# Patient Record
Sex: Male | Born: 1965 | Race: White | Hispanic: No | Smoking: Current every day smoker
Health system: Southern US, Community
[De-identification: ages and names within clinical notes are randomized; demographics above are authoritative.]

## PROBLEM LIST (undated history)

## (undated) DIAGNOSIS — I469 Cardiac arrest, cause unspecified: Secondary | ICD-10-CM

## (undated) DIAGNOSIS — I739 Peripheral vascular disease, unspecified: Secondary | ICD-10-CM

## (undated) DIAGNOSIS — F1010 Alcohol abuse, uncomplicated: Secondary | ICD-10-CM

## (undated) DIAGNOSIS — G7000 Myasthenia gravis without (acute) exacerbation: Secondary | ICD-10-CM

## (undated) DIAGNOSIS — K922 Gastrointestinal hemorrhage, unspecified: Secondary | ICD-10-CM

## (undated) DIAGNOSIS — D696 Thrombocytopenia, unspecified: Secondary | ICD-10-CM

## (undated) DIAGNOSIS — F1410 Cocaine abuse, uncomplicated: Secondary | ICD-10-CM

## (undated) DIAGNOSIS — K7200 Acute and subacute hepatic failure without coma: Secondary | ICD-10-CM

## (undated) DIAGNOSIS — K219 Gastro-esophageal reflux disease without esophagitis: Secondary | ICD-10-CM

## (undated) DIAGNOSIS — D649 Anemia, unspecified: Secondary | ICD-10-CM

## (undated) DIAGNOSIS — K2980 Duodenitis without bleeding: Secondary | ICD-10-CM

## (undated) DIAGNOSIS — E876 Hypokalemia: Secondary | ICD-10-CM

## (undated) DIAGNOSIS — I2510 Atherosclerotic heart disease of native coronary artery without angina pectoris: Secondary | ICD-10-CM

## (undated) DIAGNOSIS — I4901 Ventricular fibrillation: Secondary | ICD-10-CM

## (undated) DIAGNOSIS — I509 Heart failure, unspecified: Secondary | ICD-10-CM

## (undated) DIAGNOSIS — F1910 Other psychoactive substance abuse, uncomplicated: Secondary | ICD-10-CM

## (undated) DIAGNOSIS — G4730 Sleep apnea, unspecified: Secondary | ICD-10-CM

## (undated) DIAGNOSIS — D509 Iron deficiency anemia, unspecified: Secondary | ICD-10-CM

## (undated) DIAGNOSIS — I219 Acute myocardial infarction, unspecified: Secondary | ICD-10-CM

## (undated) DIAGNOSIS — E785 Hyperlipidemia, unspecified: Secondary | ICD-10-CM

## (undated) DIAGNOSIS — Z9289 Personal history of other medical treatment: Secondary | ICD-10-CM

## (undated) DIAGNOSIS — I255 Ischemic cardiomyopathy: Secondary | ICD-10-CM

## (undated) DIAGNOSIS — N179 Acute kidney failure, unspecified: Secondary | ICD-10-CM

## (undated) DIAGNOSIS — Z720 Tobacco use: Secondary | ICD-10-CM

## (undated) DIAGNOSIS — J449 Chronic obstructive pulmonary disease, unspecified: Secondary | ICD-10-CM

## (undated) DIAGNOSIS — K5520 Angiodysplasia of colon without hemorrhage: Secondary | ICD-10-CM

## (undated) HISTORY — PX: CORONARY ANGIOPLASTY: SHX604

## (undated) HISTORY — PX: ATHERECTOMY: SHX47

## (undated) HISTORY — PX: FEMORAL ARTERY - POPLITEAL ARTERY BYPASS GRAFT: SUR180

---

## 2018-04-26 DIAGNOSIS — D696 Thrombocytopenia, unspecified: Secondary | ICD-10-CM

## 2018-04-26 HISTORY — DX: Thrombocytopenia, unspecified: D69.6

## 2018-05-22 ENCOUNTER — Encounter (HOSPITAL_COMMUNITY)
Admission: EM | Disposition: A | Discharge status: Home Health | Payer: Self-pay | Source: Home / Self Care | Attending: Cardiovascular Disease

## 2018-05-22 ENCOUNTER — Inpatient Hospital Stay (HOSPITAL_COMMUNITY): Payer: No Typology Code available for payment source

## 2018-05-22 ENCOUNTER — Inpatient Hospital Stay (HOSPITAL_COMMUNITY)
Admission: EM | Admit: 2018-05-22 | Discharge: 2018-06-21 | DRG: 246 | Disposition: A | Discharge status: Home Health | Payer: No Typology Code available for payment source | Attending: Internal Medicine | Admitting: Internal Medicine

## 2018-05-22 ENCOUNTER — Emergency Department (HOSPITAL_COMMUNITY): Payer: No Typology Code available for payment source

## 2018-05-22 DIAGNOSIS — R34 Anuria and oliguria: Secondary | ICD-10-CM | POA: Diagnosis not present

## 2018-05-22 DIAGNOSIS — E875 Hyperkalemia: Secondary | ICD-10-CM | POA: Diagnosis not present

## 2018-05-22 DIAGNOSIS — I5041 Acute combined systolic (congestive) and diastolic (congestive) heart failure: Secondary | ICD-10-CM | POA: Diagnosis not present

## 2018-05-22 DIAGNOSIS — I2109 ST elevation (STEMI) myocardial infarction involving other coronary artery of anterior wall: Secondary | ICD-10-CM | POA: Diagnosis present

## 2018-05-22 DIAGNOSIS — F17210 Nicotine dependence, cigarettes, uncomplicated: Secondary | ICD-10-CM | POA: Diagnosis present

## 2018-05-22 DIAGNOSIS — G9340 Encephalopathy, unspecified: Secondary | ICD-10-CM

## 2018-05-22 DIAGNOSIS — Z681 Body mass index (BMI) 19 or less, adult: Secondary | ICD-10-CM | POA: Diagnosis not present

## 2018-05-22 DIAGNOSIS — N2581 Secondary hyperparathyroidism of renal origin: Secondary | ICD-10-CM | POA: Diagnosis not present

## 2018-05-22 DIAGNOSIS — I729 Aneurysm of unspecified site: Secondary | ICD-10-CM | POA: Diagnosis not present

## 2018-05-22 DIAGNOSIS — E785 Hyperlipidemia, unspecified: Secondary | ICD-10-CM

## 2018-05-22 DIAGNOSIS — E43 Unspecified severe protein-calorie malnutrition: Secondary | ICD-10-CM | POA: Diagnosis not present

## 2018-05-22 DIAGNOSIS — J9601 Acute respiratory failure with hypoxia: Secondary | ICD-10-CM | POA: Diagnosis present

## 2018-05-22 DIAGNOSIS — E874 Mixed disorder of acid-base balance: Secondary | ICD-10-CM | POA: Diagnosis present

## 2018-05-22 DIAGNOSIS — I132 Hypertensive heart and chronic kidney disease with heart failure and with stage 5 chronic kidney disease, or end stage renal disease: Secondary | ICD-10-CM | POA: Diagnosis present

## 2018-05-22 DIAGNOSIS — D6489 Other specified anemias: Secondary | ICD-10-CM | POA: Diagnosis not present

## 2018-05-22 DIAGNOSIS — R059 Cough, unspecified: Secondary | ICD-10-CM

## 2018-05-22 DIAGNOSIS — I2510 Atherosclerotic heart disease of native coronary artery without angina pectoris: Secondary | ICD-10-CM | POA: Diagnosis present

## 2018-05-22 DIAGNOSIS — G7000 Myasthenia gravis without (acute) exacerbation: Secondary | ICD-10-CM | POA: Diagnosis present

## 2018-05-22 DIAGNOSIS — N170 Acute kidney failure with tubular necrosis: Secondary | ICD-10-CM | POA: Diagnosis present

## 2018-05-22 DIAGNOSIS — R64 Cachexia: Secondary | ICD-10-CM | POA: Diagnosis not present

## 2018-05-22 DIAGNOSIS — R570 Cardiogenic shock: Secondary | ICD-10-CM | POA: Diagnosis present

## 2018-05-22 DIAGNOSIS — Z781 Physical restraint status: Secondary | ICD-10-CM

## 2018-05-22 DIAGNOSIS — E162 Hypoglycemia, unspecified: Secondary | ICD-10-CM | POA: Diagnosis not present

## 2018-05-22 DIAGNOSIS — J1520 Pneumonia due to staphylococcus, unspecified: Secondary | ICD-10-CM | POA: Diagnosis present

## 2018-05-22 DIAGNOSIS — F10239 Alcohol dependence with withdrawal, unspecified: Secondary | ICD-10-CM | POA: Diagnosis not present

## 2018-05-22 DIAGNOSIS — R05 Cough: Secondary | ICD-10-CM

## 2018-05-22 DIAGNOSIS — I2102 ST elevation (STEMI) myocardial infarction involving left anterior descending coronary artery: Principal | ICD-10-CM | POA: Diagnosis present

## 2018-05-22 DIAGNOSIS — I213 ST elevation (STEMI) myocardial infarction of unspecified site: Secondary | ICD-10-CM

## 2018-05-22 DIAGNOSIS — I249 Acute ischemic heart disease, unspecified: Secondary | ICD-10-CM | POA: Diagnosis present

## 2018-05-22 DIAGNOSIS — F1423 Cocaine dependence with withdrawal: Secondary | ICD-10-CM | POA: Diagnosis not present

## 2018-05-22 DIAGNOSIS — D62 Acute posthemorrhagic anemia: Secondary | ICD-10-CM | POA: Diagnosis not present

## 2018-05-22 DIAGNOSIS — Z452 Encounter for adjustment and management of vascular access device: Secondary | ICD-10-CM

## 2018-05-22 DIAGNOSIS — Z992 Dependence on renal dialysis: Secondary | ICD-10-CM

## 2018-05-22 DIAGNOSIS — R7989 Other specified abnormal findings of blood chemistry: Secondary | ICD-10-CM

## 2018-05-22 DIAGNOSIS — I255 Ischemic cardiomyopathy: Secondary | ICD-10-CM

## 2018-05-22 DIAGNOSIS — N179 Acute kidney failure, unspecified: Secondary | ICD-10-CM

## 2018-05-22 DIAGNOSIS — E861 Hypovolemia: Secondary | ICD-10-CM | POA: Diagnosis present

## 2018-05-22 DIAGNOSIS — R52 Pain, unspecified: Secondary | ICD-10-CM

## 2018-05-22 DIAGNOSIS — G9341 Metabolic encephalopathy: Secondary | ICD-10-CM | POA: Diagnosis not present

## 2018-05-22 DIAGNOSIS — G931 Anoxic brain damage, not elsewhere classified: Secondary | ICD-10-CM | POA: Diagnosis present

## 2018-05-22 DIAGNOSIS — A419 Sepsis, unspecified organism: Secondary | ICD-10-CM | POA: Diagnosis not present

## 2018-05-22 DIAGNOSIS — I4901 Ventricular fibrillation: Secondary | ICD-10-CM | POA: Diagnosis not present

## 2018-05-22 DIAGNOSIS — R739 Hyperglycemia, unspecified: Secondary | ICD-10-CM | POA: Diagnosis present

## 2018-05-22 DIAGNOSIS — N19 Unspecified kidney failure: Secondary | ICD-10-CM

## 2018-05-22 DIAGNOSIS — D684 Acquired coagulation factor deficiency: Secondary | ICD-10-CM | POA: Diagnosis not present

## 2018-05-22 DIAGNOSIS — J440 Chronic obstructive pulmonary disease with acute lower respiratory infection: Secondary | ICD-10-CM | POA: Diagnosis present

## 2018-05-22 DIAGNOSIS — R195 Other fecal abnormalities: Secondary | ICD-10-CM

## 2018-05-22 DIAGNOSIS — J9600 Acute respiratory failure, unspecified whether with hypoxia or hypercapnia: Secondary | ICD-10-CM

## 2018-05-22 DIAGNOSIS — I469 Cardiac arrest, cause unspecified: Secondary | ICD-10-CM

## 2018-05-22 DIAGNOSIS — R6521 Severe sepsis with septic shock: Secondary | ICD-10-CM | POA: Diagnosis not present

## 2018-05-22 DIAGNOSIS — T508X5A Adverse effect of diagnostic agents, initial encounter: Secondary | ICD-10-CM | POA: Diagnosis not present

## 2018-05-22 DIAGNOSIS — D6959 Other secondary thrombocytopenia: Secondary | ICD-10-CM | POA: Diagnosis not present

## 2018-05-22 DIAGNOSIS — I340 Nonrheumatic mitral (valve) insufficiency: Secondary | ICD-10-CM | POA: Diagnosis not present

## 2018-05-22 DIAGNOSIS — Z01810 Encounter for preprocedural cardiovascular examination: Secondary | ICD-10-CM | POA: Diagnosis not present

## 2018-05-22 DIAGNOSIS — Z01818 Encounter for other preprocedural examination: Secondary | ICD-10-CM

## 2018-05-22 DIAGNOSIS — N185 Chronic kidney disease, stage 5: Secondary | ICD-10-CM | POA: Diagnosis not present

## 2018-05-22 DIAGNOSIS — D696 Thrombocytopenia, unspecified: Secondary | ICD-10-CM

## 2018-05-22 DIAGNOSIS — K449 Diaphragmatic hernia without obstruction or gangrene: Secondary | ICD-10-CM | POA: Diagnosis present

## 2018-05-22 DIAGNOSIS — R471 Dysarthria and anarthria: Secondary | ICD-10-CM | POA: Diagnosis present

## 2018-05-22 DIAGNOSIS — R9401 Abnormal electroencephalogram [EEG]: Secondary | ICD-10-CM | POA: Diagnosis present

## 2018-05-22 DIAGNOSIS — T380X5A Adverse effect of glucocorticoids and synthetic analogues, initial encounter: Secondary | ICD-10-CM | POA: Diagnosis not present

## 2018-05-22 DIAGNOSIS — Z9289 Personal history of other medical treatment: Secondary | ICD-10-CM

## 2018-05-22 DIAGNOSIS — J189 Pneumonia, unspecified organism: Secondary | ICD-10-CM

## 2018-05-22 DIAGNOSIS — K7200 Acute and subacute hepatic failure without coma: Secondary | ICD-10-CM | POA: Diagnosis not present

## 2018-05-22 DIAGNOSIS — R579 Shock, unspecified: Secondary | ICD-10-CM

## 2018-05-22 DIAGNOSIS — I252 Old myocardial infarction: Secondary | ICD-10-CM

## 2018-05-22 DIAGNOSIS — F1410 Cocaine abuse, uncomplicated: Secondary | ICD-10-CM

## 2018-05-22 DIAGNOSIS — R945 Abnormal results of liver function studies: Secondary | ICD-10-CM

## 2018-05-22 DIAGNOSIS — K2980 Duodenitis without bleeding: Secondary | ICD-10-CM | POA: Diagnosis not present

## 2018-05-22 DIAGNOSIS — K921 Melena: Secondary | ICD-10-CM

## 2018-05-22 DIAGNOSIS — D500 Iron deficiency anemia secondary to blood loss (chronic): Secondary | ICD-10-CM

## 2018-05-22 DIAGNOSIS — E876 Hypokalemia: Secondary | ICD-10-CM | POA: Diagnosis not present

## 2018-05-22 DIAGNOSIS — I219 Acute myocardial infarction, unspecified: Secondary | ICD-10-CM

## 2018-05-22 DIAGNOSIS — T82528A Displacement of other cardiac and vascular devices and implants, initial encounter: Secondary | ICD-10-CM

## 2018-05-22 DIAGNOSIS — J9690 Respiratory failure, unspecified, unspecified whether with hypoxia or hypercapnia: Secondary | ICD-10-CM

## 2018-05-22 DIAGNOSIS — F1910 Other psychoactive substance abuse, uncomplicated: Secondary | ICD-10-CM

## 2018-05-22 DIAGNOSIS — I080 Rheumatic disorders of both mitral and aortic valves: Secondary | ICD-10-CM | POA: Diagnosis present

## 2018-05-22 HISTORY — DX: Cardiac arrest, cause unspecified: I46.9

## 2018-05-22 HISTORY — PX: CARDIAC CATHETERIZATION: SHX172

## 2018-05-22 HISTORY — PX: LEFT HEART CATH AND CORONARY ANGIOGRAPHY: CATH118249

## 2018-05-22 HISTORY — DX: Acute myocardial infarction, unspecified: I21.9

## 2018-05-22 HISTORY — PX: CORONARY STENT INTERVENTION: CATH118234

## 2018-05-22 LAB — POCT I-STAT 3, ART BLOOD GAS (G3+)
ACID-BASE DEFICIT: 8 mmol/L — AB (ref 0.0–2.0)
BICARBONATE: 17.7 mmol/L — AB (ref 20.0–28.0)
O2 Saturation: 86 %
PH ART: 7.292 — AB (ref 7.350–7.450)
TCO2: 19 mmol/L — AB (ref 22–32)
pCO2 arterial: 36.3 mmHg (ref 32.0–48.0)
pO2, Arterial: 54 mmHg — ABNORMAL LOW (ref 83.0–108.0)

## 2018-05-22 LAB — CBC
HEMATOCRIT: 42.1 % (ref 39.0–52.0)
Hemoglobin: 13 g/dL (ref 13.0–17.0)
MCH: 27 pg (ref 26.0–34.0)
MCHC: 30.9 g/dL (ref 30.0–36.0)
MCV: 87.5 fL (ref 78.0–100.0)
PLATELETS: 341 10*3/uL (ref 150–400)
RBC: 4.81 MIL/uL (ref 4.22–5.81)
RDW: 18.8 % — AB (ref 11.5–15.5)
WBC: 14.7 10*3/uL — AB (ref 4.0–10.5)

## 2018-05-22 LAB — URINALYSIS, ROUTINE W REFLEX MICROSCOPIC
BILIRUBIN URINE: NEGATIVE
Bacteria, UA: NONE SEEN
GLUCOSE, UA: NEGATIVE mg/dL
Ketones, ur: NEGATIVE mg/dL
Leukocytes, UA: NEGATIVE
Nitrite: NEGATIVE
PROTEIN: 30 mg/dL — AB
Specific Gravity, Urine: >1.046 — ABNORMAL HIGH (ref 1.005–1.030)
pH: 5 (ref 5.0–8.0)

## 2018-05-22 LAB — LIPID PANEL
Cholesterol: 125 mg/dL (ref 0–200)
HDL: 40 mg/dL — ABNORMAL LOW
LDL Cholesterol: 74 mg/dL (ref 0–99)
Total CHOL/HDL Ratio: 3.1 ratio
Triglycerides: 57 mg/dL
VLDL: 11 mg/dL (ref 0–40)

## 2018-05-22 LAB — BASIC METABOLIC PANEL WITH GFR
Anion gap: 16 — ABNORMAL HIGH (ref 5–15)
BUN: 18 mg/dL (ref 6–20)
CO2: 14 mmol/L — ABNORMAL LOW (ref 22–32)
Calcium: 8.2 mg/dL — ABNORMAL LOW (ref 8.9–10.3)
Chloride: 109 mmol/L (ref 98–111)
Creatinine, Ser: 1.23 mg/dL (ref 0.61–1.24)
GFR calc Af Amer: 30 mL/min — ABNORMAL LOW
GFR calc non Af Amer: 26 mL/min — ABNORMAL LOW
Glucose, Bld: 186 mg/dL — ABNORMAL HIGH (ref 70–99)
Potassium: 4.5 mmol/L (ref 3.5–5.1)
Sodium: 139 mmol/L (ref 135–145)

## 2018-05-22 LAB — MRSA PCR SCREENING: MRSA by PCR: NEGATIVE

## 2018-05-22 LAB — BASIC METABOLIC PANEL
Anion gap: 11 (ref 5–15)
BUN: 20 mg/dL (ref 6–20)
CALCIUM: 8.3 mg/dL — AB (ref 8.9–10.3)
CO2: 19 mmol/L — ABNORMAL LOW (ref 22–32)
Chloride: 108 mmol/L (ref 98–111)
Creatinine, Ser: 0.99 mg/dL (ref 0.61–1.24)
GFR calc Af Amer: 38 mL/min — ABNORMAL LOW (ref >60)
GFR, EST NON AFRICAN AMERICAN: 33 mL/min — AB (ref >60)
GLUCOSE: 188 mg/dL — AB (ref 70–99)
POTASSIUM: 3.9 mmol/L (ref 3.5–5.1)
SODIUM: 138 mmol/L (ref 135–145)

## 2018-05-22 LAB — POCT I-STAT, CHEM 8
BUN: 21 mg/dL — ABNORMAL HIGH (ref 6–20)
CALCIUM ION: 1.19 mmol/L (ref 1.15–1.40)
CHLORIDE: 107 mmol/L (ref 98–111)
Creatinine, Ser: 0.9 mg/dL (ref 0.61–1.24)
GLUCOSE: 187 mg/dL — AB (ref 70–99)
HCT: 46 % (ref 39.0–52.0)
Hemoglobin: 15.6 g/dL (ref 13.0–17.0)
Potassium: 4.3 mmol/L (ref 3.5–5.1)
SODIUM: 140 mmol/L (ref 135–145)
TCO2: 19 mmol/L — ABNORMAL LOW (ref 22–32)

## 2018-05-22 LAB — LACTIC ACID, PLASMA: Lactic Acid, Venous: 2.8 mmol/L (ref 0.5–1.9)

## 2018-05-22 LAB — APTT
aPTT: 30 s (ref 24–36)
aPTT: 122 seconds — ABNORMAL HIGH (ref 24–36)

## 2018-05-22 LAB — PROTIME-INR
INR: 1.07
INR: 5.53
PROTHROMBIN TIME: 49.9 s — AB (ref 11.4–15.2)
Prothrombin Time: 13.8 s (ref 11.4–15.2)

## 2018-05-22 LAB — GLUCOSE, CAPILLARY: GLUCOSE-CAPILLARY: 124 mg/dL — AB (ref 70–99)

## 2018-05-22 LAB — TROPONIN I
TROPONIN I: 1.19 ng/mL — AB (ref <0.03)
Troponin I: 0.11 ng/mL (ref <0.03)

## 2018-05-22 LAB — TRIGLYCERIDES: Triglycerides: 59 mg/dL (ref <150)

## 2018-05-22 LAB — HEMOGLOBIN A1C
HEMOGLOBIN A1C: 5.7 % — AB (ref 4.8–5.6)
Mean Plasma Glucose: 116.89 mg/dL

## 2018-05-22 LAB — TSH: TSH: 1.044 u[IU]/mL (ref 0.350–4.500)

## 2018-05-22 LAB — PROCALCITONIN: Procalcitonin: 0.14 ng/mL

## 2018-05-22 LAB — T4, FREE: Free T4: 1.01 ng/dL (ref 0.82–1.77)

## 2018-05-22 LAB — I-STAT TROPONIN, ED: Troponin i, poc: 0.11 ng/mL (ref 0.00–0.08)

## 2018-05-22 LAB — PHOSPHORUS: PHOSPHORUS: 4.1 mg/dL (ref 2.5–4.6)

## 2018-05-22 LAB — MAGNESIUM: Magnesium: 2 mg/dL (ref 1.7–2.4)

## 2018-05-22 LAB — BRAIN NATRIURETIC PEPTIDE: B Natriuretic Peptide: 276.5 pg/mL — ABNORMAL HIGH (ref 0.0–100.0)

## 2018-05-22 SURGERY — LEFT HEART CATH AND CORONARY ANGIOGRAPHY
Anesthesia: LOCAL

## 2018-05-22 MED ORDER — FENTANYL CITRATE (PF) 100 MCG/2ML IJ SOLN
INTRAMUSCULAR | Status: DC | PRN
  Administered 2018-05-22: 100 ug via INTRAVENOUS
  Administered 2018-05-22: 50 ug via INTRAVENOUS

## 2018-05-22 MED ORDER — SODIUM CHLORIDE 0.9% FLUSH: 3.0000 mL | INTRAVENOUS | Status: DC | PRN

## 2018-05-22 MED ORDER — INSULIN ASPART 100 UNIT/ML ~~LOC~~ SOLN: 1.0000 [IU] | SUBCUTANEOUS | Status: DC

## 2018-05-22 MED ORDER — HEPARIN SODIUM (PORCINE) 5000 UNIT/ML IJ SOLN
INTRAMUSCULAR | Status: AC
Start: 2018-05-22 — End: 2018-05-23
  Filled 2018-05-22: qty 1

## 2018-05-22 MED ORDER — BIVALIRUDIN TRIFLUOROACETATE 250 MG IV SOLR
INTRAVENOUS | Status: AC
  Filled 2018-05-22: qty 250

## 2018-05-22 MED ORDER — LIDOCAINE HCL (PF) 1 % IJ SOLN
INTRAMUSCULAR | Status: AC
  Filled 2018-05-22: qty 30

## 2018-05-22 MED ORDER — ASPIRIN 300 MG RE SUPP: 300.0000 mg | RECTAL | Status: DC

## 2018-05-22 MED ORDER — SODIUM CHLORIDE 0.9 % IV SOLN: 1.7500 mg/kg/h | INTRAVENOUS | Status: DC

## 2018-05-22 MED ORDER — CISATRACURIUM BOLUS VIA INFUSION
0.0500 mg/kg | INTRAVENOUS | Status: DC | PRN
  Filled 2018-05-22: qty 4

## 2018-05-22 MED ORDER — ASPIRIN 81 MG PO CHEW: 324.0000 mg | CHEWABLE_TABLET | Freq: Once | ORAL | Status: DC

## 2018-05-22 MED ORDER — ACETAMINOPHEN 325 MG PO TABS: 650.0000 mg | ORAL_TABLET | ORAL | Status: DC | PRN

## 2018-05-22 MED ORDER — MIDAZOLAM HCL 2 MG/2ML IJ SOLN: 2.0000 mg | INTRAMUSCULAR | Status: DC | PRN

## 2018-05-22 MED ORDER — HYDRALAZINE HCL 20 MG/ML IJ SOLN: 5.0000 mg | INTRAMUSCULAR | Status: DC | PRN

## 2018-05-22 MED ORDER — SODIUM CHLORIDE 0.9 % IV SOLN
1.0000 mg/h | INTRAVENOUS | Status: DC
  Filled 2018-05-22: qty 10

## 2018-05-22 MED ORDER — SODIUM CHLORIDE 0.9 % IV SOLN
4.0000 ug/kg/min | INTRAVENOUS | Status: DC
  Filled 2018-05-22: qty 50

## 2018-05-22 MED ORDER — CISATRACURIUM BOLUS VIA INFUSION
0.0500 mg/kg | INTRAVENOUS | Status: DC | PRN
  Filled 2018-05-22: qty 5

## 2018-05-22 MED ORDER — MIDAZOLAM BOLUS VIA INFUSION
1.0000 mg | INTRAVENOUS | Status: DC | PRN
  Administered 2018-05-22: 1 mg via INTRAVENOUS
  Filled 2018-05-22: qty 1

## 2018-05-22 MED ORDER — FENTANYL 2500MCG IN NS 250ML (10MCG/ML) PREMIX INFUSION
100.0000 ug/h | INTRAVENOUS | Status: DC
  Administered 2018-05-23 (×2): 300 ug/h via INTRAVENOUS
  Filled 2018-05-22 (×3): qty 250

## 2018-05-22 MED ORDER — SODIUM CHLORIDE 0.9 % IV SOLN
250.0000 mL | INTRAVENOUS | Status: DC | PRN
  Administered 2018-05-24: 250 mL via INTRAVENOUS

## 2018-05-22 MED ORDER — ONDANSETRON HCL 4 MG/2ML IJ SOLN: 4.0000 mg | Freq: Four times a day (QID) | INTRAMUSCULAR | Status: DC | PRN

## 2018-05-22 MED ORDER — ASPIRIN EC 81 MG PO TBEC
81.0000 mg | DELAYED_RELEASE_TABLET | Freq: Every day | ORAL | Status: DC
  Administered 2018-05-23: 81 mg via ORAL
  Filled 2018-05-22: qty 1

## 2018-05-22 MED ORDER — TICAGRELOR 90 MG PO TABS: 90.0000 mg | ORAL_TABLET | Freq: Two times a day (BID) | ORAL | Status: DC

## 2018-05-22 MED ORDER — SODIUM CHLORIDE 0.9 % IV SOLN
0.5000 mg/h | INTRAVENOUS | Status: DC
  Administered 2018-05-22: 1 mg/h via INTRAVENOUS
  Filled 2018-05-22 (×2): qty 10

## 2018-05-22 MED ORDER — SODIUM CHLORIDE 0.9 % IV SOLN
4.0000 ug/kg/min | INTRAVENOUS | Status: AC
  Administered 2018-05-22: 4 ug/kg/min via INTRAVENOUS
  Filled 2018-05-22: qty 50

## 2018-05-22 MED ORDER — AMIODARONE HCL IN DEXTROSE 360-4.14 MG/200ML-% IV SOLN
30.0000 mg/h | INTRAVENOUS | Status: DC
  Administered 2018-05-23 (×3): 30 mg/h via INTRAVENOUS
  Filled 2018-05-22: qty 200

## 2018-05-22 MED ORDER — LABETALOL HCL 5 MG/ML IV SOLN: 10.0000 mg | INTRAVENOUS | Status: DC | PRN

## 2018-05-22 MED ORDER — MIDAZOLAM HCL 2 MG/2ML IJ SOLN
INTRAMUSCULAR | Status: AC
  Administered 2018-05-22: 2 mg
  Filled 2018-05-22: qty 2

## 2018-05-22 MED ORDER — ASPIRIN 300 MG RE SUPP
300.0000 mg | Freq: Once | RECTAL | Status: AC
  Administered 2018-05-22: 300 mg via RECTAL
  Filled 2018-05-22: qty 1

## 2018-05-22 MED ORDER — SODIUM CHLORIDE 0.9 % IV SOLN
INTRAVENOUS | Status: DC
  Administered 2018-05-23 – 2018-05-27 (×4): via INTRAVENOUS

## 2018-05-22 MED ORDER — NITROGLYCERIN 0.4 MG SL SUBL: 0.4000 mg | SUBLINGUAL_TABLET | SUBLINGUAL | Status: DC | PRN

## 2018-05-22 MED ORDER — SUCCINYLCHOLINE CHLORIDE 20 MG/ML IJ SOLN
INTRAMUSCULAR | Status: AC | PRN
  Administered 2018-05-22: 120 mg via INTRAVENOUS

## 2018-05-22 MED ORDER — PIPERACILLIN-TAZOBACTAM 3.375 G IVPB
3.3750 g | Freq: Three times a day (TID) | INTRAVENOUS | Status: DC
  Administered 2018-05-22 – 2018-05-25 (×8): 3.375 g via INTRAVENOUS
  Filled 2018-05-22 (×9): qty 50

## 2018-05-22 MED ORDER — SODIUM CHLORIDE 0.9 % IV SOLN: INTRAVENOUS | Status: DC

## 2018-05-22 MED ORDER — IOPAMIDOL (ISOVUE-370) INJECTION 76%
INTRAVENOUS | Status: AC
  Filled 2018-05-22: qty 125

## 2018-05-22 MED ORDER — FENTANYL 2500MCG IN NS 250ML (10MCG/ML) PREMIX INFUSION: 100.0000 ug/h | INTRAVENOUS | Status: DC

## 2018-05-22 MED ORDER — FENTANYL CITRATE (PF) 100 MCG/2ML IJ SOLN
50.0000 ug | Freq: Once | INTRAMUSCULAR | Status: DC
Start: 2018-05-22 — End: 2018-05-22

## 2018-05-22 MED ORDER — "THROMBI-PAD 3""X3"" EX PADS"
1.0000 | MEDICATED_PAD | Freq: Once | CUTANEOUS | Status: AC
  Administered 2018-05-23: 1 via TOPICAL
  Filled 2018-05-22: qty 1

## 2018-05-22 MED ORDER — PROPOFOL 1000 MG/100ML IV EMUL
0.0000 ug/kg/min | INTRAVENOUS | Status: DC
  Administered 2018-05-22: 30 ug/kg/min via INTRAVENOUS
  Filled 2018-05-22: qty 100

## 2018-05-22 MED ORDER — CANGRELOR TETRASODIUM 50 MG IV SOLR
INTRAVENOUS | Status: AC
  Filled 2018-05-22: qty 50

## 2018-05-22 MED ORDER — SODIUM CHLORIDE 0.9 % IV SOLN
INTRAVENOUS | Status: AC | PRN
  Administered 2018-05-22: 10 mL/h via INTRAVENOUS

## 2018-05-22 MED ORDER — LIDOCAINE HCL (PF) 1 % IJ SOLN
INTRAMUSCULAR | Status: DC | PRN
  Administered 2018-05-22: 30 mL

## 2018-05-22 MED ORDER — HEPARIN (PORCINE) IN NACL 1000-0.9 UT/500ML-% IV SOLN
INTRAVENOUS | Status: DC | PRN
  Administered 2018-05-22 (×2): 500 mL

## 2018-05-22 MED ORDER — ASPIRIN 81 MG PO CHEW: 324.0000 mg | CHEWABLE_TABLET | ORAL | Status: DC

## 2018-05-22 MED ORDER — AMIODARONE HCL IN DEXTROSE 360-4.14 MG/200ML-% IV SOLN
60.0000 mg/h | INTRAVENOUS | Status: DC
  Administered 2018-05-22: 60 mg/h via INTRAVENOUS
  Filled 2018-05-22 (×2): qty 200

## 2018-05-22 MED ORDER — FENTANYL CITRATE (PF) 100 MCG/2ML IJ SOLN: 100.0000 ug | INTRAMUSCULAR | Status: DC | PRN

## 2018-05-22 MED ORDER — FAMOTIDINE IN NACL 20-0.9 MG/50ML-% IV SOLN: 20.0000 mg | Freq: Two times a day (BID) | INTRAVENOUS | Status: DC

## 2018-05-22 MED ORDER — CHLORHEXIDINE GLUCONATE 0.12% ORAL RINSE (MEDLINE KIT)
15.0000 mL | Freq: Two times a day (BID) | OROMUCOSAL | Status: DC
  Administered 2018-05-22 – 2018-05-28 (×12): 15 mL via OROMUCOSAL

## 2018-05-22 MED ORDER — SODIUM CHLORIDE 0.9 % IV SOLN
INTRAVENOUS | Status: AC | PRN
  Administered 2018-05-22: 1.75 mg/kg/h via INTRAVENOUS

## 2018-05-22 MED ORDER — FENTANYL 2500MCG IN NS 250ML (10MCG/ML) PREMIX INFUSION
25.0000 ug/h | INTRAVENOUS | Status: DC
  Administered 2018-05-22: 100 ug/h via INTRAVENOUS
  Filled 2018-05-22: qty 250

## 2018-05-22 MED ORDER — PANTOPRAZOLE SODIUM 40 MG PO PACK
40.0000 mg | PACK | Freq: Every day | ORAL | Status: DC
  Administered 2018-05-23 – 2018-05-24 (×3): 40 mg
  Filled 2018-05-22 (×3): qty 20

## 2018-05-22 MED ORDER — SODIUM CHLORIDE 0.9 % IV SOLN
1.7500 mg/kg/h | INTRAVENOUS | Status: DC
  Filled 2018-05-22: qty 250

## 2018-05-22 MED ORDER — SODIUM CHLORIDE 0.9 % IV SOLN
1.0000 ug/kg/min | INTRAVENOUS | Status: DC
  Filled 2018-05-22: qty 20

## 2018-05-22 MED ORDER — NOREPINEPHRINE 4 MG/250ML-% IV SOLN
0.0000 ug/min | INTRAVENOUS | Status: DC
  Administered 2018-05-23: 2 ug/min via INTRAVENOUS
  Filled 2018-05-22 (×2): qty 250

## 2018-05-22 MED ORDER — CISATRACURIUM BOLUS VIA INFUSION
0.1000 mg/kg | Freq: Once | INTRAVENOUS | Status: AC
  Administered 2018-05-22: 9 mg via INTRAVENOUS
  Filled 2018-05-22: qty 9

## 2018-05-22 MED ORDER — TICAGRELOR 90 MG PO TABS
180.0000 mg | ORAL_TABLET | ORAL | Status: AC
  Administered 2018-05-22: 180 mg
  Filled 2018-05-22: qty 2

## 2018-05-22 MED ORDER — ASPIRIN 81 MG PO CHEW: 81.0000 mg | CHEWABLE_TABLET | Freq: Every day | ORAL | Status: DC

## 2018-05-22 MED ORDER — SODIUM CHLORIDE 0.9% FLUSH
3.0000 mL | Freq: Two times a day (BID) | INTRAVENOUS | Status: DC
  Administered 2018-05-23 – 2018-06-20 (×50): 3 mL via INTRAVENOUS

## 2018-05-22 MED ORDER — MIDAZOLAM HCL 2 MG/2ML IJ SOLN
INTRAMUSCULAR | Status: DC | PRN
  Administered 2018-05-22 (×3): 2 mg via INTRAVENOUS

## 2018-05-22 MED ORDER — ORAL CARE MOUTH RINSE
15.0000 mL | OROMUCOSAL | Status: DC
  Administered 2018-05-22 – 2018-05-28 (×56): 15 mL via OROMUCOSAL

## 2018-05-22 MED ORDER — ACETAMINOPHEN 650 MG RE SUPP: 650.0000 mg | Freq: Four times a day (QID) | RECTAL | Status: DC | PRN

## 2018-05-22 MED ORDER — HEPARIN (PORCINE) IN NACL 1000-0.9 UT/500ML-% IV SOLN
INTRAVENOUS | Status: AC
  Filled 2018-05-22: qty 1000

## 2018-05-22 MED ORDER — ACETAMINOPHEN 160 MG/5ML PO SOLN
650.0000 mg | Freq: Four times a day (QID) | ORAL | Status: DC | PRN
  Administered 2018-05-24: 650 mg
  Filled 2018-05-22: qty 20.3

## 2018-05-22 MED ORDER — FENTANYL CITRATE (PF) 100 MCG/2ML IJ SOLN
INTRAMUSCULAR | Status: AC
  Filled 2018-05-22: qty 2

## 2018-05-22 MED ORDER — SODIUM CHLORIDE 0.9 % IV SOLN
1.7500 mg/kg/h | INTRAVENOUS | Status: AC
  Administered 2018-05-22: 1.75 mg/kg/h via INTRAVENOUS
  Filled 2018-05-22 (×2): qty 250

## 2018-05-22 MED ORDER — FENTANYL CITRATE (PF) 100 MCG/2ML IJ SOLN
50.0000 ug | Freq: Once | INTRAMUSCULAR | Status: AC
  Administered 2018-05-22: 50 ug via INTRAVENOUS

## 2018-05-22 MED ORDER — MIDAZOLAM HCL 2 MG/2ML IJ SOLN: 2.0000 mg | Freq: Once | INTRAMUSCULAR | Status: DC

## 2018-05-22 MED ORDER — FENTANYL BOLUS VIA INFUSION
25.0000 ug | INTRAVENOUS | Status: DC | PRN
  Filled 2018-05-22: qty 25

## 2018-05-22 MED ORDER — VANCOMYCIN HCL IN DEXTROSE 750-5 MG/150ML-% IV SOLN
750.0000 mg | Freq: Two times a day (BID) | INTRAVENOUS | Status: DC
  Administered 2018-05-23 (×3): 750 mg via INTRAVENOUS
  Filled 2018-05-22 (×4): qty 150

## 2018-05-22 MED ORDER — ATORVASTATIN CALCIUM 80 MG PO TABS
80.0000 mg | ORAL_TABLET | Freq: Every day | ORAL | Status: DC
  Administered 2018-05-23 – 2018-05-24 (×2): 80 mg via ORAL
  Filled 2018-05-22 (×2): qty 1

## 2018-05-22 MED ORDER — BIVALIRUDIN BOLUS VIA INFUSION - CUPID
INTRAVENOUS | Status: DC | PRN
  Administered 2018-05-22 (×2): 68.03925 mg via INTRAVENOUS

## 2018-05-22 MED ORDER — NITROGLYCERIN 1 MG/10 ML FOR IR/CATH LAB
INTRA_ARTERIAL | Status: AC
  Filled 2018-05-22: qty 10

## 2018-05-22 MED ORDER — CANGRELOR BOLUS VIA INFUSION
INTRAVENOUS | Status: DC | PRN
  Administered 2018-05-22: 2721.57 ug via INTRAVENOUS

## 2018-05-22 MED ORDER — SODIUM BICARBONATE 8.4 % IV SOLN
100.0000 meq | Freq: Once | INTRAVENOUS | Status: AC
  Administered 2018-05-22: 100 meq via INTRAVENOUS
  Filled 2018-05-22: qty 50

## 2018-05-22 MED ORDER — PROPOFOL 1000 MG/100ML IV EMUL: 0.0000 ug/kg/min | INTRAVENOUS | Status: DC

## 2018-05-22 MED ORDER — SODIUM CHLORIDE 0.9 % IV SOLN
250.0000 mL | INTRAVENOUS | Status: DC | PRN
  Administered 2018-05-22 – 2018-05-24 (×2): 250 mL via INTRAVENOUS
  Administered 2018-05-27: 500 mL via INTRAVENOUS
  Administered 2018-06-05: 250 mL via INTRAVENOUS
  Administered 2018-06-10: 09:00:00 via INTRAVENOUS

## 2018-05-22 MED ORDER — HEPARIN SODIUM (PORCINE) 1000 UNIT/ML IJ SOLN
2000.0000 [IU] | Freq: Once | INTRAMUSCULAR | Status: DC
  Filled 2018-05-22: qty 2

## 2018-05-22 MED ORDER — TICAGRELOR 90 MG PO TABS
90.0000 mg | ORAL_TABLET | Freq: Two times a day (BID) | ORAL | Status: DC
  Administered 2018-05-23 – 2018-06-11 (×37): 90 mg
  Filled 2018-05-22 (×40): qty 1

## 2018-05-22 MED ORDER — MIDAZOLAM HCL 2 MG/2ML IJ SOLN: 1.0000 mg | Freq: Once | INTRAMUSCULAR | Status: DC

## 2018-05-22 MED ORDER — ETOMIDATE 2 MG/ML IV SOLN
INTRAVENOUS | Status: AC | PRN
  Administered 2018-05-22: 30 mg via INTRAVENOUS

## 2018-05-22 MED ORDER — TICAGRELOR 90 MG PO TABS: 180.0000 mg | ORAL_TABLET | Freq: Once | ORAL | Status: DC

## 2018-05-22 MED ORDER — SODIUM CHLORIDE 0.9 % IV SOLN
INTRAVENOUS | Status: AC | PRN
  Administered 2018-05-22: 4 ug/kg/min via INTRAVENOUS

## 2018-05-22 MED ORDER — SODIUM CHLORIDE 0.9 % IV SOLN
1.0000 ug/kg/min | INTRAVENOUS | Status: DC
  Administered 2018-05-22: 1 ug/kg/min via INTRAVENOUS
  Filled 2018-05-22: qty 20

## 2018-05-22 MED ORDER — DOCUSATE SODIUM 50 MG/5ML PO LIQD: 100.0000 mg | Freq: Two times a day (BID) | ORAL | Status: DC | PRN

## 2018-05-22 MED ORDER — ARTIFICIAL TEARS OPHTHALMIC OINT
1.0000 "application " | TOPICAL_OINTMENT | Freq: Three times a day (TID) | OPHTHALMIC | Status: DC
  Administered 2018-05-22 – 2018-05-24 (×6): 1 via OPHTHALMIC
  Filled 2018-05-22: qty 3.5

## 2018-05-22 MED ORDER — MIDAZOLAM HCL 50 MG/10ML IJ SOLN
2.0000 mg/h | INTRAMUSCULAR | Status: DC
  Administered 2018-05-23: 3 mg/h via INTRAVENOUS
  Filled 2018-05-22 (×2): qty 10

## 2018-05-22 SURGICAL SUPPLY — 16 items
BALLN SAPPHIRE 2.0X12 (BALLOONS) ×2
BALLN SAPPHIRE ~~LOC~~ 3.25X15 (BALLOONS) ×2 IMPLANT
BALLN SAPPHIRE ~~LOC~~ 3.5X15 (BALLOONS) ×2 IMPLANT
BALLOON SAPPHIRE 2.0X12 (BALLOONS) ×1 IMPLANT
CATH INFINITI 5FR MULTPACK ANG (CATHETERS) ×2 IMPLANT
CATH VISTA GUIDE 6FR XBLAD3.5 (CATHETERS) ×2 IMPLANT
KIT ENCORE 26 ADVANTAGE (KITS) ×2 IMPLANT
KIT HEART LEFT (KITS) ×2 IMPLANT
PACK CARDIAC CATHETERIZATION (CUSTOM PROCEDURE TRAY) ×2 IMPLANT
SHEATH PINNACLE 6F 10CM (SHEATH) ×2 IMPLANT
STENT SYNERGY DES 3X20 (Permanent Stent) ×2 IMPLANT
SYR MEDRAD MARK V 150ML (SYRINGE) ×2 IMPLANT
TRANSDUCER W/STOPCOCK (MISCELLANEOUS) ×2 IMPLANT
TUBING CIL FLEX 10 FLL-RA (TUBING) ×2 IMPLANT
WIRE ASAHI PROWATER 180CM (WIRE) ×2 IMPLANT
WIRE EMERALD 3MM-J .035X150CM (WIRE) ×2 IMPLANT

## 2018-05-22 NOTE — Sedation Documentation (Signed)
Pt being bagged now

## 2018-05-22 NOTE — Procedures (Signed)
Central Venous Catheter Insertion Procedure Note  Procedure: Insertion of Central Venous Catheter  Indications:  vascular access  Procedure Details  Informed consent was obtained for the procedure, including sedation.  Risks of lung perforation, hemorrhage, arrhythmia, and adverse drug reaction were discussed.   Maximum sterile technique was used including antiseptics, cap, gloves, gown, hand hygiene, mask and sheet.  Under sterile conditions the skin above the on the left internal jugular vein was prepped with betadine and covered with a sterile drape. Local anesthesia was applied to the skin and subcutaneous tissues. A 22-gauge needle was used to identify the vein. An 18-gauge needle was then inserted into the vein. A guide wire was then passed easily through the catheter. There were no arrhythmias. The catheter was then withdrawn. A triple-lumen was then inserted into the vessel over the guide wire. The catheter was sutured into place.  Findings: There were no changes to vital signs. Catheter was flushed with 20 cc NS. Patient did tolerate procedure well.  Recommendations: CXR ordered to verify placement.

## 2018-05-22 NOTE — Progress Notes (Signed)
  Amiodarone Drug - Drug Interaction Consult Note  Recommendations: -No DDIs noted  Amiodarone is metabolized by the cytochrome P450 system and therefore has the potential to cause many drug interactions. Amiodarone has an average plasma half-life of 50 days (range 20 to 100 days).   There is potential for drug interactions to occur several weeks or months after stopping treatment and the onset of drug interactions may be slow after initiating amiodarone.   []  Statins: Increased risk of myopathy. Simvastatin- restrict dose to 20mg  daily. Other statins: counsel patients to report any muscle pain or weakness immediately.  []  Anticoagulants: Amiodarone can increase anticoagulant effect. Consider warfarin dose reduction. Patients should be monitored closely and the dose of anticoagulant altered accordingly, remembering that amiodarone levels take several weeks to stabilize.  []  Antiepileptics: Amiodarone can increase plasma concentration of phenytoin, the dose should be reduced. Note that small changes in phenytoin dose can result in large changes in levels. Monitor patient and counsel on signs of toxicity.  []  Beta blockers: increased risk of bradycardia, AV block and myocardial depression. Sotalol - avoid concomitant use.  []   Calcium channel blockers (diltiazem and verapamil): increased risk of bradycardia, AV block and myocardial depression.  []   Cyclosporine: Amiodarone increases levels of cyclosporine. Reduced dose of cyclosporine is recommended.  []  Digoxin dose should be halved when amiodarone is started.  []  Diuretics: increased risk of cardiotoxicity if hypokalemia occurs.  []  Oral hypoglycemic agents (glyburide, glipizide, glimepiride): increased risk of hypoglycemia. Patient's glucose levels should be monitored closely when initiating amiodarone therapy.   []  Drugs that prolong the QT interval:  Torsades de pointes risk may be increased with concurrent use - avoid if possible.   Monitor QTc, also keep magnesium/potassium WNL if concurrent therapy can't be avoided. Marland Kitchen Antibiotics: e.g. fluoroquinolones, erythromycin. . Antiarrhythmics: e.g. quinidine, procainamide, disopyramide, sotalol. . Antipsychotics: e.g. phenothiazines, haloperidol.  . Lithium, tricyclic antidepressants, and methadone. Thank Concha Pyo  05/22/2018 8:50 PM

## 2018-05-22 NOTE — Progress Notes (Signed)
ABG with improved oxygenation but worsened hypercapnea, corresponding to patient now being paralyzed. Will increase RR from 20 to 26. Asked RN to pass this request on to RT. Repeat ABG at 1am. Still has a metabolic acidosis; hasn't received the 1 amp Bicarb ordered earlier. RN says she will give it now.

## 2018-05-22 NOTE — Progress Notes (Addendum)
Pharmacy Antibiotic Note  Howard Thompson is a 52 y.o. male admitted on 05/22/2018 with STEMI s/p cardiopulmonary arrest.  Pharmacy has been consulted for Zosyn and vancomycin dosing with concern for aspiration.  Plan: Zosyn 3.375g IV q8h (4 hour infusion).  Vancomycin 750mg  IV q12h Monitor CrCl, LOT, cultures  Weight: 198 lb 6.6 oz (90 kg)  Temp (24hrs), Avg:98.6 F (37 C), Min:98.6 F (37 C), Max:98.6 F (37 C)  Recent Labs  Lab 05/22/18 1744 05/22/18 2003  WBC 14.7*  --   CREATININE 1.23* 0.90    CrCl cannot be calculated (Unknown ideal weight.).    Allergies not on file  Antimicrobials this admission: 7/27 Zosyn >> 7/27 Vancomycin >>  Dose adjustments this admission: none  Microbiology results: ordered  Thank you for allowing pharmacy to be a part of this patient's care.  Arrie Senate, PharmD, BCPS Clinical Pharmacist [phone] Please check AMION for all [facility] numbers 05/22/2018

## 2018-05-22 NOTE — ED Notes (Signed)
Per ems pt was found on ground and in v-fib, 2 rounds of epi given, 300 mg amiodarone, 4 mg narcan given. Pt was shocked twice and now sinus tach. Pt unresponsive to any external stimuli

## 2018-05-22 NOTE — Progress Notes (Signed)
Received patient post CPR in field. Intubated in ED and transported to CT then CATH lab.

## 2018-05-22 NOTE — ED Notes (Signed)
Critical lab reported to charge RN Santiago Glad)

## 2018-05-22 NOTE — H&P (Addendum)
History & Physical    Patient ID: Howard Thompson MRN: [id number], DOB/AGE: 51/31/1875   Admit date: 05/22/2018   Primary Physician: No primary care provider on file. Primary Cardiologist: No primary care provider on file.  Patient Profile    Status post cardiac arrest with SPECT at STEMI  Past Medical History   The patient's real name is Howard Thompson, birthdate April 1867. he has a past medical history of myasthenia gravis status post thymus resection, and MI that he had several years ago that was not treated with PCI, hypertension on unknown medical therapy.  His history is obtained from family that arrived at the hospital.  Per report the patient was found down next to his mailbox this afternoon, suspected downtime to initiation of CPR by driver who came by the house was about 10 minutes.  The initial presenting rhythm was V. fib.  Patient was shocked 4 Times.  Amio 300 mg was given.  Per reports from his neighbors patient has complained about chest pain today and the last few days and has brought her nitroglycerin from his neighbor.  Patient currently in a stressful environment, is undergoing divorce from his wife and had family arrived from [place] today.  Thus he has been working very hard to last week trying to get the house ready for their arrival.  Per family patient has been using cocaine.  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  On arrival to the ED patient has been in sinus rhythm for 1 hour.  There was no recurrence of mental status.  Patient is moaning.  Vitals have been stable throughout transport with a tachycardia and rates of 100s and blood pressure 110/50.  Presenting ECG with ST elevation in lateral leads. . Allergies None known   History of Present Illness    Myasthenia gravis Myocardial infarction Hypertension  Home Medications    Prior to Admission medications   Not on File    Family History    Unknown  Social History  Cocaine  use  Review of Systems    All other systems reviewed and are otherwise negative except as noted above.  Physical Exam    Blood pressure (!) 141/81, pulse (!) 101, resp. rate 14, SpO2 97 %.  General: Intubated and sedated Psych: Cannot be assessed Neuro: Intubated and sedated HEENT: Normal  Neck: Supple without bruits or JVD. Lungs: Flail chest with atypical breathing Heart: Tachycardic no murmurs Abdomen: Soft, non-tender, non-distended, BS + x 4.  Extremities: No clubbing, cyanosis or edema. DP/PT/Radials 2+ and equal bilaterally.  Labs    Troponin (Point of Care Test) No results for input(s): TROPIPOC in the last 72 hours. No results for input(s): CKTOTAL, CKMB, TROPONINI in the last 72 hours. No results found for: WBC, HGB, HCT, MCV, PLT No results for input(s): NA, K, CL, CO2, BUN, CREATININE, CALCIUM, PROT, BILITOT, ALKPHOS, ALT, AST, GLUCOSE in the last 168 hours.  Invalid input(s): LABALBU No results found for: CHOL, HDL, LDLCALC, TRIG No results found for: [facility]   Radiology Studies    No results found.  ECG & Cardiac Imaging    ST elevation lateral leads, sinus tach  Assessment & Plan    Cardiac arrest: Status post cardiac arrest presumed in the setting of ST elevation MI.  Past medical history significant hypertension, cocaine use, and MI.  Plan: -Sedation was cooling - CT head then heparin and ASA -Cath Lab to evaluate for coronary lesion -Admit to ICU from there  and appreciate intensivist support with the patient's future care  Signed, Cristina Gong, MD 05/22/2018, 5:54 PM

## 2018-05-22 NOTE — Consult Note (Signed)
PULMONARY / CRITICAL CARE MEDICINE   Name: Howard Thompson MRN: [id number] DOB: 10/27/1874    ADMISSION DATE:  05/22/2018 CONSULTATION DATE:  05/22/2018  REFERRING MD:  Dr. Gwenlyn Found   CHIEF COMPLAINT:  STEMI   HISTORY OF PRESENT ILLNESS:   Male with known cocaine use, h/o of MI, HTN, and MG.  Presents to ED after being found down at mailbox. Bystander reportedly performed 10 minutes CPR before EMS arrived. When EMS arrived patient was in V.Fib. Shocked x 4. Given Amiodarone. EKG with ST elevation. Taken to Cath Lab found to have 100% Stenosis to Ost LAD to Prox LAD, Drug-eluting stent placement. Post-Cath patient undergoing hypothermia protocol. PCCM asked to consult.   PAST MEDICAL HISTORY :  She  has no past medical history on file.  PAST SURGICAL HISTORY: She  has no past surgical history on file.  Allergies not on file  No current facility-administered medications on file prior to encounter.    No current outpatient medications on file prior to encounter.    FAMILY HISTORY:  Her family history is not on file.  SOCIAL HISTORY: She    REVIEW OF SYSTEMS:   Unable to review as patient is intubated and sedated.   SUBJECTIVE:   VITAL SIGNS: BP (!) 141/81   Pulse 96   Temp 98.6 F (37 C) (Tympanic)   Resp (!) 27   Wt 90 kg (198 lb 6.6 oz)   SpO2 100%   HEMODYNAMICS:    VENTILATOR SETTINGS: Vent Mode: PCV FiO2 (%):  [60 %-100 %] 100 % Set Rate:  [18 bmp-20 bmp] 20 bmp Vt Set:  [560 mL] 560 mL PEEP:  [5 cmH20] 5 cmH20 Plateau Pressure:  [17 cmH20-26 cmH20] 17 cmH20  INTAKE / OUTPUT: No intake/output data recorded.  PHYSICAL EXAMINATION: General:  Older Adult male, on vent  Neuro:  Flexion of upper extremities with stimulation, Pupils intact, does not follow commands/does not open eyes  HEENT:  ETT in place > with dark brown sputum  Cardiovascular:  RRR, no MRG  Lungs:  Diminished, Rhonchi, no wheeze/crackles Abdomen:  Non-distended, active bowel sounds   Musculoskeletal:  -edema  Skin:  Warm, dry   LABS:  BMET Recent Labs  Lab 05/22/18 1744 05/22/18 2003  NA 139 140  K 4.5 4.3  CL 109 107  CO2 14*  --   BUN 18 21  CREATININE 1.23* 0.90  GLUCOSE 186* 187*    Electrolytes Recent Labs  Lab 05/22/18 1744  CALCIUM 8.2*    CBC Recent Labs  Lab 05/22/18 1744 05/22/18 2003  WBC 14.7*  --   HGB 13.0 15.6*  HCT 42.1 46.0  PLT 341  --     Coag's Recent Labs  Lab 05/22/18 1744  APTT 30  INR 1.07    Sepsis Markers No results for input(s): LATICACIDVEN, PROCALCITON, O2SATVEN in the last 168 hours.  ABG Recent Labs  Lab 05/22/18 2010  PHART 7.292*  PCO2ART 36.3  PO2ART 54.0*    Liver Enzymes No results for input(s): AST, ALT, ALKPHOS, BILITOT, ALBUMIN in the last 168 hours.  Cardiac Enzymes Recent Labs  Lab 05/22/18 1744  TROPONINI 0.11*    Glucose No results for input(s): GLUCAP in the last 168 hours.  Imaging Ct Head Wo Contrast  Result Date: 05/22/2018 CLINICAL DATA:  Status post CPR EXAM: CT HEAD WITHOUT CONTRAST CT CERVICAL SPINE WITHOUT CONTRAST TECHNIQUE: Multidetector CT imaging of the head and cervical spine was performed following the standard protocol without intravenous  contrast. Multiplanar CT image reconstructions of the cervical spine were also generated. COMPARISON:  None. FINDINGS: CT HEAD FINDINGS Brain: No acute intracranial abnormality. Specifically, no hemorrhage, hydrocephalus, mass lesion, acute infarction, or significant intracranial injury. Vascular: No hyperdense vessel or unexpected calcification. Skull: No acute calvarial abnormality. Sinuses/Orbits: Mucosal thickening throughout the paranasal sinuses. Opacified scattered ethmoid air cells. Other: None CT CERVICAL SPINE FINDINGS Alignment: Normal Skull base and vertebrae: No fracture Soft tissues and spinal canal: Prevertebral soft tissues are normal. No epidural or paraspinal hematoma. Disc levels: Slight disc space narrowing at  C5-6 and C6-7. Mild degenerative facet disease bilaterally. Upper chest: Mild emphysema in the apices. Other: No acute findings IMPRESSION: No acute intracranial abnormality. No acute bony abnormality in the cervical spine. Electronically Signed   By: Rolm Baptise M.D.   On: 05/22/2018 18:22   Ct Cervical Spine Wo Contrast  Result Date: 05/22/2018 CLINICAL DATA:  Status post CPR EXAM: CT HEAD WITHOUT CONTRAST CT CERVICAL SPINE WITHOUT CONTRAST TECHNIQUE: Multidetector CT imaging of the head and cervical spine was performed following the standard protocol without intravenous contrast. Multiplanar CT image reconstructions of the cervical spine were also generated. COMPARISON:  None. FINDINGS: CT HEAD FINDINGS Brain: No acute intracranial abnormality. Specifically, no hemorrhage, hydrocephalus, mass lesion, acute infarction, or significant intracranial injury. Vascular: No hyperdense vessel or unexpected calcification. Skull: No acute calvarial abnormality. Sinuses/Orbits: Mucosal thickening throughout the paranasal sinuses. Opacified scattered ethmoid air cells. Other: None CT CERVICAL SPINE FINDINGS Alignment: Normal Skull base and vertebrae: No fracture Soft tissues and spinal canal: Prevertebral soft tissues are normal. No epidural or paraspinal hematoma. Disc levels: Slight disc space narrowing at C5-6 and C6-7. Mild degenerative facet disease bilaterally. Upper chest: Mild emphysema in the apices. Other: No acute findings IMPRESSION: No acute intracranial abnormality. No acute bony abnormality in the cervical spine. Electronically Signed   By: Rolm Baptise M.D.   On: 05/22/2018 18:22   STUDIES:  CT Head 7/27 > Negative  CT C Spine 7/27 > Negative  CXR 7/27 > Endotracheal tube tip is at the level of the clavicular heads. The orogastric tube tip and side port project over the stomach. There are moderate bilateral parahilar opacities. Lungs are otherwise clear.  CULTURES: Blood 7/27 >> Sputum 7/27  >> U/A 7/27 >>  ANTIBIOTICS: Zosyn 7/27 >>  SIGNIFICANT EVENTS: 7/27 > Presents to ED   LINES/TUBES: ETT 7/27 >> Right Femoral Arterial Sheath 7/27 >>  DISCUSSION: Male with known cocaine use and h/o of MI presents to ED after being found down at [place]. Bystander reportedly performed 10 minutes CPR before EMS arrived. When EMS arrived patient was in V.Fib. Shocked x 4. Given Amiodarone. EKG with ST elevation. Taken to Cath Lab found to have 100% Stenosis to Ost LAD to Prox LAD, Drug-eluting stent placement. Post-Cath patient undergoing hypothermia protocol.   ASSESSMENT / PLAN:  Acute Hypoxic Respiratory Failure in setting of suspected Aspiration PNA ?, bilateral parahilar opacities concerning for pulmonary edema   Plan  -Vent Support -Trend ABG/CXR -Pulmonary Hygiene  -VAP Bundle   STEMI, VFib Cardiac Arrest s/p Cath -100% Stenosis to Ost LAD to Prox LAD s/p Drug-eluting stent placement  -Estimated EF <25% with severe Left Ventricular Systolic Dysfunction  H/O MI (Not treated with PCI, HTN Plan  -Per Cardiology  -Cardiac Monitoring  -Maintain Systolic <301 -Hypothermia Protocol (33)  -Brilinta/ASA  -PRN Labetalol, Hydralazine -ECHO pending   -Continue Amiodarone gtt   Leukocytosis in setting of aspiration event vs reactive  Plan  -Trend WBC and Fever Curve -Trend PCT -PAN Culture  -Zosyn to cover for suspected aspiration   Acute Encephalopathy in setting of Anoxic Injury vs Hypoperfusion/Hypoxia  -Head CT Negative 7/27 > Negative  H/O Cocaine Use, Myasthenia Gravis  Plan  -RASS goal -5 -Propofol/Fentanyl/Versed gtt  -EEG pending  -UDS pending   FAMILY  - Updates: Family updated on plan of care.   - Inter-disciplinary family meet or Palliative Care meeting due by: 05/29/2018   Hayden Pedro, AGACNP-BC [facility]  Pgr: [phone]  PCCM Pgr: [phone]

## 2018-05-22 NOTE — ED Provider Notes (Signed)
[facility] EMERGENCY DEPARTMENT Provider Note   CSN: [id number] Arrival date & time: 05/22/18  1718     History   Chief Complaint Chief Complaint  Patient presents with  . Fall    HPI Howard Thompson is a 52 y.o. male.  Patient is a white male coming in today with paramedics after being found down at his mailbox from an unknown amount of time.  When fire arrived and pads were placed patient was found to be in V. fib.  He received 3 shocks with and approximately 10 to 15 minutes of CPR prior to paramedics arriving.  When paramedics arrived patient continued to be in V. fib and was given 2 rounds of epinephrine and was shocked a fourth time with return of spontaneous circulation and normal sinus rhythm.  Patient was also given 300 mg of amiodarone in route.  Blood sugars were within normal limits.  Patient had been breathing spontaneously but did not regain consciousness.  He does slightly localize to pain but again has not spoken.  Patient was given Narcan without significant improvement in his symptoms.  It was reported that he has had a prior CABG and had been having some chest pain for an unknown amount of time and had borrowed a friend's nitroglycerin.  EKG in route was concerning and a code STEMI was called prior to the patient's arrival.  The history is provided by the EMS personnel. The history is limited by the condition of the patient.    No past medical history on file.  There are no active problems to display for this patient.      OB History   None      Home Medications    Prior to Admission medications   Not on File    Family History No family history on file.  Social History Social History   Tobacco Use  . Smoking status: Not on file  Substance Use Topics  . Alcohol use: Not on file  . Drug use: Not on file     Allergies   Patient has no allergy information on record.   Review of Systems Review of Systems  Unable to  perform ROS: Acuity of condition     Physical Exam Updated Vital Signs BP (!) 141/81   Pulse (!) 101   Resp 14   SpO2 99%   Physical Exam  Constitutional: She appears well-developed and well-nourished.  HENT:  Head: Normocephalic and atraumatic.  Mouth/Throat: Oropharynx is clear and moist.  Bite block was in with small laceration to the tongue and minimal emesis in the mouth  Eyes: Pupils are equal, round, and reactive to light. Conjunctivae and EOM are normal.  90mm and reactive bilaterally  Neck: Normal range of motion. Neck supple.  Cardiovascular: Regular rhythm and intact distal pulses. Tachycardia present.  No murmur heard. Pulmonary/Chest: Effort normal. No respiratory distress. She has no wheezes. She has rhonchi. She has no rales.  Midline sternotomy scar with mild bruising from CPR  Abdominal: Soft. She exhibits no distension. There is no tenderness. There is no rebound and no guarding.  Musculoskeletal: Normal range of motion. She exhibits no edema or tenderness.  Neurological: She is unresponsive.  Pt will localized to pain with the right arm.  Will not follow commands or open his eyes.  Does moan  Skin: Skin is warm and dry. No rash noted. No erythema.  Psychiatric:  Unresponsive with occassional moaning.  Nursing note and vitals reviewed.  ED Treatments / Results  Labs (all labs ordered are listed, but only abnormal results are displayed) Labs Reviewed  CBC - Abnormal; Notable for the following components:      Result Value   WBC 14.7 (*)    RDW 18.8 (*)    All other components within normal limits  I-STAT TROPONIN, ED - Abnormal; Notable for the following components:   Troponin i, poc 0.11 (*)    All other components within normal limits  PROTIME-INR  APTT  BASIC METABOLIC PANEL  TROPONIN I  LIPID PANEL    EKG EKG Interpretation  Date/Time:  Saturday May 22 2018 17:27:02 EDT Ventricular Rate:  108 PR Interval:    QRS Duration: 88 QT  Interval:  312 QTC Calculation: 419 R Axis:   66 Text Interpretation:  Sinus tachycardia Biatrial enlargement Anteroseptal infarct, old Borderline repolarization abnormality Baseline wander in lead(s) V5 V6 No previous tracing Confirmed by Blanchie Dessert [phone]) on 05/22/2018 6:02:48 PM   Radiology Ct Head Wo Contrast  Result Date: 05/22/2018 CLINICAL DATA:  Status post CPR EXAM: CT HEAD WITHOUT CONTRAST CT CERVICAL SPINE WITHOUT CONTRAST TECHNIQUE: Multidetector CT imaging of the head and cervical spine was performed following the standard protocol without intravenous contrast. Multiplanar CT image reconstructions of the cervical spine were also generated. COMPARISON:  None. FINDINGS: CT HEAD FINDINGS Brain: No acute intracranial abnormality. Specifically, no hemorrhage, hydrocephalus, mass lesion, acute infarction, or significant intracranial injury. Vascular: No hyperdense vessel or unexpected calcification. Skull: No acute calvarial abnormality. Sinuses/Orbits: Mucosal thickening throughout the paranasal sinuses. Opacified scattered ethmoid air cells. Other: None CT CERVICAL SPINE FINDINGS Alignment: Normal Skull base and vertebrae: No fracture Soft tissues and spinal canal: Prevertebral soft tissues are normal. No epidural or paraspinal hematoma. Disc levels: Slight disc space narrowing at C5-6 and C6-7. Mild degenerative facet disease bilaterally. Upper chest: Mild emphysema in the apices. Other: No acute findings IMPRESSION: No acute intracranial abnormality. No acute bony abnormality in the cervical spine. Electronically Signed   By: Rolm Baptise M.D.   On: 05/22/2018 18:22   Ct Cervical Spine Wo Contrast  Result Date: 05/22/2018 CLINICAL DATA:  Status post CPR EXAM: CT HEAD WITHOUT CONTRAST CT CERVICAL SPINE WITHOUT CONTRAST TECHNIQUE: Multidetector CT imaging of the head and cervical spine was performed following the standard protocol without intravenous contrast. Multiplanar CT image  reconstructions of the cervical spine were also generated. COMPARISON:  None. FINDINGS: CT HEAD FINDINGS Brain: No acute intracranial abnormality. Specifically, no hemorrhage, hydrocephalus, mass lesion, acute infarction, or significant intracranial injury. Vascular: No hyperdense vessel or unexpected calcification. Skull: No acute calvarial abnormality. Sinuses/Orbits: Mucosal thickening throughout the paranasal sinuses. Opacified scattered ethmoid air cells. Other: None CT CERVICAL SPINE FINDINGS Alignment: Normal Skull base and vertebrae: No fracture Soft tissues and spinal canal: Prevertebral soft tissues are normal. No epidural or paraspinal hematoma. Disc levels: Slight disc space narrowing at C5-6 and C6-7. Mild degenerative facet disease bilaterally. Upper chest: Mild emphysema in the apices. Other: No acute findings IMPRESSION: No acute intracranial abnormality. No acute bony abnormality in the cervical spine. Electronically Signed   By: Rolm Baptise M.D.   On: 05/22/2018 18:22    Procedures Procedure Name: Intubation Date/Time: 05/22/2018 6:52 PM Performed by: Blanchie Dessert, MD Pre-anesthesia Checklist: Patient identified Oxygen Delivery Method: Ambu bag Preoxygenation: Pre-oxygenation with 100% oxygen Ventilation: Mask ventilation without difficulty Laryngoscope Size: Glidescope and 4 Grade View: Grade III Tube size: 7.5 mm Number of attempts: 1 Airway Equipment and Method: Video-laryngoscopy Placement  Confirmation: ETT inserted through vocal cords under direct vision,  Positive ETCO2,  CO2 detector and Breath sounds checked- equal and bilateral Secured at: 23 cm Tube secured with: ETT holder Dental Injury: Teeth and Oropharynx as per pre-operative assessment  Difficulty Due To: Difficulty was unanticipated      (including critical care time)  Medications Ordered in ED Medications  aspirin suppository 300 mg (has no administration in time range)  heparin injection 2,000  Units (has no administration in time range)  heparin 5000 UNIT/ML injection (has no administration in time range)  midazolam (VERSED) 50 mg in sodium chloride 0.9 % 50 mL (1 mg/mL) infusion (has no administration in time range)  0.9 %  sodium chloride infusion (has no administration in time range)  aspirin chewable tablet 324 mg (has no administration in time range)  etomidate (AMIDATE) injection (30 mg Intravenous Given 05/22/18 1735)  succinylcholine (ANECTINE) injection (120 mg Intravenous Given 05/22/18 1736)     Initial Impression / Assessment and Plan / ED Course  I have reviewed the triage vital signs and the nursing notes.  Pertinent labs & imaging results that were available during my care of the patient were reviewed by me and considered in my medical decision making (see chart for details).     Elderly male with supposed heart history due to sternotomy scar presenting today as a postarrest code STEMI.  Patient was found down by his mailbox in V. fib arrest.  After 4 shocks, epinephrine return of spontaneous circulation was achieved.  Patient was given amiodarone.  Upon arrival here a definitive airway was placed without difficulty.  Oxygen saturation, blood pressure and heart rate remained stable.  Based on out of hospital EKG, patient's story of recently having chest pain and burning nitroglycerin from a friend and his prior history Dr. Alvester Chou felt that patient was a candidate for the Cath Lab.  Also critical care was consulted and it was felt that patient would also be a candidate for cooling.  Cooling pads were placed, heparin was held until it was instructed that patient did not have any injury to his head with a negative head CT and neck CT.  Also rectal aspirin was given.  CRITICAL CARE Performed by: Donshay Lupinski Total critical care time: 30 minutes Critical care time was exclusive of separately billable procedures and treating other patients. Critical care was necessary to  treat or prevent imminent or life-threatening deterioration. Critical care was time spent personally by me on the following activities: development of treatment plan with patient and/or surrogate as well as nursing, discussions with consultants, evaluation of patient's response to treatment, examination of patient, obtaining history from patient or surrogate, ordering and performing treatments and interventions, ordering and review of laboratory studies, ordering and review of radiographic studies, pulse oximetry and re-evaluation of patient's condition.  Final Clinical Impressions(s) / ED Diagnoses   Final diagnoses:  Ventricular fibrillation [facility])  Cardiac arrest ([place])  ST elevation myocardial infarction (STEMI), unspecified artery [facility])    ED Discharge Orders    None       Blanchie Dessert, MD 05/22/18 1857

## 2018-05-22 NOTE — Progress Notes (Signed)
Pt transported from Cath lab to [place] 14 without event.  RT will monitor.

## 2018-05-22 NOTE — Progress Notes (Signed)
eLink Physician-Brief Progress Note Patient Name: Howard Thompson DOB: 15-Dec-1965 MRN: [id number]   Date of Service  05/22/2018  HPI/Events of Note  Watery diarrhea X 3. Request for Flexiseal.   eICU Interventions  Will order Flexiseal placed.      Intervention Category Major Interventions: Other:  Lysle Dingwall 05/22/2018, 11:49 PM

## 2018-05-23 ENCOUNTER — Inpatient Hospital Stay (HOSPITAL_COMMUNITY): Payer: No Typology Code available for payment source

## 2018-05-23 ENCOUNTER — Encounter (HOSPITAL_COMMUNITY): Payer: Self-pay | Admitting: Cardiovascular Disease

## 2018-05-23 DIAGNOSIS — I361 Nonrheumatic tricuspid (valve) insufficiency: Secondary | ICD-10-CM

## 2018-05-23 DIAGNOSIS — G931 Anoxic brain damage, not elsewhere classified: Secondary | ICD-10-CM

## 2018-05-23 DIAGNOSIS — R9431 Abnormal electrocardiogram [ECG] [EKG]: Secondary | ICD-10-CM

## 2018-05-23 DIAGNOSIS — R4020 Unspecified coma: Secondary | ICD-10-CM

## 2018-05-23 DIAGNOSIS — I213 ST elevation (STEMI) myocardial infarction of unspecified site: Secondary | ICD-10-CM

## 2018-05-23 DIAGNOSIS — R6521 Severe sepsis with septic shock: Secondary | ICD-10-CM

## 2018-05-23 DIAGNOSIS — I351 Nonrheumatic aortic (valve) insufficiency: Secondary | ICD-10-CM

## 2018-05-23 DIAGNOSIS — A419 Sepsis, unspecified organism: Secondary | ICD-10-CM

## 2018-05-23 DIAGNOSIS — R570 Cardiogenic shock: Secondary | ICD-10-CM

## 2018-05-23 LAB — BLOOD GAS, ARTERIAL
Acid-base deficit: 8.3 mmol/L — ABNORMAL HIGH (ref 0.0–2.0)
Acid-base deficit: 4.7 mmol/L — ABNORMAL HIGH (ref 0.0–2.0)
BICARBONATE: 17.3 mmol/L — AB (ref 20.0–28.0)
Bicarbonate: 20.1 mmol/L (ref 20.0–28.0)
Drawn by: 414221
FIO2: 100
FIO2: 70
MECHVT: 580 mL
O2 Saturation: 98.7 %
O2 Saturation: 97 %
PATIENT TEMPERATURE: 98.6
PCO2 ART: 37.9 mmHg (ref 32.0–48.0)
PEEP/CPAP: 14 cmH2O
PEEP: 12 cmH2O
Patient temperature: 98.6
RATE: 26 resp/min
RATE: 26 resp/min
VT: 580 mL
pCO2 arterial: 39 mmHg (ref 32.0–48.0)
pH, Arterial: 7.269 — ABNORMAL LOW (ref 7.350–7.450)
pH, Arterial: 7.343 — ABNORMAL LOW (ref 7.350–7.450)
pO2, Arterial: 199 mmHg — ABNORMAL HIGH (ref 83.0–108.0)
pO2, Arterial: 104 mmHg (ref 83.0–108.0)

## 2018-05-23 LAB — POCT I-STAT 3, ART BLOOD GAS (G3+)
ACID-BASE DEFICIT: 10 mmol/L — AB (ref 0.0–2.0)
Acid-base deficit: 7 mmol/L — ABNORMAL HIGH (ref 0.0–2.0)
Acid-base deficit: 4 mmol/L — ABNORMAL HIGH (ref 0.0–2.0)
BICARBONATE: 23.1 mmol/L (ref 20.0–28.0)
Bicarbonate: 17.5 mmol/L — ABNORMAL LOW (ref 20.0–28.0)
Bicarbonate: 18.3 mmol/L — ABNORMAL LOW (ref 20.0–28.0)
O2 SAT: 98 %
O2 Saturation: 97 %
O2 Saturation: 59 %
PH ART: 7.232 — AB (ref 7.350–7.450)
PO2 ART: 73 mmHg — AB (ref 83.0–108.0)
PO2 ART: 108 mmHg (ref 83.0–108.0)
Patient temperature: 33.1
Patient temperature: 32.3
TCO2: 19 mmol/L — AB (ref 22–32)
TCO2: 20 mmol/L — ABNORMAL LOW (ref 22–32)
TCO2: 25 mmol/L (ref 22–32)
pCO2 arterial: 27.9 mmHg — ABNORMAL LOW (ref 32.0–48.0)
pCO2 arterial: 41.3 mmHg (ref 32.0–48.0)
pCO2 arterial: 42.9 mmHg (ref 32.0–48.0)
pH, Arterial: 7.388 (ref 7.350–7.450)
pH, Arterial: 7.315 — ABNORMAL LOW (ref 7.350–7.450)
pO2, Arterial: 29 mmHg — CL (ref 83.0–108.0)

## 2018-05-23 LAB — BASIC METABOLIC PANEL
Anion gap: 11 (ref 5–15)
Anion gap: 11 (ref 5–15)
Anion gap: 11 (ref 5–15)
Anion gap: 13 (ref 5–15)
Anion gap: 13 (ref 5–15)
Anion gap: 16 — ABNORMAL HIGH (ref 5–15)
BUN: 24 mg/dL — AB (ref 6–20)
BUN: 22 mg/dL — AB (ref 6–20)
BUN: 23 mg/dL — AB (ref 6–20)
BUN: 25 mg/dL — AB (ref 6–20)
BUN: 25 mg/dL — ABNORMAL HIGH (ref 6–20)
BUN: 27 mg/dL — AB (ref 6–20)
CALCIUM: 8 mg/dL — AB (ref 8.9–10.3)
CALCIUM: 7.3 mg/dL — AB (ref 8.9–10.3)
CALCIUM: 7.3 mg/dL — AB (ref 8.9–10.3)
CHLORIDE: 109 mmol/L (ref 98–111)
CHLORIDE: 111 mmol/L (ref 98–111)
CHLORIDE: 111 mmol/L (ref 98–111)
CHLORIDE: 112 mmol/L — AB (ref 98–111)
CHLORIDE: 111 mmol/L (ref 98–111)
CHLORIDE: 108 mmol/L (ref 98–111)
CO2: 22 mmol/L (ref 22–32)
CO2: 20 mmol/L — AB (ref 22–32)
CO2: 19 mmol/L — AB (ref 22–32)
CO2: 16 mmol/L — AB (ref 22–32)
CO2: 17 mmol/L — AB (ref 22–32)
CO2: 21 mmol/L — AB (ref 22–32)
CREATININE: 1.14 mg/dL (ref 0.61–1.24)
CREATININE: 1.1 mg/dL (ref 0.61–1.24)
CREATININE: 1.53 mg/dL — AB (ref 0.61–1.24)
CREATININE: 1.81 mg/dL — AB (ref 0.61–1.24)
Calcium: 7.3 mg/dL — ABNORMAL LOW (ref 8.9–10.3)
Calcium: 7.4 mg/dL — ABNORMAL LOW (ref 8.9–10.3)
Calcium: 6.5 mg/dL — ABNORMAL LOW (ref 8.9–10.3)
Creatinine, Ser: 1.01 mg/dL (ref 0.61–1.24)
Creatinine, Ser: 1.25 mg/dL — ABNORMAL HIGH (ref 0.61–1.24)
GFR calc Af Amer: >60 mL/min (ref >60)
GFR calc Af Amer: >60 mL/min (ref >60)
GFR calc Af Amer: 48 mL/min — ABNORMAL LOW (ref >60)
GFR calc non Af Amer: >60 mL/min (ref >60)
GFR calc non Af Amer: >60 mL/min (ref >60)
GFR calc non Af Amer: >60 mL/min (ref >60)
GFR calc non Af Amer: >60 mL/min (ref >60)
GFR calc non Af Amer: 51 mL/min — ABNORMAL LOW (ref >60)
GFR calc non Af Amer: 42 mL/min — ABNORMAL LOW (ref >60)
GFR, EST AFRICAN AMERICAN: 59 mL/min — AB (ref >60)
Glucose, Bld: 176 mg/dL — ABNORMAL HIGH (ref 70–99)
Glucose, Bld: 141 mg/dL — ABNORMAL HIGH (ref 70–99)
Glucose, Bld: 154 mg/dL — ABNORMAL HIGH (ref 70–99)
Glucose, Bld: 131 mg/dL — ABNORMAL HIGH (ref 70–99)
Glucose, Bld: 86 mg/dL (ref 70–99)
Glucose, Bld: 154 mg/dL — ABNORMAL HIGH (ref 70–99)
POTASSIUM: 3.7 mmol/L (ref 3.5–5.1)
POTASSIUM: 5.4 mmol/L — AB (ref 3.5–5.1)
POTASSIUM: 5.6 mmol/L — AB (ref 3.5–5.1)
POTASSIUM: 3.1 mmol/L — AB (ref 3.5–5.1)
Potassium: 2.8 mmol/L — ABNORMAL LOW (ref 3.5–5.1)
Potassium: 3.3 mmol/L — ABNORMAL LOW (ref 3.5–5.1)
SODIUM: 142 mmol/L (ref 135–145)
SODIUM: 142 mmol/L (ref 135–145)
SODIUM: 141 mmol/L (ref 135–145)
SODIUM: 141 mmol/L (ref 135–145)
SODIUM: 141 mmol/L (ref 135–145)
Sodium: 145 mmol/L (ref 135–145)

## 2018-05-23 LAB — POCT I-STAT, CHEM 8
BUN: 30 mg/dL — ABNORMAL HIGH (ref 6–20)
BUN: 26 mg/dL — ABNORMAL HIGH (ref 6–20)
Calcium, Ion: 0.5 mmol/L — CL (ref 1.15–1.40)
Calcium, Ion: 1.18 mmol/L (ref 1.15–1.40)
Chloride: 104 mmol/L (ref 98–111)
Chloride: 109 mmol/L (ref 98–111)
Creatinine, Ser: 0.6 mg/dL — ABNORMAL LOW (ref 0.61–1.24)
Creatinine, Ser: 1 mg/dL (ref 0.61–1.24)
Glucose, Bld: 164 mg/dL — ABNORMAL HIGH (ref 70–99)
Glucose, Bld: 264 mg/dL — ABNORMAL HIGH (ref 70–99)
HEMATOCRIT: 37 % — AB (ref 39.0–52.0)
HEMATOCRIT: 43 % (ref 39.0–52.0)
HEMOGLOBIN: 12.6 g/dL — AB (ref 13.0–17.0)
HEMOGLOBIN: 14.6 g/dL (ref 13.0–17.0)
Potassium: 7.7 mmol/L (ref 3.5–5.1)
Potassium: 4.2 mmol/L (ref 3.5–5.1)
SODIUM: 140 mmol/L (ref 135–145)
SODIUM: 140 mmol/L (ref 135–145)
TCO2: 18 mmol/L — AB (ref 22–32)
TCO2: 19 mmol/L — AB (ref 22–32)

## 2018-05-23 LAB — GLUCOSE, CAPILLARY
GLUCOSE-CAPILLARY: 140 mg/dL — AB (ref 70–99)
GLUCOSE-CAPILLARY: 161 mg/dL — AB (ref 70–99)
GLUCOSE-CAPILLARY: 163 mg/dL — AB (ref 70–99)
GLUCOSE-CAPILLARY: 74 mg/dL (ref 70–99)
GLUCOSE-CAPILLARY: 93 mg/dL (ref 70–99)
Glucose-Capillary: 253 mg/dL — ABNORMAL HIGH (ref 70–99)
Glucose-Capillary: 191 mg/dL — ABNORMAL HIGH (ref 70–99)
Glucose-Capillary: 90 mg/dL (ref 70–99)
Glucose-Capillary: 131 mg/dL — ABNORMAL HIGH (ref 70–99)
Glucose-Capillary: 122 mg/dL — ABNORMAL HIGH (ref 70–99)
Glucose-Capillary: 136 mg/dL — ABNORMAL HIGH (ref 70–99)

## 2018-05-23 LAB — POCT I-STAT 4, (NA,K, GLUC, HGB,HCT)
GLUCOSE: 108 mg/dL — AB (ref 70–99)
HCT: 34 % — ABNORMAL LOW (ref 39.0–52.0)
Hemoglobin: 11.6 g/dL — ABNORMAL LOW (ref 13.0–17.0)
POTASSIUM: 3 mmol/L — AB (ref 3.5–5.1)
SODIUM: 147 mmol/L — AB (ref 135–145)

## 2018-05-23 LAB — PROTIME-INR
INR: 1.55
PROTHROMBIN TIME: 18.4 s — AB (ref 11.4–15.2)

## 2018-05-23 LAB — MAGNESIUM
MAGNESIUM: 2 mg/dL (ref 1.7–2.4)
Magnesium: 1.6 mg/dL — ABNORMAL LOW (ref 1.7–2.4)

## 2018-05-23 LAB — CBC
HCT: 42.1 % (ref 39.0–52.0)
HEMOGLOBIN: 13.4 g/dL (ref 13.0–17.0)
MCH: 26.7 pg (ref 26.0–34.0)
MCHC: 31.8 g/dL (ref 30.0–36.0)
MCV: 84 fL (ref 78.0–100.0)
Platelets: 272 10*3/uL (ref 150–400)
RBC: 5.01 MIL/uL (ref 4.22–5.81)
RDW: 18.6 % — ABNORMAL HIGH (ref 11.5–15.5)
WBC: 5.7 10*3/uL (ref 4.0–10.5)

## 2018-05-23 LAB — ECHOCARDIOGRAM COMPLETE
Height: 70 in
Weight: 2585.55 oz

## 2018-05-23 LAB — TROPONIN I
Troponin I: 2.05 ng/mL (ref <0.03)
Troponin I: 1.69 ng/mL (ref <0.03)
Troponin I: 1.74 ng/mL (ref <0.03)
Troponin I: 1.99 ng/mL (ref <0.03)

## 2018-05-23 LAB — RAPID URINE DRUG SCREEN, HOSP PERFORMED
Amphetamines: NOT DETECTED
BARBITURATES: NOT DETECTED
BENZODIAZEPINES: POSITIVE — AB
Cocaine: POSITIVE — AB
Opiates: NOT DETECTED
Tetrahydrocannabinol: NOT DETECTED

## 2018-05-23 LAB — COOXEMETRY PANEL
CARBOXYHEMOGLOBIN: 0.6 % (ref 0.5–1.5)
Methemoglobin: 1 % (ref 0.0–1.5)
O2 SAT: 45.6 %
TOTAL HEMOGLOBIN: 12.3 g/dL (ref 12.0–16.0)

## 2018-05-23 LAB — LACTIC ACID, PLASMA
LACTIC ACID, VENOUS: 10.2 mmol/L — AB (ref 0.5–1.9)
Lactic Acid, Venous: 2.3 mmol/L (ref 0.5–1.9)

## 2018-05-23 LAB — POCT ACTIVATED CLOTTING TIME
ACTIVATED CLOTTING TIME: 175 s
Activated Clotting Time: 191 seconds
Activated Clotting Time: 164 seconds

## 2018-05-23 LAB — PHOSPHORUS: Phosphorus: 2.6 mg/dL (ref 2.5–4.6)

## 2018-05-23 LAB — APTT: aPTT: 59 seconds — ABNORMAL HIGH (ref 24–36)

## 2018-05-23 MED ORDER — VASOPRESSIN 20 UNIT/ML IV SOLN
0.0400 [IU]/min | INTRAVENOUS | Status: DC
  Administered 2018-05-23: 0.03 [IU]/min via INTRAVENOUS
  Administered 2018-05-24: 0.04 [IU]/min via INTRAVENOUS
  Filled 2018-05-23 (×2): qty 2

## 2018-05-23 MED ORDER — SODIUM CHLORIDE 0.9 % IV SOLN: INTRAVENOUS | Status: DC | PRN

## 2018-05-23 MED ORDER — SODIUM BICARBONATE 8.4 % IV SOLN
100.0000 meq | Freq: Once | INTRAVENOUS | Status: AC
Start: 2018-05-23 — End: 2018-05-23
  Administered 2018-05-23: 100 meq via INTRAVENOUS
  Filled 2018-05-23: qty 50

## 2018-05-23 MED ORDER — ATROPINE SULFATE 1 MG/10ML IJ SOSY
PREFILLED_SYRINGE | INTRAMUSCULAR | Status: AC
  Filled 2018-05-23: qty 10

## 2018-05-23 MED ORDER — ALBUMIN HUMAN 25 % IV SOLN
25.0000 g | Freq: Once | INTRAVENOUS | Status: AC
  Administered 2018-05-23: 25 g via INTRAVENOUS
  Filled 2018-05-23: qty 100

## 2018-05-23 MED ORDER — DOBUTAMINE IN D5W 4-5 MG/ML-% IV SOLN
2.5000 ug/kg/min | INTRAVENOUS | Status: DC
Start: 2018-05-23 — End: 2018-05-23
  Administered 2018-05-23: 2.5 ug/kg/min via INTRAVENOUS
  Filled 2018-05-23: qty 250

## 2018-05-23 MED ORDER — NOREPINEPHRINE 16 MG/250ML-% IV SOLN
0.0000 ug/min | INTRAVENOUS | Status: DC
  Administered 2018-05-23: 44 ug/min via INTRAVENOUS
  Administered 2018-05-23: 46 ug/min via INTRAVENOUS
  Administered 2018-05-23: 40 ug/min via INTRAVENOUS
  Administered 2018-05-24: 6 ug/min via INTRAVENOUS
  Administered 2018-05-25: 12 ug/min via INTRAVENOUS
  Filled 2018-05-23 (×8): qty 250

## 2018-05-23 MED ORDER — EPINEPHRINE PF 1 MG/ML IJ SOLN
0.5000 ug/min | INTRAVENOUS | Status: DC
  Administered 2018-05-23: 5 ug/min via INTRAVENOUS
  Administered 2018-05-24 (×3): 20 ug/min via INTRAVENOUS
  Administered 2018-05-24: 14 ug/min via INTRAVENOUS
  Administered 2018-05-24: 20 ug/min via INTRAVENOUS
  Administered 2018-05-25: 4 ug/min via INTRAVENOUS
  Filled 2018-05-23 (×7): qty 4

## 2018-05-23 MED ORDER — SODIUM BICARBONATE 8.4 % IV SOLN
100.0000 meq | Freq: Once | INTRAVENOUS | Status: AC
  Administered 2018-05-23: 100 meq via INTRAVENOUS
  Filled 2018-05-23: qty 100

## 2018-05-23 MED ORDER — SODIUM BICARBONATE 8.4 % IV SOLN
50.0000 meq | Freq: Once | INTRAVENOUS | Status: AC
  Administered 2018-05-23: 50 meq via INTRAVENOUS

## 2018-05-23 MED ORDER — SODIUM CHLORIDE 0.9 % IV SOLN
2.0000 g | Freq: Once | INTRAVENOUS | Status: AC
  Administered 2018-05-23: 2 g via INTRAVENOUS
  Filled 2018-05-23: qty 20

## 2018-05-23 MED ORDER — INSULIN ASPART 100 UNIT/ML ~~LOC~~ SOLN
0.0000 [IU] | SUBCUTANEOUS | Status: DC
  Administered 2018-05-23: 1 [IU] via SUBCUTANEOUS
  Administered 2018-05-23: 2 [IU] via SUBCUTANEOUS
  Administered 2018-05-23: 1 [IU] via SUBCUTANEOUS
  Administered 2018-05-23: 2 [IU] via SUBCUTANEOUS
  Administered 2018-05-23 – 2018-05-24 (×4): 1 [IU] via SUBCUTANEOUS
  Administered 2018-05-26: 2 [IU] via SUBCUTANEOUS
  Administered 2018-05-27 – 2018-06-12 (×9): 1 [IU] via SUBCUTANEOUS

## 2018-05-23 MED ORDER — POTASSIUM CHLORIDE 10 MEQ/50ML IV SOLN
10.0000 meq | INTRAVENOUS | Status: AC
  Administered 2018-05-24 (×4): 10 meq via INTRAVENOUS
  Filled 2018-05-23 (×4): qty 50

## 2018-05-23 MED ORDER — EPINEPHRINE PF 1 MG/10ML IJ SOSY
3.0000 | PREFILLED_SYRINGE | Freq: Once | INTRAMUSCULAR | Status: AC
  Administered 2018-05-23: 3 mg via INTRAVENOUS

## 2018-05-23 MED ORDER — ASPIRIN 81 MG PO CHEW
81.0000 mg | CHEWABLE_TABLET | Freq: Once | ORAL | Status: AC
  Administered 2018-05-24: 81 mg via ORAL
  Filled 2018-05-23: qty 1

## 2018-05-23 MED ORDER — POTASSIUM CHLORIDE 10 MEQ/50ML IV SOLN: 10.0000 meq | INTRAVENOUS | Status: DC

## 2018-05-23 MED ORDER — SODIUM BICARBONATE 8.4 % IV SOLN
50.0000 meq | Freq: Once | INTRAVENOUS | Status: AC
  Administered 2018-05-23: 50 meq via INTRAVENOUS
  Filled 2018-05-23: qty 50

## 2018-05-23 MED ORDER — SODIUM CHLORIDE 0.9 % IV SOLN
1.0000 g | Freq: Once | INTRAVENOUS | Status: AC
  Administered 2018-05-24: 1 g via INTRAVENOUS
  Filled 2018-05-23: qty 10

## 2018-05-23 MED ORDER — SODIUM CHLORIDE 0.9% FLUSH
10.0000 mL | INTRAVENOUS | Status: DC | PRN
  Administered 2018-05-29: 10 mL
  Filled 2018-05-23: qty 40

## 2018-05-23 MED ORDER — SODIUM BICARBONATE 8.4 % IV SOLN
INTRAVENOUS | Status: DC
Start: 2018-05-23 — End: 2018-05-23
  Administered 2018-05-23: 05:00:00 via INTRAVENOUS
  Filled 2018-05-23 (×2): qty 850

## 2018-05-23 MED ORDER — LACTATED RINGERS IV BOLUS
1000.0000 mL | Freq: Once | INTRAVENOUS | Status: AC
  Administered 2018-05-23: 1000 mL via INTRAVENOUS

## 2018-05-23 MED ORDER — HYDROCORTISONE NA SUCCINATE PF 100 MG IJ SOLR
50.0000 mg | Freq: Four times a day (QID) | INTRAMUSCULAR | Status: DC
  Administered 2018-05-23 – 2018-05-25 (×6): 50 mg via INTRAVENOUS
  Filled 2018-05-23 (×6): qty 2

## 2018-05-23 MED ORDER — LACTATED RINGERS IV SOLN
INTRAVENOUS | Status: DC
  Administered 2018-05-23: 14:00:00 via INTRAVENOUS

## 2018-05-23 MED ORDER — POTASSIUM CHLORIDE 10 MEQ/50ML IV SOLN
10.0000 meq | INTRAVENOUS | Status: AC
  Administered 2018-05-23 (×6): 10 meq via INTRAVENOUS
  Filled 2018-05-23 (×6): qty 50

## 2018-05-23 MED ORDER — SODIUM CHLORIDE 0.9% FLUSH
10.0000 mL | Freq: Two times a day (BID) | INTRAVENOUS | Status: DC
  Administered 2018-05-23 – 2018-05-24 (×2): 10 mL
  Administered 2018-05-24 – 2018-05-25 (×2): 40 mL
  Administered 2018-05-25 – 2018-05-29 (×7): 10 mL
  Administered 2018-05-30: 20 mL
  Administered 2018-05-30 – 2018-06-01 (×3): 10 mL

## 2018-05-23 MED ORDER — CHLORHEXIDINE GLUCONATE CLOTH 2 % EX PADS
6.0000 | MEDICATED_PAD | Freq: Every day | CUTANEOUS | Status: DC
  Administered 2018-05-24 – 2018-06-05 (×13): 6 via TOPICAL

## 2018-05-23 MED ORDER — STERILE WATER FOR INJECTION IV SOLN
INTRAVENOUS | Status: DC
  Administered 2018-05-23 – 2018-05-24 (×4): via INTRAVENOUS
  Filled 2018-05-23 (×5): qty 850

## 2018-05-23 MED ORDER — MAGNESIUM SULFATE 2 GM/50ML IV SOLN
2.0000 g | Freq: Once | INTRAVENOUS | Status: AC
  Administered 2018-05-24: 2 g via INTRAVENOUS
  Filled 2018-05-23: qty 50

## 2018-05-23 MED ORDER — SODIUM CHLORIDE 0.9 % IV BOLUS
2000.0000 mL | Freq: Once | INTRAVENOUS | Status: AC
  Administered 2018-05-23: 2000 mL via INTRAVENOUS

## 2018-05-23 NOTE — Progress Notes (Signed)
Repeat ABG with mildly improved pH (7.24 >>7.26) and Bicarb still low now 17.3. Patient received the 1 amp bicarb at 22:26. Will give another 2 amp bicarb now. ABG with much improved oxygenation (PaO2 95 to 199), so will wean PEEP from 14 to 12 as tolerated. Repeat ABG in the AM.

## 2018-05-23 NOTE — Progress Notes (Signed)
[facility] Progress Note Patient Name: Howard Thompson DOB: 1966/01/30 MRN: [id number]   Date of Service  05/23/2018  HPI/Events of Note  ABG on 50%/PRVC 26/TV 580/P 12 = 7.17/49.2/39/18.3  eICU Interventions  Will order: 1. NaHCO3 100 meq IV now.  2. NaHCO3 IV infusion to run IV at 75 mL/hour.  3. Decrease LR IV infusion to 75 mL/hour. 4. Repeat ABG at 9:45 PM.     Intervention Category Major Interventions: Acid-Base disturbance - evaluation and management;Respiratory failure - evaluation and management  Dov Dill Eugene 05/23/2018, 8:40 PM

## 2018-05-23 NOTE — Progress Notes (Addendum)
CRITICAL VALUE ALERT  Critical Value:  Lactic Acid 2.8  Date & Time Notied: 05/22/18   2200  Provider Notified:MD Hammonds  See new orders

## 2018-05-23 NOTE — Progress Notes (Signed)
Consulted with Debbora Dus MD ref: low urine output and abnormal labs. Received verbal Rx for Alb and increase LR.

## 2018-05-23 NOTE — Progress Notes (Signed)
Received verbal order to stop IV cagrelor 2 hrs post PO Brilinta

## 2018-05-23 NOTE — Progress Notes (Signed)
[facility] Progress Note Patient Name: Howard Thompson DOB: Apr 05, 1966 MRN: [id number]   Date of Service  05/23/2018  HPI/Events of Note  ABG on 100%/PRVC 26/TV 580/P 14 = 7.269/39.0/199.  eICU Interventions  Will order: 1. NaHCO3 50 meq IV now. 2. NaHCO3 IV infusion to run at 75 meq/hour.      Intervention Category Major Interventions: Acid-Base disturbance - evaluation and management;Respiratory failure - evaluation and management  Sommer,Steven Cornelia Copa 05/23/2018, 3:56 AM

## 2018-05-23 NOTE — Progress Notes (Addendum)
PCCM Interval Note  Patient with progressive hypotension now on 36mcg Levophed, Vasopressin 0.3.   Systolic Currently 50, CVP 11-12 Cooling stopped, Nimbex, Fentanyl, Versed, Amiodarone D/C   Hypoxia > Required bagging and increased PEEP  Received 3 Pushes of EPI Family at bedside > Agree to no CPR, however would like to continue aggressive medical management.  Cardiology Called for STAT bedside ECHO to evaluate for Pericardial Effusion  EPI gtt started.   Hayden Pedro, AGACNP-BC [facility]  Pgr: [phone]  PCCM Pgr: [phone]

## 2018-05-23 NOTE — Plan of Care (Signed)
  Problem: Clinical Measurements: Goal: Ability to maintain clinical measurements within normal limits will improve Outcome: Progressing Goal: Will remain free from infection Outcome: Progressing Goal: Diagnostic test results will improve Outcome: Progressing Goal: Respiratory complications will improve Outcome: Progressing Goal: Cardiovascular complication will be avoided Outcome: Progressing   Problem: Coping: Goal: Level of anxiety will decrease Outcome: Progressing   Problem: Elimination: Goal: Will not experience complications related to urinary retention Outcome: Progressing   Problem: Pain Managment: Goal: General experience of comfort will improve Outcome: Progressing   Problem: Safety: Goal: Ability to remain free from injury will improve Outcome: Progressing   Problem: Skin Integrity: Goal: Risk for impaired skin integrity will decrease Outcome: Progressing   Problem: Cardiac: Goal: Ability to achieve and maintain adequate cardiopulmonary perfusion will improve Outcome: Progressing   Problem: Neurologic: Goal: Promote progressive neurologic recovery Outcome: Progressing   Problem: Skin Integrity: Goal: Risk for impaired skin integrity will be minimized. Outcome: Progressing   Problem: Cardiac: Goal: Ability to achieve and maintain adequate cardiopulmonary perfusion will improve Outcome: Progressing   Problem: Nutrition: Goal: Adequate nutrition will be maintained Outcome: Not Progressing   Problem: Cardiac: Goal: Vascular access site(s) Level 0-1 will be maintained Outcome: Completed/Met   Problem: Education: Goal: Knowledge of General Education information will improve Description Including pain rating scale, medication(s)/side effects and non-pharmacologic comfort measures Outcome: Not Applicable   Problem: Health Behavior/Discharge Planning: Goal: Ability to manage health-related needs will improve Outcome: Not Applicable   Problem:  Activity: Goal: Risk for activity intolerance will decrease Outcome: Not Applicable   Problem: Elimination: Goal: Will not experience complications related to bowel motility Outcome: Not Applicable   Problem: Education: Goal: Ability to manage disease process will improve Outcome: Not Applicable   Problem: Education: Goal: Understanding of cardiac disease, CV risk reduction, and recovery process will improve Outcome: Not Applicable Goal: Understanding of medication regimen will improve Outcome: Not Applicable Goal: Individualized Educational Video(s) Outcome: Not Applicable   Problem: Activity: Goal: Ability to tolerate increased activity will improve Outcome: Not Applicable   Problem: Health Behavior/Discharge Planning: Goal: Ability to safely manage health-related needs after discharge will improve Outcome: Not Applicable

## 2018-05-23 NOTE — Progress Notes (Signed)
Right femoral sheathe removed... Applied pressure x20 min... Small amount of oozing noted... Held pressure for additional x10 min... Level Zero... No complications... Verified w/pt's RN, Clerance Lav

## 2018-05-23 NOTE — Progress Notes (Signed)
  Echocardiogram 2D Echocardiogram has been performed.  Sharrieff Spratlin G Jered Heiny 05/23/2018, 2:06 PM

## 2018-05-23 NOTE — Progress Notes (Signed)
PCCM Interval Note  Family at bedside (Wife and Family Friend). Discussed results of EEG > with marked suppression of background cortical activity, associated with poor neurological recovery. Wife is going to relay this information to remaining family members. Are awaiting additional 24-48 hours to make further decisions regarding goals of care and code status.   SVO2 45.6. With progressive hypotension. Will Start Dobutamine gtt. Currently on 60 mcg/Levophed and Vasopressin.   Hayden Pedro, AGACNP-BC [facility]  Pgr: [phone]  PCCM Pgr: [phone] e

## 2018-05-23 NOTE — Progress Notes (Signed)
MD Debbora Dus rounding at bedside, RN and MD discussed patient status, vital signs, medication drips, and urine output.  MD ordered lactated ringers drip and a follow up ABG to assess need for sodium bicarbonate drip.  ABG results called to MD Debbora Dus and orders to discontinue sodium bicarbonate drip received.

## 2018-05-23 NOTE — Progress Notes (Signed)
Evaluated patient at bedside, reportedly had hypoxia and hypotension earlier this evening. At time of my exam, BP 148/70 (MAP 96), HR 76, Pox 100% on Levophed @ 80mcg, Epi 20, Vaso 0.06, Bicarb gtt @ 200cc/hr.  Vent: PRVC 100% FIO2, 12 PEEP, R 30, Vt 530.  Lungs CTA b/l Abd soft NTND Extremities cold, trace edema Therapeutic hypothermia has been stopped due to hemodynamic instability.  Bedside TTE by Cards fellow shows no pericardial effusion.  CVP 12 (but is on PEEP 12)  A/P: 1. Shock: - unclear if septic vs cardiogenic vs hypovolemic or mix of all 3 - CVP 12 but on PEEP 12 so may actually be intravascularly dry; give 1L LR bolus and re-eval response - patient is aneuric so unable to eval UOP for assessment of intravascular volume - Hgb 11.6 on ISTAT; will send sample to lab to confirm - continue levophed, epi, vasopressin; obtain new Coox as may benefit from dobutamine - add stress dose steroids - continue vanc and zosyn - sputum gram stain shows abundant GPC in chains and pairs, and few GNR's; blood cultures pending. Urine culture pending - recheck new ABG, BMP, Mg, CBC, Lactate, Trop - EKG earlier this evening showed QTc prolongation; recheck EKG now; if still prolonged will need to give Magnesium  60 minutes nonprocedural critical care time  Vernie Murders, MD Pulmonary & Critical Care Medicine Pager: [phone]

## 2018-05-23 NOTE — Progress Notes (Signed)
MD Debbora Dus at bedside to discuss increasing need for pressors and continued lack of urine production. New lab orders given. Will continue to monitor.

## 2018-05-23 NOTE — Procedures (Signed)
Arterial Catheter Insertion Procedure Note Howard Thompson [id number] 12-12-1965  Procedure: Insertion of Arterial Catheter  Indications: Blood pressure monitoring and Frequent blood sampling  Procedure Details Consent: Unable to obtain consent because of altered level of consciousness. Time Out: Verified patient identification, verified procedure, site/side was marked, verified correct patient position, special equipment/implants available, medications/allergies/relevent history reviewed, required imaging and test results available.  Performed  Maximum sterile technique was used including antiseptics, cap, gloves, gown, hand hygiene, mask and sheet. Skin prep: Chlorhexidine; local anesthetic administered 20 gauge catheter was inserted into left radial artery using the Seldinger technique. ULTRASOUND GUIDANCE USED: NO Evaluation Blood flow good; BP tracing good. Complications: No apparent complications.   Myrtis Ser 05/23/2018

## 2018-05-23 NOTE — Progress Notes (Signed)
71ml propofol/ 44ml fentanyl/ 37ml versed wasted with RN Aquilla Solian in the sink.

## 2018-05-23 NOTE — Progress Notes (Addendum)
Reviewed labs: K 3.1, Ca 6.5, Mg 1.6 Will replete with 40MEQ KCL IV, 1g Calcium gluconate IV, and 2g Magnesium sulfate IV.   Trop 1.99 up from 1.74 previously. EKG nonischemic though; QTc much improved.   Lactate 10.2 up from 2.3; giving IVF's. Continue to trend.

## 2018-05-23 NOTE — Progress Notes (Signed)
[facility] Progress Note Patient Name: Howard Thompson DOB: 1966-02-06 MRN: [id number]   Date of Service  05/23/2018  HPI/Events of Note  Hypotension - BP = 100/66 on Norepinephrine IV infusion at 46 mcg/min.   eICU Interventions  Will order: 1. Vasopressin IV infusion at shock dose.  2. Increase ceiling on Norepinephrine IV infusion to 70 mcg/min.      Intervention Category Major Interventions: Hypotension - evaluation and management  Kaysea Raya Eugene 05/23/2018, 3:45 AM

## 2018-05-23 NOTE — Procedures (Signed)
ELECTROENCEPHALOGRAM REPORT  Date of Study: 05/23/18  Patient's Name: Howard Thompson MRN: [id number] Date of Birth: 1965-12-08  Referring Provider: Hayden Pedro, NP  Clinical History: Howard Thompson is a 52 y.o.  with h/o myasthenia, MI, HTN admitted 05/22/18 with cardiac arrest into hypothermia protocol.  He was found down (? Down time 10 minutes) with initial rhythm of VFib.  Noted use of cocaine.  Found to have a STEMI and 100% LAD lesion.  Noted to have ? Posturing (? Decorticate) on exam.  Head CT without acute changes.  Currently comatose, intubated in hypothermia protocol (33C) on Nimbex, Versed, and Fentanyl.   Medications: Scheduled Meds: . artificial tears  1 application Both Eyes E8B  . aspirin EC  81 mg Oral Daily  . atorvastatin  80 mg Oral q1800  . chlorhexidine gluconate (MEDLINE KIT)  15 mL Mouth Rinse BID  . insulin aspart  0-9 Units Subcutaneous Q4H  . mouth rinse  15 mL Mouth Rinse 10 times per day  . pantoprazole sodium  40 mg Per Tube Daily  . sodium chloride flush  3 mL Intravenous Q12H  . ticagrelor  90 mg Per Tube BID   Continuous Infusions: . sodium chloride 10 mL/hr at 05/23/18 0750  . sodium chloride    . sodium chloride Stopped (05/23/18 0502)  . sodium chloride    . amiodarone 30 mg/hr (05/23/18 0700)  . cisatracurium (NIMBEX) infusion 1 mcg/kg/min (05/23/18 0700)  . fentaNYL infusion INTRAVENOUS 300 mcg/hr (05/23/18 0700)  . midazolam (VERSED) infusion 3 mg/hr (05/23/18 0924)  . norepinephrine (LEVOPHED) Adult infusion 34 mcg/min (05/23/18 0700)  . piperacillin-tazobactam (ZOSYN)  IV 3.375 g (05/23/18 0502)  . potassium chloride 10 mEq (05/23/18 1025)  . propofol (DIPRIVAN) infusion Stopped (05/22/18 2230)  .  sodium bicarbonate (isotonic) infusion in sterile water 75 mL/hr at 05/23/18 0700  . vancomycin 750 mg (05/23/18 1038)  . vasopressin (PITRESSIN) infusion - *FOR SHOCK* 0.03 Units/min (05/23/18 0700)   PRN Meds:.sodium chloride, sodium  chloride, Place/Maintain arterial line **AND** sodium chloride, acetaminophen (TYLENOL) oral liquid 160 mg/5 mL **OR** acetaminophen, [COMPLETED] cisatracurium **AND** cisatracurium (NIMBEX) infusion **AND** cisatracurium, docusate, fentaNYL, midazolam, nitroGLYCERIN, ondansetron (ZOFRAN) IV, sodium chloride flush            Technical Summary: This is a standard 16 channel EEG recording performed according to the international 10-20 electrode system.  AP bipolar, transverse bipolar, and referential montages were obtained, and digitally reformatted as necessary.  Duration of tracing:  22:40  Description: Pt is currently comatose, intubated in hypothermia protocol (33C) on Nimbex, Versed, and Fentanyl.  Background is diffusely suppressed at 3uV sensitivity with a 3-4 Hz theta rhythm intermittently noted over the posterior head regions.  There is no well defined wake, drowsiness, or sleep.  No HV or photic stimulation performed.  No stimulation maneuvers noted.  EKG was SR at 90 bpm.  No epileptiform changes noted.  Impression: This is a very abnormal EEG with marked suppression of background cortical activity, however no epileptiform changes were noted.  In the setting of cardiac arrest, a low-voltage or isoelectric EEG can be associated with a poor neurological outcome, however hypothermia, use of sedative meds, and lack of any assessment of cortical reactivity limit the ability to prognosticate.  A repeat EEG study once the patient is normothermic and off sedative meds, along with stimulation to assess cortical reactivity, would be helpful for prognostication.  A continuous EEG might also be helpful to assess for any non-convulsive seizure activity.  Clinical  correlation advised.   Carvel Getting, M.D. Neurology Cell [phone]

## 2018-05-23 NOTE — Progress Notes (Signed)
CCM paged at 0150 Lactic Acid= 2.3 and CBG=253. See new orders.

## 2018-05-23 NOTE — Progress Notes (Signed)
EEG completed; results pending.    

## 2018-05-23 NOTE — Progress Notes (Signed)
Over the course of the night patient went from HYPERtensive to now HYPOtensive on max dose Levophed. CVP 1 and on recheck is only 3. RN says IVF boluses have been given yet. Will give 2L NS bolus. Recheck CVP q4hrs.   Hypokalemia on AM labs with K 2.8; replete with KCL 60MEQ IV.

## 2018-05-23 NOTE — Progress Notes (Signed)
[facility] Progress Note Patient Name: Uziel Covault DOB: 1966/10/10 MRN: [id number]   Date of Service  05/23/2018  HPI/Events of Note  K+ = 7.7, ionized Ca++ = 0.50 and Creatinine = 0.6. Question K+ result. K+ at 8:30 PM = 3.9 and QRS not widened on bedside monitor.   eICU Interventions  Will order: 1. Replace Ca++.  2. Repeat BMP STAT.      Intervention Category Major Interventions: Electrolyte abnormality - evaluation and management  Maira Christon Eugene 05/23/2018, 1:14 AM

## 2018-05-23 NOTE — Progress Notes (Signed)
Progress Note  Patient Name: Howard Thompson Date of Encounter: 05/23/2018  Primary Cardiologist: No primary care provider on file.   Subjective   Intubated, sedated, paralyzed, and cooled currently.  Inpatient Medications    Scheduled Meds: . artificial tears  1 application Both Eyes X7L  . aspirin EC  81 mg Oral Daily  . atorvastatin  80 mg Oral q1800  . chlorhexidine gluconate (MEDLINE KIT)  15 mL Mouth Rinse BID  . insulin aspart  0-9 Units Subcutaneous Q4H  . mouth rinse  15 mL Mouth Rinse 10 times per day  . pantoprazole sodium  40 mg Per Tube Daily  . sodium chloride flush  3 mL Intravenous Q12H  . ticagrelor  90 mg Per Tube BID   Continuous Infusions: . sodium chloride 10 mL/hr at 05/23/18 0900  . sodium chloride    . sodium chloride Stopped (05/23/18 0502)  . sodium chloride    . amiodarone 30 mg/hr (05/23/18 0900)  . cisatracurium (NIMBEX) infusion 1 mcg/kg/min (05/23/18 0900)  . fentaNYL infusion INTRAVENOUS 300 mcg/hr (05/23/18 0900)  . midazolam (VERSED) infusion 3 mg/hr (05/23/18 0924)  . norepinephrine (LEVOPHED) Adult infusion 40 mcg/min (05/23/18 1132)  . piperacillin-tazobactam (ZOSYN)  IV 3.375 g (05/23/18 0502)  . potassium chloride 10 mEq (05/23/18 1025)  . propofol (DIPRIVAN) infusion Stopped (05/22/18 2230)  .  sodium bicarbonate (isotonic) infusion in sterile water 75 mL/hr at 05/23/18 0900  . vancomycin 750 mg (05/23/18 1038)  . vasopressin (PITRESSIN) infusion - *FOR SHOCK* 0.03 Units/min (05/23/18 0900)   PRN Meds: sodium chloride, sodium chloride, Place/Maintain arterial line **AND** sodium chloride, acetaminophen (TYLENOL) oral liquid 160 mg/5 mL **OR** acetaminophen, [COMPLETED] cisatracurium **AND** cisatracurium (NIMBEX) infusion **AND** cisatracurium, docusate, fentaNYL, midazolam, nitroGLYCERIN, ondansetron (ZOFRAN) IV, sodium chloride flush   Vital Signs    Vitals:   05/23/18 0836 05/23/18 0900 05/23/18 1000 05/23/18 1100  BP: 121/75  105/85 100/83 (!) 115/96  Pulse: 87 86    Resp: (!) 26 (!) 25 13 (!) 26  Temp:  (!) 91.6 F (33.1 C) (!) 91.8 F (33.2 C) (!) 91.9 F (33.3 C)  TempSrc:  Bladder Bladder Bladder  SpO2: 100% 100% 100% 100%  Weight:      Height:        Intake/Output Summary (Last 24 hours) at 05/23/2018 1134 Last data filed at 05/23/2018 1100 Gross per 24 hour  Intake 3509.19 ml  Output 1333 ml  Net 2176.19 ml   Filed Weights   05/22/18 1920 05/22/18 2000 05/23/18 0500  Weight: 150 lb 2.1 oz (68.1 kg) 149 lb 14.6 oz (68 kg) 161 lb 9.6 oz (73.3 kg)    Telemetry    Sinus rhythm, occasional PVCs- Personally Reviewed  ECG    Sinus rhythm, PVCs- Personally Reviewed  Physical Exam   GEN: No acute distress.   Neck: No JVD Cardiac: RRR, no murmurs, rubs, or gallops.  Respiratory: Clear to auscultation bilaterally. GI: Soft, nontender, non-distended  MS: No edema; No deformity. Neuro:  Nonfocal  Psych: Normal affect   Labs    Chemistry Recent Labs  Lab 05/22/18 2027  05/23/18 0153 05/23/18 0439 05/23/18 0822  NA 138   < > 140 142 142  K 3.9   < > 4.2 2.8* 3.3*  CL 108   < > 109 109 111  CO2 19*  --   --  22 20*  GLUCOSE 188*   < > 264* 176* 141*  BUN 20   < > 26* 24*  22*  CREATININE 0.99   < > 1.00 1.14 1.10  CALCIUM 8.3*  --   --  8.0* 7.3*  GFRNONAA 33*  --   --  >60 >60  GFRAA 38*  --   --  >60 >60  ANIONGAP 11  --   --  11 11   < > = values in this interval not displayed.     Hematology Recent Labs  Lab 05/22/18 1744  05/23/18 0041 05/23/18 0153 05/23/18 0439  WBC 14.7*  --   --   --  5.7  RBC 4.81  --   --   --  5.01  HGB 13.0   < > 12.6* 14.6 13.4  HCT 42.1   < > 37.0* 43.0 42.1  MCV 87.5  --   --   --  84.0  MCH 27.0  --   --   --  26.7  MCHC 30.9  --   --   --  31.8  RDW 18.8*  --   --   --  18.6*  PLT 341  --   --   --  272   < > = values in this interval not displayed.    Cardiac Enzymes Recent Labs  Lab 05/22/18 1744 05/22/18 2027 05/23/18 0439  05/23/18 0822  TROPONINI 0.11* 1.19* 2.05* 1.69*    Recent Labs  Lab 05/22/18 1736  TROPIPOC 0.11*     BNP Recent Labs  Lab 05/22/18 2027  BNP 276.5*     DDimer No results for input(s): DDIMER in the last 168 hours.   Radiology    Ct Head Wo Contrast  Result Date: 05/22/2018 CLINICAL DATA:  Status post CPR EXAM: CT HEAD WITHOUT CONTRAST CT CERVICAL SPINE WITHOUT CONTRAST TECHNIQUE: Multidetector CT imaging of the head and cervical spine was performed following the standard protocol without intravenous contrast. Multiplanar CT image reconstructions of the cervical spine were also generated. COMPARISON:  None. FINDINGS: CT HEAD FINDINGS Brain: No acute intracranial abnormality. Specifically, no hemorrhage, hydrocephalus, mass lesion, acute infarction, or significant intracranial injury. Vascular: No hyperdense vessel or unexpected calcification. Skull: No acute calvarial abnormality. Sinuses/Orbits: Mucosal thickening throughout the paranasal sinuses. Opacified scattered ethmoid air cells. Other: None CT CERVICAL SPINE FINDINGS Alignment: Normal Skull base and vertebrae: No fracture Soft tissues and spinal canal: Prevertebral soft tissues are normal. No epidural or paraspinal hematoma. Disc levels: Slight disc space narrowing at C5-6 and C6-7. Mild degenerative facet disease bilaterally. Upper chest: Mild emphysema in the apices. Other: No acute findings IMPRESSION: No acute intracranial abnormality. No acute bony abnormality in the cervical spine. Electronically Signed   By: Rolm Baptise M.D.   On: 05/22/2018 18:22   Ct Cervical Spine Wo Contrast  Result Date: 05/22/2018 CLINICAL DATA:  Status post CPR EXAM: CT HEAD WITHOUT CONTRAST CT CERVICAL SPINE WITHOUT CONTRAST TECHNIQUE: Multidetector CT imaging of the head and cervical spine was performed following the standard protocol without intravenous contrast. Multiplanar CT image reconstructions of the cervical spine were also generated.  COMPARISON:  None. FINDINGS: CT HEAD FINDINGS Brain: No acute intracranial abnormality. Specifically, no hemorrhage, hydrocephalus, mass lesion, acute infarction, or significant intracranial injury. Vascular: No hyperdense vessel or unexpected calcification. Skull: No acute calvarial abnormality. Sinuses/Orbits: Mucosal thickening throughout the paranasal sinuses. Opacified scattered ethmoid air cells. Other: None CT CERVICAL SPINE FINDINGS Alignment: Normal Skull base and vertebrae: No fracture Soft tissues and spinal canal: Prevertebral soft tissues are normal. No epidural or paraspinal hematoma. Disc levels: Slight disc  space narrowing at C5-6 and C6-7. Mild degenerative facet disease bilaterally. Upper chest: Mild emphysema in the apices. Other: No acute findings IMPRESSION: No acute intracranial abnormality. No acute bony abnormality in the cervical spine. Electronically Signed   By: Rolm Baptise M.D.   On: 05/22/2018 18:22   Dg Chest Port 1 View  Result Date: 05/23/2018 CLINICAL DATA:  Respiratory failure EXAM: PORTABLE CHEST 1 VIEW COMPARISON:  Chest radiograph from one day prior. FINDINGS: Endotracheal tube tip is 5.1 cm above the carina. Left internal jugular central venous catheter loops in the upper mediastinum with the tip oriented superiorly in the high right mediastinum, unchanged. Intact sternotomy wires. Enteric tube enters lower thoracic esophagus with the tip not definitely seen on this image. Stable cardiomediastinal silhouette with normal heart size. No pneumothorax. No pleural effusion. Stable patchy right lung base and left lower parahilar consolidation. IMPRESSION: 1. Stable malpositioned left internal jugular central venous catheter with the tip oriented superiorly in the high right mediastinum. Otherwise well-positioned support structures. 2. Stable patchy right lung base and left lower parahilar consolidation. Electronically Signed   By: Ilona Sorrel M.D.   On: 05/23/2018 07:39   Dg  Chest Port 1 View  Result Date: 05/22/2018 CLINICAL DATA:  Central line placement. EXAM: PORTABLE CHEST 1 VIEW COMPARISON:  May 22, 2018 FINDINGS: An ETT terminates in good position in the mid trachea. A new left central line courses to the right of midline with the tip directed superiorly. Recommend repositioning. The NG tube terminates below today's film. No pneumothorax. Mild left perihilar opacity. More focal opacity in the right medial lung base most suggestive of infiltrate. The heart, hila, and mediastinum are unchanged. IMPRESSION: 1. The new left central line terminates to the right of midline with the tip directed superiorly, possibly in the right brachiocephalic vein. Recommend repositioning. 2. Other support apparatus as above. 3. Focal infiltrate in the medial right lung base favored to represent pneumonia or aspiration. Recommend follow-up to resolution. 4. Left perihilar opacity is nonspecific but may represent infiltrate as well. These results will be called to the ordering clinician or representative by the Radiologist Assistant, and communication documented in the PACS or zVision Dashboard. Electronically Signed   By: Dorise Bullion III M.D   On: 05/22/2018 21:55   Dg Chest Port 1 View  Result Date: 05/22/2018 CLINICAL DATA:  Endotracheal tube placement EXAM: PORTABLE CHEST 1 VIEW COMPARISON:  None FINDINGS: Endotracheal tube tip is at the level of the clavicular heads. The orogastric tube tip and side port project over the stomach. There are moderate bilateral parahilar opacities. Lungs are otherwise clear. IMPRESSION: 1. Radiographically appropriate positioning of endotracheal and orogastric tubes. 2. Bilateral parahilar opacities may indicate pulmonary edema. Electronically Signed   By: Ulyses Jarred M.D.   On: 05/22/2018 20:35    Cardiac Studies   [facility] 11/22/17  Ost LAD to Prox LAD lesion is 100% stenosed.  A drug-eluting stent was successfully placed using a STENT SYNERGY DES  3X20.  Post intervention, there is a 0% residual stenosis.  There is severe left ventricular systolic dysfunction.  LV end diastolic pressure is mildly elevated.  The left ventricular ejection fraction is less than 25% by visual estimate.  Patient Profile     52 y.o. male history of myasthenia gravis post thymus resection, MI several years ago, hypertension who presented to the hospital with cardiac arrest, found to have anterior ST elevation and is now status post LAD stent.  Assessment & Plan  1.  Anterior STEMI: Status post drug-eluting stent to the LAD.  Currently on aspirin, Brilinta.  Is also put on high-dose atorvastatin.  2.  Cardiac arrest: Likely caused by his acute anterior STEMI.  Continue aspirin and Brilinta.  Also on amiodarone IV.  Continue this medication as well.  Plan for cooling for 24 hours.  Will likely need neurologic evaluation in the future.  3.  Possible aspiration pneumonia: Currently on the ventilator.  Plan per primary critical care.  For questions or updates, please contact [place] Please consult [email or web address] for contact info under Cardiology/STEMI.      Signed, Will Meredith Leeds, MD  05/23/2018, 11:34 AM

## 2018-05-23 NOTE — Progress Notes (Signed)
CRITICAL VALUE ALERT  Critical Value:  Troponin=1.19    INR5.53  Date & Time Notied:  05/22/18  Provider Notified: MD Fudim  No new orders

## 2018-05-23 NOTE — Progress Notes (Signed)
   05/23/18 2133  Clinical Encounter Type  Visited With Health care provider  Visit Type Patient actively dying  Referral From Nurse  Consult/Referral To [place]  Idelle Crouch)   Responded to a call to support the family.  They had reached out to [place] and I met and escorted Father Marcachio to meet the family.  Family was very appreciative the Idelle Crouch was able to come.  Will follow and support as needed. Chaplain Katherene Ponto

## 2018-05-23 NOTE — Progress Notes (Signed)
[facility] Progress Note Patient Name: Howard Thompson DOB: May 17, 1966 MRN: [id number]   Date of Service  05/23/2018  HPI/Events of Note  Hypotension - Patient did not tolerated Dobutamine IV infusion at 2.5 mctg/kg/min started for COOX = 45.6% (Hgb = 13.4 and CVP = 8.   eICU Interventions  Will order: 1. CVP now.  2. ABG STAT. 3. Increase ceiling on Norepinephrine IV infusion to 80 mcg/min.     Intervention Category Major Interventions: Hypotension - evaluation and management  Sommer,Steven Cornelia Copa 05/23/2018, 8:31 PM

## 2018-05-24 ENCOUNTER — Inpatient Hospital Stay (HOSPITAL_COMMUNITY): Payer: No Typology Code available for payment source

## 2018-05-24 ENCOUNTER — Other Ambulatory Visit: Payer: Self-pay

## 2018-05-24 ENCOUNTER — Encounter (HOSPITAL_COMMUNITY): Payer: Self-pay

## 2018-05-24 DIAGNOSIS — I4901 Ventricular fibrillation: Secondary | ICD-10-CM

## 2018-05-24 DIAGNOSIS — I469 Cardiac arrest, cause unspecified: Secondary | ICD-10-CM

## 2018-05-24 LAB — BLOOD GAS, ARTERIAL
ACID-BASE EXCESS: 2.9 mmol/L — AB (ref 0.0–2.0)
ACID-BASE EXCESS: 7.5 mmol/L — AB (ref 0.0–2.0)
Acid-base deficit: 7.6 mmol/L — ABNORMAL HIGH (ref 0.0–2.0)
BICARBONATE: 18.4 mmol/L — AB (ref 20.0–28.0)
BICARBONATE: 27.7 mmol/L (ref 20.0–28.0)
BICARBONATE: 31.2 mmol/L — AB (ref 20.0–28.0)
Drawn by: 414221
FIO2: 100
FIO2: 100
FIO2: 70
Hi Frequency JET Vent Rate: 18
LHR: 20 {breaths}/min
LHR: 30 {breaths}/min
LHR: 30 {breaths}/min
O2 SAT: 99.5 %
O2 Saturation: 95 %
O2 Saturation: 99.4 %
PATIENT TEMPERATURE: 98.6
PCO2 ART: 49.2 mmHg — AB (ref 32.0–48.0)
PEEP/CPAP: 12 cmH2O
PEEP: 5 cmH2O
PEEP: 12 cmH2O
PH ART: 7.369 (ref 7.350–7.450)
PH ART: 7.482 — AB (ref 7.350–7.450)
Patient temperature: 98.6
Patient temperature: 98.6
VT: 580 mL
VT: 580 mL
pCO2 arterial: 44 mmHg (ref 32.0–48.0)
pCO2 arterial: 42.2 mmHg (ref 32.0–48.0)
pH, Arterial: 7.246 — ABNORMAL LOW (ref 7.350–7.450)
pO2, Arterial: 95.1 mmHg (ref 83.0–108.0)
pO2, Arterial: 257 mmHg — ABNORMAL HIGH (ref 83.0–108.0)
pO2, Arterial: 199 mmHg — ABNORMAL HIGH (ref 83.0–108.0)

## 2018-05-24 LAB — BASIC METABOLIC PANEL
ANION GAP: 17 — AB (ref 5–15)
ANION GAP: 16 — AB (ref 5–15)
ANION GAP: 17 — AB (ref 5–15)
Anion gap: 20 — ABNORMAL HIGH (ref 5–15)
Anion gap: 22 — ABNORMAL HIGH (ref 5–15)
BUN: 28 mg/dL — AB (ref 6–20)
BUN: 28 mg/dL — ABNORMAL HIGH (ref 6–20)
BUN: 29 mg/dL — ABNORMAL HIGH (ref 6–20)
BUN: 31 mg/dL — ABNORMAL HIGH (ref 6–20)
BUN: 34 mg/dL — ABNORMAL HIGH (ref 6–20)
CALCIUM: 6.7 mg/dL — AB (ref 8.9–10.3)
CALCIUM: 6.7 mg/dL — AB (ref 8.9–10.3)
CALCIUM: 6.4 mg/dL — AB (ref 8.9–10.3)
CALCIUM: 6.1 mg/dL — AB (ref 8.9–10.3)
CALCIUM: 6.1 mg/dL — AB (ref 8.9–10.3)
CO2: 20 mmol/L — ABNORMAL LOW (ref 22–32)
CO2: 20 mmol/L — AB (ref 22–32)
CO2: 28 mmol/L (ref 22–32)
CO2: 28 mmol/L (ref 22–32)
CO2: 29 mmol/L (ref 22–32)
CREATININE: 2.01 mg/dL — AB (ref 0.61–1.24)
CREATININE: 2.43 mg/dL — AB (ref 0.61–1.24)
CREATININE: 2.56 mg/dL — AB (ref 0.61–1.24)
CREATININE: 3.02 mg/dL — AB (ref 0.61–1.24)
Chloride: 103 mmol/L (ref 98–111)
Chloride: 102 mmol/L (ref 98–111)
Chloride: 97 mmol/L — ABNORMAL LOW (ref 98–111)
Chloride: 96 mmol/L — ABNORMAL LOW (ref 98–111)
Chloride: 93 mmol/L — ABNORMAL LOW (ref 98–111)
Creatinine, Ser: 2.16 mg/dL — ABNORMAL HIGH (ref 0.61–1.24)
GFR calc Af Amer: 32 mL/min — ABNORMAL LOW (ref >60)
GFR calc Af Amer: 26 mL/min — ABNORMAL LOW (ref >60)
GFR calc non Af Amer: 37 mL/min — ABNORMAL LOW (ref >60)
GFR, EST AFRICAN AMERICAN: 43 mL/min — AB (ref >60)
GFR, EST AFRICAN AMERICAN: 39 mL/min — AB (ref >60)
GFR, EST AFRICAN AMERICAN: 34 mL/min — AB (ref >60)
GFR, EST NON AFRICAN AMERICAN: 34 mL/min — AB (ref >60)
GFR, EST NON AFRICAN AMERICAN: 29 mL/min — AB (ref >60)
GFR, EST NON AFRICAN AMERICAN: 27 mL/min — AB (ref >60)
GFR, EST NON AFRICAN AMERICAN: 22 mL/min — AB (ref >60)
GLUCOSE: 95 mg/dL (ref 70–99)
Glucose, Bld: 182 mg/dL — ABNORMAL HIGH (ref 70–99)
Glucose, Bld: 152 mg/dL — ABNORMAL HIGH (ref 70–99)
Glucose, Bld: 120 mg/dL — ABNORMAL HIGH (ref 70–99)
Glucose, Bld: 112 mg/dL — ABNORMAL HIGH (ref 70–99)
Potassium: 4.5 mmol/L (ref 3.5–5.1)
Potassium: 4.1 mmol/L (ref 3.5–5.1)
Potassium: 4.1 mmol/L (ref 3.5–5.1)
Potassium: 3.6 mmol/L (ref 3.5–5.1)
Potassium: 3.7 mmol/L (ref 3.5–5.1)
SODIUM: 143 mmol/L (ref 135–145)
SODIUM: 144 mmol/L (ref 135–145)
SODIUM: 142 mmol/L (ref 135–145)
Sodium: 140 mmol/L (ref 135–145)
Sodium: 139 mmol/L (ref 135–145)

## 2018-05-24 LAB — POCT I-STAT 3, ART BLOOD GAS (G3+)
Acid-base deficit: 7 mmol/L — ABNORMAL HIGH (ref 0.0–2.0)
Acid-base deficit: 8 mmol/L — ABNORMAL HIGH (ref 0.0–2.0)
BICARBONATE: 21.4 mmol/L (ref 20.0–28.0)
Bicarbonate: 19.8 mmol/L — ABNORMAL LOW (ref 20.0–28.0)
O2 Saturation: 100 %
O2 Saturation: 100 %
PCO2 ART: 52.1 mmHg — AB (ref 32.0–48.0)
PO2 ART: 212 mmHg — AB (ref 83.0–108.0)
Patient temperature: 34
TCO2: 21 mmol/L — ABNORMAL LOW (ref 22–32)
TCO2: 23 mmol/L (ref 22–32)
pCO2 arterial: 45.9 mmHg (ref 32.0–48.0)
pH, Arterial: 7.244 — ABNORMAL LOW (ref 7.350–7.450)
pH, Arterial: 7.204 — ABNORMAL LOW (ref 7.350–7.450)
pO2, Arterial: 201 mmHg — ABNORMAL HIGH (ref 83.0–108.0)

## 2018-05-24 LAB — POCT ACTIVATED CLOTTING TIME
ACTIVATED CLOTTING TIME: 235 s
ACTIVATED CLOTTING TIME: 241 s
ACTIVATED CLOTTING TIME: 0 s
Activated Clotting Time: 356 seconds
Activated Clotting Time: >1000 seconds

## 2018-05-24 LAB — PHOSPHORUS: Phosphorus: 9.6 mg/dL — ABNORMAL HIGH (ref 2.5–4.6)

## 2018-05-24 LAB — CBC WITH DIFFERENTIAL/PLATELET
BASOS ABS: 0 10*3/uL (ref 0.0–0.1)
BASOS ABS: 0 10*3/uL (ref 0.0–0.1)
BASOS PCT: 0 %
BASOS PCT: 0 %
EOS ABS: 0 10*3/uL (ref 0.0–0.7)
EOS PCT: 4 %
Eosinophils Absolute: 0.3 10*3/uL (ref 0.0–0.7)
Eosinophils Relative: 0 %
HCT: 36 % — ABNORMAL LOW (ref 39.0–52.0)
HCT: 37.8 % — ABNORMAL LOW (ref 39.0–52.0)
HEMOGLOBIN: 11.2 g/dL — AB (ref 13.0–17.0)
Hemoglobin: 11.6 g/dL — ABNORMAL LOW (ref 13.0–17.0)
LYMPHS PCT: 4 %
LYMPHS PCT: 2 %
Lymphs Abs: 0.3 10*3/uL — ABNORMAL LOW (ref 0.7–4.0)
Lymphs Abs: 0.2 10*3/uL — ABNORMAL LOW (ref 0.7–4.0)
MCH: 27.3 pg (ref 26.0–34.0)
MCH: 27.2 pg (ref 26.0–34.0)
MCHC: 31.1 g/dL (ref 30.0–36.0)
MCHC: 30.7 g/dL (ref 30.0–36.0)
MCV: 87.8 fL (ref 78.0–100.0)
MCV: 88.5 fL (ref 78.0–100.0)
MONO ABS: 0.2 10*3/uL (ref 0.1–1.0)
MONOS PCT: 2 %
Monocytes Absolute: 0.1 10*3/uL (ref 0.1–1.0)
Monocytes Relative: 2 %
NEUTROS ABS: 5.7 10*3/uL (ref 1.7–7.7)
NEUTROS ABS: 8.9 10*3/uL — AB (ref 1.7–7.7)
Neutrophils Relative %: 90 %
Neutrophils Relative %: 96 %
PLATELETS: 148 10*3/uL — AB (ref 150–400)
Platelets: 142 10*3/uL — ABNORMAL LOW (ref 150–400)
RBC: 4.1 MIL/uL — ABNORMAL LOW (ref 4.22–5.81)
RBC: 4.27 MIL/uL (ref 4.22–5.81)
RDW: 18.9 % — AB (ref 11.5–15.5)
RDW: 19.1 % — AB (ref 11.5–15.5)
WBC MORPHOLOGY: INCREASED
WBC Morphology: INCREASED
WBC: 6.4 10*3/uL (ref 4.0–10.5)
WBC: 9.3 10*3/uL (ref 4.0–10.5)

## 2018-05-24 LAB — GLUCOSE, CAPILLARY
Glucose-Capillary: 177 mg/dL — ABNORMAL HIGH (ref 70–99)
Glucose-Capillary: 141 mg/dL — ABNORMAL HIGH (ref 70–99)
Glucose-Capillary: 60 mg/dL — ABNORMAL LOW (ref 70–99)
Glucose-Capillary: 130 mg/dL — ABNORMAL HIGH (ref 70–99)
Glucose-Capillary: 125 mg/dL — ABNORMAL HIGH (ref 70–99)
Glucose-Capillary: 95 mg/dL (ref 70–99)
Glucose-Capillary: 94 mg/dL (ref 70–99)
Glucose-Capillary: 74 mg/dL (ref 70–99)

## 2018-05-24 LAB — LACTIC ACID, PLASMA
Lactic Acid, Venous: 11.8 mmol/L (ref 0.5–1.9)
Lactic Acid, Venous: 5.2 mmol/L (ref 0.5–1.9)

## 2018-05-24 LAB — HEPATIC FUNCTION PANEL
ALK PHOS: 107 U/L (ref 38–126)
ALT: 8869 U/L — ABNORMAL HIGH (ref 0–44)
AST: >10000 U/L — ABNORMAL HIGH (ref 15–41)
Albumin: 2.4 g/dL — ABNORMAL LOW (ref 3.5–5.0)
BILIRUBIN TOTAL: 1.4 mg/dL — AB (ref 0.3–1.2)
Bilirubin, Direct: 0.5 mg/dL — ABNORMAL HIGH (ref 0.0–0.2)
Indirect Bilirubin: 0.9 mg/dL (ref 0.3–0.9)
TOTAL PROTEIN: 4.2 g/dL — AB (ref 6.5–8.1)

## 2018-05-24 LAB — POCT I-STAT, CHEM 8
BUN: 21 mg/dL — AB (ref 6–20)
Calcium, Ion: 1.16 mmol/L (ref 1.15–1.40)
Chloride: 106 mmol/L (ref 98–111)
Creatinine, Ser: 0.9 mg/dL (ref 0.61–1.24)
Glucose, Bld: 174 mg/dL — ABNORMAL HIGH (ref 70–99)
HEMATOCRIT: 42 % (ref 39.0–52.0)
HEMOGLOBIN: 14.3 g/dL (ref 13.0–17.0)
Potassium: 4.6 mmol/L (ref 3.5–5.1)
Sodium: 141 mmol/L (ref 135–145)
TCO2: 21 mmol/L — ABNORMAL LOW (ref 22–32)

## 2018-05-24 LAB — URINE CULTURE: Culture: NO GROWTH

## 2018-05-24 LAB — VANCOMYCIN, RANDOM: Vancomycin Rm: 19

## 2018-05-24 LAB — MAGNESIUM: Magnesium: 2.4 mg/dL (ref 1.7–2.4)

## 2018-05-24 LAB — PROTIME-INR
INR: 3.87
Prothrombin Time: 37.7 seconds — ABNORMAL HIGH (ref 11.4–15.2)

## 2018-05-24 LAB — ABO/RH: ABO/RH(D): O POS

## 2018-05-24 LAB — TYPE AND SCREEN
ABO/RH(D): O POS
Antibody Screen: NEGATIVE

## 2018-05-24 MED ORDER — FENTANYL CITRATE (PF) 100 MCG/2ML IJ SOLN
100.0000 ug | INTRAMUSCULAR | Status: DC | PRN
  Filled 2018-05-24: qty 2

## 2018-05-24 MED ORDER — PRISMASOL BGK 4/2.5 32-4-2.5 MEQ/L IV SOLN
INTRAVENOUS | Status: DC
  Administered 2018-05-25 – 2018-05-30 (×13): via INTRAVENOUS_CENTRAL
  Filled 2018-05-24 (×15): qty 5000

## 2018-05-24 MED ORDER — FAMOTIDINE 40 MG/5ML PO SUSR
20.0000 mg | Freq: Every day | ORAL | Status: DC
  Administered 2018-05-24 – 2018-06-02 (×8): 20 mg via ORAL
  Filled 2018-05-24 (×12): qty 2.5

## 2018-05-24 MED ORDER — PRISMASOL BGK 4/2.5 32-4-2.5 MEQ/L IV SOLN
INTRAVENOUS | Status: DC
  Administered 2018-05-25 – 2018-05-30 (×29): via INTRAVENOUS_CENTRAL
  Filled 2018-05-24 (×45): qty 5000

## 2018-05-24 MED ORDER — FENTANYL CITRATE (PF) 100 MCG/2ML IJ SOLN
100.0000 ug | INTRAMUSCULAR | Status: DC | PRN
  Administered 2018-05-24 – 2018-05-28 (×7): 100 ug via INTRAVENOUS
  Filled 2018-05-24 (×7): qty 2

## 2018-05-24 MED ORDER — VANCOMYCIN HCL IN DEXTROSE 750-5 MG/150ML-% IV SOLN
750.0000 mg | INTRAVENOUS | Status: DC
  Administered 2018-05-24: 750 mg via INTRAVENOUS
  Filled 2018-05-24: qty 150

## 2018-05-24 MED ORDER — HEPARIN SODIUM (PORCINE) 1000 UNIT/ML DIALYSIS
1000.0000 [IU] | INTRAMUSCULAR | Status: DC | PRN
  Filled 2018-05-24: qty 6

## 2018-05-24 MED ORDER — PRISMASOL BGK 4/2.5 32-4-2.5 MEQ/L IV SOLN
INTRAVENOUS | Status: DC
  Administered 2018-05-25 – 2018-05-30 (×11): via INTRAVENOUS_CENTRAL
  Filled 2018-05-24 (×15): qty 5000

## 2018-05-24 MED ORDER — SODIUM CHLORIDE 0.9 % IV SOLN
1.0000 g | Freq: Once | INTRAVENOUS | Status: AC
  Administered 2018-05-24: 1 g via INTRAVENOUS
  Filled 2018-05-24: qty 10

## 2018-05-24 MED ORDER — SODIUM CHLORIDE 0.9% IV SOLUTION
Freq: Once | INTRAVENOUS | Status: AC
  Administered 2018-05-24: 23:00:00 via INTRAVENOUS

## 2018-05-24 MED FILL — Nitroglycerin IV Soln 100 MCG/ML in D5W: INTRA_ARTERIAL | Qty: 10 | Status: AC

## 2018-05-24 MED FILL — Medication: Qty: 1 | Status: AC

## 2018-05-24 NOTE — Progress Notes (Signed)
[facility] Progress Note Patient Name: Howard Thompson DOB: 02/06/1966 MRN: [id number]   Date of Service  05/24/2018  HPI/Events of Note  Oliguria - Foley not draining. Residual > 400 mL.   eICU Interventions  Replace Foley catheter.      Intervention Category Intermediate Interventions: Oliguria - evaluation and management  [name] 05/24/2018, 3:30 AM

## 2018-05-24 NOTE — Progress Notes (Addendum)
[facility] Progress Note Patient Name: Hai Grabe DOB: 1966-05-13 MRN: [id number]   Date of Service  05/24/2018  HPI/Events of Note  Multiple issues: 1. Ca++ = 6.1 which corrects to 7.38 given albumin = 2.4 and 2. Patient needs HD access for CRRT. Last INR at 10 AM + 3.87.   eICU Interventions  Will order: 1. Replace Ca++. 2. Transfuse 3 units of FFP now.  3. Repeat PT/INR and PTT at 2 AM 05/25/2018.  After discussion with ground team, plan is to aggressively correct coagulopathy overnight. Day rounding team can then decide if CRRT still indicated after discussion with Nephrology in AM. If dialysis access is desired, hopefully coagulopathy will be corrected by AM.        Lysle Dingwall 05/24/2018, 8:47 PM

## 2018-05-24 NOTE — Progress Notes (Signed)
CRITICAL VALUE ALERT  Critical Value:  Calcium 6.1  Date & Time Notied:  2040  Provider Notified: S. Oletta Darter, MD  Orders Received/Actions taken: 1g calcium gluconate

## 2018-05-24 NOTE — Progress Notes (Signed)
Lab called regarding pending ionized calcium. Said turnover on results usually takes a day, as it is sent out.   Will see what evening BMet results are and discuss with MD if Ca still low.

## 2018-05-24 NOTE — Progress Notes (Signed)
Pharmacy Antibiotic Note  Howard Thompson is a 52 y.o. male admitted on 05/22/2018 with STEMI s/p cardiopulmonary arrest.  Pharmacy has been consulted for Zosyn and vancomycin dosing with concern for aspiration.  Code cool patient. Scr has increased to 2.1 this am. With acute renal failure checked vancomycin level which was 19. This is at goal but patient very high risk for accumulating, there is also plans to start CRRT later once line placed.   Will decrease vancomycin to 750mg  q24 hours for now. This would also be CRRT dose. Will adjust zosyn dose once CRRT plan is put into place.   Plan: Zosyn 3.375g IV q8h (4 hour infusion).  Vancomycin 750mg  IV q24 hours Monitor CrCl, LOT, cultures  Height: 5\' 10"  (177.8 cm) Weight: 177 lb 14.6 oz (80.7 kg) IBW/kg (Calculated) : 73  Temp (24hrs), Avg:92.6 F (33.7 C), Min:90 F (32.2 C), Max:97.2 F (36.2 C)  Recent Labs  Lab 05/22/18 1744  05/22/18 2027  05/23/18 0045  05/23/18 0439  05/23/18 1613 05/23/18 2015 05/23/18 2253 05/24/18 0412 05/24/18 0620  WBC 14.7*  --   --   --   --   --  5.7  --   --   --  6.4  --  9.3  CREATININE 1.23   < > 0.99   < >  --    < > 1.14   < > 1.25* 1.53* 1.81* 2.01* 2.16*  LATICACIDVEN  --   --  2.8*  --  2.3*  --   --   --   --   --  10.2* 11.8*  --    < > = values in this interval not displayed.    Estimated Creatinine Clearance: 41.8 mL/min (A) (by C-G formula based on SCr of 2.16 mg/dL (H)).    No Known Allergies  Antimicrobials this admission: 7/27 Zosyn >> 7/27 Vancomycin >>  Dose adjustments this admission: 7/29 Vancomycin trough 19 - >decrease vancomycin to 750q24h  Microbiology results: 7/27 urine - ng 7/27 bld x2 - ngtd 7/27 TA - reincubate   Thank you for allowing pharmacy to be a part of this patient's care. Erin Hearing PharmD., BCPS Clinical Pharmacist  Please check AMION for all [facility] Pharmacy numbers 05/24/2018

## 2018-05-24 NOTE — Progress Notes (Signed)
Patient has been able to maintain MAPs> 80 after starting Epi gtt and has been oxygenating appropriately for several hours. RN has been able to titrate down levophed to 12mcg.  RN will continue to monitor

## 2018-05-24 NOTE — Progress Notes (Signed)
Patient reported to be increasingly resistant to levophed and required increasing dosage to maintain MAP >80 throughout the day. On assessment pt was on 59mcg of levophed and MAPs we mid-70s. At this time 45.6 co-ox had resulted and NP Hayden Pedro was notified and put in to start patient on dobutamine. Dobutamine started at an pt began to desat and drop pressure around 2020. Dobutamine was stopped RT was called (O2 sats=80s...50s...56s) and MD Oletta Darter was notified of patient status and NP Louretta Parma was called to the bedside. 100% O2 breaths were given and levophed was titrated up. When NP arrived therapeutic hypothermia was stopped due to hemodynamic instability and all active drips were stopped except for levophed and Vasopressin. Levophed was titrated to the max without effect and 1mg  of epi was given. Pt's O2 and BP status improved briefly but pt began to decline again- see vitals for trends- Pt was disconnected from the ventilator and manually bagged by RT with improvement in respiratory status. A total of 3 pushes of epi were given and 150 of Sodium Bicarb. Pt was started on an epi drip and a Sodium Bicard drip at 2100. ABGs and other labs were drawn during this time- refer to lab results-primary ABG at 2035= pH7.23 CO2-41.3 Bicarb 18.3 pO2-29. Family was present at the bedside during this event and educated about patient's status and comforted. Chaplain called to the bedside to conntinue to comfort family and Father Tomma Lightning presented to the bedside to pray over patient with the family.

## 2018-05-24 NOTE — Plan of Care (Signed)
  Problem: Clinical Measurements: Goal: Ability to maintain clinical measurements within normal limits will improve Outcome: Progressing Goal: Will remain free from infection Outcome: Progressing Goal: Diagnostic test results will improve Outcome: Progressing Goal: Respiratory complications will improve Outcome: Progressing Goal: Cardiovascular complication will be avoided Outcome: Progressing   Problem: Coping: Goal: Level of anxiety will decrease Outcome: Progressing   Problem: Elimination: Goal: Will not experience complications related to urinary retention Outcome: Progressing   Problem: Pain Managment: Goal: General experience of comfort will improve Outcome: Progressing   Problem: Safety: Goal: Ability to remain free from injury will improve Outcome: Progressing   Problem: Skin Integrity: Goal: Risk for impaired skin integrity will decrease Outcome: Progressing   Problem: Cardiac: Goal: Ability to achieve and maintain adequate cardiopulmonary perfusion will improve Outcome: Progressing   Problem: Neurologic: Goal: Promote progressive neurologic recovery Outcome: Progressing   Problem: Skin Integrity: Goal: Risk for impaired skin integrity will be minimized. Outcome: Progressing   Problem: Cardiac: Goal: Ability to achieve and maintain adequate cardiopulmonary perfusion will improve Outcome: Progressing   Problem: Nutrition: Goal: Adequate nutrition will be maintained Outcome: Not Progressing

## 2018-05-24 NOTE — Progress Notes (Signed)
[facility] Progress Note Patient Name: Howard Thompson DOB: April 07, 1966 MRN: [id number]   Date of Service  05/24/2018  HPI/Events of Note  Lactic Acid = 11.8 --> 5.2.  eICU Interventions  Will order: 1. Repeat Lactic Acid at 12 midnight.      Intervention Category Major Interventions: Acid-Base disturbance - evaluation and management  Sommer,Steven Eugene 05/24/2018, 8:20 PM

## 2018-05-24 NOTE — Progress Notes (Signed)
Wasted 45 mls of fentanyl and 25 mls of versed with Izetta Dakin, RN.  Joellen Jersey, RN

## 2018-05-24 NOTE — Consult Note (Addendum)
[facility] Renal Consultation Note  Requesting MD: Dr. Leory Plowman Icard Indication for Consultation: Consideration for CRRT due to acidosis   HPI: Howard Thompson is a 52 y.o. male w/ a PMHx notable CAD prior MI, HTN, myasthenia gravis s/p thymectomy, who presented to the ED via EMS following sudden cardiac arrest and found to have anterior ST elevation on EKG prompting LHC which was subsequently stented with a DES for a 100% LAD occlusion with good post procedural flow. The patient was found after an uncertain period of time prior to initiation of bystander CPR. He was defibrillated and treated with epinephrine for cardiac arrest by EMS multiple times prior to [place] and transportation. Uncertain prognosis but with persistent acidosis 2/2 elevated lactate (11.8) and not responding to sodium bicarbonate infusion with decreasing renal output. Nephrology was consulted for consideration of CRRT while monitoring patient for recovery.   Creatinine, Ser  Date/Time Value Ref Range Status  05/24/2018 06:20 AM 2.16 (H) 0.61 - 1.24 mg/dL Final  05/24/2018 04:12 AM 2.01 (H) 0.61 - 1.24 mg/dL Final  05/23/2018 10:53 PM 1.81 (H) 0.61 - 1.24 mg/dL Final  05/23/2018 08:15 PM 1.53 (H) 0.61 - 1.24 mg/dL Final  05/23/2018 04:13 PM 1.25 (H) 0.61 - 1.24 mg/dL Final  05/23/2018 12:06 PM 1.01 0.61 - 1.24 mg/dL Final  05/23/2018 08:22 AM 1.10 0.61 - 1.24 mg/dL Final  05/23/2018 04:39 AM 1.14 0.61 - 1.24 mg/dL Final  05/23/2018 01:53 AM 1.00 0.61 - 1.24 mg/dL Final  05/23/2018 12:41 AM 0.60 (L) 0.61 - 1.24 mg/dL Final  05/22/2018 08:27 PM 0.99 0.61 - 1.24 mg/dL Final    Comment:    QA FLAGS AND/OR RANGES MODIFIED BY DEMOGRAPHIC UPDATE ON 07/27 AT 2143  05/22/2018 08:03 PM 0.90 0.61 - 1.24 mg/dL Final    Comment:    QA FLAGS AND/OR RANGES MODIFIED BY DEMOGRAPHIC UPDATE ON 07/27 AT 2143  05/22/2018 05:44 PM 1.23 0.61 - 1.24 mg/dL Final    Comment:    QA FLAGS AND/OR RANGES MODIFIED BY DEMOGRAPHIC UPDATE ON  07/27 AT 2143   PMHx:  History reviewed. No pertinent past medical history.  Past Surgical History:  Procedure Laterality Date  . CORONARY STENT INTERVENTION N/A 05/22/2018   Procedure: CORONARY STENT INTERVENTION;  Surgeon: Lorretta Harp, MD;  Location: [facility];  Service: Cardiovascular;  Laterality: N/A;  . LEFT HEART CATH AND CORONARY ANGIOGRAPHY N/A 05/22/2018   Procedure: LEFT HEART CATH AND CORONARY ANGIOGRAPHY;  Surgeon: Lorretta Harp, MD;  Location: [facility];  Service: Cardiovascular;  Laterality: N/A;    Family Hx: No family history on file.  Social History:  has no tobacco, alcohol, and drug history on file.  Allergies: No Known Allergies  Medications: Prior to Admission medications   Medication Sig Start Date End Date Taking? Authorizing Provider  nitroGLYCERIN (NITROSTAT) 0.4 MG SL tablet Place 0.4 mg under the tongue every 5 (five) minutes as needed for chest pain.   Yes [provider]    I have reviewed the patient's current medications.  Labs:  Results for orders placed or performed during the hospital encounter of 05/22/18 (from the past 48 hour(s))  I-stat troponin, ED     Status: Abnormal   Collection Time: 05/22/18  5:36 PM  Result Value Ref Range   Troponin i, poc 0.11 (HH) 0.00 - 0.08 ng/mL   Comment NOTIFIED PHYSICIAN    Comment 3            Comment:  Due to the release kinetics of cTnI, a negative result within the first hours of the onset of symptoms does not rule out myocardial infarction with certainty. If myocardial infarction is still suspected, repeat the test at appropriate intervals.   Basic metabolic panel     Status: Abnormal   Collection Time: 05/22/18  5:44 PM  Result Value Ref Range   Sodium 139 135 - 145 mmol/L   Potassium 4.5 3.5 - 5.1 mmol/L   Chloride 109 98 - 111 mmol/L   CO2 14 (L) 22 - 32 mmol/L   Glucose, Bld 186 (H) 70 - 99 mg/dL   BUN 18 6 - 20 mg/dL    Comment: QA FLAGS AND/OR RANGES  MODIFIED BY DEMOGRAPHIC UPDATE ON 07/27 AT 2143   Creatinine, Ser 1.23 0.61 - 1.24 mg/dL    Comment: QA FLAGS AND/OR RANGES MODIFIED BY DEMOGRAPHIC UPDATE ON 07/27 AT 2143   Calcium 8.2 (L) 8.9 - 10.3 mg/dL   GFR calc non Af Amer 26 (L) >60 mL/min   GFR calc Af Amer 30 (L) >60 mL/min    Comment: (NOTE) The eGFR has been calculated using the CKD EPI equation. This calculation has not been validated in all clinical situations. eGFR's persistently <60 mL/min signify possible Chronic Kidney Disease.    Anion gap 16 (H) 5 - 15    Comment: Performed at [facility], [place] [address], [place], [place] [address]  CBC     Status: Abnormal   Collection Time: 05/22/18  5:44 PM  Result Value Ref Range   WBC 14.7 (H) 4.0 - 10.5 K/uL   RBC 4.81 4.22 - 5.81 MIL/uL    Comment: QA FLAGS AND/OR RANGES MODIFIED BY DEMOGRAPHIC UPDATE ON 07/27 AT 2143   Hemoglobin 13.0 13.0 - 17.0 g/dL    Comment: QA FLAGS AND/OR RANGES MODIFIED BY DEMOGRAPHIC UPDATE ON 07/27 AT 2143   HCT 42.1 39.0 - 52.0 %    Comment: QA FLAGS AND/OR RANGES MODIFIED BY DEMOGRAPHIC UPDATE ON 07/27 AT 2143   MCV 87.5 78.0 - 100.0 fL   MCH 27.0 26.0 - 34.0 pg   MCHC 30.9 30.0 - 36.0 g/dL   RDW 18.8 (H) 11.5 - 15.5 %   Platelets 341 150 - 400 K/uL    Comment: Performed at [facility], [place] [address], [place], [place] [address]  Protime-INR     Status: None   Collection Time: 05/22/18  5:44 PM  Result Value Ref Range   Prothrombin Time 13.8 11.4 - 15.2 seconds   INR 1.07     Comment: Performed at [facility] [address] [place], [place] [address]  APTT     Status: None   Collection Time: 05/22/18  5:44 PM  Result Value Ref Range   aPTT 30 24 - 36 seconds    Comment: Performed at [facility] [address], [place], [place] [address]  Troponin I     Status: Abnormal   Collection Time: 05/22/18  5:44 PM  Result Value Ref Range   Troponin I 0.11 (HH) <0.03 ng/mL    Comment: CRITICAL RESULT CALLED TO,  READ BACK BY AND VERIFIED WITH: A PIFER RN @ [id number] ON 05/22/18 BY HTEMOCHE Performed at [facility], [place] [address], [place], [place] [address]   Lipid panel     Status: Abnormal   Collection Time: 05/22/18  5:45 PM  Result Value Ref Range   Cholesterol 125 0 -  200 mg/dL   Triglycerides 57 <150 mg/dL   HDL 40 (L) >40 mg/dL   Total CHOL/HDL Ratio 3.1 RATIO   VLDL 11 0 - 40 mg/dL   LDL Cholesterol 74 0 - 99 mg/dL    Comment:        Total Cholesterol/HDL:CHD Risk Coronary Heart Disease Risk Table                     Men   Women  1/2 Average Risk   3.4   3.3  Average Risk       5.0   4.4  2 X Average Risk   9.6   7.1  3 X Average Risk  23.4   11.0        Use the calculated Patient Ratio above and the CHD Risk Table to determine the patient's CHD Risk.        ATP III CLASSIFICATION (LDL):  <100     mg/dL   Optimal  100-129  mg/dL   Near or Above                    Optimal  130-159  mg/dL   Borderline  160-189  mg/dL   High  >190     mg/dL   Very High Performed at [facility] [address] [place], [place] [address]   I-STAT, [place] 8     Status: Abnormal   Collection Time: 05/22/18  8:03 PM  Result Value Ref Range   Sodium 140 135 - 145 mmol/L   Potassium 4.3 3.5 - 5.1 mmol/L   Chloride 107 98 - 111 mmol/L   BUN 21 (H) 6 - 20 mg/dL    Comment: QA FLAGS AND/OR RANGES MODIFIED BY DEMOGRAPHIC UPDATE ON 07/27 AT 2143   Creatinine, Ser 0.90 0.61 - 1.24 mg/dL    Comment: QA FLAGS AND/OR RANGES MODIFIED BY DEMOGRAPHIC UPDATE ON 07/27 AT 2143   Glucose, Bld 187 (H) 70 - 99 mg/dL   Calcium, Ion 1.19 1.15 - 1.40 mmol/L   TCO2 19 (L) 22 - 32 mmol/L   Hemoglobin 15.6 13.0 - 17.0 g/dL    Comment: QA FLAGS AND/OR RANGES MODIFIED BY DEMOGRAPHIC UPDATE ON 07/27 AT 2143   HCT 46.0 39.0 - 52.0 %    Comment: QA FLAGS AND/OR RANGES MODIFIED BY DEMOGRAPHIC UPDATE ON 07/27 AT 2143  I-STAT 3, arterial blood gas (G3+)     Status: Abnormal   Collection Time: 05/22/18  8:10 PM   Result Value Ref Range   pH, Arterial 7.292 (L) 7.350 - 7.450   pCO2 arterial 36.3 32.0 - 48.0 mmHg   pO2, Arterial 54.0 (L) 83.0 - 108.0 mmHg   Bicarbonate 17.7 (L) 20.0 - 28.0 mmol/L   TCO2 19 (L) 22 - 32 mmol/L   O2 Saturation 86.0 %   Acid-base deficit 8.0 (H) 0.0 - 2.0 mmol/L   Patient temperature 36.3 C    Collection site ARTERIAL LINE    Sample type ARTERIAL   Urinalysis, Routine w reflex microscopic     Status: Abnormal   Collection Time: 05/22/18  8:24 PM  Result Value Ref Range   Color, Urine YELLOW YELLOW   APPearance CLEAR CLEAR   Specific Gravity, Urine >1.046 (H) 1.005 - 1.030   pH 5.0 5.0 - 8.0   Glucose, UA NEGATIVE NEGATIVE mg/dL   Hgb urine dipstick LARGE (A) NEGATIVE   Bilirubin Urine NEGATIVE NEGATIVE   Ketones, ur NEGATIVE  NEGATIVE mg/dL   Protein, ur 30 (A) NEGATIVE mg/dL   Nitrite NEGATIVE NEGATIVE   Leukocytes, UA NEGATIVE NEGATIVE   RBC / HPF 11-20 0 - 5 RBC/hpf   WBC, UA 0-5 0 - 5 WBC/hpf   Bacteria, UA NONE SEEN NONE SEEN   Mucus PRESENT     Comment: Performed at [facility] [address], [place], [place] [address]  MRSA PCR Screening     Status: None   Collection Time: 05/22/18  8:24 PM  Result Value Ref Range   MRSA by PCR NEGATIVE NEGATIVE    Comment:        The GeneXpert MRSA Assay (FDA approved for NASAL specimens only), is one component of a comprehensive MRSA colonization surveillance program. It is not intended to diagnose MRSA infection nor to guide or monitor treatment for MRSA infections. Performed at [facility], [place] [address] [place], [place] [address]   Triglycerides     Status: None   Collection Time: 05/22/18  8:26 PM  Result Value Ref Range   Triglycerides 59 <150 mg/dL    Comment: Performed at [facility] [address], [place], [place] [address]  Troponin I (q 6hr x 3)     Status: Abnormal   Collection Time: 05/22/18  8:27 PM  Result Value Ref Range   Troponin I 1.19 (HH) <0.03 ng/mL     Comment: CRITICAL RESULT CALLED TO, READ BACK BY AND VERIFIED WITH: GASQUE,J RN 05/22/2018 2203 JORDANS DELTA CHECK NOTED Performed at [facility], [place] [address], [place], [place] [address]   T4, free     Status: None   Collection Time: 05/22/18  8:27 PM  Result Value Ref Range   Free T4 1.01 0.82 - 1.77 ng/dL    Comment: (NOTE) Biotin ingestion may interfere with free T4 tests. If the results are inconsistent with the TSH level, previous test results, or the clinical presentation, then consider biotin interference. If needed, order repeat testing after stopping biotin. Performed at [facility], [place] [address], [place], [place] [address]   TSH     Status: None   Collection Time: 05/22/18  8:27 PM  Result Value Ref Range   TSH 1.044 0.350 - 4.500 uIU/mL    Comment: Performed by a 3rd Generation assay with a functional sensitivity of <=0.01 uIU/mL. Performed at [facility], [place] [address], [place], [place] [address]   Hemoglobin A1c     Status: Abnormal   Collection Time: 05/22/18  8:27 PM  Result Value Ref Range   Hgb A1c MFr Bld 5.7 (H) 4.8 - 5.6 %    Comment: (NOTE) Pre diabetes:          5.7%-6.4% Diabetes:              >6.4% Glycemic control for   <7.0% adults with diabetes    Mean Plasma Glucose 116.89 mg/dL    Comment: Performed at [facility] [address], [place], [place] [address]  Brain natriuretic peptide     Status: Abnormal   Collection Time: 05/22/18  8:27 PM  Result Value Ref Range   B Natriuretic Peptide 276.5 (H) 0.0 - 100.0 pg/mL    Comment: Performed at [facility] [name] [place],  [address]  Basic metabolic panel     Status: Abnormal   Collection Time: 05/22/18  8:27 PM  Result Value Ref Range   Sodium  138 135 - 145 mmol/L   Potassium 3.9 3.5 - 5.1 mmol/L   Chloride 108 98 - 111 mmol/L   CO2 19 (L) 22 - 32 mmol/L   Glucose, Bld 188 (H) 70 - 99 mg/dL   BUN 20 6 - 20 mg/dL    Comment: QA FLAGS  AND/OR RANGES MODIFIED BY DEMOGRAPHIC UPDATE ON 07/27 AT 2143   Creatinine, Ser 0.99 0.61 - 1.24 mg/dL    Comment: QA FLAGS AND/OR RANGES MODIFIED BY DEMOGRAPHIC UPDATE ON 07/27 AT 2143   Calcium 8.3 (L) 8.9 - 10.3 mg/dL   GFR calc non Af Amer 33 (L) >60 mL/min   GFR calc Af Amer 38 (L) >60 mL/min    Comment: (NOTE) The eGFR has been calculated using the CKD EPI equation. This calculation has not been validated in all clinical situations. eGFR's persistently <60 mL/min signify possible Chronic Kidney Disease.    Anion gap 11 5 - 15    Comment: Performed at [facility] [address], [place], [place] [address]  Protime-INR now and repeat in 8 hours     Status: Abnormal   Collection Time: 05/22/18  8:27 PM  Result Value Ref Range   Prothrombin Time 49.9 (H) 11.4 - 15.2 seconds    Comment: REPEATED TO VERIFY   INR 5.53 (HH)     Comment: REPEATED TO VERIFY CRITICAL RESULT CALLED TO, READ BACK BY AND VERIFIED WITH: J.GASQUE,RN 2236 05/22/18 [email or web address] Performed at [facility], [place] [address] [place], [place] [address]   APTT now and repeat in 8 hours     Status: Abnormal   Collection Time: 05/22/18  8:27 PM  Result Value Ref Range   aPTT 122 (H) 24 - 36 seconds    Comment:        IF BASELINE aPTT IS ELEVATED, SUGGEST PATIENT RISK ASSESSMENT BE USED TO DETERMINE APPROPRIATE ANTICOAGULANT THERAPY. Performed at [facility], [place] [address], [place], [place] [address]   Lactic acid, plasma     Status: Abnormal   Collection Time: 05/22/18  8:27 PM  Result Value Ref Range   Lactic Acid, Venous 2.8 (HH) 0.5 - 1.9 mmol/L    Comment: CRITICAL RESULT CALLED TO, READ BACK BY AND VERIFIED WITH: GASQUE,J RN 05/22/2018 2156 JORDANS Performed at [facility], [place] [address], [place], [place] [address]   Procalcitonin - Baseline     Status: None   Collection Time: 05/22/18  8:27 PM  Result Value Ref Range   Procalcitonin 0.14 ng/mL    Comment:         Interpretation: PCT (Procalcitonin) <= 0.5 ng/mL: Systemic infection (sepsis) is not likely. Local bacterial infection is possible. (NOTE)       Sepsis PCT Algorithm           Lower Respiratory Tract                                      Infection PCT Algorithm    ----------------------------     ----------------------------         PCT < 0.25 ng/mL                PCT < 0.10 ng/mL         Strongly encourage             Strongly discourage   discontinuation of antibiotics  initiation of antibiotics    ----------------------------     -----------------------------       PCT 0.25 - 0.50 ng/mL            PCT 0.10 - 0.25 ng/mL               OR       >80% decrease in PCT            Discourage initiation of                                            antibiotics      Encourage discontinuation           of antibiotics    ----------------------------     -----------------------------         PCT >= 0.50 ng/mL              PCT 0.26 - 0.50 ng/mL               AND        <80% decrease in PCT             Encourage initiation of                                             antibiotics       Encourage continuation           of antibiotics    ----------------------------     -----------------------------        PCT >= 0.50 ng/mL                  PCT > 0.50 ng/mL               AND         increase in PCT                  Strongly encourage                                      initiation of antibiotics    Strongly encourage escalation           of antibiotics                                     -----------------------------                                           PCT <= 0.25 ng/mL                                                 OR                                        >  80% decrease in PCT                                     Discontinue / Do not initiate                                             antibiotics Performed at [facility], [place] [address], [place], [place] [address]    Magnesium     Status: None   Collection Time: 05/22/18  8:27 PM  Result Value Ref Range   Magnesium 2.0 1.7 - 2.4 mg/dL    Comment: Performed at [facility], [place] [address], [place], [place] [address]  Phosphorus     Status: None   Collection Time: 05/22/18  8:27 PM  Result Value Ref Range   Phosphorus 4.1 2.5 - 4.6 mg/dL    Comment: Performed at [facility], [place] [address] [place], [place] [address]  Urine culture     Status: None   Collection Time: 05/22/18  8:35 PM  Result Value Ref Range   Specimen Description URINE, RANDOM    Special Requests NONE    Culture      NO GROWTH Performed at [facility], [place] [address], [place], [place] [address]    Report Status 05/24/2018 FINAL   Culture, respiratory (tracheal aspirate)     Status: None (Preliminary result)   Collection Time: 05/22/18  8:35 PM  Result Value Ref Range   Specimen Description TRACHEAL ASPIRATE    Special Requests NONE    Gram Stain      FEW WBC PRESENT, PREDOMINANTLY PMN ABUNDANT GRAM POSITIVE COCCI IN CHAINS IN PAIRS FEW GRAM NEGATIVE RODS FEW YEAST    Culture      CULTURE REINCUBATED FOR BETTER GROWTH Performed at [facility], [place] [address] [place], [place] [address]    Report Status PENDING   Culture, blood (routine x 2)     Status: None (Preliminary result)   Collection Time: 05/22/18  9:57 PM  Result Value Ref Range   Specimen Description BLOOD    Special Requests      FROM SHEATH BOTTLES DRAWN AEROBIC AND ANAEROBIC Blood Culture adequate volume   Culture      NO GROWTH < 24 HOURS Performed at [facility], [place] [address], [place], [place] [address]    Report Status PENDING   Glucose, capillary     Status: Abnormal   Collection Time: 05/22/18  9:57 PM  Result Value Ref Range   Glucose-Capillary 124 (H) 70 - 99 mg/dL   Comment 1 Notify RN   Blood gas, arterial     Status: Abnormal (Preliminary result)   Collection Time: 05/22/18 10:00 PM  Result Value Ref  Range   FIO2 100.00    O2 Content PENDING L/min   Delivery systems VENTILATOR    Mode PRESSURE CONTROL    LHR 20.0 resp/min   Hi Frequency JET Vent Rate 18.0    Peep/cpap 5.0 cm H20   pH, Arterial 7.246 (L) 7.350 - 7.450   pCO2 arterial 44.0 32.0 - 48.0 mmHg   pO2, Arterial 95.1 83.0 - 108.0 mmHg   Bicarbonate 18.4 (L) 20.0 - 28.0 mmol/L   Acid-base deficit 7.6 (H) 0.0 -  2.0 mmol/L   O2 Saturation 95.0 %   Patient temperature 98.6    Collection site ARTERIAL DRAW    Drawn by [phone]    Sample type ARTERIAL DRAW    Allens test (pass/fail) PASS PASS  Culture, blood (routine x 2)     Status: None (Preliminary result)   Collection Time: 05/22/18 10:03 PM  Result Value Ref Range   Specimen Description BLOOD CENTRAL LINE    Special Requests      BOTTLES DRAWN AEROBIC AND ANAEROBIC Blood Culture adequate volume   Culture      NO GROWTH < 24 HOURS Performed at [facility], [address] [place], [place] [address]    Report Status PENDING   I-STAT, chem 8     Status: Abnormal   Collection Time: 05/23/18 12:41 AM  Result Value Ref Range   Sodium 140 135 - 145 mmol/L   Potassium 7.7 (HH) 3.5 - 5.1 mmol/L   Chloride 104 98 - 111 mmol/L   BUN 30 (H) 6 - 20 mg/dL   Creatinine, Ser 0.60 (L) 0.61 - 1.24 mg/dL   Glucose, Bld 164 (H) 70 - 99 mg/dL   Calcium, Ion 0.50 (LL) 1.15 - 1.40 mmol/L   TCO2 19 (L) 22 - 32 mmol/L   Hemoglobin 12.6 (L) 13.0 - 17.0 g/dL   HCT 37.0 (L) 39.0 - 52.0 %  Lactic acid, plasma     Status: Abnormal   Collection Time: 05/23/18 12:45 AM  Result Value Ref Range   Lactic Acid, Venous 2.3 (HH) 0.5 - 1.9 mmol/L    Comment: CRITICAL RESULT CALLED TO, READ BACK BY AND VERIFIED WITH: GASQUE,J RN 05/23/2018 0144 JORDANS Performed at [facility], [address], [place], [place] [address]   Urine rapid drug screen (hosp performed)     Status: Abnormal   Collection Time: 05/23/18 12:45 AM  Result Value Ref Range   Opiates NONE DETECTED NONE DETECTED    Cocaine POSITIVE (A) NONE DETECTED   Benzodiazepines POSITIVE (A) NONE DETECTED   Amphetamines NONE DETECTED NONE DETECTED   Tetrahydrocannabinol NONE DETECTED NONE DETECTED   Barbiturates NONE DETECTED NONE DETECTED    Comment: (NOTE) DRUG SCREEN FOR MEDICAL PURPOSES ONLY.  IF CONFIRMATION IS NEEDED FOR ANY PURPOSE, NOTIFY LAB WITHIN 5 DAYS. LOWEST DETECTABLE LIMITS FOR URINE DRUG SCREEN Drug Class                     Cutoff (ng/mL) Amphetamine and metabolites    1000 Barbiturate and metabolites    200 Benzodiazepine                 600 Tricyclics and metabolites     300 Opiates and metabolites        300 Cocaine and metabolites        300 THC                            50 Performed at [facility], [place] [address], [place], [place] [address]   Glucose, capillary     Status: Abnormal   Collection Time: 05/23/18 12:46 AM  Result Value Ref Range   Glucose-Capillary 163 (H) 70 - 99 mg/dL   Comment 1 Notify RN   Blood gas, arterial     Status: Abnormal   Collection Time: 05/23/18  1:15 AM  Result Value Ref Range   FIO2 100.00  Delivery systems VENTILATOR    Mode PRESSURE REGULATED VOLUME CONTROL    VT 580 mL   LHR 26 resp/min   Peep/cpap 14.0 cm H20   pH, Arterial 7.269 (L) 7.350 - 7.450   pCO2 arterial 39.0 32.0 - 48.0 mmHg   pO2, Arterial 199 (H) 83.0 - 108.0 mmHg   Bicarbonate 17.3 (L) 20.0 - 28.0 mmol/L   Acid-base deficit 8.3 (H) 0.0 - 2.0 mmol/L   O2 Saturation 98.7 %   Patient temperature 98.6    Collection site A-LINE    Drawn by DRAWN BY RN    Sample type ARTERIAL DRAW   POCT Activated clotting time     Status: None   Collection Time: 05/23/18  1:24 AM  Result Value Ref Range   Activated Clotting Time 0 seconds    Comment: QUESTIONABLE RESULTS - CHARGE CREDITED Performed at [facility], [place] [address] [place], [place] [address]   Glucose, capillary     Status: Abnormal   Collection Time: 05/23/18  1:47 AM  Result Value Ref Range    Glucose-Capillary 253 (H) 70 - 99 mg/dL   Comment 1 Notify RN   POCT Activated clotting time     Status: None   Collection Time: 05/23/18  1:48 AM  Result Value Ref Range   Activated Clotting Time >1,000 seconds    Comment: QUESTIONABLE RESULTS - CHARGE CREDITED Performed at [facility], [place] [address], [place], [place] [address]   I-STAT, [place] 8     Status: Abnormal   Collection Time: 05/23/18  1:53 AM  Result Value Ref Range   Sodium 140 135 - 145 mmol/L   Potassium 4.2 3.5 - 5.1 mmol/L   Chloride 109 98 - 111 mmol/L   BUN 26 (H) 6 - 20 mg/dL   Creatinine, Ser 1.00 0.61 - 1.24 mg/dL   Glucose, Bld 264 (H) 70 - 99 mg/dL   Calcium, Ion 1.18 1.15 - 1.40 mmol/L   TCO2 18 (L) 22 - 32 mmol/L   Hemoglobin 14.6 13.0 - 17.0 g/dL   HCT 43.0 39.0 - 52.0 %  POCT Activated clotting time     Status: None   Collection Time: 05/23/18  2:29 AM  Result Value Ref Range   Activated Clotting Time 191 seconds  POCT Activated clotting time     Status: None   Collection Time: 05/23/18  3:10 AM  Result Value Ref Range   Activated Clotting Time 175 seconds  POCT Activated clotting time     Status: None   Collection Time: 05/23/18  3:52 AM  Result Value Ref Range   Activated Clotting Time 164 seconds  Glucose, capillary     Status: Abnormal   Collection Time: 05/23/18  3:53 AM  Result Value Ref Range   Glucose-Capillary 191 (H) 70 - 99 mg/dL   Comment 1 Notify RN   Blood gas, arterial     Status: Abnormal   Collection Time: 05/23/18  4:38 AM  Result Value Ref Range   FIO2 70.00    Delivery systems VENTILATOR    Mode PRESSURE REGULATED VOLUME CONTROL    VT 580 mL   LHR 26 resp/min   Peep/cpap 12.0 cm H20   pH, Arterial 7.343 (L) 7.350 - 7.450   pCO2 arterial 37.9 32.0 - 48.0 mmHg   pO2, Arterial 104 83.0 - 108.0 mmHg   Bicarbonate 20.1 20.0 - 28.0 mmol/L   Acid-base deficit 4.7 (H) 0.0 - 2.0 mmol/L  O2 Saturation 97.0 %   Patient temperature 98.6    Collection site A-LINE     Drawn by [phone]    Sample type ARTERIAL DRAW    Allens test (pass/fail) PASS PASS  Troponin I (q 6hr x 3)     Status: Abnormal   Collection Time: 05/23/18  4:39 AM  Result Value Ref Range   Troponin I 2.05 (HH) <0.03 ng/mL    Comment: CRITICAL VALUE NOTED.  VALUE IS CONSISTENT WITH PREVIOUSLY REPORTED AND CALLED VALUE. Performed at [facility], [place] [address] [place], [place] [address]   CBC     Status: Abnormal   Collection Time: 05/23/18  4:39 AM  Result Value Ref Range   WBC 5.7 4.0 - 10.5 K/uL   RBC 5.01 4.22 - 5.81 MIL/uL   Hemoglobin 13.4 13.0 - 17.0 g/dL   HCT 42.1 39.0 - 52.0 %   MCV 84.0 78.0 - 100.0 fL   MCH 26.7 26.0 - 34.0 pg   MCHC 31.8 30.0 - 36.0 g/dL   RDW 18.6 (H) 11.5 - 15.5 %   Platelets 272 150 - 400 K/uL    Comment: Performed at [facility], [place] [address] [place], [place] [address]  Magnesium     Status: None   Collection Time: 05/23/18  4:39 AM  Result Value Ref Range   Magnesium 2.0 1.7 - 2.4 mg/dL    Comment: Performed at [facility] [address] [place], [place] [address]  Phosphorus     Status: None   Collection Time: 05/23/18  4:39 AM  Result Value Ref Range   Phosphorus 2.6 2.5 - 4.6 mg/dL    Comment: Performed at [facility] [address] [place], [place] [address]  Basic metabolic panel     Status: Abnormal   Collection Time: 05/23/18  4:39 AM  Result Value Ref Range   Sodium 142 135 - 145 mmol/L   Potassium 2.8 (L) 3.5 - 5.1 mmol/L   Chloride 109 98 - 111 mmol/L   CO2 22 22 - 32 mmol/L   Glucose, Bld 176 (H) 70 - 99 mg/dL   BUN 24 (H) 6 - 20 mg/dL   Creatinine, Ser 1.14 0.61 - 1.24 mg/dL   Calcium 8.0 (L) 8.9 - 10.3 mg/dL   GFR calc non Af Amer >60 >60 mL/min   GFR calc Af Amer >60 >60 mL/min    Comment: (NOTE) The eGFR has been calculated using the CKD EPI equation. This calculation has not been validated in all clinical situations. eGFR's persistently <60 mL/min signify possible Chronic  Kidney Disease.    Anion gap 11 5 - 15    Comment: Performed at [facility] [address] [place], [place] [address]  Protime-INR now and repeat in 8 hours     Status: Abnormal   Collection Time: 05/23/18  4:39 AM  Result Value Ref Range   Prothrombin Time 18.4 (H) 11.4 - 15.2 seconds   INR 1.55     Comment: Performed at [facility] [address], [place],  [address]  APTT now and repeat in 8 hours     Status: Abnormal   Collection Time: 05/23/18  4:39 AM  Result Value Ref Range   aPTT 59 (H) 24 - 36 seconds    Comment:        IF BASELINE aPTT IS ELEVATED, SUGGEST PATIENT RISK ASSESSMENT BE USED TO DETERMINE APPROPRIATE ANTICOAGULANT THERAPY.  Performed at [facility], [place] [address], [place], [place] [address]   Troponin I     Status: Abnormal   Collection Time: 05/23/18  8:22 AM  Result Value Ref Range   Troponin I 1.69 (HH) <0.03 ng/mL    Comment: CRITICAL VALUE NOTED.  VALUE IS CONSISTENT WITH PREVIOUSLY REPORTED AND CALLED VALUE. Performed at [facility], [place] [address], [place], [place] [address]   Basic metabolic panel     Status: Abnormal   Collection Time: 05/23/18  8:22 AM  Result Value Ref Range   Sodium 142 135 - 145 mmol/L   Potassium 3.3 (L) 3.5 - 5.1 mmol/L   Chloride 111 98 - 111 mmol/L   CO2 20 (L) 22 - 32 mmol/L   Glucose, Bld 141 (H) 70 - 99 mg/dL   BUN 22 (H) 6 - 20 mg/dL   Creatinine, Ser 1.10 0.61 - 1.24 mg/dL   Calcium 7.3 (L) 8.9 - 10.3 mg/dL   GFR calc non Af Amer >60 >60 mL/min   GFR calc Af Amer >60 >60 mL/min    Comment: (NOTE) The eGFR has been calculated using the CKD EPI equation. This calculation has not been validated in all clinical situations. eGFR's persistently <60 mL/min signify possible Chronic Kidney Disease.    Anion gap 11 5 - 15    Comment: Performed at [facility] [address], [place], [place] [address]  Glucose, capillary     Status: Abnormal   Collection Time: 05/23/18   8:29 AM  Result Value Ref Range   Glucose-Capillary 140 (H) 70 - 99 mg/dL   Comment 1 Arterial Specimen   Glucose, capillary     Status: None   Collection Time: 05/23/18 10:16 AM  Result Value Ref Range   Glucose-Capillary 90 70 - 99 mg/dL   Comment 1 Arterial Specimen   Glucose, capillary     Status: Abnormal   Collection Time: 05/23/18 12:04 PM  Result Value Ref Range   Glucose-Capillary 161 (H) 70 - 99 mg/dL  Troponin I     Status: Abnormal   Collection Time: 05/23/18 12:06 PM  Result Value Ref Range   Troponin I 1.74 (HH) <0.03 ng/mL    Comment: CRITICAL VALUE NOTED.  VALUE IS CONSISTENT WITH PREVIOUSLY REPORTED AND CALLED VALUE. Performed at [facility], [place] [address] [place], [place] [address]   Basic metabolic panel     Status: Abnormal   Collection Time: 05/23/18 12:06 PM  Result Value Ref Range   Sodium 141 135 - 145 mmol/L   Potassium 3.7 3.5 - 5.1 mmol/L   Chloride 111 98 - 111 mmol/L   CO2 19 (L) 22 - 32 mmol/L   Glucose, Bld 154 (H) 70 - 99 mg/dL   BUN 23 (H) 6 - 20 mg/dL   Creatinine, Ser 1.01 0.61 - 1.24 mg/dL   Calcium 7.3 (L) 8.9 - 10.3 mg/dL   GFR calc non Af Amer >60 >60 mL/min   GFR calc Af Amer >60 >60 mL/min    Comment: (NOTE) The eGFR has been calculated using the CKD EPI equation. This calculation has not been validated in all clinical situations. eGFR's persistently <60 mL/min signify possible Chronic Kidney Disease.    Anion gap 11 5 - 15    Comment: Performed at [facility] [address] [place], [place] [address]  I-STAT 3, arterial blood gas (G3+)     Status: Abnormal   Collection  Time: 05/23/18  1:04 PM  Result Value Ref Range   pH, Arterial 7.388 7.350 - 7.450   pCO2 arterial 27.9 (L) 32.0 - 48.0 mmHg   pO2, Arterial 73.0 (L) 83.0 - 108.0 mmHg   Bicarbonate 17.5 (L) 20.0 - 28.0 mmol/L   TCO2 19 (L) 22 - 32 mmol/L   O2 Saturation 97.0 %   Acid-base deficit 7.0 (H) 0.0 - 2.0 mmol/L   Patient temperature 33.1 C     Sample type ARTERIAL   Glucose, capillary     Status: Abnormal   Collection Time: 05/23/18  2:13 PM  Result Value Ref Range   Glucose-Capillary 131 (H) 70 - 99 mg/dL  Glucose, capillary     Status: Abnormal   Collection Time: 05/23/18  4:10 PM  Result Value Ref Range   Glucose-Capillary 122 (H) 70 - 99 mg/dL   Comment 1 Arterial Specimen   Basic metabolic panel     Status: Abnormal   Collection Time: 05/23/18  4:13 PM  Result Value Ref Range   Sodium 141 135 - 145 mmol/L   Potassium 5.4 (H) 3.5 - 5.1 mmol/L    Comment: DELTA CHECK NOTED   Chloride 112 (H) 98 - 111 mmol/L   CO2 16 (L) 22 - 32 mmol/L   Glucose, Bld 131 (H) 70 - 99 mg/dL   BUN 25 (H) 6 - 20 mg/dL   Creatinine, Ser 1.25 (H) 0.61 - 1.24 mg/dL   Calcium 7.4 (L) 8.9 - 10.3 mg/dL   GFR calc non Af Amer >60 >60 mL/min   GFR calc Af Amer >60 >60 mL/min    Comment: (NOTE) The eGFR has been calculated using the CKD EPI equation. This calculation has not been validated in all clinical situations. eGFR's persistently <60 mL/min signify possible Chronic Kidney Disease.    Anion gap 13 5 - 15    Comment: Performed at [facility] [address], [place], [place] [address]  Glucose, capillary     Status: None   Collection Time: 05/23/18  6:15 PM  Result Value Ref Range   Glucose-Capillary 93 70 - 99 mg/dL   Comment 1 Arterial Specimen   .Cooxemetry Panel (carboxy, met, total hgb, O2 sat)     Status: None   Collection Time: 05/23/18  7:14 PM  Result Value Ref Range   Total hemoglobin 12.3 12.0 - 16.0 g/dL   O2 Saturation 45.6 %   Carboxyhemoglobin 0.6 0.5 - 1.5 %   Methemoglobin 1.0 0.0 - 1.5 %  Basic metabolic panel     Status: Abnormal   Collection Time: 05/23/18  8:15 PM  Result Value Ref Range   Sodium 141 135 - 145 mmol/L   Potassium 5.6 (H) 3.5 - 5.1 mmol/L    Comment: HEMOLYSIS AT THIS LEVEL MAY AFFECT RESULT   Chloride 111 98 - 111 mmol/L   CO2 17 (L) 22 - 32 mmol/L   Glucose, Bld 86 70 - 99 mg/dL    BUN 25 (H) 6 - 20 mg/dL   Creatinine, Ser 1.53 (H) 0.61 - 1.24 mg/dL   Calcium 7.3 (L) 8.9 - 10.3 mg/dL   GFR calc non Af Amer 51 (L) >60 mL/min   GFR calc Af Amer 59 (L) >60 mL/min    Comment: (NOTE) The eGFR has been calculated using the CKD EPI equation. This calculation has not been validated in all clinical situations. eGFR's persistently <60 mL/min signify possible Chronic Kidney Disease.    Anion gap 13  5 - 15    Comment: Performed at [facility], [place] [address] [place], [place] [address]  Glucose, capillary     Status: None   Collection Time: 05/23/18  8:18 PM  Result Value Ref Range   Glucose-Capillary 74 70 - 99 mg/dL   Comment 1 Notify RN   I-STAT 3, arterial blood gas (G3+)     Status: Abnormal   Collection Time: 05/23/18  8:35 PM  Result Value Ref Range   pH, Arterial 7.232 (L) 7.350 - 7.450   pCO2 arterial 41.3 32.0 - 48.0 mmHg   pO2, Arterial 29.0 (LL) 83.0 - 108.0 mmHg   Bicarbonate 18.3 (L) 20.0 - 28.0 mmol/L   TCO2 20 (L) 22 - 32 mmol/L   O2 Saturation 59.0 %   Acid-base deficit 10.0 (H) 0.0 - 2.0 mmol/L   Patient temperature 33.0 C    Sample type ARTERIAL   I-STAT 4, (NA,K, GLUC, HGB,HCT)     Status: Abnormal   Collection Time: 05/23/18  9:21 PM  Result Value Ref Range   Sodium 147 (H) 135 - 145 mmol/L   Potassium 3.0 (L) 3.5 - 5.1 mmol/L   Glucose, Bld 108 (H) 70 - 99 mg/dL   HCT 34.0 (L) 39.0 - 52.0 %   Hemoglobin 11.6 (L) 13.0 - 17.0 g/dL  I-STAT 3, arterial blood gas (G3+)     Status: Abnormal   Collection Time: 05/23/18 10:06 PM  Result Value Ref Range   pH, Arterial 7.315 (L) 7.350 - 7.450   pCO2 arterial 42.9 32.0 - 48.0 mmHg   pO2, Arterial 108.0 83.0 - 108.0 mmHg   Bicarbonate 23.1 20.0 - 28.0 mmol/L   TCO2 25 22 - 32 mmol/L   O2 Saturation 98.0 %   Acid-base deficit 4.0 (H) 0.0 - 2.0 mmol/L   Patient temperature 32.3 C    Sample type ARTERIAL   Basic metabolic panel     Status: Abnormal   Collection Time: 05/23/18 10:53 PM   Result Value Ref Range   Sodium 145 135 - 145 mmol/L   Potassium 3.1 (L) 3.5 - 5.1 mmol/L   Chloride 108 98 - 111 mmol/L   CO2 21 (L) 22 - 32 mmol/L   Glucose, Bld 154 (H) 70 - 99 mg/dL   BUN 27 (H) 6 - 20 mg/dL   Creatinine, Ser 1.81 (H) 0.61 - 1.24 mg/dL   Calcium 6.5 (L) 8.9 - 10.3 mg/dL   GFR calc non Af Amer 42 (L) >60 mL/min   GFR calc Af Amer 48 (L) >60 mL/min    Comment: (NOTE) The eGFR has been calculated using the CKD EPI equation. This calculation has not been validated in all clinical situations. eGFR's persistently <60 mL/min signify possible Chronic Kidney Disease.    Anion gap 16 (H) 5 - 15    Comment: Performed at [facility], [place] [address] [place], [place] [address]  Magnesium     Status: Abnormal   Collection Time: 05/23/18 10:53 PM  Result Value Ref Range   Magnesium 1.6 (L) 1.7 - 2.4 mg/dL    Comment: Performed at [facility] [address], [place], [place] [address]  CBC with Differential/Platelet     Status: Abnormal   Collection Time: 05/23/18 10:53 PM  Result Value Ref Range   WBC 6.4 4.0 - 10.5 K/uL   RBC 4.10 (L) 4.22 - 5.81 MIL/uL   Hemoglobin 11.2 (L) 13.0 - 17.0 g/dL   HCT  36.0 (L) 39.0 - 52.0 %   MCV 87.8 78.0 - 100.0 fL   MCH 27.3 26.0 - 34.0 pg   MCHC 31.1 30.0 - 36.0 g/dL   RDW 18.9 (H) 11.5 - 15.5 %   Platelets 142 (L) 150 - 400 K/uL   Neutrophils Relative % 90 %   Lymphocytes Relative 4 %   Monocytes Relative 2 %   Eosinophils Relative 4 %   Basophils Relative 0 %   Neutro Abs 5.7 1.7 - 7.7 K/uL   Lymphs Abs 0.3 (L) 0.7 - 4.0 K/uL   Monocytes Absolute 0.1 0.1 - 1.0 K/uL   Eosinophils Absolute 0.3 0.0 - 0.7 K/uL   Basophils Absolute 0.0 0.0 - 0.1 K/uL   RBC Morphology POLYCHROMASIA PRESENT     Comment: MIXED RBC POPULATION RARE NRBCs    WBC Morphology INCREASED BANDS (>20% BANDS)     Comment: VACUOLATED NEUTROPHILS Performed at [facility] [address], [place], [place] [address]   Lactic acid,  plasma     Status: Abnormal   Collection Time: 05/23/18 10:53 PM  Result Value Ref Range   Lactic Acid, Venous 10.2 (HH) 0.5 - 1.9 mmol/L    Comment: CRITICAL RESULT CALLED TO, READ BACK BY AND VERIFIED WITH: GASQUE,J RN 05/23/2018 2340 JORDANS Performed at [facility], [place] [address], [place], [place] [address]   Troponin I     Status: Abnormal   Collection Time: 05/23/18 10:53 PM  Result Value Ref Range   Troponin I 1.99 (HH) <0.03 ng/mL    Comment: CRITICAL VALUE NOTED.  VALUE IS CONSISTENT WITH PREVIOUSLY REPORTED AND CALLED VALUE. Performed at [facility], [place] [address], [place], [place] [address]   Glucose, capillary     Status: Abnormal   Collection Time: 05/23/18 11:27 PM  Result Value Ref Range   Glucose-Capillary 136 (H) 70 - 99 mg/dL   Comment 1 Notify RN   Basic metabolic panel     Status: Abnormal   Collection Time: 05/24/18  4:12 AM  Result Value Ref Range   Sodium 143 135 - 145 mmol/L   Potassium 4.5 3.5 - 5.1 mmol/L    Comment: NO VISIBLE HEMOLYSIS   Chloride 103 98 - 111 mmol/L   CO2 20 (L) 22 - 32 mmol/L   Glucose, Bld 182 (H) 70 - 99 mg/dL   BUN 28 (H) 6 - 20 mg/dL   Creatinine, Ser 2.01 (H) 0.61 - 1.24 mg/dL   Calcium 6.7 (L) 8.9 - 10.3 mg/dL   GFR calc non Af Amer 37 (L) >60 mL/min   GFR calc Af Amer 43 (L) >60 mL/min    Comment: (NOTE) The eGFR has been calculated using the CKD EPI equation. This calculation has not been validated in all clinical situations. eGFR's persistently <60 mL/min signify possible Chronic Kidney Disease.    Anion gap 20 (H) 5 - 15    Comment: Performed at [facility], [place] [address], [place], [place] [address]  Lactic acid, plasma     Status: Abnormal   Collection Time: 05/24/18  4:12 AM  Result Value Ref Range   Lactic Acid, Venous 11.8 (HH) 0.5 - 1.9 mmol/L    Comment: RESULTS CONFIRMED BY MANUAL DILUTION CRITICAL RESULT CALLED TO, READ BACK BY AND VERIFIED WITH: GUIDRY,J RN 05/24/2018 0622  JORDANS CALLED CORRECTED RESULT TO GUIDRY,J RN 05/24/2018 0700 JORDANS Performed at [facility], [place] [address] [place], [place] [address] CORRECTED  ON 07/29 AT 0704: PREVIOUSLY REPORTED AS >30.0 RESULTS CONFIRMED BY MANUAL DILUTION CRITICAL RESULT CALLED TO, READ BACK BY AND VERIFIED WITH: GUIDRY,J RN 05/24/2018 0622 JORDANS   Glucose, capillary     Status: Abnormal   Collection Time: 05/24/18  4:16 AM  Result Value Ref Range   Glucose-Capillary 177 (H) 70 - 99 mg/dL   Comment 1 Notify RN   I-STAT 3, arterial blood gas (G3+)     Status: Abnormal   Collection Time: 05/24/18  5:03 AM  Result Value Ref Range   pH, Arterial 7.204 (L) 7.350 - 7.450   pCO2 arterial 52.1 (H) 32.0 - 48.0 mmHg   pO2, Arterial 212.0 (H) 83.0 - 108.0 mmHg   Bicarbonate 21.4 20.0 - 28.0 mmol/L   TCO2 23 22 - 32 mmol/L   O2 Saturation 100.0 %   Acid-base deficit 8.0 (H) 0.0 - 2.0 mmol/L   Patient temperature 34.0 C    Collection site ARTERIAL LINE    Sample type ARTERIAL    Comment NOTIFIED PHYSICIAN   Glucose, capillary     Status: Abnormal   Collection Time: 05/24/18  5:34 AM  Result Value Ref Range   Glucose-Capillary 141 (H) 70 - 99 mg/dL   Comment 1 Notify RN   Basic metabolic panel     Status: Abnormal   Collection Time: 05/24/18  6:20 AM  Result Value Ref Range   Sodium 144 135 - 145 mmol/L   Potassium 4.1 3.5 - 5.1 mmol/L   Chloride 102 98 - 111 mmol/L   CO2 20 (L) 22 - 32 mmol/L   Glucose, Bld 152 (H) 70 - 99 mg/dL   BUN 28 (H) 6 - 20 mg/dL   Creatinine, Ser 2.16 (H) 0.61 - 1.24 mg/dL   Calcium 6.7 (L) 8.9 - 10.3 mg/dL   GFR calc non Af Amer 34 (L) >60 mL/min   GFR calc Af Amer 39 (L) >60 mL/min    Comment: (NOTE) The eGFR has been calculated using the CKD EPI equation. This calculation has not been validated in all clinical situations. eGFR's persistently <60 mL/min signify possible Chronic Kidney Disease.    Anion gap 22 (H) 5 - 15    Comment: Performed at [facility], [place] [address] [place], [place] [address]  CBC with Differential/Platelet     Status: Abnormal   Collection Time: 05/24/18  6:20 AM  Result Value Ref Range   WBC 9.3 4.0 - 10.5 K/uL   RBC 4.27 4.22 - 5.81 MIL/uL   Hemoglobin 11.6 (L) 13.0 - 17.0 g/dL   HCT 37.8 (L) 39.0 - 52.0 %   MCV 88.5 78.0 - 100.0 fL   MCH 27.2 26.0 - 34.0 pg   MCHC 30.7 30.0 - 36.0 g/dL   RDW 19.1 (H) 11.5 - 15.5 %   Platelets 148 (L) 150 - 400 K/uL   Neutrophils Relative % 96 %   Lymphocytes Relative 2 %   Monocytes Relative 2 %   Eosinophils Relative 0 %   Basophils Relative 0 %   Neutro Abs 8.9 (H) 1.7 - 7.7 K/uL   Lymphs Abs 0.2 (L) 0.7 - 4.0 K/uL   Monocytes Absolute 0.2 0.1 - 1.0 K/uL   Eosinophils Absolute 0.0 0.0 - 0.7 K/uL   Basophils Absolute 0.0 0.0 - 0.1 K/uL   RBC Morphology BURR CELLS    WBC Morphology INCREASED BANDS (>20% BANDS)     Comment: Performed at [facility], [place]  [address] [place], [place] [address]  Magnesium     Status: None   Collection Time: 05/24/18  6:20 AM  Result Value Ref Range   Magnesium 2.4 1.7 - 2.4 mg/dL    Comment: Performed at [facility] [address], [place], [place] [address]  Phosphorus     Status: Abnormal   Collection Time: 05/24/18  6:20 AM  Result Value Ref Range   Phosphorus 9.6 (H) 2.5 - 4.6 mg/dL    Comment: Performed at [facility] [address] [place], [place] [address]  Glucose, capillary     Status: Abnormal   Collection Time: 05/24/18  7:28 AM  Result Value Ref Range   Glucose-Capillary 60 (L) 70 - 99 mg/dL   Comment 1 Notify RN   Glucose, capillary     Status: Abnormal   Collection Time: 05/24/18  7:54 AM  Result Value Ref Range   Glucose-Capillary 130 (H) 70 - 99 mg/dL   Comment 1 Arterial Specimen   Vancomycin, random     Status: None   Collection Time: 05/24/18 10:09 AM  Result Value Ref Range   Vancomycin Rm 19     Comment:        Random Vancomycin therapeutic range is dependent on dosage and time of  specimen collection. A peak range is 20.0-40.0 ug/mL A trough range is 5.0-15.0 ug/mL        Performed at [facility] [address] [place], [place] [address]   Protime-INR     Status: Abnormal   Collection Time: 05/24/18 10:10 AM  Result Value Ref Range   Prothrombin Time 37.7 (H) 11.4 - 15.2 seconds   INR 3.87     Comment: Performed at [facility] [address] [place], [place] [address]   ROS:  Review of systems not obtained due to patient factors.  Physical Exam: Vitals:   05/24/18 1103 05/24/18 1126  BP:  125/62  Pulse:  97  Resp:  (!) 30  Temp: (S) 98.6 F (37 C)   SpO2:  100%     Physical Exam  Constitutional: He is intubated.  Eyes: Right eye exhibits no discharge. Left eye exhibits no discharge. No scleral icterus.  Neck: No tracheal deviation present. No thyromegaly present.  Cardiovascular: Normal rate and regular rhythm.  No murmur heard. Pulmonary/Chest: He is intubated. He has rhonchi.  Abdominal: Soft. He exhibits no mass. No hernia.  Musculoskeletal: He exhibits no edema or deformity.  Neurological: He is unresponsive.  ROS -1  Skin: He is not diaphoretic.  Vitals reviewed.  Assessment/Plan: 1. Renal- CR slightly increased from what appears to be a normal or near normal baseline most likely 2/2 ATN from his ischemic event and possibly contrast. Output decreasing over the past several days with associated increasing Scr and persistent lactic acidosis not responding to sodium bicarbonate infusion with progressively worsening renal output. Agree that CRRT is of benefit and will potentially decrease the rate of toxic buildup and permit volume balancing while treating with bicarb for his acidosis but does not necessarily improve the chances of recovery. This was relayed to the family. The patients wife, sisters, and brother are in agreement with proceeding with CRRT despite the uncertainty of improvement in neurologic function or overall  status. Given the patients ABG with of 7.2/52/212/20 and BMP findings as well as his current overall status on the ventilator, I feel that this is not emergent but should be initiated today while  awaiting for hopeful neurological recovery.  --Recommend CRRT today --Decreased sodium bicarb to 187m/hr --ABG At 1300 hours.  --agree with vasopressors to maintain MAP >65 during dialysis  2. Hypertension/volume  - Vasopressors as per PCCM. Will need to closely monitor his BP during CRRT to prevent hypotension.  3. ACS - As per cardiology.  4. Anemia  - Hgb ~11.5. Stable. Continue to monitor. No acute signs of hemorrhage, hemolysis or history of chronic hematopoetic disease.  5. Anoxic encephalopathy - CT and EEG without notable abnormality. Uncertain if neurologic function will improve. Agree with allowing patient time to recover   [facility]/2019, 11:45 AM   I saw the patient and personally evaluated him.  Discussed the case with the intensivist and family at the bedside.  Concern for anoxic brain injury but they would like to observe for 48h to see if meaningful recovery is possible.  His acidosis is able to be managed with IV bicarbonate gtt for now, but in the setting of severe oliguric AKI presumably secondary to ATN I think it's very reasonable to initiate CRRT as a supportive measure to prevent further metabolic or volume abnormalities from developing given low reserve for addition derangements.  We will follow closely with you.  D/C bicarb gtt once CRRT initiated.  LJannifer HickMD.

## 2018-05-24 NOTE — Progress Notes (Signed)
Progress Note  Patient Name: Jacobus Colvin Date of Encounter: 05/24/2018  Primary Cardiologist: new; Dr. Gwenlyn Found  Subjective   Intubated on vent; Pressors increased last night with hypotension;  Currently being warmed  T 36.1 now  Inpatient Medications    Scheduled Meds: . artificial tears  1 application Both Eyes P8E  . aspirin  81 mg Oral Once  . atorvastatin  80 mg Oral q1800  . chlorhexidine gluconate (MEDLINE KIT)  15 mL Mouth Rinse BID  . Chlorhexidine Gluconate Cloth  6 each Topical Daily  . hydrocortisone sod succinate (SOLU-CORTEF) inj  50 mg Intravenous Q6H  . insulin aspart  0-9 Units Subcutaneous Q4H  . mouth rinse  15 mL Mouth Rinse 10 times per day  . pantoprazole sodium  40 mg Per Tube Daily  . sodium chloride flush  10-40 mL Intracatheter Q12H  . sodium chloride flush  3 mL Intravenous Q12H  . ticagrelor  90 mg Per Tube BID   Continuous Infusions: . sodium chloride Stopped (05/23/18 2154)  . sodium chloride 10 mL/hr at 05/24/18 0600  . sodium chloride Stopped (05/23/18 2049)  . sodium chloride    . epinephrine 20 mcg/min (05/24/18 0800)  . lactated ringers Stopped (05/23/18 2055)  . norepinephrine (LEVOPHED) Adult infusion 30 mcg/min (05/24/18 0800)  . piperacillin-tazobactam (ZOSYN)  IV 12.5 mL/hr at 05/24/18 0800  .  sodium bicarbonate (isotonic) infusion in sterile water 200 mL/hr at 05/24/18 0800  . vancomycin Stopped (05/24/18 0007)  . vasopressin (PITRESSIN) infusion - *FOR SHOCK* Stopped (05/24/18 0519)   PRN Meds: sodium chloride, sodium chloride, Place/Maintain arterial line **AND** sodium chloride, acetaminophen (TYLENOL) oral liquid 160 mg/5 mL **OR** acetaminophen, docusate, nitroGLYCERIN, ondansetron (ZOFRAN) IV, sodium chloride flush, sodium chloride flush   Vital Signs    Vitals:   05/24/18 0645 05/24/18 0700 05/24/18 0754 05/24/18 0800  BP:   118/63 (!) 116/44  Pulse: 87 87 88 88  Resp: (!) 30 (!) 28 (!) 30 (!) 27  Temp:  (!) 96.8 F  (36 C)  (!) 97.2 F (36.2 C)  TempSrc:  Core  Core  SpO2: 100% 100% 100% 100%  Weight:      Height:        Intake/Output Summary (Last 24 hours) at 05/24/2018 0839 Last data filed at 05/24/2018 0800 Gross per 24 hour  Intake 8371.1 ml  Output 768 ml  Net 7603.1 ml    I/O since admission: +8513  Filed Weights   05/23/18 0500 05/24/18 0043 05/24/18 0500  Weight: 161 lb 9.6 oz (73.3 kg) 174 lb 13.2 oz (79.3 kg) 177 lb 14.6 oz (80.7 kg)    Telemetry    Sinus at 90 - Personally Reviewed  ECG    ECG (independently read by me): NSR at 74; QS V1-2; QTc 441 msec  Physical Exam   BP (!) 116/44 (BP Location: Left Arm)   Pulse 88   Temp (!) 97.2 F (36.2 C) (Core)   Resp (!) 27   Ht 5' 10" (1.778 m)   Wt 177 lb 14.6 oz (80.7 kg)   SpO2 100%   BMI 25.53 kg/m  General: Intubated, on vent  Skin: normal turgor, no rashes, warm and dry HEENT: Normocephalic, atraumatic. Pupils equal round and non reactive to light; sclera anicteric; Nose without nasal septal hypertrophy Neck: No JVD, no carotid bruits; normal carotid upstroke Lungs: clear to ausculatation and percussion; no wheezing or rales Chest wall: without tenderness to palpitation Heart: PMI not displaced, RRR, s1  s2 normal, 1/6 systolic murmur, no diastolic murmur, no rubs, gallops, thrills, or heaves Abdomen: soft, nontender; no hepatosplenomehaly, BS+; abdominal aorta nontender and not dilated by palpation. Back: no CVA tenderness Pulses 2+ R groin site stable Musculoskeletal:  no joint deformities Extremities: no clubbing cyanosis or edema, Homan's sign negative  Neurologic: grossly nonfocal; Cranial nerves grossly wnl   Labs    Chemistry Recent Labs  Lab 05/23/18 2253 05/24/18 0412 05/24/18 0620  NA 145 143 144  K 3.1* 4.5 4.1  CL 108 103 102  CO2 21* 20* 20*  GLUCOSE 154* 182* 152*  BUN 27* 28* 28*  CREATININE 1.81* 2.01* 2.16*  CALCIUM 6.5* 6.7* 6.7*  GFRNONAA 42* 37* 34*  GFRAA 48* 43* 39*    ANIONGAP 16* 20* 22*     Hematology Recent Labs  Lab 05/23/18 0439 05/23/18 2121 05/23/18 2253 05/24/18 0620  WBC 5.7  --  6.4 9.3  RBC 5.01  --  4.10* 4.27  HGB 13.4 11.6* 11.2* 11.6*  HCT 42.1 34.0* 36.0* 37.8*  MCV 84.0  --  87.8 88.5  MCH 26.7  --  27.3 27.2  MCHC 31.8  --  31.1 30.7  RDW 18.6*  --  18.9* 19.1*  PLT 272  --  142* 148*    Cardiac Enzymes Recent Labs  Lab 05/23/18 0439 05/23/18 0822 05/23/18 1206 05/23/18 2253  TROPONINI 2.05* 1.69* 1.74* 1.99*    Recent Labs  Lab 05/22/18 1736  TROPIPOC 0.11*     BNP Recent Labs  Lab 05/22/18 2027  BNP 276.5*     DDimer No results for input(s): DDIMER in the last 168 hours.   Lipid Panel     Component Value Date/Time   CHOL 125 05/22/2018 1745   TRIG 59 05/22/2018 2026   HDL 40 (L) 05/22/2018 1745   CHOLHDL 3.1 05/22/2018 1745   VLDL 11 05/22/2018 1745   LDLCALC 74 05/22/2018 1745    Radiology    Ct Head Wo Contrast  Result Date: 05/22/2018 CLINICAL DATA:  Status post CPR EXAM: CT HEAD WITHOUT CONTRAST CT CERVICAL SPINE WITHOUT CONTRAST TECHNIQUE: Multidetector CT imaging of the head and cervical spine was performed following the standard protocol without intravenous contrast. Multiplanar CT image reconstructions of the cervical spine were also generated. COMPARISON:  None. FINDINGS: CT HEAD FINDINGS Brain: No acute intracranial abnormality. Specifically, no hemorrhage, hydrocephalus, mass lesion, acute infarction, or significant intracranial injury. Vascular: No hyperdense vessel or unexpected calcification. Skull: No acute calvarial abnormality. Sinuses/Orbits: Mucosal thickening throughout the paranasal sinuses. Opacified scattered ethmoid air cells. Other: None CT CERVICAL SPINE FINDINGS Alignment: Normal Skull base and vertebrae: No fracture Soft tissues and spinal canal: Prevertebral soft tissues are normal. No epidural or paraspinal hematoma. Disc levels: Slight disc space narrowing at C5-6 and  C6-7. Mild degenerative facet disease bilaterally. Upper chest: Mild emphysema in the apices. Other: No acute findings IMPRESSION: No acute intracranial abnormality. No acute bony abnormality in the cervical spine. Electronically Signed   By: Rolm Baptise M.D.   On: 05/22/2018 18:22   Ct Cervical Spine Wo Contrast  Result Date: 05/22/2018 CLINICAL DATA:  Status post CPR EXAM: CT HEAD WITHOUT CONTRAST CT CERVICAL SPINE WITHOUT CONTRAST TECHNIQUE: Multidetector CT imaging of the head and cervical spine was performed following the standard protocol without intravenous contrast. Multiplanar CT image reconstructions of the cervical spine were also generated. COMPARISON:  None. FINDINGS: CT HEAD FINDINGS Brain: No acute intracranial abnormality. Specifically, no hemorrhage, hydrocephalus, mass lesion, acute infarction,  or significant intracranial injury. Vascular: No hyperdense vessel or unexpected calcification. Skull: No acute calvarial abnormality. Sinuses/Orbits: Mucosal thickening throughout the paranasal sinuses. Opacified scattered ethmoid air cells. Other: None CT CERVICAL SPINE FINDINGS Alignment: Normal Skull base and vertebrae: No fracture Soft tissues and spinal canal: Prevertebral soft tissues are normal. No epidural or paraspinal hematoma. Disc levels: Slight disc space narrowing at C5-6 and C6-7. Mild degenerative facet disease bilaterally. Upper chest: Mild emphysema in the apices. Other: No acute findings IMPRESSION: No acute intracranial abnormality. No acute bony abnormality in the cervical spine. Electronically Signed   By: Rolm Baptise M.D.   On: 05/22/2018 18:22   Dg Chest Port 1 View  Result Date: 05/23/2018 CLINICAL DATA:  Respiratory failure EXAM: PORTABLE CHEST 1 VIEW COMPARISON:  Chest radiograph from one day prior. FINDINGS: Endotracheal tube tip is 5.1 cm above the carina. Left internal jugular central venous catheter loops in the upper mediastinum with the tip oriented superiorly in  the high right mediastinum, unchanged. Intact sternotomy wires. Enteric tube enters lower thoracic esophagus with the tip not definitely seen on this image. Stable cardiomediastinal silhouette with normal heart size. No pneumothorax. No pleural effusion. Stable patchy right lung base and left lower parahilar consolidation. IMPRESSION: 1. Stable malpositioned left internal jugular central venous catheter with the tip oriented superiorly in the high right mediastinum. Otherwise well-positioned support structures. 2. Stable patchy right lung base and left lower parahilar consolidation. Electronically Signed   By: Ilona Sorrel M.D.   On: 05/23/2018 07:39   Dg Chest Port 1 View  Result Date: 05/22/2018 CLINICAL DATA:  Central line placement. EXAM: PORTABLE CHEST 1 VIEW COMPARISON:  May 22, 2018 FINDINGS: An ETT terminates in good position in the mid trachea. A new left central line courses to the right of midline with the tip directed superiorly. Recommend repositioning. The NG tube terminates below today's film. No pneumothorax. Mild left perihilar opacity. More focal opacity in the right medial lung base most suggestive of infiltrate. The heart, hila, and mediastinum are unchanged. IMPRESSION: 1. The new left central line terminates to the right of midline with the tip directed superiorly, possibly in the right brachiocephalic vein. Recommend repositioning. 2. Other support apparatus as above. 3. Focal infiltrate in the medial right lung base favored to represent pneumonia or aspiration. Recommend follow-up to resolution. 4. Left perihilar opacity is nonspecific but may represent infiltrate as well. These results will be called to the ordering clinician or representative by the Radiologist Assistant, and communication documented in the PACS or zVision Dashboard. Electronically Signed   By: Dorise Bullion III M.D   On: 05/22/2018 21:55   Dg Chest Port 1 View  Result Date: 05/22/2018 CLINICAL DATA:   Endotracheal tube placement EXAM: PORTABLE CHEST 1 VIEW COMPARISON:  None FINDINGS: Endotracheal tube tip is at the level of the clavicular heads. The orogastric tube tip and side port project over the stomach. There are moderate bilateral parahilar opacities. Lungs are otherwise clear. IMPRESSION: 1. Radiographically appropriate positioning of endotracheal and orogastric tubes. 2. Bilateral parahilar opacities may indicate pulmonary edema. Electronically Signed   By: Ulyses Jarred M.D.   On: 05/22/2018 20:35    Cardiac Studies    History obtained from chart review.  52 year old married Caucasian male with a history of prior thymectomy for myasthenia gravis as well as cocaine abuse who apparently was found down this afternoon in front of his mailbox and had bystander CPR.  EMS was called.  He was shocked 4  times and received intravenous epinephrine.  He had return of spontaneous circulation in sinus rhythm and was brought emergently to [facility].  His initial EKG showed ST segment elevation in leads the 3 4 and 5 but ultimately this improved somewhat.  His blood pressure remained stable.  He was intubated and sedated in the ER.  Head CT showed no bleed.  He is brought urgently to the Cath Lab for angiography potential intervention.       Post-Intervention Diagram            Patient Profile     52 y.o. male with history of myasthenia gravis post thymus resection, MI several years ago, hypertension who presented to the hospital with cardiac arrest, found to have anterior ST elevation and is now status post LAD stent.   Assessment & Plan    1. Out of hospital cardiac arrest: Day 2 2/2 LAD occlusion with Anterior STEMI; s/p DES stent to LAD. According to separated wife, pt had been c/o chest pain recently. Trop 2.15.  Drug screen + for cocaine. S/p hypothermia protocol, currently being warmed. For echo today. On ASA/Brilinta.  No recurrent VT/VF, now off amiodarone  2.  Cardiogenic shock; now on   Epi and levophed; BP 136/74  3. Anoxic encepholopathy; Initial EEG abnormal; to repeat once normothermic.  4. Respiratory arrest; on vent; no longer with sedation, per CCM with possible aspiration pneumonia  5. Acute kidney injury: Cr increased to 2.16 contributed by shock, hypotension and contrast  6. Hypokalemia;  Improved today to 4.1  7. Substance abuse;  + cocaine  Time spent: 40 minutes  Signed, Troy Sine, MD, [facility] 05/24/2018, 8:39 AM

## 2018-05-24 NOTE — Progress Notes (Signed)
Patient bladder scanned at 0300. Scan showed 362 cc present in bladder. Bladder was irrigated with 10 cc and no flow returned. Elink called and placed order to remove current catheter and place new catheter. RN will place new indwelling catheter.

## 2018-05-24 NOTE — Progress Notes (Signed)
CRITICAL VALUE ALERT  Critical Value:  Calcium 6.7  Date & Time Notied: 05/24/2018 @ 13:37  Provider Notified: Dr. Valeta Harms  Orders Received/Actions taken: ordered ionized calcium

## 2018-05-24 NOTE — Progress Notes (Addendum)
PULMONARY / CRITICAL CARE MEDICINE  Name: Howard Thompson MRN: [id number] DOB: 08/25/1966    ADMISSION DATE:  05/22/2018 CONSULTATION DATE:  05/22/2018  REFERRING MD: Dr. Quay Burow, MD      CHIEF COMPLAINT:  STEMI, Shock   HISTORY OF PRESENT ILLNESS:   Howard Thompson, 52 y.o. male admitted on 05/22/2018 s/p unwitnessed out of hospital cardiac arrest, VF, unknown downtime, past medical history of coronary artery disease, myasthenia gravis, hypertension.  Per the electronic medical record the patient received 5 defibrillations and given 300 mg amiodarone.  Upon arrival the patient was found to have a STEMI and was taken to the Cath Lab where a DES was placed and a 100% LAD lesion.  Post catheter patient was taken for therapeutic hypothermia to the intensive care unit.  Per the initial critical care documentation there was concern for posturing.  EKGs initially with a prolonged QT at 560, now improved.  He was initially placed in a c-collar, presenting head CT and CT cervical spine negative for acute abnormalities.   SUBJECTIVE:  Yesterday evening there was attempt to continue with hypothermia as well as initiation of dobutamine.  Patient became progressively hypotensive requiring pushes of epinephrine.  Patient remains a no chest compression  CODE STATUS.  VITAL SIGNS: BP (!) 116/44 (BP Location: Left Arm)   Pulse 88   Temp (!) 97.2 F (36.2 C) (Core)   Resp (!) 27   Ht 5\' 10"  (1.778 m)   Wt 177 lb 14.6 oz (80.7 kg)   SpO2 100%   BMI 25.53 kg/m   HEMODYNAMICS: CVP:  [8 mmHg-20 mmHg] 16 mmHg  VENTILATOR SETTINGS: Vent Mode: PRVC FiO2 (%):  [50 %-100 %] 100 % Set Rate:  [26 bmp-30 bmp] 30 bmp Vt Set:  [580 mL] 580 mL PEEP:  [12 cmH20] 12 cmH20 Plateau Pressure:  [20 cmH20-29 cmH20] 20 cmH20  INTAKE / OUTPUT: I/O last 3 completed shifts: In: 9956.9 [I.V.:7020.4; NG/GT:200; IV Piggyback:2736.5] Out: 2013 [Urine:1163; Emesis/NG output:850]  PHYSICAL EXAMINATION: General: Elderly  male, intubated, unresponsive Neuro: Unresponsive, comatose, no response to painful stimuli HEENT: NCAT, c-collar in place, 1 mm bilateral pinpoint pupils, nonreactive to light, reddish-brown drainage from the right nare Cardiovascular: Regular rate and rhythm, S1-S2, no overt murmur heard over ventilated breath sounds Lungs: Bilateral ventilated breath sounds, obscured secondary to placement of Arctic sun cooling pads, diminished in the right base Abdomen: Slightly tense, nondistended, no grimace to palpation, quiet bowel sounds however present Musculoskeletal: Arctic sun cooling pads on lower extremities bilaterally, right groin bandaged status post LHC Skin: Cool, clammy to touch  LABS:  BMET Recent Labs  Lab 05/23/18 2253 05/24/18 0412 05/24/18 0620  NA 145 143 144  K 3.1* 4.5 4.1  CL 108 103 102  CO2 21* 20* 20*  BUN 27* 28* 28*  CREATININE 1.81* 2.01* 2.16*  GLUCOSE 154* 182* 152*    Electrolytes Recent Labs  Lab 05/22/18 2027 05/23/18 0439  05/23/18 2253 05/24/18 0412 05/24/18 0620  CALCIUM 8.3* 8.0*   < > 6.5* 6.7* 6.7*  MG 2.0 2.0  --  1.6*  --  2.4  PHOS 4.1 2.6  --   --   --  9.6*   < > = values in this interval not displayed.    CBC Recent Labs  Lab 05/23/18 0439 05/23/18 2121 05/23/18 2253 05/24/18 0620  WBC 5.7  --  6.4 9.3  HGB 13.4 11.6* 11.2* 11.6*  HCT 42.1 34.0* 36.0* 37.8*  PLT 272  --  142* 148*    Coag's Recent Labs  Lab 05/22/18 1744 05/22/18 2027 05/23/18 0439  APTT 30 122* 59*  INR 1.07 5.53* 1.55    Sepsis Markers Recent Labs  Lab 05/22/18 2027 05/23/18 0045 05/23/18 2253 05/24/18 0412  LATICACIDVEN 2.8* 2.3* 10.2* 11.8*  PROCALCITON 0.14  --   --   --     ABG Recent Labs  Lab 05/23/18 2035 05/23/18 2206 05/24/18 0503  PHART 7.232* 7.315* 7.204*  PCO2ART 41.3 42.9 52.1*  PO2ART 29.0* 108.0 212.0*    Liver Enzymes No results for input(s): AST, ALT, ALKPHOS, BILITOT, ALBUMIN in the last 168 hours.  Cardiac  Enzymes Recent Labs  Lab 05/23/18 0822 05/23/18 1206 05/23/18 2253  TROPONINI 1.69* 1.74* 1.99*    Glucose Recent Labs  Lab 05/23/18 1610 05/23/18 1815 05/23/18 2018 05/23/18 2327 05/24/18 0416 05/24/18 0534  GLUCAP 122* 93 74 136* 177* 141*    Imaging Chest xray imaging reviewed by me. Bilateral hilar opacities, displaced left IJ CVC  STUDIES:  LHC 05/22/2018 by Dr. Harriet Butte LAD to Prox LAD lesion is 100% stenosed.  A drug-eluting stent was successfully placed using a STENT SYNERGY DES 3X20.  Post intervention, there is a 0% residual stenosis.  There is severe left ventricular systolic dysfunction.  LV end diastolic pressure is mildly elevated.  The left ventricular ejection fraction is less than 25% by visual estimate.  ECHO 05/23/2018: EF 28-36%, Grade 2 Diastolic dysfunction  Regional wall motion abnormalities   EKG 05/23/2018: OTc 560 >> 441 >> 441  CULTURES: 05/22/2018 Tracheal Aspirate  -  Few GPCs and GNRs - PENDING   ANTIBIOTICS: Pip/tazo: Full Day 1 05/23/2018: Vancomycin: Full Day 1 05/23/2018  SIGNIFICANT EVENTS: 7/28 - Stopped external cooling  LINES/TUBES: Left IJ CVC   ASSESSMENT / PLAN:  1. Acute Hypoxemic Respiratory Failure s/p Cardiac Arrest, s/p 5 defibrillations, 300mg  Amiodarone with ROSC, CXR with bilateral hilar and basilar opacities, requiring mechanical ventilation, on 100% FiO2 and 12 of PEEP   - Probable aspiration pneumonitis, aspiration pneumonia, tracheal aspirate with gram positive cocci and gram negative rods   - would continue renally dosed vancomycin per pharmacy   - continue pip/tazo until cx finalizes   - Wean FiO2 to maintain normal PaO2   - Recommend leaving PEEP at 12 for the time being  2. Cardiogenic Shock, requiring high dose vasopressors, s/p STEMI with 100% LAD Occlusion with placement of DES, Acute Systolic and Diastolic Heart failure   - Continue ASA, 80mg  atorvastatin, antiplatelets per cardiology   -  Wean vasopressors to maintain MAP >68mmHg  - Would wean NEpi first, continue the vasopressin, EPI   - Patient was started on stress dose steroids can continue this for now   3. Refractory lactic acidosis, on bicarbonate infusion, oliguria, AKI, elevated Scr   - I have consulted nephrology for recommendations regarding candidacy for CRRT, I spoke with Dr. Johnney Ou  4. Likely ICU induced and pressor induced gastritis, blood OGT suctioning  - Change PPI to H2 blocker in the setting of oliguria and AKI  5. Prior elevated INR  - We will recheck his INR today  6. Prior prolonged QTc, now improved on most recent EKGs   - stopped prn zofran due to this history  7. Unknown Neurologic status at this time, currently being rewarmed from cooling protocol  - Maintain RASS 0 to -1  - added PRN Fentanyl  8. Drainage from the right nare, he was cocaine positive on  admission, unsure of his preferred method of use at this time, unsure if there was a prior NGT attempt.   - We will continue to observe  9. Stress induced hyperglycemia, steroid induced  - continue SSI  10. Displacement left IJ CVC, appearing to be turning upwards toward the right subclavian, this needs to be repositioned however currently does draw blood appropriately per nursing staff.   - hold off on repositioning at this time due to the need for multiple vasopressors and life saving drug administrations  - pending discussion with nephrology he will need a dialysis catheter   FAMILY  - Updates: I spoke with patients wife and sister-in-law  The patient is critically ill with multiple organ systems failure and requires high complexity decision making for assessment and support, frequent evaluation and titration of therapies, application of advanced monitoring technologies and extensive interpretation of multiple databases. Critical Care Time devoted to patient care services described in this note is 40 minutes.  Garner Nash, DO  Pulmonary and  [place] Pager: [phone]  05/24/2018, 8:40 AM

## 2018-05-24 NOTE — Progress Notes (Signed)
CRITICAL VALUE ALERT  Critical Value:  Lactate 10.2  Date & Time Notied: 05/24/18  2348  Provider Notified: *MD Vernie Murders  See new orders

## 2018-05-24 NOTE — Progress Notes (Signed)
PCCM INTERVAL PROGRESS NOTE   ABG reviewed  ABG    Component Value Date/Time   PHART 7.482 (H) 05/24/2018 1804   PCO2ART 42.2 05/24/2018 1804   PO2ART 199 (H) 05/24/2018 1804   HCO3 31.2 (H) 05/24/2018 1804   TCO2 23 05/24/2018 0503   ACIDBASEDEF 8.0 (H) 05/24/2018 0503   O2SAT 99.5 05/24/2018 1804    Plan: Maintain vent settings DC bicarb infusion CRRT initiation pending  Georgann Housekeeper, AGACNP-BC [facility] Pulmonology/Critical Care Pager [phone] or [phone]  05/24/2018 6:20 PM

## 2018-05-24 NOTE — Progress Notes (Signed)
New 16Fr temp foley placed with urine return. Temperature probe reconnected to [employer]. No complications. Proper sterile procedure followed with RN Dorene Grebe to assist.

## 2018-05-24 NOTE — Progress Notes (Signed)
CRITICAL VALUE ALERT  Critical Value: Lactate >30  Date & Time Notied: 7/29 3073  Provider Notified: MD Oletta Darter  See new orders

## 2018-05-24 NOTE — Progress Notes (Signed)
Initial Nutrition Assessment  DOCUMENTATION CODES:   Not applicable  INTERVENTION:   Vital AF 1.2 @ 50 ml/hr (1200 ml) via OGT  30 ml Prostat BID  Provides: 1640 kcals, 120 grams protein, 972 ml free water.  Provides 1113 mg of Phosphorus  NUTRITION DIAGNOSIS:   Increased nutrient needs related to acute illness(AKI requiring CRRT) as evidenced by estimated needs  GOAL:   Patient will meet greater than or equal to 90% of their needs  MONITOR:   Weight trends, Labs, Diet advancement, Vent status, TF tolerance, I & O's  REASON FOR ASSESSMENT:   Ventilator    ASSESSMENT:   Patient with PMH significant for CAD with prior MI, HTN, cocaine abuse, and myasthenia gravis s/p thymectomy. Presents this admission with after being found next to his mailbox unresponsive, by stander CPR was initiated. Admitted for cardiac arrest presumed in the setting of ST elevation MI and 100% LAD lesion.    7/27- DES placed   Pt requiring pressor support x3. External cooling d/c.  Nephrology recommending CRRT today.  Discussed with CCM. Will potentially begin feeding tomorrow.   Spoke with wife at bedside. They are currently going through a separation and she is not fully aware of his recent eating habits. She has noticed he has lost an excessive amount of weight.  Endorses a UBW of 195-200 lb which she estimates he was at about one year ago. Nutrition-Focused physical exam completed. Fluid accumulation may be masking losses. Suspect pt has some form of malnutrition but unable to diagnose at this time. Will attempt to re-do NFPE once pt begins diuresing.    Patient is currently intubated on ventilator support MV: 18.1 L/min Temp (24hrs), Avg:93.8 F (34.3 C), Min:90 F (32.2 C), Max:99.5 F (37.5 C)  I/O: + 9685 ml since admit UOP: 403 ml x 24 hrs OGT: 400 ml x 24 hrs (bloody)  Medications reviewed and include: solucortef, sodium bicarbonate 150 mEq Labs reviewed: Phosphorus 9.6 (H)    NUTRITION - FOCUSED PHYSICAL EXAM:    Most Recent Value  Orbital Region  No depletion  Upper Arm Region  No depletion  Thoracic and Lumbar Region  Unable to assess  Buccal Region  Unable to assess  Temple Region  Mild depletion  Clavicle Bone Region  No depletion  Clavicle and Acromion Bone Region  No depletion  Scapular Bone Region  Unable to assess  Dorsal Hand  No depletion  Patellar Region  Mild depletion  Anterior Thigh Region  Mild depletion  Posterior Calf Region  Mild depletion  Edema (RD Assessment)  Moderate  Hair  Reviewed  Eyes  Unable to assess  Mouth  Unable to assess  Skin  Reviewed  Nails  Reviewed     Diet Order:   Diet Order           Diet NPO time specified  Diet effective now          EDUCATION NEEDS:   Not appropriate for education at this time  Skin:  Skin Assessment: Reviewed RN Assessment  Last BM:  05/24/18  Height:   Ht Readings from Last 1 Encounters:  05/22/18 5\' 10"  (1.778 m)    Weight:   Wt Readings from Last 1 Encounters:  05/24/18 177 lb 14.6 oz (80.7 kg)    Ideal Body Weight:  75.5 kg  BMI:  Body mass index is 25.53 kg/m.  Estimated Nutritional Needs:   Kcal:  1679 kcal  Protein:  102-122 grams (1.5-1.8 g/kg using  dry wt)  Fluid:  per MD    Mariana Single RD, LDN Clinical Nutrition Pager # [phone]

## 2018-05-25 DIAGNOSIS — I249 Acute ischemic heart disease, unspecified: Secondary | ICD-10-CM

## 2018-05-25 DIAGNOSIS — R579 Shock, unspecified: Secondary | ICD-10-CM

## 2018-05-25 DIAGNOSIS — Z9911 Dependence on respirator [ventilator] status: Secondary | ICD-10-CM

## 2018-05-25 DIAGNOSIS — R7989 Other specified abnormal findings of blood chemistry: Secondary | ICD-10-CM

## 2018-05-25 DIAGNOSIS — N179 Acute kidney failure, unspecified: Secondary | ICD-10-CM

## 2018-05-25 DIAGNOSIS — D689 Coagulation defect, unspecified: Secondary | ICD-10-CM

## 2018-05-25 DIAGNOSIS — K7200 Acute and subacute hepatic failure without coma: Secondary | ICD-10-CM

## 2018-05-25 DIAGNOSIS — R945 Abnormal results of liver function studies: Secondary | ICD-10-CM

## 2018-05-25 LAB — GLUCOSE, CAPILLARY
GLUCOSE-CAPILLARY: 142 mg/dL — AB (ref 70–99)
GLUCOSE-CAPILLARY: 139 mg/dL — AB (ref 70–99)
GLUCOSE-CAPILLARY: 110 mg/dL — AB (ref 70–99)
Glucose-Capillary: 76 mg/dL (ref 70–99)
Glucose-Capillary: 70 mg/dL (ref 70–99)
Glucose-Capillary: 56 mg/dL — ABNORMAL LOW (ref 70–99)
Glucose-Capillary: 98 mg/dL (ref 70–99)
Glucose-Capillary: 97 mg/dL (ref 70–99)
Glucose-Capillary: 66 mg/dL — ABNORMAL LOW (ref 70–99)
Glucose-Capillary: 107 mg/dL — ABNORMAL HIGH (ref 70–99)

## 2018-05-25 LAB — BASIC METABOLIC PANEL
ANION GAP: 15 (ref 5–15)
BUN: 39 mg/dL — ABNORMAL HIGH (ref 6–20)
CALCIUM: 6.5 mg/dL — AB (ref 8.9–10.3)
CO2: 31 mmol/L (ref 22–32)
CREATININE: 3.74 mg/dL — AB (ref 0.61–1.24)
Chloride: 93 mmol/L — ABNORMAL LOW (ref 98–111)
GFR, EST AFRICAN AMERICAN: 20 mL/min — AB (ref >60)
GFR, EST NON AFRICAN AMERICAN: 17 mL/min — AB (ref >60)
GLUCOSE: 60 mg/dL — AB (ref 70–99)
Potassium: 4 mmol/L (ref 3.5–5.1)
Sodium: 139 mmol/L (ref 135–145)

## 2018-05-25 LAB — CBC WITH DIFFERENTIAL/PLATELET
BASOS PCT: 1 %
Basophils Absolute: 0.2 10*3/uL — ABNORMAL HIGH (ref 0.0–0.1)
EOS PCT: 0 %
Eosinophils Absolute: 0 10*3/uL (ref 0.0–0.7)
HEMATOCRIT: 31.1 % — AB (ref 39.0–52.0)
Hemoglobin: 10.3 g/dL — ABNORMAL LOW (ref 13.0–17.0)
LYMPHS PCT: 2 %
Lymphs Abs: 0.3 10*3/uL — ABNORMAL LOW (ref 0.7–4.0)
MCH: 27.2 pg (ref 26.0–34.0)
MCHC: 33.1 g/dL (ref 30.0–36.0)
MCV: 82.3 fL (ref 78.0–100.0)
MONO ABS: 0.3 10*3/uL (ref 0.1–1.0)
MONOS PCT: 2 %
NEUTROS PCT: 95 %
Neutro Abs: 15.1 10*3/uL — ABNORMAL HIGH (ref 1.7–7.7)
PLATELETS: 123 10*3/uL — AB (ref 150–400)
RBC: 3.78 MIL/uL — AB (ref 4.22–5.81)
RDW: 18.9 % — ABNORMAL HIGH (ref 11.5–15.5)
WBC MORPHOLOGY: INCREASED
WBC: 15.9 10*3/uL — AB (ref 4.0–10.5)

## 2018-05-25 LAB — BPAM FFP
BLOOD PRODUCT EXPIRATION DATE: 201908032359
Blood Product Expiration Date: 201908032359
Blood Product Expiration Date: 201908032359
ISSUE DATE / TIME: 201907292236
ISSUE DATE / TIME: 201907292236
ISSUE DATE / TIME: 201907292236
UNIT TYPE AND RH: 5100
UNIT TYPE AND RH: 5100
Unit Type and Rh: 5100

## 2018-05-25 LAB — HEPATIC FUNCTION PANEL
ALK PHOS: 105 U/L (ref 38–126)
ALT: 6238 U/L — AB (ref 0–44)
AST: 9330 U/L — AB (ref 15–41)
Albumin: 2.5 g/dL — ABNORMAL LOW (ref 3.5–5.0)
BILIRUBIN TOTAL: 2.5 mg/dL — AB (ref 0.3–1.2)
Bilirubin, Direct: 1.1 mg/dL — ABNORMAL HIGH (ref 0.0–0.2)
Indirect Bilirubin: 1.4 mg/dL — ABNORMAL HIGH (ref 0.3–0.9)
Total Protein: 4.7 g/dL — ABNORMAL LOW (ref 6.5–8.1)

## 2018-05-25 LAB — BLOOD GAS, ARTERIAL
Acid-Base Excess: 7.8 mmol/L — ABNORMAL HIGH (ref 0.0–2.0)
Bicarbonate: 31.4 mmol/L — ABNORMAL HIGH (ref 20.0–28.0)
FIO2: 50
O2 SAT: 98.5 %
PEEP: 12 cmH2O
PH ART: 7.5 — AB (ref 7.350–7.450)
Patient temperature: 98.6
RATE: 24 resp/min
VT: 580 mL
pCO2 arterial: 40.6 mmHg (ref 32.0–48.0)
pO2, Arterial: 129 mmHg — ABNORMAL HIGH (ref 83.0–108.0)

## 2018-05-25 LAB — PREPARE FRESH FROZEN PLASMA

## 2018-05-25 LAB — POCT I-STAT 3, ART BLOOD GAS (G3+)
Acid-Base Excess: 14 mmol/L — ABNORMAL HIGH (ref 0.0–2.0)
Bicarbonate: 37.5 mmol/L — ABNORMAL HIGH (ref 20.0–28.0)
O2 Saturation: 100 %
PCO2 ART: 41.8 mmHg (ref 32.0–48.0)
TCO2: 39 mmol/L — AB (ref 22–32)
pH, Arterial: 7.56 — ABNORMAL HIGH (ref 7.350–7.450)
pO2, Arterial: 178 mmHg — ABNORMAL HIGH (ref 83.0–108.0)

## 2018-05-25 LAB — MAGNESIUM: Magnesium: 1.7 mg/dL (ref 1.7–2.4)

## 2018-05-25 LAB — LACTIC ACID, PLASMA: Lactic Acid, Venous: 3.2 mmol/L (ref 0.5–1.9)

## 2018-05-25 LAB — PROTIME-INR
INR: 2.25
PROTHROMBIN TIME: 24.7 s — AB (ref 11.4–15.2)

## 2018-05-25 LAB — PHOSPHORUS: Phosphorus: 4.3 mg/dL (ref 2.5–4.6)

## 2018-05-25 LAB — APTT: aPTT: 43 seconds — ABNORMAL HIGH (ref 24–36)

## 2018-05-25 LAB — CALCIUM, IONIZED: Calcium, Ionized, Serum: 3.4 mg/dL — ABNORMAL LOW (ref 4.5–5.6)

## 2018-05-25 MED ORDER — ZINC SULFATE 220 (50 ZN) MG PO CAPS
220.0000 mg | ORAL_CAPSULE | Freq: Every day | ORAL | Status: DC
  Administered 2018-05-25 – 2018-06-07 (×9): 220 mg via ORAL
  Filled 2018-05-25 (×14): qty 1

## 2018-05-25 MED ORDER — VITAL AF 1.2 CAL PO LIQD
1500.0000 mL | ORAL | Status: DC
  Administered 2018-05-25 – 2018-05-27 (×3): 1500 mL
  Filled 2018-05-25 (×7): qty 1500

## 2018-05-25 MED ORDER — PIPERACILLIN-TAZOBACTAM IN DEX 2-0.25 GM/50ML IV SOLN
2.2500 g | Freq: Four times a day (QID) | INTRAVENOUS | Status: DC
  Administered 2018-05-25 – 2018-05-26 (×5): 2.25 g via INTRAVENOUS
  Filled 2018-05-25 (×7): qty 50

## 2018-05-25 MED ORDER — VITAL AF 1.2 CAL PO LIQD
1500.0000 mL | ORAL | Status: DC
  Administered 2018-05-25: 1500 mL
  Filled 2018-05-25: qty 1500

## 2018-05-25 MED ORDER — SODIUM CHLORIDE 0.9% IV SOLUTION: Freq: Once | INTRAVENOUS | Status: DC

## 2018-05-25 MED ORDER — HEPARIN SODIUM (PORCINE) 5000 UNIT/ML IJ SOLN
5000.0000 [IU] | Freq: Three times a day (TID) | INTRAMUSCULAR | Status: DC
  Administered 2018-05-25 – 2018-05-31 (×18): 5000 [IU] via SUBCUTANEOUS
  Filled 2018-05-25 (×18): qty 1

## 2018-05-25 MED ORDER — DEXTROSE 10 % IV SOLN
INTRAVENOUS | Status: DC
  Administered 2018-05-25: 05:00:00 via INTRAVENOUS

## 2018-05-25 MED ORDER — DEXTROSE 50 % IV SOLN
1.0000 | Freq: Once | INTRAVENOUS | Status: AC
  Administered 2018-05-25: 50 mL via INTRAVENOUS
  Filled 2018-05-25: qty 50

## 2018-05-25 MED ORDER — FUROSEMIDE 10 MG/ML IJ SOLN
200.0000 mg | Freq: Once | INTRAVENOUS | Status: AC
  Administered 2018-05-25: 200 mg via INTRAVENOUS
  Filled 2018-05-25: qty 20

## 2018-05-25 MED ORDER — DEXMEDETOMIDINE HCL IN NACL 400 MCG/100ML IV SOLN
0.4000 ug/kg/h | INTRAVENOUS | Status: DC
  Administered 2018-05-25: 0.6 ug/kg/h via INTRAVENOUS
  Administered 2018-05-25: 0.4 ug/kg/h via INTRAVENOUS
  Administered 2018-05-25: 0.7 ug/kg/h via INTRAVENOUS
  Administered 2018-05-26 (×2): 0.8 ug/kg/h via INTRAVENOUS
  Administered 2018-05-26: 0.6 ug/kg/h via INTRAVENOUS
  Administered 2018-05-27: 1.2 ug/kg/h via INTRAVENOUS
  Administered 2018-05-27: 0.3 ug/kg/h via INTRAVENOUS
  Administered 2018-05-28 (×2): 0.4 ug/kg/h via INTRAVENOUS
  Filled 2018-05-25 (×10): qty 100

## 2018-05-25 MED ORDER — DEXTROSE 5 % IV SOLN
10.0000 mg | Freq: Once | INTRAVENOUS | Status: AC
  Administered 2018-05-25: 10 mg via INTRAVENOUS
  Filled 2018-05-25: qty 1

## 2018-05-25 MED ORDER — PIPERACILLIN-TAZOBACTAM 3.375 G IVPB
3.3750 g | Freq: Four times a day (QID) | INTRAVENOUS | Status: DC
Start: 2018-05-25 — End: 2018-05-25
  Filled 2018-05-25: qty 50

## 2018-05-25 MED ORDER — VITAL AF 1.2 CAL PO LIQD: 1000.0000 mL | ORAL | Status: DC

## 2018-05-25 MED ORDER — DEXTROSE 50 % IV SOLN
1.0000 | Freq: Once | INTRAVENOUS | Status: AC
  Administered 2018-05-25: 50 mL via INTRAVENOUS

## 2018-05-25 MED ORDER — PRO-STAT SUGAR FREE PO LIQD
30.0000 mL | Freq: Two times a day (BID) | ORAL | Status: DC
  Administered 2018-05-25: 30 mL
  Filled 2018-05-25: qty 30

## 2018-05-25 MED ORDER — MIDAZOLAM HCL 2 MG/2ML IJ SOLN
2.0000 mg | INTRAMUSCULAR | Status: DC | PRN
  Filled 2018-05-25 (×2): qty 2

## 2018-05-25 MED ORDER — MIDAZOLAM HCL 2 MG/2ML IJ SOLN
2.0000 mg | INTRAMUSCULAR | Status: DC | PRN
  Administered 2018-05-25 (×3): 2 mg via INTRAVENOUS
  Administered 2018-05-25: 1 mg via INTRAVENOUS
  Administered 2018-05-26: 2 mg via INTRAVENOUS
  Filled 2018-05-25 (×4): qty 2

## 2018-05-25 MED ORDER — LIDOCAINE HCL (PF) 1 % IJ SOLN
INTRAMUSCULAR | Status: AC
Start: 2018-05-25 — End: 2018-05-25
  Administered 2018-05-25: 16:00:00
  Filled 2018-05-25: qty 5

## 2018-05-25 MED ORDER — HYDROCORTISONE NA SUCCINATE PF 100 MG IJ SOLR
50.0000 mg | Freq: Every day | INTRAMUSCULAR | Status: DC
  Administered 2018-05-26 – 2018-05-31 (×6): 50 mg via INTRAVENOUS
  Filled 2018-05-25 (×6): qty 2

## 2018-05-25 NOTE — Progress Notes (Signed)
[facility] Progress Note Patient Name: Howard Thompson DOB: 09-20-1966 MRN: [id number]   Date of Service  05/25/2018  HPI/Events of Note  INR = 2.25 s/p FFP.  eICU Interventions  Will order: 1. Transfuse 2 units FFP.      Intervention Category Intermediate Interventions: Coagulopathy - evaluation and management  Sommer,Steven Eugene 05/25/2018, 5:31 AM

## 2018-05-25 NOTE — Procedures (Signed)
Hemodialysis Insertion Procedure Note Howard Thompson [id number] Apr 14, 1966  Procedure: Insertion of Hemodialysis Catheter Type: 3 port  Indications: Hemodialysis   Procedure Details Consent: Risks of procedure as well as the alternatives and risks of each were explained to the (patient/caregiver).  Consent for procedure obtained. Time Out: Verified patient identification, verified procedure, site/side was marked, verified correct patient position, special equipment/implants available, medications/allergies/relevent history reviewed, required imaging and test results available.  Performed  Maximum sterile technique was used including antiseptics, cap, gloves, gown, hand hygiene, mask and sheet. Skin prep: Chlorhexidine; local anesthetic administered A antimicrobial bonded/coated triple lumen catheter was placed in the right femoral vein due to limited access, no other available access using the Seldinger technique. Left femoral was attempted first. Ultrasound demonstrated clearly visible artery and a second vascular appearing structure that was not pulsatile and fully collapsible, but on puncture, blood was bright red and pulsatile in this secondary vessel. Photo below. Left was aborted in favor of the right.  Ultrasound guidance used.Yes.   Catheter placed to 20 cm. Blood aspirated via all 3 ports and then flushed x 3. Line sutured x 2 and dressing applied.  Evaluation Blood flow good Complications: No apparent complications Patient did tolerate procedure well. Chest X-ray ordered to verify placement.  CXR: N/a  Georgann Housekeeper, AGACNP-BC [facility] Pulmonology/Critical Care Pager [phone] or [phone]  05/25/2018 3:56 PM

## 2018-05-25 NOTE — Progress Notes (Signed)
[facility] Progress Note Patient Name: Howard Thompson DOB: 02-17-1966 MRN: [id number]   Date of Service  05/25/2018  HPI/Events of Note  Multiple issues: 1. ABG on 50%/PRVC 30/TV 580/P 12 = 7.56/41.8/178.0. NaHCO3 IV infusion  D/Ced at 6:20 PM yesterday and 2. Blood glucose = 56.   eICU Interventions  Will order: 1. Decrease PRVC rate to 24. 2. Repeat ABG at 7:15 AM. 3. D10W IV infusion to run at 50 mL/hour.      Intervention Category Major Interventions: Other:;Acid-Base disturbance - evaluation and management;Respiratory failure - evaluation and management  Thomas Rhude Eugene 05/25/2018, 5:00 AM

## 2018-05-25 NOTE — Progress Notes (Signed)
Inpatient Diabetes Program Recommendations  AACE/ADA: New Consensus Statement on Inpatient Glycemic Control (2015)  Target Ranges:  Prepandial:   less than 140 mg/dL      Peak postprandial:   less than 180 mg/dL (1-2 hours)      Critically ill patients:  140 - 180 mg/dL   Lab Results  Component Value Date   GLUCAP 97 05/25/2018   HGBA1C 5.7 (H) 05/22/2018    Review of Glycemic ControlResults for KORE, MADLOCK (MRN [id number]) as of 05/25/2018 11:04  Ref. Range 05/25/2018 03:52 05/25/2018 04:21 05/25/2018 04:39 05/25/2018 05:29 05/25/2018 07:41  Glucose-Capillary Latest Ref Range: 70 - 99 mg/dL 70 56 (L) 139 (H) 98 97    Diabetes history: None Inpatient Diabetes Program Recommendations:   Please consider d/c of Novolog correction- CBG's less than 100 mg/dL.    Thanks,  Adah Perl, RN, BC-ADM Inpatient Diabetes Coordinator Pager [phone] (8a-5p)

## 2018-05-25 NOTE — Progress Notes (Addendum)
CRITICAL VALUE ALERT  Critical Value: lactic acid 3.2  Date & Time Noted:  0007  Provider Notified:   Orders Received/Actions taken: trending down

## 2018-05-25 NOTE — Progress Notes (Signed)
Progress Note  Patient Name: Howard Thompson Date of Encounter: 05/25/2018  Primary Cardiologist: new; Dr. Gwenlyn Found  Subjective   Intubated on vent;   Inpatient Medications    Scheduled Meds: . sodium chloride   Intravenous Once  . atorvastatin  80 mg Oral q1800  . chlorhexidine gluconate (MEDLINE KIT)  15 mL Mouth Rinse BID  . Chlorhexidine Gluconate Cloth  6 each Topical Daily  . famotidine  20 mg Oral Daily  . heparin  5,000 Units Subcutaneous Q8H  . [START ON 05/26/2018] hydrocortisone sod succinate (SOLU-CORTEF) inj  50 mg Intravenous Daily  . insulin aspart  0-9 Units Subcutaneous Q4H  . mouth rinse  15 mL Mouth Rinse 10 times per day  . sodium chloride flush  10-40 mL Intracatheter Q12H  . sodium chloride flush  3 mL Intravenous Q12H  . ticagrelor  90 mg Per Tube BID  . zinc sulfate  220 mg Oral Daily   Continuous Infusions: . sodium chloride Stopped (05/25/18 1135)  . sodium chloride 10 mL/hr at 05/25/18 1700  . sodium chloride Stopped (05/24/18 1724)  . sodium chloride    . dexmedetomidine (PRECEDEX) IV infusion 0.6 mcg/kg/hr (05/25/18 1700)  . dextrose Stopped (05/25/18 0931)  . feeding supplement (VITAL AF 1.2 CAL) 1,500 mL (05/25/18 1640)  . norepinephrine (LEVOPHED) Adult infusion 12 mcg/min (05/25/18 1700)  . piperacillin-tazobactam (ZOSYN)  IV Stopped (05/25/18 1443)  . dialysis replacement fluid (prismasate) 500 mL/hr at 05/25/18 1641  . dialysis replacement fluid (prismasate) 500 mL/hr at 05/25/18 1641  . dialysate (PRISMASATE) 1,500 mL/hr at 05/25/18 1640   PRN Meds: sodium chloride, sodium chloride, Place/Maintain arterial line **AND** sodium chloride, docusate, fentaNYL (SUBLIMAZE) injection, fentaNYL (SUBLIMAZE) injection, heparin, midazolam, midazolam, sodium chloride flush, sodium chloride flush   Vital Signs    Vitals:   05/25/18 1630 05/25/18 1645 05/25/18 1700 05/25/18 1715  BP:   112/69   Pulse: 86 86 86 83  Resp: '20 20 20 20  '$ Temp:        TempSrc:      SpO2: 100% 100% 99% 100%  Weight:      Height:        Intake/Output Summary (Last 24 hours) at 05/25/2018 1727 Last data filed at 05/25/2018 1700 Gross per 24 hour  Intake 3438.45 ml  Output 859 ml  Net 2579.45 ml    I/O since admission: +13118  Filed Weights   05/24/18 0043 05/24/18 0500 05/25/18 0600  Weight: 174 lb 13.2 oz (79.3 kg) 177 lb 14.6 oz (80.7 kg) 178 lb 9.2 oz (81 kg)    Telemetry    Sinus at 79 - Personally Reviewed  ECG    ECG (independently read by me): NSR at 74; QS V1-2; QTc 441 msec  Physical Exam   BP 112/69   Pulse 83   Temp 98.8 F (37.1 C) (Core)   Resp 20   Ht '5\' 10"'$  (1.778 m)   Wt 178 lb 9.2 oz (81 kg)   SpO2 100%   BMI 25.62 kg/m  General: sedated on vent Skin: normal turgor, no rashes, warm and dry HEENT: Normocephalic, atraumatic. Pupils equal round and reactive to light; sclera anicteric; extraocular muscles intact; Nose without nasal septal hypertrophy Mouth/Parynx benign; Mallinpatti scale Neck: No JVD, no carotid bruits; normal carotid upstroke Lungs: clear to ausculatation and percussion; no wheezing or rales Chest wall: without tenderness to palpitation Heart: PMI not displaced, RRR, s1 s2 normal, 1/6 systolic murmur, no diastolic murmur, no rubs, gallops, thrills,  or heaves Abdomen: soft, nontender; no hepatosplenomehaly, BS+; abdominal aorta nontender and not dilated by palpation. Back: no CVA tenderness Pulses 2+ R groin trialysis catheter for dialysis Musculoskeletal: full range of motion, normal strength, no joint deformities Extremities: no clubbing cyanosis or edema, Homan's sign negative  Neurologic: grossly nonfocal; Cranial nerves grossly wnl     Labs    Chemistry Recent Labs  Lab 05/24/18 1010 05/24/18 1359 05/24/18 1942 05/25/18 0409  NA 142 140 139 139  K 4.1 3.6 3.7 4.0  CL 97* 96* 93* 93*  CO2 '28 28 29 31  '$ GLUCOSE 120* 112* 95 60*  BUN 29* 31* 34* 39*  CREATININE 2.43* 2.56* 3.02*  3.74*  CALCIUM 6.4* 6.1* 6.1* 6.5*  PROT 4.2*  --   --  4.7*  ALBUMIN 2.4*  --   --  2.5*  AST >10,000*  --   --  9,330*  ALT 8,869*  --   --  6,238*  ALKPHOS 107  --   --  105  BILITOT 1.4*  --   --  2.5*  GFRNONAA 29* 27* 22* 17*  GFRAA 34* 32* 26* 20*  ANIONGAP 17* 16* 17* 15     Hematology Recent Labs  Lab 05/23/18 2253 05/24/18 0620 05/25/18 0523  WBC 6.4 9.3 15.9*  RBC 4.10* 4.27 3.78*  HGB 11.2* 11.6* 10.3*  HCT 36.0* 37.8* 31.1*  MCV 87.8 88.5 82.3  MCH 27.3 27.2 27.2  MCHC 31.1 30.7 33.1  RDW 18.9* 19.1* 18.9*  PLT 142* 148* 123*    Cardiac Enzymes Recent Labs  Lab 05/23/18 0439 05/23/18 0822 05/23/18 1206 05/23/18 2253  TROPONINI 2.05* 1.69* 1.74* 1.99*    Recent Labs  Lab 05/22/18 1736  TROPIPOC 0.11*     BNP Recent Labs  Lab 05/22/18 2027  BNP 276.5*     DDimer No results for input(s): DDIMER in the last 168 hours.   Lipid Panel     Component Value Date/Time   CHOL 125 05/22/2018 1745   TRIG 59 05/22/2018 2026   HDL 40 (L) 05/22/2018 1745   CHOLHDL 3.1 05/22/2018 1745   VLDL 11 05/22/2018 1745   LDLCALC 74 05/22/2018 1745    Radiology    Dg Chest Port 1 View  Result Date: 05/24/2018 CLINICAL DATA:  ETT present,Displacement of central venous catheter EXAM: PORTABLE CHEST 1 VIEW COMPARISON:  05/23/2018 FINDINGS: Endotracheal tube is in place with tip 2.2 centimeters above the carina. The nasogastric tube is in place, tip overlying the level of the stomach. LEFT IJ central line tip is directed superiorly possibly in the RIGHT brachiocephalic vein and is unchanged. There is a significant opacity in the MEDIAL lung bases bilaterally, RIGHT greater than LEFT and stable in appearance. IMPRESSION: Stable bilateral LOWER lobe infiltrates. LEFT IJ central line remains superiorly directed in the RIGHT superior mediastinum. Electronically Signed   By: Nolon Nations M.D.   On: 05/24/2018 09:07    Cardiac Studies    History obtained from  chart review.  52 year old married Caucasian male with a history of prior thymectomy for myasthenia gravis as well as cocaine abuse who apparently was found down this afternoon in front of his mailbox and had bystander CPR.  EMS was called.  He was shocked 4 times and received intravenous epinephrine.  He had return of spontaneous circulation in sinus rhythm and was brought emergently to [facility].  His initial EKG showed ST segment elevation in leads the 3 4 and 5 but ultimately  this improved somewhat.  His blood pressure remained stable.  He was intubated and sedated in the ER.  Head CT showed no bleed.  He is brought urgently to the Cath Lab for angiography potential intervention.       Post-Intervention Diagram          ------------------------------------------------------------------- ECHO Study Conclusions  - Left ventricle: The cavity size was normal. Systolic function was   severely reduced. The estimated ejection fraction was in the   range of 25% to 30%. Severe diffuse hypokinesis with regional   variations. There is akinesis of the apicalanterior and apical   myocardium. There is akinesis of the midanteroseptal myocardium.   There is severe hypokinesis of the inferoseptal myocardium.   Features are consistent with a pseudonormal left ventricular   filling pattern, with concomitant abnormal relaxation and   increased filling pressure (grade 2 diastolic dysfunction).   Doppler parameters are consistent with high ventricular filling   pressure. - Aortic valve: A bicuspid morphology cannot be excluded; mildly   thickened, mildly calcified leaflets. There was mild to moderate   regurgitation. Regurgitation pressure half-time: 718 ms. - Mitral valve: There was mild to moderate regurgitation. Valve   area by pressure half-time: 2.39 cm^2. - Right ventricle: Systolic function was moderately reduced. - Atrial septum: There was increased thickness of the septum,    consistent with lipomatous hypertrophy. - Tricuspid valve: There was mild regurgitation. - Pulmonary arteries: PA peak pressure: 33 mm Hg (S). - Inferior vena cava: The vessel was dilated. The respirophasic   diameter changes were blunted (< 50%), consistent with elevated   central venous pressure. - Pericardium, extracardiac: A small, free-flowing pericardial   effusion was identified circumferential to the heart. The fluid   had no internal echoes.There was no evidence of hemodynamic   compromise.  Impressions:  - severe LV dysfunction with diffuse HK but apica/anteroseptal and   apical anterior AK. Recommend definity contrast study to rule out   apical thrombus.   Patient Profile     52 y.o. male with history of myasthenia gravis post thymus resection, MI several years ago, hypertension who presented to the hospital with cardiac arrest, found to have anterior ST elevation and is now status post LAD stent.   Assessment & Plan    1. Out of hospital cardiac arrest: Day 3 2/2 LAD occlusion with Anterior STEMI; s/p DES stent to LAD. According to separated wife, pt had been c/o chest pain recently. Trop 2.15.  Drug screen + for cocaine. S/p hypothermia protocol, rewarmed yesterday, pads still in place .On ASA/Brilinta.  No recurrent VT/VF no longer on amiodarone.  2. Cardiogenic shock: improved; now off Epi, and levophed weaned from 30 to 10;  Current BP 112/69  3. Shock Liver:  AST > 10,000 yesterday, 9330 today; ALT 8369 -> 6238.  DC atorvastatin  4. Anoxic encepholopathy; Initial EEG abnormal; to repeat once normothermic.  5. Respiratory arrest; on vent; per CCM with possible aspiration pneumonia on Zosyn, Vanc dc'd  6. Acute kidney injury: Cr increased to 2.16 --> 3.74  contributed by shock, hypotension and contrast; now getting CRT.  7. Hypokalemia;  Improved today to 4.0  8. Substance abuse;  + cocaine;  ETOH withdrawal today   Discussed with family members Time spent:  40 minutes  Signed, Troy Sine, MD, [facility] 05/25/2018, 5:27 PM

## 2018-05-25 NOTE — Progress Notes (Signed)
PULMONARY / CRITICAL CARE MEDICINE  Name: Howard Thompson MRN: [id number] DOB: 02-Jun-1966    ADMISSION DATE:  05/22/2018 CONSULTATION DATE:  05/22/2018  REFERRING MD: Dr. Quay Burow, MD      CHIEF COMPLAINT:  STEMI, Shock   HISTORY OF PRESENT ILLNESS:   Howard Thompson, 52 y.o. male admitted on 05/22/2018 s/p unwitnessed out of hospital cardiac arrest, VF, unknown downtime, past medical history of coronary artery disease, myasthenia gravis, hypertension.  Per the electronic medical record the patient received 5 defibrillations and given 300 mg amiodarone.  Upon arrival the patient was found to have a STEMI and was taken to the Cath Lab where a DES was placed and a 100% LAD lesion.  Post catheter patient was taken for therapeutic hypothermia to the intensive care unit.  Per the initial critical care documentation there was concern for posturing.  EKGs initially with a prolonged QT at 560, now improved.  He was initially placed in a c-collar, presenting head CT and CT cervical spine negative for acute abnormalities.   SUBJECTIVE:  Remained coagulopathic. He was given FFP to improve INR overnight. Held off on dialysis catheter placement. We can re-attempt today. Neurologically improving per nursing attempted to follow commands yesterday evening.    VITAL SIGNS: BP (!) 126/57 (BP Location: Left Arm)   Pulse (!) 125   Temp 98.2 F (36.8 C) (Core)   Resp (!) 24   Ht 5\' 10"  (1.778 m)   Wt 178 lb 9.2 oz (81 kg)   SpO2 100%   BMI 25.62 kg/m   HEMODYNAMICS: CVP:  [10 mmHg-17 mmHg] 12 mmHg  VENTILATOR SETTINGS: Vent Mode: PRVC FiO2 (%):  [50 %-100 %] 50 % Set Rate:  [24 bmp-30 bmp] 24 bmp Vt Set:  [580 mL] 580 mL PEEP:  [12 cmH20] 12 cmH20 Plateau Pressure:  [21 cmH20-28 cmH20] 21 cmH20  INTAKE / OUTPUT: I/O last 3 completed shifts: In: 8224.9 [I.V.:6613; Blood:596; NG/GT:180; IV Piggyback:836] Out: 1435 [Urine:485; Emesis/NG output:850; Stool:100]  PHYSICAL EXAMINATION: General:  Elderly male, intubate resting in bed, on mechanical ventilation  Neuro: Following basic commands, able to close eyes on commands, seemingly purposeful movements of the BL UE and LE HEENT: NCAT, c-sollar in place, pupils reactive  Cardiovascular: RRR, s1 s2, no murmur Lungs: Bilateral ventilated breath sounds, no wheeze  Abdomen: soft, NT, mildly distended  Musculoskeletal: cooling pads in place, moving all four ext, radial and DP pulses present  Skin: warm, BL LE edema   LABS:  BMET Recent Labs  Lab 05/24/18 1359 05/24/18 1942 05/25/18 0409  NA 140 139 139  K 3.6 3.7 4.0  CL 96* 93* 93*  CO2 28 29 31   BUN 31* 34* 39*  CREATININE 2.56* 3.02* 3.74*  GLUCOSE 112* 95 60*    Electrolytes Recent Labs  Lab 05/23/18 0439  05/23/18 2253  05/24/18 0620  05/24/18 1359 05/24/18 1942 05/25/18 0409  CALCIUM 8.0*   < > 6.5*   < > 6.7*   < > 6.1* 6.1* 6.5*  MG 2.0  --  1.6*  --  2.4  --   --   --  1.7  PHOS 2.6  --   --   --  9.6*  --   --   --  4.3   < > = values in this interval not displayed.    CBC Recent Labs  Lab 05/23/18 2253 05/24/18 0620 05/25/18 0523  WBC 6.4 9.3 15.9*  HGB 11.2* 11.6* 10.3*  HCT 36.0* 37.8* 31.1*  PLT 142* 148* 123*    Coag's Recent Labs  Lab 05/22/18 2027 05/23/18 0439 05/24/18 1010 05/25/18 0409  APTT 122* 59*  --  43*  INR 5.53* 1.55 3.87 2.25    Sepsis Markers Recent Labs  Lab 05/22/18 2027  05/23/18 2253 05/24/18 0412 05/24/18 1756  LATICACIDVEN 2.8*   < > 10.2* 11.8* 5.2*  PROCALCITON 0.14  --   --   --   --    < > = values in this interval not displayed.    ABG Recent Labs  Lab 05/24/18 1804 05/25/18 0426 05/25/18 0745  PHART 7.482* 7.560* 7.500*  PCO2ART 42.2 41.8 40.6  PO2ART 199* 178.0* 129*    Liver Enzymes Recent Labs  Lab 05/24/18 1010  AST >10,000*  ALT 8,869*  ALKPHOS 107  BILITOT 1.4*  ALBUMIN 2.4*    Cardiac Enzymes Recent Labs  Lab 05/23/18 0822 05/23/18 1206 05/23/18 2253  TROPONINI  1.69* 1.74* 1.99*    Glucose Recent Labs  Lab 05/25/18 0316 05/25/18 0352 05/25/18 0421 05/25/18 0439 05/25/18 0529 05/25/18 0741  GLUCAP 76 70 56* 139* 98 97    Imaging Chest xray imaging reviewed by me. Bilateral hilar opacities, displaced left IJ CVC  STUDIES:  LHC 05/22/2018 by Dr. Harriet Butte LAD to Prox LAD lesion is 100% stenosed.  A drug-eluting stent was successfully placed using a STENT SYNERGY DES 3X20.  Post intervention, there is a 0% residual stenosis.  There is severe left ventricular systolic dysfunction.  LV end diastolic pressure is mildly elevated.  The left ventricular ejection fraction is less than 25% by visual estimate.  ECHO 05/23/2018: EF 32-35%, Grade 2 Diastolic dysfunction  Regional wall motion abnormalities   EKG 05/23/2018: OTc 560 >> 441 >> 441  CULTURES: 05/22/2018 Tracheal Aspirate  -  Few GPCs and GNRs - PENDING   ANTIBIOTICS: Pip/tazo: Full Day 1 05/23/2018: Vancomycin: Full Day 1 05/23/2018  SIGNIFICANT EVENTS: 7/28 - Stopped external cooling  LINES/TUBES: Left IJ CVC   ASSESSMENT / PLAN:  1. Acute Hypoxemic Respiratory Failure s/p Cardiac Arrest, s/p 5 defibrillations, 300mg  Amiodarone with ROSC, CXR with bilateral hilar and basilar opacities, requiring mechanical ventilation  - Probable aspiration pneumonitis, aspiration pneumonia, tracheal aspirate with gram positive cocci and gram negative rods, cultures are still pending   - continue abx coverage with vancomycin and piptazo   - Changed vent settings, RR to 20, TV 520cc, check ABG in approximately 1 hour 2. Cardiogenic Shock, s/p STEMI with 100% LAD Occlusion with placement of DES, Acute Systolic and Diastolic Heart failure   - Decreasing vasopressor requirements overnight, stopped the vasopressin and Epi   - Continue ASA, 80mg  atorvastatin, antiplatelets per cardiology   - maintain MAP >20mmHg, titrate the NEpi   - Decreased steroids to 50mg  daily  3. Lactic Acidosis,  improving, oliguria, AKI, elevated Scr, worsening creatinine, Mixed Acid base disorder     - Stopped the bicarbonate infusion yesterday   - Rate on ventilator was decreased not to over compensate and make his alkalosis worse   - will discuss with nephrology, may consider holding on CRRT but he will need fluid removed  - question lasix trial of lasix 4. Likely ICU induced and pressor induced gastritis, blood OGT suctioning  - continue H2 blocker  5. Coagulopathy, elevated INR, likely related to Shock Liver, Elevated Liver enzymes  Chronic alcoholism, daily drinker  - avoid liver toxic drugs, I stopped the prn tylenol   - repeat coags and hepatic  function daily   - started oral zinc supplement and given a dose of vitamin K  6. Prior prolonged QTc, now improved on most recent EKGs   - stopped prn zofran due to this history   - Avoid Qt Prolonging agents  7. Neurologic status seems to be improving, he does have history of alcohol use, in window for withdrawal    - Maintain RASS 0 to -1  - continue PRN Fentanyl   - Added prn versed  8. Drainage from the right nare, improved   - We will continue to observe  9. Hypoglycemia overnight  - currently on D10   - recommend starting tube feeds and stopping the D10  10. Displacement left IJ CVC, appearing to be turning upwards toward the right subclavian, this needs to be repositioned however currently does draw blood appropriately per nursing staff.   - hold off on repositioning at this time it is functioning appropriately  11. Thrombocytopenia  - mutifactorial, sepsis, shock, ICU acquired  - continue to follow   DVT PPX: Heparin, SCDs   FAMILY  - Updates: I spoke with patients sister and sister-in-law at the bedside this morning.   The patient is critically ill with multiple organ systems failure and requires high complexity decision making for assessment and support, frequent evaluation and titration of therapies, application of advanced  monitoring technologies and extensive interpretation of multiple databases. Critical Care Time devoted to patient care services described in this note is 40 minutes.  Garner Nash, DO  Pulmonary and [facility] Pager: [phone]  05/25/2018, 8:10 AM

## 2018-05-25 NOTE — Progress Notes (Addendum)
[facility] ROUNDING NOTE   Subjective:   Interval History: none. Patient is intubated.   Objective:  Vital signs in last 24 hours:  Temp:  [97.2 F (36.2 C)-99.5 F (37.5 C)] 98.4 F (36.9 C) (07/30 0600) Pulse Rate:  [85-130] 88 (07/30 0700) Resp:  [13-30] 24 (07/30 0700) BP: (84-157)/(44-112) 84/49 (07/30 0700) SpO2:  [91 %-100 %] 99 % (07/30 0700) Arterial Line BP: (97-197)/(46-89) 105/49 (07/30 0700) FiO2 (%):  [50 %-100 %] 50 % (07/30 0430) Weight:  [178 lb 9.2 oz (81 kg)] 178 lb 9.2 oz (81 kg) (07/30 0600)  Weight change: 3 lb 12 oz (1.7 kg) Filed Weights   05/24/18 0043 05/24/18 0500 05/25/18 0600  Weight: 174 lb 13.2 oz (79.3 kg) 177 lb 14.6 oz (80.7 kg) 178 lb 9.2 oz (81 kg)    Intake/Output: I/O last 3 completed shifts: In: 8224.9 [I.V.:6613; Blood:596; NG/GT:180; IV Piggyback:836] Out: 1435 [Urine:485; Emesis/NG output:850; Stool:100]   Intake/Output this shift:  No intake/output data recorded.  Physical Exam: General-More alert today, opens eyes to verbal stimuli, inconsistently following commands.  CVS- RRR, no mrg's RS- Mild wheezing bilaterally ABD- BS present soft non-distended EXT- mild edema bilaterally   Basic Metabolic Panel: Recent Labs  Lab 05/22/18 2027  05/23/18 0439  05/23/18 2253  05/24/18 0620 05/24/18 1010 05/24/18 1359 05/24/18 1942 05/25/18 0409  NA 138   < > 142   < > 145   < > 144 142 140 139 139  K 3.9   < > 2.8*   < > 3.1*   < > 4.1 4.1 3.6 3.7 4.0  CL 108   < > 109   < > 108   < > 102 97* 96* 93* 93*  CO2 19*  --  22   < > 21*   < > 20* '28 28 29 31  '$ GLUCOSE 188*   < > 176*   < > 154*   < > 152* 120* 112* 95 60*  BUN 20   < > 24*   < > 27*   < > 28* 29* 31* 34* 39*  CREATININE 0.99   < > 1.14   < > 1.81*   < > 2.16* 2.43* 2.56* 3.02* 3.74*  CALCIUM 8.3*  --  8.0*   < > 6.5*   < > 6.7* 6.4* 6.1* 6.1* 6.5*  MG 2.0  --  2.0  --  1.6*  --  2.4  --   --   --  1.7  PHOS 4.1  --  2.6  --   --   --  9.6*  --   --    --  4.3   < > = values in this interval not displayed.    Liver Function Tests: Recent Labs  Lab 05/24/18 1010  AST >10,000*  ALT 8,869*  ALKPHOS 107  BILITOT 1.4*  PROT 4.2*  ALBUMIN 2.4*   No results for input(s): LIPASE, AMYLASE in the last 168 hours. No results for input(s): AMMONIA in the last 168 hours.  CBC: Recent Labs  Lab 05/22/18 1744  05/23/18 0439 05/23/18 2121 05/23/18 2253 05/24/18 0620 05/25/18 0523  WBC 14.7*  --  5.7  --  6.4 9.3 15.9*  NEUTROABS  --   --   --   --  5.7 8.9* 15.1*  HGB 13.0   < > 13.4 11.6* 11.2* 11.6* 10.3*  HCT 42.1   < > 42.1 34.0* 36.0* 37.8* 31.1*  MCV 87.5  --  84.0  --  87.8 88.5 82.3  PLT 341  --  272  --  142* 148* 123*   < > = values in this interval not displayed.   Cardiac Enzymes: Recent Labs  Lab 05/22/18 2027 05/23/18 0439 05/23/18 0822 05/23/18 1206 05/23/18 2253  TROPONINI 1.19* 2.05* 1.69* 1.74* 1.99*   BNP: Invalid input(s): POCBNP  CBG: Recent Labs  Lab 05/25/18 0316 05/25/18 0352 05/25/18 0421 05/25/18 0439 05/25/18 0529  GLUCAP 76 70 56* 139* 98   Microbiology: Results for orders placed or performed during the hospital encounter of 05/22/18  MRSA PCR Screening     Status: None   Collection Time: 05/22/18  8:24 PM  Result Value Ref Range Status   MRSA by PCR NEGATIVE NEGATIVE Final    Comment:        The GeneXpert MRSA Assay (FDA approved for NASAL specimens only), is one component of a comprehensive MRSA colonization surveillance program. It is not intended to diagnose MRSA infection nor to guide or monitor treatment for MRSA infections. Performed at [facility], [place] [address], [place], [place] [address]   Urine culture     Status: None   Collection Time: 05/22/18  8:35 PM  Result Value Ref Range Status   Specimen Description URINE, RANDOM  Final   Special Requests NONE  Final   Culture   Final    NO GROWTH Performed at [facility], [place] [address],  [place], [place] [address]    Report Status 05/24/2018 FINAL  Final  Culture, respiratory (tracheal aspirate)     Status: None (Preliminary result)   Collection Time: 05/22/18  8:35 PM  Result Value Ref Range Status   Specimen Description TRACHEAL ASPIRATE  Final   Special Requests NONE  Final   Gram Stain   Final    FEW WBC PRESENT, PREDOMINANTLY PMN ABUNDANT GRAM POSITIVE COCCI IN CHAINS IN PAIRS FEW GRAM NEGATIVE RODS FEW YEAST    Culture   Final    CULTURE REINCUBATED FOR BETTER GROWTH Performed at [facility], [place] [address] [place], [place] [address]    Report Status PENDING  Incomplete  Culture, blood (routine x 2)     Status: None (Preliminary result)   Collection Time: 05/22/18  9:57 PM  Result Value Ref Range Status   Specimen Description BLOOD  Final   Special Requests   Final    FROM SHEATH BOTTLES DRAWN AEROBIC AND ANAEROBIC Blood Culture adequate volume   Culture   Final    NO GROWTH 2 DAYS Performed at [facility], [place] [address] [place], [place] [address]    Report Status PENDING  Incomplete  Culture, blood (routine x 2)     Status: None (Preliminary result)   Collection Time: 05/22/18 10:03 PM  Result Value Ref Range Status   Specimen Description BLOOD CENTRAL LINE  Final   Special Requests   Final    BOTTLES DRAWN AEROBIC AND ANAEROBIC Blood Culture adequate volume   Culture   Final    NO GROWTH 2 DAYS Performed at [facility], [address] [place], [place] [address]    Report Status PENDING  Incomplete    Coagulation Studies: Recent Labs    05/22/18 1744 05/22/18 2027 05/23/18 0439 05/24/18 1010 05/25/18 0409  LABPROT 13.8 49.9* 18.4* 37.7* 24.7*  INR 1.07 5.53* 1.55 3.87 2.25    Urinalysis: Recent Labs    05/22/18 2024  COLORURINE YELLOW  LABSPEC >1.046*  PHURINE 5.0  GLUCOSEU NEGATIVE  HGBUR LARGE*  BILIRUBINUR NEGATIVE  KETONESUR NEGATIVE  PROTEINUR 30*  NITRITE NEGATIVE  LEUKOCYTESUR NEGATIVE     Imaging: Dg Chest Port 1 View  Result Date: 05/24/2018 CLINICAL DATA:  ETT present,Displacement of central venous catheter EXAM: PORTABLE CHEST 1 VIEW COMPARISON:  05/23/2018 FINDINGS: Endotracheal tube is in place with tip 2.2 centimeters above the carina. The nasogastric tube is in place, tip overlying the level of the stomach. LEFT IJ central line tip is directed superiorly possibly in the RIGHT brachiocephalic vein and is unchanged. There is a significant opacity in the MEDIAL lung bases bilaterally, RIGHT greater than LEFT and stable in appearance. IMPRESSION: Stable bilateral LOWER lobe infiltrates. LEFT IJ central line remains superiorly directed in the RIGHT superior mediastinum. Electronically Signed   By: Nolon Nations M.D.   On: 05/24/2018 09:07     Medications:   . sodium chloride Stopped (05/23/18 2154)  . sodium chloride Stopped (05/25/18 0510)  . sodium chloride Stopped (05/24/18 1724)  . sodium chloride    . dextrose 50 mL/hr at 05/25/18 0700  . epinephrine Stopped (05/25/18 0313)  . norepinephrine (LEVOPHED) Adult infusion 15 mcg/min (05/25/18 0700)  . piperacillin-tazobactam (ZOSYN)  IV 12.5 mL/hr at 05/25/18 0700  . dialysis replacement fluid (prismasate)    . dialysis replacement fluid (prismasate)    . dialysate (PRISMASATE)    . vancomycin Stopped (05/24/18 1824)  . vasopressin (PITRESSIN) infusion - *FOR SHOCK* Stopped (05/24/18 0519)   . sodium chloride   Intravenous Once  . atorvastatin  80 mg Oral q1800  . chlorhexidine gluconate (MEDLINE KIT)  15 mL Mouth Rinse BID  . Chlorhexidine Gluconate Cloth  6 each Topical Daily  . famotidine  20 mg Oral Daily  . hydrocortisone sod succinate (SOLU-CORTEF) inj  50 mg Intravenous Q6H  . insulin aspart  0-9 Units Subcutaneous Q4H  . mouth rinse  15 mL Mouth Rinse 10 times per day  . sodium chloride flush  10-40 mL Intracatheter Q12H  . sodium chloride flush  3 mL Intravenous Q12H  . ticagrelor  90 mg Per Tube BID    sodium chloride, sodium chloride, Place/Maintain arterial line **AND** sodium chloride, acetaminophen (TYLENOL) oral liquid 160 mg/5 mL **OR** acetaminophen, docusate, fentaNYL (SUBLIMAZE) injection, fentaNYL (SUBLIMAZE) injection, heparin, sodium chloride flush, sodium chloride flush  Patient Profile: Howard Thompson is a 52 y.o. male w/ a PMHx notable CAD prior MI, HTN, myasthenia gravis s/p thymectomy, brought to the ED following sudden cardiac arrest with LHC finding 100% LAD occlusion prompting DES placement with good post procedural flow. Decreased renal output with increased volume in the setting of acidosis and increasing serum creatinine prompting ongoing consideration for CRRT.  Assessment/ Plan:  1. Renal- Scr again increased today with minimal urinary output. Patient now positive 11.5liters since admission. Will need volume reduced to decrease ventilator requirements as per PCCM primary team. Agree that CRRT continues to remain useful in this scenario but also agree that a trial of high dose diuretics are warranted.  --Would suggest Lasix '200mg'$  x 1 dose today, if no response will recommend CRRT --Recommend CRRT if lasix challenge fails --Due to development of an alkalotic state despite persistent albeit improving lactic acidosis the bicarb drip was discontinued. --agree with vasopressors to maintain MAP >65 during dialysis, patient weaning off 2. Hypertension/volume  - Vasopressors as per PCCM. Will need to closely monitor his BP during CRRT to prevent hypotension. Volume remains elevated with weight increased  from 149lb on admission to 178 today. 222m urine output in the past 24 hours continues to demonstrate progressively worsening clearance.  3. ACS - As per cardiology.  4. Anemia  - Hgb ~10.3. Stable. Continue to monitor. No acute signs of hemorrhage, hemolysis or history of chronic hematopoetic disease.  5. Anoxic encephalopathy - CT and EEG without notable abnormality. Patient more  alert today, intermittently following brief commands and responding to verbal stimuli.  6. EtOH abuse history - As per family the patient has a history of EtOH abuse. His last drink was just hours prior to the MI. He is being monitored with PRN ativan for agitation.   LOS: 3 LKathi Ludwig MD Internal Medicine PGY-2  Please see attending note/attestation for current assessment and plan.   I saw the patient with the resident and personally evaluated him.  He did not end up requiring RRT yesterday for acidosis as it improved with medical management alone.  He is nearly anuric now though and we will do a high lasix challenge today.  If he does not respond we will recommend to initiate CRRT to prevent further volume overload, though this can be initiated in a thoughtful fashion, taking time to correct coagulopathy prior to catheter placement as he has no emergency indications.  LJannifer HickMD

## 2018-05-25 NOTE — Progress Notes (Signed)
Nutrition Follow-up  DOCUMENTATION CODES:   Not applicable  INTERVENTION:   Tube feeding:  Vital AF 1.2 @ 60 ml/hr Provides 1728 kcals, 108 g of protein and 1166 mL of free water Meets 100% estimated protein needs   NUTRITION DIAGNOSIS:   Increased nutrient needs related to acute illness(AKI requiring CRRT) as evidenced by estimated needs.  Being addressed via TF   GOAL:   Patient will meet greater than or equal to 90% of their needs  Progressing  MONITOR:   Weight trends, Labs, Diet advancement, Vent status, TF tolerance, I & O's  REASON FOR ASSESSMENT:   Ventilator    ASSESSMENT:   Patient with PMH significant for CAD with prior MI, HTN, cocaine abuse, and myasthenia gravis s/p thymectomy. Presents this admission with after being found next to his mailbox unresponsive, by stander CPR was initiated. Admitted for cardiac arrest presumed in the setting of ST elevation MI and 100% LAD lesion.   Patient is currently intubated on ventilator support MV: 10.4 L/min Temp (24hrs), Avg:98.5 F (36.9 C), Min:98.2 F (36.8 C), Max:99 F (37.2 C)  Vital AF 1.2 @ 50, Pro-Sat 30 mL BID ordered by MD this AM per recommendations from RD assessment 7/29  Noted D10 started for hypoglycemia; hoping to wean off with initiation of TF  CRRT not initiated; plan CRRT to be initiated if trial of lasix fails  Net + 12 L since admission. Weight of 68 kg on admission; current wt 81 kg. Weight gains explained by fluid status. Plan to utilize EDW of 68 kg at present  Labs: phosphorus wdl, potassium wdl, corrected calcium 7.7 (L), albumin 2.5 Meds: D10 at 50 ml/hr, calcium gluconate injection, precedex, zinc sulfate capsule    Diet Order:   Diet Order           Diet NPO time specified  Diet effective now          EDUCATION NEEDS:   Not appropriate for education at this time  Skin:  Skin Assessment: Reviewed RN Assessment  Last BM:  05/24/18  Height:   Ht Readings from  Last 1 Encounters:  05/25/18 5\' 10"  (1.778 m)    Weight:   Wt Readings from Last 1 Encounters:  05/25/18 178 lb 9.2 oz (81 kg)    Ideal Body Weight:  75.5 kg  BMI:  Body mass index is 25.62 kg/m.  Estimated Nutritional Needs:   Kcal:  1806 kcals   Protein:  102-122 grams (1.5-1.8 g/kg using dry wt)  Fluid:  per MD   Kerman Passey MS, RD, LDN, CNSC [phone] Pager  [phone] Weekend/On-Call Pager

## 2018-05-26 ENCOUNTER — Inpatient Hospital Stay (HOSPITAL_COMMUNITY): Payer: No Typology Code available for payment source

## 2018-05-26 DIAGNOSIS — Z635 Disruption of family by separation and divorce: Secondary | ICD-10-CM

## 2018-05-26 DIAGNOSIS — I729 Aneurysm of unspecified site: Secondary | ICD-10-CM

## 2018-05-26 LAB — BPAM FFP
BLOOD PRODUCT EXPIRATION DATE: 201908032359
Blood Product Expiration Date: 201908032359
ISSUE DATE / TIME: 201907301037
ISSUE DATE / TIME: 201907301348
Unit Type and Rh: 7300
Unit Type and Rh: 7300

## 2018-05-26 LAB — POCT I-STAT 3, ART BLOOD GAS (G3+)
Acid-Base Excess: 2 mmol/L (ref 0.0–2.0)
Bicarbonate: 26.5 mmol/L (ref 20.0–28.0)
O2 SAT: 97 %
Patient temperature: 37
TCO2: 28 mmol/L (ref 22–32)
pCO2 arterial: 41.9 mmHg (ref 32.0–48.0)
pH, Arterial: 7.409 (ref 7.350–7.450)
pO2, Arterial: 93 mmHg (ref 83.0–108.0)

## 2018-05-26 LAB — GLUCOSE, CAPILLARY
GLUCOSE-CAPILLARY: 102 mg/dL — AB (ref 70–99)
GLUCOSE-CAPILLARY: 106 mg/dL — AB (ref 70–99)
GLUCOSE-CAPILLARY: 124 mg/dL — AB (ref 70–99)
Glucose-Capillary: 107 mg/dL — ABNORMAL HIGH (ref 70–99)
Glucose-Capillary: 119 mg/dL — ABNORMAL HIGH (ref 70–99)
Glucose-Capillary: 123 mg/dL — ABNORMAL HIGH (ref 70–99)
Glucose-Capillary: 89 mg/dL (ref 70–99)

## 2018-05-26 LAB — BASIC METABOLIC PANEL
Anion gap: 11 (ref 5–15)
BUN: 43 mg/dL — AB (ref 6–20)
CO2: 27 mmol/L (ref 22–32)
Calcium: 6.9 mg/dL — ABNORMAL LOW (ref 8.9–10.3)
Chloride: 98 mmol/L (ref 98–111)
Creatinine, Ser: 3.84 mg/dL — ABNORMAL HIGH (ref 0.61–1.24)
GFR calc Af Amer: 19 mL/min — ABNORMAL LOW (ref >60)
GFR calc non Af Amer: 17 mL/min — ABNORMAL LOW (ref >60)
Glucose, Bld: 120 mg/dL — ABNORMAL HIGH (ref 70–99)
Potassium: 4.2 mmol/L (ref 3.5–5.1)
Sodium: 136 mmol/L (ref 135–145)

## 2018-05-26 LAB — HEPATIC FUNCTION PANEL
ALT: 4620 U/L — ABNORMAL HIGH (ref 0–44)
AST: 4085 U/L — ABNORMAL HIGH (ref 15–41)
Albumin: 2.6 g/dL — ABNORMAL LOW (ref 3.5–5.0)
Alkaline Phosphatase: 131 U/L — ABNORMAL HIGH (ref 38–126)
BILIRUBIN DIRECT: 0.9 mg/dL — AB (ref 0.0–0.2)
BILIRUBIN INDIRECT: 1.2 mg/dL — AB (ref 0.3–0.9)
BILIRUBIN TOTAL: 2.1 mg/dL — AB (ref 0.3–1.2)
Total Protein: 5.2 g/dL — ABNORMAL LOW (ref 6.5–8.1)

## 2018-05-26 LAB — CBC WITH DIFFERENTIAL/PLATELET
BASOS ABS: 0 10*3/uL (ref 0.0–0.1)
Basophils Relative: 0 %
Eosinophils Absolute: 0 10*3/uL (ref 0.0–0.7)
Eosinophils Relative: 0 %
HEMATOCRIT: 28.6 % — AB (ref 39.0–52.0)
Hemoglobin: 9 g/dL — ABNORMAL LOW (ref 13.0–17.0)
LYMPHS ABS: 0.7 10*3/uL (ref 0.7–4.0)
Lymphocytes Relative: 6 %
MCH: 27.1 pg (ref 26.0–34.0)
MCHC: 31.5 g/dL (ref 30.0–36.0)
MCV: 86.1 fL (ref 78.0–100.0)
MONO ABS: 0.2 10*3/uL (ref 0.1–1.0)
Monocytes Relative: 2 %
NEUTROS PCT: 92 %
Neutro Abs: 11.2 10*3/uL — ABNORMAL HIGH (ref 1.7–7.7)
Platelets: 77 10*3/uL — ABNORMAL LOW (ref 150–400)
RBC: 3.32 MIL/uL — AB (ref 4.22–5.81)
RDW: 19.5 % — AB (ref 11.5–15.5)
WBC Morphology: INCREASED
WBC: 12.1 10*3/uL — AB (ref 4.0–10.5)

## 2018-05-26 LAB — MAGNESIUM: Magnesium: 2.2 mg/dL (ref 1.7–2.4)

## 2018-05-26 LAB — PREPARE FRESH FROZEN PLASMA
Unit division: 0
Unit division: 0

## 2018-05-26 LAB — BLOOD GAS, ARTERIAL
Acid-Base Excess: 7.8 mmol/L — ABNORMAL HIGH (ref 0.0–2.0)
Bicarbonate: 32 mmol/L — ABNORMAL HIGH (ref 20.0–28.0)
FIO2: 50
MECHVT: 520 mL
O2 SAT: 98.7 %
PATIENT TEMPERATURE: 98.6
PEEP/CPAP: 12 cmH2O
PH ART: 7.45 (ref 7.350–7.450)
PO2 ART: 130 mmHg — AB (ref 83.0–108.0)
RATE: 20 resp/min
pCO2 arterial: 46.7 mmHg (ref 32.0–48.0)

## 2018-05-26 LAB — PHOSPHORUS: PHOSPHORUS: 3.7 mg/dL (ref 2.5–4.6)

## 2018-05-26 MED ORDER — THIAMINE HCL 100 MG/ML IJ SOLN
100.0000 mg | Freq: Every day | INTRAMUSCULAR | Status: DC
  Administered 2018-05-26 – 2018-05-28 (×3): 100 mg via INTRAVENOUS
  Filled 2018-05-26 (×3): qty 2

## 2018-05-26 MED ORDER — FOLIC ACID 1 MG PO TABS
1.0000 mg | ORAL_TABLET | Freq: Every day | ORAL | Status: DC
  Administered 2018-05-26 – 2018-06-07 (×9): 1 mg via ORAL
  Filled 2018-05-26 (×13): qty 1

## 2018-05-26 MED ORDER — VANCOMYCIN HCL IN DEXTROSE 750-5 MG/150ML-% IV SOLN
750.0000 mg | INTRAVENOUS | Status: DC
  Administered 2018-05-26 – 2018-05-27 (×2): 750 mg via INTRAVENOUS
  Filled 2018-05-26 (×3): qty 150

## 2018-05-26 MED ORDER — PIPERACILLIN-TAZOBACTAM 3.375 G IVPB
3.3750 g | Freq: Four times a day (QID) | INTRAVENOUS | Status: DC
  Administered 2018-05-26 – 2018-05-28 (×8): 3.375 g via INTRAVENOUS
  Filled 2018-05-26 (×9): qty 50

## 2018-05-26 NOTE — Progress Notes (Signed)
PULMONARY / CRITICAL CARE MEDICINE  Name: Howard Thompson MRN: [id number] DOB: 1966-02-11    ADMISSION DATE:  05/22/2018 CONSULTATION DATE:  05/22/2018  REFERRING MD: Dr. Quay Burow, MD      CHIEF COMPLAINT:  STEMI, Shock   HISTORY OF PRESENT ILLNESS:   Howard Thompson, 52 y.o. male admitted on 05/22/2018 s/p unwitnessed out of hospital cardiac arrest, VF, unknown downtime, past medical history of coronary artery disease, myasthenia gravis, hypertension.  Per the electronic medical record the patient received 5 defibrillations and given 300 mg amiodarone.  Upon arrival the patient was found to have a STEMI and was taken to the Cath Lab where a DES was placed and a 100% LAD lesion.  Post catheter patient was taken for therapeutic hypothermia to the intensive care unit.  Per the initial critical care documentation there was concern for posturing.  EKGs initially with a prolonged QT at 560, now improved.  He was initially placed in a c-collar, presenting head CT and CT cervical spine negative for acute abnormalities.   7/28 - Stopped hypothermia and started rewarming  7/30 - Started CRRT, on low dose NEpi, Right Femoral HD Catheter placement   SUBJECTIVE:  No issues overnight. Able to start CRRT. His BP has tolerated. Still on low dose NEpi. Discussed care with nephrology and nursing staff. Discussed left leg [place] with vascular tech we appreciate their help. Bedside discussion with family regarding legal issues pertaining to the wife, ongoing divorce as well as patients girlfriend. Please see separate note.   VITAL SIGNS: BP 99/66   Pulse 76   Temp 98.6 F (37 C)   Resp 19   Ht 5\' 10"  (1.778 m)   Wt 179 lb 4.1 oz (81.3 kg)   SpO2 100%   BMI 25.72 kg/m   HEMODYNAMICS: CVP:  [11 mmHg-15 mmHg] 12 mmHg  VENTILATOR SETTINGS: Vent Mode: PRVC FiO2 (%):  [40 %-100 %] 50 % Set Rate:  [20 bmp] 20 bmp Vt Set:  [520 mL] 520 mL PEEP:  [12 cmH20] 12 cmH20 Plateau Pressure:  [15 cmH20-25 cmH20] 18  cmH20  INTAKE / OUTPUT: I/O last 3 completed shifts: In: 4415.6 [I.V.:1267.3; Blood:1236.5; NG/GT:1381.7; IV Piggyback:530.2] Out: 6073 [Urine:100; Emesis/NG output:250; XTGGY:6948; Stool:100]  PHYSICAL EXAMINATION: General: Elderly male, intubated, sedated on mechanical ventilation Neuro: Does respond to painful stimuli, slight withdrawal, pupils reactive, no purposeful movement HEENT: NCAT, sclera clear, pupils small but responsive Cardiovascular: Regular rate and rhythm, S1-S2, no murmurs rubs or gallops Lungs: Bilateral ventilated breath sounds, no crackles Abdomen: Soft, nontender, nondistended sounds present Musculoskeletal: Arctic sun cooling pads present, trace lower extremity edema Skin: cool lower extremity feet and ankles, trace lower extremity edema, dependent edema in the flanks  LABS:  BMET Recent Labs  Lab 05/24/18 1942 05/25/18 0409 05/26/18 0405  NA 139 139 136  K 3.7 4.0 4.2  CL 93* 93* 98  CO2 29 31 27   BUN 34* 39* 43*  CREATININE 3.02* 3.74* 3.84*  GLUCOSE 95 60* 120*    Electrolytes Recent Labs  Lab 05/24/18 0620  05/24/18 1942 05/25/18 0409 05/26/18 0405  CALCIUM 6.7*   < > 6.1* 6.5* 6.9*  MG 2.4  --   --  1.7 2.2  PHOS 9.6*  --   --  4.3 3.7   < > = values in this interval not displayed.    CBC Recent Labs  Lab 05/24/18 0620 05/25/18 0523 05/26/18 0405  WBC 9.3 15.9* 12.1*  HGB 11.6* 10.3* 9.0*  HCT 37.8*  31.1* 28.6*  PLT 148* 123* 77*    Coag's Recent Labs  Lab 05/22/18 2027 05/23/18 0439 05/24/18 1010 05/25/18 0409  APTT 122* 59*  --  43*  INR 5.53* 1.55 3.87 2.25    Sepsis Markers Recent Labs  Lab 05/22/18 2027  05/23/18 2253 05/24/18 0412 05/24/18 1756  LATICACIDVEN 2.8*   < > 10.2* 11.8* 5.2*  PROCALCITON 0.14  --   --   --   --    < > = values in this interval not displayed.    ABG Recent Labs  Lab 05/25/18 0745 05/25/18 1008 05/26/18 0516  PHART 7.500* 7.450 7.409  PCO2ART 40.6 46.7 41.9  PO2ART  129* 130* 93.0    Liver Enzymes Recent Labs  Lab 05/24/18 1010 05/25/18 0409 05/26/18 0405  AST >10,000* 9,330* 4,085*  ALT 8,869* 6,238* 4,620*  ALKPHOS 107 105 131*  BILITOT 1.4* 2.5* 2.1*  ALBUMIN 2.4* 2.5* 2.6*    Cardiac Enzymes Recent Labs  Lab 05/23/18 0822 05/23/18 1206 05/23/18 2253  TROPONINI 1.69* 1.74* 1.99*    Glucose Recent Labs  Lab 05/25/18 1155 05/25/18 1650 05/25/18 2101 05/26/18 0026 05/26/18 0409 05/26/18 0759  GLUCAP 107* 110* 107* 123* 119* 102*    Imaging Chest xray imaging reviewed by me. Bilateral hilar opacities, displaced left IJ CVC  STUDIES:  LHC 05/22/2018 by Dr. Harriet [place] LAD to Prox LAD lesion is 100% stenosed.  A drug-eluting stent was successfully placed using a STENT SYNERGY DES 3X20.  Post intervention, there is a 0% residual stenosis.  There is severe left ventricular systolic dysfunction.  LV end diastolic pressure is mildly elevated.  The left ventricular ejection fraction is less than 25% by visual estimate.  ECHO 05/23/2018: EF 46-27%, Grade 2 Diastolic dysfunction  Regional wall motion abnormalities   EKG 05/23/2018: OTc 560 >> 441 >> 441  CULTURES: 05/22/2018 Tracheal Aspirate  -  Few GPCs and GNRs - PENDING   ANTIBIOTICS: Pip/tazo: 05/23/2018 >>> Vancomycin: 05/23/2018 >>>  SIGNIFICANT EVENTS: 7/28 - Stopped external cooling  LINES/TUBES: Left IJ CVC   ASSESSMENT / PLAN:  1. Acute Hypoxemic Respiratory Failure s/p Cardiac Arrest, s/p 5 defibrillations, 300mg  Amiodarone with ROSC, CXR with bilateral hilar and basilar opacities, requiring mechanical ventilation  - Probable aspiration pneumonitis, aspiration pneumonia, tracheal aspirate with gram positive cocci and gram negative rods, cultures, we are awaiting the cx results to be able to de-escalate abx    - asked RT to decrease Fio2 prior to decreasing PEEP  2. Cardiogenic Shock, s/p STEMI with 100% LAD Occlusion with placement of DES, Acute  Systolic and Diastolic Heart failure   - has completed hypothermia protocol and now rewarmed   - continue to wean NEpi to maintain MAP >65   - maintain antiplatelets per cardiology   - agree with holding statin in the setting of liver injury  3. Acute renal failure, Lactic Acidosis, improving, oliguria, Mixed Acid base disorder     - CRRT per nephrology   - this has greatly improved the patients acid-base issues  4. Likely ICU induced and pressor induced gastritis, blood OGT suctioning  - continue H2 blocker, no change  5. Coagulopathy, elevated INR, likely related to Shock Liver, Elevated Liver enzymes  Chronic alcoholism, daily drinker, substance abuse history   - avoid liver toxic drugs   - started thiamine and folate. The thiamine may help with lactate metabolism as well.  6. Prior prolonged QTc, now improved on most recent EKGs   -  avoiding QtC prolonging agents  7. Neurologic status remains unclear  - RASS goal 0 to -1  - at risk for seizures, I have ordered an EEG   - pending EEG results will recommend Neurology input for prognostication   - AVOID benzos if possible, doing well on precedex and prn fentanyl  8. Drainage from the right nare, improved   - observing 9. Hypoglycemia overnight  - no issues after TF started  10. Displacement left IJ CVC, appearing to be turning upwards toward the right subclavian  - keep left IJ in place for now  - moved vasopressor use to right femoral HD cath pigtail  11. Thrombocytopenia, multifactorial, sepsis, shock, unknown liver history with chronic ETOH use  - continue to observe   DVT PPX: Heparin, SCDs   FAMILY  - Updates: spoke with family including wife at bedside.   Please see separate note regarding call to risk management and the legal issue surrounding the patients divorce.   The patient is critically ill with multiple organ systems failure and requires high complexity decision making for assessment and support, frequent  evaluation and titration of therapies, application of advanced monitoring technologies and extensive interpretation of multiple databases. Critical Care Time devoted to patient care services described in this note is 45 minutes.  Garner Nash, DO  Pulmonary and [place] Pager: [phone]  05/26/2018, 11:30 AM

## 2018-05-26 NOTE — Progress Notes (Signed)
EEG completed, results pending. 

## 2018-05-26 NOTE — Procedures (Signed)
ELECTROENCEPHALOGRAM REPORT   Patient: Howard Thompson       Room #: 9T66M EEG No. ID: [id number] Age: 52 y.o.        Sex: male Referring Physician: Gwenlyn Found Report Date:  05/26/2018        Interpreting Physician: Alexis Goodell  History: Evren Shankland is an 52 y.o. male s/p arrest  Medications:  Pepcid, Folvite, Solu-cortef, Zosyn, B1, Brilinta, Vancomycin, Zinc, Precedex, Levophed, Prismasol  Conditions of Recording:  This is a 21 channel routine scalp EEG performed with bipolar and monopolar montages arranged in accordance to the international 10/20 system of electrode placement. One channel was dedicated to EKG recording.  The patient is in the intubated and sedated state.  Description:  The background activity consists of a low voltage, symmetrical, fairly well organized 4 Hz delta activity that is diffusely distributed and continuous throughout the recording.  At times central sharp transients are noted that resemble vertex central sharp transients of sleep. Also noted are intermittent sharp transients of triphasic morphology that are noted over both hemispheres independently.  Attempts are made to stimulate the patient verbally but no change in the background rhythm is noted.   Hyperventilation and intermittent photic stimulation were not performed.   IMPRESSION: This is an abnormal EEG secondary to general background slowing.  This finding may be seen with a diffuse disturbance that is etiologically nonspecific, but may include a metabolic encephalopathy or medication effect, among other possibilities.  Also noted is intermittent triphasic sharp activity consistent with the patient's metabolic abnormalities.     Alexis Goodell, MD Neurology [phone] 05/26/2018, 4:21 PM

## 2018-05-26 NOTE — Plan of Care (Signed)
  Problem: Clinical Measurements: Goal: Ability to maintain clinical measurements within normal limits will improve Outcome: Progressing Goal: Diagnostic test results will improve Outcome: Progressing   Problem: Cardiac: Goal: Ability to achieve and maintain adequate cardiopulmonary perfusion will improve Outcome: Progressing

## 2018-05-26 NOTE — Plan of Care (Signed)
  Problem: Nutrition: Goal: Adequate nutrition will be maintained Outcome: Completed/Met Note:  Pt receiving tube feeds.   Problem: Elimination: Goal: Will not experience complications related to urinary retention Outcome: Not Progressing Note:  Pt currently on CRRT, anuric.

## 2018-05-26 NOTE — Progress Notes (Addendum)
PCCM:  Long discussion at bedside with the patient's wife.  The patient and her are currently going through a divorce. The patients girlfriend has presented herself stating that she is pregnant. The wife is concerned about legal proceeding and the management of their combined estate in the event that he may pass from this critical event.   I have placed a call and left a message with risk management to discuss this issue.   Garner Nash, DO [facility] 05/26/2018    The family would like to have DNA samples obtained from the patient for their records to prove/disprove paternity in the event the patient was to pass during this hospitalization.   [facility] 05/26/2018

## 2018-05-26 NOTE — Progress Notes (Signed)
[place] with [place] at bedside to obtain buccal sample. Patient's wife, Jacqlyn Larsen, at bedside.

## 2018-05-26 NOTE — Progress Notes (Signed)
Left arterial duplex limited: No evidence of active pseudoaneurysm. A hematoma is noted measuring 3.02 cm. Patent CFA and CFV. Landry Mellow, RDMS, RVT

## 2018-05-26 NOTE — Progress Notes (Addendum)
[name]   Subjective:   Interval History: has no complaint today, remains intubated and sedated.  Objective:  Vital signs in last 24 hours:  Temp:  [97.9 F (36.6 C)-99 F (37.2 C)] 98.8 F (37.1 C) (07/31 0400) Pulse Rate:  [76-139] 77 (07/31 0415) Resp:  [13-25] 20 (07/31 0700) BP: (83-171)/(52-74) 104/72 (07/31 0700) SpO2:  [92 %-100 %] 100 % (07/31 0400) Arterial Line BP: (79-209)/(37-95) 114/59 (07/31 0700) FiO2 (%):  [40 %-100 %] 50 % (07/31 0500) Weight:  [179 lb 4.1 oz (81.3 kg)] 179 lb 4.1 oz (81.3 kg) (07/31 0500)  Weight change: 10.9 oz (0.31 kg) Filed Weights   05/24/18 0500 05/25/18 0600 05/26/18 0500  Weight: 177 lb 14.6 oz (80.7 kg) 178 lb 9.2 oz (81 kg) 179 lb 4.1 oz (81.3 kg)   Intake/Output: I/O last 3 completed shifts: In: 4415.6 [I.V.:1267.3; Blood:1236.5; NG/GT:1381.7; IV Piggyback:530.2] Out: 0539 [Urine:100; Emesis/NG output:250; JQBHA:1937; Stool:100]   Intake/Output this shift:  No intake/output data recorded.  Physical Exam: General- Intubated, afebrile, diaphoretic  CVS- RRR, mrg's RS- CTA ABD- BS present soft non-distended EXT- no edema  Basic Metabolic Panel: Recent Labs  Lab 05/22/18 2027  05/23/18 0439  05/23/18 2253  05/24/18 0620 05/24/18 1010 05/24/18 1359 05/24/18 1942 05/25/18 0409 05/26/18 0405  NA 138   < > 142   < > 145   < > 144 142 140 139 139 136  K 3.9   < > 2.8*   < > 3.1*   < > 4.1 4.1 3.6 3.7 4.0 4.2  CL 108   < > 109   < > 108   < > 102 97* 96* 93* 93* 98  CO2 19*  --  22   < > 21*   < > 20* _0 GLUCOSE 188*   < > 176*   < > 154*   < > 152* 120* 112* 95 60* 120*  BUN 20   < > 24*   < > 27*   < > 28* 29* 31* 34* 39* 43*  CREATININE 0.99   < > 1.14   < > 1.81*   < > 2.16* 2.43* 2.56* 3.02* 3.74* 3.84*  CALCIUM 8.3*  --  8.0*   < > 6.5*   < > 6.7* 6.4* 6.1* 6.1* 6.5* 6.9*  MG 2.0  --  2.0  --  1.6*  --  2.4  --   --   --  1.7 2.2  PHOS 4.1  --  2.6  --   --   --  9.6*   --   --   --  4.3 3.7   < > = values in this interval not displayed.    Liver Function Tests: Recent Labs  Lab 05/24/18 1010 05/25/18 0409 05/26/18 0405  AST >10,000* 9,330* 4,085*  ALT 8,869* 6,238* 4,620*  ALKPHOS 107 105 131*  BILITOT 1.4* 2.5* 2.1*  PROT 4.2* 4.7* 5.2*  ALBUMIN 2.4* 2.5* 2.6*   No results for input(s): LIPASE, AMYLASE in the last 168 hours. No results for input(s): AMMONIA in the last 168 hours.  CBC: Recent Labs  Lab 05/23/18 0439 05/23/18 2121 05/23/18 2253 05/24/18 0620 05/25/18 0523 05/26/18 0405  WBC 5.7  --  6.4 9.3 15.9* 12.1*  NEUTROABS  --   --  5.7 8.9* 15.1* 11.2*  HGB 13.4 11.6* 11.2* 11.6* 10.3* 9.0*  HCT 42.1 34.0* 36.0* 37.8* 31.1* 28.6*  MCV 84.0  --  87.8 88.5 82.3 86.1  PLT 272  --  142* 148* 123* 77*    Cardiac Enzymes: Recent Labs  Lab 05/22/18 2027 05/23/18 0439 05/23/18 0822 05/23/18 1206 05/23/18 2253  TROPONINI 1.19* 2.05* 1.69* 1.74* 1.99*    BNP: Invalid input(s): POCBNP  CBG: Recent Labs  Lab 05/25/18 0741 05/25/18 1155 05/25/18 1650 05/25/18 2101 05/26/18 0026  GLUCAP 97 107* 110* 107* 123*    Microbiology: Results for orders placed or performed during the hospital encounter of 05/22/18  MRSA PCR Screening     Status: None   Collection Time: 05/22/18  8:24 PM  Result Value Ref Range Status   MRSA by PCR NEGATIVE NEGATIVE Final    Comment:        The GeneXpert MRSA Assay (FDA approved for NASAL specimens only), is one component of a comprehensive MRSA colonization surveillance program. It is not intended to diagnose MRSA infection nor to guide or monitor treatment for MRSA infections. Performed at [facility], [place] [address] [place], [place] [address]   Urine culture     Status: None   Collection Time: 05/22/18  8:35 PM  Result Value Ref Range Status   Specimen Description URINE, RANDOM  Final   Special Requests NONE  Final   Culture   Final    NO GROWTH Performed at [facility], [place] [address], [place], [place] [address]    Report Status 05/24/2018 FINAL  Final  Culture, respiratory (tracheal aspirate)     Status: None (Preliminary result)   Collection Time: 05/22/18  8:35 PM  Result Value Ref Range Status   Specimen Description TRACHEAL ASPIRATE  Final   Special Requests NONE  Final   Gram Stain   Final    FEW WBC PRESENT, PREDOMINANTLY PMN ABUNDANT GRAM POSITIVE COCCI IN CHAINS IN PAIRS FEW GRAM NEGATIVE RODS FEW YEAST    Culture   Final    CULTURE REINCUBATED FOR BETTER GROWTH Performed at [facility], [place] [address] [place], [place] [address]    Report Status PENDING  Incomplete  Culture, blood (routine x 2)     Status: None (Preliminary result)   Collection Time: 05/22/18  9:57 PM  Result Value Ref Range Status   Specimen Description BLOOD  Final   Special Requests   Final    FROM SHEATH BOTTLES DRAWN AEROBIC AND ANAEROBIC Blood Culture adequate volume   Culture   Final    NO GROWTH 3 DAYS Performed at [facility], [place] [address], [place], [place] [address]    Report Status PENDING  Incomplete  Culture, blood (routine x 2)     Status: None (Preliminary result)   Collection Time: 05/22/18 10:03 PM  Result Value Ref Range Status   Specimen Description BLOOD CENTRAL LINE  Final   Special Requests   Final    BOTTLES DRAWN AEROBIC AND ANAEROBIC Blood Culture adequate volume   Culture   Final    NO GROWTH 3 DAYS Performed at [facility], [address] [place],  [address]    Report Status PENDING  Incomplete   Coagulation Studies: Recent Labs    05/24/18 1010 05/25/18 0409  LABPROT 37.7* 24.7*  INR 3.87 2.25   Urinalysis: No results for input(s): COLORURINE, LABSPEC, PHURINE, GLUCOSEU, HGBUR, BILIRUBINUR, KETONESUR, PROTEINUR, UROBILINOGEN, NITRITE, LEUKOCYTESUR in the last 72 hours.  Invalid input(s): APPERANCEUR   Imaging: Dg Chest Port 1 View  Result Date: 05/24/2018 CLINICAL DATA:  ETT  present,Displacement of central venous catheter EXAM: PORTABLE CHEST 1 VIEW COMPARISON:  05/23/2018 FINDINGS: Endotracheal tube is in place with tip 2.2 centimeters above the carina. The nasogastric tube is in place, tip overlying the level of the stomach. LEFT IJ central line tip is directed superiorly possibly in the RIGHT brachiocephalic vein and is unchanged. There is a significant opacity in the MEDIAL lung bases bilaterally, RIGHT greater than LEFT and stable in appearance. IMPRESSION: Stable bilateral LOWER lobe infiltrates. LEFT IJ central line remains superiorly directed in the RIGHT superior mediastinum. Electronically Signed   By: Nolon Nations M.D.   On: 05/24/2018 09:07   Medications:   . sodium chloride Stopped (05/25/18 1135)  . sodium chloride 10 mL/hr at 05/26/18 0700  . sodium chloride Stopped (05/24/18 1724)  . sodium chloride    . dexmedetomidine (PRECEDEX) IV infusion 0.6 mcg/kg/hr (05/26/18 0700)  . dextrose Stopped (05/25/18 0931)  . feeding supplement (VITAL AF 1.2 CAL) 1,500 mL (05/25/18 1640)  . norepinephrine (LEVOPHED) Adult infusion 7 mcg/min (05/26/18 0700)  . piperacillin-tazobactam (ZOSYN)  IV Stopped (05/26/18 0645)  . dialysis replacement fluid (prismasate) 500 mL/hr at 05/26/18 0319  . dialysis replacement fluid (prismasate) 500 mL/hr at 05/26/18 0319  . dialysate (PRISMASATE) 1,500 mL/hr at 05/26/18 0630   . sodium chloride   Intravenous Once  . chlorhexidine gluconate (MEDLINE KIT)  15 mL Mouth Rinse BID  . Chlorhexidine Gluconate Cloth  6 each Topical Daily  . famotidine  20 mg Oral Daily  . heparin  5,000 Units Subcutaneous Q8H  . hydrocortisone sod succinate (SOLU-CORTEF) inj  50 mg Intravenous Daily  . insulin aspart  0-9 Units Subcutaneous Q4H  . mouth rinse  15 mL Mouth Rinse 10 times per day  . sodium chloride flush  10-40 mL Intracatheter Q12H  . sodium chloride flush  3 mL Intravenous Q12H  . ticagrelor  90 mg Per Tube BID  . zinc sulfate   220 mg Oral Daily   sodium chloride, sodium chloride, Place/Maintain arterial line **AND** sodium chloride, docusate, fentaNYL (SUBLIMAZE) injection, fentaNYL (SUBLIMAZE) injection, heparin, midazolam, midazolam, sodium chloride flush, sodium chloride flush   Patient Profile: Howard Thompson a 52 y.o.malew/ a PMHx notable CAD prior MI, HTN, myasthenia gravis s/p thymectomy, brought to the ED following sudden cardiac arrest with LHC finding 100% LAD occlusion prompting DES placement with good post procedural flow. Decreased renal output with increased volume in the setting of acidosis and increasing serum creatinine prompting ongoing consideration for CRRT.  Assessment/ Plan:  1. Renal-Initiated CRRT the prior day with an approximate net loss of 161m per hr. The patients BP has tolerated the fluid removal well, in fact, they have titrated his vasopressor down significantly. Continuing to monitor electrolytes, w/ phos, mag and Potassium WNL's. Scr and BUN stable. --agree with vasopressors to maintain MAP >65 during dialysis 2. Hypertension/volume- Vasopressors as per PCCM. Will need to closely monitor his BP during CRRT to prevent hypotension. Volume remains elevated with weight increased from 149lb on admission to 179 today. No urine output in the past 24 hours. 3. ACS - As per cardiology. 4. Anemia-Hgb ~9.0. Stable. Continue to monitor. No acute signs of hemorrhage, hemolysis or history of chronic hematopoetic disease.  5. Anoxic encephalopathy- CT and EEG without notable abnormality. Patient sedated with precedex due to EtOH withdrawal. Unable to assess mental status.  6. EtOH abuse history- As per family the patient has a history of EtOH abuse. His last drink was just hours prior  to the MI. He was placed on precedex for withdraw.   LOS: [place], MD Internal Medicine PGY-2   I personally saw and evaluated the patient.  Discussed with CC MD and family.  Continue CRRT.  Jannifer Hick MD

## 2018-05-26 NOTE — Procedures (Signed)
I evaluated the patient during CRRT and made appropriate adjustments.   FRR 152mL/hr (net).  Electrolytes ok.  Noted plt count down. We are not using any heparin with CRRT but do have it ordered for the catheter dwell if he's off CRRT.    Jannifer Hick MD

## 2018-05-27 DIAGNOSIS — G9341 Metabolic encephalopathy: Secondary | ICD-10-CM

## 2018-05-27 LAB — MAGNESIUM: Magnesium: 2.4 mg/dL (ref 1.7–2.4)

## 2018-05-27 LAB — BASIC METABOLIC PANEL
Anion gap: 8 (ref 5–15)
Anion gap: 13 (ref 5–15)
BUN: 37 mg/dL — ABNORMAL HIGH (ref 6–20)
BUN: 39 mg/dL — AB (ref 6–20)
CHLORIDE: 104 mmol/L (ref 98–111)
CHLORIDE: 100 mmol/L (ref 98–111)
CO2: 27 mmol/L (ref 22–32)
CO2: 25 mmol/L (ref 22–32)
CREATININE: 3.23 mg/dL — AB (ref 0.61–1.24)
Calcium: 7.7 mg/dL — ABNORMAL LOW (ref 8.9–10.3)
Calcium: 8 mg/dL — ABNORMAL LOW (ref 8.9–10.3)
Creatinine, Ser: 3.29 mg/dL — ABNORMAL HIGH (ref 0.61–1.24)
GFR calc non Af Amer: 20 mL/min — ABNORMAL LOW (ref >60)
GFR calc non Af Amer: 21 mL/min — ABNORMAL LOW (ref >60)
GFR, EST AFRICAN AMERICAN: 23 mL/min — AB (ref >60)
GFR, EST AFRICAN AMERICAN: 24 mL/min — AB (ref >60)
GLUCOSE: 147 mg/dL — AB (ref 70–99)
Glucose, Bld: 119 mg/dL — ABNORMAL HIGH (ref 70–99)
POTASSIUM: 4.6 mmol/L (ref 3.5–5.1)
Potassium: 4.8 mmol/L (ref 3.5–5.1)
SODIUM: 139 mmol/L (ref 135–145)
Sodium: 138 mmol/L (ref 135–145)

## 2018-05-27 LAB — RENAL FUNCTION PANEL
ANION GAP: 6 (ref 5–15)
Albumin: 2.4 g/dL — ABNORMAL LOW (ref 3.5–5.0)
BUN: 38 mg/dL — ABNORMAL HIGH (ref 6–20)
CALCIUM: 7.3 mg/dL — AB (ref 8.9–10.3)
CO2: 25 mmol/L (ref 22–32)
Chloride: 102 mmol/L (ref 98–111)
Creatinine, Ser: 3.14 mg/dL — ABNORMAL HIGH (ref 0.61–1.24)
GFR calc Af Amer: 25 mL/min — ABNORMAL LOW (ref >60)
GFR calc non Af Amer: 21 mL/min — ABNORMAL LOW (ref >60)
GLUCOSE: 122 mg/dL — AB (ref 70–99)
Phosphorus: 3.2 mg/dL (ref 2.5–4.6)
Potassium: 7.3 mmol/L (ref 3.5–5.1)
SODIUM: 133 mmol/L — AB (ref 135–145)

## 2018-05-27 LAB — CBC WITH DIFFERENTIAL/PLATELET
BASOS PCT: 0 %
Basophils Absolute: 0 10*3/uL (ref 0.0–0.1)
EOS PCT: 0 %
Eosinophils Absolute: 0 10*3/uL (ref 0.0–0.7)
HEMATOCRIT: 28.2 % — AB (ref 39.0–52.0)
Hemoglobin: 8.9 g/dL — ABNORMAL LOW (ref 13.0–17.0)
LYMPHS ABS: 0.6 10*3/uL — AB (ref 0.7–4.0)
Lymphocytes Relative: 5 %
MCH: 27.2 pg (ref 26.0–34.0)
MCHC: 31.6 g/dL (ref 30.0–36.0)
MCV: 86.2 fL (ref 78.0–100.0)
MONO ABS: 0.5 10*3/uL (ref 0.1–1.0)
MONOS PCT: 4 %
NEUTROS PCT: 91 %
Neutro Abs: 10.7 10*3/uL — ABNORMAL HIGH (ref 1.7–7.7)
PLATELETS: 65 10*3/uL — AB (ref 150–400)
RBC: 3.27 MIL/uL — AB (ref 4.22–5.81)
RDW: 19.2 % — ABNORMAL HIGH (ref 11.5–15.5)
WBC: 11.8 10*3/uL — AB (ref 4.0–10.5)

## 2018-05-27 LAB — HEPATIC FUNCTION PANEL
ALBUMIN: 2.6 g/dL — AB (ref 3.5–5.0)
ALK PHOS: 221 U/L — AB (ref 38–126)
ALT: 3440 U/L — ABNORMAL HIGH (ref 0–44)
AST: 1778 U/L — ABNORMAL HIGH (ref 15–41)
BILIRUBIN TOTAL: 2.2 mg/dL — AB (ref 0.3–1.2)
Bilirubin, Direct: 1.1 mg/dL — ABNORMAL HIGH (ref 0.0–0.2)
Indirect Bilirubin: 1.1 mg/dL — ABNORMAL HIGH (ref 0.3–0.9)
TOTAL PROTEIN: 5.2 g/dL — AB (ref 6.5–8.1)

## 2018-05-27 LAB — GLUCOSE, CAPILLARY
GLUCOSE-CAPILLARY: 89 mg/dL (ref 70–99)
GLUCOSE-CAPILLARY: 110 mg/dL — AB (ref 70–99)
GLUCOSE-CAPILLARY: 118 mg/dL — AB (ref 70–99)
Glucose-Capillary: 131 mg/dL — ABNORMAL HIGH (ref 70–99)
Glucose-Capillary: 126 mg/dL — ABNORMAL HIGH (ref 70–99)
Glucose-Capillary: 126 mg/dL — ABNORMAL HIGH (ref 70–99)
Glucose-Capillary: 126 mg/dL — ABNORMAL HIGH (ref 70–99)

## 2018-05-27 LAB — POCT I-STAT 3, ART BLOOD GAS (G3+)
Acid-Base Excess: 2 mmol/L (ref 0.0–2.0)
BICARBONATE: 26.7 mmol/L (ref 20.0–28.0)
O2 Saturation: 99 %
TCO2: 28 mmol/L (ref 22–32)
pCO2 arterial: 39.4 mmHg (ref 32.0–48.0)
pH, Arterial: 7.437 (ref 7.350–7.450)
pO2, Arterial: 154 mmHg — ABNORMAL HIGH (ref 83.0–108.0)

## 2018-05-27 LAB — PHOSPHORUS: Phosphorus: 2.4 mg/dL — ABNORMAL LOW (ref 2.5–4.6)

## 2018-05-27 LAB — CULTURE, BLOOD (ROUTINE X 2): CULTURE: NO GROWTH

## 2018-05-27 LAB — VANCOMYCIN, TROUGH: Vancomycin Tr: 14 ug/mL — ABNORMAL LOW (ref 15–20)

## 2018-05-27 MED ORDER — SODIUM PHOSPHATES 45 MMOLE/15ML IV SOLN
10.0000 mmol | Freq: Once | INTRAVENOUS | Status: AC
  Administered 2018-05-27: 10 mmol via INTRAVENOUS
  Filled 2018-05-27: qty 3.33

## 2018-05-27 MED ORDER — B COMPLEX-C PO TABS
1.0000 | ORAL_TABLET | Freq: Every day | ORAL | Status: DC
  Administered 2018-05-27 – 2018-06-07 (×7): 1
  Filled 2018-05-27 (×14): qty 1

## 2018-05-27 NOTE — Procedures (Signed)
I evaluated the patient during CRRT and made appropriate adjustments.   FRR 172mL/hr (net).  Electrolytes ok.  Noted plt count down. We are not using any heparin with CRRT but do have it ordered for the catheter dwell if he's off CRRT.    Jannifer Hick MD

## 2018-05-27 NOTE — Progress Notes (Signed)
Pharmacy Antibiotic Note  Howard Thompson is a 52 y.o. male admitted on 05/22/2018 with STEMI s/p cardiac arrest. Pharmacy has been consulted for vancomycin and Zosyn with concern for aspiration. Of note, pt is currently on CVVHD. Blood cultures negative, trach culture growing few S. Aureus pending susceptibilities. Vancomycin trough 14 mcg/ml - drawn appropriately.  Plan: -Continue Zosyn 3.375g IV q6h -Continue vancomycin 750mg  IV q24h -Monitor renal funx, RRT, LOT, cultures  Height: 5\' 10"  (177.8 cm) Weight: 172 lb 6.4 oz (78.2 kg) IBW/kg (Calculated) : 73  Temp (24hrs), Avg:97.8 F (36.6 C), Min:96.8 F (36 C), Max:99 F (37.2 C)  Recent Labs  Lab 05/23/18 0045  05/23/18 2115 05/23/18 2253 05/24/18 0412 05/24/18 0620 05/24/18 1009  05/24/18 1756 05/24/18 1942 05/25/18 0409 05/25/18 0523 05/26/18 0405 05/27/18 0307 05/27/18 1554 05/27/18 1614  WBC  --    < >  --  6.4  --  9.3  --   --   --   --   --  15.9* 12.1* 11.8*  --   --   CREATININE  --    < >  --  1.81* 2.01* 2.16*  --    < >  --  3.02* 3.74*  --  3.84* 3.29*  --  3.14*  LATICACIDVEN 2.3*  --  3.2* 10.2* 11.8*  --   --   --  5.2*  --   --   --   --   --   --   --   VANCOTROUGH  --   --   --   --   --   --   --   --   --   --   --   --   --   --  14*  --   VANCORANDOM  --   --   --   --   --   --  19  --   --   --   --   --   --   --   --   --    < > = values in this interval not displayed.    Estimated Creatinine Clearance: 28.7 mL/min (A) (by C-G formula based on SCr of 3.14 mg/dL (H)).    No Known Allergies  Antimicrobials this admission: 7/27 zosyn >> 7/27 vanc>>   Dose adjustments this admission: 8/1 VT = 14 > no adjustment  Microbiology results: 7/27 urine - ng 7/27 bld x2 - GPR 1/4 7/27 TA - GPC - few staph aureus, pending susceptibilities 7/27 MRSA PCR - negative Thank you for allowing pharmacy to be a part of this patient's care.  Einar Grad 05/27/2018 6:01 PM

## 2018-05-27 NOTE — Progress Notes (Signed)
CRITICAL VALUE ALERT  Critical Value: Potassium 7.3  Date & Time Notied:  1800 05/27/18  Provider Notified: CCM and Nephrology notified  Orders Received/Actions taken: orders given for STAT peripheral IV draw

## 2018-05-27 NOTE — Progress Notes (Signed)
Nutrition Follow-up  DOCUMENTATION CODES:   Not applicable  INTERVENTION:   Tube Feeding:  Vital AF 1.2 @ 60 ml/hr Add Pro-Stat 30 mL daily Provides 1828 kcals, 123 g of protein and 1166 mL of free water  Add B-complex with Vitamin C   Monitor phosphorus, may need aggressive repletion while on CRRT   NUTRITION DIAGNOSIS:   Increased nutrient needs related to acute illness(AKI requiring CRRT) as evidenced by estimated needs.  Being addressed via TF   GOAL:   Patient will meet greater than or equal to 90% of their needs  Met  MONITOR:   Weight trends, Labs, Diet advancement, Vent status, TF tolerance, I & O's  REASON FOR ASSESSMENT:   Ventilator    ASSESSMENT:   Patient with PMH significant for CAD with prior MI, HTN, cocaine abuse, and myasthenia gravis s/p thymectomy. Presents this admission with after being found next to his mailbox unresponsive, by stander CPR was initiated. Admitted for cardiac arrest presumed in the setting of ST elevation MI and 100% LAD lesion.   Patient is currently intubated on ventilator support, on precdex, levophed off MV: 10.8 L/min Temp (24hrs), Avg:97.8 F (36.6 C), Min:96.8 F (36 C), Max:99 F (37.2 C)  Continues on CRRT, UF goal 100 ml/hr Vital AF 1.2 @ 60 ml/hr via OG tube  EDW 68 kg, current wt 78.2 kg; +9 L since admission  Labs: phosphorus 2.4 (L), corrected calcium 8.8 Meds: thiamine, zinc, folic acid  Diet Order:   Diet Order           Diet NPO time specified  Diet effective now          EDUCATION NEEDS:   Not appropriate for education at this time  Skin:  Skin Assessment: Reviewed RN Assessment  Last BM:  7/31  Height:   Ht Readings from Last 1 Encounters:  05/27/18 5' 10" (1.778 m)    Weight:   Wt Readings from Last 1 Encounters:  05/27/18 172 lb 6.4 oz (78.2 kg)    Ideal Body Weight:  75.5 kg  BMI:  Body mass index is 24.74 kg/m.  Estimated Nutritional Needs:   Kcal:   1818  Protein:  102-122 grams (1.5-1.8 g/kg using dry wt)  Fluid:  per MD   Cate  MS, RD, LDN, CNSC [phone] Pager  [phone] Weekend/On-Call Pager  

## 2018-05-27 NOTE — Progress Notes (Signed)
Progress Note  Patient Name: Howard Thompson Date of Encounter: 05/27/2018  Primary Cardiologist: new; Dr. Gwenlyn Found  Subjective   Intubated on vent; but now awake, and responding to commands  Inpatient Medications    Scheduled Meds: . sodium chloride   Intravenous Once  . chlorhexidine gluconate (MEDLINE KIT)  15 mL Mouth Rinse BID  . Chlorhexidine Gluconate Cloth  6 each Topical Daily  . famotidine  20 mg Oral Daily  . folic acid  1 mg Oral Daily  . heparin  5,000 Units Subcutaneous Q8H  . hydrocortisone sod succinate (SOLU-CORTEF) inj  50 mg Intravenous Daily  . insulin aspart  0-9 Units Subcutaneous Q4H  . mouth rinse  15 mL Mouth Rinse 10 times per day  . sodium chloride flush  10-40 mL Intracatheter Q12H  . sodium chloride flush  3 mL Intravenous Q12H  . thiamine injection  100 mg Intravenous Daily  . ticagrelor  90 mg Per Tube BID  . zinc sulfate  220 mg Oral Daily   Continuous Infusions: . sodium chloride Stopped (05/25/18 1135)  . sodium chloride Stopped (05/27/18 0551)  . sodium chloride Stopped (05/24/18 1724)  . sodium chloride    . dexmedetomidine (PRECEDEX) IV infusion 0.6 mcg/kg/hr (05/27/18 0900)  . dextrose Stopped (05/25/18 0931)  . feeding supplement (VITAL AF 1.2 CAL) 60 mL/hr at 05/27/18 0700  . norepinephrine (LEVOPHED) Adult infusion Stopped (05/27/18 0733)  . piperacillin-tazobactam (ZOSYN)  IV 12.5 mL/hr at 05/27/18 0900  . dialysis replacement fluid (prismasate) 500 mL/hr at 05/26/18 2342  . dialysis replacement fluid (prismasate) 500 mL/hr at 05/26/18 2345  . dialysate (PRISMASATE) 1,500 mL/hr at 05/27/18 4492  . sodium phosphate  Dextrose 5% IVPB    . vancomycin Stopped (05/26/18 1749)   PRN Meds: sodium chloride, sodium chloride, Place/Maintain arterial line **AND** sodium chloride, docusate, fentaNYL (SUBLIMAZE) injection, fentaNYL (SUBLIMAZE) injection, heparin, midazolam, midazolam, sodium chloride flush, sodium chloride flush   Vital Signs      Vitals:   05/27/18 0800 05/27/18 0815 05/27/18 0830 05/27/18 0845  BP: 115/73     Pulse: 76 78 80 68  Resp: '17 19 18 20  '$ Temp: 97.7 F (36.5 C) 97.7 F (36.5 C) 97.7 F (36.5 C) 97.7 F (36.5 C)  TempSrc:      SpO2: 100% 99% 99% 99%  Weight:      Height:        Intake/Output Summary (Last 24 hours) at 05/27/2018 0925 Last data filed at 05/27/2018 0900 Gross per 24 hour  Intake 2683.33 ml  Output 4895 ml  Net -2211.67 ml    I/O since admission: +9264  Filed Weights   05/25/18 0600 05/26/18 0500 05/27/18 0400  Weight: 178 lb 9.2 oz (81 kg) 179 lb 4.1 oz (81.3 kg) 172 lb 6.4 oz (78.2 kg)    Telemetry    Sinus at 70 - Personally Reviewed  ECG    ECG (independently read by me): NSR 60; No significant ST changes; QTc 471  05/23/18 ECG (independently read by me): NSR at 74; QS V1-2; QTc 441 msec  Physical Exam   BP 115/73   Pulse 68   Temp 97.7 F (36.5 C)   Resp 20   Ht '5\' 10"'$  (1.778 m)   Wt 172 lb 6.4 oz (78.2 kg)   SpO2 99%   BMI 24.74 kg/m  General: Awake , eyes open on vent  Skin: normal turgor, no rashes, warm and dry HEENT: Normocephalic, atraumatic. Pupils equal round and reactive  to light; sclera anicteric; extraocular muscles intact;  Nose without nasal septal hypertrophy Mouth/Parynx intubated Neck: No JVD, no carotid bruits; normal carotid upstroke Lungs: clear to ausculatation and percussion; no wheezing or rales Chest wall: without tenderness to palpitation Heart: PMI not displaced, RRR, s1 s2 normal, 1/6 systolic murmur, no diastolic murmur, no rubs, gallops, thrills, or heaves Abdomen: soft, nontender; no hepatosplenomehaly, BS+; abdominal aorta nontender and not dilated by palpation. Back: no CVA tenderness Pulses 2+ Musculoskeletal: full range of motion, normal strength, no joint deformities Extremities: no clubbing cyanosis or edema, Homan's sign negative  Neurologic: grossly nonfocal; Cranial nerves grossly wnl Psychologic: Normal mood  and affect      Labs    Chemistry Recent Labs  Lab 05/25/18 0409 05/26/18 0405 05/27/18 0307  NA 139 136 139  K 4.0 4.2 4.6  CL 93* 98 104  CO2 '31 27 27  '$ GLUCOSE 60* 120* 119*  BUN 39* 43* 37*  CREATININE 3.74* 3.84* 3.29*  CALCIUM 6.5* 6.9* 7.7*  PROT 4.7* 5.2* 5.2*  ALBUMIN 2.5* 2.6* 2.6*  AST 9,330* 4,085* 1,778*  ALT 6,238* 4,620* 3,440*  ALKPHOS 105 131* 221*  BILITOT 2.5* 2.1* 2.2*  GFRNONAA 17* 17* 20*  GFRAA 20* 19* 23*  ANIONGAP '15 11 8     '$ Hematology Recent Labs  Lab 05/25/18 0523 05/26/18 0405 05/27/18 0307  WBC 15.9* 12.1* 11.8*  RBC 3.78* 3.32* 3.27*  HGB 10.3* 9.0* 8.9*  HCT 31.1* 28.6* 28.2*  MCV 82.3 86.1 86.2  MCH 27.2 27.1 27.2  MCHC 33.1 31.5 31.6  RDW 18.9* 19.5* 19.2*  PLT 123* 77* 65*    Cardiac Enzymes Recent Labs  Lab 05/23/18 0439 05/23/18 0822 05/23/18 1206 05/23/18 2253  TROPONINI 2.05* 1.69* 1.74* 1.99*    Recent Labs  Lab 05/22/18 1736  TROPIPOC 0.11*     BNP Recent Labs  Lab 05/22/18 2027  BNP 276.5*     DDimer No results for input(s): DDIMER in the last 168 hours.   Lipid Panel     Component Value Date/Time   CHOL 125 05/22/2018 1745   TRIG 59 05/22/2018 2026   HDL 40 (L) 05/22/2018 1745   CHOLHDL 3.1 05/22/2018 1745   VLDL 11 05/22/2018 1745   LDLCALC 74 05/22/2018 1745    Radiology    Dg Chest Port 1 View  Result Date: 05/26/2018 CLINICAL DATA:  Endotracheal tube placement. EXAM: PORTABLE CHEST 1 VIEW COMPARISON:  Radiograph of May 24, 2018. FINDINGS: Stable cardiomediastinal silhouette. Endotracheal and nasogastric tubes are unchanged in position. Sternotomy wires are noted. No pneumothorax or pleural effusion is noted. Stable calcified left hilar lymph node is noted. Mild medial bibasilar opacities are noted which are improved compared to prior exam, suggesting improving atelectasis or infiltrates. Bony thorax is unremarkable. IMPRESSION: Improving bibasilar opacities compared to prior exam.  Stable support apparatus. Electronically Signed   By: Marijo Conception, M.D.   On: 05/26/2018 12:16    Cardiac Studies    History obtained from chart review.  52 year old married Caucasian male with a history of prior thymectomy for myasthenia gravis as well as cocaine abuse who apparently was found down this afternoon in front of his mailbox and had bystander CPR.  EMS was called.  He was shocked 4 times and received intravenous epinephrine.  He had return of spontaneous circulation in sinus rhythm and was brought emergently to [facility].  His initial EKG showed ST segment elevation in leads the 3 4 and 5 but  ultimately this improved somewhat.  His blood pressure remained stable.  He was intubated and sedated in the ER.  Head CT showed no bleed.  He is brought urgently to the Cath Lab for angiography potential intervention.       Post-Intervention Diagram          ------------------------------------------------------------------- ECHO Study Conclusions  - Left ventricle: The cavity size was normal. Systolic function was   severely reduced. The estimated ejection fraction was in the   range of 25% to 30%. Severe diffuse hypokinesis with regional   variations. There is akinesis of the apicalanterior and apical   myocardium. There is akinesis of the midanteroseptal myocardium.   There is severe hypokinesis of the inferoseptal myocardium.   Features are consistent with a pseudonormal left ventricular   filling pattern, with concomitant abnormal relaxation and   increased filling pressure (grade 2 diastolic dysfunction).   Doppler parameters are consistent with high ventricular filling   pressure. - Aortic valve: A bicuspid morphology cannot be excluded; mildly   thickened, mildly calcified leaflets. There was mild to moderate   regurgitation. Regurgitation pressure half-time: 718 ms. - Mitral valve: There was mild to moderate regurgitation. Valve   area by pressure  half-time: 2.39 cm^2. - Right ventricle: Systolic function was moderately reduced. - Atrial septum: There was increased thickness of the septum,   consistent with lipomatous hypertrophy. - Tricuspid valve: There was mild regurgitation. - Pulmonary arteries: PA peak pressure: 33 mm Hg (S). - Inferior vena cava: The vessel was dilated. The respirophasic   diameter changes were blunted (< 50%), consistent with elevated   central venous pressure. - Pericardium, extracardiac: A small, free-flowing pericardial   effusion was identified circumferential to the heart. The fluid   had no internal echoes.There was no evidence of hemodynamic   compromise.  Impressions:  - severe LV dysfunction with diffuse HK but apica/anteroseptal and   apical anterior AK. Recommend definity contrast study to rule out   apical thrombus.   Patient Profile     52 y.o. male with history of myasthenia gravis post thymus resection, MI several years ago, hypertension who presented to the hospital with cardiac arrest, found to have anterior ST elevation and is now status post LAD stent.   Assessment & Plan    1. Out of hospital cardiac arrest: 05/22/2018 2/2 LAD occlusion with Anterior STEMI; s/p DES stent to LAD. According to separated wife, pt had been c/o chest pain recently. Trop 2.15.  Drug screen + for cocaine. S/p hypothermia protocol, rewarmed.  No recurrent VT; off amiodarone.   2. Cardiogenic shock: resolved initially required high dose levo and epi; now both dcd with BP 112/70  3. Shock Liver:  AST > 10,000 -> 9330 -> 1778; ALT 8369 -> 6238 -> 3440.  Off statin.  4. Anoxic encepholopathy; Initial EEG abnormal; repeat improved; now with generalized background slowing, nonspecific.   5. Respiratory arrest; on vent; per CCM with possible aspiration pneumonia on Zosyn, Vanc dc'd  6. Acute kidney injury: Cr increased to 2.16 --> 3.74  contributed by shock, hypotension and contrast; now getting CRT. Cr  today 3.29  7. Hypokalemia;  Improved today to 4.6  8. Substance abuse;  + cocaine;  ETOH withdrawal  resolved    Signed, Troy Sine, MD, [facility] 05/27/2018, 9:25 AM

## 2018-05-27 NOTE — Progress Notes (Signed)
PULMONARY / CRITICAL CARE MEDICINE  Name: Howard Thompson MRN: [id number] DOB: May 06, 1966    ADMISSION DATE:  05/22/2018 CONSULTATION DATE:  05/22/2018  REFERRING MD: Dr. Quay Burow, MD      CHIEF COMPLAINT:  STEMI, Shock   HISTORY OF PRESENT ILLNESS:   Howard Thompson, 52 y.o. male admitted on 05/22/2018 s/p unwitnessed out of hospital cardiac arrest, VF, unknown downtime, past medical history of coronary artery disease, myasthenia gravis, hypertension.  Per the electronic medical record the patient received 5 defibrillations and given 300 mg amiodarone.  Upon arrival the patient was found to have a STEMI and was taken to the Cath Lab where a DES was placed and a 100% LAD lesion.  Post catheter patient was taken for therapeutic hypothermia to the intensive care unit.  Per the initial critical care documentation there was concern for posturing.  EKGs initially with a prolonged QT at 560, now improved.  He was initially placed in a c-collar, presenting head CT and CT cervical spine negative for acute abnormalities.   7/28 - Stopped hypothermia and started rewarming  7/30 - Started CRRT, on low dose NEpi, Right Femoral HD Catheter placement  7/31 - EEG diffuse slowing no epilepsy  8/1 - following basic commands  SUBJECTIVE:  No issues overnight.  Continuing to wean from norepinephrine.  Continuing to wean ventilator.  Great news is that he was able to follow commands last night.  This morning also following basic commands including wiggling toes blinking eyes and nodding head.  Care management discussed with nursing staff at bedside.  They have no current concerns.  Ventilator management discussed with respiratory therapy at bedside.  Family updated at bedside this morning.  Tolerating CRRT able to obtain negative balance.  VITAL SIGNS: BP 115/73   Pulse 76   Temp 97.7 F (36.5 C)   Resp 17   Ht 5\' 10"  (1.778 m)   Wt 172 lb 6.4 oz (78.2 kg)   SpO2 100%   BMI 24.74 kg/m    HEMODYNAMICS: CVP:  [10 mmHg-12 mmHg] 10 mmHg  VENTILATOR SETTINGS: Vent Mode: PRVC FiO2 (%):  [40 %] 40 % Set Rate:  [20 bmp] 20 bmp Vt Set:  [520 mL] 520 mL PEEP:  [8 cmH20-12 cmH20] 8 cmH20 Plateau Pressure:  [18 cmH20-26 cmH20] 22 cmH20  INTAKE / OUTPUT: I/O last 3 completed shifts: In: 3981.8 [I.V.:997.8; Other:50; NG/GT:2320; IV WCHENIDPO:242] Out: 3536 [Urine:12; RWERX:5400; Stool:140]  PHYSICAL EXAMINATION: General: Elderly male, intubated, on mechanical ventilation  Neuro: He is able to follow basic commands this morning including nodding head, wiggling toes closing eyes.  Appears to be neurologically intact postarrest HEENT: NCAT, sclera clear, pupils reactive.  No nystagmus, c-collar in place Cardiovascular: Regular rate and rhythm, S1-S2 Lungs: Bilateral ventilated breath sounds, no crackles Abdomen: Soft, nontender, mildly distended, bowel sounds are present, Arctic sun pads present Musculoskeletal: Arctic sun cooling pads present, no significant edema Skin: Cool toes, normal cap refill  LABS:  BMET Recent Labs  Lab 05/25/18 0409 05/26/18 0405 05/27/18 0307  NA 139 136 139  K 4.0 4.2 4.6  CL 93* 98 104  CO2 31 27 27   BUN 39* 43* 37*  CREATININE 3.74* 3.84* 3.29*  GLUCOSE 60* 120* 119*    Electrolytes Recent Labs  Lab 05/25/18 0409 05/26/18 0405 05/27/18 0307  CALCIUM 6.5* 6.9* 7.7*  MG 1.7 2.2 2.4  PHOS 4.3 3.7 2.4*    CBC Recent Labs  Lab 05/25/18 0523 05/26/18 0405 05/27/18 0307  WBC 15.9*  12.1* 11.8*  HGB 10.3* 9.0* 8.9*  HCT 31.1* 28.6* 28.2*  PLT 123* 77* 65*    Coag's Recent Labs  Lab 05/22/18 2027 05/23/18 0439 05/24/18 1010 05/25/18 0409  APTT 122* 59*  --  43*  INR 5.53* 1.55 3.87 2.25    Sepsis Markers Recent Labs  Lab 05/22/18 2027  05/23/18 2253 05/24/18 0412 05/24/18 1756  LATICACIDVEN 2.8*   < > 10.2* 11.8* 5.2*  PROCALCITON 0.14  --   --   --   --    < > = values in this interval not displayed.     ABG Recent Labs  Lab 05/25/18 1008 05/26/18 0516 05/27/18 0745  PHART 7.450 7.409 7.437  PCO2ART 46.7 41.9 39.4  PO2ART 130* 93.0 154.0*    Liver Enzymes Recent Labs  Lab 05/25/18 0409 05/26/18 0405 05/27/18 0307  AST 9,330* 4,085* 1,778*  ALT 6,238* 4,620* 3,440*  ALKPHOS 105 131* 221*  BILITOT 2.5* 2.1* 2.2*  ALBUMIN 2.5* 2.6* 2.6*    Cardiac Enzymes Recent Labs  Lab 05/23/18 0822 05/23/18 1206 05/23/18 2253  TROPONINI 1.69* 1.74* 1.99*    Glucose Recent Labs  Lab 05/26/18 0759 05/26/18 1144 05/26/18 1602 05/26/18 2305 05/27/18 0307 05/27/18 0757  GLUCAP 102* 106* 124* 89 110* 131*    Imaging Chest xray imaging reviewed by me. Bilateral hilar opacities, displaced left IJ CVC  STUDIES:  LHC 05/22/2018 by Dr. Harriet Butte LAD to Prox LAD lesion is 100% stenosed.  A drug-eluting stent was successfully placed using a STENT SYNERGY DES 3X20.  Post intervention, there is a 0% residual stenosis.  There is severe left ventricular systolic dysfunction.  LV end diastolic pressure is mildly elevated.  The left ventricular ejection fraction is less than 25% by visual estimate.  ECHO 05/23/2018: EF 32-95%, Grade 2 Diastolic dysfunction  Regional wall motion abnormalities   EKG 05/23/2018: OTc 560 >> 441 >> 441  CULTURES: 05/22/2018 Tracheal Aspirate  -  Few GPCs and GNRs - PENDING   ANTIBIOTICS: Pip/tazo: 05/23/2018 >>> Vancomycin: 05/23/2018 >>>  SIGNIFICANT EVENTS: 7/28 - Stopped external cooling  LINES/TUBES: Left IJ CVC   ASSESSMENT / PLAN:  1. Acute Hypoxemic Respiratory Failure s/p Cardiac Arrest, s/p 5 defibrillations, 300mg  Amiodarone with ROSC, CXR with bilateral hilar and basilar opacities, consistent with aspiration pneumonia, requiring mechanical ventilation  - I called and spoke with the lab regarding his respiratory culture. They are ruling out MRSA on a staph species.   - The GNRs on the plate have not continued to grow    -The micro biology of the results likely represent a polymicrobial aspiration event.  -We will continue vancomycin due to the concern for possible MRSA and Zosyn until cultures finalize  -Once the cultures finalized we can likely de-escalate to Unasyn  -Change ventilator settings: PRVC, 40% FiO2, drop PEEP to 8, tidal volume 520  -Potential candidate for SBT this afternoon, definite candidate for SBT tomorrow morning  2. Cardiogenic Shock, s/p STEMI with 100% LAD Occlusion with placement of DES, Acute Systolic and Diastolic Heart failure   -Continue to wean nor epi as tolerated to maintain map greater than 65  - I suspect he will remain on nor epi while on CRRT.  -Antiplatelets per cardiology, once liver injury resolves can likely restart statin   3. Acute renal failure, Lactic Acidosis, improving, oliguria, Mixed Acid base disorder     -CRRT per nephrology  4. Likely ICU induced and pressor induced gastritis, blood OGT suctioning  -  Continue Pepcid  5. Coagulopathy, elevated INR, likely related to Shock Liver, Elevated Liver enzymes  Chronic alcoholism, daily drinker, substance abuse history   -Continue metabolic resuscitation with vitamin supplementation  -Continue CRRT  -Precedex on board for sedation also abate issues with possible alcohol withdrawal  6. Prior prolonged QTc, now improved on most recent EKGs   -Avoid QT prolonging drugs  7. Neurologic status, acute metabolic encephalopathy, improving  -RASS goal of 0 to -1  -Light sedation with as needed fentanyl and Precedex  8. Drainage from the right nare, improved, no further issue 9.  Glycemic control, SSI 10. Displacement left IJ CVC, appearing to be turning upwards toward the right subclavian  -Pressors run through right femoral, should be able to remove left IJ soon. 11. Thrombocytopenia, multifactorial, sepsis, shock, unknown liver history with chronic ETOH use  -Continue to observe  DVT PPX: Subcu heparin,  SCDs  FAMILY  - Updates: I spoke with family at bedside again this morning.  They are very pleased with his progress.  The patient is critically ill with multiple organ systems failure and requires high complexity decision making for assessment and support, frequent evaluation and titration of therapies, application of advanced monitoring technologies and extensive interpretation of multiple databases. Critical Care Time devoted to patient care services described in this note is 45 minutes.  Garner Nash, DO  Pulmonary and [place] Pager: [phone]  05/27/2018, 8:11 AM

## 2018-05-27 NOTE — Progress Notes (Signed)
Patient ID: Howard Thompson, male   DOB: [birth year], 52 y.o.   MRN: [id number] Called with critical labs- potassium 7.3 (sample drawn from venous line) on patient who is on CRRT with 4K bath and no acute telemetry changes or other acute clinical events.  Plan: Recheck stat BMET from peripheral stick to decide on need for change in CRRT prescription.  Elmarie Shiley MD [facility]. Office # [phone] Pager # [phone] 6:11 PM

## 2018-05-27 NOTE — Progress Notes (Signed)
[facility] ROUNDING NOTE   Subjective:   Interval History: Remains intubated but now following basic commands.  CRRT running without issue, net neg ~2L over past 24h.   Objective:  Vital signs in last 24 hours:  Temp:  [97.5 F (36.4 C)-99 F (37.2 C)] 97.7 F (36.5 C) (08/01 0845) Pulse Rate:  [66-89] 68 (08/01 0845) Resp:  [13-26] 20 (08/01 0845) BP: (76-156)/(61-81) 115/73 (08/01 0800) SpO2:  [94 %-100 %] 99 % (08/01 0845) Arterial Line BP: (98-155)/(49-80) 103/51 (08/01 0845) FiO2 (%):  [40 %] 40 % (08/01 0747) Weight:  [78.2 kg (172 lb 6.4 oz)] 78.2 kg (172 lb 6.4 oz) (08/01 0400)  Weight change: -3.11 kg (-6 lb 13.7 oz) Filed Weights   05/25/18 0600 05/26/18 0500 05/27/18 0400  Weight: 81 kg (178 lb 9.2 oz) 81.3 kg (179 lb 4.1 oz) 78.2 kg (172 lb 6.4 oz)   Intake/Output: I/O last 3 completed shifts: In: 3981.8 [I.V.:997.8; Other:50; NG/GT:2320; IV HYIFOYDXA:128] Out: 7867 [Urine:12; EHMCN:4709; Stool:140]   Intake/Output this shift:  Total I/O In: 348.3 [I.V.:141.6; NG/GT:170; IV Piggyback:36.7] Out: 565 [Other:565]  Physical Exam: General- Intubated CVS- RRR, mrg's RS- CTA ABD- BS present soft non-distended EXT- trace generalized edema  Basic Metabolic Panel: Recent Labs  Lab 05/23/18 0439  05/23/18 2253  05/24/18 0620  05/24/18 1359 05/24/18 1942 05/25/18 0409 05/26/18 0405 05/27/18 0307  NA 142   < > 145   < > 144   < > 140 139 139 136 139  K 2.8*   < > 3.1*   < > 4.1   < > 3.6 3.7 4.0 4.2 4.6  CL 109   < > 108   < > 102   < > 96* 93* 93* 98 104  CO2 22   < > 21*   < > 20*   < > '28 29 31 27 27  '$ GLUCOSE 176*   < > 154*   < > 152*   < > 112* 95 60* 120* 119*  BUN 24*   < > 27*   < > 28*   < > 31* 34* 39* 43* 37*  CREATININE 1.14   < > 1.81*   < > 2.16*   < > 2.56* 3.02* 3.74* 3.84* 3.29*  CALCIUM 8.0*   < > 6.5*   < > 6.7*   < > 6.1* 6.1* 6.5* 6.9* 7.7*  MG 2.0  --  1.6*  --  2.4  --   --   --  1.7 2.2 2.4  PHOS 2.6  --   --   --   9.6*  --   --   --  4.3 3.7 2.4*   < > = values in this interval not displayed.    Liver Function Tests: Recent Labs  Lab 05/24/18 1010 05/25/18 0409 05/26/18 0405 05/27/18 0307  AST >10,000* 9,330* 4,085* 1,778*  ALT 8,869* 6,238* 4,620* 3,440*  ALKPHOS 107 105 131* 221*  BILITOT 1.4* 2.5* 2.1* 2.2*  PROT 4.2* 4.7* 5.2* 5.2*  ALBUMIN 2.4* 2.5* 2.6* 2.6*   No results for input(s): LIPASE, AMYLASE in the last 168 hours. No results for input(s): AMMONIA in the last 168 hours.  CBC: Recent Labs  Lab 05/23/18 2253 05/24/18 0620 05/25/18 0523 05/26/18 0405 05/27/18 0307  WBC 6.4 9.3 15.9* 12.1* 11.8*  NEUTROABS 5.7 8.9* 15.1* 11.2* 10.7*  HGB 11.2* 11.6* 10.3* 9.0* 8.9*  HCT 36.0* 37.8* 31.1* 28.6* 28.2*  MCV 87.8 88.5 82.3 86.1  86.2  PLT 142* 148* 123* 77* 65*    Cardiac Enzymes: Recent Labs  Lab 05/22/18 2027 05/23/18 0439 05/23/18 0822 05/23/18 1206 05/23/18 2253  TROPONINI 1.19* 2.05* 1.69* 1.74* 1.99*    BNP: Invalid input(s): POCBNP  CBG: Recent Labs  Lab 05/26/18 1948 05/26/18 2305 05/27/18 0307 05/27/18 0757 05/27/18 0941  GLUCAP 89 89 110* 131* 126*    Microbiology: Results for orders placed or performed during the hospital encounter of 05/22/18  MRSA PCR Screening     Status: None   Collection Time: 05/22/18  8:24 PM  Result Value Ref Range Status   MRSA by PCR NEGATIVE NEGATIVE Final    Comment:        The GeneXpert MRSA Assay (FDA approved for NASAL specimens only), is one component of a comprehensive MRSA colonization surveillance program. It is not intended to diagnose MRSA infection nor to guide or monitor treatment for MRSA infections. Performed at [facility], [place] [address], [place], [place] [address]   Urine culture     Status: None   Collection Time: 05/22/18  8:35 PM  Result Value Ref Range Status   Specimen Description URINE, RANDOM  Final   Special Requests NONE  Final   Culture   Final    NO  GROWTH Performed at [facility], [place] [address], [place], [place] [address]    Report Status 05/24/2018 FINAL  Final  Culture, respiratory (tracheal aspirate)     Status: None (Preliminary result)   Collection Time: 05/22/18  8:35 PM  Result Value Ref Range Status   Specimen Description TRACHEAL ASPIRATE  Final   Special Requests NONE  Final   Gram Stain   Final    FEW WBC PRESENT, PREDOMINANTLY PMN ABUNDANT GRAM POSITIVE COCCI IN CHAINS IN PAIRS FEW GRAM NEGATIVE RODS FEW YEAST    Culture   Final    FEW STAPHYLOCOCCUS AUREUS SUSCEPTIBILITIES TO FOLLOW Performed at [facility], [place] [address], [place], [place] [address]    Report Status PENDING  Incomplete  Culture, blood (routine x 2)     Status: None (Preliminary result)   Collection Time: 05/22/18  9:57 PM  Result Value Ref Range Status   Specimen Description BLOOD  Final   Special Requests   Final    FROM SHEATH BOTTLES DRAWN AEROBIC AND ANAEROBIC Blood Culture adequate volume   Culture   Final    NO GROWTH 4 DAYS Performed at [facility], [place] [address], [place], [place] [address]    Report Status PENDING  Incomplete  Culture, blood (routine x 2)     Status: None (Preliminary result)   Collection Time: 05/22/18 10:03 PM  Result Value Ref Range Status   Specimen Description BLOOD CENTRAL LINE  Final   Special Requests   Final    BOTTLES DRAWN AEROBIC AND ANAEROBIC Blood Culture adequate volume   Culture  Setup Time   Final    ANAEROBIC BOTTLE ONLY GRAM POSITIVE RODS CRITICAL RESULT CALLED TO, READ BACK BY AND VERIFIED WITH: V BRYK PHARMD 05/27/18 0739 JDW    Culture   Final    NO GROWTH 4 DAYS Performed at [facility], [place] [address], [place], [place] [address]    Report Status PENDING  Incomplete   Coagulation Studies: Recent Labs    05/25/18 0409  LABPROT 24.7*  INR 2.25   Urinalysis: No results for input(s): COLORURINE, LABSPEC, [place], GLUCOSEU, HGBUR, BILIRUBINUR, KETONESUR,  PROTEINUR, UROBILINOGEN, NITRITE, LEUKOCYTESUR in the last 72 hours.  Invalid input(s): APPERANCEUR   Imaging: Dg Chest Port 1 View  Result Date: 05/26/2018 CLINICAL DATA:  Endotracheal tube placement. EXAM: PORTABLE CHEST 1 VIEW COMPARISON:  Radiograph of May 24, 2018. FINDINGS: Stable cardiomediastinal silhouette. Endotracheal and nasogastric tubes are unchanged in position. Sternotomy wires are noted. No pneumothorax or pleural effusion is noted. Stable calcified left hilar lymph node is noted. Mild medial bibasilar opacities are noted which are improved compared to prior exam, suggesting improving atelectasis or infiltrates. Bony thorax is unremarkable. IMPRESSION: Improving bibasilar opacities compared to prior exam. Stable support apparatus. Electronically Signed   By: Marijo Conception, M.D.   On: 05/26/2018 12:16   Medications:   . sodium chloride Stopped (05/25/18 1135)  . sodium chloride Stopped (05/27/18 0551)  . sodium chloride Stopped (05/24/18 1724)  . sodium chloride    . dexmedetomidine (PRECEDEX) IV infusion 0.3 mcg/kg/hr (05/27/18 1036)  . dextrose Stopped (05/25/18 0931)  . feeding supplement (VITAL AF 1.2 CAL) 60 mL/hr at 05/27/18 0700  . norepinephrine (LEVOPHED) Adult infusion Stopped (05/27/18 0733)  . piperacillin-tazobactam (ZOSYN)  IV Stopped (05/27/18 0953)  . dialysis replacement fluid (prismasate) 500 mL/hr at 05/27/18 1020  . dialysis replacement fluid (prismasate) 500 mL/hr at 05/27/18 1011  . dialysate (PRISMASATE) 1,500 mL/hr at 05/27/18 1016  . sodium phosphate  Dextrose 5% IVPB 42 mL/hr at 05/27/18 1000  . vancomycin Stopped (05/26/18 1749)   . sodium chloride   Intravenous Once  . chlorhexidine gluconate (MEDLINE KIT)  15 mL Mouth Rinse BID  . Chlorhexidine Gluconate Cloth  6 each Topical Daily  . famotidine  20 mg Oral Daily  . folic acid  1 mg Oral Daily  . heparin  5,000 Units Subcutaneous Q8H  . hydrocortisone sod succinate (SOLU-CORTEF) inj  50  mg Intravenous Daily  . insulin aspart  0-9 Units Subcutaneous Q4H  . mouth rinse  15 mL Mouth Rinse 10 times per day  . sodium chloride flush  10-40 mL Intracatheter Q12H  . sodium chloride flush  3 mL Intravenous Q12H  . thiamine injection  100 mg Intravenous Daily  . ticagrelor  90 mg Per Tube BID  . zinc sulfate  220 mg Oral Daily   sodium chloride, sodium chloride, Place/Maintain arterial line **AND** sodium chloride, docusate, fentaNYL (SUBLIMAZE) injection, fentaNYL (SUBLIMAZE) injection, heparin, midazolam, midazolam, sodium chloride flush, sodium chloride flush   Patient Profile: Howard Thompson a 52 y.o.malew/ a PMHx notable CAD prior MI, HTN, myasthenia gravis s/p thymectomy, brought to the ED following sudden cardiac arrest with LHC finding 100% LAD occlusion prompting DES placement with good post procedural flow. Decreased renal output with increased volume in the setting of acidosis and increasing serum creatinine prompting ongoing consideration for CRRT.  Assessment/ Plan:  1.  Severe oliguric AKI:  Secondary to cardiogenic shock and resultant ATN.  Currently anuric.  Tolerating CRRT well.  Given hypervolemia continue FRR net neg 100/hr for now.  Alter as clinically appropriate.  BID lytes - OK.    2.  Anemia:  No ESA indicated with AKI.  Care per PCCM.    3.  Acidemia:  Metabolic, present when in shock, resolved.   Will continue to follow closely with you.  Page with questions, concerns. Jannifer Hick MD

## 2018-05-28 DIAGNOSIS — Z9289 Personal history of other medical treatment: Secondary | ICD-10-CM

## 2018-05-28 DIAGNOSIS — F1410 Cocaine abuse, uncomplicated: Secondary | ICD-10-CM

## 2018-05-28 DIAGNOSIS — D696 Thrombocytopenia, unspecified: Secondary | ICD-10-CM

## 2018-05-28 DIAGNOSIS — F1910 Other psychoactive substance abuse, uncomplicated: Secondary | ICD-10-CM

## 2018-05-28 LAB — HEPATIC FUNCTION PANEL
ALBUMIN: 2.5 g/dL — AB (ref 3.5–5.0)
ALT: 1854 U/L — ABNORMAL HIGH (ref 0–44)
AST: 593 U/L — ABNORMAL HIGH (ref 15–41)
Alkaline Phosphatase: 289 U/L — ABNORMAL HIGH (ref 38–126)
BILIRUBIN DIRECT: 1.1 mg/dL — AB (ref 0.0–0.2)
BILIRUBIN INDIRECT: 1.2 mg/dL — AB (ref 0.3–0.9)
Total Bilirubin: 2.3 mg/dL — ABNORMAL HIGH (ref 0.3–1.2)
Total Protein: 5.6 g/dL — ABNORMAL LOW (ref 6.5–8.1)

## 2018-05-28 LAB — BASIC METABOLIC PANEL
Anion gap: 9 (ref 5–15)
BUN: 39 mg/dL — AB (ref 6–20)
CALCIUM: 8.2 mg/dL — AB (ref 8.9–10.3)
CO2: 26 mmol/L (ref 22–32)
CREATININE: 2.98 mg/dL — AB (ref 0.61–1.24)
Chloride: 101 mmol/L (ref 98–111)
GFR calc non Af Amer: 23 mL/min — ABNORMAL LOW (ref >60)
GFR, EST AFRICAN AMERICAN: 26 mL/min — AB (ref >60)
Glucose, Bld: 131 mg/dL — ABNORMAL HIGH (ref 70–99)
Potassium: 4.3 mmol/L (ref 3.5–5.1)
Sodium: 136 mmol/L (ref 135–145)

## 2018-05-28 LAB — CBC WITH DIFFERENTIAL/PLATELET
Abs Immature Granulocytes: 0.1 10*3/uL (ref 0.0–0.1)
BASOS PCT: 0 %
Basophils Absolute: 0 10*3/uL (ref 0.0–0.1)
EOS ABS: 0 10*3/uL (ref 0.0–0.7)
EOS PCT: 0 %
HCT: 28.6 % — ABNORMAL LOW (ref 39.0–52.0)
Hemoglobin: 9 g/dL — ABNORMAL LOW (ref 13.0–17.0)
Immature Granulocytes: 1 %
LYMPHS PCT: 6 %
Lymphs Abs: 0.6 10*3/uL — ABNORMAL LOW (ref 0.7–4.0)
MCH: 27 pg (ref 26.0–34.0)
MCHC: 31.5 g/dL (ref 30.0–36.0)
MCV: 85.9 fL (ref 78.0–100.0)
MONO ABS: 0.7 10*3/uL (ref 0.1–1.0)
Monocytes Relative: 6 %
Neutro Abs: 8.9 10*3/uL — ABNORMAL HIGH (ref 1.7–7.7)
Neutrophils Relative %: 87 %
PLATELETS: 54 10*3/uL — AB (ref 150–400)
RBC: 3.33 MIL/uL — AB (ref 4.22–5.81)
RDW: 19.7 % — AB (ref 11.5–15.5)
WBC: 10.3 10*3/uL (ref 4.0–10.5)

## 2018-05-28 LAB — GLUCOSE, CAPILLARY
GLUCOSE-CAPILLARY: 126 mg/dL — AB (ref 70–99)
GLUCOSE-CAPILLARY: 120 mg/dL — AB (ref 70–99)
GLUCOSE-CAPILLARY: 92 mg/dL (ref 70–99)
Glucose-Capillary: 106 mg/dL — ABNORMAL HIGH (ref 70–99)
Glucose-Capillary: 118 mg/dL — ABNORMAL HIGH (ref 70–99)
Glucose-Capillary: 78 mg/dL (ref 70–99)
Glucose-Capillary: 80 mg/dL (ref 70–99)

## 2018-05-28 LAB — MAGNESIUM: MAGNESIUM: 2.5 mg/dL — AB (ref 1.7–2.4)

## 2018-05-28 LAB — CULTURE, RESPIRATORY W GRAM STAIN

## 2018-05-28 LAB — RENAL FUNCTION PANEL
Albumin: 2.7 g/dL — ABNORMAL LOW (ref 3.5–5.0)
Anion gap: 13 (ref 5–15)
BUN: 36 mg/dL — AB (ref 6–20)
CO2: 24 mmol/L (ref 22–32)
CREATININE: 2.56 mg/dL — AB (ref 0.61–1.24)
Calcium: 6.8 mg/dL — ABNORMAL LOW (ref 8.9–10.3)
Chloride: 102 mmol/L (ref 98–111)
GFR calc Af Amer: 32 mL/min — ABNORMAL LOW (ref >60)
GFR, EST NON AFRICAN AMERICAN: 27 mL/min — AB (ref >60)
Glucose, Bld: 87 mg/dL (ref 70–99)
Phosphorus: 1.7 mg/dL — ABNORMAL LOW (ref 2.5–4.6)
Potassium: 3.9 mmol/L (ref 3.5–5.1)
Sodium: 139 mmol/L (ref 135–145)

## 2018-05-28 LAB — CULTURE, RESPIRATORY

## 2018-05-28 LAB — TROPONIN I: Troponin I: 0.92 ng/mL (ref <0.03)

## 2018-05-28 LAB — PHOSPHORUS: PHOSPHORUS: 2.6 mg/dL (ref 2.5–4.6)

## 2018-05-28 MED ORDER — ORAL CARE MOUTH RINSE
15.0000 mL | Freq: Four times a day (QID) | OROMUCOSAL | Status: DC
  Administered 2018-05-28 (×2): 15 mL via OROMUCOSAL

## 2018-05-28 MED ORDER — ORAL CARE MOUTH RINSE
15.0000 mL | Freq: Two times a day (BID) | OROMUCOSAL | Status: DC
  Administered 2018-05-29 – 2018-06-07 (×10): 15 mL via OROMUCOSAL

## 2018-05-28 MED ORDER — FENTANYL CITRATE (PF) 100 MCG/2ML IJ SOLN
12.5000 ug | Freq: Once | INTRAMUSCULAR | Status: AC
  Administered 2018-05-28: 12.5 ug via INTRAVENOUS
  Filled 2018-05-28: qty 2

## 2018-05-28 MED ORDER — MORPHINE SULFATE (PF) 2 MG/ML IV SOLN
1.0000 mg | INTRAVENOUS | Status: DC | PRN
  Administered 2018-05-28 – 2018-05-29 (×2): 2 mg via INTRAVENOUS
  Filled 2018-05-28 (×2): qty 1

## 2018-05-28 MED ORDER — METOPROLOL TARTRATE 5 MG/5ML IV SOLN
2.5000 mg | Freq: Once | INTRAVENOUS | Status: AC
  Administered 2018-05-28: 2.5 mg via INTRAVENOUS
  Filled 2018-05-28: qty 5

## 2018-05-28 MED ORDER — CEFAZOLIN SODIUM-DEXTROSE 2-4 GM/100ML-% IV SOLN
2.0000 g | Freq: Two times a day (BID) | INTRAVENOUS | Status: AC
  Administered 2018-05-28 – 2018-05-29 (×3): 2 g via INTRAVENOUS
  Filled 2018-05-28 (×3): qty 100

## 2018-05-28 MED ORDER — NITROGLYCERIN 2 % TD OINT
0.5000 [in_us] | TOPICAL_OINTMENT | Freq: Four times a day (QID) | TRANSDERMAL | Status: AC
  Administered 2018-05-28 – 2018-05-30 (×7): 0.5 [in_us] via TOPICAL
  Filled 2018-05-28: qty 30

## 2018-05-28 MED ORDER — METOPROLOL TARTRATE 5 MG/5ML IV SOLN
2.5000 mg | Freq: Four times a day (QID) | INTRAVENOUS | Status: DC
  Administered 2018-05-28: 2.5 mg via INTRAVENOUS
  Filled 2018-05-28: qty 5

## 2018-05-28 MED ORDER — WHITE PETROLATUM EX OINT
TOPICAL_OINTMENT | CUTANEOUS | Status: AC
  Filled 2018-05-28: qty 28.35

## 2018-05-28 MED ORDER — NITROGLYCERIN 0.4 MG SL SUBL
0.4000 mg | SUBLINGUAL_TABLET | SUBLINGUAL | Status: DC | PRN
  Administered 2018-05-28 – 2018-06-06 (×4): 0.4 mg via SUBLINGUAL
  Filled 2018-05-28 (×3): qty 1

## 2018-05-28 MED ORDER — METOPROLOL TARTRATE 5 MG/5ML IV SOLN
5.0000 mg | Freq: Four times a day (QID) | INTRAVENOUS | Status: DC
Start: 2018-05-29 — End: 2018-05-31
  Administered 2018-05-29 – 2018-05-31 (×8): 5 mg via INTRAVENOUS
  Filled 2018-05-28 (×9): qty 5

## 2018-05-28 NOTE — Plan of Care (Signed)
  Problem: Clinical Measurements: Goal: Ability to maintain clinical measurements within normal limits will improve Outcome: Progressing Goal: Diagnostic test results will improve Outcome: Progressing Goal: Respiratory complications will improve Outcome: Progressing   

## 2018-05-28 NOTE — Procedures (Signed)
Extubation Procedure Note  Patient Details:   Name: Howard Thompson DOB: Jun 29, 1966 MRN: [id number]   Airway Documentation:    Vent end date: 05/28/18 Vent end time: 1132   Evaluation  O2 sats: stable throughout Complications: No apparent complications Patient did tolerate procedure well. Bilateral Breath Sounds: Rhonchi, Diminished   Yes pt able to vocalize with raspy voice and at times whisper.   Pt extubated per MD order. Pt was able to breathe around deflated cuff. Pt placed on 4L [place] and tolerating well. No stridor noted. Pt has strong,adequate cough. IS performed with 625-730mLx5.   Irineo [facility] 05/28/2018, 11:48 AM

## 2018-05-28 NOTE — Progress Notes (Signed)
[facility] ROUNDING NOTE   Subjective:   Interval History: Remains intubated but now awake and following commands.  CRRT running without issue, net neg ~1.8L over past 24h.  HIT panel sent. No bleeding or thrombosis noted.  Objective:  Vital signs in last 24 hours:  Temp:  [97 F (36.1 C)-98.2 F (36.8 C)] 97.9 F (36.6 C) (08/02 1015) Pulse Rate:  [68-102] 93 (08/02 1015) Resp:  [14-25] 15 (08/02 1015) BP: (107-154)/(69-100) 131/91 (08/02 1015) SpO2:  [100 %] 100 % (08/02 1015) Arterial Line BP: (97-108)/(80-94) 97/80 (08/01 1215) FiO2 (%):  [40 %] 40 % (08/02 0800) Weight:  [78.3 kg (172 lb 9.9 oz)] 78.3 kg (172 lb 9.9 oz) (08/02 0500)  Weight change: 0.1 kg (3.5 oz) Filed Weights   05/26/18 0500 05/27/18 0400 05/28/18 0500  Weight: 81.3 kg (179 lb 4.1 oz) 78.2 kg (172 lb 6.4 oz) 78.3 kg (172 lb 9.9 oz)   Intake/Output: I/O last 3 completed shifts: In: 3900.6 [P.O.:30; I.V.:681.9; Other:50; NG/GT:2340; IV Piggyback:798.8] Out: 6868 [Urine:8; Other:6720; Stool:140]   Intake/Output this shift:  Total I/O In: 284.4 [I.V.:49.6; NG/GT:210; IV Piggyback:24.9] Out: 702 [Other:702]  Physical Exam: General- Intubated CVS- RRR, mrg's Resp - coarse anteriorly ABD- BS present soft non-distended EXT- trace generalized edema  Basic Metabolic Panel: Recent Labs  Lab 05/24/18 0620  05/25/18 0409 05/26/18 0405 05/27/18 0307 05/27/18 1614 05/27/18 1837 05/28/18 0606  NA 144   < > 139 136 139 133* 138 136  K 4.1   < > 4.0 4.2 4.6 7.3* 4.8 4.3  CL 102   < > 93* 98 104 102 100 101  CO2 20*   < > _0 GLUCOSE 152*   < > 60* 120* 119* 122* 147* 131*  BUN 28*   < > 39* 43* 37* 38* 39* 39*  CREATININE 2.16*   < > 3.74* 3.84* 3.29* 3.14* 3.23* 2.98*  CALCIUM 6.7*   < > 6.5* 6.9* 7.7* 7.3* 8.0* 8.2*  MG 2.4  --  1.7 2.2 2.4  --   --  2.5*  PHOS 9.6*  --  4.3 3.7 2.4* 3.2  --  2.6   < > = values in this interval not displayed.    Liver Function  Tests: Recent Labs  Lab 05/24/18 1010 05/25/18 0409 05/26/18 0405 05/27/18 0307 05/27/18 1614 05/28/18 0606  AST >10,000* 9,330* 4,085* 1,778*  --  593*  ALT 8,869* 6,238* 4,620* 3,440*  --  1,854*  ALKPHOS 107 105 131* 221*  --  289*  BILITOT 1.4* 2.5* 2.1* 2.2*  --  2.3*  PROT 4.2* 4.7* 5.2* 5.2*  --  5.6*  ALBUMIN 2.4* 2.5* 2.6* 2.6* 2.4* 2.5*   No results for input(s): LIPASE, AMYLASE in the last 168 hours. No results for input(s): AMMONIA in the last 168 hours.  CBC: Recent Labs  Lab 05/24/18 0620 05/25/18 0523 05/26/18 0405 05/27/18 0307 05/28/18 0606  WBC 9.3 15.9* 12.1* 11.8* 10.3  NEUTROABS 8.9* 15.1* 11.2* 10.7* 8.9*  HGB 11.6* 10.3* 9.0* 8.9* 9.0*  HCT 37.8* 31.1* 28.6* 28.2* 28.6*  MCV 88.5 82.3 86.1 86.2 85.9  PLT 148* 123* 77* 65* 54*    Cardiac Enzymes: Recent Labs  Lab 05/22/18 2027 05/23/18 0439 05/23/18 0822 05/23/18 1206 05/23/18 2253  TROPONINI 1.19* 2.05* 1.69* 1.74* 1.99*    BNP: Invalid input(s): POCBNP  CBG: Recent Labs  Lab 05/27/18 1547 05/27/18 2022 05/27/18 2350 05/28/18 0349 05/28/18 0729  GLUCAP  126* 126* 106* 33* 67*    Microbiology: Results for orders placed or performed during the hospital encounter of 05/22/18  MRSA PCR Screening     Status: None   Collection Time: 05/22/18  8:24 PM  Result Value Ref Range Status   MRSA by PCR NEGATIVE NEGATIVE Final    Comment:        The GeneXpert MRSA Assay (FDA approved for NASAL specimens only), is one component of a comprehensive MRSA colonization surveillance program. It is not intended to diagnose MRSA infection nor to guide or monitor treatment for MRSA infections. Performed at [facility], [place] [address] [place], [place] [address]   Urine culture     Status: None   Collection Time: 05/22/18  8:35 PM  Result Value Ref Range Status   Specimen Description URINE, RANDOM  Final   Special Requests NONE  Final   Culture   Final    NO GROWTH Performed at  [facility], [place] [address] [place], [place] [address]    Report Status 05/24/2018 FINAL  Final  Culture, respiratory (tracheal aspirate)     Status: None   Collection Time: 05/22/18  8:35 PM  Result Value Ref Range Status   Specimen Description TRACHEAL ASPIRATE  Final   Special Requests NONE  Final   Gram Stain   Final    FEW WBC PRESENT, PREDOMINANTLY PMN ABUNDANT GRAM POSITIVE COCCI IN CHAINS IN PAIRS FEW GRAM NEGATIVE RODS FEW YEAST Performed at [facility], [place] [address] [place], [place] [address]    Culture FEW STAPHYLOCOCCUS AUREUS  Final   Report Status 05/28/2018 FINAL  Final   Organism ID, Bacteria STAPHYLOCOCCUS AUREUS  Final      Susceptibility   Staphylococcus aureus - MIC*    CIPROFLOXACIN <=0.5 SENSITIVE Sensitive     ERYTHROMYCIN <=0.25 SENSITIVE Sensitive     GENTAMICIN <=0.5 SENSITIVE Sensitive     OXACILLIN 0.5 SENSITIVE Sensitive     TETRACYCLINE <=1 SENSITIVE Sensitive     VANCOMYCIN <=0.5 SENSITIVE Sensitive     TRIMETH/SULFA <=10 SENSITIVE Sensitive     CLINDAMYCIN <=0.25 SENSITIVE Sensitive     RIFAMPIN <=0.5 SENSITIVE Sensitive     Inducible Clindamycin NEGATIVE Sensitive     * FEW STAPHYLOCOCCUS AUREUS  Culture, blood (routine x 2)     Status: None   Collection Time: 05/22/18  9:57 PM  Result Value Ref Range Status   Specimen Description BLOOD  Final   Special Requests   Final    FROM SHEATH BOTTLES DRAWN AEROBIC AND ANAEROBIC Blood Culture adequate volume   Culture   Final    NO GROWTH 5 DAYS Performed at [facility], [address] [place], [place] [address]    Report Status 05/27/2018 FINAL  Final  Culture, blood (routine x 2)     Status: None   Collection Time: 05/22/18 10:03 PM  Result Value Ref Range Status   Specimen Description BLOOD CENTRAL LINE  Final   Special Requests   Final    BOTTLES DRAWN AEROBIC AND ANAEROBIC Blood Culture adequate volume   Culture  Setup Time   Final    ANAEROBIC BOTTLE ONLY GRAM  POSITIVE RODS CRITICAL RESULT CALLED TO, READ BACK BY AND VERIFIED WITH: V BRYK PHARMD 05/27/18 0739 JDW    Culture   Final    NO GROWTH 5 DAYS Performed at [facility], [place] [address], [place],  [address]  Report Status 05/27/2018 FINAL  Final   Coagulation Studies: No results for input(s): LABPROT, INR in the last 72 hours. Urinalysis: No results for input(s): COLORURINE, LABSPEC, PHURINE, GLUCOSEU, HGBUR, BILIRUBINUR, KETONESUR, PROTEINUR, UROBILINOGEN, NITRITE, LEUKOCYTESUR in the last 72 hours.  Invalid input(s): APPERANCEUR   Imaging: Dg Chest Port 1 View  Result Date: 05/26/2018 CLINICAL DATA:  Endotracheal tube placement. EXAM: PORTABLE CHEST 1 VIEW COMPARISON:  Radiograph of May 24, 2018. FINDINGS: Stable cardiomediastinal silhouette. Endotracheal and nasogastric tubes are unchanged in position. Sternotomy wires are noted. No pneumothorax or pleural effusion is noted. Stable calcified left hilar lymph node is noted. Mild medial bibasilar opacities are noted which are improved compared to prior exam, suggesting improving atelectasis or infiltrates. Bony thorax is unremarkable. IMPRESSION: Improving bibasilar opacities compared to prior exam. Stable support apparatus. Electronically Signed   By: Marijo Conception, M.D.   On: 05/26/2018 12:16   Medications:   . sodium chloride 10 mL/hr at 05/27/18 1638  . sodium chloride 10 mL/hr at 05/28/18 1000  . sodium chloride 500 mL (05/27/18 1647)  . dexmedetomidine (PRECEDEX) IV infusion 0.4 mcg/kg/hr (05/28/18 1000)  . dextrose Stopped (05/25/18 0931)  . feeding supplement (VITAL AF 1.2 CAL) 1,500 mL (05/27/18 2215)  . norepinephrine (LEVOPHED) Adult infusion Stopped (05/27/18 0733)  . piperacillin-tazobactam (ZOSYN)  IV 12.5 mL/hr at 05/28/18 0900  . dialysis replacement fluid (prismasate) 500 mL/hr at 05/28/18 0821  . dialysis replacement fluid (prismasate) 500 mL/hr at 05/28/18 0811  . dialysate (PRISMASATE) 1,500 mL/hr  at 05/28/18 0814  . vancomycin Stopped (05/27/18 1738)   . sodium chloride   Intravenous Once  . B-complex with vitamin C  1 tablet Per Tube Daily  . chlorhexidine gluconate (MEDLINE KIT)  15 mL Mouth Rinse BID  . Chlorhexidine Gluconate Cloth  6 each Topical Daily  . famotidine  20 mg Oral Daily  . folic acid  1 mg Oral Daily  . heparin  5,000 Units Subcutaneous Q8H  . hydrocortisone sod succinate (SOLU-CORTEF) inj  50 mg Intravenous Daily  . insulin aspart  0-9 Units Subcutaneous Q4H  . mouth rinse  15 mL Mouth Rinse 10 times per day  . sodium chloride flush  10-40 mL Intracatheter Q12H  . sodium chloride flush  3 mL Intravenous Q12H  . thiamine injection  100 mg Intravenous Daily  . ticagrelor  90 mg Per Tube BID  . zinc sulfate  220 mg Oral Daily   sodium chloride, sodium chloride, docusate, fentaNYL (SUBLIMAZE) injection, fentaNYL (SUBLIMAZE) injection, heparin, midazolam, midazolam, sodium chloride flush, sodium chloride flush   Patient Profile: Howard Thompson a 52 y.o.malew/ a PMHx notable CAD prior MI, HTN, myasthenia gravis s/p thymectomy, brought to the ED following sudden cardiac arrest with LHC finding 100% LAD occlusion prompting DES placement with good post procedural flow. Decreased renal output with increased volume in the setting of acidosis and increasing serum creatinine prompting ongoing consideration for CRRT.  Assessment/ Plan:  1.  Severe oliguric AKI:  Secondary to cardiogenic shock and resultant ATN.  Currently anuric.  Tolerating CRRT well.  Given hypervolemia continue FRR net neg increase to 150/hr for now.  We can hold CRRT whenever needed -- I think he'll tolerate iHD at this point; consider when extubated.  Once C spine cleared move HD line up to chest.  BID lytes - OK.    2.  Anemia:  No ESA indicated with AKI.  Care per PCCM.    3.  Acidemia:  Metabolic, present  when in shock, resolved.   4.  Thrombocytopenia:  HIT panel pending.  We are not running  heparin with CRRT but do have heparin catheter dwell ordered.  D/w pharmacy re: alt (citrate v argatropan per formulary).   Will continue to follow closely with you.  Page with questions, concerns. Jannifer Hick MD

## 2018-05-28 NOTE — Procedures (Signed)
I evaluated the patient during CRRT and made appropriate adjustments.   FRR 110mL/hr (net) -- increase to 113mL/hr.  Electrolytes ok.    Jannifer Hick MD

## 2018-05-28 NOTE — Progress Notes (Signed)
PT Cancellation Note  Patient Details Name: Howard Thompson MRN: [id number] DOB: 1966/06/29   Cancelled Treatment:    Reason Eval/Treat Not Completed: Medical issues which prohibited therapy(pt just extubated at 1132, remains on CRRT with temp fem HD cath. Will hold)   Kenan Moodie B Rox Mcgriff 05/28/2018, 12:31 PM Elwyn Reach, [place]

## 2018-05-28 NOTE — Progress Notes (Signed)
Patient extubated and C-collar removed per Dr. Valeta Harms.

## 2018-05-28 NOTE — Progress Notes (Signed)
Progress Note  Patient Name: Howard Thompson Date of Encounter: 05/28/2018  Primary Cardiologist: new; Dr. Gwenlyn Found  Subjective   Intubated on vent;  Awake, following commands.   Inpatient Medications    Scheduled Meds: . sodium chloride   Intravenous Once  . B-complex with vitamin C  1 tablet Per Tube Daily  . chlorhexidine gluconate (MEDLINE KIT)  15 mL Mouth Rinse BID  . Chlorhexidine Gluconate Cloth  6 each Topical Daily  . famotidine  20 mg Oral Daily  . folic acid  1 mg Oral Daily  . heparin  5,000 Units Subcutaneous Q8H  . hydrocortisone sod succinate (SOLU-CORTEF) inj  50 mg Intravenous Daily  . insulin aspart  0-9 Units Subcutaneous Q4H  . mouth rinse  15 mL Mouth Rinse 10 times per day  . sodium chloride flush  10-40 mL Intracatheter Q12H  . sodium chloride flush  3 mL Intravenous Q12H  . thiamine injection  100 mg Intravenous Daily  . ticagrelor  90 mg Per Tube BID  . zinc sulfate  220 mg Oral Daily   Continuous Infusions: . sodium chloride 10 mL/hr at 05/27/18 1638  . sodium chloride 10 mL/hr at 05/28/18 0900  . sodium chloride 500 mL (05/27/18 1647)  . dexmedetomidine (PRECEDEX) IV infusion 0.2 mcg/kg/hr (05/28/18 0900)  . dextrose Stopped (05/25/18 0931)  . feeding supplement (VITAL AF 1.2 CAL) 1,500 mL (05/27/18 2215)  . norepinephrine (LEVOPHED) Adult infusion Stopped (05/27/18 0733)  . piperacillin-tazobactam (ZOSYN)  IV 12.5 mL/hr at 05/28/18 0900  . dialysis replacement fluid (prismasate) 500 mL/hr at 05/28/18 0821  . dialysis replacement fluid (prismasate) 500 mL/hr at 05/28/18 0811  . dialysate (PRISMASATE) 1,500 mL/hr at 05/28/18 0814  . vancomycin Stopped (05/27/18 1738)   PRN Meds: sodium chloride, sodium chloride, docusate, fentaNYL (SUBLIMAZE) injection, fentaNYL (SUBLIMAZE) injection, heparin, midazolam, midazolam, sodium chloride flush, sodium chloride flush   Vital Signs    Vitals:   05/28/18 0900 05/28/18 0915 05/28/18 0930 05/28/18 0945    BP: 110/69 107/72 (!) 147/80   Pulse: 77 78 (!) 102 87  Resp: '15 16 20 17  '$ Temp: 97.7 F (36.5 C) 97.7 F (36.5 C) 97.7 F (36.5 C) 97.9 F (36.6 C)  TempSrc:      SpO2: 100% 100% 100% 100%  Weight:      Height:        Intake/Output Summary (Last 24 hours) at 05/28/2018 1000 Last data filed at 05/28/2018 0900 Gross per 24 hour  Intake 2553.35 ml  Output 4067 ml  Net -1513.65 ml    I/O since admission: +7770  Filed Weights   05/26/18 0500 05/27/18 0400 05/28/18 0500  Weight: 179 lb 4.1 oz (81.3 kg) 172 lb 6.4 oz (78.2 kg) 172 lb 9.9 oz (78.3 kg)    Telemetry    Sinus at 70 - Personally Reviewed  ECG    ECG (independently read by me): NSR 60; No significant ST changes; QTc 471  05/23/18 ECG (independently read by me): NSR at 74; QS V1-2; QTc 441 msec  Physical Exam    BP (!) 147/80   Pulse 87   Temp 97.9 F (36.6 C)   Resp 17   Ht '5\' 10"'$  (1.778 m)   Wt 172 lb 9.9 oz (78.3 kg)   SpO2 100%   BMI 24.77 kg/m  General: Alert, on vent; nods to y/n questions Skin: normal turgor, no rashes, warm and dry HEENT: Normocephalic, atraumatic. Pupils equal round and reactive to light; sclera anicteric;  extraocular muscles intact;  Nose without nasal septal hypertrophy Mouth/Parynx intubated Neck: No JVD, no carotid bruits; normal carotid upstroke Lungs: clear to ausculatation and percussion; no wheezing or rales Chest wall: without tenderness to palpitation Heart: PMI not displaced, RRR, s1 s2 normal, 1/6 systolic murmur, no diastolic murmur, no rubs, gallops, thrills, or heaves Abdomen: soft, nontender; no hepatosplenomehaly, BS+; abdominal aorta nontender and not dilated by palpation. Back: no CVA tenderness Pulses 2+ Musculoskeletal: full range of motion, normal strength, no joint deformities Extremities: no clubbing cyanosis or edema, Homan's sign negative  Neurologic: grossly nonfocal; Cranial nerves grossly wnl      Labs    Chemistry Recent Labs  Lab  05/26/18 0405 05/27/18 0307 05/27/18 1614 05/27/18 1837 05/28/18 0606  NA 136 139 133* 138 136  K 4.2 4.6 7.3* 4.8 4.3  CL 98 104 102 100 101  CO2 '27 27 25 25 26  '$ GLUCOSE 120* 119* 122* 147* 131*  BUN 43* 37* 38* 39* 39*  CREATININE 3.84* 3.29* 3.14* 3.23* 2.98*  CALCIUM 6.9* 7.7* 7.3* 8.0* 8.2*  PROT 5.2* 5.2*  --   --  5.6*  ALBUMIN 2.6* 2.6* 2.4*  --  2.5*  AST 4,085* 1,778*  --   --  593*  ALT 4,620* 3,440*  --   --  1,854*  ALKPHOS 131* 221*  --   --  289*  BILITOT 2.1* 2.2*  --   --  2.3*  GFRNONAA 17* 20* 21* 21* 23*  GFRAA 19* 23* 25* 24* 26*  ANIONGAP '11 8 6 13 9     '$ Hematology Recent Labs  Lab 05/26/18 0405 05/27/18 0307 05/28/18 0606  WBC 12.1* 11.8* 10.3  RBC 3.32* 3.27* 3.33*  HGB 9.0* 8.9* 9.0*  HCT 28.6* 28.2* 28.6*  MCV 86.1 86.2 85.9  MCH 27.1 27.2 27.0  MCHC 31.5 31.6 31.5  RDW 19.5* 19.2* 19.7*  PLT 77* 65* 54*    Cardiac Enzymes Recent Labs  Lab 05/23/18 0439 05/23/18 0822 05/23/18 1206 05/23/18 2253  TROPONINI 2.05* 1.69* 1.74* 1.99*    Recent Labs  Lab 05/22/18 1736  TROPIPOC 0.11*     BNP Recent Labs  Lab 05/22/18 2027  BNP 276.5*     DDimer No results for input(s): DDIMER in the last 168 hours.   Lipid Panel     Component Value Date/Time   CHOL 125 05/22/2018 1745   TRIG 59 05/22/2018 2026   HDL 40 (L) 05/22/2018 1745   CHOLHDL 3.1 05/22/2018 1745   VLDL 11 05/22/2018 1745   LDLCALC 74 05/22/2018 1745    Radiology    Dg Chest Port 1 View  Result Date: 05/26/2018 CLINICAL DATA:  Endotracheal tube placement. EXAM: PORTABLE CHEST 1 VIEW COMPARISON:  Radiograph of May 24, 2018. FINDINGS: Stable cardiomediastinal silhouette. Endotracheal and nasogastric tubes are unchanged in position. Sternotomy wires are noted. No pneumothorax or pleural effusion is noted. Stable calcified left hilar lymph node is noted. Mild medial bibasilar opacities are noted which are improved compared to prior exam, suggesting improving  atelectasis or infiltrates. Bony thorax is unremarkable. IMPRESSION: Improving bibasilar opacities compared to prior exam. Stable support apparatus. Electronically Signed   By: Marijo Conception, M.D.   On: 05/26/2018 12:16    Cardiac Studies    History obtained from chart review.  52 year old married Caucasian male with a history of prior thymectomy for myasthenia gravis as well as cocaine abuse who apparently was found down this afternoon in front of his mailbox and  had bystander CPR.  EMS was called.  He was shocked 4 times and received intravenous epinephrine.  He had return of spontaneous circulation in sinus rhythm and was brought emergently to [facility].  His initial EKG showed ST segment elevation in leads the 3 4 and 5 but ultimately this improved somewhat.  His blood pressure remained stable.  He was intubated and sedated in the ER.  Head CT showed no bleed.  He is brought urgently to the Cath Lab for angiography potential intervention.       Post-Intervention Diagram          ------------------------------------------------------------------- ECHO Study Conclusions  - Left ventricle: The cavity size was normal. Systolic function was   severely reduced. The estimated ejection fraction was in the   range of 25% to 30%. Severe diffuse hypokinesis with regional   variations. There is akinesis of the apicalanterior and apical   myocardium. There is akinesis of the midanteroseptal myocardium.   There is severe hypokinesis of the inferoseptal myocardium.   Features are consistent with a pseudonormal left ventricular   filling pattern, with concomitant abnormal relaxation and   increased filling pressure (grade 2 diastolic dysfunction).   Doppler parameters are consistent with high ventricular filling   pressure. - Aortic valve: A bicuspid morphology cannot be excluded; mildly   thickened, mildly calcified leaflets. There was mild to moderate   regurgitation.  Regurgitation pressure half-time: 718 ms. - Mitral valve: There was mild to moderate regurgitation. Valve   area by pressure half-time: 2.39 cm^2. - Right ventricle: Systolic function was moderately reduced. - Atrial septum: There was increased thickness of the septum,   consistent with lipomatous hypertrophy. - Tricuspid valve: There was mild regurgitation. - Pulmonary arteries: PA peak pressure: 33 mm Hg (S). - Inferior vena cava: The vessel was dilated. The respirophasic   diameter changes were blunted (< 50%), consistent with elevated   central venous pressure. - Pericardium, extracardiac: A small, free-flowing pericardial   effusion was identified circumferential to the heart. The fluid   had no internal echoes.There was no evidence of hemodynamic   compromise.  Impressions:  - severe LV dysfunction with diffuse HK but apica/anteroseptal and   apical anterior AK. Recommend definity contrast study to rule out   apical thrombus.   Patient Profile     52 y.o. male with history of myasthenia gravis post thymus resection, MI several years ago, hypertension who presented to the hospital with cardiac arrest, found to have anterior ST elevation and is now status post LAD stent.   Assessment & Plan    1. Out of hospital cardiac arrest: 05/22/2018 2/2 LAD occlusion with Anterior STEMI; s/p DES stent to LAD. According to separated wife, pt had been c/o chest pain recently. Trop 2.15.  Drug screen + for cocaine. S/p hypothermia protocol, rewarmed.  No recurrent VT; off amiodarone.   2. Cardiogenic shock: resolved initially required high dose levo and epi; now both dcd with BP 130/90  3. Shock Liver:  AST > 10,000 -> 9330 -> 1778 -> 593; ALT 8369 -> 6238 -> 3440 -> 1854; off statin.   4. Anoxic encepholopathy; Initial EEG abnormal; repeat improved; now with generalized background slowing, nonspecific. More alert today  5. Respiratory arrest; on vent; per CCM with possible aspiration  pneumonia on Zosyn and  Vanc.  Weaning,  Now on CPAP pressure support.   6. Progressive thrombocytopenia  Plt today further decreased to 54K; no obvious bleeding.  On brilinta post  stent, hesitant to stop with stent 7/27;  ?HIT  Consider hematology evaluation   7. Acute kidney injury: Cr increased to 2.16 --> 3.74  contributed by shock, hypotension and contrast;  getting CRT;  Cr today 2.98   7. Hypokalemia;  Improved today to 4.3  8. Substance abuse;  + cocaine;  ETOH withdrawal  Resolved;  Will need lifestyle adjustment    Signed, Troy Sine, MD, [facility] 05/28/2018, 10:00 AM

## 2018-05-28 NOTE — Progress Notes (Signed)
PULMONARY / CRITICAL CARE MEDICINE  Name: Howard Thompson MRN: [id number] DOB: October 20, 1966    ADMISSION DATE:  05/22/2018 CONSULTATION DATE:  05/22/2018  REFERRING MD: Dr. Quay Burow, MD      CHIEF COMPLAINT:  STEMI, Shock   HISTORY OF PRESENT ILLNESS:   Howard Thompson, 52 y.o. male admitted on 05/22/2018 s/p unwitnessed out of hospital cardiac arrest, VF, unknown downtime, past medical history of coronary artery disease, myasthenia gravis, hypertension.  Per the electronic medical record the patient received 5 defibrillations and given 300 mg amiodarone.  Upon arrival the patient was found to have a STEMI and was taken to the Cath Lab where a DES was placed and a 100% LAD lesion.  Post catheter patient was taken for therapeutic hypothermia to the intensive care unit.  Per the initial critical care documentation there was concern for posturing.  EKGs initially with a prolonged QT at 560, now improved.  He was initially placed in a c-collar, presenting head CT and CT cervical spine negative for acute abnormalities.   7/28 - Stopped hypothermia and started rewarming  7/30 - Started CRRT, on low dose NEpi, Right Femoral HD Catheter placement  7/31 - EEG diffuse slowing no epilepsy  8/1 - following basic commands 8/2 - extubation   SUBJECTIVE:  No issues overnight.  Awake alert following commands this morning.  Has done well with SBT and SAT.  Passing all weaning parameters.  Able to nod yes and no appropriately to pain.  Denies pain in his neck removal of c-collar and motion of head left to right move without any pain.  CT neck negative on admission.  Orders were given to remove c-collar.  Family at bedside very thankful for care that they have received thus far.  At bedside for extubation assessment.  And discussion with family.  VITAL SIGNS: BP (!) 141/87   Pulse 96   Temp 97.9 F (36.6 C)   Resp 17   Ht 5\' 10"  (1.778 m)   Wt 172 lb 9.9 oz (78.3 kg)   SpO2 99%   BMI 24.77 kg/m    HEMODYNAMICS: CVP:  [6 mmHg-15 mmHg] 14 mmHg  VENTILATOR SETTINGS: Vent Mode: CPAP;PSV FiO2 (%):  [40 %] 40 % Set Rate:  [20 bmp] 20 bmp Vt Set:  [520 mL] 520 mL PEEP:  [8 cmH20] 8 cmH20 Pressure Support:  [5 cmH20] 5 cmH20 Plateau Pressure:  [13 cmH20-19 cmH20] 16 cmH20  INTAKE / OUTPUT: I/O last 3 completed shifts: In: 3900.6 [P.O.:30; I.V.:681.9; Other:50; NG/GT:2340; IV Piggyback:798.8] Out: 6868 [Urine:8; Other:6720; Stool:140]  PHYSICAL EXAMINATION:  General: Elderly male, intubated, on mechanical ventilation however examined again post extubation able to phonate and communicate appropriately with no trouble.   Neuro: Following commands, 2 thumbs, able to lift head off bed HEENT: C-collar removed, no pain with palpation of the neck soft tissue but no JVD Cardiovascular: Regular rate and rhythm, no S1-S2 Lungs: Clear to auscultation bilaterally, no crackles, wheeze Abdomen: Soft, nontender, nondistended, bowel sounds present Musculoskeletal: Radial DP present Skin: Normal cap refill, no obvious rash.  LABS:  BMET Recent Labs  Lab 05/27/18 1614 05/27/18 1837 05/28/18 0606  NA 133* 138 136  K 7.3* 4.8 4.3  CL 102 100 101  CO2 25 25 26   BUN 38* 39* 39*  CREATININE 3.14* 3.23* 2.98*  GLUCOSE 122* 147* 131*    Electrolytes Recent Labs  Lab 05/26/18 0405 05/27/18 0307 05/27/18 1614 05/27/18 1837 05/28/18 0606  CALCIUM 6.9* 7.7* 7.3* 8.0* 8.2*  MG 2.2 2.4  --   --  2.5*  PHOS 3.7 2.4* 3.2  --  2.6    CBC Recent Labs  Lab 05/26/18 0405 05/27/18 0307 05/28/18 0606  WBC 12.1* 11.8* 10.3  HGB 9.0* 8.9* 9.0*  HCT 28.6* 28.2* 28.6*  PLT 77* 65* 54*    Coag's Recent Labs  Lab 05/22/18 2027 05/23/18 0439 05/24/18 1010 05/25/18 0409  APTT 122* 59*  --  43*  INR 5.53* 1.55 3.87 2.25    Sepsis Markers Recent Labs  Lab 05/22/18 2027  05/23/18 2253 05/24/18 0412 05/24/18 1756  LATICACIDVEN 2.8*   < > 10.2* 11.8* 5.2*  PROCALCITON 0.14  --    --   --   --    < > = values in this interval not displayed.    ABG Recent Labs  Lab 05/25/18 1008 05/26/18 0516 05/27/18 0745  PHART 7.450 7.409 7.437  PCO2ART 46.7 41.9 39.4  PO2ART 130* 93.0 154.0*    Liver Enzymes Recent Labs  Lab 05/26/18 0405 05/27/18 0307 05/27/18 1614 05/28/18 0606  AST 4,085* 1,778*  --  593*  ALT 4,620* 3,440*  --  1,854*  ALKPHOS 131* 221*  --  289*  BILITOT 2.1* 2.2*  --  2.3*  ALBUMIN 2.6* 2.6* 2.4* 2.5*    Cardiac Enzymes Recent Labs  Lab 05/23/18 0822 05/23/18 1206 05/23/18 2253  TROPONINI 1.69* 1.74* 1.99*    Glucose Recent Labs  Lab 05/27/18 1547 05/27/18 2022 05/27/18 2350 05/28/18 0349 05/28/18 0729 05/28/18 1151  GLUCAP 126* 126* 106* 126* 118* 120*    Imaging Chest xray imaging reviewed by me. Bilateral hilar opacities, displaced left IJ CVC  STUDIES:  LHC 05/22/2018 by Dr. Harriet Butte LAD to Prox LAD lesion is 100% stenosed.  A drug-eluting stent was successfully placed using a STENT SYNERGY DES 3X20.  Post intervention, there is a 0% residual stenosis.  There is severe left ventricular systolic dysfunction.  LV end diastolic pressure is mildly elevated.  The left ventricular ejection fraction is less than 25% by visual estimate.  ECHO 05/23/2018: EF 18-29%, Grade 2 Diastolic dysfunction  Regional wall motion abnormalities   EKG 05/23/2018: OTc 560 >> 441 >> 441  CULTURES: 05/22/2018 Tracheal Aspirate  -  Few GPCs and GNRs - PENDING   ANTIBIOTICS: Pip/tazo: 05/23/2018 >>> Vancomycin: 05/23/2018 >>>  SIGNIFICANT EVENTS: 7/28 - Stopped external cooling  LINES/TUBES: Left IJ CVC   ASSESSMENT / PLAN:  1. Acute Hypoxemic Respiratory Failure s/p Cardiac Arrest, s/p 5 defibrillations, 300mg  Amiodarone with ROSC, CXR with bilateral hilar and basilar opacities, consistent with staphylococcal pneumonia , requiring mechanical ventilation, now status post extubation  -Sputum cultures with staph  aureus.  -De-escalate antibiotics to cefazolin  -Passed SBT and SAT well this morning, following commands pulling large tidal volumes on 0/5 pressure support  -Plan for extubation today should remain in the intensive care unit for close monitoring of airway postextubation for a minimum of the next 24 hours however will likely remain in the ICU as long as CRRT needs are present  2. Cardiogenic Shock, s/p STEMI with 100% LAD Occlusion with placement of DES, Acute Systolic and Diastolic Heart failure, improving  -Once liver injury recovers can likely restart statin   -CRRT managing fluid status with negative balance  -Per nursing they plan to increase pull   3. Acute renal failure, Lactic Acidosis, improving, oliguria, Mixed Acid base disorder     -CRRT management per nephrology  -We appreciate nephrology's  recommendations and management  4.  GI prophylaxis  -Continue Pepcid  5. Coagulopathy, elevated INR, likely related to Shock Liver, Elevated Liver enzymes  Chronic alcoholism, daily drinker, substance abuse history   -Continue vitamin supplementation  -We will need counseling on substance abuse  -Transaminitis is improving  6. Prior prolonged QTc, now improved on most recent EKGs   -Avoid QT prolonging agents  7.  Metabolic encephalopathy, cleared  8. Drainage from the right nare, improved 9.  Glycemic control, SSI 10. Displacement left IJ CVC, appearing to be turning upwards toward the right subclavian  -Can likely remove left IJ soon, consider removal tomorrow 11. Thrombocytopenia, multifactorial, sepsis, shock, unknown liver history with chronic ETOH use  -Discussed with nursing and pharmacy  -Cardiology and pharmacy have made recommendations for sending off a HIT panel  -I think this is most likely related to his ICU stay and not HIT however is important to rule out  DVT PPX: Heparin, SCDs  FAMILY  - Updates: Multiple family members at bedside.  The patient is critically  ill with multiple organ systems failure and requires high complexity decision making for assessment and support, frequent evaluation and titration of therapies, application of advanced monitoring technologies and extensive interpretation of multiple databases. Critical Care Time devoted to patient care services described in this note is 35 minutes.   Garner Nash, DO [facility] 05/28/2018 1:04 PM  Pager: [phone]

## 2018-05-28 NOTE — Plan of Care (Signed)
  Problem: Clinical Measurements: Goal: Ability to maintain clinical measurements within normal limits will improve Outcome: Progressing Goal: Respiratory complications will improve Outcome: Progressing Goal: Cardiovascular complication will be avoided Outcome: Progressing   

## 2018-05-28 NOTE — Progress Notes (Addendum)
[facility] Progress Note Patient Name: Howard Thompson DOB: 10/07/1966 MRN: [id number]   Date of Service  05/28/2018  HPI/Events of Note  Patient with retrosternal/ ? Epigastric discomfort which is intermittent, no dyspeptic symptoms, pain is partially reproducible but pt not sure both pains are the same. Pt has strong CAD history, no diaphoresis or radiation of pain, BP elevated at 161/93  eICU Interventions  EKG, Troponin x 3, SL Nitroglycerine, iv Morphine if Nitro fails to resolve pain, Lopressor 2.5 mg iv Q 6 hrs prn, patient is NPO pending speech evaluation and cannot receive oral antacid for possible heartburn. EKG: Unremarkable. Troponin: 0.92 which may be residual from recent STEMI/ PCI Will increase Lopressor to 5 mg iv Q 6 hrs and add Nitropaste 0.5 inch Q 6 Hrs        Howard Thompson U Loyce Klasen 05/28/2018, 8:41 PM

## 2018-05-29 ENCOUNTER — Inpatient Hospital Stay (HOSPITAL_COMMUNITY): Payer: No Typology Code available for payment source

## 2018-05-29 DIAGNOSIS — N170 Acute kidney failure with tubular necrosis: Secondary | ICD-10-CM

## 2018-05-29 DIAGNOSIS — J9600 Acute respiratory failure, unspecified whether with hypoxia or hypercapnia: Secondary | ICD-10-CM

## 2018-05-29 LAB — CBC WITH DIFFERENTIAL/PLATELET
Abs Immature Granulocytes: 0.2 10*3/uL — ABNORMAL HIGH (ref 0.0–0.1)
BASOS ABS: 0 10*3/uL (ref 0.0–0.1)
Basophils Relative: 0 %
EOS PCT: 0 %
Eosinophils Absolute: 0 10*3/uL (ref 0.0–0.7)
HEMATOCRIT: 27.9 % — AB (ref 39.0–52.0)
HEMOGLOBIN: 8.9 g/dL — AB (ref 13.0–17.0)
Immature Granulocytes: 2 %
LYMPHS ABS: 0.4 10*3/uL — AB (ref 0.7–4.0)
LYMPHS PCT: 4 %
MCH: 27.6 pg (ref 26.0–34.0)
MCHC: 31.9 g/dL (ref 30.0–36.0)
MCV: 86.4 fL (ref 78.0–100.0)
MONO ABS: 0.8 10*3/uL (ref 0.1–1.0)
MONOS PCT: 9 %
Neutro Abs: 7.9 10*3/uL — ABNORMAL HIGH (ref 1.7–7.7)
Neutrophils Relative %: 85 %
Platelets: 73 10*3/uL — ABNORMAL LOW (ref 150–400)
RBC: 3.23 MIL/uL — ABNORMAL LOW (ref 4.22–5.81)
RDW: 19.9 % — ABNORMAL HIGH (ref 11.5–15.5)
WBC: 9.3 10*3/uL (ref 4.0–10.5)

## 2018-05-29 LAB — BASIC METABOLIC PANEL
ANION GAP: 8 (ref 5–15)
BUN: 35 mg/dL — ABNORMAL HIGH (ref 6–20)
CALCIUM: 8 mg/dL — AB (ref 8.9–10.3)
CO2: 27 mmol/L (ref 22–32)
Chloride: 104 mmol/L (ref 98–111)
Creatinine, Ser: 2.61 mg/dL — ABNORMAL HIGH (ref 0.61–1.24)
GFR, EST AFRICAN AMERICAN: 31 mL/min — AB (ref >60)
GFR, EST NON AFRICAN AMERICAN: 27 mL/min — AB (ref >60)
Glucose, Bld: 88 mg/dL (ref 70–99)
Potassium: 5.1 mmol/L (ref 3.5–5.1)
Sodium: 139 mmol/L (ref 135–145)

## 2018-05-29 LAB — HEPARIN INDUCED PLATELET AB (HIT ANTIBODY): HEPARIN INDUCED PLT AB: 0.239 {OD_unit} (ref 0.000–0.400)

## 2018-05-29 LAB — GLUCOSE, CAPILLARY
GLUCOSE-CAPILLARY: 85 mg/dL (ref 70–99)
Glucose-Capillary: 92 mg/dL (ref 70–99)
Glucose-Capillary: 80 mg/dL (ref 70–99)
Glucose-Capillary: 80 mg/dL (ref 70–99)
Glucose-Capillary: 81 mg/dL (ref 70–99)

## 2018-05-29 LAB — POCT I-STAT 3, ART BLOOD GAS (G3+)
ACID-BASE EXCESS: 1 mmol/L (ref 0.0–2.0)
BICARBONATE: 24.9 mmol/L (ref 20.0–28.0)
O2 Saturation: 97 %
PH ART: 7.446 (ref 7.350–7.450)
TCO2: 26 mmol/L (ref 22–32)
pCO2 arterial: 36.2 mmHg (ref 32.0–48.0)
pO2, Arterial: 85 mmHg (ref 83.0–108.0)

## 2018-05-29 LAB — MAGNESIUM: MAGNESIUM: 2.5 mg/dL — AB (ref 1.7–2.4)

## 2018-05-29 LAB — TROPONIN I
TROPONIN I: 0.66 ng/mL — AB (ref <0.03)
Troponin I: 0.87 ng/mL (ref <0.03)

## 2018-05-29 LAB — HEPATIC FUNCTION PANEL
ALBUMIN: 2.4 g/dL — AB (ref 3.5–5.0)
ALT: 1326 U/L — ABNORMAL HIGH (ref 0–44)
AST: 263 U/L — AB (ref 15–41)
Alkaline Phosphatase: 226 U/L — ABNORMAL HIGH (ref 38–126)
Bilirubin, Direct: 0.8 mg/dL — ABNORMAL HIGH (ref 0.0–0.2)
Indirect Bilirubin: 0.9 mg/dL (ref 0.3–0.9)
Total Bilirubin: 1.7 mg/dL — ABNORMAL HIGH (ref 0.3–1.2)
Total Protein: 5.4 g/dL — ABNORMAL LOW (ref 6.5–8.1)

## 2018-05-29 LAB — PHOSPHORUS: Phosphorus: 2.5 mg/dL (ref 2.5–4.6)

## 2018-05-29 MED ORDER — FOLIC ACID 1 MG PO TABS: 1.0000 mg | ORAL_TABLET | Freq: Every day | ORAL | Status: DC

## 2018-05-29 MED ORDER — HYDRALAZINE HCL 20 MG/ML IJ SOLN
10.0000 mg | INTRAMUSCULAR | Status: DC | PRN
  Administered 2018-05-30 – 2018-06-02 (×3): 20 mg via INTRAVENOUS
  Administered 2018-06-07: 15 mg via INTRAVENOUS
  Filled 2018-05-29 (×5): qty 1

## 2018-05-29 MED ORDER — HALOPERIDOL LACTATE 5 MG/ML IJ SOLN: 1.0000 mg | Freq: Four times a day (QID) | INTRAMUSCULAR | Status: DC | PRN

## 2018-05-29 MED ORDER — ADULT MULTIVITAMIN W/MINERALS CH
1.0000 | ORAL_TABLET | Freq: Every day | ORAL | Status: DC
Start: 2018-05-29 — End: 2018-06-08
  Administered 2018-05-31 – 2018-06-07 (×6): 1 via ORAL
  Filled 2018-05-29 (×10): qty 1

## 2018-05-29 MED ORDER — DEXMEDETOMIDINE HCL IN NACL 400 MCG/100ML IV SOLN
0.4000 ug/kg/h | INTRAVENOUS | Status: DC
  Administered 2018-05-29: 0.7 ug/kg/h via INTRAVENOUS
  Administered 2018-05-29: 0.9 ug/kg/h via INTRAVENOUS
  Administered 2018-05-30: 0.4 ug/kg/h via INTRAVENOUS
  Administered 2018-05-31: 0.7 ug/kg/h via INTRAVENOUS
  Administered 2018-05-31: 0.4 ug/kg/h via INTRAVENOUS
  Administered 2018-05-31: 0.6 ug/kg/h via INTRAVENOUS
  Administered 2018-06-01: 1.2 ug/kg/h via INTRAVENOUS
  Filled 2018-05-29 (×7): qty 100

## 2018-05-29 MED ORDER — METOPROLOL TARTRATE 5 MG/5ML IV SOLN
10.0000 mg | Freq: Once | INTRAVENOUS | Status: AC
  Administered 2018-05-29: 10 mg via INTRAVENOUS
  Filled 2018-05-29: qty 10

## 2018-05-29 MED ORDER — HALOPERIDOL LACTATE 5 MG/ML IJ SOLN
0.5000 mg | Freq: Four times a day (QID) | INTRAMUSCULAR | Status: DC | PRN
  Administered 2018-05-29: 1 mg via INTRAVENOUS
  Filled 2018-05-29 (×2): qty 1

## 2018-05-29 MED ORDER — LORAZEPAM 2 MG/ML IJ SOLN
1.0000 mg | INTRAMUSCULAR | Status: DC | PRN
  Administered 2018-05-29 – 2018-06-13 (×10): 2 mg via INTRAVENOUS
  Filled 2018-05-29 (×12): qty 1

## 2018-05-29 MED ORDER — DEXMEDETOMIDINE HCL IN NACL 400 MCG/100ML IV SOLN: 0.2000 ug/kg/h | INTRAVENOUS | Status: DC

## 2018-05-29 MED ORDER — VITAMIN B-1 100 MG PO TABS
100.0000 mg | ORAL_TABLET | Freq: Every day | ORAL | Status: DC
  Administered 2018-05-31 – 2018-06-02 (×3): 100 mg via ORAL
  Filled 2018-05-29 (×7): qty 1

## 2018-05-29 NOTE — Progress Notes (Addendum)
[facility] Progress Note Patient Name: Howard Thompson DOB: Mar 22, 1966 MRN: [id number]   Date of Service  05/29/2018  HPI/Events of Note  Persistent agitation despite Haldol and Precedex. On further questioning family admits to heavy daily ETOH abuse prior to hospitalization. Patient with low Saturation likely secondary to post-extubation atelectasis linked to inability to participate effectively in lung expansion Rx  eICU Interventions  CIWA protocol with Ativan ordered, discontinue Haldol, low level CPAP for lung expansion/ alveolar recruitment        [name] 05/29/2018, 1:56 AM

## 2018-05-29 NOTE — Progress Notes (Signed)
[place] Progress Note Patient Name: Howard Thompson DOB: [birth year] MRN: [id number]   Date of Service  05/29/2018  HPI/Events of Note  Agitation not relieved by Haldol, ? Chest discomfort  eICU Interventions  Precedex infusion, EKG, cycle Troponins        [name] 05/29/2018, 12:42 AM

## 2018-05-29 NOTE — Progress Notes (Signed)
[facility] ROUNDING NOTE   Subjective:   Interval History: Extubated yesterday, agitated overnight now on precedex gtt again. HIT panel sent yesterday. No bleeding or thrombosis noted.  Plt improved to 73 today.  Net neg 2.2 yesterday. UOP 28mL  Objective:  Vital signs in last 24 hours:  Temp:  [97.5 F (36.4 C)-98.8 F (37.1 C)] 97.7 F (36.5 C) (08/03 0800) Pulse Rate:  [61-109] 64 (08/03 0833) Resp:  [14-30] 21 (08/03 0833) BP: (86-183)/(57-102) 106/72 (08/03 0800) SpO2:  [90 %-100 %] 97 % (08/03 0833) FiO2 (%):  [55 %] 55 % (08/03 0115) Weight:  [71.7 kg (158 lb 1.1 oz)] 71.7 kg (158 lb 1.1 oz) (08/03 0400)  Weight change: -6.6 kg (-14 lb 8.8 oz) Filed Weights   05/27/18 0400 05/28/18 0500 05/29/18 0400  Weight: 78.2 kg (172 lb 6.4 oz) 78.3 kg (172 lb 9.9 oz) 71.7 kg (158 lb 1.1 oz)   Intake/Output: I/O last 3 completed shifts: In: 1820.7 [I.V.:562.5; NG/GT:1050; IV Piggyback:208.2] Out: 0272 [Urine:40; ZDGUY:4034; Stool:100]   Intake/Output this shift:  Total I/O In: 133.8 [I.V.:22.6; IV Piggyback:111.2] Out: 53 [Urine:5; Other:48]  Physical Exam: General- Intubated CVS- RRR, mrg's Resp - coarse anteriorly ABD- BS present soft non-distended EXT- trace generalized edema  Basic Metabolic Panel: Recent Labs  Lab 05/25/18 0409 05/26/18 0405 05/27/18 0307 05/27/18 1614 05/27/18 1837 05/28/18 0606 05/28/18 2028 05/29/18 0333  NA 139 136 139 133* 138 136 139 139  K 4.0 4.2 4.6 7.3* 4.8 4.3 3.9 5.1  CL 93* 98 104 102 100 101 102 104  CO2 31 27 27 25 25 26 24 27   GLUCOSE 60* 120* 119* 122* 147* 131* 87 88  BUN 39* 43* 37* 38* 39* 39* 36* 35*  CREATININE 3.74* 3.84* 3.29* 3.14* 3.23* 2.98* 2.56* 2.61*  CALCIUM 6.5* 6.9* 7.7* 7.3* 8.0* 8.2* 6.8* 8.0*  MG 1.7 2.2 2.4  --   --  2.5*  --  2.5*  PHOS 4.3 3.7 2.4* 3.2  --  2.6 1.7* 2.5    Liver Function Tests: Recent Labs  Lab 05/25/18 0409 05/26/18 0405 05/27/18 0307 05/27/18 1614  05/28/18 0606 05/28/18 2028 05/29/18 0333  AST 9,330* 4,085* 1,778*  --  593*  --  263*  ALT 6,238* 4,620* 3,440*  --  1,854*  --  1,326*  ALKPHOS 105 131* 221*  --  289*  --  226*  BILITOT 2.5* 2.1* 2.2*  --  2.3*  --  1.7*  PROT 4.7* 5.2* 5.2*  --  5.6*  --  5.4*  ALBUMIN 2.5* 2.6* 2.6* 2.4* 2.5* 2.7* 2.4*   No results for input(s): LIPASE, AMYLASE in the last 168 hours. No results for input(s): AMMONIA in the last 168 hours.  CBC: Recent Labs  Lab 05/25/18 0523 05/26/18 0405 05/27/18 0307 05/28/18 0606 05/29/18 0333  WBC 15.9* 12.1* 11.8* 10.3 9.3  NEUTROABS 15.1* 11.2* 10.7* 8.9* 7.9*  HGB 10.3* 9.0* 8.9* 9.0* 8.9*  HCT 31.1* 28.6* 28.2* 28.6* 27.9*  MCV 82.3 86.1 86.2 85.9 86.4  PLT 123* 77* 65* 54* 73*    Cardiac Enzymes: Recent Labs  Lab 05/23/18 0822 05/23/18 1206 05/23/18 2253 05/28/18 2027 05/29/18 0333  TROPONINI 1.69* 1.74* 1.99* 0.92* 0.87*    BNP: Invalid input(s): POCBNP  CBG: Recent Labs  Lab 05/28/18 1558 05/28/18 2111 05/28/18 2346 05/29/18 0349 05/29/18 0726  GLUCAP 92 80 78 92 80    Microbiology: Results for orders placed or performed during the hospital encounter of 05/22/18  MRSA PCR Screening     Status: None   Collection Time: 05/22/18  8:24 PM  Result Value Ref Range Status   MRSA by PCR NEGATIVE NEGATIVE Final    Comment:        The GeneXpert MRSA Assay (FDA approved for NASAL specimens only), is one component of a comprehensive MRSA colonization surveillance program. It is not intended to diagnose MRSA infection nor to guide or monitor treatment for MRSA infections. Performed at [facility], [place] [address], [place], [place] [address]   Urine culture     Status: None   Collection Time: 05/22/18  8:35 PM  Result Value Ref Range Status   Specimen Description URINE, RANDOM  Final   Special Requests NONE  Final   Culture   Final    NO GROWTH Performed at [facility], [place] [address], [place],  [place] [address]    Report Status 05/24/2018 FINAL  Final  Culture, respiratory (tracheal aspirate)     Status: None   Collection Time: 05/22/18  8:35 PM  Result Value Ref Range Status   Specimen Description TRACHEAL ASPIRATE  Final   Special Requests NONE  Final   Gram Stain   Final    FEW WBC PRESENT, PREDOMINANTLY PMN ABUNDANT GRAM POSITIVE COCCI IN CHAINS IN PAIRS FEW GRAM NEGATIVE RODS FEW YEAST Performed at [facility], [place] [address], [place], [place] [address]    Culture FEW STAPHYLOCOCCUS AUREUS  Final   Report Status 05/28/2018 FINAL  Final   Organism ID, Bacteria STAPHYLOCOCCUS AUREUS  Final      Susceptibility   Staphylococcus aureus - MIC*    CIPROFLOXACIN <=0.5 SENSITIVE Sensitive     ERYTHROMYCIN <=0.25 SENSITIVE Sensitive     GENTAMICIN <=0.5 SENSITIVE Sensitive     OXACILLIN 0.5 SENSITIVE Sensitive     TETRACYCLINE <=1 SENSITIVE Sensitive     VANCOMYCIN <=0.5 SENSITIVE Sensitive     TRIMETH/SULFA <=10 SENSITIVE Sensitive     CLINDAMYCIN <=0.25 SENSITIVE Sensitive     RIFAMPIN <=0.5 SENSITIVE Sensitive     Inducible Clindamycin NEGATIVE Sensitive     * FEW STAPHYLOCOCCUS AUREUS  Culture, blood (routine x 2)     Status: None   Collection Time: 05/22/18  9:57 PM  Result Value Ref Range Status   Specimen Description BLOOD  Final   Special Requests   Final    FROM SHEATH BOTTLES DRAWN AEROBIC AND ANAEROBIC Blood Culture adequate volume   Culture   Final    NO GROWTH 5 DAYS Performed at [facility], [address] [place], [place] [address]    Report Status 05/27/2018 FINAL  Final  Culture, blood (routine x 2)     Status: None (Preliminary result)   Collection Time: 05/22/18 10:03 PM  Result Value Ref Range Status   Specimen Description BLOOD CENTRAL LINE  Final   Special Requests   Final    BOTTLES DRAWN AEROBIC AND ANAEROBIC Blood Culture adequate volume   Culture  Setup Time   Final    ANAEROBIC BOTTLE ONLY GRAM POSITIVE RODS CRITICAL RESULT  CALLED TO, READ BACK BY AND VERIFIED WITHAlona Bene [facility] 05/27/18 0962 JDW Performed at [facility], [place] [address] [place], [place] [address]    Culture   Final    GRAM POSITIVE RODS CORRECTED ON 08/03 AT 9476: PREVIOUSLY REPORTED AS NO GROWTH 5 DAYS   Report Status PENDING  Incomplete   Coagulation Studies:  No results for input(s): LABPROT, INR in the last 72 hours. Urinalysis: No results for input(s): COLORURINE, LABSPEC, PHURINE, GLUCOSEU, HGBUR, BILIRUBINUR, KETONESUR, PROTEINUR, UROBILINOGEN, NITRITE, LEUKOCYTESUR in the last 72 hours.  Invalid input(s): APPERANCEUR   Imaging: Dg Chest Port 1 View  Result Date: 05/29/2018 CLINICAL DATA:  Acute respiratory failure. EXAM: PORTABLE CHEST 1 VIEW COMPARISON:  05/26/2018 FINDINGS: Endotracheal tube and NG tube is been removed in the interval. The left IJ central line remains in place, crossing the midline and extending cranially towards the thoracic inlet, likely in the right innominate vein directed cranially. Cardiopericardial silhouette is at upper limits of normal for size. Interval progression of bilateral airspace disease with central predominance. The visualized bony structures of the thorax are intact. Telemetry leads overlie the chest. IMPRESSION: 1. Interval progression of central airspace disease suggests edema. 2. Left IJ central line crosses the midline is directed cranially on the right, overlying the expected location of the right innominate vein. Electronically Signed   By: Misty Stanley M.D.   On: 05/29/2018 02:59   Dg Abd Portable 1v  Result Date: 05/29/2018 CLINICAL DATA:  Pain. EXAM: PORTABLE ABDOMEN - 1 VIEW COMPARISON:  None. FINDINGS: Right femoral catheter terminates at the level of the sacrum. No bowel dilatation to suggest obstruction. Slight increased air throughout central small bowel without abnormal distension. No evidence of free air. No radiopaque calculi. Ill-defined patchy bibasilar opacities. Incidental  hemi transitional lumbosacral anatomy. IMPRESSION: Nonobstructive bowel gas pattern. Slight increased small bowel gas suggests mild ileus. Electronically Signed   By: Jeb Levering M.D.   On: 05/29/2018 02:37   Medications:   . sodium chloride 10 mL/hr at 05/27/18 1638  . sodium chloride 10 mL/hr at 05/29/18 0800  . sodium chloride 500 mL (05/27/18 1647)  .  ceFAZolin (ANCEF) IV Stopped (05/28/18 1826)  . dexmedetomidine (PRECEDEX) IV infusion 0.5 mcg/kg/hr (05/29/18 0800)  . dextrose Stopped (05/25/18 0931)  . feeding supplement (VITAL AF 1.2 CAL) Stopped (05/28/18 1130)  . norepinephrine (LEVOPHED) Adult infusion Stopped (05/27/18 0733)  . dialysis replacement fluid (prismasate) 500 mL/hr at 05/29/18 0544  . dialysis replacement fluid (prismasate) 500 mL/hr at 05/29/18 0605  . dialysate (PRISMASATE) 1,500 mL/hr at 05/29/18 0550   . sodium chloride   Intravenous Once  . B-complex with vitamin C  1 tablet Per Tube Daily  . Chlorhexidine Gluconate Cloth  6 each Topical Daily  . famotidine  20 mg Oral Daily  . folic acid  1 mg Oral Daily  . heparin  5,000 Units Subcutaneous Q8H  . hydrocortisone sod succinate (SOLU-CORTEF) inj  50 mg Intravenous Daily  . insulin aspart  0-9 Units Subcutaneous Q4H  . mouth rinse  15 mL Mouth Rinse BID  . metoprolol tartrate  5 mg Intravenous Q6H  . multivitamin with minerals  1 tablet Oral Daily  . nitroGLYCERIN  0.5 inch Topical Q6H  . sodium chloride flush  10-40 mL Intracatheter Q12H  . sodium chloride flush  3 mL Intravenous Q12H  . thiamine  100 mg Oral Daily  . ticagrelor  90 mg Per Tube BID  . zinc sulfate  220 mg Oral Daily   sodium chloride, sodium chloride, docusate, heparin, LORazepam, morphine injection, nitroGLYCERIN, sodium chloride flush, sodium chloride flush   Patient Profile: Rosemary Pentecost a 52 y.o.malew/ a PMHx notable CAD prior MI, HTN, myasthenia gravis s/p thymectomy, brought to the ED following sudden cardiac arrest  with LHC finding 100% LAD occlusion prompting DES placement with good post  procedural flow. Decreased renal output with increased volume in the setting of acidosis and increasing serum creatinine prompting ongoing consideration for CRRT.  Assessment/ Plan:  1.  Severe oliguric AKI:  Secondary to cardiogenic shock and resultant ATN.  Currently ~anuric.  Tolerating CRRT well.  CVPs not accurate with catheter tip malpositioned. He likely is approaching euvolemia.  Continue FRR 75/hr (reduced compared to yest AM) and decrease if needed through day.  Hold CRRT in the next 12-24hrs if pressures hold, will likely need iHD for a period until he recovers renal function.  C spine cleared now, so can move HD line up to chest when able per CCM.  BID lytes - OK.    2.  Anemia:  No ESA indicated with AKI.  Care per PCCM.    3.  Acidemia:  Metabolic, present when in shock, resolved.   4.  Thrombocytopenia:  HIT panel pending.  We are not running heparin with CRRT but do have heparin catheter dwell ordered.    Will continue to follow closely with you.  Page with questions, concerns. Jannifer Hick MD

## 2018-05-29 NOTE — Evaluation (Signed)
Clinical/Bedside Swallow Evaluation Patient Details  Name: Howard Thompson MRN: [id number] Date of Birth: 26-Sep-1966  Today's Date: 05/29/2018 Time: SLP Start Time (ACUTE ONLY): 3614 SLP Stop Time (ACUTE ONLY): 1328 SLP Time Calculation (min) (ACUTE ONLY): 26 min  Past Medical History: History reviewed. No pertinent past medical history. Past Surgical History:  Past Surgical History:  Procedure Laterality Date  . CORONARY STENT INTERVENTION N/A 05/22/2018   Procedure: CORONARY STENT INTERVENTION;  Surgeon: Lorretta Harp, MD;  Location: [facility];  Service: Cardiovascular;  Laterality: N/A;  . LEFT HEART CATH AND CORONARY ANGIOGRAPHY N/A 05/22/2018   Procedure: LEFT HEART CATH AND CORONARY ANGIOGRAPHY;  Surgeon: Lorretta Harp, MD;  Location: [facility];  Service: Cardiovascular;  Laterality: N/A;   HPI:  Howard Thompson is a 52 y.o. male admitted on 7/27 s/p unwitnessed out of hospital cardiac arrest, VF, unknown downtime, past medical history of coronary artery disease, myasthenia gravis, hypertension.  Per wife, pt has had hx of dysphagia related to MG which is largely resolved.  Pt was intubated from 7/27 - 8/2.   Assessment / Plan / Recommendation Clinical Impression  Pt had very poor oral acceptance of bolus trials.  Pt took very small amounts of thin liquid and puree. Pt would not accept regular solid bolus trial.  With maximum encourangement, pt took fair-average size bolus of puree by spoon.  Pt tolerated all consistencies trialed with no overt s/s of aspiraiton; however, a concern for prandial aspiration still exists given prolonged intubation.  Recommemnd puree diet with thin liquid with MBSS to follow for further assessment of pharyngeal swallow function. SLP Visit Diagnosis: Dysphagia, oropharyngeal phase (R13.12)    Aspiration Risk  Moderate aspiration risk;Mild aspiration risk    Diet Recommendation Dysphagia 1 (Puree);Thin liquid   Liquid Administration via:  Cup;Straw Medication Administration: Crushed with puree Supervision: Staff to assist with self feeding Compensations: Minimize environmental distractions;Slow rate;Small sips/bites Postural Changes: Seated upright at 90 degrees    Other  Recommendations Oral Care Recommendations: Oral care BID       Prognosis Prognosis for Safe Diet Advancement: Good      Swallow Study   General Date of Onset: 05/22/18 HPI: Howard Thompson is a 52 y.o. male admitted on 7/27 s/p unwitnessed out of hospital cardiac arrest, VF, unknown downtime, past medical history of coronary artery disease, myasthenia gravis, hypertension.  Per wife, pt has had hx of dysphagia related to MG which is largely resolved.  Pt was intubated from 7/27 - 8/2. Type of Study: Bedside Swallow Evaluation Diet Prior to this Study: NPO Temperature Spikes Noted: No Respiratory Status: Nasal cannula History of Recent Intubation: Yes Length of Intubations (days): 7 days Date extubated: 05/28/18 Behavior/Cognition: Alert;Cooperative;Requires cueing;Distractible Oral Cavity Assessment: Within Functional Limits Oral Cavity - Dentition: Adequate natural dentition Self-Feeding Abilities: Total assist Patient Positioning: Upright in bed Baseline Vocal Quality: Breathy;Low vocal intensity(able to achieve seemingly normal phonation with effort) Volitional Cough: Weak Volitional Swallow: Able to elicit    Oral/Motor/Sensory Function Overall Oral Motor/Sensory Function: Mild impairment Facial ROM: Within Functional Limits Facial Symmetry: Within Functional Limits Lingual ROM: Within Functional Limits Lingual Symmetry: Within Functional Limits Lingual Strength: Reduced   Ice Chips Ice chips: Not tested   Thin Liquid Thin Liquid: Within functional limits Presentation: Spoon;Straw Other Comments: Poor acceptance    Nectar Thick Nectar Thick Liquid: Not tested   Honey Thick Honey Thick Liquid: Not tested   Puree Puree: Within functional  limits Presentation: Spoon Other  Comments: poor acceptance   Solid     Solid: Not tested Other Comments: Unable to test. Poor acceptance.      Howard Thompson Howard Thompson 05/29/2018,1:50 PM

## 2018-05-29 NOTE — Progress Notes (Signed)
Progress Note  Patient Name: Howard Thompson Date of Encounter: 05/29/2018  Primary Cardiologist: No primary care provider on file.   Subjective   Awake, tearful, holding my hand.  Sentimental.  Confused.  Inpatient Medications    Scheduled Meds: . sodium chloride   Intravenous Once  . B-complex with vitamin C  1 tablet Per Tube Daily  . Chlorhexidine Gluconate Cloth  6 each Topical Daily  . famotidine  20 mg Oral Daily  . folic acid  1 mg Oral Daily  . heparin  5,000 Units Subcutaneous Q8H  . hydrocortisone sod succinate (SOLU-CORTEF) inj  50 mg Intravenous Daily  . insulin aspart  0-9 Units Subcutaneous Q4H  . mouth rinse  15 mL Mouth Rinse BID  . metoprolol tartrate  5 mg Intravenous Q6H  . multivitamin with minerals  1 tablet Oral Daily  . nitroGLYCERIN  0.5 inch Topical Q6H  . sodium chloride flush  10-40 mL Intracatheter Q12H  . sodium chloride flush  3 mL Intravenous Q12H  . thiamine  100 mg Oral Daily  . ticagrelor  90 mg Per Tube BID  . zinc sulfate  220 mg Oral Daily   Continuous Infusions: . sodium chloride 10 mL/hr at 05/27/18 1638  . sodium chloride 10 mL/hr at 05/29/18 1000  . sodium chloride 500 mL (05/27/18 1647)  .  ceFAZolin (ANCEF) IV 2 g (05/29/18 1008)  . dexmedetomidine (PRECEDEX) IV infusion 0.2 mcg/kg/hr (05/29/18 0821)  . dextrose Stopped (05/25/18 0931)  . feeding supplement (VITAL AF 1.2 CAL) Stopped (05/28/18 1130)  . norepinephrine (LEVOPHED) Adult infusion Stopped (05/27/18 0733)  . dialysis replacement fluid (prismasate) 500 mL/hr at 05/29/18 0544  . dialysis replacement fluid (prismasate) 500 mL/hr at 05/29/18 0605  . dialysate (PRISMASATE) 1,500 mL/hr at 05/29/18 0920   PRN Meds: sodium chloride, sodium chloride, docusate, heparin, LORazepam, morphine injection, nitroGLYCERIN, sodium chloride flush, sodium chloride flush   Vital Signs    Vitals:   05/29/18 0800 05/29/18 0830 05/29/18 0833 05/29/18 0900  BP: 106/72 104/67  118/73    Pulse: 64 63 64 77  Resp: 17 14 (!) 21 19  Temp: 97.7 F (36.5 C) (!) 97.3 F (36.3 C)    TempSrc: Axillary     SpO2: 99% 98% 97% 100%  Weight:      Height:        Intake/Output Summary (Last 24 hours) at 05/29/2018 1023 Last data filed at 05/29/2018 1000 Gross per 24 hour  Intake 622.33 ml  Output 2784 ml  Net -2161.67 ml   Filed Weights   05/27/18 0400 05/28/18 0500 05/29/18 0400  Weight: 172 lb 6.4 oz (78.2 kg) 172 lb 9.9 oz (78.3 kg) 158 lb 1.1 oz (71.7 kg)    Telemetry    No adverse arrhythmias.- Personally Reviewed  ECG    No new- Personally Reviewed  Physical Exam   GEN: No acute distress, extubated, tearful.   Neck: No JVD Cardiac: RRR, no murmurs, rubs, or gallops.  Respiratory: Clear to auscultation bilaterally. GI: Soft, nontender, non-distended  MS: No edema; No deformity. Neuro:  Nonfocal  Psych: No significant agitation currently.  Labs    Chemistry Recent Labs  Lab 05/27/18 0307  05/28/18 0606 05/28/18 2028 05/29/18 0333  NA 139   < > 136 139 139  K 4.6   < > 4.3 3.9 5.1  CL 104   < > 101 102 104  CO2 27   < > 26 24 27   GLUCOSE 119*   < >  131* 87 88  BUN 37*   < > 39* 36* 35*  CREATININE 3.29*   < > 2.98* 2.56* 2.61*  CALCIUM 7.7*   < > 8.2* 6.8* 8.0*  PROT 5.2*  --  5.6*  --  5.4*  ALBUMIN 2.6*   < > 2.5* 2.7* 2.4*  AST 1,778*  --  593*  --  263*  ALT 3,440*  --  1,854*  --  1,326*  ALKPHOS 221*  --  289*  --  226*  BILITOT 2.2*  --  2.3*  --  1.7*  GFRNONAA 20*   < > 23* 27* 27*  GFRAA 23*   < > 26* 32* 31*  ANIONGAP 8   < > 9 13 8    < > = values in this interval not displayed.     Hematology Recent Labs  Lab 05/27/18 0307 05/28/18 0606 05/29/18 0333  WBC 11.8* 10.3 9.3  RBC 3.27* 3.33* 3.23*  HGB 8.9* 9.0* 8.9*  HCT 28.2* 28.6* 27.9*  MCV 86.2 85.9 86.4  MCH 27.2 27.0 27.6  MCHC 31.6 31.5 31.9  RDW 19.2* 19.7* 19.9*  PLT 65* 54* 73*    Cardiac Enzymes Recent Labs  Lab 05/23/18 1206 05/23/18 2253 05/28/18 2027  05/29/18 0333  TROPONINI 1.74* 1.99* 0.92* 0.87*    Recent Labs  Lab 05/22/18 1736  TROPIPOC 0.11*     BNP Recent Labs  Lab 05/22/18 2027  BNP 276.5*     DDimer No results for input(s): DDIMER in the last 168 hours.   Radiology    Dg Chest Port 1 View  Result Date: 05/29/2018 CLINICAL DATA:  Acute respiratory failure. EXAM: PORTABLE CHEST 1 VIEW COMPARISON:  05/26/2018 FINDINGS: Endotracheal tube and NG tube is been removed in the interval. The left IJ central line remains in place, crossing the midline and extending cranially towards the thoracic inlet, likely in the right innominate vein directed cranially. Cardiopericardial silhouette is at upper limits of normal for size. Interval progression of bilateral airspace disease with central predominance. The visualized bony structures of the thorax are intact. Telemetry leads overlie the chest. IMPRESSION: 1. Interval progression of central airspace disease suggests edema. 2. Left IJ central line crosses the midline is directed cranially on the right, overlying the expected location of the right innominate vein. Electronically Signed   By: Misty Stanley M.D.   On: 05/29/2018 02:59   Dg Abd Portable 1v  Result Date: 05/29/2018 CLINICAL DATA:  Pain. EXAM: PORTABLE ABDOMEN - 1 VIEW COMPARISON:  None. FINDINGS: Right femoral catheter terminates at the level of the sacrum. No bowel dilatation to suggest obstruction. Slight increased air throughout central small bowel without abnormal distension. No evidence of free air. No radiopaque calculi. Ill-defined patchy bibasilar opacities. Incidental hemi transitional lumbosacral anatomy. IMPRESSION: Nonobstructive bowel gas pattern. Slight increased small bowel gas suggests mild ileus. Electronically Signed   By: Jeb Levering M.D.   On: 05/29/2018 02:37    Cardiac Studies         Post-Intervention Diagram           ------------------------------------------------------------------- ECHO Study Conclusions  - Left ventricle: The cavity size was normal. Systolic function was severely reduced. The estimated ejection fraction was in the range of 25% to 30%. Severe diffuse hypokinesis with regional variations. There is akinesis of the apicalanterior and apical myocardium. There is akinesis of the midanteroseptal myocardium. There is severe hypokinesis of the inferoseptal myocardium. Features are consistent with a pseudonormal left ventricular filling pattern,  with concomitant abnormal relaxation and increased filling pressure (grade 2 diastolic dysfunction). Doppler parameters are consistent with high ventricular filling pressure. - Aortic valve: A bicuspid morphology cannot be excluded; mildly thickened, mildly calcified leaflets. There was mild to moderate regurgitation. Regurgitation pressure half-time: 718 ms. - Mitral valve: There was mild to moderate regurgitation. Valve area by pressure half-time: 2.39 cm^2. - Right ventricle: Systolic function was moderately reduced. - Atrial septum: There was increased thickness of the septum, consistent with lipomatous hypertrophy. - Tricuspid valve: There was mild regurgitation. - Pulmonary arteries: PA peak pressure: 33 mm Hg (S). - Inferior vena cava: The vessel was dilated. The respirophasic diameter changes were blunted (<50%), consistent with elevated central venous pressure. - Pericardium, extracardiac: A small, free-flowing pericardial effusion was identified circumferential to the heart. The fluid had no internal echoes.There was no evidence of hemodynamic compromise.  Impressions:  - severe LV dysfunction with diffuse HK but apica/anteroseptal and apical anterior AK. Recommend definity contrast study to rule out apical thrombus.     Patient Profile     52 y.o. male with out of hospital  cardiac arrest, prior history of MI several years ago, myasthenia gravis post thymus resection, anterior ST elevation post LAD stent.  Assessment & Plan    Cardiac arrest -05/22/2018, LAD occlusion anterior STEMI DES to LAD.  Had been complaining of some chest pain recently.  Troponin was 2.1.  Drug screen was positive for cocaine.  Off amiodarone, no recurrent ventricular tachycardia.  Had hypothermia protocol.  Rewarmed.  Cardiogenic shock - Needed both epinephrine, Levophed.  Both discontinued.  Blood pressure improved.  Improved  Ischemic cardiomyopathy -EF 25 to 30%.  Currently tolerating low-dose IV metoprolol.  Eventually will convert to heart failure dosing p.o. medications.  Shock liver - Initially AST 10,000, trending down.  Off of statin.  Anoxic encephalopathy - EEG initially was abnormal but his repeat is improving.  Has some generalized background slowing nonspecific.  Hopefully alertness will continue to improve.  Acute respiratory failure -Ventilator management per critical care medicine, possible aspiration Zosyn Vanco  Acute renal failure - CRT.  Creatinine as above  Substance abuse -Cocaine positive alcohol withdrawal.  High risk for future events.  Continue to encourage cessation.  Received Precedex last night, getting Ativan now.  He is now awake, sentimental, crying.  Had a panic attack last night.  Critical care time 35 minutes.  Spent with family discussion with nursing team.  Lavada Mesi with out of hospital cardiac arrest.  Multisystem organ issues.  For questions or updates, please contact [place] Please consult [email or web address] for contact info under Cardiology/STEMI.      Signed, Candee Furbish, MD  05/29/2018, 10:23 AM

## 2018-05-29 NOTE — Progress Notes (Signed)
eLink Physician-Brief Progress Note Patient Name: Shrey Boike DOB: February 04, 1966 MRN: [id number]   Date of Service  05/29/2018  HPI/Events of Note  Agitation Patient has cardiac medications he has not taken because he is awaiting speech therapy evaluation. Per nursing he did well with ice chips earlier.  eICU Interventions  Haldol 0.5-1.0 mg iv Q 6 hrs prn agitation Order to give cardiac medications tonight with small sips of water if he passes a bedside nursing swallow evaluation. No food, medications or drinks otherwise.        Kerry Kass Ji Feldner 05/29/2018, 12:13 AM

## 2018-05-29 NOTE — Procedures (Signed)
I evaluated the patient during CRRT and made appropriate adjustments.   FRR 41mL/hr.  Electrolytes ok.   Jannifer Hick MD

## 2018-05-29 NOTE — Progress Notes (Signed)
SLP Cancellation Note  Patient Details Name: Howard Thompson MRN: [id number] DOB: November 15, 1965   Cancelled treatment:  Attempted to see pt for swallow evaluation.  Held per RN 2/2 lethargy.  Pt requiring face mask for O2 at this time.  Will re-attempt as SLP schedule permits.        [place] 05/29/2018, 8:59 AM

## 2018-05-29 NOTE — Progress Notes (Signed)
PT Cancellation Note  Patient Details Name: Howard Thompson MRN: [id number] DOB: December 18, 1965   Cancelled Treatment:    Reason Eval/Treat Not Completed: Medical issues which prohibited therapy. Per chart review, temp fem cath remains in place. Will continue to follow and initiate PT eval when appropriate.    Thelma Comp 05/29/2018, 6:46 AM   Rolinda Roan, PT, DPT Acute Rehabilitation Services Pager: [phone]

## 2018-05-29 NOTE — Progress Notes (Signed)
PULMONARY / CRITICAL CARE MEDICINE  Name: Howard Thompson MRN: [id number] DOB: [birth year]    ADMISSION DATE:  05/22/2018 CONSULTATION DATE:  05/22/2018  REFERRING MD: Dr. Quay Burow, MD      CHIEF COMPLAINT:  STEMI, Shock   Brief  Howard Thompson, 52 y.o. male admitted on 05/22/2018 s/p unwitnessed out of hospital cardiac arrest, VF, unknown downtime, past medical history of coronary artery disease, myasthenia gravis, hypertension.  Per the electronic medical record the patient received 5 defibrillations and given 300 mg amiodarone.  Upon arrival the patient was found to have a STEMI and was taken to the Cath Lab where a DES was placed and a 100% LAD lesion.  Post catheter patient was taken for therapeutic hypothermia to the intensive care unit.  Per the initial critical care documentation there was concern for posturing.  EKGs initially with a prolonged QT at 560, now improved.  He was initially placed in a c-collar, presenting head CT and CT cervical spine negative for acute abnormalities.   STUDIES:  LHC 05/22/2018 by Dr. Harriet [place] LAD to Prox LAD lesion is 100% stenosed.  A drug-eluting stent was successfully placed using a STENT SYNERGY DES 3X20.  Post intervention, there is a 0% residual stenosis.  There is severe left ventricular systolic dysfunction.  LV end diastolic pressure is mildly elevated.  The left ventricular ejection fraction is less than 25% by visual estimate.  ECHO 05/23/2018: EF 93-79%, Grade 2 Diastolic dysfunction  Regional wall motion abnormalities   EKG 05/23/2018: OTc 560 >> 441 >> 441  CULTURES: 05/22/2018 Tracheal Aspirate  -  Few GPCs and GNRs - PENDING   ANTIBIOTICS: Pip/tazo: 05/23/2018 >>> Vancomycin: 05/23/2018 >>> LINES/TUBES: Left IJ CVC   events 05/22/2018 - admit 7/28 - Stopped hypothermia and started rewarming / Ur TOX - piositive for cocaine and opioids. EF 25% 7/30 - Started CRRT, on low dose NEpi, Right Femoral HD Catheter placement   7/31 - EEG diffuse slowing no epilepsy  8/1 - following basic commands 8/2 - extubation  . No issues overnight.  Awake alert following commands this morning.  Has done well with SBT and SAT.  Passing all weaning parameters.  Able to nod yes and no appropriately to pain.  Denies pain in his neck removal of c-collar and motion of head left to right move without any pain.  CT neck negative on admission.  Orders were given to remove c-collar.  Family at bedside very thankful for care that they have received thus far.   SUBJECTIVE/OVERNIGHT/INTERVAL HX 8/3 - remains on CRRT ad anuric. REmains extubated. Overnight had some agitation and chest discomfort - concern for etoh withdrawal. Precedex helped. This AM - hypoactive delirium and tearful off precedex.  Needing 6LNC 02 with cpap at night. Per Cards this am   Improving shock liver. Off presors. EF on low dose lopressor  VITAL SIGNS: BP 137/77   Pulse 75   Temp 97.9 F (36.6 C)   Resp 15   Ht 5\' 10"  (1.778 m)   Wt 71.7 kg (158 lb 1.1 oz)   SpO2 100%   BMI 22.68 kg/m   HEMODYNAMICS: CVP:  [0 mmHg-8 mmHg] 4 mmHg  VENTILATOR SETTINGS: FiO2 (%):  [55 %] 55 %  INTAKE / OUTPUT: I/O last 3 completed shifts: In: 1820.7 [I.V.:562.5; NG/GT:1050; IV Piggyback:208.2] Out: 0240 [Urine:40; XBDZH:2992; Stool:100]  PHYSICAL EXAMINATION:   General Appearance:    Looks deconditioned. Lying in bed  Head:    Normocephalic, without obvious  abnormality, atraumatic  Eyes:    PERRL - yes, conjunctiva/corneas - clear      Ears:    Normal external ear canals, both ears  Nose:   NG tube - no but has [place] o2  Throat:  ETT TUBE - no , OG tube - no  Neck:   Supple,  No enlargement/tenderness/nodules     Lungs:     No distrss but has scattered crackles  Chest wall:    No deformity  Heart:    S1 and S2 normal, no murmur, CVP - no.  Pressors - no  Abdomen:     Soft, no masses, no organomegaly  Genitalia:    Not done  Rectal:   not done  Extremities:    Extremities- intact     Skin:   Intact in exposed areas .      Neurologic:   Sedation - off precedex -> RASS - +1 curently . Moves all 4s - yes. CAM-ICU - + for delrium . Orientation - follows commans but not fully oriented     PULMONARY Recent Labs  Lab 05/24/18 0503  05/25/18 0426 05/25/18 0745 05/25/18 1008 05/26/18 0516 05/27/18 0745 05/29/18 0334  PHART 7.204*   < > 7.560* 7.500* 7.450 7.409 7.437 7.446  PCO2ART 52.1*   < > 41.8 40.6 46.7 41.9 39.4 36.2  PO2ART 212.0*   < > 178.0* 129* 130* 93.0 154.0* 85.0  HCO3 21.4   < > 37.5* 31.4* 32.0* 26.5 26.7 24.9  TCO2 23  --  39*  --   --  28 28 26   O2SAT 100.0   < > 100.0 98.5 98.7 97.0 99.0 97.0   < > = values in this interval not displayed.    CBC Recent Labs  Lab 05/27/18 0307 05/28/18 0606 05/29/18 0333  HGB 8.9* 9.0* 8.9*  HCT 28.2* 28.6* 27.9*  WBC 11.8* 10.3 9.3  PLT 65* 54* 73*    COAGULATION Recent Labs  Lab 05/22/18 1744 05/22/18 2027 05/23/18 0439 05/24/18 1010 05/25/18 0409  INR 1.07 5.53* 1.55 3.87 2.25    CARDIAC   Recent Labs  Lab 05/23/18 0822 05/23/18 1206 05/23/18 2253 05/28/18 2027 05/29/18 0333  TROPONINI 1.69* 1.74* 1.99* 0.92* 0.87*   No results for input(s): PROBNP in the last 168 hours.   CHEMISTRY Recent Labs  Lab 05/25/18 0409 05/26/18 0405 05/27/18 0307 05/27/18 1614 05/27/18 1837 05/28/18 0606 05/28/18 2028 05/29/18 0333  NA 139 136 139 133* 138 136 139 139  K 4.0 4.2 4.6 7.3* 4.8 4.3 3.9 5.1  CL 93* 98 104 102 100 101 102 104  CO2 31 27 27 25 25 26 24 27   GLUCOSE 60* 120* 119* 122* 147* 131* 87 88  BUN 39* 43* 37* 38* 39* 39* 36* 35*  CREATININE 3.74* 3.84* 3.29* 3.14* 3.23* 2.98* 2.56* 2.61*  CALCIUM 6.5* 6.9* 7.7* 7.3* 8.0* 8.2* 6.8* 8.0*  MG 1.7 2.2 2.4  --   --  2.5*  --  2.5*  PHOS 4.3 3.7 2.4* 3.2  --  2.6 1.7* 2.5   Estimated Creatinine Clearance: 34 mL/min (A) (by C-G formula based on SCr of 2.61 mg/dL (H)).   LIVER Recent Labs  Lab  05/22/18 1744 05/22/18 2027 05/23/18 0439  05/24/18 1010 05/25/18 0409 05/26/18 0405 05/27/18 7616 05/27/18 1614 05/28/18 0606 05/28/18 2028 05/29/18 0333  AST  --   --   --    < > >10,000* 9,330* 4,085* 1,778*  --  593*  --  263*  ALT  --   --   --    < > 8,869* 6,238* 4,620* 3,440*  --  1,854*  --  1,326*  ALKPHOS  --   --   --    < > 107 105 131* 221*  --  289*  --  226*  BILITOT  --   --   --    < > 1.4* 2.5* 2.1* 2.2*  --  2.3*  --  1.7*  PROT  --   --   --    < > 4.2* 4.7* 5.2* 5.2*  --  5.6*  --  5.4*  ALBUMIN  --   --   --    < > 2.4* 2.5* 2.6* 2.6* 2.4* 2.5* 2.7* 2.4*  INR 1.07 5.53* 1.55  --  3.87 2.25  --   --   --   --   --   --    < > = values in this interval not displayed.     INFECTIOUS Recent Labs  Lab 05/22/18 2027  05/23/18 2253 05/24/18 0412 05/24/18 1756  LATICACIDVEN 2.8*   < > 10.2* 11.8* 5.2*  PROCALCITON 0.14  --   --   --   --    < > = values in this interval not displayed.     ENDOCRINE CBG (last 3)  Recent Labs    05/29/18 0349 05/29/18 0726 05/29/18 1204  GLUCAP 92 80 80         IMAGING x48h  - image(s) personally visualized  -   highlighted in bold Dg Chest Port 1 View  Result Date: 05/29/2018 CLINICAL DATA:  Acute respiratory failure. EXAM: PORTABLE CHEST 1 VIEW COMPARISON:  05/26/2018 FINDINGS: Endotracheal tube and NG tube is been removed in the interval. The left IJ central line remains in place, crossing the midline and extending cranially towards the thoracic inlet, likely in the right innominate vein directed cranially. Cardiopericardial silhouette is at upper limits of normal for size. Interval progression of bilateral airspace disease with central predominance. The visualized bony structures of the thorax are intact. Telemetry leads overlie the chest. IMPRESSION: 1. Interval progression of central airspace disease suggests edema. 2. Left IJ central line crosses the midline is directed cranially on the right, overlying the  expected location of the right innominate vein. Electronically Signed   By: Misty Stanley M.D.   On: 05/29/2018 02:59   Dg Abd Portable 1v  Result Date: 05/29/2018 CLINICAL DATA:  Pain. EXAM: PORTABLE ABDOMEN - 1 VIEW COMPARISON:  None. FINDINGS: Right femoral catheter terminates at the level of the sacrum. No bowel dilatation to suggest obstruction. Slight increased air throughout central small bowel without abnormal distension. No evidence of free air. No radiopaque calculi. Ill-defined patchy bibasilar opacities. Incidental hemi transitional lumbosacral anatomy. IMPRESSION: Nonobstructive bowel gas pattern. Slight increased small bowel gas suggests mild ileus. Electronically Signed   By: Jeb Levering M.D.   On: 05/29/2018 02:37     1. Acute Hypoxemic Respiratory Failure s/p Cardiac Arrest, s/p 5 defibrillations, 300mg  Amiodarone with ROSC, CXR with bilateral hilar and basilar opacities, consistent with staphylococcal pneumonia , requiring mechanical ventilation, now status post extubation   - 05/29/2018  - remains extubated x 24h. On 6LNC with pulse ox100%. CPAP QHS  Plan  - wean o2 - pulm toilet as able - CPAP QHS  2. Cardiogenic Shock, s/p STEMI with 100% LAD Occlusion with placement of DES, Acute Systolic and Diastolic Heart failure, improving. Setting of  cocaine    PLAN  - per cads   3. Acute renal failure, Lactic Acidosis, improving, oliguria, Mixed Acid base disorder     -CRRT management per nephrology; remains anuric 05/29/18    4.  GI prophylaxis  -Continue Pepcid  5. Coagulopathy, elevated INR, likely related to Shock Liver, Elevated Liver enzymes  Chronic alcoholism, daily drinker, substance abuse history with urine positive for cocaine   8/3 - shock liver improiving  plan  -Continue vitamin supplementation  -We will need counseling on substance abuse   6. Prior prolonged QTc, now improved on most recent EKGs   -Avoid QT prolonging agents  7.  Metabolic  encephalopathy,   - 05/29/18 - recurred overnight. ANdneeded precedex. Now off precedex  8. Drainage from the right nare, improved 9.  Glycemic control, SSI 10. Displacement left IJ CVC, appearing to be turning upwards toward the right subclavian  -dc Left IJ 11. Thrombocytopenia, multifactorial, sepsis, shock, unknown liver history with chronic ETOH use   - await HITT Panel as of 05/29/18  DVT PPX: Heparin, SCDs  FAMILY  - Updates: Multiple family members at bedside. - wife at bedsidee     Dr. Brand Males, M.D., [facility].C.P Pulmonary and Critical Care Medicine Staff Physician [facility] Pager: [phone], If no answer or between  15:00h - 7:00h: call [phone]  05/29/2018 1:49 PM

## 2018-05-30 ENCOUNTER — Inpatient Hospital Stay (HOSPITAL_COMMUNITY): Payer: No Typology Code available for payment source

## 2018-05-30 DIAGNOSIS — G9340 Encephalopathy, unspecified: Secondary | ICD-10-CM

## 2018-05-30 LAB — GLUCOSE, CAPILLARY
GLUCOSE-CAPILLARY: 99 mg/dL (ref 70–99)
GLUCOSE-CAPILLARY: 102 mg/dL — AB (ref 70–99)
Glucose-Capillary: 86 mg/dL (ref 70–99)
Glucose-Capillary: 94 mg/dL (ref 70–99)

## 2018-05-30 LAB — BASIC METABOLIC PANEL
Anion gap: 11 (ref 5–15)
BUN: 38 mg/dL — AB (ref 6–20)
CO2: 26 mmol/L (ref 22–32)
CREATININE: 2.74 mg/dL — AB (ref 0.61–1.24)
Calcium: 8.2 mg/dL — ABNORMAL LOW (ref 8.9–10.3)
Chloride: 101 mmol/L (ref 98–111)
GFR calc Af Amer: 29 mL/min — ABNORMAL LOW (ref >60)
GFR, EST NON AFRICAN AMERICAN: 25 mL/min — AB (ref >60)
GLUCOSE: 87 mg/dL (ref 70–99)
POTASSIUM: 4.7 mmol/L (ref 3.5–5.1)
SODIUM: 138 mmol/L (ref 135–145)

## 2018-05-30 LAB — HEPATIC FUNCTION PANEL
ALT: 577 U/L — AB (ref 0–44)
AST: 130 U/L — ABNORMAL HIGH (ref 15–41)
Albumin: 2.4 g/dL — ABNORMAL LOW (ref 3.5–5.0)
Alkaline Phosphatase: 199 U/L — ABNORMAL HIGH (ref 38–126)
BILIRUBIN DIRECT: 0.5 mg/dL — AB (ref 0.0–0.2)
BILIRUBIN INDIRECT: 0.9 mg/dL (ref 0.3–0.9)
TOTAL PROTEIN: 5.7 g/dL — AB (ref 6.5–8.1)
Total Bilirubin: 1.4 mg/dL — ABNORMAL HIGH (ref 0.3–1.2)

## 2018-05-30 LAB — CULTURE, BLOOD (ROUTINE X 2): SPECIAL REQUESTS: ADEQUATE

## 2018-05-30 LAB — CBC WITH DIFFERENTIAL/PLATELET
Abs Immature Granulocytes: 0.2 10*3/uL — ABNORMAL HIGH (ref 0.0–0.1)
BASOS ABS: 0 10*3/uL (ref 0.0–0.1)
Basophils Relative: 0 %
EOS ABS: 0 10*3/uL (ref 0.0–0.7)
Eosinophils Relative: 0 %
HEMATOCRIT: 29.9 % — AB (ref 39.0–52.0)
Hemoglobin: 9.5 g/dL — ABNORMAL LOW (ref 13.0–17.0)
IMMATURE GRANULOCYTES: 2 %
LYMPHS ABS: 0.7 10*3/uL (ref 0.7–4.0)
LYMPHS PCT: 7 %
MCH: 27.4 pg (ref 26.0–34.0)
MCHC: 31.8 g/dL (ref 30.0–36.0)
MCV: 86.2 fL (ref 78.0–100.0)
Monocytes Absolute: 1 10*3/uL (ref 0.1–1.0)
Monocytes Relative: 9 %
Neutro Abs: 8.9 10*3/uL — ABNORMAL HIGH (ref 1.7–7.7)
Neutrophils Relative %: 82 %
Platelets: 90 10*3/uL — ABNORMAL LOW (ref 150–400)
RBC: 3.47 MIL/uL — AB (ref 4.22–5.81)
RDW: 20.9 % — AB (ref 11.5–15.5)
WBC: 10.8 10*3/uL — ABNORMAL HIGH (ref 4.0–10.5)

## 2018-05-30 LAB — MAGNESIUM: MAGNESIUM: 2.7 mg/dL — AB (ref 1.7–2.4)

## 2018-05-30 LAB — PHOSPHORUS: Phosphorus: 2.5 mg/dL (ref 2.5–4.6)

## 2018-05-30 LAB — LACTIC ACID, PLASMA: Lactic Acid, Venous: 1.3 mmol/L (ref 0.5–1.9)

## 2018-05-30 NOTE — Progress Notes (Signed)
Progress Note  Patient Name: Howard Thompson Date of Encounter: 05/30/2018  Primary Cardiologist: No primary care provider on file.   Subjective   Awake, tearful, holding my hand.  Sentimental.  Confused.  Inpatient Medications    Scheduled Meds: . sodium chloride   Intravenous Once  . B-complex with vitamin C  1 tablet Per Tube Daily  . Chlorhexidine Gluconate Cloth  6 each Topical Daily  . famotidine  20 mg Oral Daily  . folic acid  1 mg Oral Daily  . heparin  5,000 Units Subcutaneous Q8H  . hydrocortisone sod succinate (SOLU-CORTEF) inj  50 mg Intravenous Daily  . insulin aspart  0-9 Units Subcutaneous Q4H  . mouth rinse  15 mL Mouth Rinse BID  . metoprolol tartrate  5 mg Intravenous Q6H  . multivitamin with minerals  1 tablet Oral Daily  . nitroGLYCERIN  0.5 inch Topical Q6H  . sodium chloride flush  10-40 mL Intracatheter Q12H  . sodium chloride flush  3 mL Intravenous Q12H  . thiamine  100 mg Oral Daily  . ticagrelor  90 mg Per Tube BID  . zinc sulfate  220 mg Oral Daily   Continuous Infusions: . sodium chloride 10 mL/hr at 05/27/18 1638  . sodium chloride 10 mL/hr at 05/30/18 0800  . sodium chloride 500 mL (05/27/18 1647)  . dexmedetomidine (PRECEDEX) IV infusion 0.3 mcg/kg/hr (05/30/18 0700)  . dextrose Stopped (05/25/18 0931)  . feeding supplement (VITAL AF 1.2 CAL) Stopped (05/28/18 1130)   PRN Meds: sodium chloride, sodium chloride, docusate, hydrALAZINE, LORazepam, nitroGLYCERIN, sodium chloride flush, sodium chloride flush   Vital Signs    Vitals:   05/30/18 0400 05/30/18 0500 05/30/18 0600 05/30/18 0700  BP: (!) 151/94 (!) 145/87 140/84 (!) 147/116  Pulse: 73 68 81   Resp: 20 20 18  (!) 26  Temp: 98.2 F (36.8 C) 98.2 F (36.8 C) 98.2 F (36.8 C) 98.6 F (37 C)  TempSrc:      SpO2: 100% 100% 100% 94%  Weight:    148 lb 5.9 oz (67.3 kg)  Height:        Intake/Output Summary (Last 24 hours) at 05/30/2018 0910 Last data filed at 05/30/2018  0830 Gross per 24 hour  Intake 587.39 ml  Output 2522 ml  Net -1934.61 ml   Filed Weights   05/28/18 0500 05/29/18 0400 05/30/18 0700  Weight: 172 lb 9.9 oz (78.3 kg) 158 lb 1.1 oz (71.7 kg) 148 lb 5.9 oz (67.3 kg)    Telemetry    No adverse arrhythmias.- Personally Reviewed  ECG    05/30/2018 7 AM-heart rate 71 bpm sinus rhythm.- Personally Reviewed  Physical Exam   GEN: No acute distress, extubated, tearful.   Neck: No JVD Cardiac: RRR, no murmurs, rubs, or gallops.  Respiratory: Clear to auscultation bilaterally. GI: Soft, nontender, non-distended  MS: No edema; No deformity. Neuro:  Nonfocal  Psych: No significant agitation currently.  Labs    Chemistry Recent Labs  Lab 05/28/18 0606 05/28/18 2028 05/29/18 0333 05/30/18 0357  NA 136 139 139 138  K 4.3 3.9 5.1 4.7  CL 101 102 104 101  CO2 26 24 27 26   GLUCOSE 131* 87 88 87  BUN 39* 36* 35* 38*  CREATININE 2.98* 2.56* 2.61* 2.74*  CALCIUM 8.2* 6.8* 8.0* 8.2*  PROT 5.6*  --  5.4* 5.7*  ALBUMIN 2.5* 2.7* 2.4* 2.4*  AST 593*  --  263* 130*  ALT 1,854*  --  1,326* 577*  ALKPHOS 289*  --  226* 199*  BILITOT 2.3*  --  1.7* 1.4*  GFRNONAA 23* 27* 27* 25*  GFRAA 26* 32* 31* 29*  ANIONGAP 9 13 8 11      Hematology Recent Labs  Lab 05/28/18 0606 05/29/18 0333 05/30/18 0357  WBC 10.3 9.3 10.8*  RBC 3.33* 3.23* 3.47*  HGB 9.0* 8.9* 9.5*  HCT 28.6* 27.9* 29.9*  MCV 85.9 86.4 86.2  MCH 27.0 27.6 27.4  MCHC 31.5 31.9 31.8  RDW 19.7* 19.9* 20.9*  PLT 54* 73* 90*    Cardiac Enzymes Recent Labs  Lab 05/23/18 2253 05/28/18 2027 05/29/18 0333 05/29/18 1445  TROPONINI 1.99* 0.92* 0.87* 0.66*    No results for input(s): TROPIPOC in the last 168 hours.   BNP No results for input(s): BNP, PROBNP in the last 168 hours.   DDimer No results for input(s): DDIMER in the last 168 hours.   Radiology    Dg Chest Port 1 View  Result Date: 05/29/2018 CLINICAL DATA:  Acute respiratory failure. EXAM: PORTABLE  CHEST 1 VIEW COMPARISON:  05/26/2018 FINDINGS: Endotracheal tube and NG tube is been removed in the interval. The left IJ central line remains in place, crossing the midline and extending cranially towards the thoracic inlet, likely in the right innominate vein directed cranially. Cardiopericardial silhouette is at upper limits of normal for size. Interval progression of bilateral airspace disease with central predominance. The visualized bony structures of the thorax are intact. Telemetry leads overlie the chest. IMPRESSION: 1. Interval progression of central airspace disease suggests edema. 2. Left IJ central line crosses the midline is directed cranially on the right, overlying the expected location of the right innominate vein. Electronically Signed   By: Misty Stanley M.D.   On: 05/29/2018 02:59   Dg Abd Portable 1v  Result Date: 05/29/2018 CLINICAL DATA:  Pain. EXAM: PORTABLE ABDOMEN - 1 VIEW COMPARISON:  None. FINDINGS: Right femoral catheter terminates at the level of the sacrum. No bowel dilatation to suggest obstruction. Slight increased air throughout central small bowel without abnormal distension. No evidence of free air. No radiopaque calculi. Ill-defined patchy bibasilar opacities. Incidental hemi transitional lumbosacral anatomy. IMPRESSION: Nonobstructive bowel gas pattern. Slight increased small bowel gas suggests mild ileus. Electronically Signed   By: Jeb Levering M.D.   On: 05/29/2018 02:37    Cardiac Studies         Post-Intervention Diagram          ------------------------------------------------------------------- ECHO Study Conclusions  - Left ventricle: The cavity size was normal. Systolic function was severely reduced. The estimated ejection fraction was in the range of 25% to 30%. Severe diffuse hypokinesis with regional variations. There is akinesis of the apicalanterior and apical myocardium. There is akinesis of the midanteroseptal  myocardium. There is severe hypokinesis of the inferoseptal myocardium. Features are consistent with a pseudonormal left ventricular filling pattern, with concomitant abnormal relaxation and increased filling pressure (grade 2 diastolic dysfunction). Doppler parameters are consistent with high ventricular filling pressure. - Aortic valve: A bicuspid morphology cannot be excluded; mildly thickened, mildly calcified leaflets. There was mild to moderate regurgitation. Regurgitation pressure half-time: 718 ms. - Mitral valve: There was mild to moderate regurgitation. Valve area by pressure half-time: 2.39 cm^2. - Right ventricle: Systolic function was moderately reduced. - Atrial septum: There was increased thickness of the septum, consistent with lipomatous hypertrophy. - Tricuspid valve: There was mild regurgitation. - Pulmonary arteries: PA peak pressure: 33 mm Hg (S). - Inferior vena cava: The vessel  was dilated. The respirophasic diameter changes were blunted (<50%), consistent with elevated central venous pressure. - Pericardium, extracardiac: A small, free-flowing pericardial effusion was identified circumferential to the heart. The fluid had no internal echoes.There was no evidence of hemodynamic compromise.  Impressions:  - severe LV dysfunction with diffuse HK but apica/anteroseptal and apical anterior AK. Recommend definity contrast study to rule out apical thrombus.     Patient Profile     52 y.o. male with out of hospital cardiac arrest, prior history of MI several years ago, myasthenia gravis post thymus resection, anterior ST elevation post LAD stent.  Assessment & Plan    Cardiac arrest -05/22/2018, LAD occlusion anterior STEMI DES to LAD.  Had been complaining of some chest pain recently.  Troponin was 2.1.  Drug screen was positive for cocaine.  Off amiodarone, no recurrent ventricular tachycardia.  Had hypothermia protocol.   Rewarmed.  Cardiogenic shock - Needed both epinephrine, Levophed.  Both discontinued.  Blood pressure improved.  Improved, no changes.  Currently on low-dose IV metoprolol.  Ischemic cardiomyopathy -EF 25 to 30%.  Currently tolerating low-dose IV metoprolol.  Eventually will convert to heart failure dosing p.o. medications.  No changes  Shock liver - Initially AST 10,000, trending down.  Off of statin.  Anoxic encephalopathy - EEG initially was abnormal but his repeat is improving.  Has some generalized background slowing nonspecific.  Hopefully alertness will continue to improve.  Quite tearful yesterday.  Some alcohol withdrawal possibility.  Acute respiratory failure -Extubated.  Weaning O2.  Acute renal failure - CRT.  Creatinine as above  Substance abuse -Cocaine positive alcohol withdrawal.  High risk for future events.  Continue to encourage cessation.  Slowly improving.  Spoke with family.   For questions or updates, please contact [place] Please consult [email or web address] for contact info under Cardiology/STEMI.      Signed, Candee Furbish, MD  05/30/2018, 9:10 AM

## 2018-05-30 NOTE — Procedures (Signed)
I evaluated the patient during CRRT and made appropriate adjustments.   FRR 30mL/hr.  Electrolytes ok.   Hemodynamically OK now --> will discontinue CRRT today and monitor for iHD needs.   Jannifer Hick MD

## 2018-05-30 NOTE — Progress Notes (Signed)
PULMONARY / CRITICAL CARE MEDICINE  Name: Howard Thompson MRN: [id number] DOB: 1966-09-26    ADMISSION DATE:  05/22/2018 CONSULTATION DATE:  05/22/2018  REFERRING MD: Dr. Quay Burow, MD      CHIEF COMPLAINT:  STEMI, Shock   Brief  Howard Thompson, 52 y.o. male admitted on 05/22/2018 s/p unwitnessed out of hospital cardiac arrest, VF, unknown downtime, past medical history of coronary artery disease, myasthenia gravis, hypertension.  Per the electronic medical record the patient received 5 defibrillations and given 300 mg amiodarone.  Upon arrival the patient was found to have a STEMI and was taken to the Cath Lab where a DES was placed and a 100% LAD lesion.  Post catheter patient was taken for therapeutic hypothermia to the intensive care unit.  Per the initial critical care documentation there was concern for posturing.  EKGs initially with a prolonged QT at 560, now improved.  He was initially placed in a c-collar, presenting head CT and CT cervical spine negative for acute abnormalities.   STUDIES:  LHC 05/22/2018 by Dr. Harriet Butte LAD to Prox LAD lesion is 100% stenosed.  A drug-eluting stent was successfully placed using a STENT SYNERGY DES 3X20.  Post intervention, there is a 0% residual stenosis.  There is severe left ventricular systolic dysfunction.  LV end diastolic pressure is mildly elevated.  The left ventricular ejection fraction is less than 25% by visual estimate.  ECHO 05/23/2018: EF 22-02%, Grade 2 Diastolic dysfunction  Regional wall motion abnormalities   EKG 05/23/2018: OTc 560 >> 441 >> 441  CULTURES: 05/22/2018 Tracheal Aspirate  -  Few GPCs and GNRs - PENDING   ANTIBIOTICS: Pip/tazo: 05/23/2018 >>> Vancomycin: 05/23/2018 >>> LINES/TUBES: Left IJ CVC   events 05/22/2018 - admit 7/28 - Stopped hypothermia and started rewarming / Ur TOX - piositive for cocaine and opioids. EF 25% 7/30 - Started CRRT, on low dose NEpi, Right Femoral HD Catheter placement   7/31 - EEG diffuse slowing no epilepsy  8/1 - following basic commands 8/2 - extubation  . No issues overnight.  Awake alert following commands this morning.  Has done well with SBT and SAT.  Passing all weaning parameters.  Able to nod yes and no appropriately to pain.  Denies pain in his neck removal of c-collar and motion of head left to right move without any pain.  CT neck negative on admission.  Orders were given to remove c-collar.  Family at bedside very thankful for care that they have received thus far.  8/3 - remains on CRRT ad anuric. REmains extubated. Overnight had some agitation and chest discomfort - concern for etoh withdrawal. Precedex helped. This AM - hypoactive delirium and tearful off precedex.  Needing 6LNC 02 with cpap at night. Per Cards this am   Improving shock liver. Off presors. EF on low dose lopressor    SUBJECTIVE/OVERNIGHT/INTERVAL HX 8/4 - SLP recommended D1 diet. Remains extubated. Renal stopped CRRT. Anuric - made only 10cc since morning. Tearful. Yesterday pm- got agiated and needed precedex. This morning currently on precedex and less tearful compared to yesterday. No fever Plat improving. Shock liver better. Has ongoring HD cath in femoral area - renal recommends switch to IJ - might need iHD for few weeks   VITAL SIGNS: BP 97/63   Pulse 74   Temp 98 F (36.7 C) (Oral)   Resp (!) 23   Ht 5\' 10"  (1.778 m)   Wt 67.3 kg (148 lb 5.9 oz)   SpO2  98%   BMI 21.29 kg/m   HEMODYNAMICS:    VENTILATOR SETTINGS:    INTAKE / OUTPUT: I/O last 3 completed shifts: In: 881.9 [P.O.:30; I.V.:630.7; Other:10; IV Piggyback:211.2] Out: 6314 [Urine:85; HFWYO:3785; Stool:150]  PHYSICAL EXAMINATION:   General Appearance:    Looks very deconditioned  Head:    Normocephalic, without obvious abnormality, atraumatic  Eyes:    PERRL - yes, conjunctiva/corneas - clear      Ears:    Normal external ear canals, both ears  Nose:   NG tube - no but hasNC  Throat:  ETT  TUBE - no , OG tube - no  Neck:   Supple,  No enlargement/tenderness/nodules     Lungs:     Clear to auscultation bilaterally,  Chest wall:    No deformity  Heart:    S1 and S2 normal, no murmur, CVP - no.  Pressors - no  Abdomen:     Soft, no masses, no organomegaly  Genitalia:    Not done  Rectal:   not done  Extremities:   Extremities- intact     Skin:   Intact in exposed areas .      Neurologic:   Sedation - precedex gtt -> RASS - 0 . Moves all 4s - yes but weak diffuselye. CAM-ICU - improved but mildly positive . Orientation - improving but still patial   \  PULMONARY Recent Labs  Lab 05/24/18 0503  05/25/18 0426 05/25/18 0745 05/25/18 1008 05/26/18 0516 05/27/18 0745 05/29/18 0334  PHART 7.204*   < > 7.560* 7.500* 7.450 7.409 7.437 7.446  PCO2ART 52.1*   < > 41.8 40.6 46.7 41.9 39.4 36.2  PO2ART 212.0*   < > 178.0* 129* 130* 93.0 154.0* 85.0  HCO3 21.4   < > 37.5* 31.4* 32.0* 26.5 26.7 24.9  TCO2 23  --  39*  --   --  28 28 26   O2SAT 100.0   < > 100.0 98.5 98.7 97.0 99.0 97.0   < > = values in this interval not displayed.    CBC Recent Labs  Lab 05/28/18 0606 05/29/18 0333 05/30/18 0357  HGB 9.0* 8.9* 9.5*  HCT 28.6* 27.9* 29.9*  WBC 10.3 9.3 10.8*  PLT 54* 73* 90*    COAGULATION Recent Labs  Lab 05/24/18 1010 05/25/18 0409  INR 3.87 2.25    CARDIAC   Recent Labs  Lab 05/23/18 2253 05/28/18 2027 05/29/18 0333 05/29/18 1445  TROPONINI 1.99* 0.92* 0.87* 0.66*   No results for input(s): PROBNP in the last 168 hours.   CHEMISTRY Recent Labs  Lab 05/26/18 0405 05/27/18 0307 05/27/18 1614 05/27/18 1837 05/28/18 0606 05/28/18 2028 05/29/18 0333 05/30/18 0357  NA 136 139 133* 138 136 139 139 138  K 4.2 4.6 7.3* 4.8 4.3 3.9 5.1 4.7  CL 98 104 102 100 101 102 104 101  CO2 27 27 25 25 26 24 27 26   GLUCOSE 120* 119* 122* 147* 131* 87 88 87  BUN 43* 37* 38* 39* 39* 36* 35* 38*  CREATININE 3.84* 3.29* 3.14* 3.23* 2.98* 2.56* 2.61* 2.74*   CALCIUM 6.9* 7.7* 7.3* 8.0* 8.2* 6.8* 8.0* 8.2*  MG 2.2 2.4  --   --  2.5*  --  2.5* 2.7*  PHOS 3.7 2.4* 3.2  --  2.6 1.7* 2.5 2.5   Estimated Creatinine Clearance: 30.4 mL/min (A) (by C-G formula based on SCr of 2.74 mg/dL (H)).   LIVER Recent Labs  Lab 05/24/18 1010 05/25/18 0409 05/26/18  0405 05/27/18 0307 05/27/18 1614 05/28/18 0606 05/28/18 2028 05/29/18 0333 05/30/18 0357  AST >10,000* 9,330* 4,085* 1,778*  --  593*  --  263* 130*  ALT 8,869* 6,238* 4,620* 3,440*  --  1,854*  --  1,326* 577*  ALKPHOS 107 105 131* 221*  --  289*  --  226* 199*  BILITOT 1.4* 2.5* 2.1* 2.2*  --  2.3*  --  1.7* 1.4*  PROT 4.2* 4.7* 5.2* 5.2*  --  5.6*  --  5.4* 5.7*  ALBUMIN 2.4* 2.5* 2.6* 2.6* 2.4* 2.5* 2.7* 2.4* 2.4*  INR 3.87 2.25  --   --   --   --   --   --   --      INFECTIOUS Recent Labs  Lab 05/24/18 0412 05/24/18 1756 05/29/18 2332  LATICACIDVEN 11.8* 5.2* 1.3     ENDOCRINE CBG (last 3)  Recent Labs    05/29/18 1518 05/29/18 2107 05/30/18 0751  GLUCAP 85 81 86         IMAGING x48h  - image(s) personally visualized  -   highlighted in bold Dg Chest Port 1 View  Result Date: 05/29/2018 CLINICAL DATA:  Acute respiratory failure. EXAM: PORTABLE CHEST 1 VIEW COMPARISON:  05/26/2018 FINDINGS: Endotracheal tube and NG tube is been removed in the interval. The left IJ central line remains in place, crossing the midline and extending cranially towards the thoracic inlet, likely in the right innominate vein directed cranially. Cardiopericardial silhouette is at upper limits of normal for size. Interval progression of bilateral airspace disease with central predominance. The visualized bony structures of the thorax are intact. Telemetry leads overlie the chest. IMPRESSION: 1. Interval progression of central airspace disease suggests edema. 2. Left IJ central line crosses the midline is directed cranially on the right, overlying the expected location of the right innominate  vein. Electronically Signed   By: Misty Stanley M.D.   On: 05/29/2018 02:59   Dg Abd Portable 1v  Result Date: 05/29/2018 CLINICAL DATA:  Pain. EXAM: PORTABLE ABDOMEN - 1 VIEW COMPARISON:  None. FINDINGS: Right femoral catheter terminates at the level of the sacrum. No bowel dilatation to suggest obstruction. Slight increased air throughout central small bowel without abnormal distension. No evidence of free air. No radiopaque calculi. Ill-defined patchy bibasilar opacities. Incidental hemi transitional lumbosacral anatomy. IMPRESSION: Nonobstructive bowel gas pattern. Slight increased small bowel gas suggests mild ileus. Electronically Signed   By: Jeb Levering M.D.   On: 05/29/2018 02:37   Dg Swallowing Func-speech Pathology  Result Date: 05/30/2018 Objective Swallowing Evaluation: Type of Study: Bedside Swallow Evaluation  Patient Details Name: Howard Thompson MRN: [id number] Date of Birth: 10/25/1966 Today's Date: 05/30/2018 Time: SLP Start Time (ACUTE ONLY): 1115 -SLP Stop Time (ACUTE ONLY): 1138 SLP Time Calculation (min) (ACUTE ONLY): 23 min Past Medical History: No past medical history on file. Past Surgical History: Past Surgical History: Procedure Laterality Date . CORONARY STENT INTERVENTION N/A 05/22/2018  Procedure: CORONARY STENT INTERVENTION;  Surgeon: Lorretta Harp, MD;  Location: [facility];  Service: Cardiovascular;  Laterality: N/A; . LEFT HEART CATH AND CORONARY ANGIOGRAPHY N/A 05/22/2018  Procedure: LEFT HEART CATH AND CORONARY ANGIOGRAPHY;  Surgeon: Lorretta Harp, MD;  Location: [facility];  Service: Cardiovascular;  Laterality: N/A; HPI: Howard Thompson is a 52 y.o. male admitted on 7/27 s/p unwitnessed out of hospital cardiac arrest, VF, unknown downtime, past medical history of coronary artery disease, myasthenia gravis, hypertension.  Per wife, pt has  had hx of dysphagia related to MG which is largely resolved.  Pt was intubated from 7/27 - 8/2.  Subjective: The  patient was seen in radiology for instrumental exam with nurse present.  Assessment / Plan / Recommendation CHL IP CLINICAL IMPRESSIONS 05/30/2018 Clinical Impression MBS was completed using thin liquids via spoon and straw, nectar thick liquids via cup sip, pureed material and solids.  The patient presented with a mild pharyngeal dysphagia.  His biggest risk factor for aspiration is his current mentation and ability to attend to task.  At times, he held material orally requiring cue to swallow.  This was related to his current level of confusion rather then a true oral dysphagia.  The pharyngeal phase was marked by mildly reduced base of tongue retraction which led to mild vallecula residue across textures.  There was one episode of penetration to the level of the vocal cords given a straw sip of thin liquids following presentation of regular solids. The patient required cue to cough and reswallow that was minimally successful most likely due to lack of effort on the his part due to difficulty fully following commands.   Esophageal sweep revealed it slow to clear with liquid wash being helpful to facilitate clearance.  Recommend continue with a pureed diet with thin liquids.  The patient MUST be sat totally upright for all intake and should eat/drink slowly.  As mentation clears he can most likely be advanced to regular solids.  As stated above his biggest risk factor for aspiration is his current mentation and decreased attention to task  ST will follow for therapeutic diet tolerance and possible diet advancement as mentation hopefully clears.  MD please consider cognitive/liguistic evaluation. SLP Visit Diagnosis Dysphagia, oropharyngeal phase (R13.12) Attention and concentration deficit following -- Frontal lobe and executive function deficit following -- Impact on safety and function Mild aspiration risk   CHL IP TREATMENT RECOMMENDATION 05/30/2018 Treatment Recommendations Therapy as outlined in treatment plan  below   Prognosis 05/30/2018 Prognosis for Safe Diet Advancement Good Barriers to Reach Goals Cognitive deficits Barriers/Prognosis Comment -- CHL IP DIET RECOMMENDATION 05/30/2018 SLP Diet Recommendations Dysphagia 1 (Puree) solids;Thin liquid Liquid Administration via Cup;Straw Medication Administration Whole meds with puree Compensations Minimize environmental distractions;Slow rate;Small sips/bites Postural Changes Seated upright at 90 degrees;Remain semi-upright after after feeds/meals (Comment)   CHL IP OTHER RECOMMENDATIONS 05/30/2018 Recommended Consults -- Oral Care Recommendations Oral care before and after PO Other Recommendations --   CHL IP FOLLOW UP RECOMMENDATIONS 05/30/2018 Follow up Recommendations Other (comment)   CHL IP FREQUENCY AND DURATION 05/30/2018 Speech Therapy Frequency (ACUTE ONLY) min 2x/week Treatment Duration 2 weeks      CHL IP ORAL PHASE 05/30/2018 Oral Phase WFL Oral - Pudding Teaspoon -- Oral - Pudding Cup -- Oral - Honey Teaspoon -- Oral - Honey Cup -- Oral - Nectar Teaspoon -- Oral - Nectar Cup -- Oral - Nectar Straw -- Oral - Thin Teaspoon -- Oral - Thin Cup -- Oral - Thin Straw -- Oral - Puree -- Oral - Mech Soft -- Oral - Regular -- Oral - Multi-Consistency -- Oral - Pill -- Oral Phase - Comment --  CHL IP PHARYNGEAL PHASE 05/30/2018 Pharyngeal Phase Impaired Pharyngeal- Pudding Teaspoon -- Pharyngeal -- Pharyngeal- Pudding Cup -- Pharyngeal -- Pharyngeal- Honey Teaspoon -- Pharyngeal -- Pharyngeal- Honey Cup -- Pharyngeal -- Pharyngeal- Nectar Teaspoon -- Pharyngeal -- Pharyngeal- Nectar Cup Delayed swallow initiation-vallecula;Reduced tongue base retraction;Pharyngeal residue - valleculae Pharyngeal -- Pharyngeal- Nectar Straw -- Pharyngeal --  Pharyngeal- Thin Teaspoon Delayed swallow initiation-pyriform sinuses;Reduced epiglottic inversion;Reduced tongue base retraction;Pharyngeal residue - valleculae Pharyngeal -- Pharyngeal- Thin Cup -- Pharyngeal -- Pharyngeal- Thin Straw Delayed  swallow initiation-pyriform sinuses;Reduced epiglottic inversion;Penetration/Aspiration during swallow;Reduced tongue base retraction;Pharyngeal residue - valleculae Pharyngeal Material enters airway, CONTACTS cords and not ejected out Pharyngeal- Puree Reduced tongue base retraction;Pharyngeal residue - valleculae Pharyngeal -- Pharyngeal- Mechanical Soft -- Pharyngeal -- Pharyngeal- Regular Reduced tongue base retraction;Pharyngeal residue - valleculae Pharyngeal -- Pharyngeal- Multi-consistency -- Pharyngeal -- Pharyngeal- Pill -- Pharyngeal -- Pharyngeal Comment --  CHL IP CERVICAL ESOPHAGEAL PHASE 05/30/2018 Cervical Esophageal Phase WFL Pudding Teaspoon -- Pudding Cup -- Honey Teaspoon -- Honey Cup -- Nectar Teaspoon -- Nectar Cup -- Nectar Straw -- Thin Teaspoon -- Thin Cup -- Thin Straw -- Puree -- Mechanical Soft -- Regular -- Multi-consistency -- Pill -- Cervical Esophageal Comment -- Shelly Flatten, MA, CCC-SLP Acute Rehab SLP [phone] [name] 05/30/2018, 11:57 AM                1. Acute Hypoxemic Respiratory Failure s/p Cardiac Arrest, s/p 5 defibrillations, 300mg  Amiodarone with ROSC, CXR with bilateral hilar and basilar opacities, consistent with staphylococcal pneumonia , requiring mechanical ventilation, now status post extubation   - 05/30/2018  -  Remains extubated x 48h. . CPAP QHS. On room air  Plan - pulse ox goal > 88% - wean o2 as able - pulm toilet as able - CPAP QHS t continue  2. Cardiogenic Shock, s/p STEMI with 100% LAD Occlusion with placement of DES, Acute Systolic and Diastolic Heart failure, improving. Setting of cocaine    PLAN  - per cads   3. Acute renal failure, Lactic Acidosis, improving, oliguria, Mixed Acid base disorder     - off CRRT 05/30/18 but renal considerin iHD for few more weeks   -on 05/31/18 CCM to discuss with renal and consider chagne of Rt femo HD cath to IJ   4.  GI prophylaxis  -Continue Pepcid  5. Coagulopathy, elevated INR, likely  related to Shock Liver, Elevated Liver enzymes  Chronic alcoholism, daily drinker, substance abuse history with urine positive for cocaine   8/4 - improving shock liver  plan  -Continue vitamin supplementation  -We will need counseling on substance abuse   6. Prior prolonged QTc, now improved on most recent EKGs   - EKG 8/4 - QTc 450 msec  -Avoid QT prolonging agents  7.  Metabolic encephalopathy,   -  05/30/18 - improving emptional state and calmness in day. STill needing night time precedex   8. Drainage from the right nare, improved 9.  Glycemic control, SSI 10. Displacement left IJ CVC, appearing to be turning upwards toward the right subclavian  -dc'ed Left IJ 05/29/18 11. Thrombocytopenia, multifactorial, sepsis, shock, unknown liver history with chronic ETOH use   - HITT Panel  05/28/18 - negative HITT Panel  DVT PPX: Heparin, SCDs  FAMILY  - Updates:  Wife and sister in law at bedside  DISPO  -ICU due to precedex need for encephalopathy    The patient is critically ill with multiple organ systems failure and requires high complexity decision making for assessment and support, frequent evaluation and titration of therapies, application of advanced monitoring technologies and extensive interpretation of multiple databases.   Critical Care Time devoted to patient care services described in this note is  30  Minutes. This time reflects time of care of this signee Dr Brand Males. This critical care time does not reflect  procedure time, or teaching time or supervisory time of PA/NP/Med student/Med Resident etc but could involve care discussion time    Dr. Brand Males, M.D., [facility].C.P Pulmonary and Critical Care Medicine Staff Physician [place] Pager: [phone], If no answer or between  15:00h - 7:00h: call [phone]  05/30/2018 1:18 PM          Dr. Brand Males, M.D., F.C.C.P Pulmonary and Critical  Care Medicine Staff Physician [place] Pager: [phone], If no answer or between  15:00h - 7:00h: call [phone]  05/30/2018 1:06 PM

## 2018-05-30 NOTE — Progress Notes (Signed)
Modified Barium Swallow Progress Note  Patient Details  Name: Howard Thompson MRN: [id number] Date of Birth: 28-Nov-1965  Today's Date: 05/30/2018  Modified Barium Swallow completed.  Full report located under Chart Review in the Imaging Section.  Brief recommendations include the following:  Clinical Impression   MBS was completed using thin liquids via spoon and straw, nectar thick liquids via cup sip, pureed material and solids.  The patient presented with a mild pharyngeal dysphagia.  His biggest risk factor for aspiration is his current mentation and ability to attend to task.  At times, he held material orally requiring cue to swallow.  This was related to his current level of confusion rather then a true oral dysphagia.  The pharyngeal phase was marked by mildly reduced base of tongue retraction which led to mild vallecula residue across textures.  There was one episode of penetration to the level of the vocal cords given a straw sip of thin liquids following presentation of regular solids. The patient required cue to cough and reswallow that was minimally successful most likely due to lack of effort on the his part due to difficulty fully following commands.   Esophageal sweep revealed it slow to clear with liquid wash being helpful to facilitate clearance.  Recommend continue with a pureed diet with thin liquids.  The patient MUST be sat totally upright for all intake and should eat/drink slowly.  As mentation clears he can most likely be advanced to regular solids.  As stated above his biggest risk factor for aspiration is his current mentation and decreased attention to task  ST will follow for therapeutic diet tolerance and possible diet advancement as mentation hopefully clears.  MD please consider cognitive/liguistic evaluation.   Swallow Evaluation Recommendations       SLP Diet Recommendations: Dysphagia 1 (Puree) solids;Thin liquid   Liquid Administration via: Cup;Straw   Medication  Administration: Whole meds with puree   Supervision: Staff to assist with self feeding   Compensations: Minimize environmental distractions;Slow rate;Small sips/bites   Postural Changes: Seated upright at 90 degrees;Remain semi-upright after after feeds/meals (Comment)   Oral Care Recommendations: Oral care before and after Clifford, MA, CCC-SLP Acute Rehab SLP [phone]  [name] 05/30/2018,11:56 AM

## 2018-05-30 NOTE — Progress Notes (Signed)
[facility] ROUNDING NOTE   Subjective:   Interval History: Extubated 8/2, overall much improved.  UOP 55mL yesterday.  Sister at bedside this AM.  Objective:  Vital signs in last 24 hours:  Temp:  [97.3 F (36.3 C)-98.6 F (37 C)] 98.6 F (37 C) (08/04 0700) Pulse Rate:  [63-94] 81 (08/04 0600) Resp:  [14-26] 26 (08/04 0700) BP: (104-164)/(67-116) 147/116 (08/04 0700) SpO2:  [94 %-100 %] 94 % (08/04 0700) Weight:  [67.3 kg (148 lb 5.9 oz)] 67.3 kg (148 lb 5.9 oz) (08/04 0700)  Weight change: -4.4 kg (-9 lb 11.2 oz) Filed Weights   05/28/18 0500 05/29/18 0400 05/30/18 0700  Weight: 78.3 kg (172 lb 9.9 oz) 71.7 kg (158 lb 1.1 oz) 67.3 kg (148 lb 5.9 oz)   Intake/Output: I/O last 3 completed shifts: In: 881.9 [P.O.:30; I.V.:630.7; Other:10; IV Piggyback:211.2] Out: 9562 [Urine:85; ZHYQM:5784; Stool:150]   Intake/Output this shift:  Total I/O In: 15.9 [I.V.:15.9] Out: 217 [Urine:10; Other:157; Stool:50]  Physical Exam: General- on [place], appears comfortable CVS- RRR, mrg's Resp - coarse anteriorly ABD- BS present soft non-distended EXT- no edema  Basic Metabolic Panel: Recent Labs  Lab 05/26/18 0405 05/27/18 0307 05/27/18 1614 05/27/18 1837 05/28/18 0606 05/28/18 2028 05/29/18 0333 05/30/18 0357  NA 136 139 133* 138 136 139 139 138  K 4.2 4.6 7.3* 4.8 4.3 3.9 5.1 4.7  CL 98 104 102 100 101 102 104 101  CO2 27 27 25 25 26 24 27 26   GLUCOSE 120* 119* 122* 147* 131* 87 88 87  BUN 43* 37* 38* 39* 39* 36* 35* 38*  CREATININE 3.84* 3.29* 3.14* 3.23* 2.98* 2.56* 2.61* 2.74*  CALCIUM 6.9* 7.7* 7.3* 8.0* 8.2* 6.8* 8.0* 8.2*  MG 2.2 2.4  --   --  2.5*  --  2.5* 2.7*  PHOS 3.7 2.4* 3.2  --  2.6 1.7* 2.5 2.5    Liver Function Tests: Recent Labs  Lab 05/26/18 0405 05/27/18 0307 05/27/18 1614 05/28/18 0606 05/28/18 2028 05/29/18 0333 05/30/18 0357  AST 4,085* 1,778*  --  593*  --  263* 130*  ALT 4,620* 3,440*  --  1,854*  --  1,326* 577*  ALKPHOS  131* 221*  --  289*  --  226* 199*  BILITOT 2.1* 2.2*  --  2.3*  --  1.7* 1.4*  PROT 5.2* 5.2*  --  5.6*  --  5.4* 5.7*  ALBUMIN 2.6* 2.6* 2.4* 2.5* 2.7* 2.4* 2.4*   No results for input(s): LIPASE, AMYLASE in the last 168 hours. No results for input(s): AMMONIA in the last 168 hours.  CBC: Recent Labs  Lab 05/26/18 0405 05/27/18 0307 05/28/18 0606 05/29/18 0333 05/30/18 0357  WBC 12.1* 11.8* 10.3 9.3 10.8*  NEUTROABS 11.2* 10.7* 8.9* 7.9* 8.9*  HGB 9.0* 8.9* 9.0* 8.9* 9.5*  HCT 28.6* 28.2* 28.6* 27.9* 29.9*  MCV 86.1 86.2 85.9 86.4 86.2  PLT 77* 65* 54* 73* 90*    Cardiac Enzymes: Recent Labs  Lab 05/23/18 1206 05/23/18 2253 05/28/18 2027 05/29/18 0333 05/29/18 1445  TROPONINI 1.74* 1.99* 0.92* 0.87* 0.66*    BNP: Invalid input(s): POCBNP  CBG: Recent Labs  Lab 05/29/18 0726 05/29/18 1204 05/29/18 1518 05/29/18 2107 05/30/18 0751  GLUCAP 80 80 85 81 30    Microbiology: Results for orders placed or performed during the hospital encounter of 05/22/18  MRSA PCR Screening     Status: None   Collection Time: 05/22/18  8:24 PM  Result Value Ref Range  Status   MRSA by PCR NEGATIVE NEGATIVE Final    Comment:        The GeneXpert MRSA Assay (FDA approved for NASAL specimens only), is one component of a comprehensive MRSA colonization surveillance program. It is not intended to diagnose MRSA infection nor to guide or monitor treatment for MRSA infections. Performed at [facility], [place] [address], [place], [place] [address]   Urine culture     Status: None   Collection Time: 05/22/18  8:35 PM  Result Value Ref Range Status   Specimen Description URINE, RANDOM  Final   Special Requests NONE  Final   Culture   Final    NO GROWTH Performed at [facility], [place] [address], [place], [place] [address]    Report Status 05/24/2018 FINAL  Final  Culture, respiratory (tracheal aspirate)     Status: None   Collection Time: 05/22/18  8:35 PM   Result Value Ref Range Status   Specimen Description TRACHEAL ASPIRATE  Final   Special Requests NONE  Final   Gram Stain   Final    FEW WBC PRESENT, PREDOMINANTLY PMN ABUNDANT GRAM POSITIVE COCCI IN CHAINS IN PAIRS FEW GRAM NEGATIVE RODS FEW YEAST Performed at [facility], [place] [address] [place], [place] [address]    Culture FEW STAPHYLOCOCCUS AUREUS  Final   Report Status 05/28/2018 FINAL  Final   Organism ID, Bacteria STAPHYLOCOCCUS AUREUS  Final      Susceptibility   Staphylococcus aureus - MIC*    CIPROFLOXACIN <=0.5 SENSITIVE Sensitive     ERYTHROMYCIN <=0.25 SENSITIVE Sensitive     GENTAMICIN <=0.5 SENSITIVE Sensitive     OXACILLIN 0.5 SENSITIVE Sensitive     TETRACYCLINE <=1 SENSITIVE Sensitive     VANCOMYCIN <=0.5 SENSITIVE Sensitive     TRIMETH/SULFA <=10 SENSITIVE Sensitive     CLINDAMYCIN <=0.25 SENSITIVE Sensitive     RIFAMPIN <=0.5 SENSITIVE Sensitive     Inducible Clindamycin NEGATIVE Sensitive     * FEW STAPHYLOCOCCUS AUREUS  Culture, blood (routine x 2)     Status: None   Collection Time: 05/22/18  9:57 PM  Result Value Ref Range Status   Specimen Description BLOOD  Final   Special Requests   Final    FROM SHEATH BOTTLES DRAWN AEROBIC AND ANAEROBIC Blood Culture adequate volume   Culture   Final    NO GROWTH 5 DAYS Performed at [facility], [address] [place], [place] [address]    Report Status 05/27/2018 FINAL  Final  Culture, blood (routine x 2)     Status: Abnormal (Preliminary result)   Collection Time: 05/22/18 10:03 PM  Result Value Ref Range Status   Specimen Description BLOOD CENTRAL LINE  Final   Special Requests   Final    BOTTLES DRAWN AEROBIC AND ANAEROBIC Blood Culture adequate volume   Culture  Setup Time   Final    ANAEROBIC BOTTLE ONLY GRAM POSITIVE RODS CRITICAL RESULT CALLED TO, READ BACK BY AND VERIFIED WITH: V BRYK PHARMD 05/27/18 0739 JDW    Culture (A)  Final    PROPIONIBACTERIUM ACNES Standardized  susceptibility testing for this organism is not available. Performed at [facility], [place] [address] [place], [place] [address]    Report Status PENDING  Incomplete   Coagulation Studies: No results for input(s): LABPROT, INR in the last 72 hours. Urinalysis: No results for input(s): COLORURINE, LABSPEC, [place], [place], [place], [place], [place], [place], [place],  NITRITE, LEUKOCYTESUR in the last 72 hours.  Invalid input(s): APPERANCEUR   Imaging: Dg Chest Port 1 View  Result Date: 05/29/2018 CLINICAL DATA:  Acute respiratory failure. EXAM: PORTABLE CHEST 1 VIEW COMPARISON:  05/26/2018 FINDINGS: Endotracheal tube and NG tube is been removed in the interval. The left IJ central line remains in place, crossing the midline and extending cranially towards the thoracic inlet, likely in the right innominate vein directed cranially. Cardiopericardial silhouette is at upper limits of normal for size. Interval progression of bilateral airspace disease with central predominance. The visualized bony structures of the thorax are intact. Telemetry leads overlie the chest. IMPRESSION: 1. Interval progression of central airspace disease suggests edema. 2. Left IJ central line crosses the midline is directed cranially on the right, overlying the expected location of the right innominate vein. Electronically Signed   By: Misty Stanley M.D.   On: 05/29/2018 02:59   Dg Abd Portable 1v  Result Date: 05/29/2018 CLINICAL DATA:  Pain. EXAM: PORTABLE ABDOMEN - 1 VIEW COMPARISON:  None. FINDINGS: Right femoral catheter terminates at the level of the sacrum. No bowel dilatation to suggest obstruction. Slight increased air throughout central small bowel without abnormal distension. No evidence of free air. No radiopaque calculi. Ill-defined patchy bibasilar opacities. Incidental hemi transitional lumbosacral anatomy. IMPRESSION: Nonobstructive bowel gas pattern. Slight increased small bowel gas suggests  mild ileus. Electronically Signed   By: Jeb Levering M.D.   On: 05/29/2018 02:37   Medications:   . sodium chloride 10 mL/hr at 05/27/18 1638  . sodium chloride 10 mL/hr at 05/30/18 0800  . sodium chloride 500 mL (05/27/18 1647)  . dexmedetomidine (PRECEDEX) IV infusion 0.3 mcg/kg/hr (05/30/18 0700)  . dextrose Stopped (05/25/18 0931)  . feeding supplement (VITAL AF 1.2 CAL) Stopped (05/28/18 1130)  . dialysis replacement fluid (prismasate) 500 mL/hr at 05/30/18 0216  . dialysis replacement fluid (prismasate) 500 mL/hr at 05/30/18 0217  . dialysate (PRISMASATE) 1,500 mL/hr at 05/30/18 0502   . sodium chloride   Intravenous Once  . B-complex with vitamin C  1 tablet Per Tube Daily  . Chlorhexidine Gluconate Cloth  6 each Topical Daily  . famotidine  20 mg Oral Daily  . folic acid  1 mg Oral Daily  . heparin  5,000 Units Subcutaneous Q8H  . hydrocortisone sod succinate (SOLU-CORTEF) inj  50 mg Intravenous Daily  . insulin aspart  0-9 Units Subcutaneous Q4H  . mouth rinse  15 mL Mouth Rinse BID  . metoprolol tartrate  5 mg Intravenous Q6H  . multivitamin with minerals  1 tablet Oral Daily  . nitroGLYCERIN  0.5 inch Topical Q6H  . sodium chloride flush  10-40 mL Intracatheter Q12H  . sodium chloride flush  3 mL Intravenous Q12H  . thiamine  100 mg Oral Daily  . ticagrelor  90 mg Per Tube BID  . zinc sulfate  220 mg Oral Daily   sodium chloride, sodium chloride, docusate, heparin, hydrALAZINE, LORazepam, nitroGLYCERIN, sodium chloride flush, sodium chloride flush   Patient Profile: Howard Thompson a 52 y.o.malew/ a PMHx notable CAD prior MI, HTN, myasthenia gravis s/p thymectomy, brought to the ED following sudden cardiac arrest with LHC finding 100% LAD occlusion prompting DES placement with good post procedural flow. Decreased renal output with increased volume in the setting of acidosis and increasing serum creatinine prompting ongoing consideration for CRRT.  Assessment/  Plan:  1.  Severe oliguric AKI:  Secondary to contrast, cardiogenic shock and resultant ATN.  Currently ~anuric but  we are hopeful for renal recovery of the coming weeks.  Tolerating CRRT well but in light of hemodynamic stability will discontinue CRRT today and monitor for needs for iHD.  C spine cleared now, so can move HD line up to chest when able per CCM.      2.  Anemia:  No ESA indicated with AKI.  Care per PCCM.    3.  Acidemia:  Metabolic, present when in shock, resolved.   4.  Thrombocytopenia:  HIT panel negative.  We were not running heparin with CRRT but do have heparin catheter dwell ordered.    Will continue to follow closely with you.  Page with questions, concerns. Jannifer Hick MD

## 2018-05-31 ENCOUNTER — Inpatient Hospital Stay (HOSPITAL_COMMUNITY): Payer: No Typology Code available for payment source

## 2018-05-31 ENCOUNTER — Encounter (HOSPITAL_COMMUNITY): Payer: Self-pay | Admitting: Interventional Radiology

## 2018-05-31 DIAGNOSIS — N179 Acute kidney failure, unspecified: Secondary | ICD-10-CM

## 2018-05-31 DIAGNOSIS — I255 Ischemic cardiomyopathy: Secondary | ICD-10-CM

## 2018-05-31 HISTORY — PX: IR US GUIDE VASC ACCESS RIGHT: IMG2390

## 2018-05-31 HISTORY — PX: IR FLUORO GUIDE CV LINE RIGHT: IMG2283

## 2018-05-31 LAB — BASIC METABOLIC PANEL
Anion gap: 12 (ref 5–15)
Anion gap: 14 (ref 5–15)
BUN: 77 mg/dL — AB (ref 6–20)
BUN: 105 mg/dL — AB (ref 6–20)
CHLORIDE: 105 mmol/L (ref 98–111)
CHLORIDE: 103 mmol/L (ref 98–111)
CO2: 24 mmol/L (ref 22–32)
CO2: 22 mmol/L (ref 22–32)
Calcium: 8.1 mg/dL — ABNORMAL LOW (ref 8.9–10.3)
Calcium: 8.2 mg/dL — ABNORMAL LOW (ref 8.9–10.3)
Creatinine, Ser: 5.6 mg/dL — ABNORMAL HIGH (ref 0.61–1.24)
Creatinine, Ser: 7.2 mg/dL — ABNORMAL HIGH (ref 0.61–1.24)
GFR calc Af Amer: 12 mL/min — ABNORMAL LOW (ref >60)
GFR calc Af Amer: 9 mL/min — ABNORMAL LOW (ref >60)
GFR calc non Af Amer: 11 mL/min — ABNORMAL LOW (ref >60)
GFR calc non Af Amer: 8 mL/min — ABNORMAL LOW (ref >60)
GLUCOSE: 104 mg/dL — AB (ref 70–99)
Glucose, Bld: 104 mg/dL — ABNORMAL HIGH (ref 70–99)
POTASSIUM: 4.8 mmol/L (ref 3.5–5.1)
POTASSIUM: 5.3 mmol/L — AB (ref 3.5–5.1)
SODIUM: 141 mmol/L (ref 135–145)
Sodium: 139 mmol/L (ref 135–145)

## 2018-05-31 LAB — HEPATIC FUNCTION PANEL
ALT: 115 U/L — AB (ref 0–44)
AST: 71 U/L — AB (ref 15–41)
Albumin: 2.4 g/dL — ABNORMAL LOW (ref 3.5–5.0)
Alkaline Phosphatase: 172 U/L — ABNORMAL HIGH (ref 38–126)
BILIRUBIN INDIRECT: 1.2 mg/dL — AB (ref 0.3–0.9)
Bilirubin, Direct: 0.4 mg/dL — ABNORMAL HIGH (ref 0.0–0.2)
TOTAL PROTEIN: 5.6 g/dL — AB (ref 6.5–8.1)
Total Bilirubin: 1.6 mg/dL — ABNORMAL HIGH (ref 0.3–1.2)

## 2018-05-31 LAB — GLUCOSE, CAPILLARY
GLUCOSE-CAPILLARY: 102 mg/dL — AB (ref 70–99)
GLUCOSE-CAPILLARY: 94 mg/dL (ref 70–99)
Glucose-Capillary: 90 mg/dL (ref 70–99)
Glucose-Capillary: 89 mg/dL (ref 70–99)
Glucose-Capillary: 96 mg/dL (ref 70–99)
Glucose-Capillary: 98 mg/dL (ref 70–99)

## 2018-05-31 LAB — CBC WITH DIFFERENTIAL/PLATELET
Basophils Absolute: 0 10*3/uL (ref 0.0–0.1)
Basophils Relative: 0 %
EOS ABS: 0 10*3/uL (ref 0.0–0.7)
Eosinophils Relative: 0 %
HEMATOCRIT: 28.3 % — AB (ref 39.0–52.0)
HEMOGLOBIN: 8.9 g/dL — AB (ref 13.0–17.0)
LYMPHS PCT: 8 %
Lymphs Abs: 0.8 10*3/uL (ref 0.7–4.0)
MCH: 27.4 pg (ref 26.0–34.0)
MCHC: 31.4 g/dL (ref 30.0–36.0)
MCV: 87.1 fL (ref 78.0–100.0)
MONO ABS: 0.8 10*3/uL (ref 0.1–1.0)
Monocytes Relative: 8 %
NEUTROS PCT: 84 %
Neutro Abs: 8.2 10*3/uL — ABNORMAL HIGH (ref 1.7–7.7)
Platelets: 132 10*3/uL — ABNORMAL LOW (ref 150–400)
RBC: 3.25 MIL/uL — AB (ref 4.22–5.81)
RDW: 21.5 % — AB (ref 11.5–15.5)
WBC: 9.8 10*3/uL (ref 4.0–10.5)

## 2018-05-31 LAB — PROTIME-INR
INR: 1.1
Prothrombin Time: 14.1 seconds (ref 11.4–15.2)

## 2018-05-31 LAB — MAGNESIUM: Magnesium: 3.3 mg/dL — ABNORMAL HIGH (ref 1.7–2.4)

## 2018-05-31 LAB — PHOSPHORUS: Phosphorus: 4.3 mg/dL (ref 2.5–4.6)

## 2018-05-31 MED ORDER — HEPARIN SODIUM (PORCINE) 1000 UNIT/ML IJ SOLN
INTRAMUSCULAR | Status: AC
  Filled 2018-05-31: qty 1

## 2018-05-31 MED ORDER — METOPROLOL TARTRATE 25 MG PO TABS
25.0000 mg | ORAL_TABLET | Freq: Two times a day (BID) | ORAL | Status: DC
  Administered 2018-05-31 – 2018-06-04 (×9): 25 mg via ORAL
  Filled 2018-05-31 (×11): qty 1

## 2018-05-31 MED ORDER — FENTANYL CITRATE (PF) 100 MCG/2ML IJ SOLN
INTRAMUSCULAR | Status: AC
  Filled 2018-05-31: qty 2

## 2018-05-31 MED ORDER — FENTANYL CITRATE (PF) 100 MCG/2ML IJ SOLN
INTRAMUSCULAR | Status: AC | PRN
  Administered 2018-05-31 (×2): 25 ug via INTRAVENOUS
  Administered 2018-05-31: 50 ug via INTRAVENOUS

## 2018-05-31 MED ORDER — LIDOCAINE HCL 1 % IJ SOLN
INTRAMUSCULAR | Status: AC | PRN
  Administered 2018-05-31: 10 mL

## 2018-05-31 MED ORDER — MIDAZOLAM HCL 2 MG/2ML IJ SOLN
INTRAMUSCULAR | Status: AC | PRN
  Administered 2018-05-31 (×3): 1 mg via INTRAVENOUS

## 2018-05-31 MED ORDER — ASPIRIN 81 MG PO CHEW
81.0000 mg | CHEWABLE_TABLET | Freq: Every day | ORAL | Status: DC
  Administered 2018-05-31 – 2018-06-04 (×4): 81 mg via ORAL
  Filled 2018-05-31 (×7): qty 1

## 2018-05-31 MED ORDER — MIDAZOLAM HCL 2 MG/2ML IJ SOLN
INTRAMUSCULAR | Status: AC
  Filled 2018-05-31: qty 2

## 2018-05-31 MED ORDER — CEFAZOLIN SODIUM-DEXTROSE 2-4 GM/100ML-% IV SOLN
2.0000 g | Freq: Once | INTRAVENOUS | Status: AC
  Administered 2018-05-31: 2 g via INTRAVENOUS
  Filled 2018-05-31: qty 100

## 2018-05-31 MED ORDER — "THROMBI-PAD 3""X3"" EX PADS"
1.0000 | MEDICATED_PAD | Freq: Once | CUTANEOUS | Status: AC
  Administered 2018-06-01: 1 via TOPICAL
  Filled 2018-05-31: qty 1

## 2018-05-31 MED ORDER — CEFAZOLIN SODIUM-DEXTROSE 2-4 GM/100ML-% IV SOLN
INTRAVENOUS | Status: AC
  Filled 2018-05-31: qty 100

## 2018-05-31 MED ORDER — LIDOCAINE HCL 1 % IJ SOLN
INTRAMUSCULAR | Status: AC
  Filled 2018-05-31: qty 20

## 2018-05-31 MED ORDER — HEPARIN SODIUM (PORCINE) 1000 UNIT/ML IJ SOLN
INTRAMUSCULAR | Status: AC | PRN
  Administered 2018-05-31: 3800 [IU] via INTRAVENOUS

## 2018-05-31 MED ORDER — HEPARIN SODIUM (PORCINE) 5000 UNIT/ML IJ SOLN: 5000.0000 [IU] | Freq: Three times a day (TID) | INTRAMUSCULAR | Status: DC

## 2018-05-31 NOTE — Progress Notes (Signed)
Progress Note  Patient Name: Howard Thompson Date of Encounter: 05/31/2018  Primary Cardiologist: No primary care provider on file.  Subjective   Still confused, but attempts to answer questions appropriately.   Inpatient Medications    Scheduled Meds: . sodium chloride   Intravenous Once  . B-complex with vitamin C  1 tablet Per Tube Daily  . Chlorhexidine Gluconate Cloth  6 each Topical Daily  . famotidine  20 mg Oral Daily  . folic acid  1 mg Oral Daily  . heparin  5,000 Units Subcutaneous Q8H  . hydrocortisone sod succinate (SOLU-CORTEF) inj  50 mg Intravenous Daily  . insulin aspart  0-9 Units Subcutaneous Q4H  . mouth rinse  15 mL Mouth Rinse BID  . metoprolol tartrate  5 mg Intravenous Q6H  . multivitamin with minerals  1 tablet Oral Daily  . sodium chloride flush  10-40 mL Intracatheter Q12H  . sodium chloride flush  3 mL Intravenous Q12H  . thiamine  100 mg Oral Daily  . ticagrelor  90 mg Per Tube BID  . zinc sulfate  220 mg Oral Daily   Continuous Infusions: . sodium chloride 10 mL/hr at 05/27/18 1638  . sodium chloride 10 mL/hr at 05/30/18 1100  . sodium chloride 500 mL (05/27/18 1647)  . dexmedetomidine (PRECEDEX) IV infusion 0.7 mcg/kg/hr (05/31/18 0325)  . dextrose Stopped (05/25/18 0931)  . feeding supplement (VITAL AF 1.2 CAL) Stopped (05/28/18 1130)   PRN Meds: sodium chloride, sodium chloride, docusate, hydrALAZINE, LORazepam, nitroGLYCERIN, sodium chloride flush, sodium chloride flush   Vital Signs    Vitals:   05/31/18 0400 05/31/18 0500 05/31/18 0600 05/31/18 0700  BP: (!) 143/70 128/66 128/64 116/65  Pulse: 83 76 70 60  Resp: (!) 23 20 (!) 21 16  Temp:    (!) 97.4 F (36.3 C)  TempSrc:    Axillary  SpO2: 95% 96% 96% 97%  Weight:  151 lb 3.8 oz (68.6 kg)    Height:        Intake/Output Summary (Last 24 hours) at 05/31/2018 0837 Last data filed at 05/31/2018 0600 Gross per 24 hour  Intake 329.18 ml  Output 0 ml  Net 329.18 ml   Filed  Weights   05/29/18 0400 05/30/18 0700 05/31/18 0500  Weight: 158 lb 1.1 oz (71.7 kg) 148 lb 5.9 oz (67.3 kg) 151 lb 3.8 oz (68.6 kg)    Telemetry    SR - Personally Reviewed  ECG    SR with LVH - Personally Reviewed  Physical Exam   General: Thin older W, male appearing in no acute distress. Head: Normocephalic, atraumatic.  Neck: Supple, no JVD. Lungs:  Resp regular and unlabored, CTA. Heart: RRR, S1, S2, no  murmur; no rub. Abdomen: Soft, non-tender, non-distended with normoactive bowel sounds. Extremities: No clubbing, cyanosis, edema. Distal pedal pulses are 2+ bilaterally. Neuro: Alert but confused, attempts to answer questions appropriately. Moves all extremities spontaneously.  Labs    Chemistry Recent Labs  Lab 05/29/18 0333 05/30/18 0357 05/31/18 0409  NA 139 138 141  K 5.1 4.7 4.8  CL 104 101 105  CO2 27 26 24   GLUCOSE 88 87 104*  BUN 35* 38* 77*  CREATININE 2.61* 2.74* 5.60*  CALCIUM 8.0* 8.2* 8.1*  PROT 5.4* 5.7* 5.6*  ALBUMIN 2.4* 2.4* 2.4*  AST 263* 130* 71*  ALT 1,326* 577* 115*  ALKPHOS 226* 199* 172*  BILITOT 1.7* 1.4* 1.6*  GFRNONAA 27* 25* 11*  GFRAA 31* 29* 12*  ANIONGAP 8 11 12      Hematology Recent Labs  Lab 05/29/18 0333 05/30/18 0357 05/31/18 0409  WBC 9.3 10.8* 9.8  RBC 3.23* 3.47* 3.25*  HGB 8.9* 9.5* 8.9*  HCT 27.9* 29.9* 28.3*  MCV 86.4 86.2 87.1  MCH 27.6 27.4 27.4  MCHC 31.9 31.8 31.4  RDW 19.9* 20.9* 21.5*  PLT 73* 90* 132*    Cardiac Enzymes Recent Labs  Lab 05/28/18 2027 05/29/18 0333 05/29/18 1445  TROPONINI 0.92* 0.87* 0.66*   No results for input(s): TROPIPOC in the last 168 hours.   BNPNo results for input(s): BNP, PROBNP in the last 168 hours.   DDimer No results for input(s): DDIMER in the last 168 hours.    Radiology    Dg Swallowing Func-speech Pathology  Result Date: 05/30/2018 Objective Swallowing Evaluation: Type of Study: Bedside Swallow Evaluation  Patient Details Name: Howard Thompson MRN:  [id number] Date of Birth: [birth year] Today's Date: 05/30/2018 Time: SLP Start Time (ACUTE ONLY): 1115 -SLP Stop Time (ACUTE ONLY): 1138 SLP Time Calculation (min) (ACUTE ONLY): 23 min Past Medical History: No past medical history on file. Past Surgical History: Past Surgical History: Procedure Laterality Date . CORONARY STENT INTERVENTION N/A 05/22/2018  Procedure: CORONARY STENT INTERVENTION;  Surgeon: Lorretta Harp, MD;  Location: [facility];  Service: Cardiovascular;  Laterality: N/A; . LEFT HEART CATH AND CORONARY ANGIOGRAPHY N/A 05/22/2018  Procedure: LEFT HEART CATH AND CORONARY ANGIOGRAPHY;  Surgeon: Lorretta Harp, MD;  Location: [facility];  Service: Cardiovascular;  Laterality: N/A; HPI: Howard Thompson is a 52 y.o. male admitted on 7/27 s/p unwitnessed out of hospital cardiac arrest, VF, unknown downtime, past medical history of coronary artery disease, myasthenia gravis, hypertension.  Per wife, pt has had hx of dysphagia related to MG which is largely resolved.  Pt was intubated from 7/27 - 8/2.  Subjective: The patient was seen in radiology for instrumental exam with nurse present.  Assessment / Plan / Recommendation CHL IP CLINICAL IMPRESSIONS 05/30/2018 Clinical Impression MBS was completed using thin liquids via spoon and straw, nectar thick liquids via cup sip, pureed material and solids.  The patient presented with a mild pharyngeal dysphagia.  His biggest risk factor for aspiration is his current mentation and ability to attend to task.  At times, he held material orally requiring cue to swallow.  This was related to his current level of confusion rather then a true oral dysphagia.  The pharyngeal phase was marked by mildly reduced base of tongue retraction which led to mild vallecula residue across textures.  There was one episode of penetration to the level of the vocal cords given a straw sip of thin liquids following presentation of regular solids. The patient required cue to  cough and reswallow that was minimally successful most likely due to lack of effort on the his part due to difficulty fully following commands.   Esophageal sweep revealed it slow to clear with liquid wash being helpful to facilitate clearance.  Recommend continue with a pureed diet with thin liquids.  The patient MUST be sat totally upright for all intake and should eat/drink slowly.  As mentation clears he can most likely be advanced to regular solids.  As stated above his biggest risk factor for aspiration is his current mentation and decreased attention to task  ST will follow for therapeutic diet tolerance and possible diet advancement as mentation hopefully clears.  MD please consider cognitive/liguistic evaluation. SLP Visit Diagnosis Dysphagia, oropharyngeal phase (R13.12)  Attention and concentration deficit following -- Frontal lobe and executive function deficit following -- Impact on safety and function Mild aspiration risk   CHL IP TREATMENT RECOMMENDATION 05/30/2018 Treatment Recommendations Therapy as outlined in treatment plan below   Prognosis 05/30/2018 Prognosis for Safe Diet Advancement Good Barriers to Reach Goals Cognitive deficits Barriers/Prognosis Comment -- CHL IP DIET RECOMMENDATION 05/30/2018 SLP Diet Recommendations Dysphagia 1 (Puree) solids;Thin liquid Liquid Administration via Cup;Straw Medication Administration Whole meds with puree Compensations Minimize environmental distractions;Slow rate;Small sips/bites Postural Changes Seated upright at 90 degrees;Remain semi-upright after after feeds/meals (Comment)   CHL IP OTHER RECOMMENDATIONS 05/30/2018 Recommended Consults -- Oral Care Recommendations Oral care before and after PO Other Recommendations --   CHL IP FOLLOW UP RECOMMENDATIONS 05/30/2018 Follow up Recommendations Other (comment)   CHL IP FREQUENCY AND DURATION 05/30/2018 Speech Therapy Frequency (ACUTE ONLY) min 2x/week Treatment Duration 2 weeks      CHL IP ORAL PHASE 05/30/2018 Oral Phase  WFL Oral - Pudding Teaspoon -- Oral - Pudding Cup -- Oral - Honey Teaspoon -- Oral - Honey Cup -- Oral - Nectar Teaspoon -- Oral - Nectar Cup -- Oral - Nectar Straw -- Oral - Thin Teaspoon -- Oral - Thin Cup -- Oral - Thin Straw -- Oral - Puree -- Oral - Mech Soft -- Oral - Regular -- Oral - Multi-Consistency -- Oral - Pill -- Oral Phase - Comment --  CHL IP PHARYNGEAL PHASE 05/30/2018 Pharyngeal Phase Impaired Pharyngeal- Pudding Teaspoon -- Pharyngeal -- Pharyngeal- Pudding Cup -- Pharyngeal -- Pharyngeal- Honey Teaspoon -- Pharyngeal -- Pharyngeal- Honey Cup -- Pharyngeal -- Pharyngeal- Nectar Teaspoon -- Pharyngeal -- Pharyngeal- Nectar Cup Delayed swallow initiation-vallecula;Reduced tongue base retraction;Pharyngeal residue - valleculae Pharyngeal -- Pharyngeal- Nectar Straw -- Pharyngeal -- Pharyngeal- Thin Teaspoon Delayed swallow initiation-pyriform sinuses;Reduced epiglottic inversion;Reduced tongue base retraction;Pharyngeal residue - valleculae Pharyngeal -- Pharyngeal- Thin Cup -- Pharyngeal -- Pharyngeal- Thin Straw Delayed swallow initiation-pyriform sinuses;Reduced epiglottic inversion;Penetration/Aspiration during swallow;Reduced tongue base retraction;Pharyngeal residue - valleculae Pharyngeal Material enters airway, CONTACTS cords and not ejected out Pharyngeal- Puree Reduced tongue base retraction;Pharyngeal residue - valleculae Pharyngeal -- Pharyngeal- Mechanical Soft -- Pharyngeal -- Pharyngeal- Regular Reduced tongue base retraction;Pharyngeal residue - valleculae Pharyngeal -- Pharyngeal- Multi-consistency -- Pharyngeal -- Pharyngeal- Pill -- Pharyngeal -- Pharyngeal Comment --  CHL IP CERVICAL ESOPHAGEAL PHASE 05/30/2018 Cervical Esophageal Phase WFL Pudding Teaspoon -- Pudding Cup -- Honey Teaspoon -- Honey Cup -- Nectar Teaspoon -- Nectar Cup -- Nectar Straw -- Thin Teaspoon -- Thin Cup -- Thin Straw -- Puree -- Mechanical Soft -- Regular -- Multi-consistency -- Pill -- Cervical Esophageal  Comment -- Shelly Flatten, MA, CCC-SLP Acute Rehab SLP [phone] [name] 05/30/2018, 11:57 AM               Cardiac Studies         Post-Intervention Diagram          ------------------------------------------------------------------- ECHO Study Conclusions  - Left ventricle: The cavity size was normal. Systolic function was severely reduced. The estimated ejection fraction was in the range of 25% to 30%. Severe diffuse hypokinesis with regional variations. There is akinesis of the apicalanterior and apical myocardium. There is akinesis of the midanteroseptal myocardium. There is severe hypokinesis of the inferoseptal myocardium. Features are consistent with a pseudonormal left ventricular filling pattern, with concomitant abnormal relaxation and increased filling pressure (grade 2 diastolic dysfunction). Doppler parameters are consistent with high ventricular filling pressure. - Aortic valve: A bicuspid morphology cannot be excluded; mildly thickened,  mildly calcified leaflets. There was mild to moderate regurgitation. Regurgitation pressure half-time: 718 ms. - Mitral valve: There was mild to moderate regurgitation. Valve area by pressure half-time: 2.39 cm^2. - Right ventricle: Systolic function was moderately reduced. - Atrial septum: There was increased thickness of the septum, consistent with lipomatous hypertrophy. - Tricuspid valve: There was mild regurgitation. - Pulmonary arteries: PA peak pressure: 33 mm Hg (S). - Inferior vena cava: The vessel was dilated. The respirophasic diameter changes were blunted (<50%), consistent with elevated central venous pressure. - Pericardium, extracardiac: A small, free-flowing pericardial effusion was identified circumferential to the heart. The fluid had no internal echoes.There was no evidence of hemodynamic compromise.  Impressions:  - severe LV dysfunction with  diffuse HK but apica/anteroseptal and apical anterior AK. Recommend definity contrast study to rule out apical thrombus.  Patient Profile     52 y.o. male out of hospital cardiac arrest, prior history of MI several years ago, myasthenia gravis post thymus resection, anterior ST elevation post LAD stent. Extubated on 8/2.   Assessment & Plan    1. Cardiac arrest: Underwent cath with PCI/DES x1 to the pLAD. Plan for DAPT with ASA/Brilinta. ASA has been held in the setting of thrombocytopenia, but platelet count is improving today. Will resume ASA today. Remains off IV heparin without recurrent VT. He has been taking oral meds. Will switch BB to PO dose of metoprolol 25mg  BID. Statin held 2/2 to elevated LFTs.   2. Cardiogenic shock: Had been on both epi and levo, but now maintaining blood pressures without. Has been on BB with stable blood pressure.   3. ICM: EF noted at 25-30%. Switched IV BB to PO today. No room for ARB/ACEi given AKF. No signs of volume overload.   4. Anoxic encephalopathy: EEG was abnormal but repeat now improving. Overall seems to be improving today. Able to answer some questions.   5. Acute renal failure: Nephrology following. Had CRRT yesterday, but plan is to hold today. Possible move HD cath from femoral site so he can begin to work with PT. CR 2.74>>5.60 today. Bladder scan per RN with only about 20 cc.  6. Polysubstance abuse: + for cocaine on admission.   Signed, Reino Bellis, NP  05/31/2018, 8:37 AM  Pager # [phone]   For questions or updates, please contact [place] Please consult [email or web address] for contact info under Cardiology/STEMI.

## 2018-05-31 NOTE — Progress Notes (Signed)
Patient ID: Howard Thompson, male   DOB: May 06, 1966, 52 y.o.   MRN: [id number]  Tunneled dialysis catheter placement.  Risks and benefits discussed with the patient's wife including, but not limited to bleeding, infection, vascular injury, pneumothorax which may require chest tube placement, air embolism or even death All of her questions were answered, she is agreeable to proceed. Consent signed and in chart.

## 2018-05-31 NOTE — Consult Note (Signed)
Chief Complaint: Patient was seen in consultation today for tunneled dialysis catheter placement Chief Complaint  Patient presents with  . Code STEMI   at the request of Dr Alfonso Ellis   Supervising Physician: Arne Cleveland  Patient Status: [facility] - In-pt  History of Present Illness: Howard Thompson is a 52 y.o. male   STEMI DES 7/27 Now on Brilinta-- cannot come off per Dr Martinique Cardiogenic shock AKI Anuric Off CRRT Has right fem temp cath placed by [facility] 7/30 Dr Joelyn Oms needs pt to have permanent access for continued use Dr Martinique waiting for PT evaluation and mobilization  Requesting tunneled cath now   History reviewed. No pertinent past medical history.  Past Surgical History:  Procedure Laterality Date  . CORONARY STENT INTERVENTION N/A 05/22/2018   Procedure: CORONARY STENT INTERVENTION;  Surgeon: Lorretta Harp, MD;  Location: [place] [facility];  Service: Cardiovascular;  Laterality: N/A;  . LEFT HEART CATH AND CORONARY ANGIOGRAPHY N/A 05/22/2018   Procedure: LEFT HEART CATH AND CORONARY ANGIOGRAPHY;  Surgeon: Lorretta Harp, MD;  Location: [place] [facility];  Service: Cardiovascular;  Laterality: N/A;    Allergies: Patient has no known allergies.  Medications: Prior to Admission medications   Medication Sig Start Date End Date Taking? Authorizing Provider  nitroGLYCERIN (NITROSTAT) 0.4 MG SL tablet Place 0.4 mg under the tongue every 5 (five) minutes as needed for chest pain.   Yes [provider]     History reviewed. No pertinent family history.  Social History   Socioeconomic History  . Marital status: Divorced    Spouse name: Not on file  . Number of children: Not on file  . Years of education: Not on file  . Highest education level: Not on file  Occupational History  . Not on file  Social Needs  . Financial resource strain: Not on file  . Food insecurity:    Worry: Not on file    Inability: Not on file  . Transportation  needs:    Medical: Not on file    Non-medical: Not on file  Tobacco Use  . Smoking status: Current Every Day Smoker    Packs/day: 1.00    Types: Cigarettes  . Smokeless tobacco: Current User  Substance and Sexual Activity  . Alcohol use: Not on file  . Drug use: Not on file  . Sexual activity: Not on file  Lifestyle  . Physical activity:    Days per week: Not on file    Minutes per session: Not on file  . Stress: Not on file  Relationships  . Social connections:    Talks on phone: Not on file    Gets together: Not on file    Attends religious service: Not on file    Active member of club or organization: Not on file    Attends meetings of clubs or organizations: Not on file    Relationship status: Not on file  Other Topics Concern  . Not on file  Social History Narrative  . Not on file    Review of Systems: A 12 point ROS discussed and pertinent positives are indicated in the HPI above.  All other systems are negative.  Review of Systems  Constitutional: Positive for activity change and appetite change. Negative for fever.  Respiratory: Negative for shortness of breath.   Musculoskeletal: Positive for gait problem.  Neurological: Positive for weakness.  Psychiatric/Behavioral: Positive for confusion. Negative for behavioral problems.    Vital Signs: BP Marland Kitchen)  114/59   Pulse 61   Temp (!) 97.4 F (36.3 C) (Axillary)   Resp 19   Ht 5\' 10"  (1.778 m)   Wt 151 lb 3.8 oz (68.6 kg)   SpO2 97%   BMI 21.70 kg/m   Physical Exam  Cardiovascular: Normal rate and regular rhythm.  Pulmonary/Chest: Effort normal and breath sounds normal.  Abdominal: Soft. Bowel sounds are normal.  Neurological: He is alert.  Skin: Skin is warm and dry.  Psychiatric:  Await to discuss with wife Will consent with her for tunneled dialysis catheter placement  Nursing note and vitals reviewed.   Imaging: Ct Head Wo Contrast  Result Date: 05/22/2018 CLINICAL DATA:  Status post CPR EXAM:  CT HEAD WITHOUT CONTRAST CT CERVICAL SPINE WITHOUT CONTRAST TECHNIQUE: Multidetector CT imaging of the head and cervical spine was performed following the standard protocol without intravenous contrast. Multiplanar CT image reconstructions of the cervical spine were also generated. COMPARISON:  None. FINDINGS: CT HEAD FINDINGS Brain: No acute intracranial abnormality. Specifically, no hemorrhage, hydrocephalus, mass lesion, acute infarction, or significant intracranial injury. Vascular: No hyperdense vessel or unexpected calcification. Skull: No acute calvarial abnormality. Sinuses/Orbits: Mucosal thickening throughout the paranasal sinuses. Opacified scattered ethmoid air cells. Other: None CT CERVICAL SPINE FINDINGS Alignment: Normal Skull base and vertebrae: No fracture Soft tissues and spinal canal: Prevertebral soft tissues are normal. No epidural or paraspinal hematoma. Disc levels: Slight disc space narrowing at C5-6 and C6-7. Mild degenerative facet disease bilaterally. Upper chest: Mild emphysema in the apices. Other: No acute findings IMPRESSION: No acute intracranial abnormality. No acute bony abnormality in the cervical spine. Electronically Signed   By: Rolm Baptise M.D.   On: 05/22/2018 18:22   Ct Cervical Spine Wo Contrast  Result Date: 05/22/2018 CLINICAL DATA:  Status post CPR EXAM: CT HEAD WITHOUT CONTRAST CT CERVICAL SPINE WITHOUT CONTRAST TECHNIQUE: Multidetector CT imaging of the head and cervical spine was performed following the standard protocol without intravenous contrast. Multiplanar CT image reconstructions of the cervical spine were also generated. COMPARISON:  None. FINDINGS: CT HEAD FINDINGS Brain: No acute intracranial abnormality. Specifically, no hemorrhage, hydrocephalus, mass lesion, acute infarction, or significant intracranial injury. Vascular: No hyperdense vessel or unexpected calcification. Skull: No acute calvarial abnormality. Sinuses/Orbits: Mucosal thickening  throughout the paranasal sinuses. Opacified scattered ethmoid air cells. Other: None CT CERVICAL SPINE FINDINGS Alignment: Normal Skull base and vertebrae: No fracture Soft tissues and spinal canal: Prevertebral soft tissues are normal. No epidural or paraspinal hematoma. Disc levels: Slight disc space narrowing at C5-6 and C6-7. Mild degenerative facet disease bilaterally. Upper chest: Mild emphysema in the apices. Other: No acute findings IMPRESSION: No acute intracranial abnormality. No acute bony abnormality in the cervical spine. Electronically Signed   By: Rolm Baptise M.D.   On: 05/22/2018 18:22   Dg Chest Port 1 View  Result Date: 05/29/2018 CLINICAL DATA:  Acute respiratory failure. EXAM: PORTABLE CHEST 1 VIEW COMPARISON:  05/26/2018 FINDINGS: Endotracheal tube and NG tube is been removed in the interval. The left IJ central line remains in place, crossing the midline and extending cranially towards the thoracic inlet, likely in the right innominate vein directed cranially. Cardiopericardial silhouette is at upper limits of normal for size. Interval progression of bilateral airspace disease with central predominance. The visualized bony structures of the thorax are intact. Telemetry leads overlie the chest. IMPRESSION: 1. Interval progression of central airspace disease suggests edema. 2. Left IJ central line crosses the midline is directed cranially on the right,  overlying the expected location of the right innominate vein. Electronically Signed   By: Misty Stanley M.D.   On: 05/29/2018 02:59   Dg Chest Port 1 View  Result Date: 05/26/2018 CLINICAL DATA:  Endotracheal tube placement. EXAM: PORTABLE CHEST 1 VIEW COMPARISON:  Radiograph of May 24, 2018. FINDINGS: Stable cardiomediastinal silhouette. Endotracheal and nasogastric tubes are unchanged in position. Sternotomy wires are noted. No pneumothorax or pleural effusion is noted. Stable calcified left hilar lymph node is noted. Mild medial  bibasilar opacities are noted which are improved compared to prior exam, suggesting improving atelectasis or infiltrates. Bony thorax is unremarkable. IMPRESSION: Improving bibasilar opacities compared to prior exam. Stable support apparatus. Electronically Signed   By: Marijo Conception, M.D.   On: 05/26/2018 12:16   Dg Chest Port 1 View  Result Date: 05/24/2018 CLINICAL DATA:  ETT present,Displacement of central venous catheter EXAM: PORTABLE CHEST 1 VIEW COMPARISON:  05/23/2018 FINDINGS: Endotracheal tube is in place with tip 2.2 centimeters above the carina. The nasogastric tube is in place, tip overlying the level of the stomach. LEFT IJ central line tip is directed superiorly possibly in the RIGHT brachiocephalic vein and is unchanged. There is a significant opacity in the MEDIAL lung bases bilaterally, RIGHT greater than LEFT and stable in appearance. IMPRESSION: Stable bilateral LOWER lobe infiltrates. LEFT IJ central line remains superiorly directed in the RIGHT superior mediastinum. Electronically Signed   By: Nolon Nations M.D.   On: 05/24/2018 09:07   Dg Chest Port 1 View  Result Date: 05/23/2018 CLINICAL DATA:  Respiratory failure EXAM: PORTABLE CHEST 1 VIEW COMPARISON:  Chest radiograph from one day prior. FINDINGS: Endotracheal tube tip is 5.1 cm above the carina. Left internal jugular central venous catheter loops in the upper mediastinum with the tip oriented superiorly in the high right mediastinum, unchanged. Intact sternotomy wires. Enteric tube enters lower thoracic esophagus with the tip not definitely seen on this image. Stable cardiomediastinal silhouette with normal heart size. No pneumothorax. No pleural effusion. Stable patchy right lung base and left lower parahilar consolidation. IMPRESSION: 1. Stable malpositioned left internal jugular central venous catheter with the tip oriented superiorly in the high right mediastinum. Otherwise well-positioned support structures. 2. Stable  patchy right lung base and left lower parahilar consolidation. Electronically Signed   By: Ilona Sorrel M.D.   On: 05/23/2018 07:39   Dg Chest Port 1 View  Result Date: 05/22/2018 CLINICAL DATA:  Central line placement. EXAM: PORTABLE CHEST 1 VIEW COMPARISON:  May 22, 2018 FINDINGS: An ETT terminates in good position in the mid trachea. A new left central line courses to the right of midline with the tip directed superiorly. Recommend repositioning. The NG tube terminates below today's film. No pneumothorax. Mild left perihilar opacity. More focal opacity in the right medial lung base most suggestive of infiltrate. The heart, hila, and mediastinum are unchanged. IMPRESSION: 1. The new left central line terminates to the right of midline with the tip directed superiorly, possibly in the right brachiocephalic vein. Recommend repositioning. 2. Other support apparatus as above. 3. Focal infiltrate in the medial right lung base favored to represent pneumonia or aspiration. Recommend follow-up to resolution. 4. Left perihilar opacity is nonspecific but may represent infiltrate as well. These results will be called to the ordering clinician or representative by the Radiologist Assistant, and communication documented in the PACS or zVision Dashboard. Electronically Signed   By: Dorise Bullion III M.D   On: 05/22/2018 21:55   Dg Chest  Port 1 View  Result Date: 05/22/2018 CLINICAL DATA:  Endotracheal tube placement EXAM: PORTABLE CHEST 1 VIEW COMPARISON:  None FINDINGS: Endotracheal tube tip is at the level of the clavicular heads. The orogastric tube tip and side port project over the stomach. There are moderate bilateral parahilar opacities. Lungs are otherwise clear. IMPRESSION: 1. Radiographically appropriate positioning of endotracheal and orogastric tubes. 2. Bilateral parahilar opacities may indicate pulmonary edema. Electronically Signed   By: Ulyses Jarred M.D.   On: 05/22/2018 20:35   Dg Abd Portable  1v  Result Date: 05/29/2018 CLINICAL DATA:  Pain. EXAM: PORTABLE ABDOMEN - 1 VIEW COMPARISON:  None. FINDINGS: Right femoral catheter terminates at the level of the sacrum. No bowel dilatation to suggest obstruction. Slight increased air throughout central small bowel without abnormal distension. No evidence of free air. No radiopaque calculi. Ill-defined patchy bibasilar opacities. Incidental hemi transitional lumbosacral anatomy. IMPRESSION: Nonobstructive bowel gas pattern. Slight increased small bowel gas suggests mild ileus. Electronically Signed   By: Jeb Levering M.D.   On: 05/29/2018 02:37   Dg Swallowing Func-speech Pathology  Result Date: 05/30/2018 Objective Swallowing Evaluation: Type of Study: Bedside Swallow Evaluation  Patient Details Name: Howard Thompson MRN: [id number] Date of Birth: November 28, 1965 Today's Date: 05/30/2018 Time: SLP Start Time (ACUTE ONLY): 1115 -SLP Stop Time (ACUTE ONLY): 1138 SLP Time Calculation (min) (ACUTE ONLY): 23 min Past Medical History: No past medical history on file. Past Surgical History: Past Surgical History: Procedure Laterality Date . CORONARY STENT INTERVENTION N/A 05/22/2018  Procedure: CORONARY STENT INTERVENTION;  Surgeon: Lorretta Harp, MD;  Location: [place] [facility];  Service: Cardiovascular;  Laterality: N/A; . LEFT HEART CATH AND CORONARY ANGIOGRAPHY N/A 05/22/2018  Procedure: LEFT HEART CATH AND CORONARY ANGIOGRAPHY;  Surgeon: Lorretta Harp, MD;  Location: [place] [facility];  Service: Cardiovascular;  Laterality: N/A; HPI: Howard Thompson is a 52 y.o. male admitted on 7/27 s/p unwitnessed out of hospital cardiac arrest, VF, unknown downtime, past medical history of coronary artery disease, myasthenia gravis, hypertension.  Per wife, pt has had hx of dysphagia related to MG which is largely resolved.  Pt was intubated from 7/27 - 8/2.  Subjective: The patient was seen in radiology for instrumental exam with nurse present.  Assessment / Plan /  Recommendation CHL IP CLINICAL IMPRESSIONS 05/30/2018 Clinical Impression MBS was completed using thin liquids via spoon and straw, nectar thick liquids via cup sip, pureed material and solids.  The patient presented with a mild pharyngeal dysphagia.  His biggest risk factor for aspiration is his current mentation and ability to attend to task.  At times, he held material orally requiring cue to swallow.  This was related to his current level of confusion rather then a true oral dysphagia.  The pharyngeal phase was marked by mildly reduced base of tongue retraction which led to mild vallecula residue across textures.  There was one episode of penetration to the level of the vocal cords given a straw sip of thin liquids following presentation of regular solids. The patient required cue to cough and reswallow that was minimally successful most likely due to lack of effort on the his part due to difficulty fully following commands.   Esophageal sweep revealed it slow to clear with liquid wash being helpful to facilitate clearance.  Recommend continue with a pureed diet with thin liquids.  The patient MUST be sat totally upright for all intake and should eat/drink slowly.  As mentation clears he can most likely  be advanced to regular solids.  As stated above his biggest risk factor for aspiration is his current mentation and decreased attention to task  ST will follow for therapeutic diet tolerance and possible diet advancement as mentation hopefully clears.  MD please consider cognitive/liguistic evaluation. SLP Visit Diagnosis Dysphagia, oropharyngeal phase (R13.12) Attention and concentration deficit following -- Frontal lobe and executive function deficit following -- Impact on safety and function Mild aspiration risk   CHL IP TREATMENT RECOMMENDATION 05/30/2018 Treatment Recommendations Therapy as outlined in treatment plan below   Prognosis 05/30/2018 Prognosis for Safe Diet Advancement Good Barriers to Reach Goals  Cognitive deficits Barriers/Prognosis Comment -- CHL IP DIET RECOMMENDATION 05/30/2018 SLP Diet Recommendations Dysphagia 1 (Puree) solids;Thin liquid Liquid Administration via Cup;Straw Medication Administration Whole meds with puree Compensations Minimize environmental distractions;Slow rate;Small sips/bites Postural Changes Seated upright at 90 degrees;Remain semi-upright after after feeds/meals (Comment)   CHL IP OTHER RECOMMENDATIONS 05/30/2018 Recommended Consults -- Oral Care Recommendations Oral care before and after PO Other Recommendations --   CHL IP FOLLOW UP RECOMMENDATIONS 05/30/2018 Follow up Recommendations Other (comment)   CHL IP FREQUENCY AND DURATION 05/30/2018 Speech Therapy Frequency (ACUTE ONLY) min 2x/week Treatment Duration 2 weeks      CHL IP ORAL PHASE 05/30/2018 Oral Phase WFL Oral - Pudding Teaspoon -- Oral - Pudding Cup -- Oral - Honey Teaspoon -- Oral - Honey Cup -- Oral - Nectar Teaspoon -- Oral - Nectar Cup -- Oral - Nectar Straw -- Oral - Thin Teaspoon -- Oral - Thin Cup -- Oral - Thin Straw -- Oral - Puree -- Oral - Mech Soft -- Oral - Regular -- Oral - Multi-Consistency -- Oral - Pill -- Oral Phase - Comment --  CHL IP PHARYNGEAL PHASE 05/30/2018 Pharyngeal Phase Impaired Pharyngeal- Pudding Teaspoon -- Pharyngeal -- Pharyngeal- Pudding Cup -- Pharyngeal -- Pharyngeal- Honey Teaspoon -- Pharyngeal -- Pharyngeal- Honey Cup -- Pharyngeal -- Pharyngeal- Nectar Teaspoon -- Pharyngeal -- Pharyngeal- Nectar Cup Delayed swallow initiation-vallecula;Reduced tongue base retraction;Pharyngeal residue - valleculae Pharyngeal -- Pharyngeal- Nectar Straw -- Pharyngeal -- Pharyngeal- Thin Teaspoon Delayed swallow initiation-pyriform sinuses;Reduced epiglottic inversion;Reduced tongue base retraction;Pharyngeal residue - valleculae Pharyngeal -- Pharyngeal- Thin Cup -- Pharyngeal -- Pharyngeal- Thin Straw Delayed swallow initiation-pyriform sinuses;Reduced epiglottic inversion;Penetration/Aspiration during  swallow;Reduced tongue base retraction;Pharyngeal residue - valleculae Pharyngeal Material enters airway, CONTACTS cords and not ejected out Pharyngeal- Puree Reduced tongue base retraction;Pharyngeal residue - valleculae Pharyngeal -- Pharyngeal- Mechanical Soft -- Pharyngeal -- Pharyngeal- Regular Reduced tongue base retraction;Pharyngeal residue - valleculae Pharyngeal -- Pharyngeal- Multi-consistency -- Pharyngeal -- Pharyngeal- Pill -- Pharyngeal -- Pharyngeal Comment --  CHL IP CERVICAL ESOPHAGEAL PHASE 05/30/2018 Cervical Esophageal Phase WFL Pudding Teaspoon -- Pudding Cup -- Honey Teaspoon -- Honey Cup -- Nectar Teaspoon -- Nectar Cup -- Nectar Straw -- Thin Teaspoon -- Thin Cup -- Thin Straw -- Puree -- Mechanical Soft -- Regular -- Multi-consistency -- Pill -- Cervical Esophageal Comment -- Shelly Flatten, MA, CCC-SLP Acute Rehab SLP [phone] [name] 05/30/2018, 11:57 AM               Labs:  CBC: Recent Labs    05/28/18 0606 05/29/18 0333 05/30/18 0357 05/31/18 0409  WBC 10.3 9.3 10.8* 9.8  HGB 9.0* 8.9* 9.5* 8.9*  HCT 28.6* 27.9* 29.9* 28.3*  PLT 54* 73* 90* 132*    COAGS: Recent Labs    05/22/18 1744 05/22/18 2027 05/23/18 0439 05/24/18 1010 05/25/18 0409  INR 1.07 5.53* 1.55 3.87 2.25  APTT 30 122*  59*  --  43*    BMP: Recent Labs    05/28/18 2028 05/29/18 0333 05/30/18 0357 05/31/18 0409  NA 139 139 138 141  K 3.9 5.1 4.7 4.8  CL 102 104 101 105  CO2 24 27 26 24   GLUCOSE 87 88 87 104*  BUN 36* 35* 38* 77*  CALCIUM 6.8* 8.0* 8.2* 8.1*  CREATININE 2.56* 2.61* 2.74* 5.60*  GFRNONAA 27* 27* 25* 11*  GFRAA 32* 31* 29* 12*    LIVER FUNCTION TESTS: Recent Labs    05/28/18 0606 05/28/18 2028 05/29/18 0333 05/30/18 0357 05/31/18 0409  BILITOT 2.3*  --  1.7* 1.4* 1.6*  AST 593*  --  263* 130* 71*  ALT 1,854*  --  1,326* 577* 115*  ALKPHOS 289*  --  226* 199* 172*  PROT 5.6*  --  5.4* 5.7* 5.6*  ALBUMIN 2.5* 2.7* 2.4* 2.4* 2.4*    TUMOR  MARKERS: No results for input(s): AFPTM, CEA, CA199, CHROMGRNA in the last 8760 hours.  Assessment and Plan:  STEMI; DES 05/22/18 AKI; Anuric Temp Rt fem catheter placed 7/30 Need tunneled cath for continued use per Nephrology Cardiology awaiting PT eval and mobilization Scheduled for tunneled dialysis catheter today Hep inj held; npo Checking INR Pt is on Brilinta--- cannot come off per Dr Martinique  Will discuss and consent with wife   Thank you for this interesting consult.  I greatly enjoyed meeting Howard Thompson and look forward to participating in their care.  A copy of this report was sent to the requesting provider on this date.  Electronically Signed: Lavonia Drafts, PA-C 05/31/2018, 9:40 AM   I spent a total of 20 Minutes    in face to face in clinical consultation, greater than 50% of which was counseling/coordinating care for tunneled dialysis catheter placement

## 2018-05-31 NOTE — Progress Notes (Signed)
Admit: 05/22/2018 LOS: 9  69M ATN 2/2 CIN, cardiogenic shock; admit with cardiac arrest / STEMI to LAD req PCI; Off CRRT 05/30/18  Subjective:  . Awake, not entirely cogent, not like bfore the arrest  . Off CRRT, sig inc in SCr and BUN, K 4.8 . Remains Anuric . HD Cath: Femoral Temp Cath . No foley, doing PVRs, all < 100  08/04 0701 - 08/05 0700 In: 495.1 [I.V.:345.1] Out: 217 [Urine:10; Stool:50]  Filed Weights   05/29/18 0400 05/30/18 0700 05/31/18 0500  Weight: 71.7 kg (158 lb 1.1 oz) 67.3 kg (148 lb 5.9 oz) 68.6 kg (151 lb 3.8 oz)    Scheduled Meds: . sodium chloride   Intravenous Once  . B-complex with vitamin C  1 tablet Per Tube Daily  . Chlorhexidine Gluconate Cloth  6 each Topical Daily  . famotidine  20 mg Oral Daily  . folic acid  1 mg Oral Daily  . heparin  5,000 Units Subcutaneous Q8H  . hydrocortisone sod succinate (SOLU-CORTEF) inj  50 mg Intravenous Daily  . insulin aspart  0-9 Units Subcutaneous Q4H  . mouth rinse  15 mL Mouth Rinse BID  . metoprolol tartrate  5 mg Intravenous Q6H  . multivitamin with minerals  1 tablet Oral Daily  . sodium chloride flush  10-40 mL Intracatheter Q12H  . sodium chloride flush  3 mL Intravenous Q12H  . thiamine  100 mg Oral Daily  . ticagrelor  90 mg Per Tube BID  . zinc sulfate  220 mg Oral Daily   Continuous Infusions: . sodium chloride 10 mL/hr at 05/27/18 1638  . sodium chloride 10 mL/hr at 05/30/18 1100  . sodium chloride 500 mL (05/27/18 1647)  . dexmedetomidine (PRECEDEX) IV infusion 0.7 mcg/kg/hr (05/31/18 0325)  . dextrose Stopped (05/25/18 0931)  . feeding supplement (VITAL AF 1.2 CAL) Stopped (05/28/18 1130)   PRN Meds:.sodium chloride, sodium chloride, docusate, hydrALAZINE, LORazepam, nitroGLYCERIN, sodium chloride flush, sodium chloride flush  Current Labs: reviewed   Physical Exam:  Blood pressure 116/65, pulse 60, temperature (!) 97.4 F (36.3 C), temperature source Axillary, resp. rate 16, height 5\' 10"   (1.778 m), weight 68.6 kg (151 lb 3.8 oz), SpO2 97 %. NAD Confused RRR CTAB No sig LEE  A 1. Anuric AKI 2/2 ATN from CIN and Cardiogenic Shock, Off CRRT 05/30/18 2. STEMI at LAD with PCI 3. Cardiogenic Shock 2/2 #2, resolved of pressors / inotropes 4. Anemia, not on ESA, per primary 5. TCP, improving; HIT neg 6. VDRF, extubated on RA    P . No HD today . Reassess in AM, likely to require tomorrow . PM labs . Need permanent access, has femoral temp cath; IR consult today . Medication Issues; o Preferred narcotic agents for pain control are hydromorphone, fentanyl, and methadone. Morphine should not be used.  o Baclofen should be avoided o Avoid oral sodium phosphate and magnesium citrate based laxatives / bowel preps    Pearson Grippe MD 05/31/2018, 8:13 AM  Recent Labs  Lab 05/29/18 0333 05/30/18 0357 05/31/18 0409  NA 139 138 141  K 5.1 4.7 4.8  CL 104 101 105  CO2 27 26 24   GLUCOSE 88 87 104*  BUN 35* 38* 77*  CREATININE 2.61* 2.74* 5.60*  CALCIUM 8.0* 8.2* 8.1*  PHOS 2.5 2.5 4.3   Recent Labs  Lab 05/29/18 0333 05/30/18 0357 05/31/18 0409  WBC 9.3 10.8* 9.8  NEUTROABS 7.9* 8.9* 8.2*  HGB 8.9* 9.5* 8.9*  HCT 27.9* 29.9*  28.3*  MCV 86.4 86.2 87.1  PLT 73* 90* 132*

## 2018-05-31 NOTE — Progress Notes (Signed)
SLP Cancellation Note  Patient Details Name: Cloud Graham MRN: [id number] DOB: 1966-05-10   Cancelled treatment:       Reason Eval/Treat Not Completed: Other (comment) Per chart, pt NPO pending procedure today. Will f/u as able.   Germain Osgood 05/31/2018, 10:52 AM  Germain Osgood, M.A. CCC-SLP [phone]

## 2018-05-31 NOTE — Procedures (Signed)
  Procedure: R IJ tunneled HD catheter to svc/ra jct, ok to use EBL:   minimal Complications:  none immediate  See full dictation in [facility].  Dillard Cannon MD Main # [phone] Pager  [phone]

## 2018-05-31 NOTE — Progress Notes (Signed)
Nutrition Follow-up  DOCUMENTATION CODES:   Not applicable  INTERVENTION:    Add Ensure Enlive po BID once able, each supplement provides 350 kcal and 20 grams of protein  If PO intake does not improve, rec Cortrak 10 F feeding tube placement  NUTRITION DIAGNOSIS:   Increased nutrient needs related to (AKI requiring IHD) as evidenced by estimated needs, ongoing  GOAL:   Patient will meet greater than or equal to 90% of their needs, umnet  MONITOR:   Weight trends, Labs, Diet advancement, Vent status, TF tolerance, I & O's  ASSESSMENT:   Patient with PMH significant for CAD with prior MI, HTN, cocaine abuse, and myasthenia gravis s/p thymectomy. Presents this admission with after being found next to his mailbox unresponsive, by stander CPR was initiated. Admitted for cardiac arrest presumed in the setting of ST elevation MI and 100% LAD lesion.   Pt extubated 8/2.  TF (Vital AF 1.2) discontinued via OGT. S/p MBSS 8/4. SLP recommending Dysphagia 1-thin liquids. Pt is lethargic. He is currently NPO for procedure (tunneled catheter).  RD briefly spoke with pt's wife. Reports pt was taking bites prior to NPO. Nephrology note 8/5 reviewed. Pt now off CRRT. Switching to IHD. Labs and medications reviewed. Mg 3.3 (H). BUN 77 (H).  CBG's [phone].  EDW is 68 kg.  Diet Order:   Diet Order           Diet NPO time specified Except for: Sips with Meds  Diet effective now         EDUCATION NEEDS:   Not appropriate for education at this time  Skin:  Skin Assessment: Reviewed RN Assessment  Last BM:  8/4    Intake/Output Summary (Last 24 hours) at 05/31/2018 1332 Last data filed at 05/31/2018 0600 Gross per 24 hour  Intake 249.82 ml  Output 0 ml  Net 249.82 ml   Height:   Ht Readings from Last 1 Encounters:  05/29/18 5\' 10"  (1.778 m)   Weight:   Wt Readings from Last 1 Encounters:  05/31/18 151 lb 3.8 oz (68.6 kg)   Ideal Body Weight:  75.5 kg  BMI:  Body mass  index is 21.7 kg/m.  Estimated Nutritional Needs:   Kcal:  1900-2100  Protein:  102-122 grams (1.5-1.8 g/kg using dry wt)  Fluid:  per MD  Arthur Holms, RD, LDN Pager #: [phone] After-Hours Pager #: [phone]

## 2018-05-31 NOTE — Progress Notes (Signed)
OT Cancellation Note  Patient Details Name: Howard Thompson MRN: [id number] DOB: 1965-12-31   Cancelled Treatment:    Reason Eval/Treat Not Completed: Medical issues which prohibited therapy. temporary fem HD cath. Will return as schedule allows and pt medically ready. Thank you.   [place], OTR/L Acute Rehab Pager: [phone] Office: [phone] 05/31/2018, 7:47 AM

## 2018-05-31 NOTE — Care Management (Signed)
05-31-18  BENEFITS CHECK : S/W MELISSA @ [facility] # [phone]  1.BRILINTA  90 MG BID  ( TICAGRELOR ) COVER- NONE FORMULARY PRIOR APPROVAL- YES # [phone]   2.CLOPIDOGREL 75 MG BID COVER- YES CO-PAY- $ 11.73 TIER- 1 DRUG PRIOR APPROVAL- NO  PREFERRED PHARMACY : YES   [facility]

## 2018-05-31 NOTE — Progress Notes (Addendum)
Jacqualin Combes, RN        # 4.  S/W MELISSA @ [facility] # [phone]    BRILINTA 90 MG BID  COVER- NONE FORMULARY  PRIOR APPROVAL- YES # [phone]    CLOPIDOGREL 75 MG BID  COVER- YES  CO-PAY- $ 11.73  TIER- 1 DRUG  PRIOR APPROVAL- NO   PREFERRED PHARMACY : YES [facility]    06/01/18 10:50 Notified Eugene Garnet NP that prior Josem Kaufmann is needed. Provided coupon card to daughter at bedside.

## 2018-05-31 NOTE — Progress Notes (Signed)
PULMONARY / CRITICAL CARE MEDICINE   Name: Howard Thompson MRN: [id number] DOB: 02/21/1966    ADMISSION DATE:  05/22/2018 CONSULTATION DATE:  05/22/18       HISTORY OF PRESENT ILLNESS:    Howard Thompson, 52 y.o. male admitted on 05/22/2018 s/p unwitnessed out of hospital cardiac arrest, VF, unknown downtime, past medical history of coronary artery disease, myasthenia gravis, hypertension.  Per the electronic medical record the patient received 5 defibrillations and given 300 mg amiodarone.  Upon arrival the patient was found to have a STEMI and was taken to the Cath Lab where a DES was placed and a 100% LAD lesion.  Post catheter patient was taken for therapeutic hypothermia to the intensive care unit.  Per the initial critical care documentation there was concern for posturing.  EKGs initially with a prolonged QT at 560, now improved.  He was initially placed in a c-collar, presenting head CT and CT cervical spine negative for acute abnormalities.   05/31/18 The patient is awake. Respiratory status appears ok. No major complaints. SLP has approved him for a dysphagia 1 diet. He is moving all extremties. He will follow simple commands. He will be switching ot IHD from CRRT. He is having his catheter changed from femoral to subclavian today with the IR service.   STUDIES:  LHC 05/22/2018 by Dr. Harriet [place] LAD to Prox LAD lesion is 100% stenosed.  A drug-eluting stent was successfully placed using a STENT SYNERGY DES 3X20.  Post intervention, there is a 0% residual stenosis.  There is severe left ventricular systolic dysfunction.  LV end diastolic pressure is mildly elevated.  The left ventricular ejection fraction is less than 25% by visual estimate.  ECHO 05/23/2018: EF 20-25%, Grade 2 Diastolic dysfunction  Regional wall motion abnormalities     PAST MEDICAL HISTORY :  He  has no past medical history on file.  PAST SURGICAL HISTORY: He  has a past surgical history that includes  LEFT HEART CATH AND CORONARY ANGIOGRAPHY (N/A, 05/22/2018) and CORONARY STENT INTERVENTION (N/A, 05/22/2018).  No Known Allergies  No current facility-administered medications on file prior to encounter.    Current Outpatient Medications on File Prior to Encounter  Medication Sig  . nitroGLYCERIN (NITROSTAT) 0.4 MG SL tablet Place 0.4 mg under the tongue every 5 (five) minutes as needed for chest pain.    FAMILY HISTORY:  His family history is not on file.  SOCIAL HISTORY: He  reports that he has been smoking cigarettes.  He has been smoking about 1.00 pack per day. He uses smokeless tobacco.      VITAL SIGNS: BP 118/62   Pulse 67   Temp (!) 97.2 F (36.2 C) (Axillary)   Resp 17   Ht 5\' 10"  (1.778 m)   Wt 151 lb 3.8 oz (68.6 kg)   SpO2 94%   BMI 21.70 kg/m            INTAKE / OUTPUT: I/O last 3 completed shifts: In: 685.4 [I.V.:535.4; Other:150] Out: 4270 [Urine:10; Other:1392; Stool:50]  PHYSICAL EXAMINATION:   General Appearance:    Looks very deconditioned.His level of alertness vacillates. He is sometimes up more at night  Head:    Normocephalic, without obvious abnormality, atraumatic  Eyes:    PERRL - yes, conjunctiva/corneas - clear      Ears:    Normal external ear canals, both ears          Neck:   Supple,  No enlargement/tenderness/nodules  Lungs:     Clear to auscultation bilaterally,  Chest wall:    No deformity  Heart:    S1 and S2 normal, no murmur, CVP - no.  Pressors - no  Abdomen:     Soft, no masses, no organomegaly         Extremities:  no swelling      LABS:  BMET Recent Labs  Lab 05/29/18 0333 05/30/18 0357 05/31/18 0409  NA 139 138 141  K 5.1 4.7 4.8  CL 104 101 105  CO2 27 26 24   BUN 35* 38* 77*  CREATININE 2.61* 2.74* 5.60*  GLUCOSE 88 87 104*    Electrolytes Recent Labs  Lab 05/29/18 0333 05/30/18 0357 05/31/18 0409  CALCIUM 8.0* 8.2* 8.1*  MG 2.5* 2.7* 3.3*  PHOS 2.5 2.5 4.3    CBC Recent Labs   Lab 05/29/18 0333 05/30/18 0357 05/31/18 0409  WBC 9.3 10.8* 9.8  HGB 8.9* 9.5* 8.9*  HCT 27.9* 29.9* 28.3*  PLT 73* 90* 132*    Coag's Recent Labs  Lab 05/25/18 0409 05/31/18 1003  APTT 43*  --   INR 2.25 1.10    Sepsis Markers Recent Labs  Lab 05/24/18 1756 05/29/18 2332  LATICACIDVEN 5.2* 1.3    ABG Recent Labs  Lab 05/26/18 0516 05/27/18 0745 05/29/18 0334  PHART 7.409 7.437 7.446  PCO2ART 41.9 39.4 36.2  PO2ART 93.0 154.0* 85.0    Liver Enzymes Recent Labs  Lab 05/29/18 0333 05/30/18 0357 05/31/18 0409  AST 263* 130* 71*  ALT 1,326* 577* 115*  ALKPHOS 226* 199* 172*  BILITOT 1.7* 1.4* 1.6*  ALBUMIN 2.4* 2.4* 2.4*    Cardiac Enzymes Recent Labs  Lab 05/28/18 2027 05/29/18 0333 05/29/18 1445  TROPONINI 0.92* 0.87* 0.66*    Glucose Recent Labs  Lab 05/30/18 1254 05/30/18 1601 05/30/18 1957 05/30/18 2330 05/31/18 0750 05/31/18 1146  GLUCAP 99 102* 94 102* 90 89         DISCUSSION:  The patient has made great strides. When I last saw him over 1 week ago he was in profound shock with massive presspr requirements. He may be able to leave the IVUnenvironment shortly.  ASSESSMENT / PLAN:  PULMONARY  CXR 8/3 appeared a little congested . Will recheck it after the line has been placed. His respiratory effort is gooid and 02 needs minimal ( nasal cannula).  CARDIOVASCULAR  s/p DES stent to the LAD On 7/28 his LVEFwas 25-30% with significant wallmotionabnoramlites.it proabbly bears repeating t=in the next 1-2 weeks.  RENAL Has not shown signs of renal recovery yet. Will be transitionong to IHD tomorrow   Nutrition patient is weak. He is on dysphagia 1 diet. SLPto follow.         Micheal Likens MD Pulmonary and [place] Pager: [phone] Cell [phone]  05/31/2018, 12:30 PM

## 2018-05-31 NOTE — Progress Notes (Signed)
PT Cancellation Note  Patient Details Name: Howard Thompson MRN: [id number] DOB: 12-Oct-1966   Cancelled Treatment:    Reason Eval/Treat Not Completed: Medical issues which prohibited therapy(temporary fem HD cath)   Sandy Salaam Shakiera Edelson 05/31/2018, 7:05 AM Elwyn Reach, [place]

## 2018-05-31 NOTE — Sedation Documentation (Addendum)
Patient transported on Precedex gtt. Floor RN notified to properly administer drip.Floor RN arrived to IR around 1410 and d/c precedex gtt and returned to [facility]

## 2018-05-31 NOTE — Sedation Documentation (Signed)
Patient is mouth breathing. ETCO2 displaying erroneous data at times. MD aware.

## 2018-05-31 NOTE — Progress Notes (Signed)
Pt transported to IR for tunneled cath placement. VS monitored, O2 tank taken. No problems in transport.

## 2018-06-01 ENCOUNTER — Other Ambulatory Visit: Payer: Self-pay

## 2018-06-01 DIAGNOSIS — D500 Iron deficiency anemia secondary to blood loss (chronic): Secondary | ICD-10-CM

## 2018-06-01 DIAGNOSIS — R58 Hemorrhage, not elsewhere classified: Secondary | ICD-10-CM

## 2018-06-01 LAB — POCT I-STAT 3, ART BLOOD GAS (G3+)
Acid-base deficit: 6 mmol/L — ABNORMAL HIGH (ref 0.0–2.0)
BICARBONATE: 19.3 mmol/L — AB (ref 20.0–28.0)
O2 Saturation: 98 %
PCO2 ART: 35.1 mmHg (ref 32.0–48.0)
PO2 ART: 102 mmHg (ref 83.0–108.0)
Patient temperature: 98.6
TCO2: 20 mmol/L — AB (ref 22–32)
pH, Arterial: 7.348 — ABNORMAL LOW (ref 7.350–7.450)

## 2018-06-01 LAB — PROTIME-INR
INR: 1.14
INR: 1.18
Prothrombin Time: 14.5 seconds (ref 11.4–15.2)
Prothrombin Time: 14.9 seconds (ref 11.4–15.2)

## 2018-06-01 LAB — CBC
HEMATOCRIT: 24 % — AB (ref 39.0–52.0)
Hemoglobin: 7.6 g/dL — ABNORMAL LOW (ref 13.0–17.0)
MCH: 27.4 pg (ref 26.0–34.0)
MCHC: 31.7 g/dL (ref 30.0–36.0)
MCV: 86.6 fL (ref 78.0–100.0)
PLATELETS: 147 10*3/uL — AB (ref 150–400)
RBC: 2.77 MIL/uL — AB (ref 4.22–5.81)
RDW: 21.4 % — AB (ref 11.5–15.5)
WBC: 13.7 10*3/uL — AB (ref 4.0–10.5)

## 2018-06-01 LAB — CBC WITH DIFFERENTIAL/PLATELET
BASOS ABS: 0 10*3/uL (ref 0.0–0.1)
BASOS PCT: 0 %
EOS ABS: 0 10*3/uL (ref 0.0–0.7)
Eosinophils Relative: 0 %
HCT: 28.8 % — ABNORMAL LOW (ref 39.0–52.0)
Hemoglobin: 9.1 g/dL — ABNORMAL LOW (ref 13.0–17.0)
Lymphocytes Relative: 7 %
Lymphs Abs: 1 10*3/uL (ref 0.7–4.0)
MCH: 27.2 pg (ref 26.0–34.0)
MCHC: 31.6 g/dL (ref 30.0–36.0)
MCV: 86 fL (ref 78.0–100.0)
MONO ABS: 0.8 10*3/uL (ref 0.1–1.0)
Monocytes Relative: 6 %
NEUTROS PCT: 87 %
Neutro Abs: 12.3 10*3/uL — ABNORMAL HIGH (ref 1.7–7.7)
PLATELETS: 152 10*3/uL (ref 150–400)
RBC: 3.35 MIL/uL — ABNORMAL LOW (ref 4.22–5.81)
RDW: 21.7 % — ABNORMAL HIGH (ref 11.5–15.5)
WBC: 14.1 10*3/uL — ABNORMAL HIGH (ref 4.0–10.5)

## 2018-06-01 LAB — HEPATIC FUNCTION PANEL
ALK PHOS: 150 U/L — AB (ref 38–126)
ALT: 26 U/L (ref 0–44)
AST: 52 U/L — ABNORMAL HIGH (ref 15–41)
Albumin: 2.4 g/dL — ABNORMAL LOW (ref 3.5–5.0)
BILIRUBIN INDIRECT: 0.9 mg/dL (ref 0.3–0.9)
BILIRUBIN TOTAL: 1.2 mg/dL (ref 0.3–1.2)
Bilirubin, Direct: 0.3 mg/dL — ABNORMAL HIGH (ref 0.0–0.2)
TOTAL PROTEIN: 5.6 g/dL — AB (ref 6.5–8.1)

## 2018-06-01 LAB — BASIC METABOLIC PANEL
Anion gap: 17 — ABNORMAL HIGH (ref 5–15)
BUN: 119 mg/dL — ABNORMAL HIGH (ref 6–20)
CALCIUM: 8.3 mg/dL — AB (ref 8.9–10.3)
CO2: 20 mmol/L — AB (ref 22–32)
CREATININE: 8.07 mg/dL — AB (ref 0.61–1.24)
Chloride: 103 mmol/L (ref 98–111)
GFR calc Af Amer: 8 mL/min — ABNORMAL LOW (ref >60)
GFR calc non Af Amer: 7 mL/min — ABNORMAL LOW (ref >60)
GLUCOSE: 108 mg/dL — AB (ref 70–99)
Potassium: 5.5 mmol/L — ABNORMAL HIGH (ref 3.5–5.1)
Sodium: 140 mmol/L (ref 135–145)

## 2018-06-01 LAB — PHOSPHORUS: PHOSPHORUS: 6.8 mg/dL — AB (ref 2.5–4.6)

## 2018-06-01 LAB — GLUCOSE, CAPILLARY
GLUCOSE-CAPILLARY: 123 mg/dL — AB (ref 70–99)
Glucose-Capillary: 101 mg/dL — ABNORMAL HIGH (ref 70–99)
Glucose-Capillary: 93 mg/dL (ref 70–99)
Glucose-Capillary: 75 mg/dL (ref 70–99)

## 2018-06-01 LAB — MAGNESIUM: MAGNESIUM: 3.6 mg/dL — AB (ref 1.7–2.4)

## 2018-06-01 MED ORDER — HEPARIN SODIUM (PORCINE) 1000 UNIT/ML DIALYSIS: 1000.0000 [IU] | INTRAMUSCULAR | Status: DC | PRN

## 2018-06-01 MED ORDER — LIDOCAINE-PRILOCAINE 2.5-2.5 % EX CREA
1.0000 "application " | TOPICAL_CREAM | CUTANEOUS | Status: DC | PRN
  Filled 2018-06-01: qty 5

## 2018-06-01 MED ORDER — "THROMBI-PAD 3""X3"" EX PADS"
1.0000 | MEDICATED_PAD | Freq: Once | CUTANEOUS | Status: AC
  Administered 2018-06-01: 1 via TOPICAL
  Filled 2018-06-01 (×2): qty 1

## 2018-06-01 MED ORDER — "THROMBI-PAD 3""X3"" EX PADS"
1.0000 | MEDICATED_PAD | Freq: Once | CUTANEOUS | Status: AC
  Administered 2018-06-01: 1 via TOPICAL
  Filled 2018-06-01: qty 1

## 2018-06-01 MED ORDER — ENSURE ENLIVE PO LIQD
237.0000 mL | Freq: Three times a day (TID) | ORAL | Status: DC
  Administered 2018-06-03: 237 mL via ORAL

## 2018-06-01 MED ORDER — ALTEPLASE 2 MG IJ SOLR: 2.0000 mg | Freq: Once | INTRAMUSCULAR | Status: DC | PRN

## 2018-06-01 MED ORDER — LIDOCAINE-EPINEPHRINE 1 %-1:100000 IJ SOLN
20.0000 mL | Freq: Once | INTRAMUSCULAR | Status: AC
  Administered 2018-06-01: 20 mL via INTRADERMAL
  Filled 2018-06-01 (×2): qty 20

## 2018-06-01 MED ORDER — QUETIAPINE FUMARATE 50 MG PO TABS
25.0000 mg | ORAL_TABLET | Freq: Two times a day (BID) | ORAL | Status: DC
  Administered 2018-06-01 – 2018-06-04 (×8): 25 mg via ORAL
  Filled 2018-06-01 (×10): qty 1

## 2018-06-01 MED ORDER — SODIUM CHLORIDE 0.9 % IV SOLN: 100.0000 mL | INTRAVENOUS | Status: DC | PRN

## 2018-06-01 MED ORDER — LIDOCAINE HCL (PF) 1 % IJ SOLN
5.0000 mL | INTRAMUSCULAR | Status: DC | PRN
  Filled 2018-06-01: qty 5

## 2018-06-01 MED ORDER — SODIUM CHLORIDE 0.9 % IV SOLN
20.0000 ug | Freq: Once | INTRAVENOUS | Status: AC
  Administered 2018-06-01: 20 ug via INTRAVENOUS
  Filled 2018-06-01 (×2): qty 5

## 2018-06-01 MED ORDER — PENTAFLUOROPROP-TETRAFLUOROETH EX AERO
1.0000 "application " | INHALATION_SPRAY | CUTANEOUS | Status: DC | PRN
  Filled 2018-06-01: qty 103.5

## 2018-06-01 MED ORDER — CHLORHEXIDINE GLUCONATE CLOTH 2 % EX PADS
6.0000 | MEDICATED_PAD | Freq: Every day | CUTANEOUS | Status: DC
  Administered 2018-06-02 – 2018-06-08 (×5): 6 via TOPICAL

## 2018-06-01 MED ORDER — LIDOCAINE-EPINEPHRINE 1 %-1:100000 IJ SOLN
10.0000 mL | Freq: Once | INTRAMUSCULAR | Status: DC
  Filled 2018-06-01: qty 10

## 2018-06-01 NOTE — Progress Notes (Signed)
  Speech Language Pathology Treatment: Dysphagia  Patient Details Name: Howard Thompson MRN: [id number] DOB: [birth year] Today's Date: 06/01/2018 Time: 0092-3300 SLP Time Calculation (min) (ACUTE ONLY): 15 min  Assessment / Plan / Recommendation Clinical Impression  Pt continues to be highly distractible. Needs max verbal cues to accept and attend to liquids and solids. Provided trials of jello which require more oral preparation than puree. Pt briefly masticated, held bolus and continued with max cueing and liquid wash. Family asked for advice of appropriate home foods to try, such as rice pudding, which would be fine with max assist. No diet upgrades at this time until attention to PO is better sustained. Will follow for further trials.   HPI HPI: Howard Thompson is a 51 y.o. male admitted on 7/27 s/p unwitnessed out of hospital cardiac arrest, VF, unknown downtime, past medical history of coronary artery disease, myasthenia gravis, hypertension.  Per wife, pt has had hx of dysphagia related to MG which is largely resolved.  Pt was intubated from 7/27 - 8/2.      SLP Plan  Continue with current plan of care       Recommendations  Diet recommendations: Dysphagia 1 (puree);Thin liquid Liquids provided via: Straw;Cup Medication Administration: Crushed with puree Supervision: Full supervision/cueing for compensatory strategies Compensations: Minimize environmental distractions;Slow rate;Small sips/bites Postural Changes and/or Swallow Maneuvers: Seated upright 90 degrees                Oral Care Recommendations: Oral care BID Follow up Recommendations: Skilled Nursing facility Plan: Continue with current plan of care       GO                Wajiha Versteeg, Katherene Ponto 06/01/2018, 2:33 PM

## 2018-06-01 NOTE — Progress Notes (Signed)
Admit: 05/22/2018 LOS: 10  88M ATN 2/2 CIN, cardiogenic shock; admit with cardiac arrest / STEMI to LAD req PCI; Off CRRT 05/30/18  Subjective:  . TDC placed by IR yesterday, some oozing overnight . Ongoing waxing/waning confusion . Bradycardic at times . Sig worsening in renal panel, BUN 119, K 5.5  . Remains Anuric  08/05 0701 - 08/06 0700 In: 620 [P.O.:200; I.V.:351] Out: -   Filed Weights   05/30/18 0700 05/31/18 0500 06/01/18 0600  Weight: 67.3 kg (148 lb 5.9 oz) 68.6 kg (151 lb 3.8 oz) 72.1 kg (158 lb 15.2 oz)    Scheduled Meds: . sodium chloride   Intravenous Once  . aspirin  81 mg Oral Daily  . B-complex with vitamin C  1 tablet Per Tube Daily  . Chlorhexidine Gluconate Cloth  6 each Topical Daily  . Chlorhexidine Gluconate Cloth  6 each Topical Q0600  . famotidine  20 mg Oral Daily  . folic acid  1 mg Oral Daily  . heparin  5,000 Units Subcutaneous Q8H  . hydrocortisone sod succinate (SOLU-CORTEF) inj  50 mg Intravenous Daily  . insulin aspart  0-9 Units Subcutaneous Q4H  . mouth rinse  15 mL Mouth Rinse BID  . metoprolol tartrate  25 mg Oral BID  . multivitamin with minerals  1 tablet Oral Daily  . QUEtiapine  25 mg Oral BID  . sodium chloride flush  10-40 mL Intracatheter Q12H  . sodium chloride flush  3 mL Intravenous Q12H  . thiamine  100 mg Oral Daily  . ticagrelor  90 mg Per Tube BID  . zinc sulfate  220 mg Oral Daily   Continuous Infusions: . sodium chloride 10 mL/hr at 05/27/18 1638  . sodium chloride Stopped (05/31/18 1413)  . sodium chloride 500 mL (05/27/18 1647)  . dexmedetomidine (PRECEDEX) IV infusion Stopped (06/01/18 0414)  . dextrose Stopped (05/25/18 0931)  . feeding supplement (VITAL AF 1.2 CAL) Stopped (05/28/18 1130)   PRN Meds:.sodium chloride, sodium chloride, docusate, hydrALAZINE, LORazepam, nitroGLYCERIN, sodium chloride flush, sodium chloride flush  Current Labs: reviewed   Physical Exam:  Blood pressure (!) 116/103, pulse 87,  temperature (!) 97.3 F (36.3 C), temperature source Axillary, resp. rate (!) 28, height 5\' 10"  (1.778 m), weight 72.1 kg (158 lb 15.2 oz), SpO2 100 %. NAD Confused RRR CTAB No sig LEE  A 1. Anuric AKI 2/2 ATN from CIN and Cardiogenic Shock, Off CRRT 05/30/18 2. STEMI at LAD with PCI 3. Cardiogenic Shock 2/2 #2, resolved of pressors / inotropes 4. Anemia, not on ESA, per primary 5. TCP, improving; HIT neg 6. VDRF, extubated on RA 7. AMS     P . HD today in ICU: 2K, 300/600 Qb/Qd, 3.5h, 2-3L UF, no heparin . Daily weights, Daily Renal Panel, Strict I/Os, Avoid nephrotoxins (NSAIDs, judicious IV Contrast)    Pearson Grippe MD 06/01/2018, 8:53 AM  Recent Labs  Lab 05/30/18 0357 05/31/18 0409 05/31/18 1847 06/01/18 0251  NA 138 141 139 140  K 4.7 4.8 5.3* 5.5*  CL 101 105 103 103  CO2 26 24 22  20*  GLUCOSE 87 104* 104* 108*  BUN 38* 77* 105* 119*  CREATININE 2.74* 5.60* 7.20* 8.07*  CALCIUM 8.2* 8.1* 8.2* 8.3*  PHOS 2.5 4.3  --  6.8*   Recent Labs  Lab 05/30/18 0357 05/31/18 0409 06/01/18 0251  WBC 10.8* 9.8 14.1*  NEUTROABS 8.9* 8.2* 12.3*  HGB 9.5* 8.9* 9.1*  HCT 29.9* 28.3* 28.8*  MCV 86.2 87.1  86.0  PLT 90* 132* 152

## 2018-06-01 NOTE — Progress Notes (Signed)
Patient monitor alarming bradycardia below 38 bpm, upon entering room HR 62 bpm, pt taking shallow breaths, sp02 level 88%. Pt previously on precedex drip for anxiety/agitation, drip turned off at 0412 d/t episodes of bradycardia. E-link was called, and Dr Gladys Damme called on camera in room to see patient. Zoll pads placed on patient, machine at bedside. Patient placed on non-rebreather oxygen mask. Updated Dr. Gladys Damme on bradycardia, sp02 drop, precedex drip on hold d/t bradycardia, and previous dose of ativan for agitation. Dr Gladys Damme ordered ABG and safety mitts so patient could not remove oxygen mask. Denied need to reverse ativan dose d/t it being given several hours ago, precedex drip to remain off at this time.

## 2018-06-01 NOTE — Procedures (Signed)
MD ordered to end HD tx and apply pressure to PC.

## 2018-06-01 NOTE — Procedures (Signed)
Pt continues to leak blood from insertion site of Permcath MD contacted dressing changed again

## 2018-06-01 NOTE — Evaluation (Addendum)
Occupational Therapy Evaluation Patient Details Name: Howard Thompson MRN: [id number] DOB: 02/02/1966 Today's Date: 06/01/2018    History of Present Illness 52 y.o. male out of hospital cardiac arrest (05/22/18). Prior history significant of MI several years ago, myasthenia gravis post thymus resection, HTN, anterior ST elevation post LAD stent. Intubated 7/27- 8/2.  s/p IJ tunneled HD catheter placed 05/31/18.   Clinical Impression   Per family reports, pt was living alone and was independent and working full time. Pt's sisters reporting they are unsure of pt's support at dc and will depend on his progress because "he has distanced himself from a lot of friends and family." Pt currently requiring total A for ADLs, bed mobility, and functional transfer. Pt presenting with decreased cognition, orientation, balance, strength, and activity tolerance. Pt will require further acute OT to increase occupational performance and participation. Recommend dc to SNF for further OT to increase safety and independence with ADLs and functional mobility.  HR 80s-90s SpO2 100% on 5L RR 17-22    Follow Up Recommendations  SNF    Equipment Recommendations  Other (comment)(Defer to next venue)    Recommendations for Other Services PT consult;Speech consult     Precautions / Restrictions        Mobility Bed Mobility Overal bed mobility: Needs Assistance Bed Mobility: Supine to Sit;Sit to Supine     Supine to sit: Total assist;+2 for physical assistance Sit to supine: Total assist;+2 for physical assistance   General bed mobility comments: Total A to sit EOB and then return to supine. Pt limited by decreased cognition, strength, and balance.  Transfers Overall transfer level: Needs assistance   Transfers: Sit to/from Stand Sit to Stand: Total assist         General transfer comment: Total A for sit<>Stand and to maintain standing while RN changed sheets    Balance Overall balance assessment:  Needs assistance Sitting-balance support: Feet supported;Bilateral upper extremity supported Sitting balance-Leahy Scale: Zero Sitting balance - Comments: Reliant on physical A Postural control: Right lateral lean Standing balance support: No upper extremity supported;During functional activity Standing balance-Leahy Scale: Zero Standing balance comment: Reliant on physical A                           ADL either performed or assessed with clinical judgement   ADL Overall ADL's : Needs assistance/impaired                                       General ADL Comments: Due to poor cognition, strength, and balance, pt requiring Total A for ADLs and bed mobility.     Vision Baseline Vision/History: No visual deficits Patient Visual Report: Other (comment)(per pt)       Perception     Praxis      Pertinent Vitals/Pain Pain Assessment: Faces Faces Pain Scale: Hurts even more Pain Location: IJ cath site Pain Descriptors / Indicators: Discomfort;Grimacing Pain Intervention(s): Monitored during session;Limited activity within patient's tolerance;Repositioned;Premedicated before session     Hand Dominance     Extremity/Trunk Assessment Upper Extremity Assessment Upper Extremity Assessment: Generalized weakness   Lower Extremity Assessment Lower Extremity Assessment: Defer to PT evaluation   Cervical / Trunk Assessment Cervical / Trunk Assessment: Other exceptions Cervical / Trunk Exceptions: Poor trunk control and strength. Tendency for forward flexion of shoulder and neck.   Communication Communication Communication: Other (  comment)(Confusion)   Cognition Arousal/Alertness: Awake/alert Behavior During Therapy: Agitated;Impulsive Overall Cognitive Status: Impaired/Different from baseline Area of Impairment: Orientation;Attention;Memory;Following commands;Safety/judgement;Awareness;Problem solving                 Orientation Level:  Disoriented to;Situation(Pt able to state he is in [place]. But unable to state name and DOB) Current Attention Level: Focused Memory: Decreased recall of precautions;Decreased short-term memory Following Commands: Follows one step commands inconsistently Safety/Judgement: Decreased awareness of safety;Decreased awareness of deficits Awareness: Intellectual Problem Solving: Slow processing;Decreased initiation;Difficulty sequencing;Requires verbal cues;Requires tactile cues General Comments: Pt following simple, one step commands 20% of time. Pt requiring increased time and cues throughout. Presenting with echolalia and copying therapist or RN verbally.    General Comments  Sisters present at begining of session. Sisters reports he was drinking a bottle of vodka a day PTA. SpO2 100% on 5L. HR 80s-90s. RR 20s.     Exercises     Shoulder Instructions      Home Living Family/patient expects to be discharged to:: Private residence Living Arrangements: Alone(he is seperated from his wife; she has been at the hospital) Available Help at Discharge: Family;Available PRN/intermittently(Unsure. Wife willing to help, but depends on "pt's attitude") Type of Home: House Home Access: Stairs to enter     Home Layout: Multi-level                   Additional Comments: Home information provided by two sisters; limited information since they had not been to his house. RN reports thatpt and his wife and filing divorse and he has a girlfriend who is pregnant.       Prior Functioning/Environment Level of Independence: Independent        Comments: ADLs, IADLs, driving, "successful" businessman.        OT Problem List: Decreased strength;Decreased range of motion;Impaired balance (sitting and/or standing);Decreased activity tolerance;Decreased coordination;Decreased cognition;Decreased safety awareness;Decreased knowledge of use of DME or AE;Decreased knowledge of  precautions;Cardiopulmonary status limiting activity;Pain      OT Treatment/Interventions: Self-care/ADL training;Therapeutic exercise;Energy conservation;DME and/or AE instruction;Therapeutic activities;Patient/family education    OT Goals(Current goals can be found in the care plan section) Acute Rehab OT Goals Patient Stated Goal: Unstated OT Goal Formulation: Patient unable to participate in goal setting Time For Goal Achievement: 06/15/18 Potential to Achieve Goals: Good ADL Goals Pt Will Perform Grooming: with min assist;sitting Pt Will Perform Upper Body Dressing: with min assist;sitting Pt Will Transfer to Toilet: with min assist;stand pivot transfer;bedside commode Additional ADL Goal #1: Pt will perform bed mobility with Min A in preparation for ADLs  OT Frequency: Min 2X/week   Barriers to D/C: Decreased caregiver support  Unsure of family support at dc       Co-evaluation              AM-PAC PT "6 Clicks" Daily Activity     Outcome Measure Help from another person eating meals?: Total Help from another person taking care of personal grooming?: Total Help from another person toileting, which includes using toliet, bedpan, or urinal?: Total Help from another person bathing (including washing, rinsing, drying)?: Total Help from another person to put on and taking off regular upper body clothing?: Total Help from another person to put on and taking off regular lower body clothing?: Total 6 Click Score: 6   End of Session Equipment Utilized During Treatment: Gait belt;Oxygen Nurse Communication: Need for lift equipment;Mobility status  Activity Tolerance: Patient limited by fatigue;Patient limited  by lethargy;Patient limited by pain Patient left: in bed;with call bell/phone within reach;with bed alarm set;with restraints reapplied  OT Visit Diagnosis: Unsteadiness on feet (R26.81);Other abnormalities of gait and mobility (R26.89);Muscle weakness (generalized)  (M62.81);Pain;Other symptoms and signs involving cognitive function Pain - Right/Left: Right Pain - part of body: (Chest)                Time: 6606-0045 OT Time Calculation (min): 42 min Charges:  OT General Charges $OT Visit: 1 Visit OT Evaluation $OT Eval High Complexity: 1 High OT Treatments $Self Care/Home Management : 23-37 mins  Whitney Bingaman MSOT, OTR/L Acute Rehab Pager: [phone] Office: [place] 06/01/2018, 9:30 AM

## 2018-06-01 NOTE — Procedures (Signed)
Upon assessment of pt insertion site of RPC was leaking moderate amounts of blood dressing change done with surgseal and bio patch MD aware

## 2018-06-01 NOTE — Progress Notes (Signed)
Called to see patient for bleeding from tunneled HD catheter. On my exam when ABD pad was removed, patient has an EXTREMELY large amount of clotted blood surrounding the catheter, approx 12x10x2cm in size. This clot was gently dislodged and catheter site was examined. The catheter insertion site was small and did not need to be sutured tighter. Slow oozing noted from suture sites in chest. Patient has already received DDAVP. Injected area with 10cc of 1% Lidocaine with Epi then applied new thrombi pads and new dressing. RN to continue to hold pressure for another 5 minutes. Patient is receiving Brilinta and ASA for a recent DES placed to LAD. May need to stop or at least hold this medication for a day or so. Cardiology has been paged to ask advice on this matter, but they are currently in the cath lab with another patient. RN to followup with cardiology when they are done in cath lab and return page. Hgb dropped from 9.1 to 7.6 over course of today; continue to monitor closely. Hold Heparin dvt prophylaxis. Apply SCD's instead.  35 minutes critical care time  Vernie Murders, MD Pulmonary & Critical Care Medicine Pager: [phone]

## 2018-06-01 NOTE — Procedures (Signed)
Pt rinsed off machine early per MD d/t continuous bleeding from permcath pt rinsed off machine

## 2018-06-01 NOTE — Progress Notes (Signed)
PULMONARY / CRITICAL CARE MEDICINE   Name: Howard Thompson MRN: [id number] DOB: 1966-03-14    ADMISSION DATE:  05/22/2018 CONSULTATION DATE:  05/22/18       HISTORY OF PRESENT ILLNESS:    Howard Thompson, 52 y.o. male admitted on 05/22/2018 s/p unwitnessed out of hospital cardiac arrest, VF, unknown downtime, past medical history of coronary artery disease, myasthenia gravis, hypertension.  Per the electronic medical record the patient received 5 defibrillations and given 300 mg amiodarone.  Upon arrival the patient was found to have a STEMI and was taken to the Cath Lab where a DES was placed and a 100% LAD lesion.  Post catheter patient was taken for therapeutic hypothermia to the intensive care unit.  Per the initial critical care documentation there was concern for posturing.  EKGs initially with a prolonged QT at 560, now improved.  He was initially placed in a c-collar, presenting head CT and CT cervical spine negative for acute abnormalities.   05/31/18 The patient is awake. Respiratory status appears ok. No major complaints. SLP has approved him for a dysphagia 1 diet. He is moving all extremties. He will follow simple commands. He will be switching ot IHD from CRRT. He is having his catheter changed from femoral to subclavian today with the IR service.  8/6 The patient had a bit if a difficult time secondary to delirium. I have started him on bid Seroquel. We have stoipped the precedex overnight because of bradycardia. He had a tunnelled catheter for dialysis placed yesterday. The bandage is fairly bloody but it appears that the bleeeding has stoppeed. The patient is to start dialysis today.   STUDIES:  LHC 05/22/2018 by Dr. Harriet [place] LAD to Prox LAD lesion is 100% stenosed.  A drug-eluting stent was successfully placed using a STENT SYNERGY DES 3X20.  Post intervention, there is a 0% residual stenosis.  There is severe left ventricular systolic dysfunction.  LV end diastolic  pressure is mildly elevated.  The left ventricular ejection fraction is less than 25% by visual estimate.  ECHO 05/23/2018: EF 49-70%, Grade 2 Diastolic dysfunction  Regional wall motion abnormalities     PAST MEDICAL HISTORY :  He  has no past medical history on file.  PAST SURGICAL HISTORY: He  has a past surgical history that includes LEFT HEART CATH AND CORONARY ANGIOGRAPHY (N/A, 05/22/2018); CORONARY STENT INTERVENTION (N/A, 05/22/2018); IR US Guide Vasc Access Right (05/31/2018); and IR Fluoro Guide CV Line Right (05/31/2018).  No Known Allergies  No current facility-administered medications on file prior to encounter.    Current Outpatient Medications on File Prior to Encounter  Medication Sig  . nitroGLYCERIN (NITROSTAT) 0.4 MG SL tablet Place 0.4 mg under the tongue every 5 (five) minutes as needed for chest pain.    FAMILY HISTORY:  His family history is not on file.  SOCIAL HISTORY: He  reports that he has been smoking cigarettes.  He has been smoking about 1.00 pack per day. He uses smokeless tobacco.      VITAL SIGNS: BP (!) 116/103   Pulse 87   Temp (!) 97.3 F (36.3 C) (Axillary)   Resp (!) 28   Ht 5\' 10"  (1.778 m)   Wt 158 lb 15.2 oz (72.1 kg)   SpO2 100%   BMI 22.81 kg/m          INTAKE / OUTPUT: I/O last 3 completed shifts: In: 107 [P.O.:200; I.V.:555] Out: 0   PHYSICAL EXAMINATION:   Physical Exam  Constitutional: He appears well-developed.  HENT:  Head: Normocephalic and atraumatic.  Eyes: Pupils are equal, round, and reactive to light. EOM are normal.  Neck: Normal range of motion. Neck supple.  Cardiovascular: Normal rate and regular rhythm.  Pulmonary/Chest: Effort normal.  Abdominal: Soft. Bowel sounds are normal. He exhibits no distension. There is no tenderness.  Musculoskeletal: Normal range of motion.  Neurological: He is alert.  The patient is at times agitated and a bit confused. At other times he will answer simple questions  and repond approriately  Skin: Skin is warm and dry. Capillary refill takes less than 2 seconds.                                                     LABS:  BMET Recent Labs  Lab 05/31/18 0409 05/31/18 1847 06/01/18 0251  NA 141 139 140  K 4.8 5.3* 5.5*  CL 105 103 103  CO2 24 22 20*  BUN 77* 105* 119*  CREATININE 5.60* 7.20* 8.07*  GLUCOSE 104* 104* 108*    Electrolytes Recent Labs  Lab 05/30/18 0357 05/31/18 0409 05/31/18 1847 06/01/18 0251  CALCIUM 8.2* 8.1* 8.2* 8.3*  MG 2.7* 3.3*  --  3.6*  PHOS 2.5 4.3  --  6.8*    CBC Recent Labs  Lab 05/30/18 0357 05/31/18 0409 06/01/18 0251  WBC 10.8* 9.8 14.1*  HGB 9.5* 8.9* 9.1*  HCT 29.9* 28.3* 28.8*  PLT 90* 132* 152    Coag's Recent Labs  Lab 05/31/18 1003 06/01/18 0251  INR 1.10 1.14    Sepsis Markers Recent Labs  Lab 05/29/18 2332  LATICACIDVEN 1.3    ABG Recent Labs  Lab 05/27/18 0745 05/29/18 0334 06/01/18 0505  PHART 7.437 7.446 7.348*  PCO2ART 39.4 36.2 35.1  PO2ART 154.0* 85.0 102.0    Liver Enzymes Recent Labs  Lab 05/30/18 0357 05/31/18 0409 06/01/18 0251  AST 130* 71* 52*  ALT 577* 115* 26  ALKPHOS 199* 172* 150*  BILITOT 1.4* 1.6* 1.2  ALBUMIN 2.4* 2.4* 2.4*    Cardiac Enzymes Recent Labs  Lab 05/28/18 2027 05/29/18 0333 05/29/18 1445  TROPONINI 0.92* 0.87* 0.66*    Glucose Recent Labs  Lab 05/31/18 1146 05/31/18 1851 05/31/18 1941 05/31/18 2309 06/01/18 0303 06/01/18 0731  GLUCAP 89 96 94 98 101* 93         DISCUSSION:  The patient has made great strides. When I last saw him over 1 week ago he was in profound shock with massive presspr requirements. He may be able to leave the IVUnenvironment shortly.  ASSESSMENT / PLAN:  PULMONARY  CXR 8/3 appeared a little congested . Will recheck it after the line has been placed. His respiratory effort is gooid and 02 needs minimal ( nasal cannula).  CARDIOVASCULAR  s/p DES stent to the  LAD On 7/28 his LVEFwas 25-30% with significant wallmotionabnoramlites.it proabbly bears repeating t=in the next 1-2 weeks.  RENAL Has not shown signs of renal recovery yet. Will be transitionong to IHD tomorrow. He is slighlty hyperkalemic but he will be getting dialysis   Nutrition patient is weak. He is on dysphagia 1 diet. SLPto follow.         Micheal Likens MD Pulmonary and [place] Pager: [phone] Cell [phone]  06/01/2018, 8:52 AM

## 2018-06-01 NOTE — Progress Notes (Signed)
Spoke with Dr. Joelyn Oms regarding potassium and HD timing for Howard Thompson. Dialysis unit originally could not get to him until late this evening, but pt's mentation is declining and potassium is 5.5  Dr Joelyn Oms is aware and ok with current labs; will try to get Howard Thompson in to dialysis between 11-1pm.

## 2018-06-01 NOTE — Evaluation (Signed)
Physical Therapy Evaluation Patient Details Name: Howard Thompson MRN: [id number] DOB: 04-Dec-1965 Today's Date: 06/01/2018   History of Present Illness  52 y.o. male out of hospital cardiac arrest (05/22/18). Prior history significant of MI several years ago, myasthenia gravis post thymus resection, HTN, anterior ST elevation post LAD stent. Intubated 7/27- 8/2.  s/p IJ tunneled HD catheter placed 05/31/18.  Clinical Impression  Arrived to patient semi-reclined in bed with wife present. Pt confused but agreeable during therapy. Pt was on 5L O2 prior to session, was removed an maintained SpO2 97-100 during activity. Pt left on 0L with nurse aware. Pt deficits are decreased cognition, balance, functional mobility and transfers. Will benefit from acute therapy to address above mentioned deficits to improve safety, functional mobility and independence and decrease fall risks. Pt HR ranged 81-97, RR 22-25 during session. Estranged wife willing to care for pt at discharge.     Follow Up Recommendations CIR;Supervision/Assistance - 24 hour    Equipment Recommendations       Recommendations for Other Services       Precautions / Restrictions Precautions Precautions: Fall Restrictions Weight Bearing Restrictions: No      Mobility  Bed Mobility Overal bed mobility: Needs Assistance Bed Mobility: Supine to Sit;Sit to Supine     Supine to sit: Mod assist Sit to supine: Min assist   General bed mobility comments: pt limited by decreased trunk control with mod a to swing feet off and on bed, cues for initiation, sequence, and positioning   Transfers Overall transfer level: Needs assistance Equipment used: Rolling walker (2 wheeled) Transfers: Sit to/from Stand Sit to Stand: +2 physical assistance;Total assist         General transfer comment: bil knees blocked to maintain standing, cues for initiation, sequence, and positioning, assist to rise and stabilize with pt able to sidestep 1 foot at  side of bed with physical assist leg placement  Ambulation/Gait                Stairs            Wheelchair Mobility    Modified Rankin (Stroke Patients Only)       Balance Overall balance assessment: Needs assistance   Sitting balance-Leahy Scale: Poor Sitting balance - Comments: pt able to maintain balance unassisted for about 8 sec, with verbal cues and min assist to correct posture and trunk lean (anterior/posterior/both sides). Sat EOB 4 minutes total Postural control: Other (comment)(swayed to varying positions) Standing balance support: Bilateral upper extremity supported Standing balance-Leahy Scale: Zero                               Pertinent Vitals/Pain Pain Location: pt grimacing said both knees hurting, also said sore all over  Pain Descriptors / Indicators: Grimacing;Sore;Discomfort Pain Intervention(s): Monitored during session    Home Living Family/patient expects to be discharged to:: Private residence Living Arrangements: Alone;Other (Comment)(separated from wife, if willing wife has agreed to have him stay with her d/c) Available Help at Discharge: Family Type of Home: House Home Access: Stairs to enter   Technical brewer of Steps: 3  Home Layout: One level Home Equipment: None Additional Comments: home information provided by wife    Prior Function Level of Independence: Independent         Comments: ADLs, owns a heavy equipment business      Hand Dominance        Extremity/Trunk Assessment  Upper Extremity Assessment Upper Extremity Assessment: Generalized weakness;Difficult to assess due to impaired cognition    Lower Extremity Assessment Lower Extremity Assessment: Generalized weakness;Difficult to assess due to impaired cognition    Cervical / Trunk Assessment Cervical / Trunk Assessment: Kyphotic Cervical / Trunk Exceptions: Poor trunk control and strength. Tendency for forward flexion of  shoulder and neck.  Communication   Communication: Other (comment)(confusion, slurred/garbled speech at times)  Cognition Arousal/Alertness: Awake/alert Behavior During Therapy: Restless Overall Cognitive Status: Impaired/Different from baseline Area of Impairment: Orientation;Awareness;Problem solving;Memory;Attention;Following commands;Safety/judgement                 Orientation Level: Disoriented to;Person;Time;Situation Current Attention Level: Focused Memory: Decreased recall of precautions;Decreased short-term memory Following Commands: Follows one step commands inconsistently Safety/Judgement: Decreased awareness of safety;Decreased awareness of deficits Awareness: Intellectual Problem Solving: Slow processing;Decreased initiation;Difficulty sequencing;Requires verbal cues;Requires tactile cues General Comments: Pt follows simple one step commands 30% of time. Pt required increased processing times throughout with cues to refocus.       General Comments General comments (skin integrity, edema, etc.): Wife present during session. SpO2 95-100 on 0L HR 81-87 RR 22-25     Exercises     Assessment/Plan    PT Assessment Patient needs continued PT services  PT Problem List Decreased strength;Decreased safety awareness;Decreased coordination;Decreased knowledge of precautions;Decreased cognition;Decreased balance       PT Treatment Interventions DME instruction;Therapeutic activities;Cognitive remediation;Gait training;Therapeutic exercise;Patient/family education;Stair training;Balance training;Functional mobility training;Neuromuscular re-education    PT Goals (Current goals can be found in the Care Plan section)  Acute Rehab PT Goals 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  PT Goal Formulation: With family Time For Goal Achievement: 06/15/18 Potential to Achieve Goals: Fair    Frequency Min 3X/week   Barriers to discharge        Co-evaluation                AM-PAC PT "6 Clicks" Daily Activity  Outcome Measure Difficulty turning over in bed (including adjusting bedclothes, sheets and blankets)?: Unable Difficulty moving from lying on back to sitting on the side of the bed? : Unable Difficulty sitting down on and standing up from a chair with arms (e.g., wheelchair, bedside commode, etc,.)?: Unable Help needed moving to and from a bed to chair (including a wheelchair)?: Total Help needed walking in hospital room?: Total Help needed climbing 3-5 steps with a railing? : Total 6 Click Score: 6    End of Session Equipment Utilized During Treatment: Gait belt Activity Tolerance: Patient tolerated treatment well Patient left: in bed;with bed alarm set;with call bell/phone within reach;with family/visitor present Nurse Communication: Mobility status PT Visit Diagnosis: Unsteadiness on feet (R26.81);Muscle weakness (generalized) (M62.81);Other abnormalities of gait and mobility (R26.89);Other symptoms and signs involving the nervous system (R29.898)    Time: 7628-3151 PT Time Calculation (min) (ACUTE ONLY): 20 min   Charges:   PT Evaluation $PT Eval Moderate Complexity: [place], [place]  Acute Rehab [phone]   Samuella Bruin 06/01/2018, 12:16 PM

## 2018-06-01 NOTE — Progress Notes (Signed)
Referring Physician(s): Dr. Joelyn Oms  Supervising Physician: Marybelle Killings  Patient Status:  [facility] - In-pt  Chief Complaint: Bleeding from HD catheter insertion site.  Subjective:  52 y/o male PMH CAD, HTN, myasthenia gravis s/p unwitnessed cardiac arrest on 7/27 requiring 5 defibrillations and 300 mg amiodarone before ROSC. He was taken to cath lab and DES was placed. He required post catheter therapeutic hypothermia in ICU. He was started on CRRT via right femoral HD catheter with right tunneled IJ catheter placed yesterday by Dr. Vernard Gambles in IR.  Concerns for continued bleeding from insertion site today - per RN dressing becomes saturated within a few minutes after changing. She is unsure if pressure as been held overnight but she has not held pressure this morning. She states patient remains on Brillinta for anticoagulation.  Patient alert, cannot answer questions but follows commands and makes nonsensical statements during exam. He has safety mitts on due to increased agitation last night and pulling at non-rebreather/catheter.  Allergies: Patient has no known allergies.  Medications: Prior to Admission medications   Medication Sig Start Date End Date Taking? Authorizing Provider  nitroGLYCERIN (NITROSTAT) 0.4 MG SL tablet Place 0.4 mg under the tongue every 5 (five) minutes as needed for chest pain.   Yes [provider]     Vital Signs: BP (!) 116/103   Pulse 87   Temp (!) 97.3 F (36.3 C) (Axillary)   Resp (!) 28   Ht 5\' 10"  (1.778 m)   Wt 158 lb 15.2 oz (72.1 kg)   SpO2 100%   BMI 22.81 kg/m   Physical Exam  Constitutional: No distress.  HENT:  Head: Normocephalic.  Pulmonary/Chest: Effort normal.  Non-rebreather in place  Abdominal: Soft.  Neurological: He is alert.  Somewhat agitated on exam, follows simple commands appropriately. Safety mitts on bilateral hands.   Skin: Skin is warm and dry. No rash noted. He is not diaphoretic. No pallor.  Right  tunneled IJ in place, sutures in tact. Large clot on entirety of exposed catheter. Dribbling of blood from underside of catheter noted. Pressure held at insertion site and IJ which slowed bleeding, new dressing applied.   Nursing note and vitals reviewed.   Imaging: Ir Fluoro Guide Cv Line Right  Result Date: 05/31/2018 CLINICAL DATA:  Acute kidney injury, needs durable venous access for hemodialysis EXAM: TUNNELED HEMODIALYSIS CATHETER PLACEMENT WITH ULTRASOUND AND FLUOROSCOPIC GUIDANCE TECHNIQUE: The procedure, risks, benefits, and alternatives were explained to the patient and family. Questions regarding the procedure were encouraged and answered. The family understands and consents to the procedure. As antibiotic prophylaxis, cefazolin 2 g was ordered pre-procedure and administered intravenously within one hour of incision.Patency of the right IJ vein was confirmed with ultrasound with image documentation. An appropriate skin site was determined. Region was prepped using maximum barrier technique including cap and mask, sterile gown, sterile gloves, large sterile sheet, and Chlorhexidine as cutaneous antisepsis. The region was infiltrated locally with 1% lidocaine. Intravenous Fentanyl and Versed were administered as conscious sedation during continuous monitoring of the patient's level of consciousness and physiological / cardiorespiratory status by the radiology RN, with a total moderate sedation time of 33 minutes. Under real-time ultrasound guidance, the right IJ vein was accessed with a 21 gauge micropuncture needle; the needle tip within the vein was confirmed with ultrasound image documentation. Needle exchanged over the 018 guidewire for transitional dilator, which allowed advancement of a Benson wire into the IVC. Over this, an MPA catheter was advanced. A  Palindrome 23 hemodialysis catheter was tunneled from the right anterior chest wall approach to the right IJ dermatotomy site. The MPA catheter  was exchanged over an Amplatz wire for serial vascular dilators which allow placement of a peel-away sheath, through which the catheter was advanced under intermittent fluoroscopy, positioned with its tips in the proximal and midright atrium. Spot chest radiograph confirms good catheter position. No pneumothorax. Catheter was flushed and primed per protocol. Catheter secured externally with O Prolene sutures. The right IJ dermatotomy site was closed with Dermabond. COMPLICATIONS: COMPLICATIONS None immediate FLUOROSCOPY TIME:  24 seconds; 2 mGy COMPARISON:  None IMPRESSION: 1. Technically successful placement of tunneled right IJ hemodialysis catheter with ultrasound and fluoroscopic guidance. Ready for routine use. ACCESS: Remains approachable for percutaneous intervention as needed. Electronically Signed   By: Lucrezia Europe M.D.   On: 05/31/2018 16:47   Ir US Guide Vasc Access Right  Result Date: 05/31/2018 CLINICAL DATA:  Acute kidney injury, needs durable venous access for hemodialysis EXAM: TUNNELED HEMODIALYSIS CATHETER PLACEMENT WITH ULTRASOUND AND FLUOROSCOPIC GUIDANCE TECHNIQUE: The procedure, risks, benefits, and alternatives were explained to the patient and family. Questions regarding the procedure were encouraged and answered. The family understands and consents to the procedure. As antibiotic prophylaxis, cefazolin 2 g was ordered pre-procedure and administered intravenously within one hour of incision.Patency of the right IJ vein was confirmed with ultrasound with image documentation. An appropriate skin site was determined. Region was prepped using maximum barrier technique including cap and mask, sterile gown, sterile gloves, large sterile sheet, and Chlorhexidine as cutaneous antisepsis. The region was infiltrated locally with 1% lidocaine. Intravenous Fentanyl and Versed were administered as conscious sedation during continuous monitoring of the patient's level of consciousness and physiological /  cardiorespiratory status by the radiology RN, with a total moderate sedation time of 33 minutes. Under real-time ultrasound guidance, the right IJ vein was accessed with a 21 gauge micropuncture needle; the needle tip within the vein was confirmed with ultrasound image documentation. Needle exchanged over the 018 guidewire for transitional dilator, which allowed advancement of a Benson wire into the IVC. Over this, an MPA catheter was advanced. A Palindrome 23 hemodialysis catheter was tunneled from the right anterior chest wall approach to the right IJ dermatotomy site. The MPA catheter was exchanged over an Amplatz wire for serial vascular dilators which allow placement of a peel-away sheath, through which the catheter was advanced under intermittent fluoroscopy, positioned with its tips in the proximal and midright atrium. Spot chest radiograph confirms good catheter position. No pneumothorax. Catheter was flushed and primed per protocol. Catheter secured externally with O Prolene sutures. The right IJ dermatotomy site was closed with Dermabond. COMPLICATIONS: COMPLICATIONS None immediate FLUOROSCOPY TIME:  24 seconds; 2 mGy COMPARISON:  None IMPRESSION: 1. Technically successful placement of tunneled right IJ hemodialysis catheter with ultrasound and fluoroscopic guidance. Ready for routine use. ACCESS: Remains approachable for percutaneous intervention as needed. Electronically Signed   By: Lucrezia Europe M.D.   On: 05/31/2018 16:47   Dg Chest Port 1 View  Result Date: 05/29/2018 CLINICAL DATA:  Acute respiratory failure. EXAM: PORTABLE CHEST 1 VIEW COMPARISON:  05/26/2018 FINDINGS: Endotracheal tube and NG tube is been removed in the interval. The left IJ central line remains in place, crossing the midline and extending cranially towards the thoracic inlet, likely in the right innominate vein directed cranially. Cardiopericardial silhouette is at upper limits of normal for size. Interval progression of  bilateral airspace disease with central predominance. The visualized  bony structures of the thorax are intact. Telemetry leads overlie the chest. IMPRESSION: 1. Interval progression of central airspace disease suggests edema. 2. Left IJ central line crosses the midline is directed cranially on the right, overlying the expected location of the right innominate vein. Electronically Signed   By: Misty Stanley M.D.   On: 05/29/2018 02:59   Dg Abd Portable 1v  Result Date: 05/29/2018 CLINICAL DATA:  Pain. EXAM: PORTABLE ABDOMEN - 1 VIEW COMPARISON:  None. FINDINGS: Right femoral catheter terminates at the level of the sacrum. No bowel dilatation to suggest obstruction. Slight increased air throughout central small bowel without abnormal distension. No evidence of free air. No radiopaque calculi. Ill-defined patchy bibasilar opacities. Incidental hemi transitional lumbosacral anatomy. IMPRESSION: Nonobstructive bowel gas pattern. Slight increased small bowel gas suggests mild ileus. Electronically Signed   By: Jeb Levering M.D.   On: 05/29/2018 02:37   Dg Swallowing Func-speech Pathology  Result Date: 05/30/2018 Objective Swallowing Evaluation: Type of Study: Bedside Swallow Evaluation  Patient Details Name: Howard Thompson MRN: [id number] Date of Birth: 05-14-1966 Today's Date: 05/30/2018 Time: SLP Start Time (ACUTE ONLY): 1115 -SLP Stop Time (ACUTE ONLY): 1138 SLP Time Calculation (min) (ACUTE ONLY): 23 min Past Medical History: No past medical history on file. Past Surgical History: Past Surgical History: Procedure Laterality Date . CORONARY STENT INTERVENTION N/A 05/22/2018  Procedure: CORONARY STENT INTERVENTION;  Surgeon: Lorretta Harp, MD;  Location: [facility];  Service: Cardiovascular;  Laterality: N/A; . LEFT HEART CATH AND CORONARY ANGIOGRAPHY N/A 05/22/2018  Procedure: LEFT HEART CATH AND CORONARY ANGIOGRAPHY;  Surgeon: Lorretta Harp, MD;  Location: [facility];  Service:  Cardiovascular;  Laterality: N/A; HPI: Aaban Griep is a 52 y.o. male admitted on 7/27 s/p unwitnessed out of hospital cardiac arrest, VF, unknown downtime, past medical history of coronary artery disease, myasthenia gravis, hypertension.  Per wife, pt has had hx of dysphagia related to MG which is largely resolved.  Pt was intubated from 7/27 - 8/2.  Subjective: The patient was seen in radiology for instrumental exam with nurse present.  Assessment / Plan / Recommendation CHL IP CLINICAL IMPRESSIONS 05/30/2018 Clinical Impression MBS was completed using thin liquids via spoon and straw, nectar thick liquids via cup sip, pureed material and solids.  The patient presented with a mild pharyngeal dysphagia.  His biggest risk factor for aspiration is his current mentation and ability to attend to task.  At times, he held material orally requiring cue to swallow.  This was related to his current level of confusion rather then a true oral dysphagia.  The pharyngeal phase was marked by mildly reduced base of tongue retraction which led to mild vallecula residue across textures.  There was one episode of penetration to the level of the vocal cords given a straw sip of thin liquids following presentation of regular solids. The patient required cue to cough and reswallow that was minimally successful most likely due to lack of effort on the his part due to difficulty fully following commands.   Esophageal sweep revealed it slow to clear with liquid wash being helpful to facilitate clearance.  Recommend continue with a pureed diet with thin liquids.  The patient MUST be sat totally upright for all intake and should eat/drink slowly.  As mentation clears he can most likely be advanced to regular solids.  As stated above his biggest risk factor for aspiration is his current mentation and decreased attention to task  ST will follow  for therapeutic diet tolerance and possible diet advancement as mentation hopefully clears.  MD  please consider cognitive/liguistic evaluation. SLP Visit Diagnosis Dysphagia, oropharyngeal phase (R13.12) Attention and concentration deficit following -- Frontal lobe and executive function deficit following -- Impact on safety and function Mild aspiration risk   CHL IP TREATMENT RECOMMENDATION 05/30/2018 Treatment Recommendations Therapy as outlined in treatment plan below   Prognosis 05/30/2018 Prognosis for Safe Diet Advancement Good Barriers to Reach Goals Cognitive deficits Barriers/Prognosis Comment -- CHL IP DIET RECOMMENDATION 05/30/2018 SLP Diet Recommendations Dysphagia 1 (Puree) solids;Thin liquid Liquid Administration via Cup;Straw Medication Administration Whole meds with puree Compensations Minimize environmental distractions;Slow rate;Small sips/bites Postural Changes Seated upright at 90 degrees;Remain semi-upright after after feeds/meals (Comment)   CHL IP OTHER RECOMMENDATIONS 05/30/2018 Recommended Consults -- Oral Care Recommendations Oral care before and after PO Other Recommendations --   CHL IP FOLLOW UP RECOMMENDATIONS 05/30/2018 Follow up Recommendations Other (comment)   CHL IP FREQUENCY AND DURATION 05/30/2018 Speech Therapy Frequency (ACUTE ONLY) min 2x/week Treatment Duration 2 weeks      CHL IP ORAL PHASE 05/30/2018 Oral Phase WFL Oral - Pudding Teaspoon -- Oral - Pudding Cup -- Oral - Honey Teaspoon -- Oral - Honey Cup -- Oral - Nectar Teaspoon -- Oral - Nectar Cup -- Oral - Nectar Straw -- Oral - Thin Teaspoon -- Oral - Thin Cup -- Oral - Thin Straw -- Oral - Puree -- Oral - Mech Soft -- Oral - Regular -- Oral - Multi-Consistency -- Oral - Pill -- Oral Phase - Comment --  CHL IP PHARYNGEAL PHASE 05/30/2018 Pharyngeal Phase Impaired Pharyngeal- Pudding Teaspoon -- Pharyngeal -- Pharyngeal- Pudding Cup -- Pharyngeal -- Pharyngeal- Honey Teaspoon -- Pharyngeal -- Pharyngeal- Honey Cup -- Pharyngeal -- Pharyngeal- Nectar Teaspoon -- Pharyngeal -- Pharyngeal- Nectar Cup Delayed swallow  initiation-vallecula;Reduced tongue base retraction;Pharyngeal residue - valleculae Pharyngeal -- Pharyngeal- Nectar Straw -- Pharyngeal -- Pharyngeal- Thin Teaspoon Delayed swallow initiation-pyriform sinuses;Reduced epiglottic inversion;Reduced tongue base retraction;Pharyngeal residue - valleculae Pharyngeal -- Pharyngeal- Thin Cup -- Pharyngeal -- Pharyngeal- Thin Straw Delayed swallow initiation-pyriform sinuses;Reduced epiglottic inversion;Penetration/Aspiration during swallow;Reduced tongue base retraction;Pharyngeal residue - valleculae Pharyngeal Material enters airway, CONTACTS cords and not ejected out Pharyngeal- Puree Reduced tongue base retraction;Pharyngeal residue - valleculae Pharyngeal -- Pharyngeal- Mechanical Soft -- Pharyngeal -- Pharyngeal- Regular Reduced tongue base retraction;Pharyngeal residue - valleculae Pharyngeal -- Pharyngeal- Multi-consistency -- Pharyngeal -- Pharyngeal- Pill -- Pharyngeal -- Pharyngeal Comment --  CHL IP CERVICAL ESOPHAGEAL PHASE 05/30/2018 Cervical Esophageal Phase WFL Pudding Teaspoon -- Pudding Cup -- Honey Teaspoon -- Honey Cup -- Nectar Teaspoon -- Nectar Cup -- Nectar Straw -- Thin Teaspoon -- Thin Cup -- Thin Straw -- Puree -- Mechanical Soft -- Regular -- Multi-consistency -- Pill -- Cervical Esophageal Comment -- Shelly Flatten, MA, CCC-SLP Acute Rehab SLP [phone] [name] 05/30/2018, 11:57 AM               Labs:  CBC: Recent Labs    05/29/18 0333 05/30/18 0357 05/31/18 0409 06/01/18 0251  WBC 9.3 10.8* 9.8 14.1*  HGB 8.9* 9.5* 8.9* 9.1*  HCT 27.9* 29.9* 28.3* 28.8*  PLT 73* 90* 132* 152    COAGS: Recent Labs    05/22/18 1744 05/22/18 2027 05/23/18 0439 05/24/18 1010 05/25/18 0409 05/31/18 1003 06/01/18 0251  INR 1.07 5.53* 1.55 3.87 2.25 1.10 1.14  APTT 30 122* 59*  --  43*  --   --     BMP: Recent Labs    05/30/18 0357  05/31/18 0409 05/31/18 1847 06/01/18 0251  NA 138 141 139 140  K 4.7 4.8 5.3* 5.5*  CL 101  105 103 103  CO2 26 24 22  20*  GLUCOSE 87 104* 104* 108*  BUN 38* 77* 105* 119*  CALCIUM 8.2* 8.1* 8.2* 8.3*  CREATININE 2.74* 5.60* 7.20* 8.07*  GFRNONAA 25* 11* 8* 7*  GFRAA 29* 12* 9* 8*    LIVER FUNCTION TESTS: Recent Labs    05/29/18 0333 05/30/18 0357 05/31/18 0409 06/01/18 0251  BILITOT 1.7* 1.4* 1.6* 1.2  AST 263* 130* 71* 52*  ALT 1,326* 577* 115* 26  ALKPHOS 226* 199* 172* 150*  PROT 5.4* 5.7* 5.6* 5.6*  ALBUMIN 2.4* 2.4* 2.4* 2.4*    Assessment and Plan:  Right tunneled IJ HD catheter placed 8/5 by Dr. Vernard Gambles with ongoing bleeding from insertion site, patient currently on ASA, Brillinta and was also on heparin until 0300 today.   Large clot present to entire exposed catheter, some leakage of blood from underside of catheter which appeared to improve with holding pressure at insertion site and IJ. RN given instructions to hold pressure until hemostasis is achieved, additionally sandbag may be applied to patient's chest to aid with hemostasis if he will tolerate this.   Please call IR with further questions or concerns.  Electronically Signed: Joaquim Nam, PA-C 06/01/2018, 8:54 AM   I spent a total of 15 Minutes at the the patient's bedside AND on the patient's hospital floor or unit, greater than 50% of which was counseling/coordinating care for right tunneled IJ HD catheter follow up.

## 2018-06-01 NOTE — Progress Notes (Signed)
Patient restless in early afternoon.  Family in to visit (including sisters and a brother).  Has halting speech and word-finding difficulties.  Requires constant cueing, especially during meal-times.  Patient is unable to perform simple,  two-stage commands.  Is dependant for all ADLs and is incontinent of stool.  Balance while sitting on EOB is difficult as he sways to varying sides and is dependant on others to maintain positioning.  Right HD cath site dressing has large amt of sanguinous drainage.  Pressure applied to site and dressing reinforced.  MD is aware of increased drainage at this site.  Will need to closely monitor.  Report given to Milon Score, RN' on 2C12.  Family alerted as to patient's transfer post-dialysis to this unit.

## 2018-06-01 NOTE — Progress Notes (Signed)
Paged IR for bleeding tunneled perm cath placed by them yesterday for Howard Thompson. Saturating abdominal pad dressings and actively bleeding. No new orders at this time, awaiting call back. Dressing changed and reinforced with pressure dressing.

## 2018-06-01 NOTE — Progress Notes (Signed)
Pt arrived from HD @approx  1930 with significant bleeding from HD Cath. Dressing reinforced.  @2010  Dressing saturated and reinforced additionally. Paged Dr. Aundra Dubin on-call for Dr. Gwenlyn Found. No response.  @2028  Paged IR on-call MD, Dr.Yamagata.  @2030  Page returned by IR. Thrombi Pad Ordered Along with Elevation of Head of Bed to pt's tolerance (>45 degrees).  @2128  Repaged Aundra Dubin due to no response. @2133  Repaged directly to pager in [place] secondary to no response.   @2156  RR RN paged, regarding pt's continued bleeding and immediately came to bedside. CCM contacted via E-Link and Night AP, Georgann Housekeeper, sent to bedside. He arrived @approx  2220 and ordered a STAT CBC and PT/INR. Site then assessed with Dr. Jimmey Ralph and intervention provided as outlined in her note dated 8/6.  @2350  Dr. Aundra Dubin re-paged regarding Brilinta and if it should be held per Dr. Jimmey Ralph note.  @0134  Dr. Aundra Dubin re-paged regarding above.  @0156  Paged Dr. Alva Garnet regarding Kary Kos. Returned page @0202  and told to hold Brilinta until AM when Cardiology can readdress.  @0420  during reassessment, HD Cath site noted to be bleeding again. Manual pressure held for 10 minutes and dressing reinforced.  @0626  paged RR RN to confirm her daytime counterpart would round on pt early. HD site bleeding/oozing again despite pressure being held to site repeatedly. Dressing reinforced again. Blood previously ordered by Dr. Alva Garnet currently infusing. Will continue to monitor.  Despite acute blood loss, pt VS remain stable. Pt mentation throughout night has waxed and waned with baseline confusion but has always remained oriented to self and wife at bedside. Safety mittens and Ativan administered prior to transfusion to relax pt and prevent him from pulling at lines.

## 2018-06-01 NOTE — Progress Notes (Signed)
Progress Note  Patient Name: Howard Thompson Date of Encounter: 06/01/2018  Primary Cardiologist: No primary care provider on file.  Subjective   More confused than yesterday but opens eyes and gives one word responses.   Inpatient Medications    Scheduled Meds: . sodium chloride   Intravenous Once  . aspirin  81 mg Oral Daily  . B-complex with vitamin C  1 tablet Per Tube Daily  . Chlorhexidine Gluconate Cloth  6 each Topical Daily  . Chlorhexidine Gluconate Cloth  6 each Topical Q0600  . famotidine  20 mg Oral Daily  . folic acid  1 mg Oral Daily  . heparin  5,000 Units Subcutaneous Q8H  . hydrocortisone sod succinate (SOLU-CORTEF) inj  50 mg Intravenous Daily  . insulin aspart  0-9 Units Subcutaneous Q4H  . mouth rinse  15 mL Mouth Rinse BID  . metoprolol tartrate  25 mg Oral BID  . multivitamin with minerals  1 tablet Oral Daily  . sodium chloride flush  10-40 mL Intracatheter Q12H  . sodium chloride flush  3 mL Intravenous Q12H  . thiamine  100 mg Oral Daily  . ticagrelor  90 mg Per Tube BID  . zinc sulfate  220 mg Oral Daily   Continuous Infusions: . sodium chloride 10 mL/hr at 05/27/18 1638  . sodium chloride Stopped (05/31/18 1413)  . sodium chloride 500 mL (05/27/18 1647)  . dexmedetomidine (PRECEDEX) IV infusion Stopped (06/01/18 0414)  . dextrose Stopped (05/25/18 0931)  . feeding supplement (VITAL AF 1.2 CAL) Stopped (05/28/18 1130)   PRN Meds: sodium chloride, sodium chloride, docusate, hydrALAZINE, LORazepam, nitroGLYCERIN, sodium chloride flush, sodium chloride flush   Vital Signs    Vitals:   06/01/18 0500 06/01/18 0530 06/01/18 0600 06/01/18 0735  BP: 135/73  (!) 116/103   Pulse: 68 (!) 59 87   Resp: 11 (!) 24 (!) 28   Temp:    (!) 97.3 F (36.3 C)  TempSrc:    Axillary  SpO2: 100% 100% 100%   Weight:   158 lb 15.2 oz (72.1 kg)   Height:        Intake/Output Summary (Last 24 hours) at 06/01/2018 0826 Last data filed at 06/01/2018 0500 Gross per  24 hour  Intake 551.02 ml  Output -  Net 551.02 ml   Filed Weights   05/30/18 0700 05/31/18 0500 06/01/18 0600  Weight: 148 lb 5.9 oz (67.3 kg) 151 lb 3.8 oz (68.6 kg) 158 lb 15.2 oz (72.1 kg)    Telemetry    SR, developed some bradycardia last night- Precedex discontinued.  - Personally Reviewed  ECG    None today.  Physical Exam   General: Thin older W, male appearing in no acute distress. On NRB Head: Normocephalic, atraumatic.  Neck: Supple, no JVD. Chest: right chest HD catheter with continuous oozing of blood. Lungs:  Resp regular and unlabored, CTA. Heart: RRR, S1, S2, no  murmur; no rub. Abdomen: Soft, non-tender, non-distended with normoactive bowel sounds. Extremities: No clubbing, cyanosis, edema. Distal pedal pulses are 2+ bilaterally. Neuro: Alert but confused, attempts to answer questions appropriately. Moves all extremities spontaneously.  Labs    Chemistry Recent Labs  Lab 05/30/18 0357 05/31/18 0409 05/31/18 1847 06/01/18 0251  NA 138 141 139 140  K 4.7 4.8 5.3* 5.5*  CL 101 105 103 103  CO2 26 24 22  20*  GLUCOSE 87 104* 104* 108*  BUN 38* 77* 105* 119*  CREATININE 2.74* 5.60* 7.20* 8.07*  CALCIUM  8.2* 8.1* 8.2* 8.3*  PROT 5.7* 5.6*  --  5.6*  ALBUMIN 2.4* 2.4*  --  2.4*  AST 130* 71*  --  52*  ALT 577* 115*  --  26  ALKPHOS 199* 172*  --  150*  BILITOT 1.4* 1.6*  --  1.2  GFRNONAA 25* 11* 8* 7*  GFRAA 29* 12* 9* 8*  ANIONGAP 11 12 14  17*     Hematology Recent Labs  Lab 05/30/18 0357 05/31/18 0409 06/01/18 0251  WBC 10.8* 9.8 14.1*  RBC 3.47* 3.25* 3.35*  HGB 9.5* 8.9* 9.1*  HCT 29.9* 28.3* 28.8*  MCV 86.2 87.1 86.0  MCH 27.4 27.4 27.2  MCHC 31.8 31.4 31.6  RDW 20.9* 21.5* 21.7*  PLT 90* 132* 152    Cardiac Enzymes Recent Labs  Lab 05/28/18 2027 05/29/18 0333 05/29/18 1445  TROPONINI 0.92* 0.87* 0.66*   No results for input(s): TROPIPOC in the last 168 hours.   BNPNo results for input(s): BNP, PROBNP in the last 168  hours.   DDimer No results for input(s): DDIMER in the last 168 hours.    Radiology    Ir Fluoro Guide Cv Line Right  Result Date: 05/31/2018 CLINICAL DATA:  Acute kidney injury, needs durable venous access for hemodialysis EXAM: TUNNELED HEMODIALYSIS CATHETER PLACEMENT WITH ULTRASOUND AND FLUOROSCOPIC GUIDANCE TECHNIQUE: The procedure, risks, benefits, and alternatives were explained to the patient and family. Questions regarding the procedure were encouraged and answered. The family understands and consents to the procedure. As antibiotic prophylaxis, cefazolin 2 g was ordered pre-procedure and administered intravenously within one hour of incision.Patency of the right IJ vein was confirmed with ultrasound with image documentation. An appropriate skin site was determined. Region was prepped using maximum barrier technique including cap and mask, sterile gown, sterile gloves, large sterile sheet, and Chlorhexidine as cutaneous antisepsis. The region was infiltrated locally with 1% lidocaine. Intravenous Fentanyl and Versed were administered as conscious sedation during continuous monitoring of the patient's level of consciousness and physiological / cardiorespiratory status by the radiology RN, with a total moderate sedation time of 33 minutes. Under real-time ultrasound guidance, the right IJ vein was accessed with a 21 gauge micropuncture needle; the needle tip within the vein was confirmed with ultrasound image documentation. Needle exchanged over the 018 guidewire for transitional dilator, which allowed advancement of a Benson wire into the IVC. Over this, an MPA catheter was advanced. A Palindrome 23 hemodialysis catheter was tunneled from the right anterior chest wall approach to the right IJ dermatotomy site. The MPA catheter was exchanged over an Amplatz wire for serial vascular dilators which allow placement of a peel-away sheath, through which the catheter was advanced under intermittent  fluoroscopy, positioned with its tips in the proximal and midright atrium. Spot chest radiograph confirms good catheter position. No pneumothorax. Catheter was flushed and primed per protocol. Catheter secured externally with O Prolene sutures. The right IJ dermatotomy site was closed with Dermabond. COMPLICATIONS: COMPLICATIONS None immediate FLUOROSCOPY TIME:  24 seconds; 2 mGy COMPARISON:  None IMPRESSION: 1. Technically successful placement of tunneled right IJ hemodialysis catheter with ultrasound and fluoroscopic guidance. Ready for routine use. ACCESS: Remains approachable for percutaneous intervention as needed. Electronically Signed   By: Lucrezia Europe M.D.   On: 05/31/2018 16:47   Ir US Guide Vasc Access Right  Result Date: 05/31/2018 CLINICAL DATA:  Acute kidney injury, needs durable venous access for hemodialysis EXAM: TUNNELED HEMODIALYSIS CATHETER PLACEMENT WITH ULTRASOUND AND FLUOROSCOPIC GUIDANCE TECHNIQUE: The procedure, risks,  benefits, and alternatives were explained to the patient and family. Questions regarding the procedure were encouraged and answered. The family understands and consents to the procedure. As antibiotic prophylaxis, cefazolin 2 g was ordered pre-procedure and administered intravenously within one hour of incision.Patency of the right IJ vein was confirmed with ultrasound with image documentation. An appropriate skin site was determined. Region was prepped using maximum barrier technique including cap and mask, sterile gown, sterile gloves, large sterile sheet, and Chlorhexidine as cutaneous antisepsis. The region was infiltrated locally with 1% lidocaine. Intravenous Fentanyl and Versed were administered as conscious sedation during continuous monitoring of the patient's level of consciousness and physiological / cardiorespiratory status by the radiology RN, with a total moderate sedation time of 33 minutes. Under real-time ultrasound guidance, the right IJ vein was accessed  with a 21 gauge micropuncture needle; the needle tip within the vein was confirmed with ultrasound image documentation. Needle exchanged over the 018 guidewire for transitional dilator, which allowed advancement of a Benson wire into the IVC. Over this, an MPA catheter was advanced. A Palindrome 23 hemodialysis catheter was tunneled from the right anterior chest wall approach to the right IJ dermatotomy site. The MPA catheter was exchanged over an Amplatz wire for serial vascular dilators which allow placement of a peel-away sheath, through which the catheter was advanced under intermittent fluoroscopy, positioned with its tips in the proximal and midright atrium. Spot chest radiograph confirms good catheter position. No pneumothorax. Catheter was flushed and primed per protocol. Catheter secured externally with O Prolene sutures. The right IJ dermatotomy site was closed with Dermabond. COMPLICATIONS: COMPLICATIONS None immediate FLUOROSCOPY TIME:  24 seconds; 2 mGy COMPARISON:  None IMPRESSION: 1. Technically successful placement of tunneled right IJ hemodialysis catheter with ultrasound and fluoroscopic guidance. Ready for routine use. ACCESS: Remains approachable for percutaneous intervention as needed. Electronically Signed   By: Lucrezia Europe M.D.   On: 05/31/2018 16:47   Dg Swallowing Func-speech Pathology  Result Date: 05/30/2018 Objective Swallowing Evaluation: Type of Study: Bedside Swallow Evaluation  Patient Details Name: Kingsly Kloepfer MRN: [id number] Date of Birth: [birth year] Today's Date: 05/30/2018 Time: SLP Start Time (ACUTE ONLY): 1115 -SLP Stop Time (ACUTE ONLY): 1138 SLP Time Calculation (min) (ACUTE ONLY): 23 min Past Medical History: No past medical history on file. Past Surgical History: Past Surgical History: Procedure Laterality Date . CORONARY STENT INTERVENTION N/A 05/22/2018  Procedure: CORONARY STENT INTERVENTION;  Surgeon: Lorretta Harp, MD;  Location: [place] [facility];  Service:  Cardiovascular;  Laterality: N/A; . LEFT HEART CATH AND CORONARY ANGIOGRAPHY N/A 05/22/2018  Procedure: LEFT HEART CATH AND CORONARY ANGIOGRAPHY;  Surgeon: Lorretta Harp, MD;  Location: [place];  Service: Cardiovascular;  Laterality: N/A; HPI: Jazen Spraggins is a 52 y.o. male admitted on 7/27 s/p unwitnessed out of hospital cardiac arrest, VF, unknown downtime, past medical history of coronary artery disease, myasthenia gravis, hypertension.  Per wife, pt has had hx of dysphagia related to MG which is largely resolved.  Pt was intubated from 7/27 - 8/2.  Subjective: The patient was seen in radiology for instrumental exam with nurse present.  Assessment / Plan / Recommendation CHL IP CLINICAL IMPRESSIONS 05/30/2018 Clinical Impression MBS was completed using thin liquids via spoon and straw, nectar thick liquids via cup sip, pureed material and solids.  The patient presented with a mild pharyngeal dysphagia.  His biggest risk factor for aspiration is his current mentation and ability to attend to task.  At times, he  held material orally requiring cue to swallow.  This was related to his current level of confusion rather then a true oral dysphagia.  The pharyngeal phase was marked by mildly reduced base of tongue retraction which led to mild vallecula residue across textures.  There was one episode of penetration to the level of the vocal cords given a straw sip of thin liquids following presentation of regular solids. The patient required cue to cough and reswallow that was minimally successful most likely due to lack of effort on the his part due to difficulty fully following commands.   Esophageal sweep revealed it slow to clear with liquid wash being helpful to facilitate clearance.  Recommend continue with a pureed diet with thin liquids.  The patient MUST be sat totally upright for all intake and should eat/drink slowly.  As mentation clears he can most likely be advanced to regular solids.  As stated  above his biggest risk factor for aspiration is his current mentation and decreased attention to task  ST will follow for therapeutic diet tolerance and possible diet advancement as mentation hopefully clears.  MD please consider cognitive/liguistic evaluation. SLP Visit Diagnosis Dysphagia, oropharyngeal phase (R13.12) Attention and concentration deficit following -- Frontal lobe and executive function deficit following -- Impact on safety and function Mild aspiration risk   CHL IP TREATMENT RECOMMENDATION 05/30/2018 Treatment Recommendations Therapy as outlined in treatment plan below   Prognosis 05/30/2018 Prognosis for Safe Diet Advancement Good Barriers to Reach Goals Cognitive deficits Barriers/Prognosis Comment -- CHL IP DIET RECOMMENDATION 05/30/2018 SLP Diet Recommendations Dysphagia 1 (Puree) solids;Thin liquid Liquid Administration via Cup;Straw Medication Administration Whole meds with puree Compensations Minimize environmental distractions;Slow rate;Small sips/bites Postural Changes Seated upright at 90 degrees;Remain semi-upright after after feeds/meals (Comment)   CHL IP OTHER RECOMMENDATIONS 05/30/2018 Recommended Consults -- Oral Care Recommendations Oral care before and after PO Other Recommendations --   CHL IP FOLLOW UP RECOMMENDATIONS 05/30/2018 Follow up Recommendations Other (comment)   CHL IP FREQUENCY AND DURATION 05/30/2018 Speech Therapy Frequency (ACUTE ONLY) min 2x/week Treatment Duration 2 weeks      CHL IP ORAL PHASE 05/30/2018 Oral Phase WFL Oral - Pudding Teaspoon -- Oral - Pudding Cup -- Oral - Honey Teaspoon -- Oral - Honey Cup -- Oral - Nectar Teaspoon -- Oral - Nectar Cup -- Oral - Nectar Straw -- Oral - Thin Teaspoon -- Oral - Thin Cup -- Oral - Thin Straw -- Oral - Puree -- Oral - Mech Soft -- Oral - Regular -- Oral - Multi-Consistency -- Oral - Pill -- Oral Phase - Comment --  CHL IP PHARYNGEAL PHASE 05/30/2018 Pharyngeal Phase Impaired Pharyngeal- Pudding Teaspoon -- Pharyngeal --  Pharyngeal- Pudding Cup -- Pharyngeal -- Pharyngeal- Honey Teaspoon -- Pharyngeal -- Pharyngeal- Honey Cup -- Pharyngeal -- Pharyngeal- Nectar Teaspoon -- Pharyngeal -- Pharyngeal- Nectar Cup Delayed swallow initiation-vallecula;Reduced tongue base retraction;Pharyngeal residue - valleculae Pharyngeal -- Pharyngeal- Nectar Straw -- Pharyngeal -- Pharyngeal- Thin Teaspoon Delayed swallow initiation-pyriform sinuses;Reduced epiglottic inversion;Reduced tongue base retraction;Pharyngeal residue - valleculae Pharyngeal -- Pharyngeal- Thin Cup -- Pharyngeal -- Pharyngeal- Thin Straw Delayed swallow initiation-pyriform sinuses;Reduced epiglottic inversion;Penetration/Aspiration during swallow;Reduced tongue base retraction;Pharyngeal residue - valleculae Pharyngeal Material enters airway, CONTACTS cords and not ejected out Pharyngeal- Puree Reduced tongue base retraction;Pharyngeal residue - valleculae Pharyngeal -- Pharyngeal- Mechanical Soft -- Pharyngeal -- Pharyngeal- Regular Reduced tongue base retraction;Pharyngeal residue - valleculae Pharyngeal -- Pharyngeal- Multi-consistency -- Pharyngeal -- Pharyngeal- Pill -- Pharyngeal -- Pharyngeal Comment --  CHL IP CERVICAL ESOPHAGEAL  PHASE 05/30/2018 Cervical Esophageal Phase WFL Pudding Teaspoon -- Pudding Cup -- Honey Teaspoon -- Honey Cup -- Nectar Teaspoon -- Nectar Cup -- Nectar Straw -- Thin Teaspoon -- Thin Cup -- Thin Straw -- Puree -- Mechanical Soft -- Regular -- Multi-consistency -- Pill -- Cervical Esophageal Comment -- Shelly Flatten, MA, CCC-SLP Acute Rehab SLP [phone] [name] 05/30/2018, 11:57 AM               Cardiac Studies         Post-Intervention Diagram          ------------------------------------------------------------------- ECHO Study Conclusions  - Left ventricle: The cavity size was normal. Systolic function was severely reduced. The estimated ejection fraction was in the range of 25% to 30%. Severe  diffuse hypokinesis with regional variations. There is akinesis of the apicalanterior and apical myocardium. There is akinesis of the midanteroseptal myocardium. There is severe hypokinesis of the inferoseptal myocardium. Features are consistent with a pseudonormal left ventricular filling pattern, with concomitant abnormal relaxation and increased filling pressure (grade 2 diastolic dysfunction). Doppler parameters are consistent with high ventricular filling pressure. - Aortic valve: A bicuspid morphology cannot be excluded; mildly thickened, mildly calcified leaflets. There was mild to moderate regurgitation. Regurgitation pressure half-time: 718 ms. - Mitral valve: There was mild to moderate regurgitation. Valve area by pressure half-time: 2.39 cm^2. - Right ventricle: Systolic function was moderately reduced. - Atrial septum: There was increased thickness of the septum, consistent with lipomatous hypertrophy. - Tricuspid valve: There was mild regurgitation. - Pulmonary arteries: PA peak pressure: 33 mm Hg (S). - Inferior vena cava: The vessel was dilated. The respirophasic diameter changes were blunted (<50%), consistent with elevated central venous pressure. - Pericardium, extracardiac: A small, free-flowing pericardial effusion was identified circumferential to the heart. The fluid had no internal echoes.There was no evidence of hemodynamic compromise.  Impressions:  - severe LV dysfunction with diffuse HK but apica/anteroseptal and apical anterior AK. Recommend definity contrast study to rule out apical thrombus.  Patient Profile     52 y.o. male out of hospital cardiac arrest, prior history of MI several years ago, myasthenia gravis post thymus resection, anterior ST elevation post LAD stent. Extubated on 8/2.   Assessment & Plan    1. Cardiac arrest: Underwent cath with PCI/DES x1 to the pLAD on 05/22/18.  Plan for DAPT with  ASA/Brilinta for 12 months. ASA was held in the setting of thrombocytopenia, but has since been resumed. Platelets 152K today. He has been taking oral meds. On metoprolol 25mg  BID. Statin held 2/2 to elevated LFTs.   2. Cardiogenic shock: Had been on both epi and levo, but now maintaining blood pressures without. Has been on BB with stable blood pressure.   3. ICM: EF noted at 25-30%. Switched IV BB to PO yesterday. No room for ARB/ACEi given AKF. No signs of volume overload.   4. Anoxic encephalopathy: EEG was abnormal but repeat now improving. Not as alert/responsive today off Precedex - this was stopped due to bradycardia which has resolved.  5. Acute renal failure: Nephrology following. S/p placement of new dialysis catheter right subclavian. Femoral catheter out.   6. Polysubstance abuse: + for cocaine on admission.   Signed, Robertson Colclough [place], MD [facility] 06/01/2018, 8:26 AM    For questions or updates, please contact [place] Please consult [email or web address] for contact info under Cardiology/STEMI.

## 2018-06-02 LAB — CBC WITH DIFFERENTIAL/PLATELET
Abs Immature Granulocytes: 0.1 10*3/uL (ref 0.0–0.1)
Abs Immature Granulocytes: 0.1 10*3/uL (ref 0.0–0.1)
Abs Immature Granulocytes: 0.1 10*3/uL (ref 0.0–0.1)
BASOS ABS: 0 10*3/uL (ref 0.0–0.1)
BASOS PCT: 0 %
BASOS PCT: 0 %
Basophils Absolute: 0 10*3/uL (ref 0.0–0.1)
Basophils Absolute: 0 10*3/uL (ref 0.0–0.1)
Basophils Absolute: 0 10*3/uL (ref 0.0–0.1)
Basophils Relative: 0 %
Basophils Relative: 0 %
EOS ABS: 0 10*3/uL (ref 0.0–0.7)
EOS PCT: 0 %
EOS PCT: 0 %
EOS PCT: 0 %
Eosinophils Absolute: 0 10*3/uL (ref 0.0–0.7)
Eosinophils Absolute: 0 10*3/uL (ref 0.0–0.7)
Eosinophils Absolute: 0 10*3/uL (ref 0.0–0.7)
Eosinophils Relative: 0 %
HCT: 21.6 % — ABNORMAL LOW (ref 39.0–52.0)
HEMATOCRIT: 21 % — AB (ref 39.0–52.0)
HEMATOCRIT: 23.6 % — AB (ref 39.0–52.0)
HEMATOCRIT: 20.8 % — AB (ref 39.0–52.0)
HEMOGLOBIN: 6.7 g/dL — AB (ref 13.0–17.0)
HEMOGLOBIN: 7.2 g/dL — AB (ref 13.0–17.0)
HEMOGLOBIN: 6.8 g/dL — AB (ref 13.0–17.0)
Hemoglobin: 7.8 g/dL — ABNORMAL LOW (ref 13.0–17.0)
IMMATURE GRANULOCYTES: 1 %
Immature Granulocytes: 1 %
Immature Granulocytes: 1 %
LYMPHS ABS: 0.7 10*3/uL (ref 0.7–4.0)
LYMPHS ABS: 0.5 10*3/uL — AB (ref 0.7–4.0)
LYMPHS PCT: 5 %
Lymphocytes Relative: 6 %
Lymphocytes Relative: 5 %
Lymphocytes Relative: 5 %
Lymphs Abs: 0.7 10*3/uL (ref 0.7–4.0)
Lymphs Abs: 0.6 10*3/uL — ABNORMAL LOW (ref 0.7–4.0)
MCH: 27.3 pg (ref 26.0–34.0)
MCH: 28.8 pg (ref 26.0–34.0)
MCH: 28.7 pg (ref 26.0–34.0)
MCH: 28.1 pg (ref 26.0–34.0)
MCHC: 31.9 g/dL (ref 30.0–36.0)
MCHC: 33.1 g/dL (ref 30.0–36.0)
MCHC: 33.3 g/dL (ref 30.0–36.0)
MCHC: 32.7 g/dL (ref 30.0–36.0)
MCV: 85.7 fL (ref 78.0–100.0)
MCV: 87.1 fL (ref 78.0–100.0)
MCV: 86.1 fL (ref 78.0–100.0)
MCV: 86 fL (ref 78.0–100.0)
MONO ABS: 0.9 10*3/uL (ref 0.1–1.0)
MONO ABS: 0.8 10*3/uL (ref 0.1–1.0)
MONOS PCT: 6 %
MONOS PCT: 7 %
MONOS PCT: 8 %
Monocytes Absolute: 0.7 10*3/uL (ref 0.1–1.0)
Monocytes Absolute: 0.8 10*3/uL (ref 0.1–1.0)
Monocytes Relative: 7 %
NEUTROS ABS: 10.3 10*3/uL — AB (ref 1.7–7.7)
NEUTROS ABS: 9.1 10*3/uL — AB (ref 1.7–7.7)
NEUTROS PCT: 87 %
Neutro Abs: 10.9 10*3/uL — ABNORMAL HIGH (ref 1.7–7.7)
Neutro Abs: 9.7 10*3/uL — ABNORMAL HIGH (ref 1.7–7.7)
Neutrophils Relative %: 88 %
Neutrophils Relative %: 86 %
Neutrophils Relative %: 87 %
Platelets: 144 10*3/uL — ABNORMAL LOW (ref 150–400)
Platelets: 139 10*3/uL — ABNORMAL LOW (ref 150–400)
Platelets: 151 10*3/uL (ref 150–400)
Platelets: 141 10*3/uL — ABNORMAL LOW (ref 150–400)
RBC: 2.45 MIL/uL — ABNORMAL LOW (ref 4.22–5.81)
RBC: 2.71 MIL/uL — ABNORMAL LOW (ref 4.22–5.81)
RBC: 2.51 MIL/uL — ABNORMAL LOW (ref 4.22–5.81)
RBC: 2.42 MIL/uL — ABNORMAL LOW (ref 4.22–5.81)
RDW: 21.3 % — ABNORMAL HIGH (ref 11.5–15.5)
RDW: 19.9 % — AB (ref 11.5–15.5)
RDW: 20 % — ABNORMAL HIGH (ref 11.5–15.5)
RDW: 20.1 % — ABNORMAL HIGH (ref 11.5–15.5)
WBC: 11.7 10*3/uL — ABNORMAL HIGH (ref 4.0–10.5)
WBC: 12.5 10*3/uL — ABNORMAL HIGH (ref 4.0–10.5)
WBC: 10.5 10*3/uL (ref 4.0–10.5)
WBC: 11.2 10*3/uL — ABNORMAL HIGH (ref 4.0–10.5)

## 2018-06-02 LAB — PREPARE RBC (CROSSMATCH)

## 2018-06-02 LAB — BASIC METABOLIC PANEL
ANION GAP: 14 (ref 5–15)
ANION GAP: 14 (ref 5–15)
BUN: 86 mg/dL — AB (ref 6–20)
BUN: 104 mg/dL — AB (ref 6–20)
CALCIUM: 8.1 mg/dL — AB (ref 8.9–10.3)
CHLORIDE: 98 mmol/L (ref 98–111)
CO2: 24 mmol/L (ref 22–32)
CO2: 23 mmol/L (ref 22–32)
CREATININE: 8.62 mg/dL — AB (ref 0.61–1.24)
Calcium: 7.8 mg/dL — ABNORMAL LOW (ref 8.9–10.3)
Chloride: 101 mmol/L (ref 98–111)
Creatinine, Ser: 7.1 mg/dL — ABNORMAL HIGH (ref 0.61–1.24)
GFR calc Af Amer: 9 mL/min — ABNORMAL LOW (ref >60)
GFR calc Af Amer: 7 mL/min — ABNORMAL LOW (ref >60)
GFR calc non Af Amer: 6 mL/min — ABNORMAL LOW (ref >60)
GFR, EST NON AFRICAN AMERICAN: 8 mL/min — AB (ref >60)
GLUCOSE: 84 mg/dL (ref 70–99)
Glucose, Bld: 98 mg/dL (ref 70–99)
POTASSIUM: 5.2 mmol/L — AB (ref 3.5–5.1)
Potassium: 5.7 mmol/L — ABNORMAL HIGH (ref 3.5–5.1)
SODIUM: 136 mmol/L (ref 135–145)
Sodium: 138 mmol/L (ref 135–145)

## 2018-06-02 LAB — HEPATIC FUNCTION PANEL
ALK PHOS: 138 U/L — AB (ref 38–126)
ALT: 9 U/L (ref 0–44)
AST: 45 U/L — AB (ref 15–41)
Albumin: 2.4 g/dL — ABNORMAL LOW (ref 3.5–5.0)
Bilirubin, Direct: 0.3 mg/dL — ABNORMAL HIGH (ref 0.0–0.2)
Indirect Bilirubin: 0.9 mg/dL (ref 0.3–0.9)
TOTAL PROTEIN: 5.4 g/dL — AB (ref 6.5–8.1)
Total Bilirubin: 1.2 mg/dL (ref 0.3–1.2)

## 2018-06-02 LAB — MAGNESIUM: Magnesium: 3 mg/dL — ABNORMAL HIGH (ref 1.7–2.4)

## 2018-06-02 LAB — GLUCOSE, CAPILLARY
Glucose-Capillary: 88 mg/dL (ref 70–99)
Glucose-Capillary: 87 mg/dL (ref 70–99)
Glucose-Capillary: 79 mg/dL (ref 70–99)

## 2018-06-02 LAB — HEPATITIS B CORE ANTIBODY, TOTAL: HEP B C TOTAL AB: NEGATIVE

## 2018-06-02 LAB — HEPATITIS B SURFACE ANTIBODY, QUANTITATIVE: Hepatitis B-Post: 7.6 m[IU]/mL — ABNORMAL LOW (ref >9.9)

## 2018-06-02 LAB — HEPATITIS C ANTIBODY: HCV Ab: <0.1 s/co ratio (ref 0.0–0.9)

## 2018-06-02 LAB — PHOSPHORUS: PHOSPHORUS: 6.3 mg/dL — AB (ref 2.5–4.6)

## 2018-06-02 LAB — HEPATITIS B SURFACE ANTIGEN: HEP B S AG: NEGATIVE

## 2018-06-02 MED ORDER — GELATIN ABSORBABLE 12-7 MM EX MISC
CUTANEOUS | Status: AC
  Filled 2018-06-02: qty 1

## 2018-06-02 MED ORDER — SODIUM CHLORIDE 0.9% IV SOLUTION: Freq: Once | INTRAVENOUS | Status: DC

## 2018-06-02 MED ORDER — "THROMBI-PAD 3""X3"" EX PADS"
3.0000 | MEDICATED_PAD | Freq: Once | CUTANEOUS | Status: DC
  Filled 2018-06-02: qty 3

## 2018-06-02 MED ORDER — GERHARDT'S BUTT CREAM
TOPICAL_CREAM | CUTANEOUS | Status: DC | PRN
Start: 2018-06-02 — End: 2018-06-21
  Filled 2018-06-02 (×2): qty 1

## 2018-06-02 MED ORDER — SODIUM CHLORIDE 0.9% IV SOLUTION
Freq: Once | INTRAVENOUS | Status: AC
  Administered 2018-06-03: 01:00:00 via INTRAVENOUS

## 2018-06-02 MED ORDER — LIDOCAINE-EPINEPHRINE (PF) 1 %-1:200000 IJ SOLN
INTRAMUSCULAR | Status: AC
  Filled 2018-06-02: qty 30

## 2018-06-02 NOTE — Progress Notes (Signed)
Progress Note  Patient Name: Yaron Grasse Date of Encounter: 06/02/2018  Primary Cardiologist: No primary care provider on file.  Subjective   Confused and agigtated this morning. Now in restraints. Had significant bleeding from HD temp cath site and dropped his Hgb to 6. Family at the bedside.    Inpatient Medications    Scheduled Meds: . sodium chloride   Intravenous Once  . aspirin  81 mg Oral Daily  . B-complex with vitamin C  1 tablet Per Tube Daily  . Chlorhexidine Gluconate Cloth  6 each Topical Daily  . Chlorhexidine Gluconate Cloth  6 each Topical Q0600  . famotidine  20 mg Oral Daily  . feeding supplement (ENSURE ENLIVE)  237 mL Oral TID BM  . folic acid  1 mg Oral Daily  . gelatin adsorbable      . insulin aspart  0-9 Units Subcutaneous Q4H  . lidocaine-EPINEPHrine      . mouth rinse  15 mL Mouth Rinse BID  . metoprolol tartrate  25 mg Oral BID  . multivitamin with minerals  1 tablet Oral Daily  . QUEtiapine  25 mg Oral BID  . sodium chloride flush  3 mL Intravenous Q12H  . thiamine  100 mg Oral Daily  . THROMBI-PAD  3 each Topical Once  . ticagrelor  90 mg Per Tube BID  . zinc sulfate  220 mg Oral Daily   Continuous Infusions: . sodium chloride 500 mL (05/27/18 1647)  . sodium chloride    . sodium chloride     PRN Meds: sodium chloride, sodium chloride, sodium chloride, alteplase, docusate, hydrALAZINE, lidocaine (PF), lidocaine-prilocaine, LORazepam, nitroGLYCERIN, pentafluoroprop-tetrafluoroeth, sodium chloride flush   Vital Signs    Vitals:   06/02/18 0440 06/02/18 0600 06/02/18 0625 06/02/18 1107  BP:  (!) 153/78 (!) 159/78 (!) 172/80  Pulse:  (!) 107 96 99  Resp:  19 16 20   Temp:  98.4 F (36.9 C) 98.3 F (36.8 C) (!) 87.8 F (31 C)  TempSrc:  Oral Oral Oral  SpO2:  99% 98% 96%  Weight: 145 lb 8.1 oz (66 kg)     Height:        Intake/Output Summary (Last 24 hours) at 06/02/2018 1113 Last data filed at 06/02/2018 0745 Gross per 24 hour    Intake 980 ml  Output 2300 ml  Net -1320 ml   Filed Weights   06/01/18 1410 06/01/18 1630 06/02/18 0440  Weight: 159 lb 2.8 oz (72.2 kg) 154 lb 5.2 oz (70 kg) 145 lb 8.1 oz (66 kg)    Telemetry    SR - Personally Reviewed  ECG    ST - Personally Reviewed  Physical Exam   General: confused, thin older W male appearing in no acute distress. Head: Normocephalic, atraumatic.  Neck: Supple, no JVD. Lungs:  Resp regular, but slightly labored, CTA. Heart: RRR, S1, S2, systolic murmur; no rub. HD temp cath site dressing dry.  Abdomen: Soft, non-tender, non-distended with normoactive bowel sounds.  Extremities: No clubbing, cyanosis, edema. Distal pedal pulses are 2+ bilaterally. Neuro: Disoriented, and altered. Moves all extremities spontaneously.  Labs    Chemistry Recent Labs  Lab 05/31/18 0409 05/31/18 1847 06/01/18 0251 06/02/18 0240  NA 141 139 140 136  K 4.8 5.3* 5.5* 5.2*  CL 105 103 103 98  CO2 24 22 20* 24  GLUCOSE 104* 104* 108* 98  BUN 77* 105* 119* 86*  CREATININE 5.60* 7.20* 8.07* 7.10*  CALCIUM 8.1* 8.2* 8.3* 7.8*  PROT 5.6*  --  5.6* 5.4*  ALBUMIN 2.4*  --  2.4* 2.4*  AST 71*  --  52* 45*  ALT 115*  --  26 9  ALKPHOS 172*  --  150* 138*  BILITOT 1.6*  --  1.2 1.2  GFRNONAA 11* 8* 7* 8*  GFRAA 12* 9* 8* 9*  ANIONGAP 12 14 17* 14     Hematology Recent Labs  Lab 06/01/18 2231 06/02/18 0240 06/02/18 0951  WBC 13.7* 11.7* 12.5*  RBC 2.77* 2.45* 2.71*  HGB 7.6* 6.7* 7.8*  HCT 24.0* 21.0* 23.6*  MCV 86.6 85.7 87.1  MCH 27.4 27.3 28.8  MCHC 31.7 31.9 33.1  RDW 21.4* 21.3* 19.9*  PLT 147* 144* 139*    Cardiac Enzymes Recent Labs  Lab 05/28/18 2027 05/29/18 0333 05/29/18 1445  TROPONINI 0.92* 0.87* 0.66*   No results for input(s): TROPIPOC in the last 168 hours.   BNPNo results for input(s): BNP, PROBNP in the last 168 hours.   DDimer No results for input(s): DDIMER in the last 168 hours.    Radiology    Ir Fluoro Guide Cv Line  Right  Result Date: 05/31/2018 CLINICAL DATA:  Acute kidney injury, needs durable venous access for hemodialysis EXAM: TUNNELED HEMODIALYSIS CATHETER PLACEMENT WITH ULTRASOUND AND FLUOROSCOPIC GUIDANCE TECHNIQUE: The procedure, risks, benefits, and alternatives were explained to the patient and family. Questions regarding the procedure were encouraged and answered. The family understands and consents to the procedure. As antibiotic prophylaxis, cefazolin 2 g was ordered pre-procedure and administered intravenously within one hour of incision.Patency of the right IJ vein was confirmed with ultrasound with image documentation. An appropriate skin site was determined. Region was prepped using maximum barrier technique including cap and mask, sterile gown, sterile gloves, large sterile sheet, and Chlorhexidine as cutaneous antisepsis. The region was infiltrated locally with 1% lidocaine. Intravenous Fentanyl and Versed were administered as conscious sedation during continuous monitoring of the patient's level of consciousness and physiological / cardiorespiratory status by the radiology RN, with a total moderate sedation time of 33 minutes. Under real-time ultrasound guidance, the right IJ vein was accessed with a 21 gauge micropuncture needle; the needle tip within the vein was confirmed with ultrasound image documentation. Needle exchanged over the 018 guidewire for transitional dilator, which allowed advancement of a Benson wire into the IVC. Over this, an MPA catheter was advanced. A Palindrome 23 hemodialysis catheter was tunneled from the right anterior chest wall approach to the right IJ dermatotomy site. The MPA catheter was exchanged over an Amplatz wire for serial vascular dilators which allow placement of a peel-away sheath, through which the catheter was advanced under intermittent fluoroscopy, positioned with its tips in the proximal and midright atrium. Spot chest radiograph confirms good catheter  position. No pneumothorax. Catheter was flushed and primed per protocol. Catheter secured externally with O Prolene sutures. The right IJ dermatotomy site was closed with Dermabond. COMPLICATIONS: COMPLICATIONS None immediate FLUOROSCOPY TIME:  24 seconds; 2 mGy COMPARISON:  None IMPRESSION: 1. Technically successful placement of tunneled right IJ hemodialysis catheter with ultrasound and fluoroscopic guidance. Ready for routine use. ACCESS: Remains approachable for percutaneous intervention as needed. Electronically Signed   By: Lucrezia Europe M.D.   On: 05/31/2018 16:47   Ir US Guide Vasc Access Right  Result Date: 05/31/2018 CLINICAL DATA:  Acute kidney injury, needs durable venous access for hemodialysis EXAM: TUNNELED HEMODIALYSIS CATHETER PLACEMENT WITH ULTRASOUND AND FLUOROSCOPIC GUIDANCE TECHNIQUE: The procedure, risks, benefits, and alternatives were explained  to the patient and family. Questions regarding the procedure were encouraged and answered. The family understands and consents to the procedure. As antibiotic prophylaxis, cefazolin 2 g was ordered pre-procedure and administered intravenously within one hour of incision.Patency of the right IJ vein was confirmed with ultrasound with image documentation. An appropriate skin site was determined. Region was prepped using maximum barrier technique including cap and mask, sterile gown, sterile gloves, large sterile sheet, and Chlorhexidine as cutaneous antisepsis. The region was infiltrated locally with 1% lidocaine. Intravenous Fentanyl and Versed were administered as conscious sedation during continuous monitoring of the patient's level of consciousness and physiological / cardiorespiratory status by the radiology RN, with a total moderate sedation time of 33 minutes. Under real-time ultrasound guidance, the right IJ vein was accessed with a 21 gauge micropuncture needle; the needle tip within the vein was confirmed with ultrasound image documentation.  Needle exchanged over the 018 guidewire for transitional dilator, which allowed advancement of a Benson wire into the IVC. Over this, an MPA catheter was advanced. A Palindrome 23 hemodialysis catheter was tunneled from the right anterior chest wall approach to the right IJ dermatotomy site. The MPA catheter was exchanged over an Amplatz wire for serial vascular dilators which allow placement of a peel-away sheath, through which the catheter was advanced under intermittent fluoroscopy, positioned with its tips in the proximal and midright atrium. Spot chest radiograph confirms good catheter position. No pneumothorax. Catheter was flushed and primed per protocol. Catheter secured externally with O Prolene sutures. The right IJ dermatotomy site was closed with Dermabond. COMPLICATIONS: COMPLICATIONS None immediate FLUOROSCOPY TIME:  24 seconds; 2 mGy COMPARISON:  None IMPRESSION: 1. Technically successful placement of tunneled right IJ hemodialysis catheter with ultrasound and fluoroscopic guidance. Ready for routine use. ACCESS: Remains approachable for percutaneous intervention as needed. Electronically Signed   By: Lucrezia Europe M.D.   On: 05/31/2018 16:47    Cardiac Studies        Post-Intervention Diagram          ------------------------------------------------------------------- ECHO Study Conclusions  - Left ventricle: The cavity size was normal. Systolic function was severely reduced. The estimated ejection fraction was in the range of 25% to 30%. Severe diffuse hypokinesis with regional variations. There is akinesis of the apicalanterior and apical myocardium. There is akinesis of the midanteroseptal myocardium. There is severe hypokinesis of the inferoseptal myocardium. Features are consistent with a pseudonormal left ventricular filling pattern, with concomitant abnormal relaxation and increased filling pressure (grade 2 diastolic dysfunction). Doppler  parameters are consistent with high ventricular filling pressure. - Aortic valve: A bicuspid morphology cannot be excluded; mildly thickened, mildly calcified leaflets. There was mild to moderate regurgitation. Regurgitation pressure half-time: 718 ms. - Mitral valve: There was mild to moderate regurgitation. Valve area by pressure half-time: 2.39 cm^2. - Right ventricle: Systolic function was moderately reduced. - Atrial septum: There was increased thickness of the septum, consistent with lipomatous hypertrophy. - Tricuspid valve: There was mild regurgitation. - Pulmonary arteries: PA peak pressure: 33 mm Hg (S). - Inferior vena cava: The vessel was dilated. The respirophasic diameter changes were blunted (<50%), consistent with elevated central venous pressure. - Pericardium, extracardiac: A small, free-flowing pericardial effusion was identified circumferential to the heart. The fluid had no internal echoes.There was no evidence of hemodynamic compromise.  Impressions:  - severe LV dysfunction with diffuse HK but apica/anteroseptal and apical anterior AK. Recommend definity contrast study to rule out apical thrombus.  Patient Profile     52  y.o. male out of hospital cardiac arrest, prior history of MI several years ago, myasthenia gravis post thymus resection, anterior ST elevation post LAD stent. Extubated on 8/2.   Assessment & Plan    1. Cardiac arrest: Underwent cath with PCI/DES x1 to the pLAD on 05/22/18.  Plan for DAPT with ASA/Brilinta for 12 months. ASA was held in the setting of thrombocytopenia, but has since been resumed. Platelets 132K today. He has been taking oral meds but now confused this morning. On metoprolol 25mg  BID. Statin held 2/2 to elevated LFTs. Hopefully with be able to continue with PO meds.   2. Cardiogenic shock: Had been on both epi and levo, but now maintaining blood pressures without. Has been on BB with stable blood  pressure.   3. ICM: EF noted at 25-30%. Switched IV BB to PO. No room for ARB/ACEi given AKF. No signs of volume overload. Consider switching to coreg given his low EF, but will wait until more stable.   4. Anoxic encephalopathy: EEG was abnormal but repeat now improving. Not as alert/responsive today off Precedex - this was stopped due to bradycardia which has resolved. Confused and agitated again this morning. Now in restraints.   5. Acute renal failure: Nephrology following. S/p placement of new dialysis catheter right subclavian. Femoral catheter out. Has significant bleeding from HD cath overnight. Dressing now dry this morning.   6. Polysubstance abuse: + for cocaine on admission.   7. Acute blood loss anemia 2/2 to bleeding HD cath: Hgb dropped to 6, now s/p 2 units of PRBCs. Hgb up to 7.8 as of 10am. Will discuss with MD regarding further need for additional units. Difficult situation with his bleeding and recent LAD stent requiring Brilinta and ASA. At this time would continue with Brilinta as his risk in in stent thrombosis is high.  -- CBC recheck at 4pm today  Signed, Reino Bellis, NP  06/02/2018, 11:13 AM  Pager # [phone]   For questions or updates, please contact [place] Please consult [email or web address] for contact info under Cardiology/STEMI.

## 2018-06-02 NOTE — Progress Notes (Signed)
PROGRESS NOTE    Howard Thompson  UYQ:[id number] DOB: May 01, 1966 DOA: 05/22/2018 PCP: No primary care provider on file.  Outpatient Specialists:     Brief Narrative:  Patient is a 52 year old Caucasian male, with past medical history significant for coronary artery disease, myasthenia gravis, hypertension, cocaine abuse and alcohol abuse.  Patient is also stated to be undergoing stressful.  Apparently, patient has recently separated from the wife.  Patient was admitted on7/27/2019s/p unwitnessed out of hospital cardiac arrest, VF, unknown downtime. Per the electronic medical record the patient received 5 defibrillations and given 300 mg amiodarone. Upon arrival the patient was found to have a STEMI and was taken to the Cath Lab where a DES was placed and a 100% LAD lesion. Post catheter patient was taken for therapeutic hypothermia at the intensive care unit. Per the initial critical care documentation there was concern for posturing. EKGs initially with a prolonged QT at 560, now improved.  Hospital course is significant for acute kidney injury, severe, anuric and hemodialysis dependent for now.  Etiology of the acute kidney injury is thought to be contrast induced nephropathy and hypotension.  Patient was initially on CRRT, been on on conventional hemodialysis.  Patient remains lethargic and encephalopathic.     Assessment & Plan:   Active Problems:   ACS (acute coronary syndrome) (HCC)   ST elevation myocardial infarction (STEMI) (HCC)   Ventricular fibrillation (HCC)   Cardiac arrest (HCC)   Shock (HCC)   Elevated LFTs   Acute renal failure (HCC)   History of ETT   Thrombocytopenia (HCC)   Substance abuse (HCC)   Cocaine abuse ([place])   Encephalopathy acute   AKI (acute kidney injury) ([place])   Ischemic cardiomyopathy   Status post cardiac (V. fib) arrest/STEMI status post PCI: Cardiology team is managing. We will defer management to the cardiology team.  Acute kidney  injury: This is likely secondary to hypotension and contrast-induced nephropathy. Renal prognosis is guarded.  Hypertension: Continue to optimize. Further management depend on hospital course.  Encephalopathy following V. fib arrest: Prognosis remains guarded. Neurology input is appreciated.  Hospitalist team will continue to follow patient with you.  DVT prophylaxis: SCD Code Status: Partial Family Communication: 2 sisters from [place] by the bedside Disposition Plan: Will depend on hospital course   Procedures:   Cardiac cath and PCI  Hemodialysis  Antimicrobials:   None   Subjective: Patient is unable to give any history.  Objective: Vitals:   06/02/18 0600 06/02/18 0625 06/02/18 1107 06/02/18 1556  BP: (!) 153/78 (!) 159/78 (!) 172/80 (!) 147/90  Pulse: (!) 107 96 99 88  Resp: 19 16 20 15   Temp: 98.4 F (36.9 C) 98.3 F (36.8 C) (!) 87.8 F (31 C) 98.1 F (36.7 C)  TempSrc: Oral Oral Oral Oral  SpO2: 99% 98% 96% 98%  Weight:      Height:        Intake/Output Summary (Last 24 hours) at 06/02/2018 1859 Last data filed at 06/02/2018 0800 Gross per 24 hour  Intake 860 ml  Output -  Net 860 ml   Filed Weights   06/01/18 1410 06/01/18 1630 06/02/18 0440  Weight: 72.2 kg (159 lb 2.8 oz) 70 kg (154 lb 5.2 oz) 66 kg (145 lb 8.1 oz)    Examination:  General exam: Remains encephalopathic  Respiratory system: Clear to auscultation.   Cardiovascular system: S1 & S2 heard, RRR.   Gastrointestinal system: Abdomen is nondistended, soft and nontender. No organomegaly or masses  felt. Normal bowel sounds heard. Central nervous system: Encephalopathic. Extremities: No leg edema.  Data Reviewed: I have personally reviewed following labs and imaging studies  CBC: Recent Labs  Lab 05/31/18 0409 06/01/18 0251 06/01/18 2231 06/02/18 0240 06/02/18 0951 06/02/18 1616  WBC 9.8 14.1* 13.7* 11.7* 12.5* 10.5  NEUTROABS 8.2* 12.3*  --  10.3* 10.9* 9.1*    HGB 8.9* 9.1* 7.6* 6.7* 7.8* 7.2*  HCT 28.3* 28.8* 24.0* 21.0* 23.6* 21.6*  MCV 87.1 86.0 86.6 85.7 87.1 86.1  PLT 132* 152 147* 144* 139* 387   Basic Metabolic Panel: Recent Labs  Lab 05/29/18 0333 05/30/18 0357 05/31/18 0409 05/31/18 1847 06/01/18 0251 06/02/18 0240 06/02/18 1616  NA 139 138 141 139 140 136 138  K 5.1 4.7 4.8 5.3* 5.5* 5.2* 5.7*  CL 104 101 105 103 103 98 101  CO2 27 26 24 22  20* 24 23  GLUCOSE 88 87 104* 104* 108* 98 84  BUN 35* 38* 77* 105* 119* 86* 104*  CREATININE 2.61* 2.74* 5.60* 7.20* 8.07* 7.10* 8.62*  CALCIUM 8.0* 8.2* 8.1* 8.2* 8.3* 7.8* 8.1*  MG 2.5* 2.7* 3.3*  --  3.6* 3.0*  --   PHOS 2.5 2.5 4.3  --  6.8* 6.3*  --    GFR: Estimated Creatinine Clearance: 9.5 mL/min (A) (by C-G formula based on SCr of 8.62 mg/dL (H)). Liver Function Tests: Recent Labs  Lab 05/29/18 0333 05/30/18 0357 05/31/18 0409 06/01/18 0251 06/02/18 0240  AST 263* 130* 71* 52* 45*  ALT 1,326* 577* 115* 26 9  ALKPHOS 226* 199* 172* 150* 138*  BILITOT 1.7* 1.4* 1.6* 1.2 1.2  PROT 5.4* 5.7* 5.6* 5.6* 5.4*  ALBUMIN 2.4* 2.4* 2.4* 2.4* 2.4*   No results for input(s): LIPASE, AMYLASE in the last 168 hours. No results for input(s): AMMONIA in the last 168 hours. Coagulation Profile: Recent Labs  Lab 05/31/18 1003 06/01/18 0251 06/01/18 2231  INR 1.10 1.14 1.18   Cardiac Enzymes: Recent Labs  Lab 05/28/18 2027 05/29/18 0333 05/29/18 1445  TROPONINI 0.92* 0.87* 0.66*   BNP (last 3 results) No results for input(s): PROBNP in the last 8760 hours. HbA1C: No results for input(s): HGBA1C in the last 72 hours. CBG: Recent Labs  Lab 06/01/18 0731 06/01/18 1119 06/01/18 1952 06/01/18 2351 06/02/18 0410  GLUCAP 93 123* 75 88 87   Lipid Profile: No results for input(s): CHOL, HDL, LDLCALC, TRIG, CHOLHDL, LDLDIRECT in the last 72 hours. Thyroid Function Tests: No results for input(s): TSH, T4TOTAL, FREET4, T3FREE, THYROIDAB in the last 72 hours. Anemia  Panel: No results for input(s): VITAMINB12, FOLATE, FERRITIN, TIBC, IRON, RETICCTPCT in the last 72 hours. Urine analysis:    Component Value Date/Time   COLORURINE YELLOW 05/22/2018 2024   APPEARANCEUR CLEAR 05/22/2018 2024   LABSPEC >1.046 (H) 05/22/2018 2024   PHURINE 5.0 05/22/2018 2024   GLUCOSEU NEGATIVE 05/22/2018 2024   HGBUR LARGE (A) 05/22/2018 2024   BILIRUBINUR NEGATIVE 05/22/2018 2024   KETONESUR NEGATIVE 05/22/2018 2024   PROTEINUR 30 (A) 05/22/2018 2024   NITRITE NEGATIVE 05/22/2018 2024   LEUKOCYTESUR NEGATIVE 05/22/2018 2024   Sepsis Labs: @LABRCNTIP (procalcitonin:4,lacticidven:4)  )No results found for this or any previous visit (from the past 240 hour(s)).       Radiology Studies: No results found.      Scheduled Meds: . sodium chloride   Intravenous Once  . aspirin  81 mg Oral Daily  . B-complex with vitamin C  1 tablet Per Tube Daily  .  Chlorhexidine Gluconate Cloth  6 each Topical Daily  . Chlorhexidine Gluconate Cloth  6 each Topical Q0600  . famotidine  20 mg Oral Daily  . feeding supplement (ENSURE ENLIVE)  237 mL Oral TID BM  . folic acid  1 mg Oral Daily  . gelatin adsorbable      . insulin aspart  0-9 Units Subcutaneous Q4H  . lidocaine-EPINEPHrine      . mouth rinse  15 mL Mouth Rinse BID  . metoprolol tartrate  25 mg Oral BID  . multivitamin with minerals  1 tablet Oral Daily  . QUEtiapine  25 mg Oral BID  . sodium chloride flush  3 mL Intravenous Q12H  . thiamine  100 mg Oral Daily  . THROMBI-PAD  3 each Topical Once  . ticagrelor  90 mg Per Tube BID  . zinc sulfate  220 mg Oral Daily   Continuous Infusions: . sodium chloride 500 mL (05/27/18 1647)  . sodium chloride    . sodium chloride       LOS: 11 days    Time spent: 36 minutes    Dana Allan, MD  [facility] Pager #: [phone] 7PM-7AM contact night coverage as above

## 2018-06-02 NOTE — Progress Notes (Signed)
CRITICAL VALUE ALERT  Critical Value:  Hgb 6.7  Date & Time Notied:  06/02/18 @approx . 0300  Provider Notified: Dr. Alva Garnet with CCM  Orders Received/Actions taken: Transfuse 1 PRBCs

## 2018-06-02 NOTE — Significant Event (Addendum)
Rapid Response Event Note  Overview:  Called by bedside RN regarding pt with significant bleeding from HD catheter.     Initial Focused Assessment: Upon arrival, pt with bleeding thru reinforced dressing as well as on bed pads under pt. Pt with no c/o pain or SOB. HR 96, BP 145/65, RR 19, spO2 97% on RA. Pt awake and oriented with family at bedside.   Interventions: Top of dressing changed with new abd's (bottom of dressing left in place d/t significant clots present).  Pressure held to catheter site Cardiology and IR paged by bedside RN prior to my arrival.  Placed on 2L [place] for comfort CCM notified CBC, PT/INR (hgb from 9.1->7.6, Md aware) CCM injected site with epi/lido and applied thrombi-pad Pressure held for an additional 5 mins  Plan of Care (if not transferred): Continue to monitor for signs of bleeding along with frequent vitals and monitor Hgb. Follow up with cardiology about Brilinta. Bedside RN instructed to call with any questions or concerns.   Event Summary:  called at  2157   event ended at  [place]

## 2018-06-02 NOTE — Progress Notes (Signed)
Nutrition Follow-up  DOCUMENTATION CODES:   Not applicable  INTERVENTION:   -Continue to recommend initiation of nutrition support, as pt's PO intake and mentation has not improved. Recommend:  Initiate Nepro @ 20 ml/hr and increase by 10 ml every 8 hours to goal rate of 40 ml/hr.   30 ml Prostat BID.    Tube feeding regimen provides 1928 kcal (100% of needs), 108 grams of protein, and 754 ml of H2O.   NUTRITION DIAGNOSIS:   Increased nutrient needs related to (AKI requiring IHD) as evidenced by estimated needs.  Ongoing  GOAL:   Patient will meet greater than or equal to 90% of their needs  Unmet  MONITOR:   Weight trends, Labs, Diet advancement, Vent status, TF tolerance, I & O's  REASON FOR ASSESSMENT:   Ventilator    ASSESSMENT:   Patient with PMH significant for CAD with prior MI, HTN, cocaine abuse, and myasthenia gravis s/p thymectomy. Presents this admission with after being found next to his mailbox unresponsive, by stander CPR was initiated. Admitted for cardiac arrest presumed in the setting of ST elevation MI and 100% LAD lesion.   8/2- extubated 8/2- s/p MBSS- advanced to dysphagia 1 diet with thin liquids 8/5- tunnelled HD cath placed   Reviewed I/O's: -1.6 L x 24 hours and +2.9 L since admission  Per SLP note on 06/01/18, no plans to advance diet at this time due to pt's poor attention.   Pt sleeping soundly at time of visit. Per chart review, pt had a difficult morning due to HD cath bleeding and agitation. RD opted not to enter the room to minimize distraction and confusion.   Case discussed with RN, who expressed concern over suboptimal intake. Noted meal completion 0-25%. Per RN, pt is very lethargic and takes very little PO's. RN shares pt could only take some of his medications crushed in applesauce this morning. RN reports she is discuss with MD and family about potential need for feeding tube to help pt meet his nutritional needs.   Labs  reviewed: CBGS: 87 (inpatient orders for glycemic control are 0-9 units insulin aspart every 4 hours).   Diet Order:   Diet Order           DIET - DYS 1 Room service appropriate? Yes; Fluid consistency: Thin  Diet effective now          EDUCATION NEEDS:   Not appropriate for education at this time  Skin:  Skin Assessment: Reviewed RN Assessment  Last BM:  06/01/18  Height:   Ht Readings from Last 1 Encounters:  05/29/18 5\' 10"  (1.778 m)    Weight:   Wt Readings from Last 1 Encounters:  06/02/18 145 lb 8.1 oz (66 kg)    Ideal Body Weight:  75.5 kg  BMI:  Body mass index is 20.88 kg/m.  Estimated Nutritional Needs:   Kcal:  1900-2100  Protein:  102-122 grams (1.5-1.8 g/kg using dry wt)  Fluid:  per MD    Yessika Otte A. Jimmye Norman, RD, LDN, CDE Pager: [phone] After hours Pager: [phone]

## 2018-06-02 NOTE — Progress Notes (Signed)
Admit: 05/22/2018 LOS: 73  18M ATN 2/2 CIN, cardiogenic shock; admit with cardiac arrest / STEMI to LAD req PCI; Off CRRT 05/30/18  Subjective:  . Overnight ongoing sig bleeding from [facility] exit site despite local pressure, threapies, ddAVP; appears to have finally stopped this AM . Hb dropped from this, to rec 2u PRBC . HD yesterday, cut short 2/2 bleeding, 2.3L UF. K 5.2 this AM . No UOP  08/06 0701 - 08/07 0700 In: 680 [P.O.:600; Blood:30; IV Piggyback:50] Out: 2300   Filed Weights   06/01/18 1410 06/01/18 1630 06/02/18 0440  Weight: 72.2 kg (159 lb 2.8 oz) 70 kg (154 lb 5.2 oz) 66 kg (145 lb 8.1 oz)    Scheduled Meds: . gelatin adsorbable      . lidocaine-EPINEPHrine      . sodium chloride   Intravenous Once  . aspirin  81 mg Oral Daily  . B-complex with vitamin C  1 tablet Per Tube Daily  . Chlorhexidine Gluconate Cloth  6 each Topical Daily  . Chlorhexidine Gluconate Cloth  6 each Topical Q0600  . famotidine  20 mg Oral Daily  . feeding supplement (ENSURE ENLIVE)  237 mL Oral TID BM  . folic acid  1 mg Oral Daily  . insulin aspart  0-9 Units Subcutaneous Q4H  . mouth rinse  15 mL Mouth Rinse BID  . metoprolol tartrate  25 mg Oral BID  . multivitamin with minerals  1 tablet Oral Daily  . QUEtiapine  25 mg Oral BID  . sodium chloride flush  3 mL Intravenous Q12H  . thiamine  100 mg Oral Daily  . THROMBI-PAD  3 each Topical Once  . ticagrelor  90 mg Per Tube BID  . zinc sulfate  220 mg Oral Daily   Continuous Infusions: . sodium chloride 500 mL (05/27/18 1647)  . sodium chloride    . sodium chloride     PRN Meds:.sodium chloride, sodium chloride, sodium chloride, alteplase, docusate, hydrALAZINE, lidocaine (PF), lidocaine-prilocaine, LORazepam, nitroGLYCERIN, pentafluoroprop-tetrafluoroeth, sodium chloride flush  Current Labs: reviewed   Physical Exam:  Blood pressure (!) 159/78, pulse 96, temperature 98.3 F (36.8 C), temperature source Oral, resp. rate 16, height 5'  10" (1.778 m), weight 66 kg (145 lb 8.1 oz), SpO2 98 %. NAD Confused RRR CTAB No sig LEE  A 1. Anuric AKI 2/2 ATN from CIN and Cardiogenic Shock, Off CRRT 05/30/18 2. Profuse bleeding from [facility] exit site; finally appears resolved 3. STEMI at LAD with PCI on DAPT 4. Cardiogenic Shock 2/2 #2, resolved off pressors / inotropes 5. Anemia with ABLA req transfusion from #2; start ESA 6. TCP, improving; HIT neg 7. VDRF, extubated on RA 8. AMS     P . HD this PM or tomorrow; check labs 1400 . Start Aranesp with next HD . Daily weights, Daily Renal Panel, Strict I/Os, Avoid nephrotoxins (NSAIDs, judicious IV Contrast)    Pearson Grippe MD 06/02/2018, 8:12 AM  Recent Labs  Lab 05/31/18 0409 05/31/18 1847 06/01/18 0251 06/02/18 0240  NA 141 139 140 136  K 4.8 5.3* 5.5* 5.2*  CL 105 103 103 98  CO2 24 22 20* 24  GLUCOSE 104* 104* 108* 98  BUN 77* 105* 119* 86*  CREATININE 5.60* 7.20* 8.07* 7.10*  CALCIUM 8.1* 8.2* 8.3* 7.8*  PHOS 4.3  --  6.8* 6.3*   Recent Labs  Lab 05/31/18 0409 06/01/18 0251 06/01/18 2231 06/02/18 0240  WBC 9.8 14.1* 13.7* 11.7*  NEUTROABS 8.2* 12.3*  --  10.3*  HGB 8.9* 9.1* 7.6* 6.7*  HCT 28.3* 28.8* 24.0* 21.0*  MCV 87.1 86.0 86.6 85.7  PLT 132* 152 147* 144*

## 2018-06-02 NOTE — Progress Notes (Signed)
No further bleeding at HD site. Pt restful throughout day, restraints d/c . Pt does not eat. Essential pills crushed in pudding and given with great difficulty. No fluid intake today. Family have questions regarding peg to meet nutritional needs.

## 2018-06-02 NOTE — Progress Notes (Signed)
At shift change EKG Tech called RN to room 2/2 "severe bleeding" at HD Site. Pt immediately assessed and found to have BRB oozing out of HD dressing onto abdomen. Pressure applied and RR RN called, IR PA paged, CCM called, and Attending (Cardiology) paged. IR and CCM to bedside and hemostasis achieved by IR PA. Pt more agitated and additional Ativan given. BP elevated and PRN anti-hypertensive given. Report given to Day RN and pt settled.

## 2018-06-02 NOTE — Significant Event (Addendum)
06/02/2018 0725 hours Called to bedside for critical care pager for continual bleeding from left IJ tunneled hemodialysis catheter. Dressing brought down continue oozing noted from the insertion site.  Clean with chlorhexidine.  Thrombin patch applied. PA from interventional radiology Lennette Bihari showed up and took over at that point.   Richardson Landry Katherina Wimer ACNP Maryanna Shape PCCM Pager [phone] till 1 pm If no answer page [phone] 06/02/2018, 8:44 AM

## 2018-06-03 LAB — CBC WITH DIFFERENTIAL/PLATELET
BASOS ABS: 0 10*3/uL (ref 0.0–0.1)
BASOS ABS: 0 10*3/uL (ref 0.0–0.1)
Basophils Relative: 0 %
Basophils Relative: 0 %
EOS ABS: 0 10*3/uL (ref 0.0–0.7)
Eosinophils Absolute: 0.1 10*3/uL (ref 0.0–0.7)
Eosinophils Relative: 0 %
Eosinophils Relative: 1 %
HCT: 22.6 % — ABNORMAL LOW (ref 39.0–52.0)
HEMATOCRIT: 23.3 % — AB (ref 39.0–52.0)
HEMOGLOBIN: 7.4 g/dL — AB (ref 13.0–17.0)
HEMOGLOBIN: 7.6 g/dL — AB (ref 13.0–17.0)
LYMPHS ABS: 0.6 10*3/uL — AB (ref 0.7–4.0)
LYMPHS PCT: 4 %
Lymphocytes Relative: 6 %
Lymphs Abs: 0.5 10*3/uL — ABNORMAL LOW (ref 0.7–4.0)
MCH: 27.9 pg (ref 26.0–34.0)
MCH: 27.8 pg (ref 26.0–34.0)
MCHC: 32.7 g/dL (ref 30.0–36.0)
MCHC: 32.6 g/dL (ref 30.0–36.0)
MCV: 85.3 fL (ref 78.0–100.0)
MCV: 85.3 fL (ref 78.0–100.0)
MONO ABS: 0.9 10*3/uL (ref 0.1–1.0)
MONOS PCT: 7 %
MONOS PCT: 9 %
Monocytes Absolute: 0.8 10*3/uL (ref 0.1–1.0)
NEUTROS PCT: 89 %
Neutro Abs: 10.6 10*3/uL — ABNORMAL HIGH (ref 1.7–7.7)
Neutro Abs: 8.5 10*3/uL — ABNORMAL HIGH (ref 1.7–7.7)
Neutrophils Relative %: 84 %
Platelets: 138 10*3/uL — ABNORMAL LOW (ref 150–400)
Platelets: 165 10*3/uL (ref 150–400)
RBC: 2.65 MIL/uL — AB (ref 4.22–5.81)
RBC: 2.73 MIL/uL — AB (ref 4.22–5.81)
RDW: 21.4 % — ABNORMAL HIGH (ref 11.5–15.5)
RDW: 21.2 % — ABNORMAL HIGH (ref 11.5–15.5)
WBC: 11.9 10*3/uL — AB (ref 4.0–10.5)
WBC: 10.1 10*3/uL (ref 4.0–10.5)

## 2018-06-03 LAB — IRON AND TIBC
Iron: 55 ug/dL (ref 45–182)
Saturation Ratios: 15 % — ABNORMAL LOW (ref 17.9–39.5)
TIBC: 361 ug/dL (ref 250–450)
UIBC: 306 ug/dL

## 2018-06-03 LAB — GLUCOSE, CAPILLARY
GLUCOSE-CAPILLARY: 79 mg/dL (ref 70–99)
GLUCOSE-CAPILLARY: 89 mg/dL (ref 70–99)
GLUCOSE-CAPILLARY: 79 mg/dL (ref 70–99)
GLUCOSE-CAPILLARY: 85 mg/dL (ref 70–99)
Glucose-Capillary: 66 mg/dL — ABNORMAL LOW (ref 70–99)
Glucose-Capillary: 76 mg/dL (ref 70–99)
Glucose-Capillary: 84 mg/dL (ref 70–99)

## 2018-06-03 LAB — BASIC METABOLIC PANEL
Anion gap: 16 — ABNORMAL HIGH (ref 5–15)
BUN: 122 mg/dL — ABNORMAL HIGH (ref 6–20)
CO2: 22 mmol/L (ref 22–32)
Calcium: 8.1 mg/dL — ABNORMAL LOW (ref 8.9–10.3)
Chloride: 99 mmol/L (ref 98–111)
Creatinine, Ser: 10.44 mg/dL — ABNORMAL HIGH (ref 0.61–1.24)
GFR calc Af Amer: 6 mL/min — ABNORMAL LOW (ref >60)
GFR calc non Af Amer: 5 mL/min — ABNORMAL LOW (ref >60)
Glucose, Bld: 118 mg/dL — ABNORMAL HIGH (ref 70–99)
Potassium: 6.3 mmol/L (ref 3.5–5.1)
Sodium: 137 mmol/L (ref 135–145)

## 2018-06-03 LAB — FERRITIN: FERRITIN: 121 ng/mL (ref 24–336)

## 2018-06-03 LAB — HEPATIC FUNCTION PANEL
ALT: 6 U/L (ref 0–44)
AST: 37 U/L (ref 15–41)
Albumin: 2.5 g/dL — ABNORMAL LOW (ref 3.5–5.0)
Alkaline Phosphatase: 126 U/L (ref 38–126)
Bilirubin, Direct: 0.3 mg/dL — ABNORMAL HIGH (ref 0.0–0.2)
Indirect Bilirubin: 0.9 mg/dL (ref 0.3–0.9)
Total Bilirubin: 1.2 mg/dL (ref 0.3–1.2)
Total Protein: 5.5 g/dL — ABNORMAL LOW (ref 6.5–8.1)

## 2018-06-03 LAB — PREPARE RBC (CROSSMATCH)

## 2018-06-03 LAB — PHOSPHORUS: Phosphorus: 9.5 mg/dL — ABNORMAL HIGH (ref 2.5–4.6)

## 2018-06-03 LAB — MAGNESIUM: MAGNESIUM: 3.7 mg/dL — AB (ref 1.7–2.4)

## 2018-06-03 MED ORDER — DARBEPOETIN ALFA 100 MCG/0.5ML IJ SOSY
100.0000 ug | PREFILLED_SYRINGE | INTRAMUSCULAR | Status: DC
  Administered 2018-06-03: 100 ug via INTRAVENOUS

## 2018-06-03 MED ORDER — ROSUVASTATIN CALCIUM 20 MG PO TABS: 20.0000 mg | ORAL_TABLET | Freq: Every day | ORAL | Status: DC

## 2018-06-03 MED ORDER — DARBEPOETIN ALFA 100 MCG/0.5ML IJ SOSY
PREFILLED_SYRINGE | INTRAMUSCULAR | Status: AC
  Filled 2018-06-03: qty 0.5

## 2018-06-03 MED ORDER — ROSUVASTATIN CALCIUM 20 MG PO TABS
10.0000 mg | ORAL_TABLET | Freq: Every day | ORAL | Status: DC
  Administered 2018-06-03 – 2018-06-07 (×4): 10 mg via ORAL
  Filled 2018-06-03 (×5): qty 1

## 2018-06-03 NOTE — Progress Notes (Signed)
Progress Note  Patient Name: Howard Thompson Date of Encounter: 06/03/2018  Primary Cardiologist: No primary care provider on file.  Subjective   Patient seen in dialysis. Much more alert today. No chest pain or SOB.   Inpatient Medications    Scheduled Meds: . sodium chloride   Intravenous Once  . aspirin  81 mg Oral Daily  . B-complex with vitamin C  1 tablet Per Tube Daily  . Chlorhexidine Gluconate Cloth  6 each Topical Daily  . Chlorhexidine Gluconate Cloth  6 each Topical Q0600  . darbepoetin (ARANESP) injection - DIALYSIS  100 mcg Intravenous Q Thu-HD  . famotidine  20 mg Oral Daily  . feeding supplement (ENSURE ENLIVE)  237 mL Oral TID BM  . folic acid  1 mg Oral Daily  . insulin aspart  0-9 Units Subcutaneous Q4H  . mouth rinse  15 mL Mouth Rinse BID  . metoprolol tartrate  25 mg Oral BID  . multivitamin with minerals  1 tablet Oral Daily  . QUEtiapine  25 mg Oral BID  . sodium chloride flush  3 mL Intravenous Q12H  . thiamine  100 mg Oral Daily  . THROMBI-PAD  3 each Topical Once  . ticagrelor  90 mg Per Tube BID  . zinc sulfate  220 mg Oral Daily   Continuous Infusions: . sodium chloride 500 mL (05/27/18 1647)  . sodium chloride    . sodium chloride     PRN Meds: sodium chloride, sodium chloride, sodium chloride, alteplase, docusate, Gerhardt's butt cream, hydrALAZINE, lidocaine (PF), lidocaine-prilocaine, LORazepam, nitroGLYCERIN, pentafluoroprop-tetrafluoroeth, sodium chloride flush   Vital Signs    Vitals:   06/03/18 0742 06/03/18 0800 06/03/18 0830 06/03/18 0900  BP: (!) 160/90 (!) 159/85 (!) 149/71 (!) 149/70  Pulse: 80 79 64 80  Resp: 17 16 17 18   Temp:      TempSrc:      SpO2:      Weight:      Height:        Intake/Output Summary (Last 24 hours) at 06/03/2018 2585 Last data filed at 06/03/2018 0430 Gross per 24 hour  Intake 540 ml  Output -  Net 540 ml   Filed Weights   06/02/18 0440 06/03/18 0300 06/03/18 0732  Weight: 66 kg 67.1 kg 65  kg    Telemetry    SR - Personally Reviewed  ECG    None today- Personally Reviewed  Physical Exam   General: confused, thin older W male appearing in no acute distress. Head: Normocephalic, atraumatic.  Neck: Supple, no JVD. Dialysis catheter in right anterior chest  Lungs:  Resp regular,  CTA. Heart: RRR, S1, S2, systolic murmur; no rub. HD temp cath site dressing dry.  Abdomen: Soft, non-tender, non-distended with normoactive bowel sounds.  Extremities: No clubbing, cyanosis, edema. Distal pedal pulses are 2+ bilaterally. Neuro: Alert oriented to self and place. Moves all extremities spontaneously.  Labs    Chemistry Recent Labs  Lab 06/01/18 0251 06/02/18 0240 06/02/18 1616 06/03/18 0759  NA 140 136 138 137  K 5.5* 5.2* 5.7* 6.3*  CL 103 98 101 99  CO2 20* 24 23 22   GLUCOSE 108* 98 84 118*  BUN 119* 86* 104* 122*  CREATININE 8.07* 7.10* 8.62* 10.44*  CALCIUM 8.3* 7.8* 8.1* 8.1*  PROT 5.6* 5.4*  --  5.5*  ALBUMIN 2.4* 2.4*  --  2.5*  AST 52* 45*  --  37  ALT 26 9  --  6  ALKPHOS  150* 138*  --  126  BILITOT 1.2 1.2  --  1.2  GFRNONAA 7* 8* 6* 5*  GFRAA 8* 9* 7* 6*  ANIONGAP 17* 14 14 16*     Hematology Recent Labs  Lab 06/02/18 1616 06/02/18 2142 06/03/18 0759  WBC 10.5 11.2* 11.9*  RBC 2.51* 2.42* 2.65*  HGB 7.2* 6.8* 7.4*  HCT 21.6* 20.8* 22.6*  MCV 86.1 86.0 85.3  MCH 28.7 28.1 27.9  MCHC 33.3 32.7 32.7  RDW 20.0* 20.1* 21.4*  PLT 151 141* 138*    Cardiac Enzymes Recent Labs  Lab 05/28/18 2027 05/29/18 0333 05/29/18 1445  TROPONINI 0.92* 0.87* 0.66*   No results for input(s): TROPIPOC in the last 168 hours.   BNPNo results for input(s): BNP, PROBNP in the last 168 hours.   DDimer No results for input(s): DDIMER in the last 168 hours.    Radiology    No results found.  Cardiac Studies        Post-Intervention Diagram          ------------------------------------------------------------------- ECHO Study  Conclusions  - Left ventricle: The cavity size was normal. Systolic function was severely reduced. The estimated ejection fraction was in the range of 25% to 30%. Severe diffuse hypokinesis with regional variations. There is akinesis of the apicalanterior and apical myocardium. There is akinesis of the midanteroseptal myocardium. There is severe hypokinesis of the inferoseptal myocardium. Features are consistent with a pseudonormal left ventricular filling pattern, with concomitant abnormal relaxation and increased filling pressure (grade 2 diastolic dysfunction). Doppler parameters are consistent with high ventricular filling pressure. - Aortic valve: A bicuspid morphology cannot be excluded; mildly thickened, mildly calcified leaflets. There was mild to moderate regurgitation. Regurgitation pressure half-time: 718 ms. - Mitral valve: There was mild to moderate regurgitation. Valve area by pressure half-time: 2.39 cm^2. - Right ventricle: Systolic function was moderately reduced. - Atrial septum: There was increased thickness of the septum, consistent with lipomatous hypertrophy. - Tricuspid valve: There was mild regurgitation. - Pulmonary arteries: PA peak pressure: 33 mm Hg (S). - Inferior vena cava: The vessel was dilated. The respirophasic diameter changes were blunted (<50%), consistent with elevated central venous pressure. - Pericardium, extracardiac: A small, free-flowing pericardial effusion was identified circumferential to the heart. The fluid had no internal echoes.There was no evidence of hemodynamic compromise.  Impressions:  - severe LV dysfunction with diffuse HK but apica/anteroseptal and apical anterior AK. Recommend definity contrast study to rule out apical thrombus.  Patient Profile     52 y.o. male out of hospital cardiac arrest, prior history of MI several years ago, myasthenia gravis post thymus resection,  anterior ST elevation post LAD stent. Extubated on 8/2.   Assessment & Plan    1. Cardiac arrest: Underwent cath with PCI/DES x1 to the pLAD on 05/22/18.  Plan for DAPT with ASA/Brilinta for 12 months. ASA was held in the setting of thrombocytopenia, but has since been resumed. Platelet count improved.  On metoprolol 25mg  BID. Statin held 2/2 to elevated LFTs. LFTs have normalized. Will initiate statin therapy.   2. Cardiogenic shock: Had been on both epi and levo, but now maintaining blood pressures without. Has been on BB with stable blood pressure.   3. ICM: EF noted at 25-30%. Switched IV BB to PO. No room for ARB/ACEi given AKF. No signs of volume overload. Consider switching to coreg given his low EF, but will wait until more stable. On dialysis now for volume management  4. Anoxic encephalopathy:  EEG was abnormal but repeat now improving. Not as alert/responsive today off Precedex - this was stopped due to bradycardia which has resolved. Is much more alert this am and agitation improved.    5. Acute renal failure: Nephrology following. S/p placement of new dialysis catheter right subclavian. Femoral catheter out. Has significant bleeding from HD cath before but this has resolved.  Dressing now dry this morning.   6. Polysubstance abuse: + for cocaine on admission.   7. Acute blood loss anemia 2/2 to bleeding HD cath: Hgb dropped to 6, now s/p 2 units of PRBCs. Hgb up to 7.8 as of 10am. Ideally would like Hgb above 8 given coronary issues. Will monitor Hgb with dialysis.   Signed, Peter Martinique, MD  06/03/2018, 9:23 AM    For questions or updates, please contact [facility] Please consult [email or web address] for contact info under Cardiology/STEMI.

## 2018-06-03 NOTE — Progress Notes (Signed)
OT Cancellation    06/03/18 0800  OT Visit Information  Last OT Received On 06/03/18  Reason Eval/Treat Not Completed Patient at procedure or test/ unavailable (HD. Will return as schedule allows.)   Kylin Genna MSOT, OTR/L Acute Rehab Pager: [phone] Office: [phone]

## 2018-06-03 NOTE — Progress Notes (Addendum)
Physical Therapy Treatment Patient Details Name: Howard Thompson MRN: [id number] DOB: 05/26/1966 Today's Date: 06/03/2018    History of Present Illness 52 y.o. male out of hospital cardiac arrest (05/22/18), Intubated 7/27- 8/2.  s/p IJ tunneled HD catheter placed 05/31/18 with bleeding from IJ site. Pt with anoxic encephalopathy.Marland Kitchen PMHx: myasthenia gravis post thymus resection, HTN, anterior STEMI post LAD stent.     PT Comments    Pt visiting with brother and friend with HOB 45 degrees upon PT arrival. Pt agreeable to therapy with some confusion and need for frequent reinforcement, verbal and tactile cueing throughout. Pt remains encephalopathic with inability to state age, although he can provide birthday. Pt with increased transfers, attention, and mobility this session. He remains limited by cognitive and balance deficits. Attempted to have patient don socks, but was unable. Family in room educated for reorientation of patient and progression. Will continue to follow.VSS throughout.       Follow Up Recommendations  CIR;Supervision/Assistance - 24 hour     Equipment Recommendations  Rolling walker with 5" wheels;3in1 (PT)    Recommendations for Other Services       Precautions / Restrictions Precautions Precautions: Fall Precaution Comments: suboccipital pressure wound Restrictions Weight Bearing Restrictions: No    Mobility  Bed Mobility Overal bed mobility: Needs Assistance Bed Mobility: Supine to Sit     Supine to sit: Mod assist     General bed mobility comments: pt limited by decreased trunk control with mod a to swing feet off and on bed, cues for initiation, sequence, and positioning of hands and legs   Transfers Overall transfer level: Needs assistance Equipment used: 2 person hand held assist Transfers: Sit to/from Stand Sit to Stand: Mod assist;+2 physical assistance         General transfer comment: bil knees blocked to achieve standing and stabilization, able  to decrease assist with time. cues for initiation, sequence, and positioning  Ambulation/Gait Ambulation/Gait assistance: Mod assist;+2 physical assistance(able to progress to min/mod +2) Gait Distance (Feet): 150 Feet Assistive device: 2 person hand held assist Gait Pattern/deviations: Ataxic;Scissoring;Narrow base of support Gait velocity: decreased  Gait velocity interpretation: <1.8 ft/sec, indicate of risk for recurrent falls General Gait Details: sporadic episodes of marching, ataxic and scissoring gait throughout.    Stairs             Wheelchair Mobility    Modified Rankin (Stroke Patients Only)       Balance Overall balance assessment: Needs assistance   Sitting balance-Leahy Scale: Poor Sitting balance - Comments: EOB grossly 5 min with min assist for balance and safety   Standing balance support: Bilateral upper extremity supported Standing balance-Leahy Scale: Poor Standing balance comment: bil Ue support in standing with assist for midline with varied right and left lean                            Cognition Arousal/Alertness: Awake/alert Behavior During Therapy: Restless Overall Cognitive Status: Impaired/Different from baseline Area of Impairment: Orientation;Awareness;Problem solving;Memory;Attention;Following commands;Safety/judgement                 Orientation Level: Disoriented to;Person;Situation Current Attention Level: Sustained Memory: Decreased recall of precautions;Decreased short-term memory Following Commands: Follows one step commands inconsistently Safety/Judgement: Decreased awareness of safety;Decreased awareness of deficits Awareness: Intellectual Problem Solving: Slow processing;Decreased initiation;Difficulty sequencing;Requires verbal cues;Requires tactile cues General Comments: Pt requires frequent cues for attention with increased processing times. inconsistently follows simple commands  Exercises       General Comments        Pertinent Vitals/Pain Pain Location: chest sore at compression site  Pain Descriptors / Indicators: Grimacing;Aching Pain Intervention(s): Repositioned;Monitored during session    Home Living                      Prior Function            PT Goals (current goals can now be found in the care plan section) Progress towards PT goals: Progressing toward goals    Frequency           PT Plan Current plan remains appropriate    Co-evaluation              AM-PAC PT "6 Clicks" Daily Activity  Outcome Measure  Difficulty turning over in bed (including adjusting bedclothes, sheets and blankets)?: Unable Difficulty moving from lying on back to sitting on the side of the bed? : Unable Difficulty sitting down on and standing up from a chair with arms (e.g., wheelchair, bedside commode, etc,.)?: Unable Help needed moving to and from a bed to chair (including a wheelchair)?: A Lot Help needed walking in hospital room?: A Lot Help needed climbing 3-5 steps with a railing? : Total 6 Click Score: 8    End of Session Equipment Utilized During Treatment: Gait belt Activity Tolerance: Patient tolerated treatment well Patient left: in chair;with call bell/phone within reach;with chair alarm set(chair alarm belt with pt able to demonstrate use and RN aware) Nurse Communication: Mobility status;Precautions PT Visit Diagnosis: Unsteadiness on feet (R26.81);Muscle weakness (generalized) (M62.81);Other abnormalities of gait and mobility (R26.89);Other symptoms and signs involving the nervous system (R29.898)     Time: 0355-9741 PT Time Calculation (min) (ACUTE ONLY): 33 min  Charges:  $Gait Training: 8-22 mins $Therapeutic Activity: 8-22 mins                     Samuella Bruin, [place]  Acute Rehab [phone]    Samuella Bruin 06/03/2018, 2:57 PM

## 2018-06-03 NOTE — Progress Notes (Signed)
OT Cancellation Note  Patient Details Name: Howard Thompson MRN: [id number] DOB: 06-25-1966   Cancelled Treatment:    Reason Eval/Treat Not Completed: Other (comment)(Second attempt. Pt starting physical therapy session. Will return as schedule allows. Of note, pt appears to be more alert today then date of OT evaluation.)  [place], OTR/L Acute Rehab Pager: [phone] Office: [phone] 06/03/2018, 2:03 PM

## 2018-06-03 NOTE — Progress Notes (Signed)
PT Cancellation Note  Patient Details Name: Howard Thompson MRN: [id number] DOB: [birth year]   Cancelled Treatment:    Reason Eval/Treat Not Completed: Patient at procedure or test/unavailable(HD)   Shadonna Benedick B Zebulen Simonis 06/03/2018, 9:13 AM  Elwyn Reach, [place]

## 2018-06-03 NOTE — Progress Notes (Signed)
Rehab Admissions Coordinator Note:  Per PT recommendation, Patient was screened by Jhonnie Garner for appropriateness for an Inpatient Acute Rehab Consult.  At this time, we are recommending Inpatient Rehab consult. AC will contact MD regarding request for IP Rehab Consult order.   Jhonnie Garner 06/03/2018, 6:10 PM  I can be reached at [phone].

## 2018-06-03 NOTE — Procedures (Signed)
I was present at this dialysis session. I have reviewed the session itself and made appropriate changes.   Pt is now awake and oriented ot self, location; much more cogent. TDC not bleeding.  Hb 7.4. RFP pending. 2K bath Qb 350 2L UF goal.  Start ESA today.  Chec Fe panel.  Tentative next HD 8/10.    Filed Weights   06/02/18 0440 06/03/18 0300 06/03/18 0732  Weight: 66 kg 67.1 kg 65 kg    Recent Labs  Lab 06/02/18 0240 06/02/18 1616  NA 136 138  K 5.2* 5.7*  CL 98 101  CO2 24 23  GLUCOSE 98 84  BUN 86* 104*  CREATININE 7.10* 8.62*  CALCIUM 7.8* 8.1*  PHOS 6.3*  --     Recent Labs  Lab 06/02/18 1616 06/02/18 2142 06/03/18 0759  WBC 10.5 11.2* 11.9*  NEUTROABS 9.1* 9.7* PENDING  HGB 7.2* 6.8* 7.4*  HCT 21.6* 20.8* 22.6*  MCV 86.1 86.0 85.3  PLT 151 141* 138*    Scheduled Meds: . sodium chloride   Intravenous Once  . aspirin  81 mg Oral Daily  . B-complex with vitamin C  1 tablet Per Tube Daily  . Chlorhexidine Gluconate Cloth  6 each Topical Daily  . Chlorhexidine Gluconate Cloth  6 each Topical Q0600  . famotidine  20 mg Oral Daily  . feeding supplement (ENSURE ENLIVE)  237 mL Oral TID BM  . folic acid  1 mg Oral Daily  . insulin aspart  0-9 Units Subcutaneous Q4H  . mouth rinse  15 mL Mouth Rinse BID  . metoprolol tartrate  25 mg Oral BID  . multivitamin with minerals  1 tablet Oral Daily  . QUEtiapine  25 mg Oral BID  . sodium chloride flush  3 mL Intravenous Q12H  . thiamine  100 mg Oral Daily  . THROMBI-PAD  3 each Topical Once  . ticagrelor  90 mg Per Tube BID  . zinc sulfate  220 mg Oral Daily   Continuous Infusions: . sodium chloride 500 mL (05/27/18 1647)  . sodium chloride    . sodium chloride     PRN Meds:.sodium chloride, sodium chloride, sodium chloride, alteplase, docusate, Gerhardt's butt cream, hydrALAZINE, lidocaine (PF), lidocaine-prilocaine, LORazepam, nitroGLYCERIN, pentafluoroprop-tetrafluoroeth, sodium chloride flush   Pearson Grippe   MD 06/03/2018, 8:59 AM

## 2018-06-03 NOTE — Progress Notes (Signed)
PROGRESS NOTE    Howard Thompson  RSW:[id number] DOB: 25-Sep-1966 DOA: 05/22/2018 PCP: No primary care provider on file.  Outpatient Specialists:     Brief Narrative:  Patient is a 52 year old Caucasian male, with past medical history significant for coronary artery disease, myasthenia gravis, hypertension, cocaine abuse and alcohol abuse.  Patient is also stated to be undergoing stressful.  Apparently, patient has recently separated from the wife.  Patient was admitted on7/27/2019s/p unwitnessed out of hospital cardiac arrest, VF, unknown downtime. Per the electronic medical record the patient received 5 defibrillations and given 300 mg amiodarone. Upon arrival the patient was found to have a STEMI and was taken to the Cath Lab where a DES was placed and a 100% LAD lesion. Post catheter patient was taken for therapeutic hypothermia at the intensive care unit. Per the initial critical care documentation there was concern for posturing. EKGs initially with a prolonged QT at 560, now improved.  Hospital course is significant for acute kidney injury, severe, anuric and hemodialysis dependent for now.  Etiology of the acute kidney injury is thought to be contrast induced nephropathy and hypotension.  Patient was initially on CRRT, been on on conventional hemodialysis.  Patient remains lethargic and encephalopathic.  06/03/2018: Patient underwent hemodialysis today.  Patient is more awake today and communicative.  Assessment & Plan:   Active Problems:   ACS (acute coronary syndrome) (HCC)   ST elevation myocardial infarction (STEMI) (HCC)   Ventricular fibrillation (HCC)   Cardiac arrest (HCC)   Shock ([place])   Elevated LFTs   Acute renal failure (HCC)   History of ETT   Thrombocytopenia (HCC)   Substance abuse (HCC)   Cocaine abuse ([place])   Encephalopathy acute   AKI (acute kidney injury) ([place])   Ischemic cardiomyopathy   Status post cardiac (V. fib) arrest/STEMI status post  PCI: Stable Cardiology team is managing. We will defer management to the cardiology team.  Acute kidney injury, severe, anuric, hemodialysis dependent for now: This is likely secondary to hypotension and contrast-induced nephropathy. Renal prognosis is guarded. Continue hemodialysis.    Hypertension: Continue to optimize. Further management depend on hospital course.  Encephalopathy following V. fib arrest: Prognosis remains guarded. Neurology input is appreciated. Severe renal impairment, with associated uremia may be affecting patient's mental status as well.  Hospitalist team will continue to follow patient with you.  DVT prophylaxis: SCD Code Status: Partial Family Communication: 2 sisters from [place] by the bedside Disposition Plan: Will depend on hospital course   Procedures:   Cardiac cath and PCI  Hemodialysis  Antimicrobials:   None   Subjective: Patient looks better today.   More communicative, but still cannot give any coherent history.    Objective: Vitals:   06/03/18 1130 06/03/18 1142 06/03/18 1228 06/03/18 1300  BP: (!) 143/60 (!) 160/60    Pulse: 72 72 64 71  Resp: 18 18  16   Temp:  98 F (36.7 C)    TempSrc:  Oral    SpO2:   100% 96%  Weight:   63.2 kg   Height:        Intake/Output Summary (Last 24 hours) at 06/03/2018 1834 Last data filed at 06/03/2018 1230 Gross per 24 hour  Intake 543 ml  Output 2000 ml  Net -1457 ml   Filed Weights   06/03/18 0300 06/03/18 0732 06/03/18 1228  Weight: 67.1 kg 65 kg 63.2 kg    Examination:  General exam: Remains encephalopathic, but looks better today. Respiratory system:  Clear to auscultation.   Cardiovascular system: S1 & S2 heard, RRR.   Gastrointestinal system: Abdomen is nondistended, soft and nontender. No organomegaly or masses felt. Normal bowel sounds heard. Central nervous system: Encephalopathic. Extremities: No leg edema.  Data Reviewed: I have personally reviewed  following labs and imaging studies  CBC: Recent Labs  Lab 06/02/18 0240 06/02/18 0951 06/02/18 1616 06/02/18 2142 06/03/18 0759  WBC 11.7* 12.5* 10.5 11.2* 11.9*  NEUTROABS 10.3* 10.9* 9.1* 9.7* 10.6*  HGB 6.7* 7.8* 7.2* 6.8* 7.4*  HCT 21.0* 23.6* 21.6* 20.8* 22.6*  MCV 85.7 87.1 86.1 86.0 85.3  PLT 144* 139* 151 141* 397*   Basic Metabolic Panel: Recent Labs  Lab 05/30/18 0357 05/31/18 0409 05/31/18 1847 06/01/18 0251 06/02/18 0240 06/02/18 1616 06/03/18 0759  NA 138 141 139 140 136 138 137  K 4.7 4.8 5.3* 5.5* 5.2* 5.7* 6.3*  CL 101 105 103 103 98 101 99  CO2 26 24 22  20* 24 23 22   GLUCOSE 87 104* 104* 108* 98 84 118*  BUN 38* 77* 105* 119* 86* 104* 122*  CREATININE 2.74* 5.60* 7.20* 8.07* 7.10* 8.62* 10.44*  CALCIUM 8.2* 8.1* 8.2* 8.3* 7.8* 8.1* 8.1*  MG 2.7* 3.3*  --  3.6* 3.0*  --  3.7*  PHOS 2.5 4.3  --  6.8* 6.3*  --  9.5*   GFR: Estimated Creatinine Clearance: 7.5 mL/min (A) (by C-G formula based on SCr of 10.44 mg/dL (H)). Liver Function Tests: Recent Labs  Lab 05/30/18 0357 05/31/18 0409 06/01/18 0251 06/02/18 0240 06/03/18 0759  AST 130* 71* 52* 45* 37  ALT 577* 115* 26 9 6   ALKPHOS 199* 172* 150* 138* 126  BILITOT 1.4* 1.6* 1.2 1.2 1.2  PROT 5.7* 5.6* 5.6* 5.4* 5.5*  ALBUMIN 2.4* 2.4* 2.4* 2.4* 2.5*   No results for input(s): LIPASE, AMYLASE in the last 168 hours. No results for input(s): AMMONIA in the last 168 hours. Coagulation Profile: Recent Labs  Lab 05/31/18 1003 06/01/18 0251 06/01/18 2231  INR 1.10 1.14 1.18   Cardiac Enzymes: Recent Labs  Lab 05/28/18 2027 05/29/18 0333 05/29/18 1445  TROPONINI 0.92* 0.87* 0.66*   BNP (last 3 results) No results for input(s): PROBNP in the last 8760 hours. HbA1C: No results for input(s): HGBA1C in the last 72 hours. CBG: Recent Labs  Lab 06/03/18 0006 06/03/18 0456 06/03/18 1222 06/03/18 1317 06/03/18 1657  GLUCAP 76 79 66* 89 79   Lipid Profile: No results for input(s):  CHOL, HDL, LDLCALC, TRIG, CHOLHDL, LDLDIRECT in the last 72 hours. Thyroid Function Tests: No results for input(s): TSH, T4TOTAL, FREET4, T3FREE, THYROIDAB in the last 72 hours. Anemia Panel: Recent Labs    06/03/18 1331  FERRITIN 121  TIBC 361  IRON 55   Urine analysis:    Component Value Date/Time   COLORURINE YELLOW 05/22/2018 2024   APPEARANCEUR CLEAR 05/22/2018 2024   LABSPEC >1.046 (H) 05/22/2018 2024   PHURINE 5.0 05/22/2018 2024   GLUCOSEU NEGATIVE 05/22/2018 2024   HGBUR LARGE (A) 05/22/2018 2024   BILIRUBINUR NEGATIVE 05/22/2018 2024   KETONESUR NEGATIVE 05/22/2018 2024   PROTEINUR 30 (A) 05/22/2018 2024   NITRITE NEGATIVE 05/22/2018 2024   LEUKOCYTESUR NEGATIVE 05/22/2018 2024   Sepsis Labs: @LABRCNTIP (procalcitonin:4,lacticidven:4)  )No results found for this or any previous visit (from the past 240 hour(s)).       Radiology Studies: No results found.      Scheduled Meds: . sodium chloride   Intravenous Once  . aspirin  81 mg Oral Daily  . B-complex with vitamin C  1 tablet Per Tube Daily  . Chlorhexidine Gluconate Cloth  6 each Topical Daily  . Chlorhexidine Gluconate Cloth  6 each Topical Q0600  . darbepoetin (ARANESP) injection - DIALYSIS  100 mcg Intravenous Q Thu-HD  . famotidine  20 mg Oral Daily  . feeding supplement (ENSURE ENLIVE)  237 mL Oral TID BM  . folic acid  1 mg Oral Daily  . insulin aspart  0-9 Units Subcutaneous Q4H  . mouth rinse  15 mL Mouth Rinse BID  . metoprolol tartrate  25 mg Oral BID  . multivitamin with minerals  1 tablet Oral Daily  . QUEtiapine  25 mg Oral BID  . rosuvastatin  10 mg Oral q1800  . sodium chloride flush  3 mL Intravenous Q12H  . thiamine  100 mg Oral Daily  . THROMBI-PAD  3 each Topical Once  . ticagrelor  90 mg Per Tube BID  . zinc sulfate  220 mg Oral Daily   Continuous Infusions: . sodium chloride 500 mL (05/27/18 1647)     LOS: 12 days    Time spent: 35 minutes    Dana Allan,  MD  [facility] Pager #: [phone] 7PM-7AM contact night coverage as above

## 2018-06-03 NOTE — Progress Notes (Signed)
SLP Cancellation Note  Patient Details Name: Howard Thompson MRN: [id number] DOB: 02-Oct-1966   Cancelled treatment:       Reason Eval/Treat Not Completed: Patient at procedure or test/unavailable Agree with discussion regarding alternate means of nutrition if there is no expectation that pts mentation will improve in the short term. May consider Cortrak if pts attention expected to improve with treatment.    Blessed Cotham, Katherene Ponto 06/03/2018, 8:42 AM

## 2018-06-04 ENCOUNTER — Inpatient Hospital Stay (HOSPITAL_COMMUNITY): Payer: No Typology Code available for payment source

## 2018-06-04 DIAGNOSIS — J9600 Acute respiratory failure, unspecified whether with hypoxia or hypercapnia: Secondary | ICD-10-CM

## 2018-06-04 LAB — RENAL FUNCTION PANEL
Albumin: 2.7 g/dL — ABNORMAL LOW (ref 3.5–5.0)
Anion gap: 16 — ABNORMAL HIGH (ref 5–15)
BUN: 66 mg/dL — ABNORMAL HIGH (ref 6–20)
CO2: 26 mmol/L (ref 22–32)
Calcium: 8.5 mg/dL — ABNORMAL LOW (ref 8.9–10.3)
Chloride: 96 mmol/L — ABNORMAL LOW (ref 98–111)
Creatinine, Ser: 7.59 mg/dL — ABNORMAL HIGH (ref 0.61–1.24)
GFR calc Af Amer: 9 mL/min — ABNORMAL LOW (ref >60)
GFR calc non Af Amer: 7 mL/min — ABNORMAL LOW (ref >60)
Glucose, Bld: 90 mg/dL (ref 70–99)
Phosphorus: 7.5 mg/dL — ABNORMAL HIGH (ref 2.5–4.6)
Potassium: 6 mmol/L — ABNORMAL HIGH (ref 3.5–5.1)
Sodium: 138 mmol/L (ref 135–145)

## 2018-06-04 LAB — BPAM RBC
BLOOD PRODUCT EXPIRATION DATE: 201908212359
Blood Product Expiration Date: 201909012359
ISSUE DATE / TIME: 201908070605
ISSUE DATE / TIME: 201908080028
UNIT TYPE AND RH: 5100
Unit Type and Rh: 5100

## 2018-06-04 LAB — CBC WITH DIFFERENTIAL/PLATELET
BASOS PCT: 0 %
BASOS PCT: 0 %
Basophils Absolute: 0 10*3/uL (ref 0.0–0.1)
Basophils Absolute: 0 10*3/uL (ref 0.0–0.1)
EOS PCT: 0 %
Eosinophils Absolute: 0 10*3/uL (ref 0.0–0.7)
Eosinophils Absolute: 0 10*3/uL (ref 0.0–0.7)
Eosinophils Relative: 0 %
HCT: 25 % — ABNORMAL LOW (ref 39.0–52.0)
HEMATOCRIT: 24.7 % — AB (ref 39.0–52.0)
HEMOGLOBIN: 7.9 g/dL — AB (ref 13.0–17.0)
HEMOGLOBIN: 8 g/dL — AB (ref 13.0–17.0)
LYMPHS ABS: 0.6 10*3/uL — AB (ref 0.7–4.0)
LYMPHS PCT: 5 %
Lymphocytes Relative: 3 %
Lymphs Abs: 0.4 10*3/uL — ABNORMAL LOW (ref 0.7–4.0)
MCH: 27.5 pg (ref 26.0–34.0)
MCH: 27.9 pg (ref 26.0–34.0)
MCHC: 31.6 g/dL (ref 30.0–36.0)
MCHC: 32.4 g/dL (ref 30.0–36.0)
MCV: 87.1 fL (ref 78.0–100.0)
MCV: 86.1 fL (ref 78.0–100.0)
MONOS PCT: 9 %
MONOS PCT: 8 %
Monocytes Absolute: 1.1 10*3/uL — ABNORMAL HIGH (ref 0.1–1.0)
Monocytes Absolute: 1.1 10*3/uL — ABNORMAL HIGH (ref 0.1–1.0)
NEUTROS ABS: 10.9 10*3/uL — AB (ref 1.7–7.7)
NEUTROS PCT: 89 %
Neutro Abs: 12 10*3/uL — ABNORMAL HIGH (ref 1.7–7.7)
Neutrophils Relative %: 86 %
Platelets: 176 10*3/uL (ref 150–400)
Platelets: 209 10*3/uL (ref 150–400)
RBC: 2.87 MIL/uL — ABNORMAL LOW (ref 4.22–5.81)
RBC: 2.87 MIL/uL — AB (ref 4.22–5.81)
RDW: 21.6 % — AB (ref 11.5–15.5)
RDW: 21.6 % — ABNORMAL HIGH (ref 11.5–15.5)
WBC: 12.6 10*3/uL — ABNORMAL HIGH (ref 4.0–10.5)
WBC: 13.5 10*3/uL — AB (ref 4.0–10.5)

## 2018-06-04 LAB — GLUCOSE, CAPILLARY
GLUCOSE-CAPILLARY: 103 mg/dL — AB (ref 70–99)
Glucose-Capillary: 70 mg/dL (ref 70–99)
Glucose-Capillary: 83 mg/dL (ref 70–99)
Glucose-Capillary: 93 mg/dL (ref 70–99)
Glucose-Capillary: 78 mg/dL (ref 70–99)

## 2018-06-04 LAB — TYPE AND SCREEN
ABO/RH(D): O POS
ANTIBODY SCREEN: NEGATIVE
UNIT DIVISION: 0
UNIT DIVISION: 0

## 2018-06-04 LAB — HEPATIC FUNCTION PANEL
ALK PHOS: 134 U/L — AB (ref 38–126)
ALT: 7 U/L (ref 0–44)
AST: 40 U/L (ref 15–41)
Albumin: 2.6 g/dL — ABNORMAL LOW (ref 3.5–5.0)
BILIRUBIN DIRECT: 0.4 mg/dL — AB (ref 0.0–0.2)
BILIRUBIN INDIRECT: 0.9 mg/dL (ref 0.3–0.9)
Total Bilirubin: 1.3 mg/dL — ABNORMAL HIGH (ref 0.3–1.2)
Total Protein: 5.9 g/dL — ABNORMAL LOW (ref 6.5–8.1)

## 2018-06-04 LAB — BLOOD GAS, ARTERIAL
Acid-Base Excess: 4 mmol/L — ABNORMAL HIGH (ref 0.0–2.0)
BICARBONATE: 27 mmol/L (ref 20.0–28.0)
DRAWN BY: 406621
O2 Content: 6 L/min
O2 Saturation: 95.1 %
Patient temperature: 98.6
pCO2 arterial: 33.7 mmHg (ref 32.0–48.0)
pH, Arterial: 7.515 — ABNORMAL HIGH (ref 7.350–7.450)
pO2, Arterial: 71.6 mmHg — ABNORMAL LOW (ref 83.0–108.0)

## 2018-06-04 LAB — COMPREHENSIVE METABOLIC PANEL
ALBUMIN: 2.6 g/dL — AB (ref 3.5–5.0)
ALT: 7 U/L (ref 0–44)
AST: 36 U/L (ref 15–41)
Alkaline Phosphatase: 137 U/L — ABNORMAL HIGH (ref 38–126)
Anion gap: 18 — ABNORMAL HIGH (ref 5–15)
BUN: 71 mg/dL — AB (ref 6–20)
CHLORIDE: 95 mmol/L — AB (ref 98–111)
CO2: 25 mmol/L (ref 22–32)
CREATININE: 8.11 mg/dL — AB (ref 0.61–1.24)
Calcium: 8.5 mg/dL — ABNORMAL LOW (ref 8.9–10.3)
GFR calc Af Amer: 8 mL/min — ABNORMAL LOW (ref >60)
GFR, EST NON AFRICAN AMERICAN: 7 mL/min — AB (ref >60)
GLUCOSE: 88 mg/dL (ref 70–99)
POTASSIUM: 6.2 mmol/L — AB (ref 3.5–5.1)
Sodium: 138 mmol/L (ref 135–145)
Total Bilirubin: 1.4 mg/dL — ABNORMAL HIGH (ref 0.3–1.2)
Total Protein: 6.1 g/dL — ABNORMAL LOW (ref 6.5–8.1)

## 2018-06-04 LAB — PROTIME-INR
INR: 1.23
Prothrombin Time: 15.4 seconds — ABNORMAL HIGH (ref 11.4–15.2)

## 2018-06-04 LAB — MAGNESIUM
Magnesium: 2.6 mg/dL — ABNORMAL HIGH (ref 1.7–2.4)
Magnesium: 2.6 mg/dL — ABNORMAL HIGH (ref 1.7–2.4)

## 2018-06-04 LAB — PHOSPHORUS: PHOSPHORUS: 6.9 mg/dL — AB (ref 2.5–4.6)

## 2018-06-04 LAB — AMMONIA: AMMONIA: 22 umol/L (ref 9–35)

## 2018-06-04 LAB — PROCALCITONIN: PROCALCITONIN: 3 ng/mL

## 2018-06-04 LAB — APTT: APTT: 33 s (ref 24–36)

## 2018-06-04 LAB — LACTIC ACID, PLASMA: LACTIC ACID, VENOUS: 1 mmol/L (ref 0.5–1.9)

## 2018-06-04 MED ORDER — LORAZEPAM 0.5 MG PO TABS
0.5000 mg | ORAL_TABLET | Freq: Three times a day (TID) | ORAL | Status: AC
  Administered 2018-06-10 – 2018-06-13 (×9): 0.5 mg via ORAL
  Filled 2018-06-04 (×8): qty 1

## 2018-06-04 MED ORDER — LORAZEPAM 1 MG PO TABS
1.0000 mg | ORAL_TABLET | Freq: Three times a day (TID) | ORAL | Status: AC
  Administered 2018-06-07 – 2018-06-09 (×7): 1 mg via ORAL
  Filled 2018-06-04 (×8): qty 1

## 2018-06-04 MED ORDER — SODIUM ZIRCONIUM CYCLOSILICATE 10 G PO PACK
10.0000 g | PACK | Freq: Two times a day (BID) | ORAL | Status: AC
  Administered 2018-06-04 (×2): 10 g via ORAL
  Filled 2018-06-04 (×2): qty 1

## 2018-06-04 MED ORDER — ALBUTEROL SULFATE (2.5 MG/3ML) 0.083% IN NEBU: 2.5000 mg | INHALATION_SOLUTION | RESPIRATORY_TRACT | Status: DC | PRN

## 2018-06-04 MED ORDER — LORAZEPAM 1 MG PO TABS
2.0000 mg | ORAL_TABLET | Freq: Three times a day (TID) | ORAL | Status: AC
  Administered 2018-06-04 – 2018-06-05 (×3): 2 mg via ORAL
  Filled 2018-06-04 (×5): qty 2

## 2018-06-04 NOTE — Progress Notes (Signed)
Physical Therapy Treatment Patient Details Name: Howard Thompson MRN: [id number] DOB: [birth year] Today's Date: 06/04/2018    History of Present Illness 52 y.o. male out of hospital cardiac arrest (05/22/18), Intubated 7/27- 8/2.  s/p IJ tunneled HD catheter placed 05/31/18 with bleeding from IJ site. Pt with anoxic encephalopathy.Marland Kitchen PMHx: myasthenia gravis post thymus resection, HTN, anterior STEMI post LAD stent.     PT Comments    Pt with increased confusion and restless this session compared to 8/8. Pt with SpO2 82% on RA on arrival, removed and replaced pulse ox probe with accurate reading, applied 4L O2 with rise to 91 and required 6L to achieve 93-95% and maintain. HR 108-124 supine and EOB with BP reading 174/108 in sitting. RN called to room to notify pt of change in cardiopulmonary function and decline in mental status from prior date. Pt with decreased ability to answer questions and commands and limited mobility and session this date due to above. Dr.Coldonato also present end of session and made aware.       Follow Up Recommendations  CIR;Supervision/Assistance - 24 hour     Equipment Recommendations  Rolling walker with 5" wheels;3in1 (PT)    Recommendations for Other Services       Precautions / Restrictions Precautions Precautions: Fall Precaution Comments: suboccipital pressure wound, watch SpO2    Mobility  Bed Mobility Overal bed mobility: Needs Assistance Bed Mobility: Supine to Sit;Sit to Supine     Supine to sit: Mod assist Sit to supine: Mod assist   General bed mobility comments: multimodal cues with assist to initiate movement to and from supine with HOB elevated 30 degrees to rise from surface. pt able to sit EOB grossly 8 min with minguard-min assist for safety  Transfers                 General transfer comment: did not attempt this session due to cardiopulmonary status  Ambulation/Gait                 Stairs              Wheelchair Mobility    Modified Rankin (Stroke Patients Only)       Balance Overall balance assessment: Needs assistance Sitting-balance support: Feet supported;No upper extremity supported Sitting balance-Leahy Scale: Fair Sitting balance - Comments: 8 min with minguard-min assist varied with tendency for trunk flexion propping forearm on knees                                    Cognition Arousal/Alertness: Lethargic Behavior During Therapy: Restless Overall Cognitive Status: Impaired/Different from baseline Area of Impairment: Orientation;Awareness;Problem solving;Memory;Attention;Following commands;Safety/judgement                 Orientation Level: Disoriented to;Person;Situation;Time Current Attention Level: Focused Memory: Decreased recall of precautions;Decreased short-term memory Following Commands: Follows one step commands inconsistently Safety/Judgement: Decreased awareness of safety;Decreased awareness of deficits Awareness: Intellectual Problem Solving: Slow processing;Decreased initiation;Difficulty sequencing;Requires verbal cues;Requires tactile cues General Comments: pt with decreased attention, awareness and ability to follow commands from last session. Pt stating awareness of wife in room and that he is in hospital, unable to state name or birthdate       Exercises      General Comments        Pertinent Vitals/Pain Pain Location: pt reports sore chest and abdomen with pressure Pain Descriptors / Indicators: Grimacing Pain Intervention(s): Limited  activity within patient's tolerance;Repositioned    Home Living                      Prior Function            PT Goals (current goals can now be found in the care plan section) Progress towards PT goals: Not progressing toward goals - comment(due to change in cardiopulmonary function)    Frequency           PT Plan Current plan remains appropriate     Co-evaluation              AM-PAC PT "6 Clicks" Daily Activity  Outcome Measure  Difficulty turning over in bed (including adjusting bedclothes, sheets and blankets)?: Unable Difficulty moving from lying on back to sitting on the side of the bed? : Unable Difficulty sitting down on and standing up from a chair with arms (e.g., wheelchair, bedside commode, etc,.)?: Unable Help needed moving to and from a bed to chair (including a wheelchair)?: A Lot Help needed walking in hospital room?: A Lot Help needed climbing 3-5 steps with a railing? : Total 6 Click Score: 8    End of Session   Activity Tolerance: Treatment limited secondary to medical complications (Comment) Patient left: in bed;with bed alarm set;with nursing/sitter in room;with family/visitor present;with call bell/phone within reach Nurse Communication: Mobility status;Precautions PT Visit Diagnosis: Unsteadiness on feet (R26.81);Muscle weakness (generalized) (M62.81);Other abnormalities of gait and mobility (R26.89);Other symptoms and signs involving the nervous system (T03.546)     Time: 5681-2751 PT Time Calculation (min) (ACUTE ONLY): 24 min  Charges:  $Therapeutic Activity: 23-37 mins                     Elwyn Reach, [place]    Sandy Salaam Borden Thune 06/04/2018, 9:25 AM

## 2018-06-04 NOTE — Progress Notes (Signed)
Patient taken by transporter, via hospital bed, to hemodialysis.

## 2018-06-04 NOTE — Progress Notes (Signed)
Progress Note  Patient Name: Howard Thompson Date of Encounter: 06/04/2018  Primary Cardiologist: No primary care provider on file.  Subjective   Patient with increased agitation, dyspnea, and hypoxemia during the night. Some chest pain. More confused.   Inpatient Medications    Scheduled Meds: . sodium chloride   Intravenous Once  . aspirin  81 mg Oral Daily  . B-complex with vitamin C  1 tablet Per Tube Daily  . Chlorhexidine Gluconate Cloth  6 each Topical Daily  . Chlorhexidine Gluconate Cloth  6 each Topical Q0600  . darbepoetin (ARANESP) injection - DIALYSIS  100 mcg Intravenous Q Thu-HD  . famotidine  20 mg Oral Daily  . feeding supplement (ENSURE ENLIVE)  237 mL Oral TID BM  . folic acid  1 mg Oral Daily  . insulin aspart  0-9 Units Subcutaneous Q4H  . mouth rinse  15 mL Mouth Rinse BID  . metoprolol tartrate  25 mg Oral BID  . multivitamin with minerals  1 tablet Oral Daily  . QUEtiapine  25 mg Oral BID  . rosuvastatin  10 mg Oral q1800  . sodium chloride flush  3 mL Intravenous Q12H  . thiamine  100 mg Oral Daily  . THROMBI-PAD  3 each Topical Once  . ticagrelor  90 mg Per Tube BID  . zinc sulfate  220 mg Oral Daily   Continuous Infusions: . sodium chloride 500 mL (05/27/18 1647)   PRN Meds: sodium chloride, docusate, Gerhardt's butt cream, hydrALAZINE, LORazepam, nitroGLYCERIN, sodium chloride flush   Vital Signs    Vitals:   06/04/18 0349 06/04/18 0500 06/04/18 0746 06/04/18 0912  BP: (!) 161/88  (!) 176/89 (!) 174/108  Pulse: 93 (!) 101 (!) 107 (!) 115  Resp: (!) 27 (!) 33 (!) 23   Temp: 98.4 F (36.9 C)  98.3 F (36.8 C)   TempSrc: Oral  Oral   SpO2: 91% (!) 86% 96% 95%  Weight:  59.9 kg    Height:        Intake/Output Summary (Last 24 hours) at 06/04/2018 1020 Last data filed at 06/04/2018 0700 Gross per 24 hour  Intake 203 ml  Output 2000 ml  Net -1797 ml   Filed Weights   06/03/18 0732 06/03/18 1228 06/04/18 0500  Weight: 65 kg 63.2 kg  59.9 kg    Telemetry    Sinus tachycardia - Personally Reviewed  ECG    Sinus tachycardia. No acute ST T wave changes.- Personally Reviewed  Physical Exam   General: confused, thin older W male appearing agitated and more confused this am. Head: Normocephalic, atraumatic.  Neck: Supple, no JVD. Dialysis catheter in right anterior chest. No bleeding Lungs:  Resp regular,  CTA. Heart: RRR, S1, S2, systolic murmur; no rub. HD temp cath site dressing dry.  Abdomen: Soft, non-tender, non-distended with normoactive bowel sounds.  Extremities: No clubbing, cyanosis, edema. Distal pedal pulses are 2+ bilaterally. Neuro: Alert oriented to self and place. Moves all extremities spontaneously.  Labs    Chemistry Recent Labs  Lab 06/02/18 0240 06/02/18 1616 06/03/18 0759 06/04/18 0335  NA 136 138 137 138  K 5.2* 5.7* 6.3* 6.0*  CL 98 101 99 96*  CO2 24 23 22 26   GLUCOSE 98 84 118* 90  BUN 86* 104* 122* 66*  CREATININE 7.10* 8.62* 10.44* 7.59*  CALCIUM 7.8* 8.1* 8.1* 8.5*  PROT 5.4*  --  5.5* 5.9*  ALBUMIN 2.4*  --  2.5* 2.7*  2.6*  AST 45*  --  37 40  ALT 9  --  6 7  ALKPHOS 138*  --  126 134*  BILITOT 1.2  --  1.2 1.3*  GFRNONAA 8* 6* 5* 7*  GFRAA 9* 7* 6* 9*  ANIONGAP 14 14 16* 16*     Hematology Recent Labs  Lab 06/03/18 0759 06/03/18 2037 06/04/18 0335  WBC 11.9* 10.1 12.6*  RBC 2.65* 2.73* 2.87*  HGB 7.4* 7.6* 7.9*  HCT 22.6* 23.3* 25.0*  MCV 85.3 85.3 87.1  MCH 27.9 27.8 27.5  MCHC 32.7 32.6 31.6  RDW 21.4* 21.2* 21.6*  PLT 138* 165 176    Cardiac Enzymes Recent Labs  Lab 05/28/18 2027 05/29/18 0333 05/29/18 1445  TROPONINI 0.92* 0.87* 0.66*   No results for input(s): TROPIPOC in the last 168 hours.   BNPNo results for input(s): BNP, PROBNP in the last 168 hours.   DDimer No results for input(s): DDIMER in the last 168 hours.    Radiology    No results found.  Cardiac Studies        Post-Intervention Diagram           ------------------------------------------------------------------- ECHO Study Conclusions  - Left ventricle: The cavity size was normal. Systolic function was severely reduced. The estimated ejection fraction was in the range of 25% to 30%. Severe diffuse hypokinesis with regional variations. There is akinesis of the apicalanterior and apical myocardium. There is akinesis of the midanteroseptal myocardium. There is severe hypokinesis of the inferoseptal myocardium. Features are consistent with a pseudonormal left ventricular filling pattern, with concomitant abnormal relaxation and increased filling pressure (grade 2 diastolic dysfunction). Doppler parameters are consistent with high ventricular filling pressure. - Aortic valve: A bicuspid morphology cannot be excluded; mildly thickened, mildly calcified leaflets. There was mild to moderate regurgitation. Regurgitation pressure half-time: 718 ms. - Mitral valve: There was mild to moderate regurgitation. Valve area by pressure half-time: 2.39 cm^2. - Right ventricle: Systolic function was moderately reduced. - Atrial septum: There was increased thickness of the septum, consistent with lipomatous hypertrophy. - Tricuspid valve: There was mild regurgitation. - Pulmonary arteries: PA peak pressure: 33 mm Hg (S). - Inferior vena cava: The vessel was dilated. The respirophasic diameter changes were blunted (<50%), consistent with elevated central venous pressure. - Pericardium, extracardiac: A small, free-flowing pericardial effusion was identified circumferential to the heart. The fluid had no internal echoes.There was no evidence of hemodynamic compromise.  Impressions:  - severe LV dysfunction with diffuse HK but apica/anteroseptal and apical anterior AK. Recommend definity contrast study to rule out apical thrombus.  Patient Profile     52 y.o. male out of hospital cardiac  arrest, prior history of MI several years ago, myasthenia gravis post thymus resection, anterior ST elevation post LAD stent. Extubated on 8/2.   Assessment & Plan    1. Cardiac arrest: Underwent cath with PCI/DES x1 to the pLAD on 05/22/18.  Plan for DAPT with ASA/Brilinta for 12 months. ASA was held in the setting of thrombocytopenia, but has since been resumed. Platelet count improved.  On metoprolol 25mg  BID. Statin held 2/2 to elevated LFTs. LFTs have normalized. Statin therapy initiated with Crestor 10 mg daily.   2.  ICM: EF noted at 25-30%. Switched IV BB to PO. No room for ARB/ACEi given AKF. Increased work of breathing and hypoxia are concerning for acute pulmonary edema. CXR pending. May need dialysis again today. Ecg is stable showing no evidence of reinfarction.   3. Anoxic encephalopathy: EEG was abnormal but repeat now  improving. Was much more alert yesterday but worse today ? Due to hypoxia and respiratory distress.    4. Acute renal failure: Nephrology following. S/p placement of new dialysis catheter right subclavian.  Has significant bleeding from HD cath before but this has resolved.  Potassium is high 6.0 today - dialysis per Nephrology team.   5. Polysubstance abuse: + for cocaine on admission.   6. Acute blood loss anemia 2/2 to bleeding HD cath: Hgb dropped to 6, now s/p 2 units of PRBCs. Hgb up to 7.9 today. Ideally would like Hgb above 8 given coronary issues but need to balance with volume issues. Will monitor Hgb with dialysis.   Signed, Peter Martinique, MD  06/04/2018, 10:20 AM    For questions or updates, please contact [place] Please consult [email or web address] for contact info under Cardiology/STEMI.

## 2018-06-04 NOTE — Significant Event (Addendum)
Rapid Response Event Note RN called for change in mental status  Overview: Time Called: 0900 Arrival Time: 0905 Event Type: Neurologic  Initial Focused Assessment: On arrival pt supine in bed, skin warm and dry, restless and agitated, thrashing all over, seems to know wife at bedside, unable to answer any questions, did not follow commands. Per spouse pt has been restless and agitated since he was extubated in ICU but not to this degree. She states his agitation and AMS worsened yesterday between 1400-1600. BBS clear   Interventions: Several labs pending PCCM to evaluate  Plan of Care (if not transferred): Continue to monitor. Call RRT as needed RN Yvone Neu aware  Event Summary: Name of Physician Notified: Dr. Marval Regal  at (PTA RRT )  Name of Consulting Physician Notified: Dr. Charlies Silvers  at [phone]  Outcome: [place] in room and stabalized     [place], [place]

## 2018-06-04 NOTE — Progress Notes (Signed)
Pt system clotted twice, treatment ended with 17mins remaining, to avoid losing blood for a third system set up. Dr. Marval Regal notified.  Pt remains confused, restless but stable for transport.

## 2018-06-04 NOTE — Progress Notes (Signed)
Patient ID: Howard Thompson, male   DOB: 12/24/1965, 52 y.o.   MRN: [id number]  PROGRESS NOTE    Howard Thompson  YJE:[id number] DOB: 11/09/1965 DOA: 05/22/2018  PCP: No primary care provider on file.   Brief Narrative:  52 year old Caucasian male, with past medical history significant for coronary artery disease, myasthenia gravis, hypertension, cocaine abuse and alcohol abuse.  Patient has recently separated from the wife.  Patient was admitted on7/27/2019s/p unwitnessed out of hospital cardiac arrest, VF, unknown downtime. Per the electronic medical record the patient received 5 defibrillations and given 300 mg amiodarone. Upon arrival the patient was found to have a STEMI and was taken to the Cath Lab where a DES was placed and a 100% LAD lesion. Post catheter patient was taken for therapeutic hypothermia at the intensive care unit. Per the initial critical care documentation there was concern for posturing. EKGs initially showed prolonged QT at 560 which has improved since. Hospital course is significant for acute kidney injury, severe, anuric and hemodialysis dependent.  Assessment & Plan:   Active Problems Status post cardiac (V. fib) arrest/STEMI status post PCI: - Manage per cardiology - Continue ticagrelolor, metoprolol  Acute respiratory failure with hypoxia / Leukocytosis  - Also with ongoing cough - Obtain CXR this am  Acute kidney injury - Likely in the setting of v fib arrest and anoxic injury as well as contrast induced nephropathy  - Follow up renal - HD 06/03/2018  Hyperkalemia - Potassium 6.3 this am, repeat BMP  Normocytic anemia - Hgb 7.9 - Monitor CBC daily  Essential hypertension  - BP 161/88 - Continue metoprolol   Acute metabolic encephalopathy following V. fib arrest - Likely due to post vi fib arrest  - Pt restless this am but per his wife this is not new - Continue to monitor   Diet: dysphagia 1  DVT prophylaxis:  SCD's, ticagrelor Code  Status: partial code (no CPR, yes intubation) Family Communication: wife at bedside Disposition Plan: pt continues to be monitor in SDU setting, hallucinating, restless, needs eval for acute rehab placement   Procedures:  HD  Cardiac cath and PCI  Antimicrobials:   None    Subjective: Coughing, per pt wife patient seems to be hallucinating.  Objective: Vitals:   06/03/18 2329 06/04/18 0300 06/04/18 0349 06/04/18 0500  BP: (!) 156/84 (!) 172/74 (!) 161/88   Pulse: 90 86 93 (!) 101  Resp: 18  (!) 27 (!) 33  Temp: 98.4 F (36.9 C)  98.4 F (36.9 C)   TempSrc: Oral  Oral   SpO2: 96%  91% (!) 86%  Weight:    59.9 kg  Height:        Intake/Output Summary (Last 24 hours) at 06/04/2018 0706 Last data filed at 06/03/2018 1800 Gross per 24 hour  Intake 203 ml  Output 2000 ml  Net -1797 ml   Filed Weights   06/03/18 0732 06/03/18 1228 06/04/18 0500  Weight: 65 kg 63.2 kg 59.9 kg    Examination:  General exam: Appears restless, talking incoherently  Respiratory system: Clear to auscultation. Respiratory effort normal. Cardiovascular system: S1 & S2 heard, tachycardic . Gastrointestinal system: Abdomen is nondistended, soft and nontender. No organomegaly or masses felt. Normal bowel sounds heard. Central nervous system: restless, talking incoherently  Extremities: Symmetric 5 x 5 power. Skin: No rashes, lesions or ulcers Psychiatry: Unable to assess due to altered mental status    Data Reviewed: I have personally reviewed following labs and imaging studies  CBC:  Recent Labs  Lab 06/02/18 1616 06/02/18 2142 06/03/18 0759 06/03/18 2037 06/04/18 0335  WBC 10.5 11.2* 11.9* 10.1 12.6*  NEUTROABS 9.1* 9.7* 10.6* 8.5* PENDING  HGB 7.2* 6.8* 7.4* 7.6* 7.9*  HCT 21.6* 20.8* 22.6* 23.3* 25.0*  MCV 86.1 86.0 85.3 85.3 87.1  PLT 151 141* 138* 165 480   Basic Metabolic Panel: Recent Labs  Lab 05/31/18 0409 05/31/18 1847 06/01/18 0251 06/02/18 0240 06/02/18 1616  06/03/18 0759 06/04/18 0335  NA 141 139 140 136 138 137  --   K 4.8 5.3* 5.5* 5.2* 5.7* 6.3*  --   CL 105 103 103 98 101 99  --   CO2 24 22 20* 24 23 22   --   GLUCOSE 104* 104* 108* 98 84 118*  --   BUN 77* 105* 119* 86* 104* 122*  --   CREATININE 5.60* 7.20* 8.07* 7.10* 8.62* 10.44*  --   CALCIUM 8.1* 8.2* 8.3* 7.8* 8.1* 8.1*  --   MG 3.3*  --  3.6* 3.0*  --  3.7* 2.6*  PHOS 4.3  --  6.8* 6.3*  --  9.5* 6.9*   GFR: Estimated Creatinine Clearance: 7.1 mL/min (A) (by C-G formula based on SCr of 10.44 mg/dL (H)). Liver Function Tests: Recent Labs  Lab 05/31/18 0409 06/01/18 0251 06/02/18 0240 06/03/18 0759 06/04/18 0335  AST 71* 52* 45* 37 40  ALT 115* 26 9 6 7   ALKPHOS 172* 150* 138* 126 134*  BILITOT 1.6* 1.2 1.2 1.2 1.3*  PROT 5.6* 5.6* 5.4* 5.5* 5.9*  ALBUMIN 2.4* 2.4* 2.4* 2.5* 2.6*   No results for input(s): LIPASE, AMYLASE in the last 168 hours. No results for input(s): AMMONIA in the last 168 hours. Coagulation Profile: Recent Labs  Lab 05/31/18 1003 06/01/18 0251 06/01/18 2231  INR 1.10 1.14 1.18   Cardiac Enzymes: Recent Labs  Lab 05/28/18 2027 05/29/18 0333 05/29/18 1445  TROPONINI 0.92* 0.87* 0.66*   BNP (last 3 results) No results for input(s): PROBNP in the last 8760 hours. HbA1C: No results for input(s): HGBA1C in the last 72 hours. CBG: Recent Labs  Lab 06/03/18 1317 06/03/18 1657 06/03/18 2104 06/03/18 2335 06/04/18 0341  GLUCAP 89 79 85 84 70   Lipid Profile: No results for input(s): CHOL, HDL, LDLCALC, TRIG, CHOLHDL, LDLDIRECT in the last 72 hours. Thyroid Function Tests: No results for input(s): TSH, T4TOTAL, FREET4, T3FREE, THYROIDAB in the last 72 hours. Anemia Panel: Recent Labs    06/03/18 1331  FERRITIN 121  TIBC 361  IRON 55   Urine analysis:    Component Value Date/Time   COLORURINE YELLOW 05/22/2018 2024   APPEARANCEUR CLEAR 05/22/2018 2024   LABSPEC >1.046 (H) 05/22/2018 2024   PHURINE 5.0 05/22/2018 2024    GLUCOSEU NEGATIVE 05/22/2018 2024   HGBUR LARGE (A) 05/22/2018 2024   BILIRUBINUR NEGATIVE 05/22/2018 2024   KETONESUR NEGATIVE 05/22/2018 2024   PROTEINUR 30 (A) 05/22/2018 2024   NITRITE NEGATIVE 05/22/2018 2024   LEUKOCYTESUR NEGATIVE 05/22/2018 2024   Sepsis Labs: @LABRCNTIP (procalcitonin:4,lacticidven:4)   )No results found for this or any previous visit (from the past 240 hour(s)).    Radiology Studies: Ir Fluoro Guide Cv Line Right  Result Date: 05/31/2018 CLINICAL DATA:  Acute kidney injury, needs durable venous access for hemodialysis EXAM: TUNNELED HEMODIALYSIS CATHETER PLACEMENT WITH ULTRASOUND AND FLUOROSCOPIC GUIDANCE TECHNIQUE: The procedure, risks, benefits, and alternatives were explained to the patient and family. Questions regarding the procedure were encouraged and answered. The family understands and consents to  the procedure. As antibiotic prophylaxis, cefazolin 2 g was ordered pre-procedure and administered intravenously within one hour of incision.Patency of the right IJ vein was confirmed with ultrasound with image documentation. An appropriate skin site was determined. Region was prepped using maximum barrier technique including cap and mask, sterile gown, sterile gloves, large sterile sheet, and Chlorhexidine as cutaneous antisepsis. The region was infiltrated locally with 1% lidocaine. Intravenous Fentanyl and Versed were administered as conscious sedation during continuous monitoring of the patient's level of consciousness and physiological / cardiorespiratory status by the radiology RN, with a total moderate sedation time of 33 minutes. Under real-time ultrasound guidance, the right IJ vein was accessed with a 21 gauge micropuncture needle; the needle tip within the vein was confirmed with ultrasound image documentation. Needle exchanged over the 018 guidewire for transitional dilator, which allowed advancement of a Benson wire into the IVC. Over this, an MPA catheter  was advanced. A Palindrome 23 hemodialysis catheter was tunneled from the right anterior chest wall approach to the right IJ dermatotomy site. The MPA catheter was exchanged over an Amplatz wire for serial vascular dilators which allow placement of a peel-away sheath, through which the catheter was advanced under intermittent fluoroscopy, positioned with its tips in the proximal and midright atrium. Spot chest radiograph confirms good catheter position. No pneumothorax. Catheter was flushed and primed per protocol. Catheter secured externally with O Prolene sutures. The right IJ dermatotomy site was closed with Dermabond. COMPLICATIONS: COMPLICATIONS None immediate FLUOROSCOPY TIME:  24 seconds; 2 mGy COMPARISON:  None IMPRESSION: 1. Technically successful placement of tunneled right IJ hemodialysis catheter with ultrasound and fluoroscopic guidance. Ready for routine use. ACCESS: Remains approachable for percutaneous intervention as needed. Electronically Signed   By: Lucrezia Europe M.D.   On: 05/31/2018 16:47   Ir US Guide Vasc Access Right Result Date: 05/31/2018 1. Technically successful placement of tunneled right IJ hemodialysis catheter with ultrasound and fluoroscopic guidance. Ready for routine use. ACCESS: Remains approachable for percutaneous intervention as needed. Electronically Signed   By: Lucrezia Europe M.D.   On: 05/31/2018 16:47      Scheduled Meds: . aspirin  81 mg Oral Daily  . B-comple vitamin C  1 tablet Per Tube Daily  . darbepoetin injection - DIALYSIS  100 mcg Intravenous Q Thu-HD  . famotidine  20 mg Oral Daily  . feeding supplement   237 mL Oral TID BM  . folic acid  1 mg Oral Daily  . insulin aspart  0-9 Units Subcutaneous Q4H  . metoprolol tartrate  25 mg Oral BID  . multivitamin   1 tablet Oral Daily  . QUEtiapine  25 mg Oral BID  . rosuvastatin  10 mg Oral q1800  . thiamine  100 mg Oral Daily  . ticagrelor  90 mg Per Tube BID  . zinc sulfate  220 mg Oral Daily    Continuous Infusions: . sodium chloride 500 mL (05/27/18 1647)     LOS: 13 days    Time spent: 35 minutes Greater than 50% of the time spent on counseling and coordinating the care.   Leisa Lenz, MD [facility] Pager [phone]  If 7PM-7AM, please contact night-coverage [email or web address] Password [email or web address] 06/04/2018, 7:06 AM

## 2018-06-04 NOTE — Progress Notes (Signed)
Patient back to room via bed from dialysis. Patient appears restless with legs and arms drawn in. Family at bedside. Attempts to keep patient calm while patient is wiped with CHG wipes as well as pericare given. Small stool noted. Foam intact to buttocks.

## 2018-06-04 NOTE — Progress Notes (Signed)
S: agitated and delirious which is a change from yesterday per staff. O:BP (!) 185/109 (BP Location: Left Arm)   Pulse (!) 110   Temp 98.1 F (36.7 C) (Axillary)   Resp (!) 38   Ht 5\' 10"  (1.778 m)   Wt 59.9 kg   SpO2 95%   BMI 18.95 kg/m   Intake/Output Summary (Last 24 hours) at 06/04/2018 1228 Last data filed at 06/04/2018 1210 Gross per 24 hour  Intake 203 ml  Output 0 ml  Net 203 ml   Intake/Output: I/O last 3 completed shifts: In: 657 [P.O.:360; I.V.:3; Blood:380] Out: 2000 [Other:2000]  Intake/Output this shift:  No intake/output data recorded. Weight change: -2.1 kg Gen: NAD CVS: tachy Resp: scattered rhonchi Abd: +BS, soft, mild tenderness to LLQ Ext: no edema  Recent Labs  Lab 05/29/18 0333 05/30/18 0357 05/31/18 0409 05/31/18 1847 06/01/18 0251 06/02/18 0240 06/02/18 1616 06/03/18 0759 06/04/18 0335 06/04/18 0943  NA 139 138 141 139 140 136 138 137 138 138   K 5.1 4.7 4.8 5.3* 5.5* 5.2* 5.7* 6.3* 6.0* 6.2*  CL 104 101 105 103 103 98 101 99 96* 95*  CO2 27 26 24 22  20* 24 23 22 26 25   GLUCOSE 88 87 104* 104* 108* 98 84 118* 90 88  BUN 35* 38* 77* 105* 119* 86* 104* 122* 66* 71*  CREATININE 2.61* 2.74* 5.60* 7.20* 8.07* 7.10* 8.62* 10.44* 7.59* 8.11*  ALBUMIN 2.4* 2.4* 2.4*  --  2.4* 2.4*  --  2.5* 2.7*  2.6* 2.6*  CALCIUM 8.0* 8.2* 8.1* 8.2* 8.3* 7.8* 8.1* 8.1* 8.5* 8.5*  PHOS 2.5 2.5 4.3  --  6.8* 6.3*  --  9.5* 7.5*  6.9*  --   AST 263* 130* 71*  --  52* 45*  --  37 40 36  ALT 1,326* 577* 115*  --  26 9  --  6 7 7    Liver Function Tests: Recent Labs  Lab 06/03/18 0759 06/04/18 0335 06/04/18 0943  AST 37 40 36  ALT 6 7 7   ALKPHOS 126 134* 137*  BILITOT 1.2 1.3* 1.4*  PROT 5.5* 5.9* 6.1*  ALBUMIN 2.5* 2.7*  2.6* 2.6*   No results for input(s): LIPASE, AMYLASE in the last 168 hours. Recent Labs  Lab 06/04/18 0939  AMMONIA 22   CBC: Recent Labs  Lab 06/02/18 2142 06/03/18 0759 06/03/18 2037 06/04/18 0335 06/04/18 0943  WBC  11.2* 11.9* 10.1 12.6* 13.5*  NEUTROABS 9.7* 10.6* 8.5* 10.9* 12.0*  HGB 6.8* 7.4* 7.6* 7.9* 8.0*  HCT 20.8* 22.6* 23.3* 25.0* 24.7*  MCV 86.0 85.3 85.3 87.1 86.1  PLT 141* 138* 165 176 209   Cardiac Enzymes: Recent Labs  Lab 05/28/18 2027 05/29/18 0333 05/29/18 1445  TROPONINI 0.92* 0.87* 0.66*   CBG: Recent Labs  Lab 06/03/18 2104 06/03/18 2335 06/04/18 0341 06/04/18 0746 06/04/18 1221  GLUCAP 85 84 70 83 103*    Iron Studies:  Recent Labs    06/03/18 1331  IRON 55  TIBC 361  FERRITIN 121   Studies/Results: Dg Chest Port 1 View  Result Date: 06/04/2018 CLINICAL DATA:  Cough. EXAM: PORTABLE CHEST 1 VIEW COMPARISON:  Radiograph of May 29, 2018. FINDINGS: Stable cardiomediastinal silhouette. Interval placement of right internal jugular dialysis catheter with distal tip in expected position of cavoatrial junction. No pneumothorax is noted. Slightly improved bilateral lung opacities are noted, left greater than right, most consistent with pneumonia or possibly edema. No significant pleural effusion is noted. Bony thorax  is unremarkable. IMPRESSION: Interval placement of right internal jugular dialysis catheter. Slightly improved bilateral pneumonia or edema is noted, left greater than right. Electronically Signed   By: Marijo Conception, M.D.   On: 06/04/2018 12:14   . aspirin  81 mg Oral Daily  . B-complex with vitamin C  1 tablet Per Tube Daily  . Chlorhexidine Gluconate Cloth  6 each Topical Daily  . Chlorhexidine Gluconate Cloth  6 each Topical Q0600  . darbepoetin (ARANESP) injection - DIALYSIS  100 mcg Intravenous Q Thu-HD  . famotidine  20 mg Oral Daily  . folic acid  1 mg Oral Daily  . insulin aspart  0-9 Units Subcutaneous Q4H  . mouth rinse  15 mL Mouth Rinse BID  . metoprolol tartrate  25 mg Oral BID  . multivitamin with minerals  1 tablet Oral Daily  . QUEtiapine  25 mg Oral BID  . rosuvastatin  10 mg Oral q1800  . sodium chloride flush  3 mL Intravenous  Q12H  . thiamine  100 mg Oral Daily  . ticagrelor  90 mg Per Tube BID  . zinc sulfate  220 mg Oral Daily    BMET    Component Value Date/Time   NA 138 06/04/2018 0943   K 6.2 (H) 06/04/2018 0943   CL 95 (L) 06/04/2018 0943   CO2 25 06/04/2018 0943   GLUCOSE 88 06/04/2018 0943   BUN 71 (H) 06/04/2018 0943   CREATININE 8.11 (H) 06/04/2018 0943   CALCIUM 8.5 (L) 06/04/2018 0943   GFRNONAA 7 (L) 06/04/2018 0943   GFRAA 8 (L) 06/04/2018 0943   CBC    Component Value Date/Time   WBC 13.5 (H) 06/04/2018 0943   RBC 2.87 (L) 06/04/2018 0943   HGB 8.0 (L) 06/04/2018 0943   HCT 24.7 (L) 06/04/2018 0943   PLT 209 06/04/2018 0943   MCV 86.1 06/04/2018 0943   MCH 27.9 06/04/2018 0943   MCHC 32.4 06/04/2018 0943   RDW 21.6 (H) 06/04/2018 0943   LYMPHSABS 0.4 (L) 06/04/2018 0943   MONOABS 1.1 (H) 06/04/2018 0943   EOSABS 0.0 06/04/2018 0943   BASOSABS 0.0 06/04/2018 0943     Assessment/Plan:  1. 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  Has been HD dependent and was on CRRT until 05/30/18 and has since been tolerating intermittent HD.  Last treatment was yesterday.  Continue with HD qTTS for now and follow for any signs of renal recovery.  Remains anuric. 2. AMS- per family and staff he was more oriented and alert yesterday but since last night has been agitated and more confused.  W/u per primary svc   Pt was hypoxic and CXR with ? Bilateral Pneumonia L>R. Consider repeat ct scan and continue with oxygen. 3. Hyperkalemia- unclear etiology as he had HD yesterday without issues.  Will plan for HD again with low K bath in am and give lokelma today and follow. 4. ABLA and anemia of critical illness- bled from HD cath site which has stopped, transfuse prn and cont with ESA on HD 5. Cardiac arrest/CAD/ICMP- s/p PCI/DES 6. Polysubstance abuse- + cocaine 7. Anoxic encephalopathy 8. HTN- cont with meds 9. Moderate protein malnutrition.  Donetta Potts, MD [employer] [phone]

## 2018-06-04 NOTE — Progress Notes (Addendum)
Name: Howard Thompson MRN: [id number] DOB: May 10, 1966    ADMISSION DATE:  05/22/2018 CONSULTATION DATE:  06/04/18  REFERRING MD :  Dr. Gwenlyn Found / Dr. Charlies Silvers   CHIEF COMPLAINT:  Altered Mental Status    HISTORY OF PRESENT ILLNESS:  52 y.o.maleadmitted on7/27/2019s/p unwitnessed out of hospital cardiac arrest, VF, unknown downtime, past medical history of coronary artery disease, myasthenia gravis, hypertension. Per the electronic medical record the patient received 5 defibrillations and was given 300 mg amiodarone. Upon arrival the patient was found to have a STEMI and was taken to the Cath Lab where a DES was placed and a 100% LAD lesion. Post catheter patient was taken for therapeutic hypothermia to the intensive care unit. Per the initial critical care documentation there was concern for posturing. EKGs initially with a prolonged QT at 560, now improved. He was initially placed in a c-collar, presenting head CT and CT cervical spine negative for acute abnormalities. The patient had a prolonged ICU course due to cardiogenic shock, STEMI, AKI, coagulopathy and encephalopathy.  Hypothermia completed 7/28 with rewarming.  He had AKI requiring CVVHD which was initiated on 7/30.  EEG on 7/31 showed diffuse slowing but no seizure.  He was extubated on 8/2 without difficulty.  Soft tissue of neck cleared, c-collar removed.  The patient has had intermittent periods of intermittent agitation requiring haldol, ativan and precedex. He continues to receive intermittent HD.     SUBJECTIVE:  RN reports ongoing intermittent agitation.  Pt states he hurts "all over".  Wife at bedside. Pt just urinated on himself.    VITAL SIGNS: Temp:  [98 F (36.7 C)-99.2 F (37.3 C)] 98.3 F (36.8 C) (08/09 0746) Pulse Rate:  [62-115] 115 (08/09 0912) Resp:  [16-33] 23 (08/09 0746) BP: (140-176)/(60-108) 174/108 (08/09 0912) SpO2:  [86 %-100 %] 95 % (08/09 0912) Weight:  [59.9 kg-63.2 kg] 59.9 kg (08/09  0500)  PHYSICAL EXAMINATION: General:  Adult male lying in bed, appears agitated  Neuro:  Awake, alert, MAE, oriented to self, place. States Saturday for the year.  HEENT:  MM pink/dry, old dried blood on teeth, pupils 29mm  Cardiovascular:  s1s2 rrr, tachy, ST on monitor  Lungs:  Tachypnea but not labored, wheezing on right, crackles on the left  Abdomen:  Soft/non-tender, bsx4 active  Musculoskeletal:  No acute deformities  Skin:  Warm/dry, no edema   Recent Labs  Lab 06/02/18 1616 06/03/18 0759 06/04/18 0335  NA 138 137 138  K 5.7* 6.3* 6.0*  CL 101 99 96*  CO2 23 22 26   BUN 104* 122* 66*  CREATININE 8.62* 10.44* 7.59*  GLUCOSE 84 118* 90    Recent Labs  Lab 06/03/18 0759 06/03/18 2037 06/04/18 0335  HGB 7.4* 7.6* 7.9*  HCT 22.6* 23.3* 25.0*  WBC 11.9* 10.1 12.6*  PLT 138* 165 176    No results found.   SIGNIFICANT EVENTS  7/27  Admit post arrest, cocaine positive  7/28  Cooling stopped/rewarming  7/30  CRRT initiated, on levo  7/31  EEG with diffuse slowing  8/01  Following commands 8/02  Extubated  STUDIES   CULTURES Sputum 7/27 >> MSSA  BCx2 7/27 >> anaerobic bottle only with propionibacterium acnes   ANTIBIOTICS     ASSESSMENT / PLAN:  Discussion:  52 y/o M admitted 7/27 post cardiac arrest.  Prolonged hospital course with hypothermia protocol. Complicated by AKI, metabolic encephalopathy, prolonged QTc, coagulopathy and agitated delirium.   Agitated Delirium - suspect multifactorial in the setting of  prolonged critical illness, ongoing renal failure requiring HD and background substance abuse.   Additionally, he has had recent issues with bleeding from his HD catheter requiring PRBC (Hgb now stable).    Plan: Promote sleep wake cycle > ensure lights on during day, off at night Minimize sleep interruptions at night  HD per Nephrology  Continue PRN ativan, seroquel  May need scheduled ativan (ICU washout, could be withdrawing from ICU  meds) Rule out infectious process > assess PCT, blood cultures (afebrile, no WBC) Consider psychiatry input    Tachypnea - mild wheezing on exam, hx of smoking  Bilateral Pleural Effusions   Plan: PRN albuterol  Aspiration precautions, D1 diet  Follow intermittent CXR  AGMA with Concomitant Respiratory Alkalosis - suspect in setting of renal failure Hyperkalemia / Electrolyte Disturbance   Plan: Per Nephrology    HTN  Tachycardia   Plan: Per Primary / Cardiology   Noe Gens, NP-C [facility] Pgr: [phone] or if no answer [phone] 06/04/2018, 10:32 AM

## 2018-06-04 NOTE — Progress Notes (Signed)
Per wife/family request, a young lady by the name of Howard Thompson is NOT to visit with the patient.  Patient is being made a XXX for purposes of patient safety.

## 2018-06-04 NOTE — Progress Notes (Signed)
SLP Cancellation Note  Patient Details Name: Howard Thompson MRN: [id number] DOB: 1966-07-29   Cancelled treatment:       Reason Eval/Treat Not Completed: Patient not medically ready. Mental status not appropriate for SLP interventions. Again, consider Cortrak placement for alternative method of nutrition given struggles with feeding, waxing and waning mental status and risk of postprandial aspiration given finding of esophageal stasis on MBS.    Howard Thompson, Katherene Ponto 06/04/2018, 10:44 AM

## 2018-06-04 NOTE — Consult Note (Signed)
Physical Medicine and Rehabilitation Consult Reason for Consult: Decreased functional mobility Referring Physician: Dr. Gwenlyn Found   HPI: Howard Thompson is a 52 y.o. right-handed male with history of myasthenia gravis status post thymus resection, hypertension, cocaine and alcohol abuse.  Per chart review patient has been separated for 3 years and lives alone.  Self-employed owning a heavy equipment business.  He has a brother in the area that works.  Wife willing to provide assistance on discharge.  Admitted 05/22/2018 after being found down after an uncertain period of time next to his mailbox and CPR initiated at the scene by a neighbor.  Patient was shocked 4 times.  Urine drug screen positive cocaine and benzos.  Troponin  0.11, WBC 14,700.  Patient did require intubation.  EEG negative for seizure.  Cranial CT scan negative.  Echocardiogram with ejection fraction of 30% severe diffuse hypokinesis, grade 2 diastolic dysfunction.  Underwent cardiac catheterization with PCI/DES x1.  Placed on aspirin and Brilinta.  Patient with persistent agitation and restlessness.  Hospital course AKI with renal services consulted 05/24/2018 with creatinine 2.01 elevated to 10.44 felt to be related to hypotension and has since been placed on dialysis and underwent IJ tunneled HD catheter 05/31/2018.  Patient was extubated 05/28/2018.  Dysphagia #1 thin liquid diet.  Therapy evaluations completed and ongoing with recommendations of physical medicine rehab consult.   Review of Systems  Unable to perform ROS: Acuity of condition   History reviewed. No pertinent past medical history. Past Surgical History:  Procedure Laterality Date  . CORONARY STENT INTERVENTION N/A 05/22/2018   Procedure: CORONARY STENT INTERVENTION;  Surgeon: Lorretta Harp, MD;  Location: [facility];  Service: Cardiovascular;  Laterality: N/A;  . IR FLUORO GUIDE CV LINE RIGHT  05/31/2018  . IR US GUIDE VASC ACCESS RIGHT  05/31/2018  . LEFT  HEART CATH AND CORONARY ANGIOGRAPHY N/A 05/22/2018   Procedure: LEFT HEART CATH AND CORONARY ANGIOGRAPHY;  Surgeon: Lorretta Harp, MD;  Location: [facility];  Service: Cardiovascular;  Laterality: N/A;   History reviewed. No pertinent family history. Social History:  reports that he has been smoking cigarettes. He has been smoking about 1.00 pack per day. He uses smokeless tobacco. His alcohol and drug histories are not on file. Allergies: No Known Allergies Medications Prior to Admission  Medication Sig Dispense Refill  . nitroGLYCERIN (NITROSTAT) 0.4 MG SL tablet Place 0.4 mg under the tongue every 5 (five) minutes as needed for chest pain.      Home: Home Living Family/patient expects to be discharged to:: Private residence Living Arrangements: Alone, Other (Comment)(separated from wife, if willing wife has agreed to have him stay with her d/c) Available Help at Discharge: Family Type of Home: House Home Access: Stairs to enter Technical brewer of Steps: 3  Home Layout: One level Home Equipment: None Additional Comments: home information provided by wife  Functional History: Prior Function Level of Independence: Independent Comments: ADLs, owns a heavy equipment business  Functional Status:  Mobility: Bed Mobility Overal bed mobility: Needs Assistance Bed Mobility: Supine to Sit Supine to sit: Mod assist Sit to supine: Min assist General bed mobility comments: pt limited by decreased trunk control with mod a to swing feet off and on bed, cues for initiation, sequence, and positioning of hands and legs  Transfers Overall transfer level: Needs assistance Equipment used: 2 person hand held assist Transfers: Sit to/from Stand Sit to Stand: Mod assist, +2 physical assistance General  transfer comment: bil knees blocked to achieve standing and stabilization, able to decrease assist with time. cues for initiation, sequence, and  positioning Ambulation/Gait Ambulation/Gait assistance: Mod assist, +2 physical assistance(able to progress to min/mod +2) Gait Distance (Feet): 150 Feet Assistive device: 2 person hand held assist Gait Pattern/deviations: Ataxic, Scissoring, Narrow base of support General Gait Details: sporadic episodes of marching, ataxic and scissoring gait throughout.  Gait velocity: decreased  Gait velocity interpretation: <1.8 ft/sec, indicate of risk for recurrent falls    ADL: ADL Overall ADL's : Needs assistance/impaired General ADL Comments: Due to poor cognition, strength, and balance, pt requiring Total A for ADLs and bed mobility.  Cognition: Cognition Overall Cognitive Status: Impaired/Different from baseline Orientation Level: Disoriented to time, Disoriented to place, Disoriented to situation, Oriented to person Cognition Arousal/Alertness: Awake/alert Behavior During Therapy: Restless Overall Cognitive Status: Impaired/Different from baseline Area of Impairment: Orientation, Awareness, Problem solving, Memory, Attention, Following commands, Safety/judgement Orientation Level: Disoriented to, Person, Situation Current Attention Level: Sustained Memory: Decreased recall of precautions, Decreased short-term memory Following Commands: Follows one step commands inconsistently Safety/Judgement: Decreased awareness of safety, Decreased awareness of deficits Awareness: Intellectual Problem Solving: Slow processing, Decreased initiation, Difficulty sequencing, Requires verbal cues, Requires tactile cues General Comments: Pt requires frequent cues for attention with increased processing times. inconsistently follows simple commands   Blood pressure (!) 161/88, pulse (!) 101, temperature 98.4 F (36.9 C), temperature source Oral, resp. rate (!) 33, height 5\' 10"  (1.778 m), weight 59.9 kg, SpO2 (!) 86 %. Physical Exam  Neurological:  Patient is very restless and agitated.  Exam overall was  limited.  He did not follow commands.    Results for orders placed or performed during the hospital encounter of 05/22/18 (from the past 24 hour(s))  CBC with Differential/Platelet     Status: Abnormal   Collection Time: 06/03/18  7:59 AM  Result Value Ref Range   WBC 11.9 (H) 4.0 - 10.5 K/uL   RBC 2.65 (L) 4.22 - 5.81 MIL/uL   Hemoglobin 7.4 (L) 13.0 - 17.0 g/dL   HCT 22.6 (L) 39.0 - 52.0 %   MCV 85.3 78.0 - 100.0 fL   MCH 27.9 26.0 - 34.0 pg   MCHC 32.7 30.0 - 36.0 g/dL   RDW 21.4 (H) 11.5 - 15.5 %   Platelets 138 (L) 150 - 400 K/uL   Neutrophils Relative % 89 %   Lymphocytes Relative 4 %   Monocytes Relative 7 %   Eosinophils Relative 0 %   Basophils Relative 0 %   Neutro Abs 10.6 (H) 1.7 - 7.7 K/uL   Lymphs Abs 0.5 (L) 0.7 - 4.0 K/uL   Monocytes Absolute 0.8 0.1 - 1.0 K/uL   Eosinophils Absolute 0.0 0.0 - 0.7 K/uL   Basophils Absolute 0.0 0.0 - 0.1 K/uL   RBC Morphology BASOPHILIC STIPPLING   Magnesium     Status: Abnormal   Collection Time: 06/03/18  7:59 AM  Result Value Ref Range   Magnesium 3.7 (H) 1.7 - 2.4 mg/dL  Phosphorus     Status: Abnormal   Collection Time: 06/03/18  7:59 AM  Result Value Ref Range   Phosphorus 9.5 (H) 2.5 - 4.6 mg/dL  Basic metabolic panel     Status: Abnormal   Collection Time: 06/03/18  7:59 AM  Result Value Ref Range   Sodium 137 135 - 145 mmol/L   Potassium 6.3 (HH) 3.5 - 5.1 mmol/L   Chloride 99 98 - 111 mmol/L   CO2  22 22 - 32 mmol/L   Glucose, Bld 118 (H) 70 - 99 mg/dL   BUN 122 (H) 6 - 20 mg/dL   Creatinine, Ser 10.44 (H) 0.61 - 1.24 mg/dL   Calcium 8.1 (L) 8.9 - 10.3 mg/dL   GFR calc non Af Amer 5 (L) >60 mL/min   GFR calc Af Amer 6 (L) >60 mL/min   Anion gap 16 (H) 5 - 15  Hepatic function panel     Status: Abnormal   Collection Time: 06/03/18  7:59 AM  Result Value Ref Range   Total Protein 5.5 (L) 6.5 - 8.1 g/dL   Albumin 2.5 (L) 3.5 - 5.0 g/dL   AST 37 15 - 41 U/L   ALT 6 0 - 44 U/L   Alkaline Phosphatase 126 38 -  126 U/L   Total Bilirubin 1.2 0.3 - 1.2 mg/dL   Bilirubin, Direct 0.3 (H) 0.0 - 0.2 mg/dL   Indirect Bilirubin 0.9 0.3 - 0.9 mg/dL  Glucose, capillary     Status: Abnormal   Collection Time: 06/03/18 12:22 PM  Result Value Ref Range   Glucose-Capillary 66 (L) 70 - 99 mg/dL  Glucose, capillary     Status: None   Collection Time: 06/03/18  1:17 PM  Result Value Ref Range   Glucose-Capillary 89 70 - 99 mg/dL  Iron and TIBC     Status: Abnormal   Collection Time: 06/03/18  1:31 PM  Result Value Ref Range   Iron 55 45 - 182 ug/dL   TIBC 361 250 - 450 ug/dL   Saturation Ratios 15 (L) 17.9 - 39.5 %   UIBC 306 ug/dL  Ferritin     Status: None   Collection Time: 06/03/18  1:31 PM  Result Value Ref Range   Ferritin 121 24 - 336 ng/mL  Glucose, capillary     Status: None   Collection Time: 06/03/18  4:57 PM  Result Value Ref Range   Glucose-Capillary 79 70 - 99 mg/dL  CBC with Differential/Platelet     Status: Abnormal   Collection Time: 06/03/18  8:37 PM  Result Value Ref Range   WBC 10.1 4.0 - 10.5 K/uL   RBC 2.73 (L) 4.22 - 5.81 MIL/uL   Hemoglobin 7.6 (L) 13.0 - 17.0 g/dL   HCT 23.3 (L) 39.0 - 52.0 %   MCV 85.3 78.0 - 100.0 fL   MCH 27.8 26.0 - 34.0 pg   MCHC 32.6 30.0 - 36.0 g/dL   RDW 21.2 (H) 11.5 - 15.5 %   Platelets 165 150 - 400 K/uL   Neutrophils Relative % 84 %   Lymphocytes Relative 6 %   Monocytes Relative 9 %   Eosinophils Relative 1 %   Basophils Relative 0 %   Neutro Abs 8.5 (H) 1.7 - 7.7 K/uL   Lymphs Abs 0.6 (L) 0.7 - 4.0 K/uL   Monocytes Absolute 0.9 0.1 - 1.0 K/uL   Eosinophils Absolute 0.1 0.0 - 0.7 K/uL   Basophils Absolute 0.0 0.0 - 0.1 K/uL   RBC Morphology POLYCHROMASIA PRESENT    WBC Morphology MILD LEFT SHIFT (1-5% METAS, OCC MYELO, OCC BANDS)   Glucose, capillary     Status: None   Collection Time: 06/03/18  9:04 PM  Result Value Ref Range   Glucose-Capillary 85 70 - 99 mg/dL   Comment 1 Notify RN    Comment 2 Document in Chart   Glucose,  capillary     Status: None   Collection Time: 06/03/18  11:35 PM  Result Value Ref Range   Glucose-Capillary 84 70 - 99 mg/dL  CBC with Differential/Platelet     Status: Abnormal (Preliminary result)   Collection Time: 06/04/18  3:35 AM  Result Value Ref Range   WBC 12.6 (H) 4.0 - 10.5 K/uL   RBC 2.87 (L) 4.22 - 5.81 MIL/uL   Hemoglobin 7.9 (L) 13.0 - 17.0 g/dL   HCT 25.0 (L) 39.0 - 52.0 %   MCV 87.1 78.0 - 100.0 fL   MCH 27.5 26.0 - 34.0 pg   MCHC 31.6 30.0 - 36.0 g/dL   RDW 21.6 (H) 11.5 - 15.5 %   Platelets 176 150 - 400 K/uL   Neutrophils Relative % PENDING %   Neutro Abs PENDING 1.7 - 7.7 K/uL   Band Neutrophils PENDING %   Lymphocytes Relative PENDING %   Lymphs Abs PENDING 0.7 - 4.0 K/uL   Monocytes Relative PENDING %   Monocytes Absolute PENDING 0.1 - 1.0 K/uL   Eosinophils Relative PENDING %   Eosinophils Absolute PENDING 0.0 - 0.7 K/uL   Basophils Relative PENDING %   Basophils Absolute PENDING 0.0 - 0.1 K/uL   WBC Morphology PENDING    RBC Morphology PENDING    Smear Review PENDING    nRBC PENDING 0 /100 WBC   Metamyelocytes Relative PENDING %   Myelocytes PENDING %   Promyelocytes Relative PENDING %   Blasts PENDING %  Magnesium     Status: Abnormal   Collection Time: 06/04/18  3:35 AM  Result Value Ref Range   Magnesium 2.6 (H) 1.7 - 2.4 mg/dL  Phosphorus     Status: Abnormal   Collection Time: 06/04/18  3:35 AM  Result Value Ref Range   Phosphorus 6.9 (H) 2.5 - 4.6 mg/dL  Hepatic function panel     Status: Abnormal   Collection Time: 06/04/18  3:35 AM  Result Value Ref Range   Total Protein 5.9 (L) 6.5 - 8.1 g/dL   Albumin 2.6 (L) 3.5 - 5.0 g/dL   AST 40 15 - 41 U/L   ALT 7 0 - 44 U/L   Alkaline Phosphatase 134 (H) 38 - 126 U/L   Total Bilirubin 1.3 (H) 0.3 - 1.2 mg/dL   Bilirubin, Direct 0.4 (H) 0.0 - 0.2 mg/dL   Indirect Bilirubin 0.9 0.3 - 0.9 mg/dL  Glucose, capillary     Status: None   Collection Time: 06/04/18  3:41 AM  Result Value Ref Range    Glucose-Capillary 70 70 - 99 mg/dL   No results found.  Debility and encephalopathy due to anoxic BI. Hx of polysubstance abuse. Pt with worsening confusion today. Will follow along for functional progress. At this point is not appropriate for CIR. Will need to ability to participate and carry over with therapies. Rehab Admissions Coordinator to follow up.  Thanks,  Meredith Staggers, MD, Mellody Drown  I have personally performed a face to face diagnostic evaluation of this patient. Additionally, I have reviewed and concur with the physician assistant's documentation above.   Lavon Paganini Angiulli, PA-C 06/04/2018

## 2018-06-04 NOTE — Progress Notes (Signed)
OT Cancellation Note  Patient Details Name: Howard Thompson MRN: [id number] DOB: 1966/02/04   Cancelled Treatment:    Reason Eval/Treat Not Completed: Medical issues which prohibited therapy.  Pt with decreased mental status and inability to actively participate today.  Will reattempt.  [facility], OTR/L [phone]   Lucille Passy M 06/04/2018, 2:48 PM

## 2018-06-04 NOTE — Progress Notes (Signed)
Pt more restless, hallucinating and with hypoxia. CXR and ABG ordered. Pulmonary consulted.  Leisa Lenz [facility]

## 2018-06-05 LAB — VITAMIN B12: Vitamin B-12: 2144 pg/mL — ABNORMAL HIGH (ref 180–914)

## 2018-06-05 LAB — RENAL FUNCTION PANEL
ALBUMIN: 2.3 g/dL — AB (ref 3.5–5.0)
ANION GAP: 13 (ref 5–15)
BUN: 41 mg/dL — ABNORMAL HIGH (ref 6–20)
CO2: 27 mmol/L (ref 22–32)
Calcium: 8.2 mg/dL — ABNORMAL LOW (ref 8.9–10.3)
Chloride: 96 mmol/L — ABNORMAL LOW (ref 98–111)
Creatinine, Ser: 5.45 mg/dL — ABNORMAL HIGH (ref 0.61–1.24)
GFR calc non Af Amer: 11 mL/min — ABNORMAL LOW (ref >60)
GFR, EST AFRICAN AMERICAN: 13 mL/min — AB (ref >60)
Glucose, Bld: 83 mg/dL (ref 70–99)
PHOSPHORUS: 6.8 mg/dL — AB (ref 2.5–4.6)
POTASSIUM: 4.9 mmol/L (ref 3.5–5.1)
Sodium: 136 mmol/L (ref 135–145)

## 2018-06-05 LAB — BASIC METABOLIC PANEL
ANION GAP: 15 (ref 5–15)
BUN: 59 mg/dL — ABNORMAL HIGH (ref 6–20)
CALCIUM: 8.2 mg/dL — AB (ref 8.9–10.3)
CO2: 25 mmol/L (ref 22–32)
Chloride: 99 mmol/L (ref 98–111)
Creatinine, Ser: 6.91 mg/dL — ABNORMAL HIGH (ref 0.61–1.24)
GFR, EST AFRICAN AMERICAN: 10 mL/min — AB (ref >60)
GFR, EST NON AFRICAN AMERICAN: 8 mL/min — AB (ref >60)
Glucose, Bld: 92 mg/dL (ref 70–99)
Potassium: 5.1 mmol/L (ref 3.5–5.1)
SODIUM: 139 mmol/L (ref 135–145)

## 2018-06-05 LAB — GLUCOSE, CAPILLARY
GLUCOSE-CAPILLARY: 87 mg/dL (ref 70–99)
GLUCOSE-CAPILLARY: 98 mg/dL (ref 70–99)
Glucose-Capillary: 108 mg/dL — ABNORMAL HIGH (ref 70–99)
Glucose-Capillary: 68 mg/dL — ABNORMAL LOW (ref 70–99)
Glucose-Capillary: 96 mg/dL (ref 70–99)
Glucose-Capillary: 96 mg/dL (ref 70–99)

## 2018-06-05 LAB — CBC
HCT: 22.1 % — ABNORMAL LOW (ref 39.0–52.0)
Hemoglobin: 7.1 g/dL — ABNORMAL LOW (ref 13.0–17.0)
MCH: 28.3 pg (ref 26.0–34.0)
MCHC: 32.1 g/dL (ref 30.0–36.0)
MCV: 88 fL (ref 78.0–100.0)
PLATELETS: 232 10*3/uL (ref 150–400)
RBC: 2.51 MIL/uL — ABNORMAL LOW (ref 4.22–5.81)
RDW: 21.7 % — AB (ref 11.5–15.5)
WBC: 11.7 10*3/uL — AB (ref 4.0–10.5)

## 2018-06-05 LAB — IRON AND TIBC
IRON: 17 ug/dL — AB (ref 45–182)
SATURATION RATIOS: 5 % — AB (ref 17.9–39.5)
TIBC: 321 ug/dL (ref 250–450)
UIBC: 304 ug/dL

## 2018-06-05 LAB — HEPATIC FUNCTION PANEL
ALT: 8 U/L (ref 0–44)
AST: 34 U/L (ref 15–41)
Albumin: 2.3 g/dL — ABNORMAL LOW (ref 3.5–5.0)
Alkaline Phosphatase: 127 U/L — ABNORMAL HIGH (ref 38–126)
BILIRUBIN DIRECT: 0.4 mg/dL — AB (ref 0.0–0.2)
BILIRUBIN TOTAL: 1.4 mg/dL — AB (ref 0.3–1.2)
Indirect Bilirubin: 1 mg/dL — ABNORMAL HIGH (ref 0.3–0.9)
Total Protein: 5.6 g/dL — ABNORMAL LOW (ref 6.5–8.1)

## 2018-06-05 LAB — PHOSPHORUS: Phosphorus: 6.8 mg/dL — ABNORMAL HIGH (ref 2.5–4.6)

## 2018-06-05 LAB — MAGNESIUM: MAGNESIUM: 2.3 mg/dL (ref 1.7–2.4)

## 2018-06-05 LAB — PROCALCITONIN: PROCALCITONIN: 4.85 ng/mL

## 2018-06-05 MED ORDER — PRO-STAT SUGAR FREE PO LIQD
30.0000 mL | Freq: Two times a day (BID) | ORAL | Status: DC
  Administered 2018-06-05 – 2018-06-08 (×7): 30 mL
  Filled 2018-06-05 (×7): qty 30

## 2018-06-05 MED ORDER — FAMOTIDINE 40 MG/5ML PO SUSR
20.0000 mg | Freq: Every day | ORAL | Status: DC
Start: 2018-06-06 — End: 2018-06-09
  Administered 2018-06-06 – 2018-06-08 (×3): 20 mg
  Filled 2018-06-05 (×4): qty 2.5

## 2018-06-05 MED ORDER — QUETIAPINE FUMARATE 25 MG PO TABS
50.0000 mg | ORAL_TABLET | Freq: Two times a day (BID) | ORAL | Status: DC
  Administered 2018-06-05 – 2018-06-21 (×33): 50 mg via ORAL
  Filled 2018-06-05: qty 1
  Filled 2018-06-05 (×4): qty 2
  Filled 2018-06-05 (×9): qty 1
  Filled 2018-06-05 (×3): qty 2
  Filled 2018-06-05 (×5): qty 1
  Filled 2018-06-05 (×3): qty 2
  Filled 2018-06-05: qty 1
  Filled 2018-06-05: qty 2
  Filled 2018-06-05 (×3): qty 1
  Filled 2018-06-05 (×3): qty 2
  Filled 2018-06-05: qty 1

## 2018-06-05 MED ORDER — ASPIRIN 81 MG PO CHEW
81.0000 mg | CHEWABLE_TABLET | Freq: Every day | ORAL | Status: DC
  Administered 2018-06-05 – 2018-06-11 (×7): 81 mg
  Filled 2018-06-05 (×6): qty 1

## 2018-06-05 MED ORDER — LABETALOL HCL 5 MG/ML IV SOLN: 20.0000 mg | INTRAVENOUS | Status: DC | PRN

## 2018-06-05 MED ORDER — NEPRO/CARBSTEADY PO LIQD
1000.0000 mL | ORAL | Status: DC
  Administered 2018-06-05 – 2018-06-07 (×5): 1000 mL
  Filled 2018-06-05 (×6): qty 1000

## 2018-06-05 MED ORDER — VITAMIN B-1 100 MG PO TABS
100.0000 mg | ORAL_TABLET | Freq: Every day | ORAL | Status: DC
Start: 2018-06-06 — End: 2018-06-08
  Administered 2018-06-05 – 2018-06-07 (×3): 100 mg
  Filled 2018-06-05 (×3): qty 1

## 2018-06-05 MED ORDER — DEXMEDETOMIDINE HCL IN NACL 400 MCG/100ML IV SOLN
0.4000 ug/kg/h | INTRAVENOUS | Status: DC
  Administered 2018-06-06: 0.8 ug/kg/h via INTRAVENOUS
  Administered 2018-06-06: 1 ug/kg/h via INTRAVENOUS
  Administered 2018-06-06: 1.2 ug/kg/h via INTRAVENOUS
  Administered 2018-06-06: 0.7 ug/kg/h via INTRAVENOUS
  Administered 2018-06-07 (×2): 1.2 ug/kg/h via INTRAVENOUS
  Administered 2018-06-07 – 2018-06-08 (×3): 0.9 ug/kg/h via INTRAVENOUS
  Administered 2018-06-08: 1 ug/kg/h via INTRAVENOUS
  Administered 2018-06-08: 0.9 ug/kg/h via INTRAVENOUS
  Administered 2018-06-09 (×3): 1 ug/kg/h via INTRAVENOUS
  Administered 2018-06-10: 0.6 ug/kg/h via INTRAVENOUS
  Administered 2018-06-10: 1 ug/kg/h via INTRAVENOUS
  Filled 2018-06-05 (×16): qty 100

## 2018-06-05 MED ORDER — DEXMEDETOMIDINE HCL IN NACL 200 MCG/50ML IV SOLN
0.4000 ug/kg/h | INTRAVENOUS | Status: DC
  Administered 2018-06-05: 0.4 ug/kg/h via INTRAVENOUS
  Administered 2018-06-05: 1 ug/kg/h via INTRAVENOUS
  Filled 2018-06-05 (×2): qty 50

## 2018-06-05 MED ORDER — METOPROLOL TARTRATE 25 MG PO TABS: 25.0000 mg | ORAL_TABLET | Freq: Two times a day (BID) | ORAL | Status: DC

## 2018-06-05 MED ORDER — JEVITY 1.2 CAL PO LIQD
1000.0000 mL | ORAL | Status: DC
  Administered 2018-06-05: 1000 mL
  Filled 2018-06-05 (×2): qty 1000

## 2018-06-05 MED ORDER — CARVEDILOL 6.25 MG PO TABS
6.2500 mg | ORAL_TABLET | Freq: Two times a day (BID) | ORAL | Status: DC
  Administered 2018-06-05 – 2018-06-10 (×10): 6.25 mg via NASOGASTRIC
  Filled 2018-06-05 (×11): qty 1

## 2018-06-05 MED ORDER — DEXTROSE 50 % IV SOLN
INTRAVENOUS | Status: AC
  Administered 2018-06-05: 50 mL
  Filled 2018-06-05: qty 50

## 2018-06-05 NOTE — Progress Notes (Signed)
Paged requesting rectal tube as pt may be at risk for MASD d/t frequent loose BMs.   [place]  Tynisha Ogan, RN

## 2018-06-05 NOTE — Progress Notes (Signed)
PROGRESS NOTE    Howard Thompson  ZLD:[id number] DOB: 04-10-1966 DOA: 05/22/2018 PCP: No primary care provider on file.  Outpatient Specialists:     Brief Narrative:  Patient is a 52 year old Caucasian male, with past medical history significant for coronary artery disease, myasthenia gravis, hypertension, cocaine abuse and alcohol abuse.  Patient is also stated to be undergoing stressful.  Apparently, patient has recently separated from the wife.  Patient was admitted on7/27/2019s/p unwitnessed out of hospital cardiac arrest, VF, unknown downtime. Per the electronic medical record the patient received 5 defibrillations and given 300 mg amiodarone. Upon arrival the patient was found to have a STEMI and was taken to the Cath Lab where a DES was placed and a 100% LAD lesion. Post catheter patient was taken for therapeutic hypothermia at the intensive care unit. Per the initial critical care documentation there was concern for posturing. EKGs initially with a prolonged QT at 560, now improved.  Hospital course is significant for acute kidney injury, severe, anuric and hemodialysis dependent for now.  Etiology of the acute kidney injury is thought to be contrast induced nephropathy and hypotension.  Patient was initially on CRRT, been on on conventional hemodialysis.  Patient remains lethargic and encephalopathic.  06/03/2018: Patient underwent hemodialysis today.  Patient is more awake today and communicative.  06/05/2018: Concerns the patient's delirium is getting worse.  Neurology input is appreciated.  Precedex to be started, and benzodiazepines gradually weaned off.  MRI brain when feasible.  We will continue to monitor potassium closely.  Hemodialysis as per nephrology team.  Assessment & Plan:   Active Problems:   ACS (acute coronary syndrome) (HCC)   ST elevation myocardial infarction (STEMI) (HCC)   Ventricular fibrillation (HCC)   Cardiac arrest (HCC)   Shock ([place])   Elevated LFTs   Acute renal failure (HCC)   History of ETT   Thrombocytopenia (HCC)   Substance abuse (HCC)   Cocaine abuse ([place])   Encephalopathy acute   AKI (acute kidney injury) ([place])   Ischemic cardiomyopathy   Acute respiratory failure (HCC)   Status post cardiac (V. fib) arrest/STEMI status post PCI: Stable Cardiology team is managing. Patient is on aspirin, Coreg, Crestor and ticagrelor  Will defer management to the cardiology team.  Acute kidney injury, severe, anuric, hemodialysis dependent for now: This is likely secondary to hypotension and contrast-induced nephropathy. Renal prognosis is guarded. Continue hemodialysis as per nephrology team. Monitor potassium closely.   Hypertension: Continue to optimize. Last blood pressure was 166/99.  Rule of agitation and elevated blood pressure unclear.   Further management depend on hospital course.  Encephalopathy following V. fib arrest: Prognosis remains guarded. Neurology input is appreciated. Current worsening of delirium. Precedex started. Benzodiazepines to be gradually weaned off MRI of the brain when feasible Severe renal impairment, with associated uremia may be affecting patient's mental status as well.  Hospitalist team will continue to follow patient with you.  DVT prophylaxis: SCD Code Status: Partial Family Communication: 2 sisters from [place] by the bedside Disposition Plan: Will depend on hospital course   Procedures:   Cardiac cath and PCI  Hemodialysis  Antimicrobials:   None   Subjective: Patient remains encephalopathic/agitated No much history from patient today.      Objective: Vitals:   06/05/18 0923 06/05/18 1109 06/05/18 1600 06/05/18 1626  BP: (!) 156/107 (!) 154/88  (!) 166/99  Pulse: 99 98    Resp:  18    Temp:  98.6 F (37 C)  99  F (37.2 C)  TempSrc:  Axillary  Axillary  SpO2:  100% 100%   Weight:      Height:        Intake/Output Summary (Last 24 hours) at 06/05/2018  1753 Last data filed at 06/05/2018 1700 Gross per 24 hour  Intake 182.5 ml  Output 1996 ml  Net -1813.5 ml   Filed Weights   06/04/18 1545 06/04/18 1943 06/05/18 0600  Weight: 61.8 kg 59.8 kg 59.6 kg    Examination:  General exam: Remains encephalopathic/agitated. Respiratory system: Clear to auscultation.   Cardiovascular system: S1 & S2 heard,  Gastrointestinal system: Abdomen is nondistended, soft and nontender. No organomegaly or masses felt. Normal bowel sounds heard. Central nervous system: Encephalopathic. Extremities: No leg edema.  Data Reviewed: I have personally reviewed following labs and imaging studies  CBC: Recent Labs  Lab 06/02/18 2142 06/03/18 0759 06/03/18 2037 06/04/18 0335 06/04/18 0943 06/05/18 1226  WBC 11.2* 11.9* 10.1 12.6* 13.5* 11.7*  NEUTROABS 9.7* 10.6* 8.5* 10.9* 12.0*  --   HGB 6.8* 7.4* 7.6* 7.9* 8.0* 7.1*  HCT 20.8* 22.6* 23.3* 25.0* 24.7* 22.1*  MCV 86.0 85.3 85.3 87.1 86.1 88.0  PLT 141* 138* 165 176 209 458   Basic Metabolic Panel: Recent Labs  Lab 06/01/18 0251 06/02/18 0240  06/03/18 0759 06/04/18 0335 06/04/18 0943 06/05/18 0238 06/05/18 1226  NA 140 136   < > 137 138 138 136 139  K 5.5* 5.2*   < > 6.3* 6.0* 6.2* 4.9 5.1  CL 103 98   < > 99 96* 95* 96* 99  CO2 20* 24   < > 22 26 25 27 25   GLUCOSE 108* 98   < > 118* 90 88 83 92  BUN 119* 86*   < > 122* 66* 71* 41* 59*  CREATININE 8.07* 7.10*   < > 10.44* 7.59* 8.11* 5.45* 6.91*  CALCIUM 8.3* 7.8*   < > 8.1* 8.5* 8.5* 8.2* 8.2*  MG 3.6* 3.0*  --  3.7* 2.6* 2.6* 2.3  --   PHOS 6.8* 6.3*  --  9.5* 7.5*  6.9*  --  6.8*  6.8*  --    < > = values in this interval not displayed.   GFR: Estimated Creatinine Clearance: 10.7 mL/min (A) (by C-G formula based on SCr of 6.91 mg/dL (H)). Liver Function Tests: Recent Labs  Lab 06/02/18 0240 06/03/18 0759 06/04/18 0335 06/04/18 0943 06/05/18 0238  AST 45* 37 40 36 34  ALT 9 6 7 7 8   ALKPHOS 138* 126 134* 137* 127*    BILITOT 1.2 1.2 1.3* 1.4* 1.4*  PROT 5.4* 5.5* 5.9* 6.1* 5.6*  ALBUMIN 2.4* 2.5* 2.7*  2.6* 2.6* 2.3*  2.3*   No results for input(s): LIPASE, AMYLASE in the last 168 hours. Recent Labs  Lab 06/04/18 0939  AMMONIA 22   Coagulation Profile: Recent Labs  Lab 05/31/18 1003 06/01/18 0251 06/01/18 2231 06/04/18 1044  INR 1.10 1.14 1.18 1.23   Cardiac Enzymes: No results for input(s): CKTOTAL, CKMB, CKMBINDEX, TROPONINI in the last 168 hours. BNP (last 3 results) No results for input(s): PROBNP in the last 8760 hours. HbA1C: No results for input(s): HGBA1C in the last 72 hours. CBG: Recent Labs  Lab 06/04/18 2345 06/05/18 0423 06/05/18 0926 06/05/18 1225 06/05/18 1648  GLUCAP 87 108* 68* 96 98   Lipid Profile: No results for input(s): CHOL, HDL, LDLCALC, TRIG, CHOLHDL, LDLDIRECT in the last 72 hours. Thyroid Function Tests: No results for input(s):  TSH, T4TOTAL, FREET4, T3FREE, THYROIDAB in the last 72 hours. Anemia Panel: Recent Labs    06/03/18 1331  FERRITIN 121  TIBC 361  IRON 55   Urine analysis:    Component Value Date/Time   COLORURINE YELLOW 05/22/2018 2024   APPEARANCEUR CLEAR 05/22/2018 2024   LABSPEC >1.046 (H) 05/22/2018 2024   PHURINE 5.0 05/22/2018 2024   GLUCOSEU NEGATIVE 05/22/2018 2024   HGBUR LARGE (A) 05/22/2018 2024   BILIRUBINUR NEGATIVE 05/22/2018 2024   KETONESUR NEGATIVE 05/22/2018 2024   PROTEINUR 30 (A) 05/22/2018 2024   NITRITE NEGATIVE 05/22/2018 2024   LEUKOCYTESUR NEGATIVE 05/22/2018 2024   Sepsis Labs: @LABRCNTIP (procalcitonin:4,lacticidven:4)  ) Recent Results (from the past 240 hour(s))  Culture, blood (Routine X 2) w Reflex to ID Panel     Status: None (Preliminary result)   Collection Time: 06/04/18 12:44 PM  Result Value Ref Range Status   Specimen Description BLOOD RIGHT FOREARM  Final   Special Requests   Final    BOTTLES DRAWN AEROBIC AND ANAEROBIC Blood Culture results may not be optimal due to an excessive  volume of blood received in culture bottles   Culture   Final    NO GROWTH < 24 HOURS Performed at [facility], [place] [address], [place], [place] [address]    Report Status PENDING  Incomplete  Culture, blood (Routine X 2) w Reflex to ID Panel     Status: None (Preliminary result)   Collection Time: 06/04/18  5:00 PM  Result Value Ref Range Status   Specimen Description BLOOD HEMODIALYSIS CATHETER  Final   Special Requests   Final    BOTTLES DRAWN AEROBIC AND ANAEROBIC Blood Culture adequate volume   Culture   Final    NO GROWTH < 24 HOURS Performed at [facility], [place] [address], [place], [place] [address]    Report Status PENDING  Incomplete         Radiology Studies: Dg Chest Port 1 View  Result Date: 06/04/2018 CLINICAL DATA:  Cough. EXAM: PORTABLE CHEST 1 VIEW COMPARISON:  Radiograph of May 29, 2018. FINDINGS: Stable cardiomediastinal silhouette. Interval placement of right internal jugular dialysis catheter with distal tip in expected position of cavoatrial junction. No pneumothorax is noted. Slightly improved bilateral lung opacities are noted, left greater than right, most consistent with pneumonia or possibly edema. No significant pleural effusion is noted. Bony thorax is unremarkable. IMPRESSION: Interval placement of right internal jugular dialysis catheter. Slightly improved bilateral pneumonia or edema is noted, left greater than right. Electronically Signed   By: Marijo Conception, M.D.   On: 06/04/2018 12:14        Scheduled Meds: . [START ON 06/06/2018] aspirin  81 mg Per Tube Daily  . B-complex with vitamin C  1 tablet Per Tube Daily  . carvedilol  6.25 mg Per NG tube BID WC  . Chlorhexidine Gluconate Cloth  6 each Topical Daily  . Chlorhexidine Gluconate Cloth  6 each Topical Q0600  . darbepoetin (ARANESP) injection - DIALYSIS  100 mcg Intravenous Q Thu-HD  . [START ON 06/06/2018] famotidine  20 mg Per Tube Daily  . feeding supplement (NEPRO CARB  STEADY)  1,000 mL Per Tube Q24H  . feeding supplement (PRO-STAT SUGAR FREE 64)  30 mL Per Tube BID  . folic acid  1 mg Oral Daily  . insulin aspart  0-9 Units Subcutaneous Q4H  . LORazepam  2 mg Oral TID   Followed by  . [START  ON 06/07/2018] LORazepam  1 mg Oral TID   Followed by  . [START ON 06/10/2018] LORazepam  0.5 mg Oral TID  . mouth rinse  15 mL Mouth Rinse BID  . multivitamin with minerals  1 tablet Oral Daily  . QUEtiapine  50 mg Oral BID  . rosuvastatin  10 mg Oral q1800  . sodium chloride flush  3 mL Intravenous Q12H  . [START ON 06/06/2018] thiamine  100 mg Per Tube Daily  . ticagrelor  90 mg Per Tube BID  . zinc sulfate  220 mg Oral Daily   Continuous Infusions: . sodium chloride 500 mL (05/27/18 1647)     LOS: 14 days    Time spent: 25 minutes    Dana Allan, MD  [facility] Pager #: [phone] 7PM-7AM contact night coverage as above

## 2018-06-05 NOTE — Progress Notes (Signed)
Report taken from [place], Therapist, sports from [employer]. Awaiting patient arrival.

## 2018-06-05 NOTE — Progress Notes (Signed)
S: Remains agitated and delirious O:BP (!) 156/107   Pulse 99   Temp 98.4 F (36.9 C) (Axillary)   Resp (!) 30   Ht 5\' 10"  (1.778 m)   Wt 59.6 kg   SpO2 100%   BMI 18.85 kg/m   Intake/Output Summary (Last 24 hours) at 06/05/2018 1101 Last data filed at 06/04/2018 1943 Gross per 24 hour  Intake 0 ml  Output 1996 ml  Net -1996 ml   Intake/Output: I/O last 3 completed shifts: In: 0  Out: 1996 [Other:1996]  Intake/Output this shift:  No intake/output data recorded. Weight change: -3.2 kg Gen: agitated CVS: no rub Resp: scattered rhonchi Abd: +BS, soft, NT Ext: no edema  Recent Labs  Lab 05/30/18 0357 05/31/18 0409  06/01/18 0251 06/02/18 0240 06/02/18 1616 06/03/18 0759 06/04/18 0335 06/04/18 0943 06/05/18 0238  NA 138 141   < > 140 136 138 137 138 138 136  K 4.7 4.8   < > 5.5* 5.2* 5.7* 6.3* 6.0* 6.2* 4.9  CL 101 105   < > 103 98 101 99 96* 95* 96*  CO2 26 24   < > 20* 24 23 22 26 25 27   GLUCOSE 87 104*   < > 108* 98 84 118* 90 88 83  BUN 38* 77*   < > 119* 86* 104* 122* 66* 71* 41*  CREATININE 2.74* 5.60*   < > 8.07* 7.10* 8.62* 10.44* 7.59* 8.11* 5.45*  ALBUMIN 2.4* 2.4*  --  2.4* 2.4*  --  2.5* 2.7*  2.6* 2.6* 2.3*  2.3*  CALCIUM 8.2* 8.1*   < > 8.3* 7.8* 8.1* 8.1* 8.5* 8.5* 8.2*  PHOS 2.5 4.3  --  6.8* 6.3*  --  9.5* 7.5*  6.9*  --  6.8*  6.8*  AST 130* 71*  --  52* 45*  --  37 40 36 34  ALT 577* 115*  --  26 9  --  6 7 7 8    < > = values in this interval not displayed.   Liver Function Tests: Recent Labs  Lab 06/04/18 0335 06/04/18 0943 06/05/18 0238  AST 40 36 34  ALT 7 7 8   ALKPHOS 134* 137* 127*  BILITOT 1.3* 1.4* 1.4*  PROT 5.9* 6.1* 5.6*  ALBUMIN 2.7*  2.6* 2.6* 2.3*  2.3*   No results for input(s): LIPASE, AMYLASE in the last 168 hours. Recent Labs  Lab 06/04/18 0939  AMMONIA 22   CBC: Recent Labs  Lab 06/02/18 2142 06/03/18 0759 06/03/18 2037 06/04/18 0335 06/04/18 0943  WBC 11.2* 11.9* 10.1 12.6* 13.5*  NEUTROABS 9.7*  10.6* 8.5* 10.9* 12.0*  HGB 6.8* 7.4* 7.6* 7.9* 8.0*  HCT 20.8* 22.6* 23.3* 25.0* 24.7*  MCV 86.0 85.3 85.3 87.1 86.1  PLT 141* 138* 165 176 209   Cardiac Enzymes: Recent Labs  Lab 05/29/18 1445  TROPONINI 0.66*   CBG: Recent Labs  Lab 06/04/18 1302 06/04/18 2108 06/04/18 2345 06/05/18 0423 06/05/18 0926  GLUCAP 93 78 87 108* 68*    Iron Studies:  Recent Labs    06/03/18 1331  IRON 55  TIBC 361  FERRITIN 121   Studies/Results: Dg Chest Port 1 View  Result Date: 06/04/2018 CLINICAL DATA:  Cough. EXAM: PORTABLE CHEST 1 VIEW COMPARISON:  Radiograph of May 29, 2018. FINDINGS: Stable cardiomediastinal silhouette. Interval placement of right internal jugular dialysis catheter with distal tip in expected position of cavoatrial junction. No pneumothorax is noted. Slightly improved bilateral lung opacities are noted, left  greater than right, most consistent with pneumonia or possibly edema. No significant pleural effusion is noted. Bony thorax is unremarkable. IMPRESSION: Interval placement of right internal jugular dialysis catheter. Slightly improved bilateral pneumonia or edema is noted, left greater than right. Electronically Signed   By: Marijo Conception, M.D.   On: 06/04/2018 12:14   . aspirin  81 mg Oral Daily  . B-complex with vitamin C  1 tablet Per Tube Daily  . Chlorhexidine Gluconate Cloth  6 each Topical Daily  . Chlorhexidine Gluconate Cloth  6 each Topical Q0600  . darbepoetin (ARANESP) injection - DIALYSIS  100 mcg Intravenous Q Thu-HD  . famotidine  20 mg Oral Daily  . folic acid  1 mg Oral Daily  . insulin aspart  0-9 Units Subcutaneous Q4H  . LORazepam  2 mg Oral TID   Followed by  . [START ON 06/07/2018] LORazepam  1 mg Oral TID   Followed by  . [START ON 06/10/2018] LORazepam  0.5 mg Oral TID  . mouth rinse  15 mL Mouth Rinse BID  . multivitamin with minerals  1 tablet Oral Daily  . QUEtiapine  50 mg Oral BID  . rosuvastatin  10 mg Oral q1800  . sodium  chloride flush  3 mL Intravenous Q12H  . thiamine  100 mg Oral Daily  . ticagrelor  90 mg Per Tube BID  . zinc sulfate  220 mg Oral Daily    BMET    Component Value Date/Time   NA 136 06/05/2018 0238   K 4.9 06/05/2018 0238   CL 96 (L) 06/05/2018 0238   CO2 27 06/05/2018 0238   GLUCOSE 83 06/05/2018 0238   BUN 41 (H) 06/05/2018 0238   CREATININE 5.45 (H) 06/05/2018 0238   CALCIUM 8.2 (L) 06/05/2018 0238   GFRNONAA 11 (L) 06/05/2018 0238   GFRAA 13 (L) 06/05/2018 0238   CBC    Component Value Date/Time   WBC 13.5 (H) 06/04/2018 0943   RBC 2.87 (L) 06/04/2018 0943   HGB 8.0 (L) 06/04/2018 0943   HCT 24.7 (L) 06/04/2018 0943   PLT 209 06/04/2018 0943   MCV 86.1 06/04/2018 0943   MCH 27.9 06/04/2018 0943   MCHC 32.4 06/04/2018 0943   RDW 21.6 (H) 06/04/2018 0943   LYMPHSABS 0.4 (L) 06/04/2018 0943   MONOABS 1.1 (H) 06/04/2018 0943   EOSABS 0.0 06/04/2018 0943   BASOSABS 0.0 06/04/2018 0943    Assessment/Plan:  1. 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  Has been HD dependent and was on CRRT until 05/30/18 and has since been tolerating intermittent HD.  Last treatment was yesterday.  Continue with HD qTTS for now and follow for any signs of renal recovery.  Remains anuric.  Of schedule due to hyperkalemia.  Plan for HD on Monday.  2. AMS- per family and staff he was more oriented and alert Thursday but since later that night he has been agitated and more confused.  W/u per primary svc   Pt was hypoxic and CXR with ? Bilateral Pneumonia L>R. Consider repeat ct scan and continue with oxygen. 3. Hyperkalemia- unclear etiology as he had HD without issues.  s/p consecutive HD on 8/8 and 8/9/with improvement of potassium with low K bath and increased DFR and longer HD.  Continue to follow. 4. ABLA and anemia of critical illness- bled from HD cath site which has stopped, transfuse prn and cont with ESA on HD 5. Cardiac arrest/CAD/ICMP- s/p PCI/DES 6. Polysubstance  abuse- +  cocaine 7. Anoxic encephalopathy 8. HTN- cont with meds 9. Moderate protein malnutrition.  Donetta Potts, MD [employer] [phone]

## 2018-06-05 NOTE — Progress Notes (Signed)
Progress Note  Patient Name: Howard Thompson Date of Encounter: 06/05/2018  Primary Cardiologist: No primary care provider on file.  Subjective    Patient very delirious and agitated.  Screaming foul language.  Neuro has been consulted.  Inpatient Medications    Scheduled Meds: . aspirin  81 mg Oral Daily  . B-complex with vitamin C  1 tablet Per Tube Daily  . Chlorhexidine Gluconate Cloth  6 each Topical Daily  . Chlorhexidine Gluconate Cloth  6 each Topical Q0600  . darbepoetin (ARANESP) injection - DIALYSIS  100 mcg Intravenous Q Thu-HD  . famotidine  20 mg Oral Daily  . folic acid  1 mg Oral Daily  . insulin aspart  0-9 Units Subcutaneous Q4H  . LORazepam  2 mg Oral TID   Followed by  . [START ON 06/07/2018] LORazepam  1 mg Oral TID   Followed by  . [START ON 06/10/2018] LORazepam  0.5 mg Oral TID  . mouth rinse  15 mL Mouth Rinse BID  . multivitamin with minerals  1 tablet Oral Daily  . QUEtiapine  50 mg Oral BID  . rosuvastatin  10 mg Oral q1800  . sodium chloride flush  3 mL Intravenous Q12H  . thiamine  100 mg Oral Daily  . ticagrelor  90 mg Per Tube BID  . zinc sulfate  220 mg Oral Daily   Continuous Infusions: . sodium chloride 500 mL (05/27/18 1647)  . feeding supplement (JEVITY 1.2 CAL)     PRN Meds: sodium chloride, albuterol, docusate, Gerhardt's butt cream, hydrALAZINE, labetalol, LORazepam, nitroGLYCERIN, sodium chloride flush   Vital Signs    Vitals:   06/05/18 0600 06/05/18 0733 06/05/18 0923 06/05/18 1109  BP:  (!) 150/99 (!) 156/107 (!) 154/88  Pulse:  97 99 98  Resp:  (!) 30  18  Temp:  98.4 F (36.9 C)  98.6 F (37 C)  TempSrc:  Axillary  Axillary  SpO2:  100%  100%  Weight: 59.6 kg     Height:        Intake/Output Summary (Last 24 hours) at 06/05/2018 1130 Last data filed at 06/04/2018 1943 Gross per 24 hour  Intake 0 ml  Output 1996 ml  Net -1996 ml   Filed Weights   06/04/18 1545 06/04/18 1943 06/05/18 0600  Weight: 61.8 kg  59.8 kg 59.6 kg    Telemetry    Sinus tachycardia - Personally Reviewed  ECG    ST with no acute ST change- Personally Reviewed  Physical Exam   GEN: very agitated and delirious writhing in the bed and screaming HEENT: Normal NECK: No JVD; No carotid bruits LYMPHATICS: No lymphadenopathy CARDIAC:RRR, no rubs, gallops.  2/6 SM at LLSB to apex RESPIRATORY:  Clear to auscultation without rales, wheezing or rhonchi anteriorly ABDOMEN: Soft, non-tender, non-distended MUSCULOSKELETAL:  No edema; No deformity  SKIN: Warm and dry NEUROLOGIC:  Cannot assess.  Acutely agitated PSYCHIATRIC:  Acutely agitated   Labs    Chemistry Recent Labs  Lab 06/04/18 0335 06/04/18 0943 06/05/18 0238  NA 138 138 136  K 6.0* 6.2* 4.9  CL 96* 95* 96*  CO2 26 25 27   GLUCOSE 90 88 83  BUN 66* 71* 41*  CREATININE 7.59* 8.11* 5.45*  CALCIUM 8.5* 8.5* 8.2*  PROT 5.9* 6.1* 5.6*  ALBUMIN 2.7*  2.6* 2.6* 2.3*  2.3*  AST 40 36 34  ALT 7 7 8   ALKPHOS 134* 137* 127*  BILITOT 1.3* 1.4* 1.4*  GFRNONAA 7* 7*  11*  GFRAA 9* 8* 13*  ANIONGAP 16* 18* 13     Hematology Recent Labs  Lab 06/03/18 2037 06/04/18 0335 06/04/18 0943  WBC 10.1 12.6* 13.5*  RBC 2.73* 2.87* 2.87*  HGB 7.6* 7.9* 8.0*  HCT 23.3* 25.0* 24.7*  MCV 85.3 87.1 86.1  MCH 27.8 27.5 27.9  MCHC 32.6 31.6 32.4  RDW 21.2* 21.6* 21.6*  PLT 165 176 209    Cardiac Enzymes Recent Labs  Lab 05/29/18 1445  TROPONINI 0.66*   No results for input(s): TROPIPOC in the last 168 hours.   BNPNo results for input(s): BNP, PROBNP in the last 168 hours.   DDimer No results for input(s): DDIMER in the last 168 hours.    Radiology    Dg Chest Port 1 View  Result Date: 06/04/2018 CLINICAL DATA:  Cough. EXAM: PORTABLE CHEST 1 VIEW COMPARISON:  Radiograph of May 29, 2018. FINDINGS: Stable cardiomediastinal silhouette. Interval placement of right internal jugular dialysis catheter with distal tip in expected position of cavoatrial  junction. No pneumothorax is noted. Slightly improved bilateral lung opacities are noted, left greater than right, most consistent with pneumonia or possibly edema. No significant pleural effusion is noted. Bony thorax is unremarkable. IMPRESSION: Interval placement of right internal jugular dialysis catheter. Slightly improved bilateral pneumonia or edema is noted, left greater than right. Electronically Signed   By: Marijo Conception, M.D.   On: 06/04/2018 12:14    Cardiac Studies        Post-Intervention Diagram          ------------------------------------------------------------------- ECHO Study Conclusions  - Left ventricle: The cavity size was normal. Systolic function was severely reduced. The estimated ejection fraction was in the range of 25% to 30%. Severe diffuse hypokinesis with regional variations. There is akinesis of the apicalanterior and apical myocardium. There is akinesis of the midanteroseptal myocardium. There is severe hypokinesis of the inferoseptal myocardium. Features are consistent with a pseudonormal left ventricular filling pattern, with concomitant abnormal relaxation and increased filling pressure (grade 2 diastolic dysfunction). Doppler parameters are consistent with high ventricular filling pressure. - Aortic valve: A bicuspid morphology cannot be excluded; mildly thickened, mildly calcified leaflets. There was mild to moderate regurgitation. Regurgitation pressure half-time: 718 ms. - Mitral valve: There was mild to moderate regurgitation. Valve area by pressure half-time: 2.39 cm^2. - Right ventricle: Systolic function was moderately reduced. - Atrial septum: There was increased thickness of the septum, consistent with lipomatous hypertrophy. - Tricuspid valve: There was mild regurgitation. - Pulmonary arteries: PA peak pressure: 33 mm Hg (S). - Inferior vena cava: The vessel was dilated. The  respirophasic diameter changes were blunted (<50%), consistent with elevated central venous pressure. - Pericardium, extracardiac: A small, free-flowing pericardial effusion was identified circumferential to the heart. The fluid had no internal echoes.There was no evidence of hemodynamic compromise.  Impressions:  - severe LV dysfunction with diffuse HK but apica/anteroseptal and apical anterior AK. Recommend definity contrast study to rule out apical thrombus.  Patient Profile     52 y.o. male out of hospital cardiac arrest, prior history of MI several years ago, myasthenia gravis post thymus resection, anterior ST elevation post LAD stent. Extubated on 8/2.   Assessment & Plan    1. Cardiac arrest -Underwent cath with PCI/DES x1 to the pLAD on 05/22/18. -Continue  DAPT with ASA/Brilinta for 12 months.   -Currently not taking meds due to acute delirium -CCM as ordered NG tube  -ASA was held in the setting of  thrombocytopenia, but has since been resumed. Platelet count improved.  -Statin held 2/2 to elevated LFTs. LFTs have normalized.  -Continue Crestor 10 mg daily, ASA 81 mg and Brilinta 90 mg twice daily which will now be given per NG tube as he is not taking p.o. due to delirium  -BP and heart rate are elevated in part due to agitation.  Was getting Lopressor but stopped by CCM due to not able to take PO -in light of h/o cocaine use with change BB to carvedilol 6.25mg  BID per NGT  2.  ICM -EF noted at 25-30%.  -No room for ARB/ACEi given AKF -change lopressor to carvedilol due to h/o cocaine  3. Anoxic encephalopathy - EEG was abnormal but repeat now improving.  -Now with acute delirium and agitation -Neuro has been consulted  4. Acute renal failure: -Nephrology following.  -S/p placement of new dialysis catheter right subclavian.   -Creatinine was 8.11 and potassium 6.2 yesterday and received dialysis.  Creatinine 5.45 today and potassium 4.9.     5. Polysubstance abuse: + for cocaine on admission.   6. Acute blood loss anemia 2/2 to bleeding HD cath -Hgb dropped to 6, now s/p 2 units of PRBCs.  -Hgb up from 7.9 yesterday to 8 today.  -Ideally would like Hgb above 8 given coronary issues but need to balance with volume issues. Will monitor Hgb with dialysis.   7.  Systolic heart murmur at the apex -2D echo 05/23/2018 showed mild to moderate mitral regurgitation and mild to moderate aortic insufficiency  Patient has multiple noncardiac issues requiring multiple specialist.  I have spoken with Dr. Debbora Dus with CCM and he is agreed to take patient on to his service and we will continue to follow in consultation  Signed, Fransico Him, MD  06/05/2018, 11:30 AM    For questions or updates, please contact [place] Please consult [email or web address] for contact info under Cardiology/STEMI.

## 2018-06-05 NOTE — Progress Notes (Signed)
  Speech Language Pathology Treatment: Dysphagia  Patient Details Name: Howard Thompson MRN: [id number] DOB: 07/20/1966 Today's Date: 06/05/2018 Time: 1345-1401 SLP Time Calculation (min) (ACUTE ONLY): 16 min  Assessment / Plan / Recommendation Clinical Impression  ST follow up for PO readiness.  Per nurse patient is intermittently alert and oriented to self only.  Cortrak was placed this AM for nutrition.  Char review indicated that the patient has been afebrile but intake has been poor.  Chest xray from 06/04/2018 is showing slightly improved bilateral PNA or edema left greater then right.  The patient was presented with thin liquids via spoon, cup and pureed material.  Patient continues to present with oropharyngeal dysphagia that is further impacted by his current mentation.  Oral holding across all bolus types was noted with oral suctioning required for pureed material.  Strong cough response was seen for therapist driven cup sip of thin liquids.  Recommend that the patient remain NPO until mentation improves.  ST will continue to follow for PO readiness.   HPI HPI: Howard Thompson is a 52 y.o. male admitted on 7/27 s/p unwitnessed out of hospital cardiac arrest, VF, unknown downtime, past medical history of coronary artery disease, myasthenia gravis, hypertension.  Per wife, pt has had hx of dysphagia related to MG which is largely resolved.  Pt was intubated from 7/27 - 8/2.      SLP Plan  Goals updated       Recommendations  Diet recommendations: NPO Medication Administration: Via alternative means                Oral Care Recommendations: Oral care QID Follow up Recommendations: Skilled Nursing facility SLP Visit Diagnosis: Dysphagia, oropharyngeal phase (R13.12) Plan: Goals updated       [place], MA, CCC-SLP Acute Rehab SLP [phone] [name] 06/05/2018, 2:06 PM

## 2018-06-05 NOTE — Progress Notes (Signed)
Pulm/CCM  The patient continues to have agitated delirium. He was calmer and communicating better several days ago. His blood pressure is elevated.  T max was 99.1 He spits up his medications. He has not had good nutrition for several days now. Unsure if would cooperate with a feeding tube being placed.he had dialysis yesterday.    PAST MEDICAL HISTORY :  He  has no past medical history on file.  PAST SURGICAL HISTORY: He  has a past surgical history that includes LEFT HEART CATH AND CORONARY ANGIOGRAPHY (N/A, 05/22/2018); CORONARY STENT INTERVENTION (N/A, 05/22/2018); IR US Guide Vasc Access Right (05/31/2018); and IR Fluoro Guide CV Line Right (05/31/2018).  No Known Allergies  No current facility-administered medications on file prior to encounter.    Current Outpatient Medications on File Prior to Encounter  Medication Sig  . nitroGLYCERIN (NITROSTAT) 0.4 MG SL tablet Place 0.4 mg under the tongue every 5 (five) minutes as needed for chest pain.    FAMILY HISTORY:  His family history is not on file.  SOCIAL HISTORY: He  reports that he has been smoking cigarettes. He has been smoking about 1.00 pack per day. He uses smokeless tobacco.    VITAL SIGNS: BP (!) 156/107   Pulse 99   Temp 98.4 F (36.9 C) (Axillary)   Resp (!) 30   Ht 5\' 10"  (1.778 m)   Wt 59.6 kg   SpO2 100%   BMI 18.85 kg/m     INTAKE / OUTPUT: I/O last 3 completed shifts: In: 0  Out: 1996 [Other:1996]  PHYSICAL EXAMINATION: General:  PATIENT IS AGITATED. HE IS IS NOT TAKING MEDS.he ramble incomprehensibly. Neuro:  Moves all extr., he lies in bed with eyes closed. Cannot focus his attention to follow commands HEENT:  [place]/AT Cardiovascular:NSR, [id number]   Lungs:  Bilateral BS Abdomen: sift, BS, non-tender Extr: [place]/c/e Skin: warma nd dry  LABS:  BMET Recent Labs  Lab 06/04/18 0335 06/04/18 0943 06/05/18 0238  NA 138 138 136  K 6.0* 6.2* 4.9  CL 96* 95* 96*  CO2 26 25 27   BUN 66* 71* 41*   CREATININE 7.59* 8.11* 5.45*  GLUCOSE 90 88 83    Electrolytes Recent Labs  Lab 06/03/18 0759 06/04/18 0335 06/04/18 0943 06/05/18 0238  CALCIUM 8.1* 8.5* 8.5* 8.2*  MG 3.7* 2.6* 2.6* 2.3  PHOS 9.5* 7.5*  6.9*  --  6.8*  6.8*    CBC Recent Labs  Lab 06/03/18 2037 06/04/18 0335 06/04/18 0943  WBC 10.1 12.6* 13.5*  HGB 7.6* 7.9* 8.0*  HCT 23.3* 25.0* 24.7*  PLT 165 176 209    Coag's Recent Labs  Lab 06/01/18 0251 06/01/18 2231 06/04/18 1044  APTT  --   --  33  INR 1.14 1.18 1.23    Sepsis Markers Recent Labs  Lab 05/29/18 2332 06/04/18 1047 06/04/18 1228 06/05/18 0238  LATICACIDVEN 1.3 1.0  --   --   PROCALCITON  --   --  3.00 4.85    ABG Recent Labs  Lab 06/01/18 0505 06/04/18 0924  PHART 7.348* 7.515*  PCO2ART 35.1 33.7  PO2ART 102.0 71.6*    Liver Enzymes Recent Labs  Lab 06/04/18 0335 06/04/18 0943 06/05/18 0238  AST 40 36 34  ALT 7 7 8   ALKPHOS 134* 137* 127*  BILITOT 1.3* 1.4* 1.4*  ALBUMIN 2.7*  2.6* 2.6* 2.3*  2.3*    Cardiac Enzymes Recent Labs  Lab 05/29/18 1445  TROPONINI 0.66*    Glucose Recent Labs  Lab 06/04/18 1221 06/04/18 1302 06/04/18 2108 06/04/18 2345 06/05/18 0423 06/05/18 0926  GLUCAP 103* 93 78 87 108* 68*    Imaging No results found.   DISCUSSION: Patient with agitated delirium unclear etiology. Unclear what is driving his delirium. He had been better until several days ago. He is afebbrile. Has been in the hospital since 7/27 Had back to back dialyses without improvement in his mentqal status  ASSESSMENT / PLAN:  PULMONARY  respiratory status apopears to be ok but he appears to get more breathless when he is more agitated. CXR of 8/9 showed some clearing of bialterla lower lobe infitrates.  CARDIOVASCULAR S/P MI aband cardiac arrest on 7/27. Had DES to LAD lesion.  RENAL Patient with Acute kidney failure since his admission. He is getting tiw  doialysess  GASTROINTESTINAL/Nutrition Very poor . Patient does not appear to have desire to eat ans spitting out juice and meds. THE PATIENT WILL NEED A FEEDING TUBE. HAS NOT HAD DECENT NUTRTIONFOR SEVERAL DAYS.     HEMATOLOGIC  low grade anemia with Hb of 7.9    INFECTIOUS Has been afebbrile. Not on any antimicrobialas presently his WBC is running 10-12 K. Had an elevated procal on 8/9 to 4.85. Most recent blood culutres of 8/9 negative thus far.    NEUROLOGIC  Agitated delirium.Difficult to know what is driving this. atient does have a history of recreational cocaine use and alcoholism. CT head negative on admission. I HAVE ASKED NEUROLOGY TO SEE HIM IN CONSULTATION.UNCLEAR HOW MUCH OF THIS  Syndrome is a consequence oof the intiall arrest. The fact that he had been doing better and than got worse again goes counter to that.    Micheal Likens MD Pulmonary and [place] Pager: [phone]  06/05/2018, 10:16 AM

## 2018-06-05 NOTE — H&P (Addendum)
Neurology Consultation  Reason for Consult: Delirium  Referring Physician: Dr. Debbora Dus   CC: Delirium, AMS, Speech abnormality   History is obtained from: Chart review/ Nursing communication   HPI: Fredick Schlosser is a 52 y.o. male with a PMH- Lupus, Dyslipidemia, Tobacco abuse, Alcohol abuse< Cocaine and substance abuse, myasthenia gravis s/p thymus removal presented on 05/22/18 after being found down in the field as an unwitnessed arrest with CPR being initiated by a neighbor. Rhythm identified on initial check was vfib, subsequently patient was shocked 4x's, given amio, with ROSC achieved on the 5th shock. Patient was intubated on admission. CT brain was negative for acute findings of intracranial abnormalities. Lab findings revealed toxicology screen to be positive for cocaine and benzos, and elevation of troponin and patient was found to have a STEMI. This sequelae led to patient undergoing cardiac cath with Ost LAD to Prox LAD lesion is 100% stenosed; Status post drug-eluting stent to the LAD and placed on Brillinta. Patient was later transferred to ICU for therapeutic hypothermia for 24hrs. Rewarming phase took place on  Hospital course would include: episodic hypoxia,  Acid base disturbance, metabolic acidosis, hypoxia, hemodynamic monitoring requiring pressor therapy, AKI Cr elevation/aneuria requiring CRRT and later HD. Patient now with metabolic disarray of hypokalemia, hypoxia, s/p cardiogenic shock, hypovolemia with baseline Hgb 15.6 that experienced constant downtrend since admission to low of 6.8 subsequently requiring 2 units of PRBC's. H/H remain today 7.1/22.1 without unknown etiology.   EEG on 7/30 diffuse slowing no epilepsy. Patient was documented to be following simple commands as of 8/1 and was extubated on 8/2. Apparently neuro has been consulted for increased and worsening delirium. Per nursing Patient's family stated that patient was able to speak and follow commands and since  the last 3 days he has because increasingly agitated require regular doses of Ativan after being taking off Precedex. He is currently receiving Schedule Seroquel 50 mg BID in which he was taking outpatient and stropped  Over 1 month ago per family. On admission his QTC was elevated >500 with improvement today of QTC in 300's (on chart EKG). There is no family at bedside, and patient is somnolent with aggressive stimulation for him to awake short term. He is prompted for some commands but speech appears incomprehensible.      CT brain w/o 05/22/18- No acute findings of intracranial abnormality  CT cervical spine- 05/22/18 Disc levels: Slight disc space narrowing at C5-6 and C6-7. Mild degenerative facet disease bilaterally. Upper chest: Mild emphysema in the apices  ROS: Unable to obtain due to altered mental status.   History reviewed. No pertinent past medical history. History reviewed. No pertinent family history.   Social History:   reports that he has been smoking cigarettes. He has been smoking about 1.00 pack per day. He uses smokeless tobacco. His alcohol and drug histories are not on file.  Medications  Current Facility-Administered Medications:  .  0.9 %  sodium chloride infusion, 250 mL, Intravenous, PRN, Hammonds, Sharyn Blitz, MD, Last Rate: 10 mL/hr at 05/27/18 1647, 500 mL at 05/27/18 1647 .  albuterol (PROVENTIL) (2.5 MG/3ML) 0.083% nebulizer solution 2.5 mg, 2.5 mg, Nebulization, Q2H PRN, Ollis, Brandi L, NP .  [START ON 06/06/2018] aspirin chewable tablet 81 mg, 81 mg, Per Tube, Daily, Lorretta Harp, MD, 81 mg at 06/05/18 1301 .  B-complex with vitamin C tablet 1 tablet, 1 tablet, Per Tube, Daily, Icard, Bradley L, DO, 1 tablet at 06/02/18 1119 .  carvedilol (COREG) tablet 6.25  mg, 6.25 mg, Per NG tube, BID WC, Turner, Traci R, MD .  Chlorhexidine Gluconate Cloth 2 % PADS 6 each, 6 each, Topical, Daily, Lorretta Harp, MD, 6 each at 06/04/18 2314 .  Chlorhexidine  Gluconate Cloth 2 % PADS 6 each, 6 each, Topical, Q0600, Rexene Agent, MD, 6 each at 06/04/18 [id number] .  Darbepoetin Alfa (ARANESP) injection 100 mcg, 100 mcg, Intravenous, Q Thu-HD, Pearson Grippe B, MD, 100 mcg at 06/03/18 1116 .  docusate (COLACE) 50 MG/5ML liquid 100 mg, 100 mg, Per Tube, BID PRN, Hammonds, Sharyn Blitz, MD .  Derrill Memo ON 06/06/2018] famotidine (PEPCID) 40 MG/5ML suspension 20 mg, 20 mg, Per Tube, Daily, Lorretta Harp, MD .  feeding supplement (JEVITY 1.2 CAL) liquid 1,000 mL, 1,000 mL, Per Tube, Continuous, Tarry Kos, MD, Last Rate: 50 mL/hr at 06/05/18 1333, 1,000 mL at 06/05/18 1333 .  feeding supplement (NEPRO CARB STEADY) liquid 1,000 mL, 1,000 mL, Per Tube, Q24H, Tarry Kos, MD, Last Rate: 20 mL/hr at 06/05/18 1333, 237 mL at 06/05/18 1333 .  feeding supplement (PRO-STAT SUGAR FREE 64) liquid 30 mL, 30 mL, Per Tube, BID, Tarry Kos, MD, 30 mL at 06/05/18 1302 .  folic acid (FOLVITE) tablet 1 mg, 1 mg, Oral, Daily, Icard, Bradley L, DO, 1 mg at 06/05/18 1302 .  Gerhardt's butt cream, , Topical, PRN, Justin Mend, MD .  hydrALAZINE (APRESOLINE) injection 10-20 mg, 10-20 mg, Intravenous, Q4H PRN, Brand Males, MD, 20 mg at 06/02/18 0740 .  insulin aspart (novoLOG) injection 0-9 Units, 0-9 Units, Subcutaneous, Q4H, Eubanks, Katalina M, NP, 1 Units at 06/01/18 1230 .  labetalol (NORMODYNE,TRANDATE) injection 20 mg, 20 mg, Intravenous, Q4H PRN, Tarry Kos, MD .  LORazepam (ATIVAN) injection 1-2 mg, 1-2 mg, Intravenous, Q1H PRN, Frederik Pear, MD, 2 mg at 06/05/18 1115 .  LORazepam (ATIVAN) tablet 2 mg, 2 mg, Oral, TID, 2 mg at 06/04/18 2204 **FOLLOWED BY** [START ON 06/07/2018] LORazepam (ATIVAN) tablet 1 mg, 1 mg, Oral, TID **FOLLOWED BY** [START ON 06/10/2018] LORazepam (ATIVAN) tablet 0.5 mg, 0.5 mg, Oral, TID, Agarwala, Ravi, MD .  MEDLINE mouth rinse, 15 mL, Mouth Rinse, BID, Lorretta Harp, MD, 15 mL at 06/04/18 2314 .   multivitamin with minerals tablet 1 tablet, 1 tablet, Oral, Daily, Ogan, Kerry Kass, MD, 1 tablet at 06/05/18 1301 .  nitroGLYCERIN (NITROSTAT) SL tablet 0.4 mg, 0.4 mg, Sublingual, Q5 min PRN, Ogan, [place]or[place]wo U, MD, 0.4 mg at 06/04/18 0258 .  QUEtiapine (SEROQUEL) tablet 50 mg, 50 mg, Oral, BID, Tarry Kos, MD .  rosuvastatin (CRESTOR) tablet 10 mg, 10 mg, Oral, q1800, [place], Peter M, MD, 10 mg at 06/03/18 1853 .  sodium chloride flush (NS) 0.9 % injection 3 mL, 3 mL, Intravenous, Q12H, Lorretta Harp, MD, 3 mL at 06/04/18 2317 .  sodium chloride flush (NS) 0.9 % injection 3 mL, 3 mL, Intravenous, PRN, Lorretta Harp, MD .  Derrill Memo ON 06/06/2018] thiamine (VITAMIN B-1) tablet 100 mg, 100 mg, Per Tube, Daily, Lorretta Harp, MD, 100 mg at 06/05/18 1301 .  ticagrelor (BRILINTA) tablet 90 mg, 90 mg, Per Tube, BID, Lorretta Harp, MD, 90 mg at 06/05/18 1301 .  zinc sulfate capsule 220 mg, 220 mg, Oral, Daily, Icard, Bradley L, DO, 220 mg at 06/02/18 1119   Exam: Current vital signs: BP (!) 154/88 (BP Location: Left Arm)   Pulse 98   Temp 98.6 F (37 C) (Axillary)  Resp 18   Ht 5\' 10"  (1.778 m)   Wt 59.6 kg   SpO2 100%   BMI 18.85 kg/m  Vital signs in last 24 hours: Temp:  [97.6 F (36.4 C)-99.1 F (37.3 C)] 98.6 F (37 C) (08/10 1109) Pulse Rate:  [69-102] 98 (08/10 1109) Resp:  [18-30] 18 (08/10 1109) BP: (149-202)/(74-107) 154/88 (08/10 1109) SpO2:  [100 %] 100 % (08/10 1109) Weight:  [59.6 kg-61.8 kg] 59.6 kg (08/10 0600)  GAD- looks older than age  GENERAL: Somnolent  HEENT: - Normocephalic and atraumatic, dry mm, no LN++, no Thyromegally LUNGS - Course sound anterior auscultation  CV - S1S2 RRR, no m/r/g, equal pulses bilaterally. ABDOMEN - Soft, flat  Ext: warm, intact peripheral pulses, edema  NEURO:  Mental Status:Somnolent, following occasional simple commands with prompting   Language: speech is incomprehensible  Cranial Nerves: PERRL 64mm  sluggish. Spontaneous eye movement, no facial asymmetry, hearing appears intact, tongue Motor: Spontaneous movement with Ext's, and movement with noxious stimuli- patient with bilaterak mittens for restraints Tone: is normal and bulk is normal Sensation- grimace and flexion with noxious stimuli  Coordination: Deferred d/t to current LOC  Gait- deferred  Labs I have reviewed labs in epic and the results pertinent to this consultation are:  Cr. 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 AST 34, ALT 8, ALP 134  Mild leukocytosis WBC 13.5 yet consistent trend  Hgb HCT w/ low volume 7.1/22.1- baseline Hgb during admission as high 15.6  Platelets 232,  Ferritin 121  Ammonia 22  TSH 1.044 7/27  8/9 Blood cultures without growth  Last ABG PH 7.5, PCO2 71, HC03 27  CBC    Component Value Date/Time   WBC 13.5 (H) 06/04/2018 0943   RBC 2.87 (L) 06/04/2018 0943   HGB 8.0 (L) 06/04/2018 0943   HCT 24.7 (L) 06/04/2018 0943   PLT 209 06/04/2018 0943   MCV 86.1 06/04/2018 0943   MCH 27.9 06/04/2018 0943   MCHC 32.4 06/04/2018 0943   RDW 21.6 (H) 06/04/2018 0943   LYMPHSABS 0.4 (L) 06/04/2018 0943   MONOABS 1.1 (H) 06/04/2018 0943   EOSABS 0.0 06/04/2018 0943   BASOSABS 0.0 06/04/2018 0943    CMP     Component Value Date/Time   NA 136 06/05/2018 0238   K 4.9 06/05/2018 0238   CL 96 (L) 06/05/2018 0238   CO2 27 06/05/2018 0238   GLUCOSE 83 06/05/2018 0238   BUN 41 (H) 06/05/2018 0238   CREATININE 5.45 (H) 06/05/2018 0238   CALCIUM 8.2 (L) 06/05/2018 0238   PROT 5.6 (L) 06/05/2018 0238   ALBUMIN 2.3 (L) 06/05/2018 0238   ALBUMIN 2.3 (L) 06/05/2018 0238   AST 34 06/05/2018 0238   ALT 8 06/05/2018 0238   ALKPHOS 127 (H) 06/05/2018 0238   BILITOT 1.4 (H) 06/05/2018 0238   GFRNONAA 11 (L) 06/05/2018 0238   GFRAA 13 (L) 06/05/2018 0238     Lipid Panel     Component Value Date/Time   CHOL 125 05/22/2018 1745   TRIG 59 05/22/2018 2026   HDL 40 (L) 05/22/2018 1745   CHOLHDL 3.1 05/22/2018 1745   VLDL 11 05/22/2018 1745   LDLCALC 74 05/22/2018 1745     Imaging I have reviewed the images obtained: CT-scan of the brain to see if there has been  any changes    Assessment:  52 y.o. male with a PMH- Lupus, Dyslipidemia, Tobacco abuse, Alcohol abuse, Cocaine and substance abuse, myasthenia gravis s/p thymus removal presented on 05/22/18 after being found down in the field as an unwitnessed arrest with CPR/ He is s/p LAD stenting, hypothermia protocol, extubation with current evidence of hypovolemia, acid base imbalance, and metabolic disarray. Patient with new onset of delirium and incomprehensible speech 3 days ago prompting neurology consult. Last image CT brain 7/27.       Impression:Deliruium secondary to metabolic/ ? anoxic encephalopathy secondary to metabolic disarray.   Recommendations: 1.Delirium workup Folate, B12, No thiamine as patient has been administered supplement which would likely alter result.  TSH normal range,  TSH 1.044 7/2 Ammonia normal range  22   2. Avoid use of Benz/ ativan which could be altering patient's assessment and sometimes cause delirium worsening and sometime overuse can cause hypoxia with resp suppression.   3. Initiate Precedex, would likely benefit to obtain MRI and is titratable. Avoid polypharmacy with sedatives to prevent patient decline. Patient will likey need to move to ICU for gtt as well as monitoring- discussed with Attending Dr. Debbora Dus.   3. MRI Brain w/o contrast to r/o stroke with new onset of incomprehensible speech. Will likely be attainable if Precedex is restarted.  4. Continue medical management for metabolic recovery. Investigate possible causes of Hgb/ Hct decline despite PRBC infusion and remain low count without recovery.   Discussed with charge RN, bedside  RN and MD  Lilyan Gilford, NP, Neurology hospitalist team    I have seen the patient and reviewed the note.   He was improving until 3 days ago at which point he began progressively declining and becoming agitated. At this point, I think this represents a multifactorial metabolic encephalopathy due to renal dysfunction, ICU delirium, likely some degree of hypoxic ischemic injury, medications including benzodiazepines, possibly some degree of continued DTs.  Given his degree of dysarthria, however, I would favor further imaging with MRI.  He may do better with Precedex than benzodiazepines, and could use this to help with his agitation.  This would also likely assist with obtaining an MRI as I do not think he would be cooperative as he is currently.  Roland Rack, MD [facility] [phone]  If 7pm- 7am, please page neurology on call as listed in [facility].

## 2018-06-05 NOTE — Progress Notes (Addendum)
Patient bladder scanned and has 103 mls of urine in bladder. Will bladder scan again at 0600. Will continue to monitor.

## 2018-06-05 NOTE — Progress Notes (Signed)
Cortrak Tube Team Note:  Consult received to place a Cortrak feeding tube.   A 10 F Cortrak tube was placed in the R nare and secured with a nasal bridle at 88 cm. Per the Cortrak monitor reading the tube tip is postpyloric, approximately D1-D2.   No x-ray is required. RN may begin using tube.   If the tube becomes dislodged please keep the tube and contact the Cortrak team at [email or web address] (password [email or web address]) for replacement.  If after hours and replacement cannot be delayed, place a NG tube and confirm placement with an abdominal x-ray.   2C dietitian had seen patient 8/7. Recommended initiation of tube feeding with following regimen:  Initiate Nepro @ 20 ml/hr and increase by 10 ml every 8 hours to goal rate of 40 ml/hr.  30 ml Prostat BID.    Tube feeding regimen provides 1928 kcal (100% of needs), 108 grams of protein, and 754 ml of H2O.   New Dry wt: 131.4 lbs. TF providing 32 kcal/kg and 1.8g/kg Protein  Burtis Junes RD, LDN, CNSC Clinical Nutrition Available Tues-Sat via Pager: [phone] 06/05/2018 12:18 PM

## 2018-06-05 NOTE — Progress Notes (Signed)
Bladder scan from 1820: 93mL

## 2018-06-05 NOTE — Progress Notes (Signed)
Pt spitting up all morning meds; won't drink OJ (BG 68); may need something additional for BG; MD notified.   Howard  Calton Harshfield, RN

## 2018-06-05 NOTE — Progress Notes (Signed)
Spoke to Dr. Debbora Dus, MD - patient is to be transferred to ICU for Precedex IV gtt.  Orders to be placed.

## 2018-06-05 NOTE — Progress Notes (Signed)
Report given to nurse assigned to 2M12.   [place]  Kaimani Clayson, RN

## 2018-06-05 NOTE — Progress Notes (Signed)
I had neurolgy see Howard Thompson. He appear s to be a bit unmanageable on the stepdown unit. Neuroogy would like to limit the use of Benzos ans would like top get a MRI brain. I am moving Howard Thompson to the ICU for precedexand close montoring.  In addition neurolgy pointed out that Howard Thompson had a significant drop in his HB since admission. On 7.28 his Hb was over 12 and now it is about 7 after recent ransfusion.there is no over bleeding form the gi tract presently. He is hemodyamically stable.the fac tis he is hypertensive. I sent for stool ob abd iron studiies.the opatient may need a CT abdo /pelvis perhaps tomorrow if wecan settle him down to make sure he did not have aretroperitoneal bleed at some point

## 2018-06-06 ENCOUNTER — Other Ambulatory Visit: Payer: Self-pay

## 2018-06-06 ENCOUNTER — Inpatient Hospital Stay (HOSPITAL_COMMUNITY): Payer: No Typology Code available for payment source

## 2018-06-06 LAB — RENAL FUNCTION PANEL
Albumin: 2.1 g/dL — ABNORMAL LOW (ref 3.5–5.0)
Anion gap: 16 — ABNORMAL HIGH (ref 5–15)
BUN: 99 mg/dL — ABNORMAL HIGH (ref 6–20)
CHLORIDE: 98 mmol/L (ref 98–111)
CO2: 24 mmol/L (ref 22–32)
Calcium: 8 mg/dL — ABNORMAL LOW (ref 8.9–10.3)
Creatinine, Ser: 8.89 mg/dL — ABNORMAL HIGH (ref 0.61–1.24)
GFR calc Af Amer: 7 mL/min — ABNORMAL LOW (ref >60)
GFR, EST NON AFRICAN AMERICAN: 6 mL/min — AB (ref >60)
Glucose, Bld: 107 mg/dL — ABNORMAL HIGH (ref 70–99)
Phosphorus: 9.1 mg/dL — ABNORMAL HIGH (ref 2.5–4.6)
Potassium: 5.3 mmol/L — ABNORMAL HIGH (ref 3.5–5.1)
Sodium: 138 mmol/L (ref 135–145)

## 2018-06-06 LAB — CBC
HEMATOCRIT: 22 % — AB (ref 39.0–52.0)
Hemoglobin: 7.1 g/dL — ABNORMAL LOW (ref 13.0–17.0)
MCH: 28.2 pg (ref 26.0–34.0)
MCHC: 32.3 g/dL (ref 30.0–36.0)
MCV: 87.3 fL (ref 78.0–100.0)
PLATELETS: 253 10*3/uL (ref 150–400)
RBC: 2.52 MIL/uL — ABNORMAL LOW (ref 4.22–5.81)
RDW: 20.7 % — AB (ref 11.5–15.5)
WBC: 9.7 10*3/uL (ref 4.0–10.5)

## 2018-06-06 LAB — CBC WITH DIFFERENTIAL/PLATELET
BASOS PCT: 1 %
Basophils Absolute: 0.1 10*3/uL (ref 0.0–0.1)
EOS PCT: 2 %
Eosinophils Absolute: 0.2 10*3/uL (ref 0.0–0.7)
HEMATOCRIT: 19.4 % — AB (ref 39.0–52.0)
HEMOGLOBIN: 6.1 g/dL — AB (ref 13.0–17.0)
LYMPHS PCT: 9 %
Lymphs Abs: 0.8 10*3/uL (ref 0.7–4.0)
MCH: 28.1 pg (ref 26.0–34.0)
MCHC: 31.4 g/dL (ref 30.0–36.0)
MCV: 89.4 fL (ref 78.0–100.0)
MONO ABS: 0.9 10*3/uL (ref 0.1–1.0)
Monocytes Relative: 10 %
NEUTROS PCT: 78 %
Neutro Abs: 6.7 10*3/uL (ref 1.7–7.7)
Platelets: 244 10*3/uL (ref 150–400)
RBC: 2.17 MIL/uL — ABNORMAL LOW (ref 4.22–5.81)
RDW: 22 % — ABNORMAL HIGH (ref 11.5–15.5)
WBC: 8.7 10*3/uL (ref 4.0–10.5)

## 2018-06-06 LAB — MAGNESIUM: MAGNESIUM: 2.9 mg/dL — AB (ref 1.7–2.4)

## 2018-06-06 LAB — GLUCOSE, CAPILLARY
GLUCOSE-CAPILLARY: 100 mg/dL — AB (ref 70–99)
GLUCOSE-CAPILLARY: 102 mg/dL — AB (ref 70–99)
Glucose-Capillary: 103 mg/dL — ABNORMAL HIGH (ref 70–99)
Glucose-Capillary: 103 mg/dL — ABNORMAL HIGH (ref 70–99)
Glucose-Capillary: 109 mg/dL — ABNORMAL HIGH (ref 70–99)
Glucose-Capillary: 110 mg/dL — ABNORMAL HIGH (ref 70–99)
Glucose-Capillary: 112 mg/dL — ABNORMAL HIGH (ref 70–99)

## 2018-06-06 LAB — HEPATIC FUNCTION PANEL
ALK PHOS: 112 U/L (ref 38–126)
ALT: 8 U/L (ref 0–44)
AST: 27 U/L (ref 15–41)
Albumin: 2.1 g/dL — ABNORMAL LOW (ref 3.5–5.0)
Bilirubin, Direct: 0.3 mg/dL — ABNORMAL HIGH (ref 0.0–0.2)
Indirect Bilirubin: 0.5 mg/dL (ref 0.3–0.9)
TOTAL PROTEIN: 5.3 g/dL — AB (ref 6.5–8.1)
Total Bilirubin: 0.8 mg/dL (ref 0.3–1.2)

## 2018-06-06 LAB — RETICULOCYTES
RBC.: 2.17 MIL/uL — ABNORMAL LOW (ref 4.22–5.81)
RETIC CT PCT: 4.3 % — AB (ref 0.4–3.1)
Retic Count, Absolute: 93.3 10*3/uL (ref 19.0–186.0)

## 2018-06-06 LAB — PREPARE RBC (CROSSMATCH)

## 2018-06-06 LAB — RPR: RPR Ser Ql: NONREACTIVE

## 2018-06-06 LAB — PROCALCITONIN: PROCALCITONIN: 5.34 ng/mL

## 2018-06-06 MED ORDER — SODIUM CHLORIDE 0.9% IV SOLUTION
Freq: Once | INTRAVENOUS | Status: AC
  Administered 2018-06-06: 12:00:00 via INTRAVENOUS

## 2018-06-06 NOTE — Progress Notes (Signed)
Pt is XXX. Pt has family members visiting from [place]. Per pts sister, Wilkie Aye, if airline calls, we may tell them that pt is in ICU.

## 2018-06-06 NOTE — Progress Notes (Addendum)
GE:[id number], AMS, Speech abnormality   PMH- Lupus, Dyslipidemia, Tobacco abuse, Alcohol abuse< Cocaine and substance abuse, myasthenia gravis s/p thymus removal presented on 05/22/18   Presented on 05/22/18 after being found down in the field as an unwitnessed arrest with CPR/ He is s/p LAD stenting, hypothermia protocol, extubation with current evidence of hypovolemia, acid base imbalance, and metabolic disarray. Patient with new onset of delirium and incomprehensible speech 3 days ago prompting neurology consult. Last image CT brain 7/27. Awaiting MRI for r/o of stroke.  8/11- Today patient appears improved on assessment with Precedex gtt now transfer to ICU. He appears calm without agitation, with his sister at bedside. Plan of care is discussed with nursing and sister at bedside.     Exam: Vitals:   06/06/18 0600 06/06/18 0740  BP: 125/73   Pulse: 64   Resp: (!) 23   Temp:  98 F (36.7 C)  SpO2: 97%     Physical Exam  GAD- looks older than age, calm  GENERAL: lethargic awakening to name calling and stimulus HEENT: - Normocephalic and atraumatic, dry mm, no LN++, no Thyromegally, w/DHT LUNGS - Course sound anterior auscultation  CV - S1S2 RRR, no m/r/g, equal pulses bilaterally. ABDOMEN - Soft, flat  Ext: warm, intact peripheral pulses, edema  NEURO:  Mental Status:Lethagic, following commands today, improvement for yesterday  Language: Little speech, "Sister sates patient asked for water, nursing confirmed better today, Nods head appropriately "yes" and not to questions asked  Cranial Nerves: PERRL 34mm bilaterally with response. Spontaneous eye movement, no facial asymmetry, hearing appears intact, tongue MM dry Motor: Spontaneous movement with Ext's on command, and movement with noxious stimuli t/o for stimulation- patient with bilateral mittens for restraints Tone: is normal and bulk is normal Sensation- appears grossly intact  Coordination: Deferred d/t to current LOC  Gait-  deferred  Medications:  Scheduled: . aspirin  81 mg Per Tube Daily  . B-complex with vitamin C  1 tablet Per Tube Daily  . carvedilol  6.25 mg Per NG tube BID WC  . Chlorhexidine Gluconate Cloth  6 each Topical Q0600  . darbepoetin (ARANESP) injection - DIALYSIS  100 mcg Intravenous Q Thu-HD  . famotidine  20 mg Per Tube Daily  . feeding supplement (NEPRO CARB STEADY)  1,000 mL Per Tube Q24H  . feeding supplement (PRO-STAT SUGAR FREE 64)  30 mL Per Tube BID  . folic acid  1 mg Oral Daily  . insulin aspart  0-9 Units Subcutaneous Q4H  . LORazepam  2 mg Oral TID   Followed by  . [START ON 06/07/2018] LORazepam  1 mg Oral TID   Followed by  . [START ON 06/10/2018] LORazepam  0.5 mg Oral TID  . mouth rinse  15 mL Mouth Rinse BID  . multivitamin with minerals  1 tablet Oral Daily  . QUEtiapine  50 mg Oral BID  . rosuvastatin  10 mg Oral q1800  . sodium chloride flush  3 mL Intravenous Q12H  . thiamine  100 mg Per Tube Daily  . ticagrelor  90 mg Per Tube BID  . zinc sulfate  220 mg Oral Daily   Continuous: . sodium chloride 250 mL (06/05/18 2138)  . dexmedetomidine (PRECEDEX) IV infusion 0.7 mcg/kg/hr (06/06/18 0759)   GMW:NUUVOZ chloride, albuterol, docusate, Gerhardt's butt cream, hydrALAZINE, labetalol, LORazepam, nitroGLYCERIN, sodium chloride flush  Pertinent Labs/Diagnostics: TSH normal range,  TSH 1.044 7/2 Ammonia normal range  22  B12- 2,144 elevated, TIBC 17  No results  found.   Assessment: 52 y.o. male with a PMH- Lupus, Dyslipidemia, Tobacco abuse, Alcohol abuse, Cocaine and substance abuse, myasthenia gravis s/p thymus removal presented on 05/22/18 after being found down in the field as an unwitnessed arrest with CPR/ He is s/p LAD stenting, hypothermia protocol, extubation with current evidence of hypovolemia, acid base imbalance, and metabolic disarray. Patient with new onset of delirium and incomprehensible speech 3 days ago prompting neurology consult. Last image CT  brain 7/27. Patient was unable to complete MRI through-out evening      Impression:  He was improving until 3 days ago at which point he began progressively declining and becoming agitated. At this point, I think this represents a multifactorial metabolic encephalopathy due to renal dysfunction, ICU delirium, likely some degree of hypoxic ischemic injury, medications including benzodiazepines, possibly some degree of continued DTs.  Given his degree of dysarthria, however, I would favor further imaging with MRI. Patient appears improved on today's assessment.    Recommendations: 1.Delirium workup Folate,  No thiamine as patient has been administered supplement which would likely alter result.  TSH normal range,  TSH 1.044 7/2 Ammonia normal range  22  B12- 2,144 elevated, TIBC 17   2. Avoid use of Benz/ ativan which could be altering patient's assessment and sometimes cause delirium worsening and sometime overuse can cause hypoxia with resp suppression. Continue with Seroquel and Precedex at least until scan is completed and wean appropriately of per CCM  3. Continue medical management for metabolic recovery. Investigate possible causes of Hgb/ Hct decline despite PRBC infusion and remain low count without recovery. Continues to decline without sources of bleeding Hgb today 6.1. Patient is going to be infused today per CCM, and Cards   3. MRI Brain w/o contrast to r/o stroke with new onset of incomprehensible speech. When patient is able to tolerate    Lilyan Gilford, NP- Neurology NP If 7pm- 7am, please page neurology on call as listed in [facility].  06/06/2018, 8:54 AM   I have seen the patient and reviewed the above note.   He is less agitated today, but is sedated on precedex.   At this point, I suspect multifactorial delirium, but given worsening, we are awaiting imaging.   Roland Rack, MD [facility] [phone]

## 2018-06-06 NOTE — Plan of Care (Signed)
  Problem: Coping: Goal: Level of anxiety will decrease Outcome: Not Progressing   Problem: Elimination: Goal: Will not experience complications related to urinary retention Outcome: Not Progressing   Problem: Pain Managment: Goal: General experience of comfort will improve Outcome: Not Progressing    Problem: Skin Integrity: Goal: Risk for impaired skin integrity will decrease Outcome: Not Progressing

## 2018-06-06 NOTE — Progress Notes (Signed)
Pt has orders for MRI brain and CT bad/pelivs. Per neuro NP, would like MRI today if pt able to tolerate. Spoke w/ CT and MRI. Per MRI, very backed up, pt may not be able to be seen today. Will cont to f/u

## 2018-06-06 NOTE — Progress Notes (Signed)
Patient c/o chest pain. X1 nitro given. BP 152/79 (98). Getting EKG. Will continue to monitor.

## 2018-06-06 NOTE — Progress Notes (Signed)
Progress Note  Patient Name: Howard Thompson Date of Encounter: 06/06/2018  Primary Cardiologist: No primary care provider on file.  Subjective   Resting quietly this am.  Getting meds via NGT.  Maintaining NSR  Inpatient Medications    Scheduled Meds: . sodium chloride   Intravenous Once  . aspirin  81 mg Per Tube Daily  . B-complex with vitamin C  1 tablet Per Tube Daily  . carvedilol  6.25 mg Per NG tube BID WC  . Chlorhexidine Gluconate Cloth  6 each Topical Q0600  . darbepoetin (ARANESP) injection - DIALYSIS  100 mcg Intravenous Q Thu-HD  . famotidine  20 mg Per Tube Daily  . feeding supplement (NEPRO CARB STEADY)  1,000 mL Per Tube Q24H  . feeding supplement (PRO-STAT SUGAR FREE 64)  30 mL Per Tube BID  . folic acid  1 mg Oral Daily  . insulin aspart  0-9 Units Subcutaneous Q4H  . LORazepam  2 mg Oral TID   Followed by  . [START ON 06/07/2018] LORazepam  1 mg Oral TID   Followed by  . [START ON 06/10/2018] LORazepam  0.5 mg Oral TID  . mouth rinse  15 mL Mouth Rinse BID  . multivitamin with minerals  1 tablet Oral Daily  . QUEtiapine  50 mg Oral BID  . rosuvastatin  10 mg Oral q1800  . sodium chloride flush  3 mL Intravenous Q12H  . thiamine  100 mg Per Tube Daily  . ticagrelor  90 mg Per Tube BID  . zinc sulfate  220 mg Oral Daily   Continuous Infusions: . sodium chloride 250 mL (06/05/18 2138)  . dexmedetomidine (PRECEDEX) IV infusion 0.7 mcg/kg/hr (06/06/18 0759)   PRN Meds: sodium chloride, albuterol, docusate, Gerhardt's butt cream, hydrALAZINE, labetalol, LORazepam, nitroGLYCERIN, sodium chloride flush   Vital Signs    Vitals:   06/06/18 0500 06/06/18 0530 06/06/18 0600 06/06/18 0740  BP: 116/74 121/76 125/73   Pulse: 68 66 64   Resp: (!) 23 (!) 21 (!) 23   Temp:    98 F (36.7 C)  TempSrc:    Axillary  SpO2: 100% 99% 97%   Weight:      Height:        Intake/Output Summary (Last 24 hours) at 06/06/2018 1045 Last data filed at 06/06/2018  0604 Gross per 24 hour  Intake 728.1 ml  Output 100 ml  Net 628.1 ml   Filed Weights   06/05/18 0600 06/05/18 2018 06/06/18 0301  Weight: 59.6 kg 59.3 kg 59.3 kg    Telemetry    NSR - Personally Reviewed  ECG    NSR with minimal voltage criteria for LVH- Personally Reviewed  Physical Exam   GEN: Well nourished, well developed in no acute distress HEENT: Normal NECK: No JVD; No carotid bruits LYMPHATICS: No lymphadenopathy CARDIAC:RRR, no rubs, gallops.  2/6 SM at LLSB to apex RESPIRATORY:  Clear to auscultation without rales, wheezing or rhonchi  ABDOMEN: Soft, non-tender, non-distended MUSCULOSKELETAL:  No edema; No deformity  SKIN: Warm and dry NEUROLOGIC:  Cannot assess PSYCHIATRIC:  Much less agitated  Labs    Chemistry Recent Labs  Lab 06/04/18 0943 06/05/18 0238 06/05/18 1226 06/06/18 0425  NA 138 136 139 138  K 6.2* 4.9 5.1 5.3*  CL 95* 96* 99 98  CO2 25 27 25 24   GLUCOSE 88 83 92 107*  BUN 71* 41* 59* 99*  CREATININE 8.11* 5.45* 6.91* 8.89*  CALCIUM 8.5* 8.2* 8.2* 8.0*  PROT 6.1* 5.6*  --  5.3*  ALBUMIN 2.6* 2.3*  2.3*  --  2.1*  2.1*  AST 36 34  --  27  ALT 7 8  --  8  ALKPHOS 137* 127*  --  112  BILITOT 1.4* 1.4*  --  0.8  GFRNONAA 7* 11* 8* 6*  GFRAA 8* 13* 10* 7*  ANIONGAP 18* 13 15 16*     Hematology Recent Labs  Lab 06/04/18 0943 06/05/18 1226 06/06/18 0425  WBC 13.5* 11.7* 8.7  RBC 2.87* 2.51* 2.17*  2.17*  HGB 8.0* 7.1* 6.1*  HCT 24.7* 22.1* 19.4*  MCV 86.1 88.0 89.4  MCH 27.9 28.3 28.1  MCHC 32.4 32.1 31.4  RDW 21.6* 21.7* 22.0*  PLT 209 232 244    Cardiac Enzymes No results for input(s): TROPONINI in the last 168 hours. No results for input(s): TROPIPOC in the last 168 hours.   BNPNo results for input(s): BNP, PROBNP in the last 168 hours.   DDimer No results for input(s): DDIMER in the last 168 hours.    Radiology    No results found.  Cardiac Studies        Post-Intervention Diagram           ------------------------------------------------------------------- ECHO Study Conclusions  - Left ventricle: The cavity size was normal. Systolic function was severely reduced. The estimated ejection fraction was in the range of 25% to 30%. Severe diffuse hypokinesis with regional variations. There is akinesis of the apicalanterior and apical myocardium. There is akinesis of the midanteroseptal myocardium. There is severe hypokinesis of the inferoseptal myocardium. Features are consistent with a pseudonormal left ventricular filling pattern, with concomitant abnormal relaxation and increased filling pressure (grade 2 diastolic dysfunction). Doppler parameters are consistent with high ventricular filling pressure. - Aortic valve: A bicuspid morphology cannot be excluded; mildly thickened, mildly calcified leaflets. There was mild to moderate regurgitation. Regurgitation pressure half-time: 718 ms. - Mitral valve: There was mild to moderate regurgitation. Valve area by pressure half-time: 2.39 cm^2. - Right ventricle: Systolic function was moderately reduced. - Atrial septum: There was increased thickness of the septum, consistent with lipomatous hypertrophy. - Tricuspid valve: There was mild regurgitation. - Pulmonary arteries: PA peak pressure: 33 mm Hg (S). - Inferior vena cava: The vessel was dilated. The respirophasic diameter changes were blunted (<50%), consistent with elevated central venous pressure. - Pericardium, extracardiac: A small, free-flowing pericardial effusion was identified circumferential to the heart. The fluid had no internal echoes.There was no evidence of hemodynamic compromise.  Impressions:  - severe LV dysfunction with diffuse HK but apica/anteroseptal and apical anterior AK. Recommend definity contrast study to rule out apical thrombus.  Patient Profile     52 y.o. male out of hospital cardiac  arrest, prior history of MI several years ago, myasthenia gravis post thymus resection, anterior ST elevation post LAD stent. Extubated on 8/2.   Assessment & Plan    1. Cardiac arrest -Underwent cath with PCI/DES x1 to the pLAD on 05/22/18. -Continue  DAPT with ASA/Brilinta for 12 months.   -Currently not taking meds due to acute delirium -CCM as ordered NG tube  -ASA was held in the setting of thrombocytopenia, but has since been resumed. Platelet count improved.  -Statin held 2/2 to elevated LFTs. LFTs have normalized.  -Continue Crestor 10 mg daily, ASA 81 mg and Brilinta 90 mg twice daily  per NG tube as he is not taking p.o. due to delirium  -HTN and tachycardia resolved with treatment  of agitation   -in light of h/o cocaine use, lopressor changed  to carvedilol 6.25mg  BID per NGT  2.  ICM -EF noted at 25-30%.  -No room for ARB/ACEi given AKF -continue carvedilol  3. Anoxic encephalopathy - EEG was abnormal but repeat now improving.  -Now with acute delirium and agitation -Neuro following and on Seroquel/Ativan  4. Acute renal failure: -Nephrology following.  -S/p placement of new dialysis catheter right subclavian.   -Creatinine was 10.44 and potassium 6.3 and received dialysis.   -Creatinine 8.89 and potassium 5.3 today.   -per nephrology  5. Polysubstance abuse: + for cocaine on admission.   6. Acute blood loss anemia 2/2 to bleeding HD cath -Hgb dropped to 6, now s/p 2 units of PRBCs.  -Hgb dropped to 6.3 this am -Ideally would like Hgb above 8 given coronary issues but need to balance with volume issues. Will monitor Hgb with dialysis.   7.  Systolic heart murmur at the apex -2D echo 05/23/2018 showed mild to moderate mitral regurgitation and mild to moderate aortic insufficiency   Signed, Fransico Him, MD  06/06/2018, 10:45 AM    For questions or updates, please contact [place] Please consult [email or web address] for contact info under  Cardiology/STEMI.

## 2018-06-06 NOTE — Progress Notes (Signed)
Chest pain relieved after X2 nitro tabs. Will continue to monitor patient.

## 2018-06-06 NOTE — Progress Notes (Signed)
S:Agitation continued and pt was transferred to ICU to start precedex.  Resting more comfortably this morning, sister at bedside. O:BP 125/73   Pulse 64   Temp 98 F (36.7 C) (Axillary)   Resp (!) 23   Ht 5\' 10"  (1.778 m)   Wt 59.3 kg   SpO2 97%   BMI 18.76 kg/m   Intake/Output Summary (Last 24 hours) at 06/06/2018 1108 Last data filed at 06/06/2018 0604 Gross per 24 hour  Intake 718.1 ml  Output 100 ml  Net 618.1 ml   Intake/Output: I/O last 3 completed shifts: In: 728.1 [I.V.:178.4; NG/GT:549.7] Out: 2096 [Other:1996; Stool:100]  Intake/Output this shift:  No intake/output data recorded. Weight change: -2.5 kg Gen: sedated and resting comfortably CVS: RRR, no rub Resp: occ rhonchi bilaterally Abd: +BS, soft, NT/ND Ext:no edema  Recent Labs  Lab 05/31/18 0409  06/01/18 0251 06/02/18 0240 06/02/18 1616 06/03/18 0759 06/04/18 0335 06/04/18 0943 06/05/18 0238 06/05/18 1226 06/06/18 0425  NA 141   < > 140 136 138 137 138 138 136 139 138  K 4.8   < > 5.5* 5.2* 5.7* 6.3* 6.0* 6.2* 4.9 5.1 5.3*  CL 105   < > 103 98 101 99 96* 95* 96* 99 98  CO2 24   < > 20* 24 23 22 26 25 27 25 24   GLUCOSE 104*   < > 108* 98 84 118* 90 88 83 92 107*  BUN 77*   < > 119* 86* 104* 122* 66* 71* 41* 59* 99*  CREATININE 5.60*   < > 8.07* 7.10* 8.62* 10.44* 7.59* 8.11* 5.45* 6.91* 8.89*  ALBUMIN 2.4*  --  2.4* 2.4*  --  2.5* 2.7*  2.6* 2.6* 2.3*  2.3*  --  2.1*  2.1*  CALCIUM 8.1*   < > 8.3* 7.8* 8.1* 8.1* 8.5* 8.5* 8.2* 8.2* 8.0*  PHOS 4.3  --  6.8* 6.3*  --  9.5* 7.5*  6.9*  --  6.8*  6.8*  --  9.1*  AST 71*  --  52* 45*  --  37 40 36 34  --  27  ALT 115*  --  26 9  --  6 7 7 8   --  8   < > = values in this interval not displayed.   Liver Function Tests: Recent Labs  Lab 06/04/18 0943 06/05/18 0238 06/06/18 0425  AST 36 34 27  ALT 7 8 8   ALKPHOS 137* 127* 112  BILITOT 1.4* 1.4* 0.8  PROT 6.1* 5.6* 5.3*  ALBUMIN 2.6* 2.3*  2.3* 2.1*  2.1*   No results for input(s):  LIPASE, AMYLASE in the last 168 hours. Recent Labs  Lab 06/04/18 0939  AMMONIA 22   CBC: Recent Labs  Lab 06/03/18 2037 06/04/18 0335 06/04/18 0943 06/05/18 1226 06/06/18 0425  WBC 10.1 12.6* 13.5* 11.7* 8.7  NEUTROABS 8.5* 10.9* 12.0*  --  6.7  HGB 7.6* 7.9* 8.0* 7.1* 6.1*  HCT 23.3* 25.0* 24.7* 22.1* 19.4*  MCV 85.3 87.1 86.1 88.0 89.4  PLT 165 176 209 232 244   Cardiac Enzymes: No results for input(s): CKTOTAL, CKMB, CKMBINDEX, TROPONINI in the last 168 hours. CBG: Recent Labs  Lab 06/05/18 1648 06/05/18 2057 06/06/18 0006 06/06/18 0345 06/06/18 0738  GLUCAP 98 96 112* 103* 100*    Iron Studies:  Recent Labs    06/03/18 1331 06/05/18 1855  IRON 55 17*  TIBC 361 321  FERRITIN 121  --    Studies/Results: No results found. Marland Kitchen  sodium chloride   Intravenous Once  . aspirin  81 mg Per Tube Daily  . B-complex with vitamin C  1 tablet Per Tube Daily  . carvedilol  6.25 mg Per NG tube BID WC  . Chlorhexidine Gluconate Cloth  6 each Topical Q0600  . darbepoetin (ARANESP) injection - DIALYSIS  100 mcg Intravenous Q Thu-HD  . famotidine  20 mg Per Tube Daily  . feeding supplement (NEPRO CARB STEADY)  1,000 mL Per Tube Q24H  . feeding supplement (PRO-STAT SUGAR FREE 64)  30 mL Per Tube BID  . folic acid  1 mg Oral Daily  . insulin aspart  0-9 Units Subcutaneous Q4H  . LORazepam  2 mg Oral TID   Followed by  . [START ON 06/07/2018] LORazepam  1 mg Oral TID   Followed by  . [START ON 06/10/2018] LORazepam  0.5 mg Oral TID  . mouth rinse  15 mL Mouth Rinse BID  . multivitamin with minerals  1 tablet Oral Daily  . QUEtiapine  50 mg Oral BID  . rosuvastatin  10 mg Oral q1800  . sodium chloride flush  3 mL Intravenous Q12H  . thiamine  100 mg Per Tube Daily  . ticagrelor  90 mg Per Tube BID  . zinc sulfate  220 mg Oral Daily    BMET    Component Value Date/Time   NA 138 06/06/2018 0425   K 5.3 (H) 06/06/2018 0425   CL 98 06/06/2018 0425   CO2 24 06/06/2018  0425   GLUCOSE 107 (H) 06/06/2018 0425   BUN 99 (H) 06/06/2018 0425   CREATININE 8.89 (H) 06/06/2018 0425   CALCIUM 8.0 (L) 06/06/2018 0425   GFRNONAA 6 (L) 06/06/2018 0425   GFRAA 7 (L) 06/06/2018 0425   CBC    Component Value Date/Time   WBC 8.7 06/06/2018 0425   RBC 2.17 (L) 06/06/2018 0425   RBC 2.17 (L) 06/06/2018 0425   HGB 6.1 (LL) 06/06/2018 0425   HCT 19.4 (L) 06/06/2018 0425   PLT 244 06/06/2018 0425   MCV 89.4 06/06/2018 0425   MCH 28.1 06/06/2018 0425   MCHC 31.4 06/06/2018 0425   RDW 22.0 (H) 06/06/2018 0425   LYMPHSABS 0.8 06/06/2018 0425   MONOABS 0.9 06/06/2018 0425   EOSABS 0.2 06/06/2018 0425   BASOSABS 0.1 06/06/2018 0425    Assessment/Plan:  1. 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 Has been HD dependent and was on CRRT until 05/30/18 and has since been tolerating intermittent HD. Last treatment was yesterday. Continue with HD qTTS for now and follow for any signs of renal recovery. Remains anuric.  Off schedule due to hyperkalemia on 06/04/18.  Plan for HD on Monday.  2. AMS- per family and staff he was more oriented and alert Thursday but since later that night he has been agitated and more confused. W/u per primary svc Pt was hypoxic and CXR with ? Bilateral Pneumonia L>R. Consider MRI per Neurology.  Doing better with precedex 3. Hyperkalemia- unclear etiology as he had HD without issues. s/p consecutive HD on 8/8 and 8/9/with improvement of potassium with low K bath and increased DFR and longer HD.  Continue to follow. 4. ABLA and anemia of critical illness- bled from HD cath site which has stopped, transfuse prn and cont with ESA on HD.  Agree with transfusion again today and possible imaging of abdomen and pelvis to r/o retroperitoneal hematoma. 5. Cardiac arrest/CAD/ICMP- s/p PCI/DES 6. Polysubstance abuse- + cocaine  7. Anoxic encephalopathy 8. HTN- cont with meds 9. Moderate protein malnutrition.  Donetta Potts, MD [employer] [phone]

## 2018-06-06 NOTE — Progress Notes (Signed)
Have been communicating w/ MRI throughout day. Have been unable to get pt down to MRI due to stat MRIs throughout day and coverage concerns. MRI tech states will instruct night shift to call RN for scheduling overnight.

## 2018-06-06 NOTE — Progress Notes (Signed)
Received call from lab, pts hgb:6.1. Spoke w/ Dr. Elsworth Soho to relay. No obvious signs of bleeding, vitals stable.

## 2018-06-06 NOTE — Progress Notes (Signed)
PULMONARY / CRITICAL CARE MEDICINE   Name: Howard Thompson MRN: [id number] DOB: 03/21/1966    ADMISSION DATE:  05/22/2018 CONSULTATION DATE: 06/05/2018  REFERRING MD: [facility]  CHIEF COMPLAINT: Post cardiac arrest.  Acute renal failure.  Delirium.  Polysubstance abuse.  HISTORY OF PRESENT ILLNESS:    52 year old with history of tobacco abuse substance abuse alcohol abuse cocaine abuse who had out of hospital arrest 05/22/2018.  He has undergone cardiac cath with LAD stent.  He had blood loss anemia secondary to HD catheter tunnel placed his require a transfusion.  His main problem is agitation and delirium and is transferred to the intensive care unit on 06/05/2018 for use of Precedex.  There is some concern of questionable retroperitoneal bleed although his abdomen is soft and nontender.  Transfused 06/06/2018 and consideration for CT of the abdomen is being entertained. SUBJECTIVE:  52 year old who continues to have anemia with no apparent blood loss.  Delirium and agitation and combative behavior continue p.o. issue  VITAL SIGNS: BP 125/73   Pulse 64   Temp 98 F (36.7 C) (Axillary)   Resp (!) 23   Ht 5\' 10"  (1.778 m)   Wt 59.3 kg   SpO2 97%   BMI 18.76 kg/m   HEMODYNAMICS:    VENTILATOR SETTINGS:    INTAKE / OUTPUT: I/O last 3 completed shifts: In: 728.1 [I.V.:178.4; NG/GT:549.7] Out: 2096 [Other:1996; Stool:100]  PHYSICAL EXAMINATION: General: 52 year old male sedated with Precedex drip Neuro: Either confused agitated or sedated no to be delirious HEENT: No JVD. Cardiovascular: Heart sounds are regular Lungs: Coarse rhonchi bilaterally, Abdomen: Soft nontender tube feedings infusing Musculoskeletal: Intact Skin: Warm and dry  LABS:  BMET Recent Labs  Lab 06/05/18 0238 06/05/18 1226 06/06/18 0425  NA 136 139 138  K 4.9 5.1 5.3*  CL 96* 99 98  CO2 27 25 24   BUN 41* 59* 99*  CREATININE 5.45* 6.91* 8.89*  GLUCOSE 83 92 107*    Electrolytes Recent Labs   Lab 06/04/18 0335 06/04/18 0943 06/05/18 0238 06/05/18 1226 06/06/18 0425  CALCIUM 8.5* 8.5* 8.2* 8.2* 8.0*  MG 2.6* 2.6* 2.3  --  2.9*  PHOS 7.5*  6.9*  --  6.8*  6.8*  --  9.1*    CBC Recent Labs  Lab 06/04/18 0943 06/05/18 1226 06/06/18 0425  WBC 13.5* 11.7* 8.7  HGB 8.0* 7.1* 6.1*  HCT 24.7* 22.1* 19.4*  PLT 209 232 244    Coag's Recent Labs  Lab 06/01/18 0251 06/01/18 2231 06/04/18 1044  APTT  --   --  33  INR 1.14 1.18 1.23    Sepsis Markers Recent Labs  Lab 06/04/18 1047 06/04/18 1228 06/05/18 0238 06/06/18 0425  LATICACIDVEN 1.0  --   --   --   PROCALCITON  --  3.00 4.85 5.34    ABG Recent Labs  Lab 06/01/18 0505 06/04/18 0924  PHART 7.348* 7.515*  PCO2ART 35.1 33.7  PO2ART 102.0 71.6*    Liver Enzymes Recent Labs  Lab 06/04/18 0943 06/05/18 0238 06/06/18 0425  AST 36 34 27  ALT 7 8 8   ALKPHOS 137* 127* 112  BILITOT 1.4* 1.4* 0.8  ALBUMIN 2.6* 2.3*  2.3* 2.1*  2.1*    Cardiac Enzymes No results for input(s): TROPONINI, PROBNP in the last 168 hours.  Glucose Recent Labs  Lab 06/05/18 1225 06/05/18 1648 06/05/18 2057 06/06/18 0006 06/06/18 0345 06/06/18 0738  GLUCAP 96 98 96 112* 103* 100*    Imaging No results found.  STUDIES:    CULTURES: 06/04/2018 blood cultures obtained  ANTIBIOTICS:   SIGNIFICANT EVENTS: 7/27 cardiac cath  LINES/TUBES: Right tunneled HD catheter>>  DISCUSSION: 52 year old with history of tobacco abuse substance abuse alcohol abuse cocaine abuse who had out of hospital arrest 05/22/2018.  He has undergone cardiac cath with LAD stent.  He had blood loss anemia secondary to HD catheter tunnel placed his require a transfusion.  His main problem is agitation and delirium and is transferred to the intensive care unit on 06/05/2018 for use of Precedex.  There is some concern of questionable retroperitoneal bleed although his abdomen is soft and nontender.  Transfused 06/06/2018 and  consideration for CT of the abdomen is being entertained.  ASSESSMENT / PLAN:  PULMONARY A: History of tobacco abuse Most likely underlying COPD No acute respiratory distress at this time 06/06/2018 P:   Oxygen as needed Continue to monitor pulmonary status Status may require intubation in the future.  CARDIOVASCULAR A:  Out-of-hospital cardiac arrest Ventricular fibrillation Acute coronary syndrome Status post cardiac catheterization 05/22/2018 with stent LAD On ASA and Brilinta for 12 months P:  Cardiology is following Hemoglobin continues to drop on antiplatelet medication  RENAL Lab Results  Component Value Date   CREATININE 8.89 (H) 06/06/2018   CREATININE 6.91 (H) 06/05/2018   CREATININE 5.45 (H) 06/05/2018   Recent Labs  Lab 06/05/18 0238 06/05/18 1226 06/06/18 0425  K 4.9 5.1 5.3*    A:   Acute renal failure P:   Currently undergoing him hemodialysis Tuesday Thursdays and Saturdays Per nephrology  GASTROINTESTINAL A:   Malnourished P:   Tube feeds Questionable retroperitoneal bleed see anemia  HEMATOLOGIC Recent Labs    06/05/18 1226 06/06/18 0425  HGB 7.1* 6.1*    A:   Anemia most likely multifactorial with recent bleeding episode from placement of tunneled HD catheter right IJ Renal failure Critical illness Poor nutritional state Polysubstance abuse p:  06/06/2018 we will transfuse 1 unit of packed cells for hemoglobin 6.1 Although abdomen is unremarkable there is question has been raised of retroperitoneal bleed I do not know if he would be cooperative enough to undergo CT of the abdomen to rule out retroperitoneal bleed.  Will discuss with MD  INFECTIOUS A:   Not currently on antibiotics T-max 06/06/2018 99.5 Blood cultures x2 ordered on 06/05/2018 P:   Trend white count and temperature curve Monitor culture data   ENDOCRINE CBG (last 3)  Recent Labs    06/06/18 0006 06/06/18 0345 06/06/18 0738  GLUCAP 112* 103* 100*     A:   No acute issues P:   Trend CBGs  NEUROLOGIC A:   Agitation post cardiac arrest History of polysubstance abuse Transfer to intensive care unit for Precedex on 06/05/2018 History of myasthenia gravis P:   RASS goal: 1 Neurology is following   FAMILY  - Updates: 06/06/2018 Sister updated at bedside.    Richardson Landry Minor ACNP [facility] PCCM Pager [phone] till 1 pm If no answer page [phone] 06/06/2018, 9:11 AM

## 2018-06-06 NOTE — Progress Notes (Signed)
Patient given second nitro. Ekg done. BP 137/83 (96) Will continue to  Monitor.

## 2018-06-06 NOTE — Progress Notes (Signed)
Patient bladder scanned and 140 ml's of urine noted in bladder. Will continue to monitor.

## 2018-06-06 NOTE — Progress Notes (Signed)
MRI called wanting patient to come down. As of now, patient would not tolerate MRI d/t moaning and constantly moving around in bed. Will call MRI when bedside nurse thinks patient can tolerate.

## 2018-06-06 NOTE — Consult Note (Signed)
[facility] Nurse wound consult note Reason for Consult:unstageable pressure injury to chin.  Previous C collar use, no longer in place. Patient has beard and this is a crusted scabbed lesion at this time.  No need for further intervention Wound type:device related unstageable pressure injury Pressure Injury POA: Yes Measurement:0.3 cm x 0.5 cm scabbed Wound UOH:[id number] Drainage (amount, consistency, odor) none Periwound: Dressing procedure/placement/frequency:OPen to air.  Will not follow at this time.  Please re-consult if needed.  Domenic Moras RN BSN [place] Pager [phone]

## 2018-06-07 ENCOUNTER — Inpatient Hospital Stay (HOSPITAL_COMMUNITY): Payer: No Typology Code available for payment source

## 2018-06-07 DIAGNOSIS — D649 Anemia, unspecified: Secondary | ICD-10-CM

## 2018-06-07 LAB — PREPARE RBC (CROSSMATCH)

## 2018-06-07 LAB — CBC WITH DIFFERENTIAL/PLATELET
Abs Immature Granulocytes: 0.1 10*3/uL (ref 0.0–0.1)
BASOS PCT: 0 %
Basophils Absolute: 0 10*3/uL (ref 0.0–0.1)
EOS ABS: 0.1 10*3/uL (ref 0.0–0.7)
Eosinophils Relative: 1 %
HCT: 23 % — ABNORMAL LOW (ref 39.0–52.0)
Hemoglobin: 7.6 g/dL — ABNORMAL LOW (ref 13.0–17.0)
IMMATURE GRANULOCYTES: 1 %
Lymphocytes Relative: 5 %
Lymphs Abs: 0.6 10*3/uL — ABNORMAL LOW (ref 0.7–4.0)
MCH: 28.5 pg (ref 26.0–34.0)
MCHC: 33 g/dL (ref 30.0–36.0)
MCV: 86.1 fL (ref 78.0–100.0)
Monocytes Absolute: 0.8 10*3/uL (ref 0.1–1.0)
Monocytes Relative: 7 %
NEUTROS ABS: 11.1 10*3/uL — AB (ref 1.7–7.7)
NEUTROS PCT: 86 %
Platelets: 289 10*3/uL (ref 150–400)
RBC: 2.67 MIL/uL — AB (ref 4.22–5.81)
RDW: 20.8 % — ABNORMAL HIGH (ref 11.5–15.5)
WBC: 12.8 10*3/uL — ABNORMAL HIGH (ref 4.0–10.5)

## 2018-06-07 LAB — BASIC METABOLIC PANEL
Anion gap: 17 — ABNORMAL HIGH (ref 5–15)
BUN: 156 mg/dL — AB (ref 6–20)
CHLORIDE: 99 mmol/L (ref 98–111)
CO2: 20 mmol/L — AB (ref 22–32)
CREATININE: 11.04 mg/dL — AB (ref 0.61–1.24)
Calcium: 8 mg/dL — ABNORMAL LOW (ref 8.9–10.3)
GFR calc Af Amer: 5 mL/min — ABNORMAL LOW (ref >60)
GFR calc non Af Amer: 5 mL/min — ABNORMAL LOW (ref >60)
GLUCOSE: 119 mg/dL — AB (ref 70–99)
POTASSIUM: 5.8 mmol/L — AB (ref 3.5–5.1)
Sodium: 136 mmol/L (ref 135–145)

## 2018-06-07 LAB — RENAL FUNCTION PANEL
ALBUMIN: 2.3 g/dL — AB (ref 3.5–5.0)
Anion gap: 21 — ABNORMAL HIGH (ref 5–15)
BUN: 160 mg/dL — AB (ref 6–20)
CHLORIDE: 97 mmol/L — AB (ref 98–111)
CO2: 19 mmol/L — ABNORMAL LOW (ref 22–32)
Calcium: 8.1 mg/dL — ABNORMAL LOW (ref 8.9–10.3)
Creatinine, Ser: 11.3 mg/dL — ABNORMAL HIGH (ref 0.61–1.24)
GFR calc non Af Amer: 5 mL/min — ABNORMAL LOW (ref >60)
GFR, EST AFRICAN AMERICAN: 5 mL/min — AB (ref >60)
Glucose, Bld: 121 mg/dL — ABNORMAL HIGH (ref 70–99)
PHOSPHORUS: 10.3 mg/dL — AB (ref 2.5–4.6)
POTASSIUM: 6.2 mmol/L — AB (ref 3.5–5.1)
Sodium: 137 mmol/L (ref 135–145)

## 2018-06-07 LAB — PHOSPHORUS: Phosphorus: 9.6 mg/dL — ABNORMAL HIGH (ref 2.5–4.6)

## 2018-06-07 LAB — HEPATIC FUNCTION PANEL
ALT: 12 U/L (ref 0–44)
AST: 39 U/L (ref 15–41)
Albumin: 2.3 g/dL — ABNORMAL LOW (ref 3.5–5.0)
Alkaline Phosphatase: 144 U/L — ABNORMAL HIGH (ref 38–126)
BILIRUBIN DIRECT: 0.3 mg/dL — AB (ref 0.0–0.2)
BILIRUBIN INDIRECT: 0.7 mg/dL (ref 0.3–0.9)
TOTAL PROTEIN: 5.7 g/dL — AB (ref 6.5–8.1)
Total Bilirubin: 1 mg/dL (ref 0.3–1.2)

## 2018-06-07 LAB — CBC
HEMATOCRIT: 23.1 % — AB (ref 39.0–52.0)
Hemoglobin: 7.5 g/dL — ABNORMAL LOW (ref 13.0–17.0)
MCH: 28.1 pg (ref 26.0–34.0)
MCHC: 32.5 g/dL (ref 30.0–36.0)
MCV: 86.5 fL (ref 78.0–100.0)
PLATELETS: 300 10*3/uL (ref 150–400)
RBC: 2.67 MIL/uL — AB (ref 4.22–5.81)
RDW: 21 % — AB (ref 11.5–15.5)
WBC: 13.6 10*3/uL — ABNORMAL HIGH (ref 4.0–10.5)

## 2018-06-07 LAB — FOLATE RBC
FOLATE, HEMOLYSATE: 412.7 ng/mL
FOLATE, RBC: 1834 ng/mL (ref >498)
HEMATOCRIT: 22.5 % — AB (ref 37.5–51.0)

## 2018-06-07 LAB — GLUCOSE, CAPILLARY
GLUCOSE-CAPILLARY: 111 mg/dL — AB (ref 70–99)
GLUCOSE-CAPILLARY: 106 mg/dL — AB (ref 70–99)
GLUCOSE-CAPILLARY: 102 mg/dL — AB (ref 70–99)
Glucose-Capillary: 111 mg/dL — ABNORMAL HIGH (ref 70–99)
Glucose-Capillary: 107 mg/dL — ABNORMAL HIGH (ref 70–99)
Glucose-Capillary: 100 mg/dL — ABNORMAL HIGH (ref 70–99)

## 2018-06-07 LAB — HEMOGLOBIN AND HEMATOCRIT, BLOOD
HEMATOCRIT: 22.7 % — AB (ref 39.0–52.0)
HEMATOCRIT: 24.1 % — AB (ref 39.0–52.0)
Hemoglobin: 7.5 g/dL — ABNORMAL LOW (ref 13.0–17.0)
Hemoglobin: 8 g/dL — ABNORMAL LOW (ref 13.0–17.0)

## 2018-06-07 LAB — MAGNESIUM: Magnesium: 3.3 mg/dL — ABNORMAL HIGH (ref 1.7–2.4)

## 2018-06-07 MED ORDER — ORAL CARE MOUTH RINSE
15.0000 mL | Freq: Two times a day (BID) | OROMUCOSAL | Status: DC
  Administered 2018-06-07 – 2018-06-15 (×4): 15 mL via OROMUCOSAL

## 2018-06-07 MED ORDER — SODIUM CHLORIDE 0.9% IV SOLUTION: Freq: Once | INTRAVENOUS | Status: DC

## 2018-06-07 MED ORDER — SODIUM CHLORIDE 0.9% IV SOLUTION
Freq: Once | INTRAVENOUS | Status: AC
  Administered 2018-06-07: 15:00:00 via INTRAVENOUS

## 2018-06-07 MED ORDER — CHLORHEXIDINE GLUCONATE 0.12 % MT SOLN
15.0000 mL | Freq: Two times a day (BID) | OROMUCOSAL | Status: DC
  Administered 2018-06-07 – 2018-06-15 (×11): 15 mL via OROMUCOSAL
  Filled 2018-06-07 (×9): qty 15

## 2018-06-07 MED ORDER — SODIUM CHLORIDE 0.9 % IV SOLN
510.0000 mg | INTRAVENOUS | Status: AC
  Administered 2018-06-07 – 2018-06-14 (×2): 510 mg via INTRAVENOUS
  Filled 2018-06-07 (×3): qty 17

## 2018-06-07 MED ORDER — DARBEPOETIN ALFA 200 MCG/0.4ML IJ SOSY
200.0000 ug | PREFILLED_SYRINGE | INTRAMUSCULAR | Status: DC
  Filled 2018-06-07: qty 0.4

## 2018-06-07 NOTE — Progress Notes (Signed)
Recent Labs  Lab 06/06/18 0425 06/06/18 1749 06/07/18 0334 06/07/18 0652 06/07/18 1411  HGB 6.1* 7.1* 7.6* 7.5* 7.5*     Hgb flat after 1 unit prbc No bleed per RN that is obvious  [place] bp/hr per RN  Plan 1 more unit PRBC   Dr. Brand Males, M.D., [facility]C.P Pulmonary and Critical Care Medicine Staff Physician, [place] Director - Interstitial Lung Disease  Program  Pulmonary [place] [facility] [place], [place], [address]  Pager: [phone], If no answer or between  15:00h - 7:00h: call [phone] Telephone: [phone]

## 2018-06-07 NOTE — Progress Notes (Signed)
  Speech Language Pathology Treatment: Dysphagia  Patient Details Name: Howard Thompson MRN: [id number] DOB: [birth year] Today's Date: 06/07/2018 Time: 4315-4008 SLP Time Calculation (min) (ACUTE ONLY): 22 min  Assessment / Plan / Recommendation Clinical Impression  Pt is drowsy today after just completing dialysis. He awakens with upright positioning, completion of oral care, and Mod cues for sustained attention. His mentation impacts his oral acceptance, also with loss of entire ice chip x1. No overt signs of aspiration are observed, but he has multiple subswallows with small amounts of water by cup. Pt had small amounts of POs before declining the rest, falling back asleep before SLP had exited the room. Recommend allowing a few ice chips at a time after oral care when pt is fully alert. Would allow this to moisten his oral mucosa and support use of his hyolaryngeal musculature, but would hold on additional POs pending improved mentation. Will continue to follow.   HPI HPI: Howard Thompson is a 52 y.o. male admitted on 7/27 s/p unwitnessed out of hospital cardiac arrest, VF, unknown downtime, past medical history of coronary artery disease, myasthenia gravis, hypertension.  Per wife, pt has had hx of dysphagia related to MG which is largely resolved.  Pt was intubated from 7/27 - 8/2.      SLP Plan  Continue with current plan of care       Recommendations  Diet recommendations: NPO;Other(comment)(except few ice chips after oral care) Medication Administration: Via alternative means                Oral Care Recommendations: Oral care QID Follow up Recommendations: Skilled Nursing facility SLP Visit Diagnosis: Dysphagia, oropharyngeal phase (R13.12) Plan: Continue with current plan of care       GO                Germain Osgood 06/07/2018, 12:57 PM  Germain Osgood, M.A. CCC-SLP [phone]

## 2018-06-07 NOTE — Progress Notes (Signed)
Pt unable to get MRI of brain overnight due to agitation. Precedex now on max dose of 1.2. Pt still agitated requiring max dose. Would need additional medication for MRI. Relayed to Dr. Chase Caller. Have paged neuro to discuss but unable to reach anyone at this time.

## 2018-06-07 NOTE — Progress Notes (Signed)
Received call from Neurology re:MRI for pt.  Relayed that pt was unable to go to MRI yesterday due to many stat cases, and unable to go last night and today due to agitation and HD. Further relayed that I was instructed by Neuro NP yesterday not to give pt ativan. Per Neurologist, if ativan needs to be given in order for pt to receive MRI, then we may administer.

## 2018-06-07 NOTE — Progress Notes (Signed)
PULMONARY / CRITICAL CARE MEDICINE   Name: Howard Thompson MRN: [id number] DOB: November 17, 1965    ADMISSION DATE:  05/22/2018 CONSULTATION DATE: 06/05/2018 LOS 16 days  REFERRING MD: [facility]  CHIEF COMPLAINT: Post cardiac arrest.  Acute renal failure.  Delirium.  Polysubstance abuse. BRIEF  52 year old with history of tobacco abuse substance abuse alcohol abuse cocaine abuse who had out of hospital arrest 05/22/2018.  He has undergone cardiac cath with LAD stent.  He had blood loss anemia secondary to HD catheter tunnel placed his require a transfusion.  His main problem is agitation and delirium and is transferred to the intensive care unit on 06/05/2018 for use of Precedex.  There is some concern of questionable retroperitoneal bleed although his abdomen is soft and nontender.  Transfused 06/06/2018 and consideration for CT of the abdomen is being entertained.    STUDIES:    CULTURES: 06/04/2018 blood cultures obtained  ANTIBIOTICS:   SIGNIFICANT EVENTS: 7/27 cardiac cath  LINES/TUBES: Right tunneled HD catheter>>    EVENTS 06/06/18 - 52 year old who continues to have anemia with no apparent blood loss.  Delirium and agitation and combative behavior continue p.o. Issue   SUBJECTIVE/OVERNIGHT/INTERVAL HX 8/12 - on precedex. Still needing it due to agitated delirium. Not on vent on Room air. On precedex 1.2. Neuro wanting MRI - but agiation interfering. Anuria continues - on iHD - MWF. Currently on HD. S/p PRBC yesterday. No active bleeding   VITAL SIGNS: BP 118/64   Pulse 76   Temp (!) 97.3 F (36.3 C) (Axillary)   Resp 19   Ht 5\' 10"  (1.778 m)   Wt 58.3 kg   SpO2 95%   BMI 18.44 kg/m   HEMODYNAMICS:    VENTILATOR SETTINGS:    INTAKE / OUTPUT: I/O last 3 completed shifts: In: 2498.3 [I.V.:469.8; Blood:315; Other:120; NG/GT:1593.5] Out: 150 [Stool:150]  PHYSICAL EXAMINATION:  General Appearance:    Looks emaciated, deconditioned, Has HD running  Head:     Normocephalic, without obvious abnormality, atraumatic  Eyes:    PERRL - yes, conjunctiva/corneas - clear      Ears:    Normal external ear canals, both ears  Nose:   NG tube -  On TF   Throat:  ETT TUBE - no , OG tube - no  Neck:   Supple,  No enlargement/tenderness/nodules     Lungs:     Clear to auscultation bilaterally,   Chest wall:    No deformity. Rt permacath on chest  Heart:    S1 and S2 normal, no murmur, CVP - no.  Pressors - no  Abdomen:     Soft, no masses, no organomegaly  Genitalia:    Not done  Rectal:   not done  Extremities:   Extremities- intact but emaciated     Skin:   Intact in exposed areas .     Neurologic:   Sedation - precedex -> RASS - +1 to +2 . Moves all 4s - yes. CAM-ICU - positive for delirum . Orientation - not oriented but tracks and mubles      PULMONARY Recent Labs  Lab 06/01/18 0505 06/04/18 0924  PHART 7.348* 7.515*  PCO2ART 35.1 33.7  PO2ART 102.0 71.6*  HCO3 19.3* 27.0  TCO2 20*  --   O2SAT 98.0 95.1    CBC Recent Labs  Lab 06/06/18 1749 06/07/18 0334 06/07/18 0652  HGB 7.1* 7.6* 7.5*  HCT 22.0* 23.0* 23.1*  WBC 9.7 12.8* 13.6*  PLT 253 289 300  COAGULATION Recent Labs  Lab 05/31/18 1003 06/01/18 0251 06/01/18 2231 06/04/18 1044  INR 1.10 1.14 1.18 1.23    CARDIAC  No results for input(s): TROPONINI in the last 168 hours. No results for input(s): PROBNP in the last 168 hours.   CHEMISTRY Recent Labs  Lab 06/04/18 0335 06/04/18 4166 06/05/18 0238 06/05/18 1226 06/06/18 0425 06/07/18 0334 06/07/18 0652  NA 138 138 136 139 138 136 137  K 6.0* 6.2* 4.9 5.1 5.3* 5.8* 6.2*  CL 96* 95* 96* 99 98 99 97*  CO2 26 25 27 25 24  20* 19*  GLUCOSE 90 88 83 92 107* 119* 121*  BUN 66* 71* 41* 59* 99* 156* 160*  CREATININE 7.59* 8.11* 5.45* 6.91* 8.89* 11.04* 11.30*  CALCIUM 8.5* 8.5* 8.2* 8.2* 8.0* 8.0* 8.1*  MG 2.6* 2.6* 2.3  --  2.9* 3.3*  --   PHOS 7.5*  6.9*  --  6.8*  6.8*  --  9.1* 9.6* 10.3*   Estimated  Creatinine Clearance: 6.4 mL/min (A) (by C-G formula based on SCr of 11.3 mg/dL (H)).   LIVER Recent Labs  Lab 05/31/18 1003 06/01/18 0251 06/01/18 2231  06/04/18 0335 06/04/18 0943 06/04/18 1044 06/05/18 0238 06/06/18 0425 06/07/18 0334 06/07/18 0652  AST  --  52*  --    < > 40 36  --  34 27 39  --   ALT  --  26  --    < > 7 7  --  8 8 12   --   ALKPHOS  --  150*  --    < > 134* 137*  --  127* 112 144*  --   BILITOT  --  1.2  --    < > 1.3* 1.4*  --  1.4* 0.8 1.0  --   PROT  --  5.6*  --    < > 5.9* 6.1*  --  5.6* 5.3* 5.7*  --   ALBUMIN  --  2.4*  --    < > 2.7*  2.6* 2.6*  --  2.3*  2.3* 2.1*  2.1* 2.3* 2.3*  INR 1.10 1.14 1.18  --   --   --  1.23  --   --   --   --    < > = values in this interval not displayed.     INFECTIOUS Recent Labs  Lab 06/04/18 1047 06/04/18 1228 06/05/18 0238 06/06/18 0425  LATICACIDVEN 1.0  --   --   --   PROCALCITON  --  3.00 4.85 5.34     ENDOCRINE CBG (last 3)  Recent Labs    06/06/18 2353 06/07/18 0416 06/07/18 0751  GLUCAP 103* 111* 111*         IMAGING x48h  - image(s) personally visualized  -   highlighted in bold Ct Abdomen Pelvis Wo Contrast  Result Date: 06/06/2018 CLINICAL DATA:  Anemia. EXAM: CT ABDOMEN AND PELVIS WITHOUT CONTRAST TECHNIQUE: Multidetector CT imaging of the abdomen and pelvis was performed following the standard protocol without IV contrast. COMPARISON:  None. FINDINGS: Lower chest: Bilateral posterior lower lobe consolidation/atelectasis. Hepatobiliary: Unenhanced appearance of the liver is unremarkable. The gallbladder appears to be contracted. Pancreas: Unremarkable. No pancreatic ductal dilatation or surrounding inflammatory changes. Spleen: Normal in size without focal abnormality. Adrenals/Urinary Tract: Unenhanced appearance of the adrenal glands and kidneys are unremarkable. No hydronephrosis. The bladder appears unremarkable. Stomach/Bowel: Feeding tube extends into the duodenum with the tip  at the juncture of the second and  third portions. Visualized bowel shows no evidence of obstruction or ileus. No inflammation identified. Rectal tube present. Vascular/Lymphatic: Calcified abdominal aorta without evidence of aneurysmal disease. 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 Reproductive: Prostate is unremarkable. Other: No evidence of retroperitoneal or intraperitoneal hemorrhage. No abnormal fluid collections or ascites. No hernias identified. Musculoskeletal: No acute or significant osseous findings. IMPRESSION: 1. No evidence of intraperitoneal or retroperitoneal hemorrhage. 2. No acute findings in the abdomen or pelvis by unenhanced CT. 3. Dilatation of the left common femoral artery up to 1.9 cm in maximal diameter. Electronically Signed   By: Aletta Edouard M.D.   On: 06/06/2018 13:42   Dg Chest Port 1 View  Result Date: 06/07/2018 CLINICAL DATA:  Respiratory failure and shortness of breath. EXAM: PORTABLE CHEST 1 VIEW COMPARISON:  06/04/2018 and prior radiographs FINDINGS: Cardiomegaly, small bore feeding tube entering the stomach with tip off the field of view and RIGHT central venous catheter with tip overlying the SUPERIOR cavoatrial junction noted. Bilateral LOWER lung opacities/airspace disease again noted, slightly improved on the LEFT. No pneumothorax or large pleural effusion identified. IMPRESSION: Bilateral LOWER lung opacities/airspace disease, slightly improved on the LEFT. Feeding tube placement. No other significant change. Electronically Signed   By: Margarette Canada M.D.   On: 06/07/2018 07:14   DISCUSSION: 52 year old with history of tobacco abuse substance abuse alcohol abuse cocaine abuse who had out of hospital arrest 05/22/2018.  He has undergone cardiac cath with LAD stent.  He had blood loss anemia secondary to HD catheter tunnel placed his require a transfusion.  His main problem is agitation and delirium and is  transferred to the intensive care unit on 06/05/2018 for use of Precedex.  There is some concern of questionable retroperitoneal bleed although his abdomen is soft and nontender.  Transfused 06/06/2018 and consideration for CT of the abdomen is being entertained.  ASSESSMENT / PLAN:  PULMONARY A: History of tobacco abuse Most likely underlying COPD   - maintains resp status 06/07/2018   P:   Oxygen as needed Continue to monitor pulmonary status Status may require intubation in the future.  CARDIOVASCULAR A:  Out-of-hospital cardiac arrest Ventricular fibrillation Acute coronary syndrome Status post cardiac catheterization 05/22/2018 with stent LAD On ASA and Brilinta for 12 months  - mantains bp/hr   P:  Cardiology is following   RENAL Lab Results  Component Value Date   CREATININE 11.30 (H) 06/07/2018   CREATININE 11.04 (H) 06/07/2018   CREATININE 8.89 (H) 06/06/2018   Recent Labs  Lab 06/06/18 0425 06/07/18 0334 06/07/18 0652  K 5.3* 5.8* 6.2*    A:   Acute renal failure with anuria. On 1HD MWF  P:   Per RENAL  GASTROINTESTINAL A:   Malnourished  P:   Tube feeds   HEMATOLOGIC Recent Labs    06/07/18 0334 06/07/18 0652  HGB 7.6* 7.5*    A:   Anemia most likely multifactorial with recent bleeding episode from placement of tunneled HD catheter right IJ and DAPT. No RP bleed on CT 06/06/18   - s/p 1 unit PRBC 05/25/18, 06/02/18, 06/03/18 , 06/06/18   p:  - PRBC ACS susepcted/confirmed then transfuse for hgb </= 8.0gm%,  or    -  active bleeding with hemodynamic instability, then transfuse regardless of hemoglobin value   At at all times try to transfuse 1 unit prbc as possible with exception of active hemorrhage  - give another U  PRBC 06/07/2018    INFECTIOUS A:   Blood cultures x2 ordered on 06/05/2018  06/07/2018 - not on abx  P:   monitor   ENDOCRINE CBG (last 3)  Recent Labs    06/06/18 2353 06/07/18 0416 06/07/18 0751  GLUCAP  103* 111* 111*    A:   No acute issues P:   Trend CBGs  NEUROLOGIC A: History of polysubstance abuse   History of myasthenia gravis   Agitation post cardiac arrest. Transfer to intensive care unit for Precedex on 06/05/2018   06/07/2018 - ongoing agitated delirum needing precedex. Worse at night. Unable to get MRI   P:   Precedex gtt Can dol haldol prn if QTc allows RASS goal: 1 Neurology is following Await MRI but question utility in metabolic encephalopathy   FAMILY  - Updates: 06/06/2018 Sister updated at bedside. None at bedside 06/07/2018     The patient is critically ill with multiple organ systems failure and requires high complexity decision making for assessment and support, frequent evaluation and titration of therapies, application of advanced monitoring technologies and extensive interpretation of multiple databases.   Critical Care Time devoted to patient care services described in this note is  30  Minutes. This time reflects time of care of this signee Dr Brand Males. This critical care time does not reflect procedure time, or teaching time or supervisory time of PA/NP/Med student/Med Resident etc but could involve care discussion time    Dr. Brand Males, M.D., [facility].C.P Pulmonary and Critical Care Medicine Staff Physician [place] [facility] Pager: [phone], If no answer or between  15:00h - 7:00h: call [phone]  06/07/2018 10:23 AM

## 2018-06-07 NOTE — Progress Notes (Addendum)
Progress Note  Patient Name: Howard Thompson Date of Encounter: 06/07/2018  Primary Cardiologist: No primary care provider on file.  Subjective   Poor mentation, does not respond to questions.   Inpatient Medications    Scheduled Meds: . aspirin  81 mg Per Tube Daily  . B-complex with vitamin C  1 tablet Per Tube Daily  . carvedilol  6.25 mg Per NG tube BID WC  . Chlorhexidine Gluconate Cloth  6 each Topical Q0600  . [START ON 06/10/2018] darbepoetin (ARANESP) injection - DIALYSIS  200 mcg Intravenous Q Thu-HD  . famotidine  20 mg Per Tube Daily  . feeding supplement (NEPRO CARB STEADY)  1,000 mL Per Tube Q24H  . feeding supplement (PRO-STAT SUGAR FREE 64)  30 mL Per Tube BID  . folic acid  1 mg Oral Daily  . insulin aspart  0-9 Units Subcutaneous Q4H  . LORazepam  2 mg Oral TID   Followed by  . LORazepam  1 mg Oral TID   Followed by  . [START ON 06/10/2018] LORazepam  0.5 mg Oral TID  . mouth rinse  15 mL Mouth Rinse BID  . multivitamin with minerals  1 tablet Oral Daily  . QUEtiapine  50 mg Oral BID  . rosuvastatin  10 mg Oral q1800  . sodium chloride flush  3 mL Intravenous Q12H  . thiamine  100 mg Per Tube Daily  . ticagrelor  90 mg Per Tube BID  . zinc sulfate  220 mg Oral Daily   Continuous Infusions: . sodium chloride 250 mL (06/05/18 2138)  . dexmedetomidine (PRECEDEX) IV infusion 1.2 mcg/kg/hr (06/07/18 0600)  . ferumoxytol 510 mg (06/07/18 0926)   PRN Meds: sodium chloride, albuterol, docusate, Gerhardt's butt cream, hydrALAZINE, labetalol, LORazepam, nitroGLYCERIN, sodium chloride flush   Vital Signs    Vitals:   06/07/18 0900 06/07/18 0915 06/07/18 0930 06/07/18 0945  BP: 125/77 125/71 135/66 118/64  Pulse: 70 81 71 76  Resp: 19 16 16 19   Temp:      TempSrc:      SpO2: 98% 93% 97% 95%  Weight:      Height:        Intake/Output Summary (Last 24 hours) at 06/07/2018 1010 Last data filed at 06/07/2018 0900 Gross per 24 hour  Intake 1981.01 ml    Output 50 ml  Net 1931.01 ml   Filed Weights   06/06/18 0301 06/07/18 0500 06/07/18 0654  Weight: 59.3 kg 60.1 kg 58.3 kg    Telemetry    SR - Personally Reviewed  Physical Exam   General:pale, thin W male appearing in no acute distress. Head: Normocephalic, atraumatic.  Neck: Supple, no JVD. Lungs:  Resp regular and unlabored, course BS bilaterally. Heart: RRR, S1, S2, sytolic murmur; no rub. Abdomen: Soft, non-tender, non-distended with normoactive bowel sounds.  Extremities: No clubbing, cyanosis, edema. Distal pedal pulses are 2+ bilaterally. Neuro: Poor mentation, does not respond to questions. Moves all extremities spontaneously.  Labs    Chemistry Recent Labs  Lab 06/05/18 0238  06/06/18 0425 06/07/18 0334 06/07/18 0652  NA 136   < > 138 136 137  K 4.9   < > 5.3* 5.8* 6.2*  CL 96*   < > 98 99 97*  CO2 27   < > 24 20* 19*  GLUCOSE 83   < > 107* 119* 121*  BUN 41*   < > 99* 156* 160*  CREATININE 5.45*   < > 8.89* 11.04* 11.30*  CALCIUM  8.2*   < > 8.0* 8.0* 8.1*  PROT 5.6*  --  5.3* 5.7*  --   ALBUMIN 2.3*  2.3*  --  2.1*  2.1* 2.3* 2.3*  AST 34  --  27 39  --   ALT 8  --  8 12  --   ALKPHOS 127*  --  112 144*  --   BILITOT 1.4*  --  0.8 1.0  --   GFRNONAA 11*   < > 6* 5* 5*  GFRAA 13*   < > 7* 5* 5*  ANIONGAP 13   < > 16* 17* 21*   < > = values in this interval not displayed.     Hematology Recent Labs  Lab 06/06/18 1749 06/07/18 0334 06/07/18 0652  WBC 9.7 12.8* 13.6*  RBC 2.52* 2.67* 2.67*  HGB 7.1* 7.6* 7.5*  HCT 22.0* 23.0* 23.1*  MCV 87.3 86.1 86.5  MCH 28.2 28.5 28.1  MCHC 32.3 33.0 32.5  RDW 20.7* 20.8* 21.0*  PLT 253 289 300    Cardiac EnzymesNo results for input(s): TROPONINI in the last 168 hours. No results for input(s): TROPIPOC in the last 168 hours.   BNPNo results for input(s): BNP, PROBNP in the last 168 hours.   DDimer No results for input(s): DDIMER in the last 168 hours.    Radiology    Ct Abdomen Pelvis Wo  Contrast  Result Date: 06/06/2018 CLINICAL DATA:  Anemia. EXAM: CT ABDOMEN AND PELVIS WITHOUT CONTRAST TECHNIQUE: Multidetector CT imaging of the abdomen and pelvis was performed following the standard protocol without IV contrast. COMPARISON:  None. FINDINGS: Lower chest: Bilateral posterior lower lobe consolidation/atelectasis. Hepatobiliary: Unenhanced appearance of the liver is unremarkable. The gallbladder appears to be contracted. Pancreas: Unremarkable. No pancreatic ductal dilatation or surrounding inflammatory changes. Spleen: Normal in size without focal abnormality. Adrenals/Urinary Tract: Unenhanced appearance of the adrenal glands and kidneys are unremarkable. No hydronephrosis. The bladder appears unremarkable. Stomach/Bowel: Feeding tube extends into the duodenum with the tip at the juncture of the second and third portions. Visualized bowel shows no evidence of obstruction or ileus. No inflammation identified. Rectal tube present. Vascular/Lymphatic: Calcified abdominal aorta without evidence of aneurysmal disease. 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 Reproductive: Prostate is unremarkable. Other: No evidence of retroperitoneal or intraperitoneal hemorrhage. No abnormal fluid collections or ascites. No hernias identified. Musculoskeletal: No acute or significant osseous findings. IMPRESSION: 1. No evidence of intraperitoneal or retroperitoneal hemorrhage. 2. No acute findings in the abdomen or pelvis by unenhanced CT. 3. Dilatation of the left common femoral artery up to 1.9 cm in maximal diameter. Electronically Signed   By: Aletta Edouard M.D.   On: 06/06/2018 13:42   Dg Chest Port 1 View  Result Date: 06/07/2018 CLINICAL DATA:  Respiratory failure and shortness of breath. EXAM: PORTABLE CHEST 1 VIEW COMPARISON:  06/04/2018 and prior radiographs FINDINGS: Cardiomegaly, small bore feeding tube entering the stomach with tip off  the field of view and RIGHT central venous catheter with tip overlying the SUPERIOR cavoatrial junction noted. Bilateral LOWER lung opacities/airspace disease again noted, slightly improved on the LEFT. No pneumothorax or large pleural effusion identified. IMPRESSION: Bilateral LOWER lung opacities/airspace disease, slightly improved on the LEFT. Feeding tube placement. No other significant change. Electronically Signed   By: Margarette Canada M.D.   On: 06/07/2018 07:14    Cardiac Studies   Cath: 05/22/18    Ost LAD to Prox LAD lesion  is 100% stenosed.  A drug-eluting stent was successfully placed using a STENT SYNERGY DES 3X20.  Post intervention, there is a 0% residual stenosis.  There is severe left ventricular systolic dysfunction.  LV end diastolic pressure is mildly elevated.  The left ventricular ejection fraction is less than 25% by visual estimate.   TTE: 05/23/18  Study Conclusions  - Left ventricle: The cavity size was normal. Systolic function was   severely reduced. The estimated ejection fraction was in the   range of 25% to 30%. Severe diffuse hypokinesis with regional   variations. There is akinesis of the apicalanterior and apical   myocardium. There is akinesis of the midanteroseptal myocardium.   There is severe hypokinesis of the inferoseptal myocardium.   Features are consistent with a pseudonormal left ventricular   filling pattern, with concomitant abnormal relaxation and   increased filling pressure (grade 2 diastolic dysfunction).   Doppler parameters are consistent with high ventricular filling   pressure. - Aortic valve: A bicuspid morphology cannot be excluded; mildly   thickened, mildly calcified leaflets. There was mild to moderate   regurgitation. Regurgitation pressure half-time: 718 ms. - Mitral valve: There was mild to moderate regurgitation. Valve   area by pressure half-time: 2.39 cm^2. - Right ventricle: Systolic function was moderately  reduced. - Atrial septum: There was increased thickness of the septum,   consistent with lipomatous hypertrophy. - Tricuspid valve: There was mild regurgitation. - Pulmonary arteries: PA peak pressure: 33 mm Hg (S). - Inferior vena cava: The vessel was dilated. The respirophasic   diameter changes were blunted (< 50%), consistent with elevated   central venous pressure. - Pericardium, extracardiac: A small, free-flowing pericardial   effusion was identified circumferential to the heart. The fluid   had no internal echoes.There was no evidence of hemodynamic   compromise.  Impressions:  - severe LV dysfunction with diffuse HK but apica/anteroseptal and   apical anterior AK. Recommend definity contrast study to rule out   apical thrombus.  Patient Profile     52 y.o. male with an out of hospital cardiac arrest, prior history of MI several years ago, myasthenia gravis post thymus resection, anterior ST elevation post LAD stent. Extubated on 8/2.  Assessment & Plan    1. Cardiac arrest -Underwent cath with PCI/DES x1 to the pLADon 05/22/18. -Continue  DAPT with ASA/Brilinta for 12 months.   -Currently not taking meds due to acute delirium -CCM as ordered NG tube  -ASAwasheld in the setting of thrombocytopenia, but has since been resumed.Platelet count improved.  -Statin held 2/2 to elevated LFTs. LFTs have normalized.  -Continue Crestor 10 mg daily, ASA 81 mg and Brilinta 90 mg twice daily  per NG tube as he is not taking p.o. due to delirium  -HTN and tachycardia resolved with treatment of agitation   -in light of h/o cocaine use, lopressor changed to carvedilol 6.25mg  BID per NGT  2.  ICM -EF noted at 25-30%.  -No room for ARB/ACEi given AKF -continue carvedilol  3. Anoxic encephalopathy - EEG was abnormal but repeat now improving. -Now with acute delirium and agitation -Neuro following and on Seroquel/Ativan, and precedex gtt  4. Acute renal failure: -Nephrology  following.  -S/p placement of new dialysis catheter right subclavian.  -Creatinine was 11.30 and potassium 6.2 and receiving dialysis.   -per nephrology  5. Polysubstance abuse:+ for cocaine on admission.   6. Acute blood loss anemia felt to be 2/2 to bleeding HD cath: - CT  abd negative for acute bleed - Hgb maintaining around 7. Receiving IV iron today.  - Ideally would like Hgb above 8 given coronary issues but need to balance with volume issues. Will monitor Hgb with dialysis.   7.  Systolic heart murmur at the apex -2D echo 05/23/2018 showed mild to moderate mitral regurgitation and mild to moderate aortic insufficiency   Signed, Reino Bellis, NP  06/07/2018, 10:10 AM  Pager # [phone]   For questions or updates, please contact [place] Please consult [email or web address] for contact info under Cardiology/STEMI.   Patient seen and examined. Agree with assessment and plan. On precedex for agitiation.  Cardiac status appears stable with normal hemodynamics. Sinus rhythm in the 70s; stable BP. Anemic, receiving 3rd unit PRBC since yesterday. Dialyzed today. No overt bleeding.    Troy Sine, MD, [facility] 06/07/2018 5:17 PM

## 2018-06-07 NOTE — Progress Notes (Signed)
Subjective: Interval History: has complaints wants water,  .  Objective: Vital signs in last 24 hours: Temp:  [97.7 F (36.5 C)-99.5 F (37.5 C)] 98.4 F (36.9 C) (08/12 0419) Pulse Rate:  [61-97] 85 (08/12 0654) Resp:  [17-33] 18 (08/12 0600) BP: (119-169)/(64-101) 157/101 (08/12 0654) SpO2:  [96 %-100 %] 98 % (08/12 0600) Weight:  [58.3 kg-60.1 kg] 58.3 kg (08/12 0654) Weight change: 0.8 kg  Intake/Output from previous day: 08/11 0701 - 08/12 0700 In: 1729 [I.V.:219.3; Blood:315; NG/GT:1084.7] Out: 50 [Stool:50] Intake/Output this shift: No intake/output data recorded.  General appearance: pale, slowed mentation, uncooperative and not appropriate, not responding to commands, awakes Resp: rales bibasilar Chest wall: RIJ cath Cardio: S1, S2 normal and systolic murmur: systolic ejection 2/6, decrescendo at 2nd left intercostal space GI: soft, liver down 5 cm Extremities: extremities normal, atraumatic, no cyanosis or edema  Lab Results: Recent Labs    06/06/18 1749 06/07/18 0334  WBC 9.7 12.8*  HGB 7.1* 7.6*  HCT 22.0* 23.0*  PLT 253 289   BMET:  Recent Labs    06/06/18 0425 06/07/18 0334  NA 138 136  K 5.3* 5.8*  CL 98 99  CO2 24 20*  GLUCOSE 107* 119*  BUN 99* 156*  CREATININE 8.89* 11.04*  CALCIUM 8.0* 8.0*   No results for input(s): PTH in the last 72 hours. Iron Studies:  Recent Labs    06/05/18 1855  IRON 17*  TIBC 321    Studies/Results: Ct Abdomen Pelvis Wo Contrast  Result Date: 06/06/2018 CLINICAL DATA:  Anemia. EXAM: CT ABDOMEN AND PELVIS WITHOUT CONTRAST TECHNIQUE: Multidetector CT imaging of the abdomen and pelvis was performed following the standard protocol without IV contrast. COMPARISON:  None. FINDINGS: Lower chest: Bilateral posterior lower lobe consolidation/atelectasis. Hepatobiliary: Unenhanced appearance of the liver is unremarkable. The gallbladder appears to be contracted. Pancreas: Unremarkable. No pancreatic ductal dilatation  or surrounding inflammatory changes. Spleen: Normal in size without focal abnormality. Adrenals/Urinary Tract: Unenhanced appearance of the adrenal glands and kidneys are unremarkable. No hydronephrosis. The bladder appears unremarkable. Stomach/Bowel: Feeding tube extends into the duodenum with the tip at the juncture of the second and third portions. Visualized bowel shows no evidence of obstruction or ileus. No inflammation identified. Rectal tube present. Vascular/Lymphatic: Calcified abdominal aorta without evidence of aneurysmal disease. 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 Reproductive: Prostate is unremarkable. Other: No evidence of retroperitoneal or intraperitoneal hemorrhage. No abnormal fluid collections or ascites. No hernias identified. Musculoskeletal: No acute or significant osseous findings. IMPRESSION: 1. No evidence of intraperitoneal or retroperitoneal hemorrhage. 2. No acute findings in the abdomen or pelvis by unenhanced CT. 3. Dilatation of the left common femoral artery up to 1.9 cm in maximal diameter. Electronically Signed   By: Aletta Edouard M.D.   On: 06/06/2018 13:42    I have reviewed the patient's current medications.  Assessment/Plan: 1 AKI hypotension, arrest, contrast.   Oliguric. Very catabolic,  Raliegh Ip with transfusion, and catabolic state. .For HD., 2 Anemia will give Fe, Kirby Funk. 3 CAD per cards 4 Aggitation on Precedex 5 Substance abuse 6 HTN lower  Vol P HD, esa, Fe, Precedex, wean off,      LOS: 16 days   Colum Colt 06/07/2018,7:10 AM

## 2018-06-07 NOTE — Progress Notes (Signed)
Inpatient Rehabilitation  Continuing to follow along for therapy progress to help determine post acute rehab needs with no significant changes at this time.  Note MRI is pending.  Call if questions.   Carmelia Roller., CCC/SLP Admission Coordinator  [facility]  Cell [phone]

## 2018-06-08 DIAGNOSIS — E785 Hyperlipidemia, unspecified: Secondary | ICD-10-CM

## 2018-06-08 DIAGNOSIS — G7000 Myasthenia gravis without (acute) exacerbation: Secondary | ICD-10-CM

## 2018-06-08 LAB — CBC WITH DIFFERENTIAL/PLATELET
Abs Immature Granulocytes: 0.1 10*3/uL (ref 0.0–0.1)
BASOS ABS: 0 10*3/uL (ref 0.0–0.1)
Basophils Relative: 0 %
Eosinophils Absolute: 0.1 10*3/uL (ref 0.0–0.7)
Eosinophils Relative: 1 %
HCT: 23.9 % — ABNORMAL LOW (ref 39.0–52.0)
Hemoglobin: 7.7 g/dL — ABNORMAL LOW (ref 13.0–17.0)
IMMATURE GRANULOCYTES: 1 %
LYMPHS ABS: 0.7 10*3/uL (ref 0.7–4.0)
LYMPHS PCT: 9 %
MCH: 28.1 pg (ref 26.0–34.0)
MCHC: 32.2 g/dL (ref 30.0–36.0)
MCV: 87.2 fL (ref 78.0–100.0)
Monocytes Absolute: 0.8 10*3/uL (ref 0.1–1.0)
Monocytes Relative: 11 %
Neutro Abs: 5.9 10*3/uL (ref 1.7–7.7)
Neutrophils Relative %: 78 %
PLATELETS: 207 10*3/uL (ref 150–400)
RBC: 2.74 MIL/uL — AB (ref 4.22–5.81)
RDW: 19.9 % — ABNORMAL HIGH (ref 11.5–15.5)
WBC: 7.5 10*3/uL (ref 4.0–10.5)

## 2018-06-08 LAB — RENAL FUNCTION PANEL
ALBUMIN: 1.9 g/dL — AB (ref 3.5–5.0)
Anion gap: 12 (ref 5–15)
BUN: 77 mg/dL — ABNORMAL HIGH (ref 6–20)
CHLORIDE: 97 mmol/L — AB (ref 98–111)
CO2: 27 mmol/L (ref 22–32)
Calcium: 7.9 mg/dL — ABNORMAL LOW (ref 8.9–10.3)
Creatinine, Ser: 6.39 mg/dL — ABNORMAL HIGH (ref 0.61–1.24)
GFR, EST AFRICAN AMERICAN: 11 mL/min — AB (ref >60)
GFR, EST NON AFRICAN AMERICAN: 9 mL/min — AB (ref >60)
Glucose, Bld: 103 mg/dL — ABNORMAL HIGH (ref 70–99)
PHOSPHORUS: 6 mg/dL — AB (ref 2.5–4.6)
POTASSIUM: 4.8 mmol/L (ref 3.5–5.1)
Sodium: 136 mmol/L (ref 135–145)

## 2018-06-08 LAB — GLUCOSE, CAPILLARY
GLUCOSE-CAPILLARY: 109 mg/dL — AB (ref 70–99)
GLUCOSE-CAPILLARY: 103 mg/dL — AB (ref 70–99)
GLUCOSE-CAPILLARY: 99 mg/dL (ref 70–99)
GLUCOSE-CAPILLARY: 106 mg/dL — AB (ref 70–99)
Glucose-Capillary: 94 mg/dL (ref 70–99)

## 2018-06-08 LAB — HEPATIC FUNCTION PANEL
ALT: 12 U/L (ref 0–44)
AST: 35 U/L (ref 15–41)
Albumin: 1.9 g/dL — ABNORMAL LOW (ref 3.5–5.0)
Alkaline Phosphatase: 121 U/L (ref 38–126)
Bilirubin, Direct: 0.2 mg/dL (ref 0.0–0.2)
Indirect Bilirubin: 0.7 mg/dL (ref 0.3–0.9)
TOTAL PROTEIN: 5.1 g/dL — AB (ref 6.5–8.1)
Total Bilirubin: 0.9 mg/dL (ref 0.3–1.2)

## 2018-06-08 LAB — MAGNESIUM: MAGNESIUM: 2.5 mg/dL — AB (ref 1.7–2.4)

## 2018-06-08 MED ORDER — PYRIDOSTIGMINE BROMIDE 60 MG PO TABS
30.0000 mg | ORAL_TABLET | Freq: Three times a day (TID) | ORAL | Status: DC
  Administered 2018-06-08 – 2018-06-09 (×3): 30 mg via ORAL
  Filled 2018-06-08 (×3): qty 0.5

## 2018-06-08 MED ORDER — CHLORHEXIDINE GLUCONATE CLOTH 2 % EX PADS
6.0000 | MEDICATED_PAD | Freq: Every day | CUTANEOUS | Status: DC
  Administered 2018-06-08 – 2018-06-10 (×2): 6 via TOPICAL

## 2018-06-08 MED ORDER — NEPRO/CARBSTEADY PO LIQD
1000.0000 mL | ORAL | Status: DC
  Administered 2018-06-09 – 2018-06-12 (×3): 1000 mL
  Filled 2018-06-08 (×7): qty 1000

## 2018-06-08 MED ORDER — PYRIDOSTIGMINE BROMIDE 60 MG PO TABS: 30.0000 mg | ORAL_TABLET | Freq: Three times a day (TID) | ORAL | Status: DC

## 2018-06-08 MED ORDER — RENA-VITE PO TABS
1.0000 | ORAL_TABLET | Freq: Every day | ORAL | Status: DC
  Administered 2018-06-08 – 2018-06-20 (×13): 1 via ORAL
  Filled 2018-06-08 (×15): qty 1

## 2018-06-08 MED ORDER — ATORVASTATIN CALCIUM 20 MG PO TABS
20.0000 mg | ORAL_TABLET | Freq: Every day | ORAL | Status: DC
  Administered 2018-06-08 – 2018-06-10 (×3): 20 mg via ORAL
  Filled 2018-06-08 (×3): qty 1

## 2018-06-08 NOTE — Progress Notes (Addendum)
Progress Note  Patient Name: Howard Thompson Date of Encounter: 06/08/2018  Primary Cardiologist: No primary care provider on file.  Subjective   More alert today, knows he's at a hospital.   Inpatient Medications    Scheduled Meds: . sodium chloride   Intravenous Once  . aspirin  81 mg Per Tube Daily  . carvedilol  6.25 mg Per NG tube BID WC  . chlorhexidine  15 mL Mouth Rinse BID  . Chlorhexidine Gluconate Cloth  6 each Topical Q0600  . Chlorhexidine Gluconate Cloth  6 each Topical Q0600  . [START ON 06/10/2018] darbepoetin (ARANESP) injection - DIALYSIS  200 mcg Intravenous Q Thu-HD  . famotidine  20 mg Per Tube Daily  . feeding supplement (NEPRO CARB STEADY)  1,000 mL Per Tube Q24H  . feeding supplement (PRO-STAT SUGAR FREE 64)  30 mL Per Tube BID  . insulin aspart  0-9 Units Subcutaneous Q4H  . LORazepam  1 mg Oral TID   Followed by  . [START ON 06/10/2018] LORazepam  0.5 mg Oral TID  . mouth rinse  15 mL Mouth Rinse q12n4p  . multivitamin  1 tablet Oral QHS  . pyridostigmine  30 mg Oral Q8H  . QUEtiapine  50 mg Oral BID  . rosuvastatin  10 mg Oral q1800  . sodium chloride flush  3 mL Intravenous Q12H  . ticagrelor  90 mg Per Tube BID   Continuous Infusions: . sodium chloride 250 mL (06/05/18 2138)  . dexmedetomidine (PRECEDEX) IV infusion 0.9 mcg/kg/hr (06/08/18 0720)  . ferumoxytol 510 mg (06/07/18 0926)   PRN Meds: sodium chloride, albuterol, docusate, Gerhardt's butt cream, hydrALAZINE, labetalol, LORazepam, nitroGLYCERIN, sodium chloride flush   Vital Signs    Vitals:   06/08/18 0600 06/08/18 0700 06/08/18 0744 06/08/18 0800  BP: (!) 147/87 (!) 144/84  (!) 146/72  Pulse: 77 73  72  Resp:  18  17  Temp:   98.6 F (37 C)   TempSrc:   Oral   SpO2:  97%  98%  Weight:      Height:        Intake/Output Summary (Last 24 hours) at 06/08/2018 1116 Last data filed at 06/08/2018 0500 Gross per 24 hour  Intake 1796.46 ml  Output 1000 ml  Net 796.46 ml    Filed Weights   06/07/18 0654 06/07/18 1215 06/08/18 0500  Weight: 58.3 kg 57.3 kg 61.8 kg    Telemetry    SR - Personally Reviewed  Physical Exam   General: Thin W male appearing in no acute distress. Head: Normocephalic, atraumatic.  Neck: Supple, JVD. Lungs:  Resp regular and unlabored, CTA. Heart: RRR, S1, S2, systolic murmur; no rub. Abdomen: Soft, non-tender, non-distended with normoactive bowel sounds.  Extremities: No clubbing, cyanosis, edema. Distal pedal pulses are 2+ bilaterally. Neuro: Alert to self and surroundings, Moves all extremities spontaneously.  Labs    Chemistry Recent Labs  Lab 06/06/18 0425 06/07/18 0334 06/07/18 0652 06/08/18 0416  NA 138 136 137 136  K 5.3* 5.8* 6.2* 4.8  CL 98 99 97* 97*  CO2 24 20* 19* 27  GLUCOSE 107* 119* 121* 103*  BUN 99* 156* 160* 77*  CREATININE 8.89* 11.04* 11.30* 6.39*  CALCIUM 8.0* 8.0* 8.1* 7.9*  PROT 5.3* 5.7*  --  5.1*  ALBUMIN 2.1*  2.1* 2.3* 2.3* 1.9*  1.9*  AST 27 39  --  35  ALT 8 12  --  12  ALKPHOS 112 144*  --  121  BILITOT 0.8 1.0  --  0.9  GFRNONAA 6* 5* 5* 9*  GFRAA 7* 5* 5* 11*  ANIONGAP 16* 17* 21* 12     Hematology Recent Labs  Lab 06/07/18 0334 06/07/18 0652 06/07/18 1411 06/07/18 2031 06/08/18 0416  WBC 12.8* 13.6*  --   --  7.5  RBC 2.67* 2.67*  --   --  2.74*  HGB 7.6* 7.5* 7.5* 8.0* 7.7*  HCT 23.0* 23.1* 22.7* 24.1* 23.9*  MCV 86.1 86.5  --   --  87.2  MCH 28.5 28.1  --   --  28.1  MCHC 33.0 32.5  --   --  32.2  RDW 20.8* 21.0*  --   --  19.9*  PLT 289 300  --   --  207    Cardiac EnzymesNo results for input(s): TROPONINI in the last 168 hours. No results for input(s): TROPIPOC in the last 168 hours.   BNPNo results for input(s): BNP, PROBNP in the last 168 hours.   DDimer No results for input(s): DDIMER in the last 168 hours.    Radiology    Ct Abdomen Pelvis Wo Contrast  Result Date: 06/06/2018 CLINICAL DATA:  Anemia. EXAM: CT ABDOMEN AND PELVIS WITHOUT  CONTRAST TECHNIQUE: Multidetector CT imaging of the abdomen and pelvis was performed following the standard protocol without IV contrast. COMPARISON:  None. FINDINGS: Lower chest: Bilateral posterior lower lobe consolidation/atelectasis. Hepatobiliary: Unenhanced appearance of the liver is unremarkable. The gallbladder appears to be contracted. Pancreas: Unremarkable. No pancreatic ductal dilatation or surrounding inflammatory changes. Spleen: Normal in size without focal abnormality. Adrenals/Urinary Tract: Unenhanced appearance of the adrenal glands and kidneys are unremarkable. No hydronephrosis. The bladder appears unremarkable. Stomach/Bowel: Feeding tube extends into the duodenum with the tip at the juncture of the second and third portions. Visualized bowel shows no evidence of obstruction or ileus. No inflammation identified. Rectal tube present. Vascular/Lymphatic: Calcified abdominal aorta without evidence of aneurysmal disease. 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 Reproductive: Prostate is unremarkable. Other: No evidence of retroperitoneal or intraperitoneal hemorrhage. No abnormal fluid collections or ascites. No hernias identified. Musculoskeletal: No acute or significant osseous findings. IMPRESSION: 1. No evidence of intraperitoneal or retroperitoneal hemorrhage. 2. No acute findings in the abdomen or pelvis by unenhanced CT. 3. Dilatation of the left common femoral artery up to 1.9 cm in maximal diameter. Electronically Signed   By: Aletta Edouard M.D.   On: 06/06/2018 13:42   Mr Brain Wo Contrast  Result Date: 06/07/2018 CLINICAL DATA:  52 y/o M; found down, unwitnessed arrest with CPR, status post hypothermia protocol. Patient with metabolic disarray, acid base imbalance, hypovolemia, and new onset delirium with incomprehensible speech 3 days ago. EXAM: MRI HEAD WITHOUT CONTRAST TECHNIQUE: Axial and coronal diffusion weighted  sequences were acquired. The patient was unable to remain still and additional sequences were not acquired. COMPARISON:  05/22/2018 CT head FINDINGS: Motion degraded axial and coronal diffusion weighted sequences. No gross diffusion abnormality to suggest acute or early subacute ischemia. On the low B value sequence there are no findings of mass effect, hydrocephalus, or herniation. IMPRESSION: Motion degraded diffusion-weighted sequences. No gross diffusion abnormality to suggest acute or early subacute ischemia. No mass effect, hydrocephalus, or herniation. Repeat MRI of the brain is recommended when patient is able to remain still. Electronically Signed   By: Kristine Garbe M.D.   On: 06/07/2018 22:45   Dg Chest [facility] 1 View  Result  Date: 06/07/2018 CLINICAL DATA:  Respiratory failure and shortness of breath. EXAM: PORTABLE CHEST 1 VIEW COMPARISON:  06/04/2018 and prior radiographs FINDINGS: Cardiomegaly, small bore feeding tube entering the stomach with tip off the field of view and RIGHT central venous catheter with tip overlying the SUPERIOR cavoatrial junction noted. Bilateral LOWER lung opacities/airspace disease again noted, slightly improved on the LEFT. No pneumothorax or large pleural effusion identified. IMPRESSION: Bilateral LOWER lung opacities/airspace disease, slightly improved on the LEFT. Feeding tube placement. No other significant change. Electronically Signed   By: Margarette Canada M.D.   On: 06/07/2018 07:14    Cardiac Studies   Cath: 05/22/18    Ost LAD to Prox LAD lesion is 100% stenosed.  A drug-eluting stent was successfully placed using a STENT SYNERGY DES 3X20.  Post intervention, there is a 0% residual stenosis.  There is severe left ventricular systolic dysfunction.  LV end diastolic pressure is mildly elevated.  The left ventricular ejection fraction is less than 25% by visual estimate.   TTE: 05/23/18  Study Conclusions  - Left ventricle: The  cavity size was normal. Systolic function was severely reduced. The estimated ejection fraction was in the range of 25% to 30%. Severe diffuse hypokinesis with regional variations. There is akinesis of the apicalanterior and apical myocardium. There is akinesis of the midanteroseptal myocardium. There is severe hypokinesis of the inferoseptal myocardium. Features are consistent with a pseudonormal left ventricular filling pattern, with concomitant abnormal relaxation and increased filling pressure (grade 2 diastolic dysfunction). Doppler parameters are consistent with high ventricular filling pressure. - Aortic valve: A bicuspid morphology cannot be excluded; mildly thickened, mildly calcified leaflets. There was mild to moderate regurgitation. Regurgitation pressure half-time: 718 ms. - Mitral valve: There was mild to moderate regurgitation. Valve area by pressure half-time: 2.39 cm^2. - Right ventricle: Systolic function was moderately reduced. - Atrial septum: There was increased thickness of the septum, consistent with lipomatous hypertrophy. - Tricuspid valve: There was mild regurgitation. - Pulmonary arteries: PA peak pressure: 33 mm Hg (S). - Inferior vena cava: The vessel was dilated. The respirophasic diameter changes were blunted (<50%), consistent with elevated central venous pressure. - Pericardium, extracardiac: A small, free-flowing pericardial effusion was identified circumferential to the heart. The fluid had no internal echoes.There was no evidence of hemodynamic compromise.  Impressions:  - severe LV dysfunction with diffuse HK but apica/anteroseptal and apical anterior AK. Recommend definity contrast study to rule out apical thrombus.  Patient Profile     52 y.o. male with anout of hospital cardiac arrest, prior history of MI several years ago, myasthenia gravis post thymus resection, anterior ST elevation post LAD  stent. Extubated on 8/2.  Assessment & Plan    1. Cardiac arrest: Underwent cath with PCI/DES x1 to the pLADon 05/22/18. Planned for DAPT with ASA/Brilinta for 12 months.  -Continue Crestor 10 mg daily, ASA 81 mg and Brilinta 90 mg twice daily per NG tube as he is not taking p.o. due to delirium  -in light of h/o cocaine use, lopressor changedto carvedilol 6.25mg  BID per NGT  2. ICM -EF noted at 25-30%.  -No room for ARB/ACEi given AKF -continue carvedilol  3. Anoxic encephalopathy - EEG was abnormal but repeat now improving. -Now with acute delirium and agitation -Neurofollowing and on Seroquel/Ativan, and precedex gtt. More alert today. Plan to restart Mestinon.   4. Acute renal failure: -Nephrology following.  -S/p placement of new dialysis catheter right subclavian.  -Creatinine was6.39and potassium 4.8and receiving dialysis.  -  per nephrology  5. Polysubstance abuse:+ for cocaine on admission.   6. Acute blood loss anemia felt to be 2/2 to bleeding HD cath: - CT abd negative for acute bleed - Hgb maintaining around 7.7. Receive IV iron.   - Ideally would like Hgb above 8 given coronary issues but need to balance with volume issues. Continue to monitor Hgb with dialysis.   7. Systolic heart murmur at the apex -2D echo 05/23/2018 showed mild to moderate mitral regurgitation and mild to moderate aortic insufficiency   Signed, Reino Bellis, NP  06/08/2018, 11:16 AM  Pager # [phone]   For questions or updates, please contact [place] Please consult [email or web address] for contact info under Cardiology/STEMI.   Patient seen and examined. Agree with assessment and plan. On presedex but alert and talking with me today. Continues to have stable hemodynamics. Mestinon to be restarted with h/o myathenia.  LFTS normal can probably resume statin; will initiate at reduced dose of atorvastatin 20 mg and increase if LFTs remain stable.  Discussed with family members  who inquired if there is any improvement in heart function. Will re-assess with f/u echo. If EF  remains severely depressed may need life-vest and future ICD.   Troy Sine, MD, [facility] 06/08/2018 3:56 PM

## 2018-06-08 NOTE — Progress Notes (Signed)
PULMONARY / CRITICAL CARE MEDICINE   Name: Howard Thompson MRN: [id number] DOB: 01-10-1966    ADMISSION DATE:  05/22/2018 CONSULTATION DATE: 06/05/2018 LOS 17 days  REFERRING MD: [facility]  CHIEF COMPLAINT: Post cardiac arrest.  Acute renal failure.  Delirium.  Polysubstance abuse. BRIEF  52 year old with history of tobacco abuse substance abuse alcohol abuse cocaine abuse who had out of hospital arrest 05/22/2018.  He has undergone cardiac cath with LAD stent.  He had blood loss anemia secondary to HD catheter tunnel placed his require a transfusion.  His main problem is agitation and delirium and is transferred to the intensive care unit on 06/05/2018 for use of Precedex.  There is some concern of questionable retroperitoneal bleed although his abdomen is soft and nontender.  Transfused 06/06/2018 and consideration for CT of the abdomen is being entertained.    STUDIES:    CULTURES: 06/04/2018 blood cultures obtained  ANTIBIOTICS:   SIGNIFICANT EVENTS: 7/27 cardiac cath  LINES/TUBES: Right tunneled HD catheter>>    EVENTS 06/06/18 - 35 year old who continues to have anemia with no apparent blood loss.  Delirium and agitation and combative behavior continue p.o. Issue   8/12 - on precedex. Still needing it due to agitated delirium. Not on vent on Room air. On precedex 1.2. Neuro wanting MRI - but agiation interfering. Anuria continues - on iHD - MWF. Currently on HD. S/p PRBC yesterday. No active bleeding   SUBJECTIVE/OVERNIGHT/INTERVAL HX  8/13 - per neuro -> myasthenic weakness by end of day cotnributing to fatigue and dysarthria and secondary agitation. Mestinon. Now sitting in chair. But remains on precedex and oriented.  No HD today   VITAL SIGNS: BP (!) 146/72   Pulse 72   Temp 98.6 F (37 C) (Oral)   Resp 17   Ht 5\' 10"  (1.778 m)   Wt 61.8 kg   SpO2 98%   BMI 19.55 kg/m   HEMODYNAMICS:    VENTILATOR SETTINGS:    INTAKE / OUTPUT: I/O last 3 completed  shifts: In: 3069 [I.V.:490; Blood:612; Other:120; NG/GT:1730; IV Piggyback:117] Out: 1000 [Other:1000]  PHYSICAL EXAMINATION:  General Appearance:    Looks chronic critically ill and fatigued and deconditioned  Head:    Normocephalic, without obvious abnormality, atraumatic  Eyes:    PERRL - yes, conjunctiva/corneas - yes      Ears:    Normal external ear canals, both ears  Nose:   NG tube - yes . On room air  Throat:  ETT TUBE - no , OG tube - no  Neck:   Supple,  No enlargement/tenderness/nodules     Lungs:     Clear to auscultation bilaterally,   Chest wall:    No deformity  Heart:    S1 and S2 normal, no murmur, CVP - no.  Pressors - no  Abdomen:     Soft, no masses, no organomegaly  Genitalia:    Not done  Rectal:   not done  Extremities:   Extremities- emaciated     Skin:   Intact in exposed areas .     Neurologic:   Sedation - precedex gtt -> RASS - +1 . Moves all 4s - yes. CAM-ICU - neg . Orientation - x3+. Deconditioned         PULMONARY Recent Labs  Lab 06/04/18 0924  PHART 7.515*  PCO2ART 33.7  PO2ART 71.6*  HCO3 27.0  O2SAT 95.1    CBC Recent Labs  Lab 06/07/18 0334 06/07/18 7494 06/07/18 1411 06/07/18 2031 06/08/18 0416  HGB 7.6* 7.5* 7.5* 8.0* 7.7*  HCT 23.0* 23.1* 22.7* 24.1* 23.9*  WBC 12.8* 13.6*  --   --  7.5  PLT 289 300  --   --  207    COAGULATION Recent Labs  Lab 06/01/18 2231 06/04/18 1044  INR 1.18 1.23    CARDIAC  No results for input(s): TROPONINI in the last 168 hours. No results for input(s): PROBNP in the last 168 hours.   CHEMISTRY Recent Labs  Lab 06/04/18 9030 06/05/18 0238 06/05/18 1226 06/06/18 0425 06/07/18 0334 06/07/18 0652 06/08/18 0416  NA 138 136 139 138 136 137 136  K 6.2* 4.9 5.1 5.3* 5.8* 6.2* 4.8  CL 95* 96* 99 98 99 97* 97*  CO2 25 27 25 24  20* 19* 27  GLUCOSE 88 83 92 107* 119* 121* 103*  BUN 71* 41* 59* 99* 156* 160* 77*  CREATININE 8.11* 5.45* 6.91* 8.89* 11.04* 11.30* 6.39*  CALCIUM  8.5* 8.2* 8.2* 8.0* 8.0* 8.1* 7.9*  MG 2.6* 2.3  --  2.9* 3.3*  --  2.5*  PHOS  --  6.8*  6.8*  --  9.1* 9.6* 10.3* 6.0*   Estimated Creatinine Clearance: 12 mL/min (A) (by C-G formula based on SCr of 6.39 mg/dL (H)).   LIVER Recent Labs  Lab 06/01/18 2231  06/04/18 0943 06/04/18 1044 06/05/18 0238 06/06/18 0425 06/07/18 0334 06/07/18 0652 06/08/18 0416  AST  --    < > 36  --  34 27 39  --  35  ALT  --    < > 7  --  8 8 12   --  12  ALKPHOS  --    < > 137*  --  127* 112 144*  --  121  BILITOT  --    < > 1.4*  --  1.4* 0.8 1.0  --  0.9  PROT  --    < > 6.1*  --  5.6* 5.3* 5.7*  --  5.1*  ALBUMIN  --    < > 2.6*  --  2.3*  2.3* 2.1*  2.1* 2.3* 2.3* 1.9*  1.9*  INR 1.18  --   --  1.23  --   --   --   --   --    < > = values in this interval not displayed.     INFECTIOUS Recent Labs  Lab 06/04/18 1047 06/04/18 1228 06/05/18 0238 06/06/18 0425  LATICACIDVEN 1.0  --   --   --   PROCALCITON  --  3.00 4.85 5.34     ENDOCRINE CBG (last 3)  Recent Labs    06/07/18 2320 06/08/18 0345 06/08/18 0741  GLUCAP 100* 109* 94         IMAGING x48h  - image(s) personally visualized  -   highlighted in bold Ct Abdomen Pelvis Wo Contrast  Result Date: 06/06/2018 CLINICAL DATA:  Anemia. EXAM: CT ABDOMEN AND PELVIS WITHOUT CONTRAST TECHNIQUE: Multidetector CT imaging of the abdomen and pelvis was performed following the standard protocol without IV contrast. COMPARISON:  None. FINDINGS: Lower chest: Bilateral posterior lower lobe consolidation/atelectasis. Hepatobiliary: Unenhanced appearance of the liver is unremarkable. The gallbladder appears to be contracted. Pancreas: Unremarkable. No pancreatic ductal dilatation or surrounding inflammatory changes. Spleen: Normal in size without focal abnormality. Adrenals/Urinary Tract: Unenhanced appearance of the adrenal glands and kidneys are unremarkable. No hydronephrosis. The bladder appears unremarkable. Stomach/Bowel: Feeding tube  extends into the duodenum with the tip at the juncture of the second and  third portions. Visualized bowel shows no evidence of obstruction or ileus. No inflammation identified. Rectal tube present. Vascular/Lymphatic: Calcified abdominal aorta without evidence of aneurysmal disease. 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 Reproductive: Prostate is unremarkable. Other: No evidence of retroperitoneal or intraperitoneal hemorrhage. No abnormal fluid collections or ascites. No hernias identified. Musculoskeletal: No acute or significant osseous findings. IMPRESSION: 1. No evidence of intraperitoneal or retroperitoneal hemorrhage. 2. No acute findings in the abdomen or pelvis by unenhanced CT. 3. Dilatation of the left common femoral artery up to 1.9 cm in maximal diameter. Electronically Signed   By: Aletta Edouard M.D.   On: 06/06/2018 13:42   Mr Brain Wo Contrast  Result Date: 06/07/2018 CLINICAL DATA:  52 y/o M; found down, unwitnessed arrest with CPR, status post hypothermia protocol. Patient with metabolic disarray, acid base imbalance, hypovolemia, and new onset delirium with incomprehensible speech 3 days ago. EXAM: MRI HEAD WITHOUT CONTRAST TECHNIQUE: Axial and coronal diffusion weighted sequences were acquired. The patient was unable to remain still and additional sequences were not acquired. COMPARISON:  05/22/2018 CT head FINDINGS: Motion degraded axial and coronal diffusion weighted sequences. No gross diffusion abnormality to suggest acute or early subacute ischemia. On the low B value sequence there are no findings of mass effect, hydrocephalus, or herniation. IMPRESSION: Motion degraded diffusion-weighted sequences. No gross diffusion abnormality to suggest acute or early subacute ischemia. No mass effect, hydrocephalus, or herniation. Repeat MRI of the brain is recommended when patient is able to remain still. Electronically Signed   By:  Kristine Garbe M.D.   On: 06/07/2018 22:45   Dg Chest Port 1 View  Result Date: 06/07/2018 CLINICAL DATA:  Respiratory failure and shortness of breath. EXAM: PORTABLE CHEST 1 VIEW COMPARISON:  06/04/2018 and prior radiographs FINDINGS: Cardiomegaly, small bore feeding tube entering the stomach with tip off the field of view and RIGHT central venous catheter with tip overlying the SUPERIOR cavoatrial junction noted. Bilateral LOWER lung opacities/airspace disease again noted, slightly improved on the LEFT. No pneumothorax or large pleural effusion identified. IMPRESSION: Bilateral LOWER lung opacities/airspace disease, slightly improved on the LEFT. Feeding tube placement. No other significant change. Electronically Signed   By: Margarette Canada M.D.   On: 06/07/2018 07:14   DISCUSSION: 52 year old with history of tobacco abuse substance abuse alcohol abuse cocaine abuse who had out of hospital arrest 05/22/2018.  He has undergone cardiac cath with LAD stent.  He had blood loss anemia secondary to HD catheter tunnel placed his require a transfusion.  His main problem is agitation and delirium and is transferred to the intensive care unit on 06/05/2018 for use of Precedex.  There is some concern of questionable retroperitoneal bleed although his abdomen is soft and nontender.  Transfused 06/06/2018 and consideration for CT of the abdomen is being entertained.  ASSESSMENT / PLAN:  PULMONARY A: History of tobacco abuse Most likely underlying COPD   - maintains resp status 06/08/2018 . More stable   P:   Monitor  CARDIOVASCULAR A:  Out-of-hospital cardiac arrest Ventricular fibrillation Acute coronary syndrome Status post cardiac catheterization 05/22/2018 with stent LAD On ASA and Brilinta for 12 months   maintaints bp/hr  P:  Per cards   RENAL Lab Results  Component Value Date   CREATININE 6.39 (H) 06/08/2018   CREATININE 11.30 (H) 06/07/2018   CREATININE 11.04 (H) 06/07/2018    Recent Labs  Lab 06/07/18 0334 06/07/18 1761 06/08/18 0416  K 5.8* 6.2* 4.8    A:   Acute renal failure with anuria. On 1HD MWF. Last HD 8/132/19  P:   Per RENAL  GASTROINTESTINAL A:   Malnourished  P:   Tube feeds   HEMATOLOGIC Recent Labs    06/07/18 2031 06/08/18 0416  HGB 8.0* 7.7*    A:   Anemia most likely multifactorial with recent bleeding episode from placement of tunneled HD catheter right IJ and DAPT. No RP bleed on CT 06/06/18   - s/p 1 unit PRBC 05/25/18, 06/02/18, 06/03/18 , 06/06/18, 06/07/18 x2 units -  - goal Hgb > 8gm% on 06/07/18   p:  -change goal hgb > 7gm% because LOS is > 16 days   INFECTIOUS A:   Blood cultures x2 ordered on 06/05/2018  06/08/2018 - not on abx  P:   monitor   ENDOCRINE CBG (last 3)  Recent Labs    06/07/18 2320 06/08/18 0345 06/08/18 0741  GLUCAP 100* 109* 94    A:   No acute issues P:   Trend CBGs  NEUROLOGIC A: History of polysubstance abuse   History of myasthenia gravis   Agitation post cardiac arrest. Transfer to intensive care unit for Precedex on 06/05/2018   06/08/2018 -  Improved agittaed delirium. Fatigue + - multofactorial. Neuro thinks Myasthenia playing a role - patient takes it prn at home  P:   Precedex gtt - wean Start mestinon 06/08/18 Can dol haldol prn if QTc allows RASS goal: 1 Neurology is following   MSK  A: deconditioned  P PT/OT consult   CODE     Code Status Orders  (From admission, onward)         Start     Ordered   05/23/18 2136  Limited resuscitation (code)  Continuous    Question Answer Comment  In the event of cardiac or respiratory ARREST: Initiate Code Blue, Call Rapid Response Yes   In the event of cardiac or respiratory ARREST: Perform CPR No   In the event of cardiac or respiratory ARREST: Perform Intubation/Mechanical Ventilation Yes   In the event of cardiac or respiratory ARREST: Use NIPPV/BiPAp only if indicated Yes   In the event of  cardiac or respiratory ARREST: Administer ACLS medications if indicated Yes   In the event of cardiac or respiratory ARREST: Perform Defibrillation or Cardioversion if indicated Yes      05/23/18 2135        Code Status History    Date Active Date Inactive Code Status Order ID Comments User Context   05/22/2018 1957 05/23/2018 2135 Full Code [id number]  Omar Person, NP Inpatient   05/22/2018 1956 05/22/2018 1957 Full Code [id number]  Reyne Dumas, MD Inpatient   05/22/2018 1940 05/22/2018 1956 Full Code [id number]  Lorretta Harp, MD Inpatient   05/22/2018 1940 05/22/2018 1940 Full Code [id number]  Cristina Gong, MD Inpatient        FAMILY  - Updates: 06/06/2018 Sister updated at bedside.  None at bedside 06/08/2018       The patient is critically ill with multiple organ systems failure and requires high complexity decision making for assessment and support, frequent evaluation and titration of therapies, application of advanced monitoring technologies and extensive interpretation of multiple databases.   Critical Care Time devoted to patient care services described in this note is  30  Minutes. This time reflects time of care of this signee Dr Brand Males. This critical care time does not reflect  procedure time, or teaching time or supervisory time of PA/NP/Med student/Med Resident etc but could involve care discussion time    Dr. Brand Males, M.D., [facility].C.P Pulmonary and Critical Care Medicine Staff Physician [facility] Pager: [phone], If no answer or between  15:00h - 7:00h: call [phone]  06/08/2018 9:39 AM

## 2018-06-08 NOTE — Progress Notes (Addendum)
NEUROLOGY PROGRESS NOTE  Subjective: More alert today, oriented to hospital but not month or year.  He is able to follow simple commands.  He did admit to drinking one half 1/2 of EtOH a day.  Patient asked to have [place] look further into his chart.  At that time it was noted that patient actually has myasthenia.  However he is not on his Mestinon that he usually takes.  Patient sees Dr. Berdine Addison (Neurologist) [facility] for a neurologist.  This could very well be the reason that he is very dysarthric and weak.  Exam: Vitals:   06/08/18 0744 06/08/18 0800  BP:  (!) 146/72  Pulse:  72  Resp:  17  Temp: 98.6 F (37 C)   SpO2:  98%  Physical Exam  HEENT-  Normocephalic, no lesions, without obvious abnormality.  Normal external eye and conjunctiva.   Extremities- Warm, dry and intact Musculoskeletal-week Skin-warm and dry, no hyperpigmentation, vitiligo, or suspicious lesions Neuro:  Mental Status: Alert, oriented, thought content appropriate.  Speech fluent without evidence of aphasia.  Able to follow 3 step commands without difficulty. Cranial Nerves: II:  Visual fields grossly normal,  III,IV, VI: mild ptosis present bilaterally, extra-ocular motions intact bilaterally pupils equal, round, reactive to light and accommodation but reports of diplopia with upward gaze and rightward gaze if sustained V,VII: smile symmetric, facial light touch sensation normal bilaterally VIII: hearing normal bilaterally IX,X: uvula rises midline XI: bilateral shoulder shrug XII: midline tongue extension Motor: Moves all extremities antigravity and exhibits fatiguability Sensory: Pinprick and light touch intact throughout, bilaterally Deep Tendon Reflexes: Lateral upper extremities 2+ with no deep tendon reflexes in the lower extremitiesd  Medications:  Scheduled: . sodium chloride   Intravenous Once  . aspirin  81 mg Per Tube Daily  . carvedilol  6.25 mg Per NG tube BID WC  . chlorhexidine  15 mL Mouth  Rinse BID  . Chlorhexidine Gluconate Cloth  6 each Topical Q0600  . Chlorhexidine Gluconate Cloth  6 each Topical Q0600  . [START ON 06/10/2018] darbepoetin (ARANESP) injection - DIALYSIS  200 mcg Intravenous Q Thu-HD  . famotidine  20 mg Per Tube Daily  . feeding supplement (NEPRO CARB STEADY)  1,000 mL Per Tube Q24H  . feeding supplement (PRO-STAT SUGAR FREE 64)  30 mL Per Tube BID  . insulin aspart  0-9 Units Subcutaneous Q4H  . LORazepam  1 mg Oral TID   Followed by  . [START ON 06/10/2018] LORazepam  0.5 mg Oral TID  . mouth rinse  15 mL Mouth Rinse q12n4p  . multivitamin  1 tablet Oral QHS  . pyridostigmine  30 mg Oral Q8H  . QUEtiapine  50 mg Oral BID  . rosuvastatin  10 mg Oral q1800  . sodium chloride flush  3 mL Intravenous Q12H  . ticagrelor  90 mg Per Tube BID   Continuous: . sodium chloride 250 mL (06/05/18 2138)  . dexmedetomidine (PRECEDEX) IV infusion 0.9 mcg/kg/hr (06/08/18 0720)  . ferumoxytol 510 mg (06/07/18 0926)    Pertinent Labs/Diagnostics: None  MRI: motion riddled, but no acute stroke.   Etta Quill PA-C [facility] [phone]  Assessment: 52 year old male with weakness and dysphasia with new information that he has myasthenia gravis which likely is causing his weakness and dysphasia. MRI negative for stroke. Likely worsening of MG in setting of toxic metabolic derangements. Don't suspect he is in MG crisis.  Recommendations: -Start patient on Mestinon 30 mg 3 times daily - Repeat speech  swallow and PT tomorrow -Correct toxic metabolic derangements per primary team as you are. -NIFs and FVCs q8h We will follow.  06/08/2018, 10:32 AM  Attending Neurohospitalist Addendum Patient seen and examined with APP/Resident. Agree with the history and physical as documented above. Agree with the plan as documented, which I helped formulate. I have independently reviewed the chart, obtained history, review of systems and examined the  patient.I have personally reviewed pertinent head/neck/spine imaging (CT/MRI). Please feel free to call with any questions. --- Amie Portland, MD [facility] Pager: [phone]  If 7pm to 7am, please call on call as listed on AMION.

## 2018-06-08 NOTE — Progress Notes (Signed)
Occupational Therapy Treatment Patient Details Name: Howard Thompson MRN: [id number] DOB: [birth year] Today's Date: 06/08/2018    History of present illness 52 y.o. male out of hospital cardiac arrest (05/22/18), Intubated 7/27- 8/2.  s/p IJ tunneled HD catheter placed 05/31/18 with bleeding from IJ site. Pt with anoxic encephalopathy.Marland Kitchen PMHx: myasthenia gravis post thymus resection, HTN, anterior STEMI post LAD stent.    OT comments  Pt making good progress toward goals. Pt able to stand and pivot to chair with max A and participate in simple ADL task. Complaining of rib pain. Pt with apparent cognitive deficits as listed below. VSS during session. Continue to recommend SNF for rehab. Will follow acutely.   Follow Up Recommendations  Supervision/Assistance - 24 hour;SNF    Equipment Recommendations  3 in 1 bedside commode    Recommendations for Other Services      Precautions / Restrictions Precautions Precautions: Fall Precaution Comments: suboccipital pressure wound, watch SpO2       Mobility Bed Mobility Overal bed mobility: Needs Assistance Bed Mobility: Supine to Sit     Supine to sit: Mod assist     General bed mobility comments: due to complaints of rib pain and attneitonal deficits  Transfers Overall transfer level: Needs assistance   Transfers: Sit to/from Stand;Stand Pivot Transfers Sit to Stand: Mod assist;Max assist              Balance Overall balance assessment: Needs assistance   Sitting balance-Leahy Scale: Fair       Standing balance-Leahy Scale: Poor                             ADL either performed or assessed with clinical judgement   ADL Overall ADL's : Needs assistance/impaired Eating/Feeding: Sitting;NPO;Moderate assistance(ice chips only at this time; mod A to manage cup)   Grooming: Moderate assistance;Sitting   Upper Body Bathing: Moderate assistance;Sitting   Lower Body Bathing: Sit to/from stand;Moderate assistance       Lower Body Dressing: Moderate assistance;Bed level Lower Body Dressing Details (indicate cue type and reason): Able to donn socks @ bed level             Functional mobility during ADLs: Cueing for safety;Cueing for sequencing;Maximal assistance       Vision   Additional Comments: wears glasses; not in room   Perception     Praxis      Cognition Arousal/Alertness: Lethargic;Awake/alert(lethargic initially ) Behavior During Therapy: Restless Overall Cognitive Status: Impaired/Different from baseline Area of Impairment: Orientation;Attention;Memory;Safety/judgement;Following commands;Awareness;Problem solving                 Orientation Level: Disoriented to;Time;Situation Current Attention Level: Sustained Memory: Decreased recall of precautions;Decreased short-term memory Following Commands: Follows one step commands consistently Safety/Judgement: Decreased awareness of safety;Decreased awareness of deficits Awareness: Intellectual Problem Solving: Slow processing;Difficulty sequencing;Requires verbal cues;Requires tactile cues General Comments: improved cognition from last session        Exercises     Shoulder Instructions       General Comments      Pertinent Vitals/ Pain       Pain Assessment: Faces Faces Pain Scale: No hurt Pain Location: ribs Pain Descriptors / Indicators: Grimacing;Guarding Pain Intervention(s): Limited activity within patient's tolerance  Home Living  Prior Functioning/Environment              Frequency  Min 2X/week        Progress Toward Goals  OT Goals(current goals can now be found in the care plan section)  Progress towards OT goals: Progressing toward goals  Acute Rehab OT Goals 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  OT Goal Formulation: Patient unable to participate in goal setting Time For Goal Achievement: 06/15/18 Potential to Achieve  Goals: Good ADL Goals Pt Will Perform Grooming: with min assist;sitting Pt Will Perform Upper Body Dressing: with min assist;sitting Pt Will Transfer to Toilet: with min assist;stand pivot transfer;bedside commode Additional ADL Goal #1: Pt will perform bed mobility with Min A in preparation for ADLs  Plan Discharge plan remains appropriate    Co-evaluation    PT/OT/SLP Co-Evaluation/Treatment: Yes Reason for Co-Treatment: Complexity of the patient's impairments (multi-system involvement);Necessary to address cognition/behavior during functional activity;For patient/therapist safety   OT goals addressed during session: ADL's and self-care      AM-PAC PT "6 Clicks" Daily Activity     Outcome Measure   Help from another person eating meals?: A Lot Help from another person taking care of personal grooming?: A Lot Help from another person toileting, which includes using toliet, bedpan, or urinal?: A Lot Help from another person bathing (including washing, rinsing, drying)?: A Lot Help from another person to put on and taking off regular upper body clothing?: A Lot Help from another person to put on and taking off regular lower body clothing?: A Lot 6 Click Score: 12    End of Session Equipment Utilized During Treatment: Gait belt  OT Visit Diagnosis: Other abnormalities of gait and mobility (R26.89);Muscle weakness (generalized) (M62.81);Other symptoms and signs involving cognitive function;Pain Pain - part of body: (ribs)   Activity Tolerance Patient tolerated treatment well   Patient Left in chair;in CPM;with chair alarm set   Nurse Communication Mobility status        Time: 6203-5597 OT Time Calculation (min): 29 min  Charges: OT General Charges $OT Visit: 1 Visit OT Treatments $Self Care/Home Management : 8-22 mins  Maurie Boettcher, OT/L  OT Clinical Specialist [phone]    [facility] 06/08/2018, 11:15 AM
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
## 2018-06-08 NOTE — Progress Notes (Addendum)
Nutrition Follow-up  DOCUMENTATION CODES:   Not applicable  INTERVENTION:   Continue TF via Cortrak tube:  Increase Nepro to 55 ml/h (1320 ml per day)  D/C Pro-stat  Provides 2376 kcal, 107 gm protein, 960 ml free water daily  NUTRITION DIAGNOSIS:   Increased nutrient needs related to (AKI requiring IHD) as evidenced by estimated needs.  Ongoing  GOAL:   Patient will meet greater than or equal to 90% of their needs  Met with TF  MONITOR:   TF tolerance, Diet advancement, Labs, Weight trends, I & O's  ASSESSMENT:   Patient with PMH significant for CAD with prior MI, HTN, cocaine abuse, and myasthenia gravis s/p thymectomy. Presents this admission with after being found next to his mailbox unresponsive, by stander CPR was initiated. Admitted for cardiac arrest presumed in the setting of ST elevation MI and 100% LAD lesion.   Cortrak placed 8/10, tip is post-pyloric. Currently receiving Nepro at 40 ml/h with Pro-stat 30 ml BID to provide 1928 kcal, 108 gm protein, 698 ml free water daily. Tolerating TF well to meet 88% of re-estimated kcal needs and 100% of estimated protein needs.  SLP following for swallowing treatments. Remains NPO. Swallowing issues suspected to be related to MG; Mestinon being started today.  Labs reviewed. Potassium 4.8 (WNL), Phosphorus 6 (H) CBG's: 100-109-94-103  Medications reviewed and include Aranesp, Novolog, Rena-vit.  No UOP I/O +4 L since admission. Last HD yesterday, next HD in the AM.  Intake/Output Summary (Last 24 hours) at 06/08/2018 1436 Last data filed at 06/08/2018 1400 Gross per 24 hour  Intake 1377.97 ml  Output -  Net 1377.97 ml    Diet Order:   Diet Order    None      EDUCATION NEEDS:   Not appropriate for education at this time  Skin:  Skin Assessment: Reviewed RN Assessment  Last BM:  8/12  Height:   Ht Readings from Last 1 Encounters:  05/29/18 '5\' 10"'$  (1.778 m)    Weight:   Wt Readings from Last  1 Encounters:  06/08/18 61.8 kg   05/24/18 177 lb 14.6 oz (80.7 kg)   Weight down since admission with diuresis.  Ideal Body Weight:  75.5 kg  BMI:  Body mass index is 19.55 kg/m.  Estimated Nutritional Needs:   Kcal:  2200-2400  Protein:  100-125 gm  Fluid:  1 L    Molli Barrows, RD, LDN, [place] Pager [phone] After Hours Pager [phone]

## 2018-06-08 NOTE — Progress Notes (Signed)
Subjective: Interval History: has no complaint.  Objective: Vital signs in last 24 hours: Temp:  [98 F (36.7 C)-99 F (37.2 C)] 98 F (36.7 C) (08/13 0347) Pulse Rate:  [55-87] 77 (08/13 0600) Resp:  [14-28] 21 (08/13 0500) BP: (104-173)/(58-95) 147/87 (08/13 0600) SpO2:  [93 %-100 %] 96 % (08/13 0500) Weight:  [57.3 kg-61.8 kg] 61.8 kg (08/13 0500) Weight change: -2.8 kg  Intake/Output from previous day: 08/12 0701 - 08/13 0700 In: 2414.5 [I.V.:325.5; Blood:612; NG/GT:1250; IV Piggyback:117] Out: 1000  Intake/Output this shift: No intake/output data recorded.  General appearance: cooperative, no distress, slowed mentation and conversant, not appropriate, obeys commands Resp: rales bibasilar Chest wall: RIJ PC Cardio: S1, S2 normal and systolic murmur: systolic ejection 2/6, decrescendo at 2nd left intercostal space GI: soft, liver down 4 cm Extremities: extremities normal, atraumatic, no cyanosis or edema  Lab Results: Recent Labs    06/07/18 0652  06/07/18 2031 06/08/18 0416  WBC 13.6*  --   --  7.5  HGB 7.5*   < > 8.0* 7.7*  HCT 23.1*   < > 24.1* 23.9*  PLT 300  --   --  207   < > = values in this interval not displayed.   BMET:  Recent Labs    06/07/18 0652 06/08/18 0416  NA 137 136  K 6.2* 4.8  CL 97* 97*  CO2 19* 27  GLUCOSE 121* 103*  BUN 160* 77*  CREATININE 11.30* 6.39*  CALCIUM 8.1* 7.9*   No results for input(s): PTH in the last 72 hours. Iron Studies:  Recent Labs    06/05/18 1855  IRON 17*  TIBC 321    Studies/Results: Ct Abdomen Pelvis Wo Contrast  Result Date: 06/06/2018 CLINICAL DATA:  Anemia. EXAM: CT ABDOMEN AND PELVIS WITHOUT CONTRAST TECHNIQUE: Multidetector CT imaging of the abdomen and pelvis was performed following the standard protocol without IV contrast. COMPARISON:  None. FINDINGS: Lower chest: Bilateral posterior lower lobe consolidation/atelectasis. Hepatobiliary: Unenhanced appearance of the liver is unremarkable. The  gallbladder appears to be contracted. Pancreas: Unremarkable. No pancreatic ductal dilatation or surrounding inflammatory changes. Spleen: Normal in size without focal abnormality. Adrenals/Urinary Tract: Unenhanced appearance of the adrenal glands and kidneys are unremarkable. No hydronephrosis. The bladder appears unremarkable. Stomach/Bowel: Feeding tube extends into the duodenum with the tip at the juncture of the second and third portions. Visualized bowel shows no evidence of obstruction or ileus. No inflammation identified. Rectal tube present. Vascular/Lymphatic: Calcified abdominal aorta without evidence of aneurysmal disease. 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 Reproductive: Prostate is unremarkable. Other: No evidence of retroperitoneal or intraperitoneal hemorrhage. No abnormal fluid collections or ascites. No hernias identified. Musculoskeletal: No acute or significant osseous findings. IMPRESSION: 1. No evidence of intraperitoneal or retroperitoneal hemorrhage. 2. No acute findings in the abdomen or pelvis by unenhanced CT. 3. Dilatation of the left common femoral artery up to 1.9 cm in maximal diameter. Electronically Signed   By: Aletta Edouard M.D.   On: 06/06/2018 13:42   Mr Brain Wo Contrast  Result Date: 06/07/2018 CLINICAL DATA:  52 y/o M; found down, unwitnessed arrest with CPR, status post hypothermia protocol. Patient with metabolic disarray, acid base imbalance, hypovolemia, and new onset delirium with incomprehensible speech 3 days ago. EXAM: MRI HEAD WITHOUT CONTRAST TECHNIQUE: Axial and coronal diffusion weighted sequences were acquired. The patient was unable to remain still and additional sequences were not acquired. COMPARISON:  05/22/2018 CT head  FINDINGS: Motion degraded axial and coronal diffusion weighted sequences. No gross diffusion abnormality to suggest acute or early subacute ischemia. On the low B value  sequence there are no findings of mass effect, hydrocephalus, or herniation. IMPRESSION: Motion degraded diffusion-weighted sequences. No gross diffusion abnormality to suggest acute or early subacute ischemia. No mass effect, hydrocephalus, or herniation. Repeat MRI of the brain is recommended when patient is able to remain still. Electronically Signed   By: Kristine Garbe M.D.   On: 06/07/2018 22:45   Dg Chest Port 1 View  Result Date: 06/07/2018 CLINICAL DATA:  Respiratory failure and shortness of breath. EXAM: PORTABLE CHEST 1 VIEW COMPARISON:  06/04/2018 and prior radiographs FINDINGS: Cardiomegaly, small bore feeding tube entering the stomach with tip off the field of view and RIGHT central venous catheter with tip overlying the SUPERIOR cavoatrial junction noted. Bilateral LOWER lung opacities/airspace disease again noted, slightly improved on the LEFT. No pneumothorax or large pleural effusion identified. IMPRESSION: Bilateral LOWER lung opacities/airspace disease, slightly improved on the LEFT. Feeding tube placement. No other significant change. Electronically Signed   By: Margarette Canada M.D.   On: 06/07/2018 07:14    I have reviewed the patient's current medications.  Assessment/Plan: 1 AKI oliguric ATN , ischemic and toxic with contrast.  Vol improving . No urine, will plan HD in am 2 CAD per Cards 3 CM low EF 4 Confusion 5 Anemia stable on Fe /esa 6 Substance abuse P Hd in am, control vol, esa, Fe    LOS: 17 days   Shaya Reddick 06/08/2018,7:10 AM

## 2018-06-08 NOTE — Progress Notes (Signed)
Physical Therapy Treatment Patient Details Name: Howard Thompson MRN: [id number] DOB: 1966/01/24 Today's Date: 06/08/2018    History of Present Illness 52 y.o. male out of hospital cardiac arrest (05/22/18), Intubated 7/27- 8/2.  s/p IJ tunneled HD catheter placed 05/31/18 with bleeding from IJ site. Pt with anoxic encephalopathy.Marland Kitchen PMHx: myasthenia gravis post thymus resection, HTN, anterior STEMI post LAD stent.     PT Comments    Pt with improved cognition today and able to follow simple commands. Pt limited in his mobility by increased rib pain with movement. Pt educated on slow breathing to decrease rib pain. Pt requires mod A for bed mobility and maxA for stand pivot to recliner. D/c plans remain appropriate at this time. PT to follow acutely.      Follow Up Recommendations  CIR;Supervision/Assistance - 24 hour     Equipment Recommendations  Other (comment)(to be determined at next venue)       Precautions / Restrictions Precautions Precautions: Fall Precaution Comments: suboccipital pressure wound, watch SpO2 Restrictions Weight Bearing Restrictions: No    Mobility  Bed Mobility Overal bed mobility: Needs Assistance Bed Mobility: Supine to Sit     Supine to sit: Mod assist     General bed mobility comments: due to complaints of rib pain and attentional deficits  Transfers Overall transfer level: Needs assistance   Transfers: Sit to/from Stand;Stand Pivot Transfers Sit to Stand: Max assist;Mod assist Stand pivot transfers: Max assist              Balance Overall balance assessment: Needs assistance   Sitting balance-Leahy Scale: Fair       Standing balance-Leahy Scale: Poor                              Cognition Arousal/Alertness: Lethargic;Awake/alert(lethargic initially ) Behavior During Therapy: Restless Overall Cognitive Status: Impaired/Different from baseline Area of Impairment:  Orientation;Attention;Memory;Safety/judgement;Following commands;Awareness;Problem solving                 Orientation Level: Disoriented to;Time;Situation Current Attention Level: Sustained Memory: Decreased recall of precautions;Decreased short-term memory Following Commands: Follows one step commands consistently Safety/Judgement: Decreased awareness of safety;Decreased awareness of deficits Awareness: Intellectual Problem Solving: Slow processing;Difficulty sequencing;Requires verbal cues;Requires tactile cues General Comments: improved cognition from last session         General Comments General comments (skin integrity, edema, etc.): VSS      Pertinent Vitals/Pain Pain Assessment: Faces Faces Pain Scale: Hurts even more Pain Location: ribs Pain Descriptors / Indicators: Grimacing;Guarding Pain Intervention(s): Limited activity within patient's tolerance;Monitored during session;Repositioned           PT Goals (current goals can now be found in the care plan section) Acute Rehab PT Goals 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  PT Goal Formulation: With family Time For Goal Achievement: 06/15/18 Potential to Achieve Goals: Fair Progress towards PT goals: Progressing toward goals    Frequency    Min 3X/week      PT Plan Current plan remains appropriate    Co-evaluation PT/OT/SLP Co-Evaluation/Treatment: Yes Reason for Co-Treatment: Complexity of the patient's impairments (multi-system involvement) PT goals addressed during session: Mobility/safety with mobility;Balance OT goals addressed during session: ADL's and self-care      AM-PAC PT "6 Clicks" Daily Activity  Outcome Measure  Difficulty turning over in bed (including adjusting bedclothes, sheets and blankets)?: Unable Difficulty moving from lying on back to sitting on the side of the bed? : Unable  Difficulty sitting down on and standing up from a chair with arms (e.g., wheelchair, bedside  commode, etc,.)?: Unable Help needed moving to and from a bed to chair (including a wheelchair)?: A Lot Help needed walking in hospital room?: Total Help needed climbing 3-5 steps with a railing? : Total 6 Click Score: 7    End of Session Equipment Utilized During Treatment: Gait belt Activity Tolerance: Patient limited by pain Patient left: in chair;with call bell/phone within reach;with chair alarm set;with nursing/sitter in room Nurse Communication: Mobility status PT Visit Diagnosis: Unsteadiness on feet (R26.81);Muscle weakness (generalized) (M62.81);Other abnormalities of gait and mobility (R26.89);Other symptoms and signs involving the nervous system (H82.993)     Time: 7169-6789 PT Time Calculation (min) (ACUTE ONLY): 25 min  Charges:  $Therapeutic Activity: 8-22 mins                     Jo Booze B. Migdalia Dk PT, DPT Acute Rehabilitation  [phone] Pager [phone]     [place] 06/08/2018, 12:41 PM

## 2018-06-08 NOTE — Progress Notes (Signed)
  Speech Language Pathology Treatment: Dysphagia  Patient Details Name: Howard Thompson MRN: [id number] DOB: 11-24-1965 Today's Date: 06/08/2018 Time: 1552-0802 SLP Time Calculation (min) (ACUTE ONLY): 21 min  Assessment / Plan / Recommendation Clinical Impression  Pt is more alert this morning compared to yesterday afternoon. RN reports no overt signs of aspiration but questionable oral deficits with ice chips per night shift. Today his sustained attention impacts his acceptance but his oral preparation appears appropriate. He does have what appears to be effortful swallows and sometimes multiple subswallows per bolus. Neurology is concerned about MG impacting his fatigue as the day continues and is planning to start mestinon today. Per discussed with MD, will continue with ice chips for today but will f/u on next date as able when pt will have had several doses to assess for potential to restart PO diet.   HPI HPI: Howard Thompson is a 52 y.o. male admitted on 7/27 s/p unwitnessed out of hospital cardiac arrest, VF, unknown downtime, past medical history of coronary artery disease, myasthenia gravis, hypertension.  Per wife, pt has had hx of dysphagia related to MG which is largely resolved.  Pt was intubated from 7/27 - 8/2.      SLP Plan  Continue with current plan of care       Recommendations  Diet recommendations: Other(comment)(ice chips after oral care) Medication Administration: Via alternative means                Oral Care Recommendations: Oral care QID Follow up Recommendations: Skilled Nursing facility SLP Visit Diagnosis: Dysphagia, oropharyngeal phase (R13.12) Plan: Continue with current plan of care       GO                Germain Osgood 06/08/2018, 9:55 AM  Germain Osgood, M.A. CCC-SLP [phone]

## 2018-06-09 ENCOUNTER — Inpatient Hospital Stay (HOSPITAL_COMMUNITY): Payer: No Typology Code available for payment source

## 2018-06-09 DIAGNOSIS — G92 Toxic encephalopathy: Secondary | ICD-10-CM

## 2018-06-09 DIAGNOSIS — I340 Nonrheumatic mitral (valve) insufficiency: Secondary | ICD-10-CM

## 2018-06-09 DIAGNOSIS — R195 Other fecal abnormalities: Secondary | ICD-10-CM

## 2018-06-09 LAB — CULTURE, BLOOD (ROUTINE X 2)
Culture: NO GROWTH
Culture: NO GROWTH
Special Requests: ADEQUATE

## 2018-06-09 LAB — GLUCOSE, CAPILLARY
GLUCOSE-CAPILLARY: 101 mg/dL — AB (ref 70–99)
GLUCOSE-CAPILLARY: 114 mg/dL — AB (ref 70–99)
GLUCOSE-CAPILLARY: 109 mg/dL — AB (ref 70–99)
GLUCOSE-CAPILLARY: 105 mg/dL — AB (ref 70–99)
Glucose-Capillary: 118 mg/dL — ABNORMAL HIGH (ref 70–99)
Glucose-Capillary: 92 mg/dL (ref 70–99)
Glucose-Capillary: 99 mg/dL (ref 70–99)

## 2018-06-09 LAB — CBC WITH DIFFERENTIAL/PLATELET
ABS IMMATURE GRANULOCYTES: 0.1 10*3/uL (ref 0.0–0.1)
Basophils Absolute: 0.1 10*3/uL (ref 0.0–0.1)
Basophils Relative: 1 %
EOS ABS: 0.1 10*3/uL (ref 0.0–0.7)
Eosinophils Relative: 2 %
HEMATOCRIT: 21.8 % — AB (ref 39.0–52.0)
HEMOGLOBIN: 7 g/dL — AB (ref 13.0–17.0)
Immature Granulocytes: 1 %
LYMPHS ABS: 0.7 10*3/uL (ref 0.7–4.0)
LYMPHS PCT: 8 %
MCH: 28.2 pg (ref 26.0–34.0)
MCHC: 32.1 g/dL (ref 30.0–36.0)
MCV: 87.9 fL (ref 78.0–100.0)
MONOS PCT: 10 %
Monocytes Absolute: 0.9 10*3/uL (ref 0.1–1.0)
Neutro Abs: 7 10*3/uL (ref 1.7–7.7)
Neutrophils Relative %: 78 %
Platelets: 224 10*3/uL (ref 150–400)
RBC: 2.48 MIL/uL — AB (ref 4.22–5.81)
RDW: 19.8 % — AB (ref 11.5–15.5)
WBC: 8.8 10*3/uL (ref 4.0–10.5)

## 2018-06-09 LAB — RENAL FUNCTION PANEL
Albumin: 2 g/dL — ABNORMAL LOW (ref 3.5–5.0)
Anion gap: 16 — ABNORMAL HIGH (ref 5–15)
BUN: 140 mg/dL — ABNORMAL HIGH (ref 6–20)
CHLORIDE: 97 mmol/L — AB (ref 98–111)
CO2: 23 mmol/L (ref 22–32)
CREATININE: 8.96 mg/dL — AB (ref 0.61–1.24)
Calcium: 8.1 mg/dL — ABNORMAL LOW (ref 8.9–10.3)
GFR calc Af Amer: 7 mL/min — ABNORMAL LOW (ref >60)
GFR calc non Af Amer: 6 mL/min — ABNORMAL LOW (ref >60)
Glucose, Bld: 113 mg/dL — ABNORMAL HIGH (ref 70–99)
POTASSIUM: 5.1 mmol/L (ref 3.5–5.1)
Phosphorus: 7.1 mg/dL — ABNORMAL HIGH (ref 2.5–4.6)
Sodium: 136 mmol/L (ref 135–145)

## 2018-06-09 LAB — MAGNESIUM: Magnesium: 2.9 mg/dL — ABNORMAL HIGH (ref 1.7–2.4)

## 2018-06-09 LAB — HEPATIC FUNCTION PANEL
ALK PHOS: 107 U/L (ref 38–126)
ALT: 13 U/L (ref 0–44)
AST: 35 U/L (ref 15–41)
Albumin: 2 g/dL — ABNORMAL LOW (ref 3.5–5.0)
BILIRUBIN DIRECT: 0.1 mg/dL (ref 0.0–0.2)
BILIRUBIN INDIRECT: 0.7 mg/dL (ref 0.3–0.9)
Total Bilirubin: 0.8 mg/dL (ref 0.3–1.2)
Total Protein: 5.4 g/dL — ABNORMAL LOW (ref 6.5–8.1)

## 2018-06-09 LAB — PREPARE RBC (CROSSMATCH)

## 2018-06-09 LAB — ECHOCARDIOGRAM LIMITED
Height: 70 in
Weight: 2042.34 oz

## 2018-06-09 LAB — OCCULT BLOOD X 1 CARD TO LAB, STOOL: Fecal Occult Bld: POSITIVE — AB

## 2018-06-09 MED ORDER — SODIUM CHLORIDE 0.9% IV SOLUTION: Freq: Once | INTRAVENOUS | Status: DC

## 2018-06-09 MED ORDER — FAMOTIDINE IN NACL 20-0.9 MG/50ML-% IV SOLN
20.0000 mg | Freq: Every day | INTRAVENOUS | Status: DC
  Administered 2018-06-09: 20 mg via INTRAVENOUS
  Filled 2018-06-09: qty 50

## 2018-06-09 MED ORDER — SODIUM CHLORIDE 0.9 % IV SOLN: 100.0000 mL | INTRAVENOUS | Status: DC | PRN

## 2018-06-09 MED ORDER — FAMOTIDINE IN NACL 20-0.9 MG/50ML-% IV SOLN
20.0000 mg | Freq: Two times a day (BID) | INTRAVENOUS | Status: DC
  Administered 2018-06-09 – 2018-06-10 (×3): 20 mg via INTRAVENOUS
  Filled 2018-06-09 (×4): qty 50

## 2018-06-09 MED ORDER — HEPARIN SODIUM (PORCINE) 1000 UNIT/ML DIALYSIS
40.0000 [IU]/kg | Freq: Once | INTRAMUSCULAR | Status: DC
  Filled 2018-06-09: qty 3

## 2018-06-09 MED ORDER — FENTANYL CITRATE (PF) 100 MCG/2ML IJ SOLN
25.0000 ug | Freq: Once | INTRAMUSCULAR | Status: AC
  Administered 2018-06-09: 25 ug via INTRAVENOUS
  Filled 2018-06-09: qty 2

## 2018-06-09 MED ORDER — ALTEPLASE 2 MG IJ SOLR
2.0000 mg | Freq: Once | INTRAMUSCULAR | Status: DC | PRN
  Filled 2018-06-09: qty 2

## 2018-06-09 MED ORDER — PYRIDOSTIGMINE BROMIDE 60 MG PO TABS
60.0000 mg | ORAL_TABLET | Freq: Three times a day (TID) | ORAL | Status: DC
  Administered 2018-06-09 – 2018-06-17 (×19): 60 mg via ORAL
  Filled 2018-06-09 (×29): qty 1

## 2018-06-09 MED ORDER — FAMOTIDINE IN NACL 20-0.9 MG/50ML-% IV SOLN
20.0000 mg | Freq: Every day | INTRAVENOUS | Status: DC
  Filled 2018-06-09: qty 50

## 2018-06-09 MED ORDER — HEPARIN SODIUM (PORCINE) 1000 UNIT/ML DIALYSIS
1000.0000 [IU] | INTRAMUSCULAR | Status: DC | PRN
  Filled 2018-06-09: qty 1

## 2018-06-09 NOTE — Progress Notes (Signed)
[facility] Progress Note Patient Name: Howard Thompson DOB: April 14, 1966 MRN: [id number]   Date of Service  06/09/2018  HPI/Events of Note  Anemia - Hgb = 7.0 (which has drifted down from 7.7)  eICU Interventions  Will transfuse 1 unit PRBC now.      Intervention Category Major Interventions: Other:  Sommer,Steven Cornelia Copa 06/09/2018, 6:11 AM

## 2018-06-09 NOTE — H&P (View-Only) (Signed)
Consultation  Referring Provider:  Critical care / Ramaswamy Primary Care Physician:  No primary care provider on file. Primary Gastroenterologist:  None unassigned  Reason for Consultation:  Anemia, heme positive stool, drifting hgb  HPI: Howard Thompson is a 52 y.o. male who was admitted on 05/22/2018 after an out of hospital V. fib arrest.  He was found down at home outside.  He was determined to have had an STE MI and was taken directly to the Cath Lab where drug-eluting stent was placed to the LAD for 100% occlusion.  Course complicated by cardiogenic shock and acute renal failure.  He has required CRRT, and is now on dialysis. He was  noticed early on in his course to have some bleeding from the stomach with OG suction. Has history of prior coronary artery disease, hypertension, myasthenia gravis, and polysubstance abuse with EtOH and cocaine. Now with anoxic encephalopathy, and has gone through EtOH and cocaine withdrawal.  He remains confused agitated and has been requiring Precedex. Current EF 25 to 30%. He is on Brilinta and aspirin. No prior GI evaluation found in epic. CT of the abdomen and pelvis was done on 06/06/2018 with no contrast.  There is no evidence of intra-abdominal or retroperitoneal bleeding. He  has been felt to have some component of blood loss secondary to hemodialysis catheter. Labs reviewed, hemoglobin was normal on admit, with gradual drift to 6.1 on 06/06/2018.  Iron studies at that time were consistent with iron deficiency iron studies 3 days/year prior were within normal limits.  He has received Feraheme. Being transfused periodically to keep his hemoglobin in the 7 range. Labs today hemoglobin 7 hematocrit of 21.8 MCV of 87. On IV Pepcid once daily  Pt awakens briefly then falls back asleep , unable to provide any hx He has not had any vomiting, no coffee ground emesis, nasal feeding tube in place, passing flatus per nursing, no stool Stool  documented  heme + yesterday    History reviewed. No pertinent past medical history.  Past Surgical History:  Procedure Laterality Date  . CORONARY STENT INTERVENTION N/A 05/22/2018   Procedure: CORONARY STENT INTERVENTION;  Surgeon: Lorretta Harp, MD;  Location: [facility];  Service: Cardiovascular;  Laterality: N/A;  . IR FLUORO GUIDE CV LINE RIGHT  05/31/2018  . IR US GUIDE VASC ACCESS RIGHT  05/31/2018  . LEFT HEART CATH AND CORONARY ANGIOGRAPHY N/A 05/22/2018   Procedure: LEFT HEART CATH AND CORONARY ANGIOGRAPHY;  Surgeon: Lorretta Harp, MD;  Location: [facility];  Service: Cardiovascular;  Laterality: N/A;    Prior to Admission medications   Medication Sig Start Date End Date Taking? Authorizing Provider  nitroGLYCERIN (NITROSTAT) 0.4 MG SL tablet Place 0.4 mg under the tongue every 5 (five) minutes as needed for chest pain.   Yes [provider]    Current Facility-Administered Medications  Medication Dose Route Frequency Provider Last Rate Last Dose  . 0.9 %  sodium chloride infusion (Manually program via Guardrails IV Fluids)   Intravenous Once Brand Males, MD      . 0.9 %  sodium chloride infusion (Manually program via Guardrails IV Fluids)   Intravenous Once Anders Simmonds, MD      . 0.9 %  sodium chloride infusion  250 mL Intravenous PRN Hammonds, Sharyn Blitz, MD 10 mL/hr at 06/05/18 2138 250 mL at 06/05/18 2138  . 0.9 %  sodium chloride infusion  100 mL Intravenous PRN Mauricia Area, MD      .  0.9 %  sodium chloride infusion  100 mL Intravenous PRN Deterding, Jeneen Rinks, MD      . albuterol (PROVENTIL) (2.5 MG/3ML) 0.083% nebulizer solution 2.5 mg  2.5 mg Nebulization Q2H PRN Ollis, Brandi L, NP      . alteplase (CATHFLO ACTIVASE) injection 2 mg  2 mg Intracatheter Once PRN Mauricia Area, MD      . aspirin chewable tablet 81 mg  81 mg Per Tube Daily Lorretta Harp, MD   81 mg at 06/09/18 0906  . atorvastatin (LIPITOR) tablet 20 mg  20 mg Oral q1800  Troy Sine, MD   20 mg at 06/08/18 1733  . carvedilol (COREG) tablet 6.25 mg  6.25 mg Per NG tube BID WC Fransico Him R, MD   6.25 mg at 06/09/18 0641  . chlorhexidine (PERIDEX) 0.12 % solution 15 mL  15 mL Mouth Rinse BID Brand Males, MD   15 mL at 06/09/18 0906  . Chlorhexidine Gluconate Cloth 2 % PADS 6 each  6 each Topical Q0600 Rexene Agent, MD   6 each at 06/08/18 1133  . Chlorhexidine Gluconate Cloth 2 % PADS 6 each  6 each Topical X3244 Mauricia Area, MD   6 each at 06/08/18 2200  . [START ON 06/10/2018] Darbepoetin Alfa (ARANESP) injection 200 mcg  200 mcg Intravenous Q Thu-HD Deterding, Jeneen Rinks, MD      . dexmedetomidine (PRECEDEX) 400 MCG/100ML (4 mcg/mL) infusion  0.4-1.2 mcg/kg/hr Intravenous Titrated Tarry Kos, MD 14.9 mL/hr at 06/09/18 0620 1 mcg/kg/hr at 06/09/18 0620  . docusate (COLACE) 50 MG/5ML liquid 100 mg  100 mg Per Tube BID PRN Hammonds, Sharyn Blitz, MD      . famotidine (PEPCID) IVPB 20 mg premix  20 mg Intravenous Daily Anders Simmonds, MD 100 mL/hr at 06/09/18 0911 20 mg at 06/09/18 0911  . feeding supplement (NEPRO CARB STEADY) liquid 1,000 mL  1,000 mL Per Tube Q24H Brand Males, MD 55 mL/hr at 06/09/18 0016 1,000 mL at 06/09/18 0016  . ferumoxytol (FERAHEME) 510 mg in sodium chloride 0.9 % 100 mL IVPB  510 mg Intravenous Q Alphonsa Gin, MD 468 mL/hr at 06/07/18 0926 510 mg at 06/07/18 0926  . Gerhardt's butt cream   Topical PRN Justin Mend, MD      . heparin injection 1,000 Units  1,000 Units Dialysis PRN Deterding, Jeneen Rinks, MD      . heparin injection 2,500 Units  40 Units/kg Dialysis Once in dialysis Deterding, Jeneen Rinks, MD      . hydrALAZINE (APRESOLINE) injection 10-20 mg  10-20 mg Intravenous Q4H PRN Brand Males, MD   15 mg at 06/07/18 0210  . insulin aspart (novoLOG) injection 0-9 Units  0-9 Units Subcutaneous Q4H Omar Person, NP   1 Units at 06/01/18 1230  . labetalol (NORMODYNE,TRANDATE) injection 20 mg  20  mg Intravenous Q4H PRN Tarry Kos, MD      . LORazepam (ATIVAN) injection 1-2 mg  1-2 mg Intravenous Q1H PRN Frederik Pear, MD   2 mg at 06/05/18 1115  . LORazepam (ATIVAN) tablet 1 mg  1 mg Oral TID Kipp Brood, MD   1 mg at 06/09/18 0907   Followed by  . [START ON 06/10/2018] LORazepam (ATIVAN) tablet 0.5 mg  0.5 mg Oral TID Kipp Brood, MD      . MEDLINE mouth rinse  15 mL Mouth Rinse q12n4p Brand Males, MD   15 mL at 06/07/18 1731  . multivitamin (RENA-VIT)  tablet 1 tablet  1 tablet Oral Nile Riggs, MD   1 tablet at 06/08/18 2243  . nitroGLYCERIN (NITROSTAT) SL tablet 0.4 mg  0.4 mg Sublingual Q5 min PRN Frederik Pear, MD   0.4 mg at 06/06/18 2023  . pyridostigmine (MESTINON) tablet 60 mg  60 mg Oral Q8H Amie Portland, MD      . QUEtiapine (SEROQUEL) tablet 50 mg  50 mg Oral BID Tarry Kos, MD   50 mg at 06/09/18 3557  . sodium chloride flush (NS) 0.9 % injection 3 mL  3 mL Intravenous Q12H Lorretta Harp, MD   3 mL at 06/09/18 0907  . sodium chloride flush (NS) 0.9 % injection 3 mL  3 mL Intravenous PRN Lorretta Harp, MD      . ticagrelor [facility]) tablet 90 mg  90 mg Per Tube BID Lorretta Harp, MD   90 mg at 06/09/18 0907    Allergies as of 05/22/2018  . (No Known Allergies)    History reviewed. No pertinent family history.  Social History   Socioeconomic History  . Marital status: Divorced    Spouse name: Not on file  . Number of children: Not on file  . Years of education: Not on file  . Highest education level: Not on file  Occupational History  . Not on file  Social Needs  . Financial resource strain: Not on file  . Food insecurity:    Worry: Not on file    Inability: Not on file  . Transportation needs:    Medical: Not on file    Non-medical: Not on file  Tobacco Use  . Smoking status: Current Every Day Smoker    Packs/day: 1.00    Types: Cigarettes  . Smokeless tobacco: Current User  Substance and  Sexual Activity  . Alcohol use: Not on file  . Drug use: Not on file  . Sexual activity: Not on file  Lifestyle  . Physical activity:    Days per week: Not on file    Minutes per session: Not on file  . Stress: Not on file  Relationships  . Social connections:    Talks on phone: Not on file    Gets together: Not on file    Attends religious service: Not on file    Active member of club or organization: Not on file    Attends meetings of clubs or organizations: Not on file    Relationship status: Not on file  . Intimate partner violence:    Fear of current or ex partner: Not on file    Emotionally abused: Not on file    Physically abused: Not on file    Forced sexual activity: Not on file  Other Topics Concern  . Not on file  Social History Narrative  . Not on file    ROS;  Pt unable to provide  Physical Exam: Vital signs in last 24 hours: Temp:  [98.1 F (36.7 C)-100 F (37.8 C)] 98.1 F (36.7 C) (08/14 0930) Pulse Rate:  [61-143] 66 (08/14 1000) Resp:  [0-32] 20 (08/14 1000) BP: (130-170)/(36-108) 156/84 (08/14 1000) SpO2:  [96 %-100 %] 100 % (08/14 0930) Weight:  [60.1 kg-60.4 kg] 60.4 kg (08/14 0655) Last BM Date: 06/08/18 General:   Alert,  Well-developed, well-nourished, pleasant and cooperative in NAD Head:  Normocephalic and atraumatic. Eyes:  Sclera clear, no icterus.   Conjunctiva pink. Ears:  Normal auditory acuity. Nose:  No deformity, discharge,  or lesions. Mouth:  No deformity or lesions.   Neck:  Supple; no masses or thyromegaly. Lungs:  Clear throughout to auscultation.   No wheezes, crackles, or rhonchi. Heart:  Regular rate and rhythm; no murmurs, clicks, rubs,  or gallops. Abdomen:  Soft,nontender, BS active,nonpalp mass or hsm.   Rectal:  Deferred  Msk:  Symmetrical without gross deformities. . Pulses:  Normal pulses noted. Extremities:  Without clubbing or edema. Neurologic:  Alert and  oriented x4;  grossly normal neurologically. Skin:   Intact without significant lesions or rashes.. Psych:  Alert and cooperative. Normal mood and affect.  Intake/Output from previous day: 08/13 0701 - 08/14 0700 In: 973.2 [I.V.:313.2; NG/GT:660] Out: -  Intake/Output this shift: Total I/O In: 415 [I.V.:100; Blood:315] Out: -   Lab Results: Recent Labs    06/07/18 0652  06/07/18 2031 06/08/18 0416 06/09/18 0339  WBC 13.6*  --   --  7.5 8.8  HGB 7.5*   < > 8.0* 7.7* 7.0*  HCT 23.1*   < > 24.1* 23.9* 21.8*  PLT 300  --   --  207 224   < > = values in this interval not displayed.   BMET Recent Labs    06/07/18 0652 06/08/18 0416 06/09/18 0339  NA 137 136 136  K 6.2* 4.8 5.1  CL 97* 97* 97*  CO2 19* 27 23  GLUCOSE 121* 103* 113*  BUN 160* 77* 140*  CREATININE 11.30* 6.39* 8.96*  CALCIUM 8.1* 7.9* 8.1*   LFT Recent Labs    06/09/18 0339  PROT 5.4*  ALBUMIN 2.0*  2.0*  AST 35  ALT 13  ALKPHOS 107  BILITOT 0.8  BILIDIR 0.1  IBILI 0.7      MPRESSION:   #1 52 yo WM with hx polysubstance abuse with ETOH and cocaine who is s/p VFib arrest, outside of hospital on 05/22/2018, he has had and STEMI, and underwent catheterization with drug-eluting stent to 100% occluded LAD on the day of admission. Anticoagulated with Brilinta and aspirin Course complicated by acute renal failure and cardiogenic shock. Also felt to have anoxic encephalopathy, and EtOH and cocaine withdrawal. Remains confused and is requiring Precedex for agitation.  #2 persistent renal failure, now on hemodialysis  #3 drop in hemoglobin from 14 on admission to a nadir of 6 on 06/06/2018.  Is requiring periodic transfusions.  No overt GI bleeding, but stool is heme positive and was noted to have blood from the stomach with OG suctioning early on in his course.  Also documented to have lost blood from hemodialysis catheter placement.  Suspect his anemia is multifactoral. So has had somewhat conflicting iron studies, has received Feraheme.  At high risk  for gastritis or peptic ulcer disease, could also have component of ischemic bowel. No prior GI work-up per chart.  #4 myasthenia gravis   Recommendation:   Patient is n.p.o. with failed swallowing eval. He does have Dobbhoff in place Will increase IV Pepcid to twice daily Transfuse to keep hemoglobin 7-8 Will need to discuss endoscopic evaluation with CCM, from GI perspective would prefer to start with a EGD, he may need to be intubated for sedation.  I discussed with CCM - they feel OK for EGD in endo unit with MAC - could wind up needing intubation - have scheduled for EGD tomorrow with Dr Tonny Bollman  06/09/2018, 10:27 AM      Attending physician's note   I have taken a history, examined the patient  and reviewed the chart. I agree with the Advanced Practitioner's note, impression and recommendations. Progressive anemia with heme + stool. Some blood in OG tube early in course. EGD to further evaluate. STEMI with LAD drug eluting stent placed, on Brilinta and ASA. Higher risk for cardiopulmonary complication with sedation, EGD. CCM has cleared him for EGD with MAC. The risks (including bleeding, perforation, infection, missed lesions, medication reactions and possible hospitalization or surgery if complications occur), benefits, and alternatives to endoscopy with possible biopsy and possible dilation were discussed with the patient and family in the room they consent to proceed.    Lucio Edward, MD FACG [phone] office

## 2018-06-09 NOTE — Procedures (Signed)
I was present at this session.  I have reviewed the session itself and made appropriate changes.  HD via PC.  To get blood.  No hep with GIB.  Vol xs  Howard Thompson 8/14/20197:13 AM

## 2018-06-09 NOTE — Progress Notes (Signed)
Physical Therapy Treatment Patient Details Name: Howard Thompson MRN: [id number] DOB: [birth year] Today's Date: 06/09/2018    History of Present Illness 52 y.o. male out of hospital cardiac arrest (05/22/18), Intubated 7/27- 8/2.  s/p IJ tunneled HD catheter placed 05/31/18 with bleeding from IJ site. Pt with anoxic encephalopathy.Marland Kitchen PMHx: myasthenia gravis post thymus resection, HTN, anterior STEMI post LAD stent.     PT Comments    Pt family is in room on entry and pt is awake and alert, willing to work with therapy. Pt is limited in safe mobility by orthostatic hypotension (see General Comments), as well as decreased cognition, strength and balance. Pt requires modAx2 for bed mobility and sit<>stand in [place]. D/c plans continue to remain appropriate once pt is medically stable. PT will continue to follow acutely.     Follow Up Recommendations  CIR;Supervision/Assistance - 24 hour     Equipment Recommendations  Other (comment)(to be determined at next venue)    Recommendations for Other Services       Precautions / Restrictions Precautions Precautions: Fall Precaution Comments: suboccipital pressure wound, watch SpO2 Restrictions Weight Bearing Restrictions: No    Mobility  Bed Mobility Overal bed mobility: Needs Assistance Bed Mobility: Supine to Sit     Supine to sit: Mod assist;+2 for physical assistance Sit to supine: Mod assist;+2 for physical assistance   General bed mobility comments: continues to have increased rib pain with movement and requires assist for trunk to upright and pad scoot of hips to EoB, modA fo rtrunk to bed surface and LE into bed  Transfers Overall transfer level: Needs assistance   Transfers: Sit to/from Stand;Stand Pivot Transfers Sit to Stand: Mod assist;+2 physical assistance         General transfer comment: modAx2 for 2x sit<>stand in [place], pt became orthostatic in standing      Balance Overall balance assessment: Needs assistance   Sitting balance-Leahy Scale: Fair       Standing balance-Leahy Scale: Poor Standing balance comment: able to maintain standing balance until he became dizzy                            Cognition Arousal/Alertness: Lethargic;Awake/alert(lethargic initially ) Behavior During Therapy: Restless;Flat affect Overall Cognitive Status: Impaired/Different from baseline Area of Impairment: Orientation;Attention;Memory;Safety/judgement;Following commands;Awareness;Problem solving                 Orientation Level: Disoriented to;Time;Situation Current Attention Level: Sustained Memory: Decreased recall of precautions;Decreased short-term memory Following Commands: Follows one step commands consistently Safety/Judgement: Decreased awareness of safety;Decreased awareness of deficits Awareness: Emergent Problem Solving: Slow processing;Difficulty sequencing;Requires verbal cues;Requires tactile cues General Comments: improved cognition from last session      Exercises      General Comments General comments (skin integrity, edema, etc.): Wife and family present at beginning of session, pt with orthstasis in standing BP in supine 139/69, in seated after standing 104/55, in supine 122/74, HR ranged from 68-100 bpm during session SaO2 on RA >88%O2 throughout session       Pertinent Vitals/Pain Pain Assessment: Faces Faces Pain Scale: Hurts whole lot Pain Location: ribs Pain Descriptors / Indicators: Grimacing;Guarding Pain Intervention(s): Limited activity within patient's tolerance;Monitored during session;Repositioned           PT Goals (current goals can now be found in the care plan section) Acute Rehab PT Goals 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 : Unable Difficulty sitting down on and standing up from a chair with arms (e.g., wheelchair, bedside commode, etc,.)?: Unable Help needed moving to and from a bed to chair (including a wheelchair)?: A Lot Help needed walking in hospital room?: Total Help needed climbing 3-5 steps with a railing? : Total 6 Click Score: 7    End of Session Equipment Utilized During Treatment: Gait belt Activity Tolerance: Patient limited by pain Patient left: in chair;with call bell/phone within reach;with chair alarm set;with nursing/sitter in room Nurse Communication: Mobility status PT Visit Diagnosis: Unsteadiness on feet (R26.81);Muscle weakness (generalized) (M62.81);Other abnormalities of gait and mobility (R26.89);Other symptoms and signs involving the nervous system (D35.701)     Time: 7793-9030 PT Time Calculation (min) (ACUTE ONLY): 17 min  Charges:  $Therapeutic Activity: 8-22 mins                     Cote Mayabb B. Migdalia Dk PT, DPT Acute Rehabilitation  [phone] Pager [phone]     [place] 06/09/2018, 4:13 PM

## 2018-06-09 NOTE — Progress Notes (Addendum)
Inpatient Rehabilitation  Met with patient and sister, Lelan Pons at bedside to discuss IP Rehab.  Shared booklets and answered initial questions about our program.  Discussed average length of stay ~2 weeks that focuses on rehab as well as caregiver training for an anticipated discharge home with 24/7 caregiver assist.  Lelan Pons stated that patient's spouse (seperated) Jacqlyn Larsen will be here tomorrow afternoon and I plan to meet with her then to clarify available support with her.  Plan to follow up with team after our meeting.  Chart reviewed and note progress with therapy as well as ongoing medical work-up at this time.  Call if questions.    Carmelia Roller., CCC/SLP Admission Coordinator  [place]  Cell [phone]

## 2018-06-09 NOTE — Progress Notes (Signed)
  Echocardiogram 2D Echocardiogram has been performed.  Darlina Sicilian M 06/09/2018, 2:59 PM

## 2018-06-09 NOTE — Progress Notes (Signed)
PULMONARY / CRITICAL CARE MEDICINE   Name: Howard Thompson MRN: [id number] DOB: 1966/08/24    ADMISSION DATE:  05/22/2018 CONSULTATION DATE: 06/05/2018 LOS 18 days  REFERRING MD: [facility]  CHIEF COMPLAINT: Post cardiac arrest.  Acute renal failure.  Delirium.  Polysubstance abuse. BRIEF  52 year old with history of tobacco abuse substance abuse alcohol abuse cocaine abuse who had out of hospital arrest 05/22/2018.  He has undergone cardiac cath with LAD stent.  He had blood loss anemia secondary to HD catheter tunnel placed his require a transfusion.  His main problem is agitation and delirium and is transferred to the intensive care unit on 06/05/2018 for use of Precedex.  There is some concern of questionable retroperitoneal bleed although his abdomen is soft and nontender.  Transfused 06/06/2018 and consideration for CT of the abdomen is being entertained.    STUDIES:    CULTURES: 06/04/2018 blood cultures obtained  ANTIBIOTICS:   SIGNIFICANT EVENTS: 7/27 cardiac cath  LINES/TUBES: Right tunneled HD catheter>>    EVENTS 06/06/18 - 65 year old who continues to have anemia with no apparent blood loss.  Delirium and agitation and combative behavior continue p.o. Issue   8/12 - on precedex. Still needing it due to agitated delirium. Not on vent on Room air. On precedex 1.2. Neuro wanting MRI - but agiation interfering. Anuria continues - on iHD - MWF. Currently on HD. S/p PRBC yesterday. No active bleeding     8/13 - per neuro -> myasthenic weakness by end of day cotnributing to fatigue and dysarthria and secondary agitation. Mestinon. Now sitting in chair. But remains on precedex and oriented.  No HD today    SUBJECTIVE/OVERNIGHT/INTERVAL HX 8/14 - per neuro ptossis and dysarthria better after mestinon but could be morning effect. Neuro planning to increase mestinon. Follows with neuro at [facility] as opd. Does not think patient is in MG crisis. Currently on iHD. Got agitated  overnight - went up on prcedex. Worse at night. Still overall delirium mightt be improiving  Stool OB positive andhg dropped to 7 -> 1 unit prbc ordered and Pepcid changed to IV  VITAL SIGNS: BP (!) 148/77 (BP Location: Left Arm)   Pulse 69   Temp 98.1 F (36.7 C) (Oral)   Resp 18   Ht 5\' 10"  (1.778 m)   Wt 60.4 kg Comment: bed scale  SpO2 100%   BMI 19.11 kg/m   HEMODYNAMICS:    VENTILATOR SETTINGS:    INTAKE / OUTPUT: I/O last 3 completed shifts: In: 1483.4 [I.V.:463.4; NG/GT:1020] Out: -   PHYSICAL EXAMINATION:    General Appearance:    Looks chronic decondtiooned and frail. Resting while on iHD  Head:    Normocephalic, without obvious abnormality, atraumatic  Eyes:    PERRL - yes, conjunctiva/corneas - cler      Ears:    Normal external ear canals, both ears  Nose:   NG tube - yes  Throat:  ETT TUBE - no , OG tube - no  Neck:   Supple,  No enlargement/tenderness/nodules     Lungs:     Clear to auscultation bilaterally, Vent  Chest wall:    No deformity  Heart:    S1 and S2 normal,+ murmur, CVP - no.  Pressors - no  Abdomen:     Soft, no masses, no organomegaly  Genitalia:    Not done  Rectal:   not done  Extremities:   Extremities- intact     Skin:   Intact in exposed areas .  Neurologic:   Sedation - precedex gtt -> RASS - sleepgin . Moves all 4s - yes when awake. CAM-ICU - intermittely confused/agitated          PULMONARY Recent Labs  Lab 06/04/18 0924  PHART 7.515*  PCO2ART 33.7  PO2ART 71.6*  HCO3 27.0  O2SAT 95.1    CBC Recent Labs  Lab 06/07/18 0652  06/07/18 2031 06/08/18 0416 06/09/18 0339  HGB 7.5*   < > 8.0* 7.7* 7.0*  HCT 23.1*   < > 24.1* 23.9* 21.8*  WBC 13.6*  --   --  7.5 8.8  PLT 300  --   --  207 224   < > = values in this interval not displayed.    COAGULATION Recent Labs  Lab 06/04/18 1044  INR 1.23    CARDIAC  No results for input(s): TROPONINI in the last 168 hours. No results for input(s): PROBNP  in the last 168 hours.   CHEMISTRY Recent Labs  Lab 06/05/18 0238  06/06/18 0425 06/07/18 0334 06/07/18 0652 06/08/18 0416 06/09/18 0339  NA 136   < > 138 136 137 136 136  K 4.9   < > 5.3* 5.8* 6.2* 4.8 5.1  CL 96*   < > 98 99 97* 97* 97*  CO2 27   < > 24 20* 19* 27 23  GLUCOSE 83   < > 107* 119* 121* 103* 113*  BUN 41*   < > 99* 156* 160* 77* 140*  CREATININE 5.45*   < > 8.89* 11.04* 11.30* 6.39* 8.96*  CALCIUM 8.2*   < > 8.0* 8.0* 8.1* 7.9* 8.1*  MG 2.3  --  2.9* 3.3*  --  2.5* 2.9*  PHOS 6.8*  6.8*  --  9.1* 9.6* 10.3* 6.0* 7.1*   < > = values in this interval not displayed.   Estimated Creatinine Clearance: 8.3 mL/min (A) (by C-G formula based on SCr of 8.96 mg/dL (H)).   LIVER Recent Labs  Lab 06/04/18 1044 06/05/18 0238 06/06/18 0425 06/07/18 0334 06/07/18 0652 06/08/18 0416 06/09/18 0339  AST  --  34 27 39  --  35 35  ALT  --  8 8 12   --  12 13  ALKPHOS  --  127* 112 144*  --  121 107  BILITOT  --  1.4* 0.8 1.0  --  0.9 0.8  PROT  --  5.6* 5.3* 5.7*  --  5.1* 5.4*  ALBUMIN  --  2.3*  2.3* 2.1*  2.1* 2.3* 2.3* 1.9*  1.9* 2.0*  2.0*  INR 1.23  --   --   --   --   --   --      INFECTIOUS Recent Labs  Lab 06/04/18 1047 06/04/18 1228 06/05/18 0238 06/06/18 0425  LATICACIDVEN 1.0  --   --   --   PROCALCITON  --  3.00 4.85 5.34     ENDOCRINE CBG (last 3)  Recent Labs    06/09/18 0008 06/09/18 0410 06/09/18 0719  GLUCAP 99 101* 114*         IMAGING x48h  - image(s) personally visualized  -   highlighted in bold Mr Brain Wo Contrast  Result Date: 06/07/2018 CLINICAL DATA:  52 y/o M; found down, unwitnessed arrest with CPR, status post hypothermia protocol. Patient with metabolic disarray, acid base imbalance, hypovolemia, and new onset delirium with incomprehensible speech 3 days ago. EXAM: MRI HEAD WITHOUT CONTRAST TECHNIQUE: Axial and coronal diffusion weighted sequences were acquired.  The patient was unable to remain still and  additional sequences were not acquired. COMPARISON:  05/22/2018 CT head FINDINGS: Motion degraded axial and coronal diffusion weighted sequences. No gross diffusion abnormality to suggest acute or early subacute ischemia. On the low B value sequence there are no findings of mass effect, hydrocephalus, or herniation. IMPRESSION: Motion degraded diffusion-weighted sequences. No gross diffusion abnormality to suggest acute or early subacute ischemia. No mass effect, hydrocephalus, or herniation. Repeat MRI of the brain is recommended when patient is able to remain still. Electronically Signed   By: Kristine Garbe M.D.   On: 06/07/2018 22:45   DISCUSSION: 52 year old with history of tobacco abuse substance abuse alcohol abuse cocaine abuse who had out of hospital arrest 05/22/2018.  He has undergone cardiac cath with LAD stent.  He had blood loss anemia secondary to HD catheter tunnel placed his require a transfusion.  His main problem is agitation and delirium and is transferred to the intensive care unit on 06/05/2018 for use of Precedex.  There is some concern of questionable retroperitoneal bleed although his abdomen is soft and nontender.  Transfused 06/06/2018 and consideration for CT of the abdomen is being entertained.  ASSESSMENT / PLAN:  PULMONARY A: History of tobacco abuse Most likely underlying COPD   - maintains resp status 06/09/2018 .   P:   Monitor  CARDIOVASCULAR A:  Out-of-hospital cardiac arrest Ventricular fibrillation Acute coronary syndrome Status post cardiac catheterization 05/22/2018 with stent LAD - On ASA and Brilinta for 12 months   maintaints bp/hr while on precedex. Mildly elevated BP - on corge  P:  Per cards - continue DAPT  Increase beta blockade - pharmacy to check with cards about change coreg to lopressor in setting of emphysema   RENAL Lab Results  Component Value Date   CREATININE 8.96 (H) 06/09/2018   CREATININE 6.39 (H) 06/08/2018    CREATININE 11.30 (H) 06/07/2018   Recent Labs  Lab 06/07/18 0652 06/08/18 0416 06/09/18 0339  K 6.2* 4.8 5.1    A:   Acute renal failure with anuria. On 1HD MWF. Last HD 06/08/18 and ongoing 06/10/18  P:   Per RENAL  GASTROINTESTINAL A:   Malnourished  P:   Tube feeds   HEMATOLOGIC Recent Labs    06/08/18 0416 06/09/18 0339  HGB 7.7* 7.0*    A:   Anemia most likely multifactorial with recent bleeding episode from placement of tunneled HD catheter right IJ and DAPT. No RP bleed on CT 06/06/18   - s/p 1 unit PRBC 05/25/18, 06/02/18, 06/03/18 , 06/06/18, 06/07/18 x2 units and 06/09/18 - 1unit planned  - Stool OB +    p:  - consult GI given repeated hgb dorps and prbc and no other bleeding source other than phlebotomy  -change goal hgb >/=  7gm% because LOS is 18 days (and is > 15 days since MI)  INFECTIOUS A:   Blood cultures x2 ordered on 06/05/2018  06/09/2018 - not on abx  P:   monitor   ENDOCRINE CBG (last 3)  Recent Labs    06/09/18 0008 06/09/18 0410 06/09/18 0719  GLUCAP 99 101* 114*    A:   No acute issues P:   Trend CBGs  NEUROLOGIC A: History of polysubstance abuse   History of myasthenia gravis   Agitation post cardiac arrest. Transfer to intensive care unit for Precedex on 06/05/2018   06/09/2018 - No change:  Improved agittaed delirium. Fatigue + - multofactorial. Neuro thinks Myasthenia playing  a role - patient takes it prn at home  P:   Precedex gtt - wean Start mestinon 06/08/18, increase mestinon dose 06/09/18 per neuro Can dol haldol prn if QTc allows RASS goal: 1 Neurology is following   MSK  A: deconditioned  P PT/OT consult   CODE     Code Status Orders  (From admission, onward)         Start     Ordered   05/23/18 2136  Limited resuscitation (code)  Continuous    Question Answer Comment  In the event of cardiac or respiratory ARREST: Initiate Code Blue, Call Rapid Response Yes   In the event of cardiac or  respiratory ARREST: Perform CPR No   In the event of cardiac or respiratory ARREST: Perform Intubation/Mechanical Ventilation Yes   In the event of cardiac or respiratory ARREST: Use NIPPV/BiPAp only if indicated Yes   In the event of cardiac or respiratory ARREST: Administer ACLS medications if indicated Yes   In the event of cardiac or respiratory ARREST: Perform Defibrillation or Cardioversion if indicated Yes      05/23/18 2135        Code Status History    Date Active Date Inactive Code Status Order ID Comments User Context   05/22/2018 1957 05/23/2018 2135 Full Code [id number]  Omar Person, NP Inpatient   05/22/2018 1956 05/22/2018 1957 Full Code [id number]  Reyne Dumas, MD Inpatient   05/22/2018 1940 05/22/2018 1956 Full Code [id number]  Lorretta Harp, MD Inpatient   05/22/2018 1940 05/22/2018 1940 Full Code [id number]  Cristina Gong, MD Inpatient        FAMILY  - Updates: 06/06/2018 Sister updated at bedside.  None at bedside 06/09/2018 (RN updated family 06/08/18 late pm when they came)     The patient is critically ill with multiple organ systems failure and requires high complexity decision making for assessment and support, frequent evaluation and titration of therapies, application of advanced monitoring technologies and extensive interpretation of multiple databases.   Critical Care Time devoted to patient care services described in this note is  30  Minutes. This time reflects time of care of this signee Dr Brand Males. This critical care time does not reflect procedure time, or teaching time or supervisory time of PA/NP/Med student/Med Resident etc but could involve care discussion time    Dr. Brand Males, M.D., [facility].C.P Pulmonary and Critical Care Medicine Staff Physician [facility] Pager: [phone], If no answer or between  15:00h - 7:00h: call [phone]  06/09/2018 8:17 AM

## 2018-06-09 NOTE — Progress Notes (Signed)
  Speech Language Pathology Treatment: Dysphagia  Patient Details Name: Howard Thompson MRN: [id number] DOB: [birth year] Today's Date: 06/09/2018 Time: 7017-7939 SLP Time Calculation (min) (ACUTE ONLY): 14 min  Assessment / Plan / Recommendation Clinical Impression  Pt remains confused, difficult to understand given combination of his accent and dysarthria. He awakens for PO trials but can quickly start dozing off during intake. Advanced trials were administered with coughing that started after boluses of puree, continuing through subsequent thin liquid boluses. This raises concern for residue and/or resultant decreased airway protection, although residue on recent MBS was mild. Mod cues were provided for volitional coughing to clear suspected penetrates/aspirates. Given clinical signs concerning for aspiration as well as fluctuating mentation, would continue NPO status except for ice chips after oral care.    HPI HPI: Howard Thompson is a 52 y.o. male admitted on 7/27 s/p unwitnessed out of hospital cardiac arrest, VF, unknown downtime, past medical history of coronary artery disease, myasthenia gravis, hypertension.  Per wife, pt has had hx of dysphagia related to MG which is largely resolved.  Pt was intubated from 7/27 - 8/2.      SLP Plan  Continue with current plan of care       Recommendations  Diet recommendations: NPO;Other(comment)(ice chips after oralc are) Medication Administration: Via alternative means                Oral Care Recommendations: Oral care QID Follow up Recommendations: Skilled Nursing facility SLP Visit Diagnosis: Dysphagia, oropharyngeal phase (R13.12) Plan: Continue with current plan of care       GO                Germain Osgood 06/09/2018, 9:07 AM  Germain Osgood, M.A. CCC-SLP [phone]

## 2018-06-09 NOTE — Progress Notes (Signed)
[facility] Progress Note Patient Name: Howard Thompson DOB: [birth year] MRN: [id number]   Date of Service  06/09/2018  HPI/Events of Note  Stool Heme Positive - Patient is on ASA and Brilinta.   eICU Interventions  Will order: 1. D/C Pepcid PO. 2. Pepcid 20 mg IV BID. 3. Bedside nurse to speak with Cardiology about stopping ASA and Brilinta.      Intervention Category Major Interventions: Other:  Lysle Dingwall 06/09/2018, 6:26 AM

## 2018-06-09 NOTE — Progress Notes (Signed)
CSW consulted for SNF placement as a backup to CIR. CSW went to speak with pt at bedside however was asleep. CSW informed by RN that pt has cortrak still (barrier to SNF placement if needed). CSW will continue to follow for further needs.  Howard Thompson, MSW, [facility] Clinical Social Worker [phone]

## 2018-06-09 NOTE — Progress Notes (Signed)
[name] DOB: 10/21/1966 MRN: [id number]   Date of Service  06/09/2018  HPI/Events of Note  Vague abdominal discomfort, requesting pain med. Says has some nausea and feels sick to stomach. camera'ed in   eICU Interventions  Fentanyl 25 mcg iv x1        Pancho Rushing 06/09/2018, 8:17 PM

## 2018-06-09 NOTE — Progress Notes (Signed)
Progress Note  Patient Name: Howard Thompson Date of Encounter: 06/09/2018  Primary Cardiologist: No primary care provider on file.  Subjective   Alert and talkative with [place] accent  Inpatient Medications    Scheduled Meds: . sodium chloride   Intravenous Once  . sodium chloride   Intravenous Once  . aspirin  81 mg Per Tube Daily  . atorvastatin  20 mg Oral q1800  . carvedilol  6.25 mg Per NG tube BID WC  . chlorhexidine  15 mL Mouth Rinse BID  . Chlorhexidine Gluconate Cloth  6 each Topical Q0600  . Chlorhexidine Gluconate Cloth  6 each Topical Q0600  . [START ON 06/10/2018] darbepoetin (ARANESP) injection - DIALYSIS  200 mcg Intravenous Q Thu-HD  . feeding supplement (NEPRO CARB STEADY)  1,000 mL Per Tube Q24H  . heparin  40 Units/kg Dialysis Once in dialysis  . insulin aspart  0-9 Units Subcutaneous Q4H  . LORazepam  1 mg Oral TID   Followed by  . [START ON 06/10/2018] LORazepam  0.5 mg Oral TID  . mouth rinse  15 mL Mouth Rinse q12n4p  . multivitamin  1 tablet Oral QHS  . pyridostigmine  60 mg Oral Q8H  . QUEtiapine  50 mg Oral BID  . sodium chloride flush  3 mL Intravenous Q12H  . ticagrelor  90 mg Per Tube BID   Continuous Infusions: . sodium chloride 250 mL (06/05/18 2138)  . sodium chloride    . sodium chloride    . dexmedetomidine (PRECEDEX) IV infusion 1 mcg/kg/hr (06/09/18 1245)  . famotidine (PEPCID) IV    . ferumoxytol 510 mg (06/07/18 0926)   PRN Meds: sodium chloride, sodium chloride, sodium chloride, albuterol, alteplase, docusate, Gerhardt's butt cream, heparin, hydrALAZINE, labetalol, LORazepam, nitroGLYCERIN, sodium chloride flush   Vital Signs    Vitals:   06/09/18 1130 06/09/18 1145 06/09/18 1200 06/09/18 1520  BP: (!) 155/73 (!) 157/76 (!) 125/93   Pulse: 63 69 73   Resp: 17 16 16    Temp:  98.5 F (36.9 C)  99.3 F (37.4 C)  TempSrc:  Oral  Oral  SpO2:  100% 98%   Weight:  57.9 kg    Height:        Intake/Output Summary (Last 24  hours) at 06/09/2018 1651 Last data filed at 06/09/2018 1500 Gross per 24 hour  Intake 1388.22 ml  Output 3815 ml  Net -2426.78 ml   Filed Weights   06/09/18 0415 06/09/18 0655 06/09/18 1145  Weight: 60.1 kg 60.4 kg 57.9 kg    Telemetry    SR - Personally Reviewed  Physical Exam   General: no acute distress Head: [place].AT Neck: JVD 7 cm Lungs: no wheezing Heart: RRR 1-2 systolic murmur Abdomen: BS +; soft Extremities: no edema Neuro: more alert, moves all extremities.  Labs    Chemistry Recent Labs  Lab 06/07/18 0334 06/07/18 0652 06/08/18 0416 06/09/18 0339  NA 136 137 136 136  K 5.8* 6.2* 4.8 5.1  CL 99 97* 97* 97*  CO2 20* 19* 27 23  GLUCOSE 119* 121* 103* 113*  BUN 156* 160* 77* 140*  CREATININE 11.04* 11.30* 6.39* 8.96*  CALCIUM 8.0* 8.1* 7.9* 8.1*  PROT 5.7*  --  5.1* 5.4*  ALBUMIN 2.3* 2.3* 1.9*  1.9* 2.0*  2.0*  AST 39  --  35 35  ALT 12  --  12 13  ALKPHOS 144*  --  121 107  BILITOT 1.0  --  0.9 0.8  GFRNONAA 5* 5* 9* 6*  GFRAA 5* 5* 11* 7*  ANIONGAP 17* 21* 12 16*     Hematology Recent Labs  Lab 06/07/18 0652  06/07/18 2031 06/08/18 0416 06/09/18 0339  WBC 13.6*  --   --  7.5 8.8  RBC 2.67*  --   --  2.74* 2.48*  HGB 7.5*   < > 8.0* 7.7* 7.0*  HCT 23.1*   < > 24.1* 23.9* 21.8*  MCV 86.5  --   --  87.2 87.9  MCH 28.1  --   --  28.1 28.2  MCHC 32.5  --   --  32.2 32.1  RDW 21.0*  --   --  19.9* 19.8*  PLT 300  --   --  207 224   < > = values in this interval not displayed.    Cardiac EnzymesNo results for input(s): TROPONINI in the last 168 hours. No results for input(s): TROPIPOC in the last 168 hours.   BNPNo results for input(s): BNP, PROBNP in the last 168 hours.   DDimer No results for input(s): DDIMER in the last 168 hours.   Lipid Panel     Component Value Date/Time   CHOL 125 05/22/2018 1745   TRIG 59 05/22/2018 2026   HDL 40 (L) 05/22/2018 1745   CHOLHDL 3.1 05/22/2018 1745   VLDL 11 05/22/2018 1745   LDLCALC 74  05/22/2018 1745    Radiology    Mr Brain Wo Contrast  Result Date: 06/07/2018 CLINICAL DATA:  52 y/o M; found down, unwitnessed arrest with CPR, status post hypothermia protocol. Patient with metabolic disarray, acid base imbalance, hypovolemia, and new onset delirium with incomprehensible speech 3 days ago. EXAM: MRI HEAD WITHOUT CONTRAST TECHNIQUE: Axial and coronal diffusion weighted sequences were acquired. The patient was unable to remain still and additional sequences were not acquired. COMPARISON:  05/22/2018 CT head FINDINGS: Motion degraded axial and coronal diffusion weighted sequences. No gross diffusion abnormality to suggest acute or early subacute ischemia. On the low B value sequence there are no findings of mass effect, hydrocephalus, or herniation. IMPRESSION: Motion degraded diffusion-weighted sequences. No gross diffusion abnormality to suggest acute or early subacute ischemia. No mass effect, hydrocephalus, or herniation. Repeat MRI of the brain is recommended when patient is able to remain still. Electronically Signed   By: Kristine Garbe M.D.   On: 06/07/2018 22:45    Cardiac Studies   Cath: 05/22/18    Ost LAD to Prox LAD lesion is 100% stenosed.  A drug-eluting stent was successfully placed using a STENT SYNERGY DES 3X20.  Post intervention, there is a 0% residual stenosis.  There is severe left ventricular systolic dysfunction.  LV end diastolic pressure is mildly elevated.  The left ventricular ejection fraction is less than 25% by visual estimate.   TTE: 05/23/18  Study Conclusions  - Left ventricle: The cavity size was normal. Systolic function was severely reduced. The estimated ejection fraction was in the range of 25% to 30%. Severe diffuse hypokinesis with regional variations. There is akinesis of the apicalanterior and apical myocardium. There is akinesis of the midanteroseptal myocardium. There is severe hypokinesis of the  inferoseptal myocardium. Features are consistent with a pseudonormal left ventricular filling pattern, with concomitant abnormal relaxation and increased filling pressure (grade 2 diastolic dysfunction). Doppler parameters are consistent with high ventricular filling pressure. - Aortic valve: A bicuspid morphology cannot be excluded; mildly thickened, mildly calcified leaflets. There was mild to moderate regurgitation. Regurgitation pressure half-time: 718  ms. - Mitral valve: There was mild to moderate regurgitation. Valve area by pressure half-time: 2.39 cm^2. - Right ventricle: Systolic function was moderately reduced. - Atrial septum: There was increased thickness of the septum, consistent with lipomatous hypertrophy. - Tricuspid valve: There was mild regurgitation. - Pulmonary arteries: PA peak pressure: 33 mm Hg (S). - Inferior vena cava: The vessel was dilated. The respirophasic diameter changes were blunted (<50%), consistent with elevated central venous pressure. - Pericardium, extracardiac: A small, free-flowing pericardial effusion was identified circumferential to the heart. The fluid had no internal echoes.There was no evidence of hemodynamic compromise.  Impressions:  - severe LV dysfunction with diffuse HK but apica/anteroseptal and apical anterior AK. Recommend definity contrast study to rule out apical thrombus.   ------------------------------------------------------------------- 06/09/2018 ECHO Study Conclusions  - Left ventricle: The cavity size was normal. Systolic function was   mildly to moderately reduced. The estimated ejection fraction was   in the range of 40% to 45%. Images were inadequate for LV wall   motion assessment. The study is not technically sufficient to   allow evaluation of LV diastolic function. - Aortic valve: There was mild regurgitation. - Mitral valve: Calcified annulus. There was mild  regurgitation. - Atrial septum: There was increased thickness of the septum,   consistent with lipomatous hypertrophy. - Pulmonary arteries: Systolic pressure could not be accurately   estimated.  Recommendations:  Suggest definity contrast study to evaluate wall motion. Complications:  Apical images were extremely painful for the patient due to his broken ribs.  Patient Profile     52 y.o. male with anout of hospital cardiac arrest, prior history of MI several years ago, myasthenia gravis post thymus resection, anterior ST elevation post LAD stent. Extubated on 8/2.  Assessment & Plan    1. Cardiac arrest: Underwent cath with PCI/DES x1 to the pLADon 05/22/18. Planned for DAPT with ASA/Brilinta for 12 months.  -Continue  ASA 81 mg and Brilinta 90 mg twice daily per NG tube as he is not taking p.o. due to delirium.  -in light of h/o cocaine use, lopressor changedto carvedilol 6.25mg  BID per NGT  2. ICM -EF noted at 25-30% on echo  05/23/2018;  Repeat today shows significant improvement with EF 40 - 45%, mild MR -No room for ARB/ACEi given AKF -continue carvedilol  3. Anoxic encephalopathy - EEG was abnormal but repeat now improving. -Neurofollowing and on Seroquel/Ativan, and precedex gtt. More alert today. Mestinin restarted with MG.  4. Acute renal failure: -Nephrology following.  -S/p placement of new dialysis catheter right subclavian.  -Creatinine today 8.96 -per nephrology  5. HLD: now back on statin; LFTs normal  5. Polysubstance abuse:+ for cocaine on admission.   6. Acute blood loss anemia felt to be 2/2 to bleeding HD cath: - CT abd negative for acute bleed - Hgb maintaining around 7.7. Receive IV iron.   - Ideally would like Hgb above 8 given coronary issues but need to balance with volume issues. Continue to monitor Hgb with dialysis.  - evaluated by Dr. Fuller Plan of GI today for possible endoscopy  7. Systolic heart murmur at the apex -2D echo  05/23/2018 showed mild to moderate mitral regurgitation and mild to moderate aortic insufficiency, improved on most recent echo.   Signed, Shelva Majestic, MD  06/09/2018, 4:51 PM    For questions or updates, please contact [place] Please consult [email or web address] for contact info under Cardiology/STEMI.

## 2018-06-09 NOTE — Progress Notes (Signed)
Subjective: Interval History: has no complaint, answers some questions, not coop or oriented.  Objective: Vital signs in last 24 hours: Temp:  [98.2 F (36.8 C)-100 F (37.8 C)] 99 F (37.2 C) (08/14 0413) Pulse Rate:  [66-94] 94 (08/14 0600) Resp:  [0-32] 16 (08/14 0600) BP: (133-170)/(72-98) 162/85 (08/14 0600) SpO2:  [96 %-100 %] 98 % (08/14 0530) Weight:  [60.1 kg] 60.1 kg (08/14 0415) Weight change: 2.8 kg  Intake/Output from previous day: 08/13 0701 - 08/14 0700 In: 973.2 [I.V.:313.2; NG/GT:660] Out: -  Intake/Output this shift: No intake/output data recorded.  General appearance: pale, slowed mentation and as above, not coop or interactive Resp: rales bibasilar Chest wall: RIJ cath Cardio: S1, S2 normal and systolic murmur: holosystolic 2/6, blowing at apex GI: pos bs, liver down 5 cm , soft Extremities: extremities normal, atraumatic, no cyanosis or edema  Lab Results: Recent Labs    06/08/18 0416 06/09/18 0339  WBC 7.5 8.8  HGB 7.7* 7.0*  HCT 23.9* 21.8*  PLT 207 224   BMET:  Recent Labs    06/08/18 0416 06/09/18 0339  NA 136 136  K 4.8 5.1  CL 97* 97*  CO2 27 23  GLUCOSE 103* 113*  BUN 77* 140*  CREATININE 6.39* 8.96*  CALCIUM 7.9* 8.1*   No results for input(s): PTH in the last 72 hours. Iron Studies: No results for input(s): IRON, TIBC, TRANSFERRIN, FERRITIN in the last 72 hours.  Studies/Results: Mr Brain Wo Contrast  Result Date: 06/07/2018 CLINICAL DATA:  52 y/o M; found down, unwitnessed arrest with CPR, status post hypothermia protocol. Patient with metabolic disarray, acid base imbalance, hypovolemia, and new onset delirium with incomprehensible speech 3 days ago. EXAM: MRI HEAD WITHOUT CONTRAST TECHNIQUE: Axial and coronal diffusion weighted sequences were acquired. The patient was unable to remain still and additional sequences were not acquired. COMPARISON:  05/22/2018 CT head FINDINGS: Motion degraded axial and coronal diffusion  weighted sequences. No gross diffusion abnormality to suggest acute or early subacute ischemia. On the low B value sequence there are no findings of mass effect, hydrocephalus, or herniation. IMPRESSION: Motion degraded diffusion-weighted sequences. No gross diffusion abnormality to suggest acute or early subacute ischemia. No mass effect, hydrocephalus, or herniation. Repeat MRI of the brain is recommended when patient is able to remain still. Electronically Signed   By: Kristine Garbe M.D.   On: 06/07/2018 22:45    I have reviewed the patient's current medications.  Assessment/Plan: 1 ATN toxic and ischemic. Oligurric.  Donah Driver with GIB.  For HD.  Vol xs mod. 2 MI/CAD per cards 3 GIB need to deal with 4 Aggitation on Precedex 5 Substance abuse 6 Anemia GI and REnal P HD, esa, Fe, ? Endo, Transfuse    LOS: 18 days   Jeneen Rinks Waldemar Siegel 06/09/2018,7:09 AM

## 2018-06-09 NOTE — Consult Note (Addendum)
Consultation  Referring Provider:  Critical care / Ramaswamy Primary Care Physician:  No primary care provider on file. Primary Gastroenterologist:  None unassigned  Reason for Consultation:  Anemia, heme positive stool, drifting hgb  HPI: Howard Thompson is a 52 y.o. male who was admitted on 05/22/2018 after an out of hospital V. fib arrest.  He was found down at home outside.  He was determined to have had an STE MI and was taken directly to the Cath Lab where drug-eluting stent was placed to the LAD for 100% occlusion.  Course complicated by cardiogenic shock and acute renal failure.  He has required CRRT, and is now on dialysis. He was  noticed early on in his course to have some bleeding from the stomach with OG suction. Has history of prior coronary artery disease, hypertension, myasthenia gravis, and polysubstance abuse with EtOH and cocaine. Now with anoxic encephalopathy, and has gone through EtOH and cocaine withdrawal.  He remains confused agitated and has been requiring Precedex. Current EF 25 to 30%. He is on Brilinta and aspirin. No prior GI evaluation found in epic. CT of the abdomen and pelvis was done on 06/06/2018 with no contrast.  There is no evidence of intra-abdominal or retroperitoneal bleeding. He  has been felt to have some component of blood loss secondary to hemodialysis catheter. Labs reviewed, hemoglobin was normal on admit, with gradual drift to 6.1 on 06/06/2018.  Iron studies at that time were consistent with iron deficiency iron studies 3 days/year prior were within normal limits.  He has received Feraheme. Being transfused periodically to keep his hemoglobin in the 7 range. Labs today hemoglobin 7 hematocrit of 21.8 MCV of 87. On IV Pepcid once daily  Pt awakens briefly then falls back asleep , unable to provide any hx He has not had any vomiting, no coffee ground emesis, nasal feeding tube in place, passing flatus per nursing, no stool Stool  documented  heme + yesterday    History reviewed. No pertinent past medical history.  Past Surgical History:  Procedure Laterality Date  . CORONARY STENT INTERVENTION N/A 05/22/2018   Procedure: CORONARY STENT INTERVENTION;  Surgeon: Lorretta Harp, MD;  Location: [facility];  Service: Cardiovascular;  Laterality: N/A;  . IR FLUORO GUIDE CV LINE RIGHT  05/31/2018  . IR US GUIDE VASC ACCESS RIGHT  05/31/2018  . LEFT HEART CATH AND CORONARY ANGIOGRAPHY N/A 05/22/2018   Procedure: LEFT HEART CATH AND CORONARY ANGIOGRAPHY;  Surgeon: Lorretta Harp, MD;  Location: [facility];  Service: Cardiovascular;  Laterality: N/A;    Prior to Admission medications   Medication Sig Start Date End Date Taking? Authorizing Provider  nitroGLYCERIN (NITROSTAT) 0.4 MG SL tablet Place 0.4 mg under the tongue every 5 (five) minutes as needed for chest pain.   Yes [provider]    Current Facility-Administered Medications  Medication Dose Route Frequency Provider Last Rate Last Dose  . 0.9 %  sodium chloride infusion (Manually program via Guardrails IV Fluids)   Intravenous Once Brand Males, MD      . 0.9 %  sodium chloride infusion (Manually program via Guardrails IV Fluids)   Intravenous Once Anders Simmonds, MD      . 0.9 %  sodium chloride infusion  250 mL Intravenous PRN Hammonds, Sharyn Blitz, MD 10 mL/hr at 06/05/18 2138 250 mL at 06/05/18 2138  . 0.9 %  sodium chloride infusion  100 mL Intravenous PRN Mauricia Area, MD      .  0.9 %  sodium chloride infusion  100 mL Intravenous PRN Deterding, Jeneen Rinks, MD      . albuterol (PROVENTIL) (2.5 MG/3ML) 0.083% nebulizer solution 2.5 mg  2.5 mg Nebulization Q2H PRN Ollis, Brandi L, NP      . alteplase (CATHFLO ACTIVASE) injection 2 mg  2 mg Intracatheter Once PRN Mauricia Area, MD      . aspirin chewable tablet 81 mg  81 mg Per Tube Daily Lorretta Harp, MD   81 mg at 06/09/18 0906  . atorvastatin (LIPITOR) tablet 20 mg  20 mg Oral q1800  Troy Sine, MD   20 mg at 06/08/18 1733  . carvedilol (COREG) tablet 6.25 mg  6.25 mg Per NG tube BID WC Fransico Him R, MD   6.25 mg at 06/09/18 0641  . chlorhexidine (PERIDEX) 0.12 % solution 15 mL  15 mL Mouth Rinse BID Brand Males, MD   15 mL at 06/09/18 0906  . Chlorhexidine Gluconate Cloth 2 % PADS 6 each  6 each Topical Q0600 Rexene Agent, MD   6 each at 06/08/18 1133  . Chlorhexidine Gluconate Cloth 2 % PADS 6 each  6 each Topical [id number] Mauricia Area, MD   6 each at 06/08/18 2200  . [START ON 06/10/2018] Darbepoetin Alfa (ARANESP) injection 200 mcg  200 mcg Intravenous Q Thu-HD Deterding, Jeneen Rinks, MD      . dexmedetomidine (PRECEDEX) 400 MCG/100ML (4 mcg/mL) infusion  0.4-1.2 mcg/kg/hr Intravenous Titrated Tarry Kos, MD 14.9 mL/hr at 06/09/18 0620 1 mcg/kg/hr at 06/09/18 0620  . docusate (COLACE) 50 MG/5ML liquid 100 mg  100 mg Per Tube BID PRN Hammonds, Sharyn Blitz, MD      . famotidine (PEPCID) IVPB 20 mg premix  20 mg Intravenous Daily Anders Simmonds, MD 100 mL/hr at 06/09/18 0911 20 mg at 06/09/18 0911  . feeding supplement (NEPRO CARB STEADY) liquid 1,000 mL  1,000 mL Per Tube Q24H Brand Males, MD 55 mL/hr at 06/09/18 0016 1,000 mL at 06/09/18 0016  . ferumoxytol (FERAHEME) 510 mg in sodium chloride 0.9 % 100 mL IVPB  510 mg Intravenous Q Alphonsa Gin, MD 468 mL/hr at 06/07/18 0926 510 mg at 06/07/18 0926  . Gerhardt's butt cream   Topical PRN Justin Mend, MD      . heparin injection 1,000 Units  1,000 Units Dialysis PRN Deterding, Jeneen Rinks, MD      . heparin injection 2,500 Units  40 Units/kg Dialysis Once in dialysis Deterding, Jeneen Rinks, MD      . hydrALAZINE (APRESOLINE) injection 10-20 mg  10-20 mg Intravenous Q4H PRN Brand Males, MD   15 mg at 06/07/18 0210  . insulin aspart (novoLOG) injection 0-9 Units  0-9 Units Subcutaneous Q4H Omar Person, NP   1 Units at 06/01/18 1230  . labetalol (NORMODYNE,TRANDATE) injection 20 mg  20  mg Intravenous Q4H PRN Tarry Kos, MD      . LORazepam (ATIVAN) injection 1-2 mg  1-2 mg Intravenous Q1H PRN Frederik Pear, MD   2 mg at 06/05/18 1115  . LORazepam (ATIVAN) tablet 1 mg  1 mg Oral TID Kipp Brood, MD   1 mg at 06/09/18 0907   Followed by  . [START ON 06/10/2018] LORazepam (ATIVAN) tablet 0.5 mg  0.5 mg Oral TID Kipp Brood, MD      . MEDLINE mouth rinse  15 mL Mouth Rinse q12n4p Brand Males, MD   15 mL at 06/07/18 1731  . multivitamin (RENA-VIT)  tablet 1 tablet  1 tablet Oral Nile Riggs, MD   1 tablet at 06/08/18 2243  . nitroGLYCERIN (NITROSTAT) SL tablet 0.4 mg  0.4 mg Sublingual Q5 min PRN Frederik Pear, MD   0.4 mg at 06/06/18 2023  . pyridostigmine (MESTINON) tablet 60 mg  60 mg Oral Q8H Amie Portland, MD      . QUEtiapine (SEROQUEL) tablet 50 mg  50 mg Oral BID Tarry Kos, MD   50 mg at 06/09/18 9518  . sodium chloride flush (NS) 0.9 % injection 3 mL  3 mL Intravenous Q12H Lorretta Harp, MD   3 mL at 06/09/18 0907  . sodium chloride flush (NS) 0.9 % injection 3 mL  3 mL Intravenous PRN Lorretta Harp, MD      . ticagrelor [facility]) tablet 90 mg  90 mg Per Tube BID Lorretta Harp, MD   90 mg at 06/09/18 0907    Allergies as of 05/22/2018  . (No Known Allergies)    History reviewed. No pertinent family history.  Social History   Socioeconomic History  . Marital status: Divorced    Spouse name: Not on file  . Number of children: Not on file  . Years of education: Not on file  . Highest education level: Not on file  Occupational History  . Not on file  Social Needs  . Financial resource strain: Not on file  . Food insecurity:    Worry: Not on file    Inability: Not on file  . Transportation needs:    Medical: Not on file    Non-medical: Not on file  Tobacco Use  . Smoking status: Current Every Day Smoker    Packs/day: 1.00    Types: Cigarettes  . Smokeless tobacco: Current User  Substance and  Sexual Activity  . Alcohol use: Not on file  . Drug use: Not on file  . Sexual activity: Not on file  Lifestyle  . Physical activity:    Days per week: Not on file    Minutes per session: Not on file  . Stress: Not on file  Relationships  . Social connections:    Talks on phone: Not on file    Gets together: Not on file    Attends religious service: Not on file    Active member of club or organization: Not on file    Attends meetings of clubs or organizations: Not on file    Relationship status: Not on file  . Intimate partner violence:    Fear of current or ex partner: Not on file    Emotionally abused: Not on file    Physically abused: Not on file    Forced sexual activity: Not on file  Other Topics Concern  . Not on file  Social History Narrative  . Not on file    ROS;  Pt unable to provide  Physical Exam: Vital signs in last 24 hours: Temp:  [98.1 F (36.7 C)-100 F (37.8 C)] 98.1 F (36.7 C) (08/14 0930) Pulse Rate:  [61-143] 66 (08/14 1000) Resp:  [0-32] 20 (08/14 1000) BP: (130-170)/(36-108) 156/84 (08/14 1000) SpO2:  [96 %-100 %] 100 % (08/14 0930) Weight:  [60.1 kg-60.4 kg] 60.4 kg (08/14 0655) Last BM Date: 06/08/18 General:   Alert,  Well-developed, well-nourished, pleasant and cooperative in NAD Head:  Normocephalic and atraumatic. Eyes:  Sclera clear, no icterus.   Conjunctiva pink. Ears:  Normal auditory acuity. Nose:  No deformity, discharge,  or lesions. Mouth:  No deformity or lesions.   Neck:  Supple; no masses or thyromegaly. Lungs:  Clear throughout to auscultation.   No wheezes, crackles, or rhonchi. Heart:  Regular rate and rhythm; no murmurs, clicks, rubs,  or gallops. Abdomen:  Soft,nontender, BS active,nonpalp mass or hsm.   Rectal:  Deferred  Msk:  Symmetrical without gross deformities. . Pulses:  Normal pulses noted. Extremities:  Without clubbing or edema. Neurologic:  Alert and  oriented x4;  grossly normal neurologically. Skin:   Intact without significant lesions or rashes.. Psych:  Alert and cooperative. Normal mood and affect.  Intake/Output from previous day: 08/13 0701 - 08/14 0700 In: 973.2 [I.V.:313.2; NG/GT:660] Out: -  Intake/Output this shift: Total I/O In: 415 [I.V.:100; Blood:315] Out: -   Lab Results: Recent Labs    06/07/18 0652  06/07/18 2031 06/08/18 0416 06/09/18 0339  WBC 13.6*  --   --  7.5 8.8  HGB 7.5*   < > 8.0* 7.7* 7.0*  HCT 23.1*   < > 24.1* 23.9* 21.8*  PLT 300  --   --  207 224   < > = values in this interval not displayed.   BMET Recent Labs    06/07/18 0652 06/08/18 0416 06/09/18 0339  NA 137 136 136  K 6.2* 4.8 5.1  CL 97* 97* 97*  CO2 19* 27 23  GLUCOSE 121* 103* 113*  BUN 160* 77* 140*  CREATININE 11.30* 6.39* 8.96*  CALCIUM 8.1* 7.9* 8.1*   LFT Recent Labs    06/09/18 0339  PROT 5.4*  ALBUMIN 2.0*  2.0*  AST 35  ALT 13  ALKPHOS 107  BILITOT 0.8  BILIDIR 0.1  IBILI 0.7      MPRESSION:   #1 52 yo WM with hx polysubstance abuse with ETOH and cocaine who is s/p VFib arrest, outside of hospital on 05/22/2018, he has had and STEMI, and underwent catheterization with drug-eluting stent to 100% occluded LAD on the day of admission. Anticoagulated with Brilinta and aspirin Course complicated by acute renal failure and cardiogenic shock. Also felt to have anoxic encephalopathy, and EtOH and cocaine withdrawal. Remains confused and is requiring Precedex for agitation.  #2 persistent renal failure, now on hemodialysis  #3 drop in hemoglobin from 14 on admission to a nadir of 6 on 06/06/2018.  Is requiring periodic transfusions.  No overt GI bleeding, but stool is heme positive and was noted to have blood from the stomach with OG suctioning early on in his course.  Also documented to have lost blood from hemodialysis catheter placement.  Suspect his anemia is multifactoral. So has had somewhat conflicting iron studies, has received Feraheme.  At high risk  for gastritis or peptic ulcer disease, could also have component of ischemic bowel. No prior GI work-up per chart.  #4 myasthenia gravis   Recommendation:   Patient is n.p.o. with failed swallowing eval. He does have Dobbhoff in place Will increase IV Pepcid to twice daily Transfuse to keep hemoglobin 7-8 Will need to discuss endoscopic evaluation with CCM, from GI perspective would prefer to start with a EGD, he may need to be intubated for sedation.  I discussed with CCM - they feel OK for EGD in endo unit with MAC - could wind up needing intubation - have scheduled for EGD tomorrow with Dr Tonny Bollman  06/09/2018, 10:27 AM      Attending physician's note   I have taken a history, examined the patient  and reviewed the chart. I agree with the Advanced Practitioner's note, impression and recommendations. Progressive anemia with heme + stool. Some blood in OG tube early in course. EGD to further evaluate. STEMI with LAD drug eluting stent placed, on Brilinta and ASA. Higher risk for cardiopulmonary complication with sedation, EGD. CCM has cleared him for EGD with MAC. The risks (including bleeding, perforation, infection, missed lesions, medication reactions and possible hospitalization or surgery if complications occur), benefits, and alternatives to endoscopy with possible biopsy and possible dilation were discussed with the patient and family in the room they consent to proceed.    Lucio Edward, MD FACG [phone] office

## 2018-06-09 NOTE — Progress Notes (Addendum)
Neurology Progress Note   S:// Reports improvement with starting Mestinon and is overall strength and speech.   O:// Current vital signs: BP (!) 154/75 (BP Location: Left Arm)   Pulse 72   Temp 98.1 F (36.7 C) (Oral)   Resp (!) 21   Ht 5\' 10"  (1.778 m)   Wt 60.4 kg Comment: bed scale  SpO2 100%   BMI 19.11 kg/m  Vital signs in last 24 hours: Temp:  [98.1 F (36.7 C)-100 F (37.8 C)] 98.1 F (36.7 C) (08/14 0745) Pulse Rate:  [66-94] 72 (08/14 0745) Resp:  [0-32] 21 (08/14 0745) BP: (133-170)/(72-98) 154/75 (08/14 0745) SpO2:  [96 %-100 %] 100 % (08/14 0655) Weight:  [60.1 kg-60.4 kg] 60.4 kg (08/14 0655) Physical Exam  HEENT-  Normocephalic, no lesions, without obvious abnormality.  Normal external eye and conjunctiva.   Extremities- Warm, dry and intact Musculoskeletal-week Skin-warm and dry, no hyperpigmentation, vitiligo, or suspicious lesions Neuro: Mental Status: Alert, oriented, thought content appropriate.  Speech fluent with mild dysarthria but without evidence of aphasia.  Able to follow 3 step commands without difficulty. Cranial Nerves: II:  Visual fields grossly normal,  III,IV, VI:  Bilateral ptosis improved,, extra-ocular motions intact bilaterally pupils equal, round, reactive to light and accommodation but reports of diplopia with sustained upward gaze a, no diplopia with rightward or leftward gaze. V,VII: smile symmetric, facial light touch sensation normal bilaterally VIII: hearing normal bilaterally IX,X: uvula rises midline XI: bilateral shoulder shrug XII: midline tongue extension Motor: Moves all extremities antigravity and exhibits fatiguability Sensory: Pinprick and light touch intact throughout, bilaterally Deep Tendon Reflexes: Lateral upper extremities 2+ with no deep tendon reflexes in the lower extremitiesd   Medications  Current Facility-Administered Medications:  .  0.9 %  sodium chloride infusion (Manually program via Guardrails  IV Fluids), , Intravenous, Once, Ramaswamy, Murali, MD .  0.9 %  sodium chloride infusion (Manually program via Guardrails IV Fluids), , Intravenous, Once, Anders Simmonds, MD .  0.9 %  sodium chloride infusion, 250 mL, Intravenous, PRN, Hammonds, Sharyn Blitz, MD, Last Rate: 10 mL/hr at 06/05/18 2138, 250 mL at 06/05/18 2138 .  albuterol (PROVENTIL) (2.5 MG/3ML) 0.083% nebulizer solution 2.5 mg, 2.5 mg, Nebulization, Q2H PRN, Ollis, Brandi L, NP .  aspirin chewable tablet 81 mg, 81 mg, Per Tube, Daily, Lorretta Harp, MD, 81 mg at 06/08/18 1100 .  atorvastatin (LIPITOR) tablet 20 mg, 20 mg, Oral, q1800, Troy Sine, MD, 20 mg at 06/08/18 1733 .  carvedilol (COREG) tablet 6.25 mg, 6.25 mg, Per NG tube, BID WC, Turner, Traci R, MD, 6.25 mg at 06/09/18 0641 .  chlorhexidine (PERIDEX) 0.12 % solution 15 mL, 15 mL, Mouth Rinse, BID, Ramaswamy, Murali, MD, 15 mL at 06/08/18 2244 .  Chlorhexidine Gluconate Cloth 2 % PADS 6 each, 6 each, Topical, Q0600, Rexene Agent, MD, 6 each at 06/08/18 1133 .  Chlorhexidine Gluconate Cloth 2 % PADS 6 each, 6 each, Topical, Q0600, Mauricia Area, MD, 6 each at 06/08/18 2200 .  [START ON 06/10/2018] Darbepoetin Alfa (ARANESP) injection 200 mcg, 200 mcg, Intravenous, Q Thu-HD, Deterding, James, MD .  dexmedetomidine (PRECEDEX) 400 MCG/100ML (4 mcg/mL) infusion, 0.4-1.2 mcg/kg/hr, Intravenous, Titrated, Tarry Kos, MD, Last Rate: 14.9 mL/hr at 06/09/18 0620, 1 mcg/kg/hr at 06/09/18 0620 .  docusate (COLACE) 50 MG/5ML liquid 100 mg, 100 mg, Per Tube, BID PRN, Hammonds, Sharyn Blitz, MD .  famotidine (PEPCID) IVPB 20 mg premix, 20 mg, Intravenous, Daily, Boone Master  E, MD .  feeding supplement (NEPRO CARB STEADY) liquid 1,000 mL, 1,000 mL, Per Tube, Q24H, Ramaswamy, Murali, MD, Last Rate: 55 mL/hr at 06/09/18 0016, 1,000 mL at 06/09/18 0016 .  ferumoxytol (FERAHEME) 510 mg in sodium chloride 0.9 % 100 mL IVPB, 510 mg, Intravenous, Harrell Lark,  MD, Last Rate: 468 mL/hr at 06/07/18 0926, 510 mg at 06/07/18 0926 .  Gerhardt's butt cream, , Topical, PRN, Justin Mend, MD .  hydrALAZINE (APRESOLINE) injection 10-20 mg, 10-20 mg, Intravenous, Q4H PRN, Brand Males, MD, 15 mg at 06/07/18 0210 .  insulin aspart (novoLOG) injection 0-9 Units, 0-9 Units, Subcutaneous, Q4H, Eubanks, Katalina M, NP, 1 Units at 06/01/18 1230 .  labetalol (NORMODYNE,TRANDATE) injection 20 mg, 20 mg, Intravenous, Q4H PRN, Tarry Kos, MD .  LORazepam (ATIVAN) injection 1-2 mg, 1-2 mg, Intravenous, Q1H PRN, Frederik Pear, MD, 2 mg at 06/05/18 1115 .  [EXPIRED] LORazepam (ATIVAN) tablet 2 mg, 2 mg, Oral, TID, 2 mg at 06/05/18 1800 **FOLLOWED BY** LORazepam (ATIVAN) tablet 1 mg, 1 mg, Oral, TID, 1 mg at 06/08/18 2243 **FOLLOWED BY** [START ON 06/10/2018] LORazepam (ATIVAN) tablet 0.5 mg, 0.5 mg, Oral, TID, Agarwala, Ravi, MD .  MEDLINE mouth rinse, 15 mL, Mouth Rinse, q12n4p, Ramaswamy, Murali, MD, 15 mL at 06/07/18 1731 .  multivitamin (RENA-VIT) tablet 1 tablet, 1 tablet, Oral, QHS, Deterding, Jeneen Rinks, MD, 1 tablet at 06/08/18 2243 .  nitroGLYCERIN (NITROSTAT) SL tablet 0.4 mg, 0.4 mg, Sublingual, Q5 min PRN, Ogan, Okoronkwo U, MD, 0.4 mg at 06/06/18 2023 .  pyridostigmine (MESTINON) tablet 30 mg, 30 mg, Oral, Q8H, Amie Portland, MD, 30 mg at 06/09/18 0641 .  QUEtiapine (SEROQUEL) tablet 50 mg, 50 mg, Oral, BID, Tarry Kos, MD, 50 mg at 06/08/18 2244 .  sodium chloride flush (NS) 0.9 % injection 3 mL, 3 mL, Intravenous, Q12H, Lorretta Harp, MD, 3 mL at 06/08/18 2244 .  sodium chloride flush (NS) 0.9 % injection 3 mL, 3 mL, Intravenous, PRN, Lorretta Harp, MD .  ticagrelor (BRILINTA) tablet 90 mg, 90 mg, Per Tube, BID, Lorretta Harp, MD, 90 mg at 06/08/18 2244 Labs CBC    Component Value Date/Time   WBC 8.8 06/09/2018 0339   RBC 2.48 (L) 06/09/2018 0339   HGB 7.0 (L) 06/09/2018 0339   HCT 21.8 (L) 06/09/2018 0339   HCT 22.5  (L) 06/05/2018 1652   PLT 224 06/09/2018 0339   MCV 87.9 06/09/2018 0339   MCH 28.2 06/09/2018 0339   MCHC 32.1 06/09/2018 0339   RDW 19.8 (H) 06/09/2018 0339   LYMPHSABS 0.7 06/09/2018 0339   MONOABS 0.9 06/09/2018 0339   EOSABS 0.1 06/09/2018 0339   BASOSABS 0.1 06/09/2018 0339    CMP     Component Value Date/Time   NA 136 06/09/2018 0339   K 5.1 06/09/2018 0339   CL 97 (L) 06/09/2018 0339   CO2 23 06/09/2018 0339   GLUCOSE 113 (H) 06/09/2018 0339   BUN 140 (H) 06/09/2018 0339   CREATININE 8.96 (H) 06/09/2018 0339   CALCIUM 8.1 (L) 06/09/2018 0339   PROT 5.4 (L) 06/09/2018 0339   ALBUMIN 2.0 (L) 06/09/2018 0339   ALBUMIN 2.0 (L) 06/09/2018 0339   AST 35 06/09/2018 0339   ALT 13 06/09/2018 0339   ALKPHOS 107 06/09/2018 0339   BILITOT 0.8 06/09/2018 0339   GFRNONAA 6 (L) 06/09/2018 0339   GFRAA 7 (L) 06/09/2018 0339    Imaging I have reviewed images in  epic and the results pertinent to this consultation are: MRI brain negative for stroke or acute process.   Assessment:  52 year old man with weakness and dysphagia, past history of lupus, does lipidemia tobacco abuse alcohol abuse cocaine abuse and myasthenia gravis status post cardiac arrest, noted to have delirium and upon resolution of delirium continued to have some dysarthria. Initial concern for stroke.  MRI negative for stroke. Likely related to his myasthenia gravis.  He is not a myasthenic crisis.  He was not on his Mestinon which has been resumed yesterday.  Impression: Toxic metabolic encephalopathy in the setting of multiple metabolic derangements Status post cardiac arrest History of substance abuse/Etoh abuse Myasthenia gravis  Recommendations: Increase Mestinon to 60 3 times daily Swallow evaluation Check NIF and FVC's q8h Patient to follow-up with his outpatient neurologist upon discharge Neurology service will be available as needed.  Please call with questions  -- Amie Portland, MD [facility]  Neuro[facility]ist Pager: [phone] If 7pm to 7am, please call on call as listed on AMION.

## 2018-06-10 ENCOUNTER — Encounter (HOSPITAL_COMMUNITY)
Admission: EM | Disposition: A | Discharge status: Home Health | Payer: Self-pay | Source: Home / Self Care | Attending: Cardiovascular Disease

## 2018-06-10 ENCOUNTER — Inpatient Hospital Stay (HOSPITAL_COMMUNITY): Payer: No Typology Code available for payment source | Admitting: Anesthesiology

## 2018-06-10 ENCOUNTER — Encounter (HOSPITAL_COMMUNITY): Payer: Self-pay | Admitting: Gastroenterology

## 2018-06-10 DIAGNOSIS — D500 Iron deficiency anemia secondary to blood loss (chronic): Secondary | ICD-10-CM

## 2018-06-10 DIAGNOSIS — K2980 Duodenitis without bleeding: Secondary | ICD-10-CM

## 2018-06-10 DIAGNOSIS — K228 Other specified diseases of esophagus: Secondary | ICD-10-CM

## 2018-06-10 DIAGNOSIS — R195 Other fecal abnormalities: Secondary | ICD-10-CM

## 2018-06-10 DIAGNOSIS — K449 Diaphragmatic hernia without obstruction or gangrene: Secondary | ICD-10-CM

## 2018-06-10 HISTORY — PX: ESOPHAGOGASTRODUODENOSCOPY (EGD) WITH PROPOFOL: SHX5813

## 2018-06-10 LAB — GLUCOSE, CAPILLARY
GLUCOSE-CAPILLARY: 84 mg/dL (ref 70–99)
Glucose-Capillary: 100 mg/dL — ABNORMAL HIGH (ref 70–99)
Glucose-Capillary: 86 mg/dL (ref 70–99)
Glucose-Capillary: 97 mg/dL (ref 70–99)
Glucose-Capillary: 96 mg/dL (ref 70–99)
Glucose-Capillary: 99 mg/dL (ref 70–99)
Glucose-Capillary: 87 mg/dL (ref 70–99)

## 2018-06-10 LAB — BPAM RBC
BLOOD PRODUCT EXPIRATION DATE: 201909032359
Blood Product Expiration Date: 201909032359
Blood Product Expiration Date: 201909082359
Blood Product Expiration Date: 201909092359
ISSUE DATE / TIME: 201908121516
ISSUE DATE / TIME: 201908140859
ISSUE DATE / TIME: 201908111153
ISSUE DATE / TIME: 201908121131
UNIT TYPE AND RH: 5100
UNIT TYPE AND RH: 5100
Unit Type and Rh: 5100
Unit Type and Rh: 5100

## 2018-06-10 LAB — HEPATIC FUNCTION PANEL
ALK PHOS: 101 U/L (ref 38–126)
ALT: 16 U/L (ref 0–44)
AST: 36 U/L (ref 15–41)
Albumin: 2 g/dL — ABNORMAL LOW (ref 3.5–5.0)
BILIRUBIN INDIRECT: 0.5 mg/dL (ref 0.3–0.9)
Bilirubin, Direct: 0.2 mg/dL (ref 0.0–0.2)
TOTAL PROTEIN: 5.1 g/dL — AB (ref 6.5–8.1)
Total Bilirubin: 0.7 mg/dL (ref 0.3–1.2)

## 2018-06-10 LAB — CBC WITH DIFFERENTIAL/PLATELET
Abs Immature Granulocytes: 0.1 10*3/uL (ref 0.0–0.1)
BASOS ABS: 0 10*3/uL (ref 0.0–0.1)
Basophils Relative: 1 %
EOS PCT: 2 %
Eosinophils Absolute: 0.1 10*3/uL (ref 0.0–0.7)
HEMATOCRIT: 21.7 % — AB (ref 39.0–52.0)
HEMOGLOBIN: 7.1 g/dL — AB (ref 13.0–17.0)
Immature Granulocytes: 1 %
LYMPHS ABS: 0.9 10*3/uL (ref 0.7–4.0)
LYMPHS PCT: 14 %
MCH: 29.5 pg (ref 26.0–34.0)
MCHC: 32.7 g/dL (ref 30.0–36.0)
MCV: 90 fL (ref 78.0–100.0)
MONO ABS: 0.8 10*3/uL (ref 0.1–1.0)
MONOS PCT: 13 %
Neutro Abs: 4.7 10*3/uL (ref 1.7–7.7)
Neutrophils Relative %: 71 %
Platelets: 120 10*3/uL — ABNORMAL LOW (ref 150–400)
RBC: 2.41 MIL/uL — ABNORMAL LOW (ref 4.22–5.81)
RDW: 18.7 % — ABNORMAL HIGH (ref 11.5–15.5)
WBC: 6.6 10*3/uL (ref 4.0–10.5)

## 2018-06-10 LAB — TYPE AND SCREEN
ABO/RH(D): O POS
ANTIBODY SCREEN: NEGATIVE
UNIT DIVISION: 0
Unit division: 0
Unit division: 0
Unit division: 0

## 2018-06-10 LAB — BASIC METABOLIC PANEL
Anion gap: 12 (ref 5–15)
BUN: 84 mg/dL — AB (ref 6–20)
CO2: 27 mmol/L (ref 22–32)
CREATININE: 5.88 mg/dL — AB (ref 0.61–1.24)
Calcium: 7.7 mg/dL — ABNORMAL LOW (ref 8.9–10.3)
Chloride: 97 mmol/L — ABNORMAL LOW (ref 98–111)
GFR calc Af Amer: 12 mL/min — ABNORMAL LOW (ref >60)
GFR calc non Af Amer: 10 mL/min — ABNORMAL LOW (ref >60)
GLUCOSE: 100 mg/dL — AB (ref 70–99)
POTASSIUM: 4.6 mmol/L (ref 3.5–5.1)
Sodium: 136 mmol/L (ref 135–145)

## 2018-06-10 LAB — MAGNESIUM: Magnesium: 2.4 mg/dL (ref 1.7–2.4)

## 2018-06-10 LAB — TROPONIN I: TROPONIN I: 0.06 ng/mL — AB (ref <0.03)

## 2018-06-10 SURGERY — ESOPHAGOGASTRODUODENOSCOPY (EGD) WITH PROPOFOL
Anesthesia: Monitor Anesthesia Care

## 2018-06-10 MED ORDER — SEVELAMER CARBONATE 2.4 G PO PACK
2.4000 g | PACK | Freq: Three times a day (TID) | ORAL | Status: DC
  Administered 2018-06-10 – 2018-06-12 (×5): 2.4 g
  Filled 2018-06-10 (×9): qty 1

## 2018-06-10 MED ORDER — LACTATED RINGERS IV SOLN: INTRAVENOUS | Status: DC | PRN

## 2018-06-10 MED ORDER — GI COCKTAIL ~~LOC~~
30.0000 mL | Freq: Once | ORAL | Status: AC
  Administered 2018-06-10: 30 mL via ORAL
  Filled 2018-06-10 (×2): qty 30

## 2018-06-10 MED ORDER — PROPOFOL 10 MG/ML IV BOLUS
INTRAVENOUS | Status: DC | PRN
  Administered 2018-06-10: 10 mg via INTRAVENOUS
  Administered 2018-06-10: 20 mg via INTRAVENOUS

## 2018-06-10 MED ORDER — CHLORHEXIDINE GLUCONATE CLOTH 2 % EX PADS: 6.0000 | MEDICATED_PAD | Freq: Every day | CUTANEOUS | Status: DC

## 2018-06-10 MED ORDER — DARBEPOETIN ALFA 200 MCG/0.4ML IJ SOSY
200.0000 ug | PREFILLED_SYRINGE | INTRAMUSCULAR | Status: DC
  Administered 2018-06-11: 200 ug via INTRAVENOUS
  Filled 2018-06-10 (×2): qty 0.4

## 2018-06-10 MED ORDER — CARVEDILOL 12.5 MG PO TABS
12.5000 mg | ORAL_TABLET | Freq: Two times a day (BID) | ORAL | Status: DC
  Administered 2018-06-10: 12.5 mg via NASOGASTRIC
  Filled 2018-06-10 (×2): qty 1

## 2018-06-10 MED ORDER — PROPOFOL 500 MG/50ML IV EMUL
INTRAVENOUS | Status: DC | PRN
  Administered 2018-06-10: 50 ug/kg/min via INTRAVENOUS

## 2018-06-10 MED ORDER — PANTOPRAZOLE SODIUM 40 MG PO TBEC
40.0000 mg | DELAYED_RELEASE_TABLET | Freq: Every day | ORAL | Status: DC
  Administered 2018-06-10: 40 mg via ORAL
  Filled 2018-06-10: qty 1

## 2018-06-10 SURGICAL SUPPLY — 15 items

## 2018-06-10 NOTE — Transfer of Care (Signed)
Immediate Anesthesia Transfer of Care Note  Patient: Howard Thompson  Procedure(s) Performed: ESOPHAGOGASTRODUODENOSCOPY (EGD) WITH PROPOFOL (N/A )  Patient Location: PACU  Anesthesia Type:MAC  Level of Consciousness: awake and alert   Airway & Oxygen Therapy: Patient Spontanous Breathing and Patient connected to nasal cannula oxygen  Post-op Assessment: Report given to RN and Post -op Vital signs reviewed and stable  Post vital signs: Reviewed and stable  Last Vitals:  Vitals Value Taken Time  BP    Temp    Pulse    Resp    SpO2      Last Pain:  Vitals:   06/10/18 0910  TempSrc: Oral  PainSc: 0-No pain      Patients Stated Pain Goal: 0 (32/95/18 8416)  Complications: No apparent anesthesia complications

## 2018-06-10 NOTE — Progress Notes (Signed)
Inpatient Rehabilitation  Met with patient, separated spouse Jacqlyn Larsen, brother, and sisters at bedside to discuss IP Rehab program candidacy, average length of stay, and anticipated need for 24/7 assist.  Patient appeared to more alert, awake, and appropriate today throughout discussion.  Family asked appropriate questions and expressed that they were all processing a lot of information.  They plan to discuss it and follow up with me.  Plan to update the team when I know.  Call if questions.    Carmelia Roller., CCC/SLP Admission Coordinator  [place]  Cell [phone]

## 2018-06-10 NOTE — Anesthesia Preprocedure Evaluation (Addendum)
Anesthesia Evaluation  Patient identified by MRN, date of birth, ID band Patient awake    Reviewed: Allergy & Precautions, NPO status , Patient's Chart, lab work & pertinent test results  History of Anesthesia Complications Negative for: history of anesthetic complications  Airway Mallampati: II  TM Distance: >3 FB Neck ROM: Full    Dental  (+) Dental Advisory Given   Pulmonary Current Smoker,    breath sounds clear to auscultation       Cardiovascular + Past MI and + Cardiac Stents   Rhythm:Regular Rate:Normal   '19 TTE - EF 40% to 45% (improved since cath). Mild AI. Mild MR. There was increased thickness of the septum, consistent with lipomatous hypertrophy.  '19 Cath - Ost LAD to Prox LAD lesion is 100% stenosed. A drug-eluting stent was successfully placed using a STENT SYNERGY DES 3X20. Post intervention, there is a 0% residual stenosis. LV end diastolic pressure is mildly elevated. The left ventricular ejection fraction is less than 25% by visual estimate    Neuro/Psych negative neurological ROS  negative psych ROS   GI/Hepatic negative GI ROS, (+)     substance abuse  cocaine use,   Endo/Other  negative endocrine ROS  Renal/GU CRFRenal disease  negative genitourinary   Musculoskeletal negative musculoskeletal ROS (+)   Abdominal   Peds  Hematology  (+) anemia ,  Thrombocytopenia    Anesthesia Other Findings   Reproductive/Obstetrics                            Anesthesia Physical Anesthesia Plan  ASA: III  Anesthesia Plan: MAC   Post-op Pain Management:    Induction: Intravenous  PONV Risk Score and Plan: 1 and Propofol infusion and Treatment may vary due to age or medical condition  Airway Management Planned: Nasal Cannula and Natural Airway  Additional Equipment: None  Intra-op Plan:   Post-operative Plan:   Informed Consent: I have reviewed the  patients History and Physical, chart, labs and discussed the procedure including the risks, benefits and alternatives for the proposed anesthesia with the patient or authorized representative who has indicated his/her understanding and acceptance.   Dental advisory given  Plan Discussed with: CRNA and Anesthesiologist  Anesthesia Plan Comments:        Anesthesia Quick Evaluation

## 2018-06-10 NOTE — Progress Notes (Signed)
PULMONARY / CRITICAL CARE MEDICINE   Name: Howard Thompson MRN: [id number] DOB: 1966-05-01    ADMISSION DATE:  05/22/2018 CONSULTATION DATE: 06/05/2018 LOS 19 days  REFERRING MD: [facility]  CHIEF COMPLAINT: Post cardiac arrest.  Acute renal failure.  Delirium.  Polysubstance abuse. BRIEF  52 year old with history of tobacco abuse substance abuse alcohol abuse cocaine abuse who had out of hospital arrest 05/22/2018.  He has undergone cardiac cath with LAD stent.  He had blood loss anemia secondary to HD catheter tunnel placed his require a transfusion.  His main problem is agitation and delirium and is transferred to the intensive care unit on 06/05/2018 for use of Precedex.  There is some concern of questionable retroperitoneal bleed although his abdomen is soft and nontender.  Transfused 06/06/2018 and consideration for CT of the abdomen is being entertained.    STUDIES:    CULTURES: 06/04/2018 blood cultures obtained  ANTIBIOTICS:   SIGNIFICANT EVENTS: 7/27 cardiac cath  LINES/TUBES: Right tunneled HD catheter>>    EVENTS 06/06/18 - 89 year old who continues to have anemia with no apparent blood loss.  Delirium and agitation and combative behavior continue p.o. Issue   8/12 - on precedex. Still needing it due to agitated delirium. Not on vent on Room air. On precedex 1.2. Neuro wanting MRI - but agiation interfering. Anuria continues - on iHD - MWF. Currently on HD. S/p PRBC yesterday. No active bleeding     8/13 - per neuro -> myasthenic weakness by end of day cotnributing to fatigue and dysarthria and secondary agitation. Mestinon. Now sitting in chair. But remains on precedex and oriented.  No HD today   8/14 - per neuro ptossis and dysarthria better after mestinon but could be morning effect. Neuro planning to increase mestinon. Follows with neuro at [facility] as opd. Does not think patient is in MG crisis. Currently on iHD. Got agitated overnight - went up on prcedex. Worse at  night. Still overall delirium mightt be improiving  Stool OB positive andhg dropped to 7 -> 1 unit prbc ordered and Pepcid changed to IV    SUBJECTIVE/OVERNIGHT/INTERVAL HX 06/10/2018 -> remains on precdex. Went to toilet in trash can last night. STool reported as "black" last night per RN. Had some abd pain last night and given fentanyl -> due to for endoscopy today in procedure suite with anesthesia support per GI. Still anuric. No HD today. Speecch only allows for ice chips due to dysphagia. Echo yesterday ef 45%  This am seems oriented - on precedex 0.8  VITAL SIGNS: BP 130/78   Pulse 64   Temp 98.8 F (37.1 C) (Oral)   Resp 14   Ht 5\' 10"  (1.778 m)   Wt 57 kg   SpO2 100%   BMI 18.03 kg/m   HEMODYNAMICS:    VENTILATOR SETTINGS:    INTAKE / OUTPUT: I/O last 3 completed shifts: In: 1907.7 [I.V.:662.7; Blood:315; NG/GT:880; IV Piggyback:50] Out: 1779 [Other:3815]  PHYSICAL EXAMINATION:    General Appearance:    Looks chronic well. In icu bed  Head:    Normocephalic, without obvious abnormality, atraumatic  Eyes:    PERRL - uyes, conjunctiva/corneas - clear      Ears:    Normal external ear canals, both ears  Nose:   NG tube - yes  Throat:  ETT TUBE - no , OG tube - no  Neck:   Supple,  No enlargement/tenderness/nodules     Lungs:     Clear to auscultation bilaterally,   Chest  wall:    No deformity  Heart:    S1 and S2 normal, no murmur, CVP - no.  Pressors - no  Abdomen:     Soft, no masses, no organomegaly  Genitalia:    Not done  Rectal:   not done  Extremities:   Extremities- intact. Just difufsed weakness     Skin:   Intact in exposed areas . Sacral area - no decub reported by RN     Neurologic:   Sedation - precedex 0.8 -> RASS - +1 . Moves all 4s - yes. CAM-ICU - neg for delirium . Orientation - x3+. Slurred speech due to MG and deconditioned +             PULMONARY Recent Labs  Lab 06/04/18 0924  PHART 7.515*  PCO2ART 33.7  PO2ART 71.6*   HCO3 27.0  O2SAT 95.1    CBC Recent Labs  Lab 06/08/18 0416 06/09/18 0339 06/10/18 0230  HGB 7.7* 7.0* 7.1*  HCT 23.9* 21.8* 21.7*  WBC 7.5 8.8 6.6  PLT 207 224 120*    COAGULATION Recent Labs  Lab 06/04/18 1044  INR 1.23    CARDIAC  No results for input(s): TROPONINI in the last 168 hours. No results for input(s): PROBNP in the last 168 hours.   CHEMISTRY Recent Labs  Lab 06/06/18 0425 06/07/18 0334 06/07/18 0652 06/08/18 0416 06/09/18 0339 06/10/18 0230  NA 138 136 137 136 136 136  K 5.3* 5.8* 6.2* 4.8 5.1 4.6  CL 98 99 97* 97* 97* 97*  CO2 24 20* 19* 27 23 27   GLUCOSE 107* 119* 121* 103* 113* 100*  BUN 99* 156* 160* 77* 140* 84*  CREATININE 8.89* 11.04* 11.30* 6.39* 8.96* 5.88*  CALCIUM 8.0* 8.0* 8.1* 7.9* 8.1* 7.7*  MG 2.9* 3.3*  --  2.5* 2.9* 2.4  PHOS 9.1* 9.6* 10.3* 6.0* 7.1*  --    Estimated Creatinine Clearance: 12 mL/min (A) (by C-G formula based on SCr of 5.88 mg/dL (H)).   LIVER Recent Labs  Lab 06/04/18 1044  06/06/18 0425 06/07/18 0334 06/07/18 5537 06/08/18 0416 06/09/18 0339 06/10/18 0230  AST  --    < > 27 39  --  35 35 36  ALT  --    < > 8 12  --  12 13 16   ALKPHOS  --    < > 112 144*  --  121 107 101  BILITOT  --    < > 0.8 1.0  --  0.9 0.8 0.7  PROT  --    < > 5.3* 5.7*  --  5.1* 5.4* 5.1*  ALBUMIN  --    < > 2.1*  2.1* 2.3* 2.3* 1.9*  1.9* 2.0*  2.0* 2.0*  INR 1.23  --   --   --   --   --   --   --    < > = values in this interval not displayed.     INFECTIOUS Recent Labs  Lab 06/04/18 1047 06/04/18 1228 06/05/18 0238 06/06/18 0425  LATICACIDVEN 1.0  --   --   --   PROCALCITON  --  3.00 4.85 5.34     ENDOCRINE CBG (last 3)  Recent Labs    06/09/18 2334 06/10/18 0334 06/10/18 0708  GLUCAP 92 100* 86         IMAGING x48h  - image(s) personally visualized  -   highlighted in bold No results found. DISCUSSION: 52 year old with history of tobacco abuse  substance abuse alcohol abuse cocaine abuse  who had out of hospital arrest 05/22/2018.  He has undergone cardiac cath with LAD stent.  He had blood loss anemia secondary to HD catheter tunnel placed his require a transfusion.  His main problem is agitation and delirium and is transferred to the intensive care unit on 06/05/2018 for use of Precedex.  There is some concern of questionable retroperitoneal bleed although his abdomen is soft and nontender.  Transfused 06/06/2018 and consideration for CT of the abdomen is being entertained.  ASSESSMENT / PLAN:  PULMONARY A: History of tobacco abuse Most likely underlying COPD   - maintains resp status 06/10/2018 .   P:   Monitor  CARDIOVASCULAR A:  Out-of-hospital cardiac arrest Ventricular fibrillation Acute coronary syndrome Status post cardiac catheterization 05/22/2018 with stent LAD - On ASA and Brilinta for 12 months    - maintains bp/hr. SBP 144 with MAP 95. On coreg. ECHO 8/14 - ef 45%  P:  Per cards - continue DAPT  Increase beta blockade - pharmacy to check with cards about change coreg to lopressor in setting of emphysema Pharmacy to ask cards about Entresto/ACe inhiibitor (note AKI on HD)   RENAL Lab Results  Component Value Date   CREATININE 5.88 (H) 06/10/2018   CREATININE 8.96 (H) 06/09/2018   CREATININE 6.39 (H) 06/08/2018   Recent Labs  Lab 06/08/18 0416 06/09/18 0339 06/10/18 0230  K 4.8 5.1 4.6    A:   Acute renal failure with anuria. On 1HD MWF. Last HD 06/08/18 and ongoing 06/10/18   - still anuric 06/10/2018   P:   Per RENAL  GASTROINTESTINAL A:   Malnourished  8/15 - concern for slow GI Bleed . Stool last night reported as "black"  P:   Tube feeds on old for GI scope Pepcid IV bid Await endoscopy 06/10/2018    HEMATOLOGIC Recent Labs    06/09/18 0339 06/10/18 0230  HGB 7.0* 7.1*    A:   Anemia most likely multifactorial with recent bleeding episode from placement of tunneled HD catheter right IJ and DAPT. No RP bleed on CT  06/06/18   - s/p 1 unit PRBC 05/25/18, 06/02/18, 06/03/18 , 06/06/18, 06/07/18 x2 units and 06/09/18 - 1unit planned  - Stool OB +    p:  - await GI scope -change goal hgb >/=  7gm% because LOS is 19 days (and is > 15 days since MI)  INFECTIOUS A:   Blood cultures x2 ordered on 06/05/2018  06/10/2018 - not on abx  P:   monitor   ENDOCRINE CBG (last 3)  Recent Labs    06/09/18 2334 06/10/18 0334 06/10/18 0708  GLUCAP 92 100* 86    A:   No acute issues P:   Trend CBGs  NEUROLOGIC A: History of polysubstance abuse   History of myasthenia gravis   Agitation post cardiac arrest. Transfer back  to intensive care unit for Precedex on 06/05/2018   06/10/2018 -   CAM-ICU neg for delirium while on precedex. Slurred speech of MG +   P:   Precedex gtt - wean Start mestinon 06/08/18, increase mestinon dose 06/09/18 per neuro Can dol haldol prn if QTc allows RASS goal: 1    MSK  A: deconditioned  P PT/OT consult   CODE     Code Status Orders  (From admission, onward)         Start     Ordered   05/23/18 2136  Limited resuscitation (code)  Continuous    Question Answer Comment  In the event of cardiac or respiratory ARREST: Initiate Code Blue, Call Rapid Response Yes   In the event of cardiac or respiratory ARREST: Perform CPR No   In the event of cardiac or respiratory ARREST: Perform Intubation/Mechanical Ventilation Yes   In the event of cardiac or respiratory ARREST: Use NIPPV/BiPAp only if indicated Yes   In the event of cardiac or respiratory ARREST: Administer ACLS medications if indicated Yes   In the event of cardiac or respiratory ARREST: Perform Defibrillation or Cardioversion if indicated Yes      05/23/18 2135        Code Status History    Date Active Date Inactive Code Status Order ID Comments User Context   05/22/2018 1957 05/23/2018 2135 Full Code [id number]  Omar Person, NP Inpatient   05/22/2018 1956 05/22/2018 1957 Full Code [id number]   Reyne Dumas, MD Inpatient   05/22/2018 1940 05/22/2018 1956 Full Code [id number]  Lorretta Harp, MD Inpatient   05/22/2018 1940 05/22/2018 1940 Full Code [id number]  Cristina Gong, MD Inpatient        FAMILY  - Updates: 06/06/2018 Sister updated at bedside.  None at bedside 06/10/2018 (RN updated family 06/08/18 late pm when they came).   DISPO =- ccm primary. ICU status.      The patient is critically ill with multiple organ systems failure and requires high complexity decision making for assessment and support, frequent evaluation and titration of therapies, application of advanced monitoring technologies and extensive interpretation of multiple databases.   Critical Care Time devoted to patient care services described in this note is  30  Minutes. This time reflects time of care of this signee Dr Brand Males. This critical care time does not reflect procedure time, or teaching time or supervisory time of PA/NP/Med student/Med Resident etc but could involve care discussion time    Dr. Brand Males, M.D., [facility].C.P Pulmonary and Critical Care Medicine Staff Physician [facility] Pager: [phone], If no answer or between  15:00h - 7:00h: call [phone]  06/10/2018 8:23 AM

## 2018-06-10 NOTE — Op Note (Addendum)
[facility] Patient Name: Howard Thompson Procedure Date : 06/10/2018 MRN: [id number] Attending MD: Ladene Artist , MD Date of Birth: 13-Dec-1965 CSN: [id number] Age: 52 Admit Type: Inpatient Procedure:                Upper GI endoscopy Indications:              Anemia secondary to chronic blood loss, Occult                            blood in stool, Suspected upper gastrointestinal                            bleeding Providers:                Pricilla Riffle. Fuller Plan, MD, Cleda Daub, RN, Charolette Child, Technician, Rejeana Brock, CRNA Referring MD:             CCM Medicines:                Monitored Anesthesia Care Complications:            No immediate complications. Estimated Blood Loss:     Estimated blood loss: none. Procedure:                Pre-Anesthesia Assessment:                           - Prior to the procedure, a History and Physical                            was performed, and patient medications and                            allergies were reviewed. The patient's tolerance of                            previous anesthesia was also reviewed. The risks                            and benefits of the procedure and the sedation                            options and risks were discussed with the patient.                            All questions were answered, and informed consent                            was obtained. Prior Anticoagulants: The patient has                            taken antiplatelet medication Brilinta, last dose  was day of procedure. ASA Grade Assessment: III - A                            patient with severe systemic disease. After                            reviewing the risks and benefits, the patient was                            deemed in satisfactory condition to undergo the                            procedure.                           After obtaining informed consent, the  endoscope was                            passed under direct vision. Throughout the                            procedure, the patient's blood pressure, pulse, and                            oxygen saturations were monitored continuously. The                            GIF-H190 ([id number]) Olympus adult EGD was introduced                            through the mouth, and advanced to the second part                            of duodenum. The upper GI endoscopy was                            accomplished without difficulty. The patient                            tolerated the procedure well. Scope In: Scope Out: Findings:      The Z-line was variable and was found 38 cm from the incisors. Biopsies       not obtained with patient on Brilinta.      The exam of the esophagus was otherwise normal.      A small hiatal hernia was present.      The exam of the stomach was otherwise normal.      Patchy mild inflammation characterized by friability and granularity was       found in the duodenal bulb and in the second portion of the duodenum.      - Feeding tube in place with distal tip in second portion of the       duodenum.      The exam of the duodenum was otherwise normal. Impression:               -  Z-line variable, 38 cm from the incisors.                           - Small hiatal hernia.                           - Duodenitis.                           - Feeding tube.                           - No specimens collected. Recommendation:           - Return patient to ICU for ongoing care.                           - Resume previous diet.                           - Continue present medications.                           - H2RA IV or per feeding tube bid. Other option is                            PPI IV or per feeding tube daily.                           - No additional GI evaluation at this time.                            Consider colonoscopy in a few months to allow                             recovery from STEMI and when a temporarily hold of                            antiplatelet medications would be feasible. Procedure Code(s):        --- Professional ---                           [phone], Esophagogastroduodenoscopy, flexible,                            transoral; diagnostic, including collection of                            specimen(s) by brushing or washing, when performed                            (separate procedure) Diagnosis Code(s):        --- Professional ---                           K22.8, Other specified diseases of esophagus  K44.9, Diaphragmatic hernia without obstruction or                            gangrene                           K29.80, Duodenitis without bleeding                           D50.0, Iron deficiency anemia secondary to blood                            loss (chronic)                           R19.5, Other fecal abnormalities CPT copyright 2017 American Medical Association. All rights reserved. The codes documented in this report are preliminary and upon coder review may  be revised to meet current compliance requirements. Ladene Artist, MD 06/10/2018 10:44:39 AM This report has been signed electronically. Number of Addenda: 0

## 2018-06-10 NOTE — Progress Notes (Signed)
OT Cancellation    06/10/18 0900  OT Visit Information  Last OT Received On 06/10/18  Reason Eval/Treat Not Completed Patient at procedure or test/ unavailable  will attempt later as schedule allows. Maurie Boettcher, OT/L  OT Clinical Specialist [phone]

## 2018-06-10 NOTE — Interval H&P Note (Signed)
History and Physical Interval Note:  06/10/2018 9:55 AM  Howard Thompson  has presented today for surgery, with the diagnosis of anemia, GI bleeding  The various methods of treatment have been discussed with the patient and family. After consideration of risks, benefits and other options for treatment, the patient has consented to  Procedure(s): ESOPHAGOGASTRODUODENOSCOPY (EGD) WITH PROPOFOL (N/A) as a surgical intervention .  The patient's history has been reviewed, patient examined, no change in status, stable for surgery.  I have reviewed the patient's chart and labs.  Questions were answered to the patient's satisfaction.     Pricilla Riffle. Fuller Plan

## 2018-06-10 NOTE — Progress Notes (Addendum)
  Speech Language Pathology Treatment: Dysphagia  Patient Details Name: Howard Thompson MRN: [id number] DOB: 1966-02-20 Today's Date: 06/10/2018 Time: 1420-1450 SLP Time Calculation (min) (ACUTE ONLY): 30 min  Assessment / Plan / Recommendation Clinical Impression  Pt seen at bedside for assessment of po readiness. Brother and sister present at this time. Pt was noted to be awake and alert, asking for food/drink. Pt speech is difficult to understand, due to [place] accent, rapid rate of speech, and oral motor weakness related to myasthenia gravis. Pt was given trials of thin liquid, puree, and solid, and appeared to tolerate all consistencies without oral difficulty or overt s/s aspiration. Recommend restarting po diet, beginning with dys 2 (finely chopped) and thin liquids. Recommend meds whole in puree, one at a time. Also recommend scheduling Mestinon for administration approximately one hour prior to po intake to allow for maximum benefit. Safe swallow precautions posted at [facility] and reviewed with pt and family. RN and MD also informed of results. ST will follow for assessment of diet tolerance, readiness to advance diet, and education.    HPI HPI: Howard Thompson is a 52 y.o. male admitted on 7/27 s/p unwitnessed out of hospital cardiac arrest, VF, unknown downtime, past medical history of coronary artery disease, myasthenia gravis, hypertension.  Per wife, pt has had hx of dysphagia related to MG which is largely resolved.  Pt was intubated from 7/27 - 8/2.      SLP Plan  Continue with current plan of care       Recommendations  Diet recommendations: Dysphagia 2 (fine chop);Thin liquid Liquids provided via: Straw;Cup Medication Administration: Whole meds with puree Supervision: Full supervision/cueing for compensatory strategies;Staff to assist with self feeding Compensations: Minimize environmental distractions;Slow rate;Small sips/bites Postural Changes and/or Swallow Maneuvers: Seated  upright 90 degrees;Upright 30-60 min after meal                General recommendations: Rehab consult Oral Care Recommendations: Oral care QID SLP Visit Diagnosis: Dysphagia, oropharyngeal phase (R13.12) Plan: Continue with current plan of care       GO               Caterin Tabares B. Quentin [facility], CCC-SLP Speech Language Pathologist [phone]  Shonna Chock 06/10/2018, 3:23 PM

## 2018-06-10 NOTE — Progress Notes (Signed)
Subjective: Interval History: has complaints bored.  Objective: Vital signs in last 24 hours: Temp:  [97.7 F (36.5 C)-99.3 F (37.4 C)] 98.1 F (36.7 C) (08/15 0334) Pulse Rate:  [61-143] 72 (08/15 0600) Resp:  [12-29] 29 (08/15 0600) BP: (125-162)/(36-108) 131/80 (08/15 0600) SpO2:  [81 %-100 %] 100 % (08/15 0600) Weight:  [57 kg-57.9 kg] 57 kg (08/15 0500) Weight change: -2.2 kg  Intake/Output from previous day: 08/14 0701 - 08/15 0700 In: 1136.2 [I.V.:496.3; Blood:315; NG/GT:275; IV Piggyback:50] Out: 3815  Intake/Output this shift: Total I/O In: 534.6 [I.V.:264.7; NG/GT:220; IV Piggyback:50] Out: -   General appearance: cooperative, pale, slowed mentation and Ox2 Resp: clear to auscultation bilaterally Chest wall: RIJ PC Cardio: S1, S2 normal and systolic murmur: holosystolic 2/6, blowing at apex GI: soft ,pos bs, liver down 4 cm Extremities: extremities normal, atraumatic, no cyanosis or edema  Lab Results: Recent Labs    06/09/18 0339 06/10/18 0230  WBC 8.8 6.6  HGB 7.0* 7.1*  HCT 21.8* 21.7*  PLT 224 120*   BMET:  Recent Labs    06/09/18 0339 06/10/18 0230  NA 136 136  K 5.1 4.6  CL 97* 97*  CO2 23 27  GLUCOSE 113* 100*  BUN 140* 84*  CREATININE 8.96* 5.88*  CALCIUM 8.1* 7.7*   No results for input(s): PTH in the last 72 hours. Iron Studies: No results for input(s): IRON, TIBC, TRANSFERRIN, FERRITIN in the last 72 hours.  Studies/Results: No results found.  I have reviewed the patient's current medications.  Assessment/Plan: 1 AKI oliguric ATN ischemic, and toxic ie contrast.  Did well at HD, plan in am 2 Anemia GIB and renal , cont GI eval, esa, Fe 3 substance abuse 4 Myasthenia 5 Aggitation related to anoxia, ? Drug withdrawal 6 Debill P HD in am, wean precedex.     LOS: 19 days   Howard Thompson 06/10/2018,6:58 AM

## 2018-06-10 NOTE — Progress Notes (Signed)
OT Cancellation    06/10/18 0900  OT Visit Information  Last OT Received On 06/10/18  Reason Eval/Treat Not Completed Patient at procedure or test/ unavailable  Maurie Boettcher, OT/L  OT Clinical Specialist [phone]

## 2018-06-10 NOTE — Anesthesia Postprocedure Evaluation (Signed)
Anesthesia Post Note  Patient: Howard Thompson  Procedure(s) Performed: ESOPHAGOGASTRODUODENOSCOPY (EGD) WITH PROPOFOL (N/A )     Patient location during evaluation: PACU Anesthesia Type: MAC Level of consciousness: awake and alert Pain management: pain level controlled Vital Signs Assessment: post-procedure vital signs reviewed and stable Respiratory status: spontaneous breathing, nonlabored ventilation and respiratory function stable Cardiovascular status: stable and blood pressure returned to baseline Anesthetic complications: no    Last Vitals:  Vitals:   06/10/18 1050 06/10/18 1136  BP: (!) 161/65   Pulse: 72   Resp: 18   Temp:  (!) 36.1 C  SpO2: 99%     Last Pain:  Vitals:   06/10/18 1136  TempSrc: Oral  PainSc:                  Audry Pili

## 2018-06-10 NOTE — Progress Notes (Signed)
   RN calling   Patient again having some abd pain Duodenitis sen on EGD  Plan Continue pepcid Add protonix Gi cocktail x 1 Trop check now  Dr. Brand Males, M.D., [facility].C.P Pulmonary and Critical Care Medicine Staff Physician, [place] Director - Interstitial Lung Disease  Program  Pulmonary [place] [facility] [place], [place], [address]  Pager: [phone], If no answer or between  15:00h - 7:00h: call [phone] Telephone: [phone]

## 2018-06-11 DIAGNOSIS — K921 Melena: Secondary | ICD-10-CM

## 2018-06-11 DIAGNOSIS — E43 Unspecified severe protein-calorie malnutrition: Secondary | ICD-10-CM

## 2018-06-11 LAB — OCCULT BLOOD X 1 CARD TO LAB, STOOL: Fecal Occult Bld: POSITIVE — AB

## 2018-06-11 LAB — GLUCOSE, CAPILLARY
GLUCOSE-CAPILLARY: 114 mg/dL — AB (ref 70–99)
GLUCOSE-CAPILLARY: 83 mg/dL (ref 70–99)
GLUCOSE-CAPILLARY: 88 mg/dL (ref 70–99)
Glucose-Capillary: 90 mg/dL (ref 70–99)
Glucose-Capillary: 91 mg/dL (ref 70–99)
Glucose-Capillary: 98 mg/dL (ref 70–99)

## 2018-06-11 LAB — CBC WITH DIFFERENTIAL/PLATELET
Abs Immature Granulocytes: 0.1 10*3/uL (ref 0.0–0.1)
BASOS ABS: 0.1 10*3/uL (ref 0.0–0.1)
BASOS PCT: 1 %
EOS ABS: 0.1 10*3/uL (ref 0.0–0.7)
Eosinophils Relative: 1 %
HCT: 20.8 % — ABNORMAL LOW (ref 39.0–52.0)
Hemoglobin: 6.5 g/dL — CL (ref 13.0–17.0)
Immature Granulocytes: 1 %
Lymphocytes Relative: 9 %
Lymphs Abs: 0.7 10*3/uL (ref 0.7–4.0)
MCH: 28.6 pg (ref 26.0–34.0)
MCHC: 31.3 g/dL (ref 30.0–36.0)
MCV: 91.6 fL (ref 78.0–100.0)
MONO ABS: 0.8 10*3/uL (ref 0.1–1.0)
MONOS PCT: 11 %
Neutro Abs: 5.7 10*3/uL (ref 1.7–7.7)
Neutrophils Relative %: 77 %
PLATELETS: 140 10*3/uL — AB (ref 150–400)
RBC: 2.27 MIL/uL — ABNORMAL LOW (ref 4.22–5.81)
RDW: 19.3 % — AB (ref 11.5–15.5)
WBC: 7.4 10*3/uL (ref 4.0–10.5)

## 2018-06-11 LAB — CBC
HEMATOCRIT: 28.6 % — AB (ref 39.0–52.0)
HEMOGLOBIN: 9.2 g/dL — AB (ref 13.0–17.0)
MCH: 29 pg (ref 26.0–34.0)
MCHC: 32.2 g/dL (ref 30.0–36.0)
MCV: 90.2 fL (ref 78.0–100.0)
Platelets: 94 10*3/uL — ABNORMAL LOW (ref 150–400)
RBC: 3.17 MIL/uL — AB (ref 4.22–5.81)
RDW: 18.3 % — ABNORMAL HIGH (ref 11.5–15.5)
WBC: 9.2 10*3/uL (ref 4.0–10.5)

## 2018-06-11 LAB — TROPONIN I: Troponin I: 0.06 ng/mL (ref <0.03)

## 2018-06-11 LAB — RENAL FUNCTION PANEL
Albumin: 2.1 g/dL — ABNORMAL LOW (ref 3.5–5.0)
Anion gap: 17 — ABNORMAL HIGH (ref 5–15)
BUN: 128 mg/dL — AB (ref 6–20)
CHLORIDE: 98 mmol/L (ref 98–111)
CO2: 21 mmol/L — AB (ref 22–32)
Calcium: 8 mg/dL — ABNORMAL LOW (ref 8.9–10.3)
Creatinine, Ser: 8.49 mg/dL — ABNORMAL HIGH (ref 0.61–1.24)
GFR calc Af Amer: 7 mL/min — ABNORMAL LOW (ref >60)
GFR, EST NON AFRICAN AMERICAN: 6 mL/min — AB (ref >60)
Glucose, Bld: 96 mg/dL (ref 70–99)
POTASSIUM: 5 mmol/L (ref 3.5–5.1)
Phosphorus: 7.8 mg/dL — ABNORMAL HIGH (ref 2.5–4.6)
Sodium: 136 mmol/L (ref 135–145)

## 2018-06-11 LAB — PREPARE RBC (CROSSMATCH)

## 2018-06-11 LAB — MAGNESIUM: MAGNESIUM: 2.7 mg/dL — AB (ref 1.7–2.4)

## 2018-06-11 MED ORDER — DICYCLOMINE HCL 10 MG PO CAPS
10.0000 mg | ORAL_CAPSULE | Freq: Three times a day (TID) | ORAL | Status: DC
  Administered 2018-06-11 – 2018-06-14 (×9): 10 mg via ORAL
  Filled 2018-06-11 (×11): qty 1

## 2018-06-11 MED ORDER — ASPIRIN 81 MG PO CHEW
81.0000 mg | CHEWABLE_TABLET | Freq: Every day | ORAL | Status: DC
  Administered 2018-06-12 – 2018-06-21 (×10): 81 mg via ORAL
  Filled 2018-06-11 (×10): qty 1

## 2018-06-11 MED ORDER — SODIUM CHLORIDE 0.9 % IV SOLN: 100.0000 mL | INTRAVENOUS | Status: DC | PRN

## 2018-06-11 MED ORDER — ALTEPLASE 2 MG IJ SOLR
2.0000 mg | Freq: Once | INTRAMUSCULAR | Status: DC | PRN
  Filled 2018-06-11: qty 2

## 2018-06-11 MED ORDER — LIDOCAINE HCL (PF) 1 % IJ SOLN: 5.0000 mL | INTRAMUSCULAR | Status: DC | PRN

## 2018-06-11 MED ORDER — PANTOPRAZOLE SODIUM 40 MG PO TBEC
40.0000 mg | DELAYED_RELEASE_TABLET | Freq: Two times a day (BID) | ORAL | Status: DC
  Administered 2018-06-11 – 2018-06-21 (×21): 40 mg via ORAL
  Filled 2018-06-11 (×21): qty 1

## 2018-06-11 MED ORDER — DARBEPOETIN ALFA 200 MCG/0.4ML IJ SOSY
PREFILLED_SYRINGE | INTRAMUSCULAR | Status: AC
  Filled 2018-06-11: qty 0.4

## 2018-06-11 MED ORDER — TICAGRELOR 90 MG PO TABS
90.0000 mg | ORAL_TABLET | Freq: Two times a day (BID) | ORAL | Status: DC
  Administered 2018-06-11 – 2018-06-21 (×20): 90 mg via ORAL
  Filled 2018-06-11 (×22): qty 1

## 2018-06-11 MED ORDER — SODIUM CHLORIDE 0.9% IV SOLUTION
Freq: Once | INTRAVENOUS | Status: AC
  Administered 2018-06-11: 06:00:00 via INTRAVENOUS

## 2018-06-11 MED ORDER — LIDOCAINE-PRILOCAINE 2.5-2.5 % EX CREA
1.0000 "application " | TOPICAL_CREAM | CUTANEOUS | Status: DC | PRN
  Filled 2018-06-11: qty 5

## 2018-06-11 MED ORDER — PENTAFLUOROPROP-TETRAFLUOROETH EX AERO: 1.0000 "application " | INHALATION_SPRAY | CUTANEOUS | Status: DC | PRN

## 2018-06-11 MED ORDER — NEBIVOLOL HCL 5 MG PO TABS
5.0000 mg | ORAL_TABLET | Freq: Every day | ORAL | Status: DC
  Administered 2018-06-11: 5 mg via ORAL
  Filled 2018-06-11 (×2): qty 1

## 2018-06-11 MED ORDER — HEPARIN SODIUM (PORCINE) 1000 UNIT/ML DIALYSIS: 1000.0000 [IU] | INTRAMUSCULAR | Status: DC | PRN

## 2018-06-11 MED ORDER — ATORVASTATIN CALCIUM 40 MG PO TABS
40.0000 mg | ORAL_TABLET | Freq: Every day | ORAL | Status: DC
  Administered 2018-06-11 – 2018-06-20 (×9): 40 mg via ORAL
  Filled 2018-06-11 (×10): qty 1

## 2018-06-11 MED ORDER — IPRATROPIUM-ALBUTEROL 0.5-2.5 (3) MG/3ML IN SOLN: 3.0000 mL | RESPIRATORY_TRACT | Status: DC | PRN

## 2018-06-11 NOTE — Progress Notes (Addendum)
  Speech Language Pathology Treatment: Dysphagia  Patient Details Name: Howard Thompson MRN: [id number] DOB: 25-Oct-1966 Today's Date: 06/11/2018 Time: 1250-1300 SLP Time Calculation (min) (ACUTE ONLY): 10 min  Assessment / Plan / Recommendation Clinical Impression  Pt seen at bedside for assessment of diet tolerance and education, following BSE completed 06/10/18. Per RN, pt has tolerated dys 2 solids and thin liquids, however, has had significantly poor intake since po diet initiated. Cortrak still in place. Pt reluctantly accepted small boluses of thin liquid via straw. No overt s/s aspiration observed. Pt refused additional po trials. Safe swallow precautions posted at [facility], including strategies for safe po with myasthenia gravis and recommendation for Mestinon ~1hour prior to meals. Pt flat affect and minimal interaction. SLP will continue to follow for diet tolerance assessment, and to determine readiness to advance solids.     HPI HPI: Howard Thompson is a 52 y.o. male admitted on 7/27 s/p unwitnessed out of hospital cardiac arrest, VF, unknown downtime, past medical history of coronary artery disease, myasthenia gravis, hypertension.  Per wife, pt has had hx of dysphagia related to MG which is largely resolved.  Pt was intubated from 7/27 - 8/2.      SLP Plan  Continue with current plan of care       Recommendations  Diet recommendations: Thin liquid;Dysphagia 2 (fine chop) Liquids provided via: Straw;Cup Medication Administration: Whole meds with puree Supervision: Full supervision/cueing for compensatory strategies;Staff to assist with self feeding Compensations: Minimize environmental distractions;Slow rate;Small sips/bites Postural Changes and/or Swallow Maneuvers: Seated upright 90 degrees;Upright 30-60 min after meal                General recommendations: Rehab consult Oral Care Recommendations: Oral care QID SLP Visit Diagnosis: Dysphagia, oropharyngeal phase  (R13.12) Plan: Continue with current plan of care       GO              Keeli Roberg B. Quentin Ore [facility], CCC-SLP Speech Language Pathologist [phone]   Shonna Chock 06/11/2018, 1:09 PM

## 2018-06-11 NOTE — Progress Notes (Addendum)
Patient ID: Howard Thompson, male   DOB: 1966-07-06, 52 y.o.   MRN: [id number]    Progress Note   Subjective  Asked to re- evaluate for continued drift  in hgb and blackish stool  Hospital day #20  LAD 100 % occlusion /vfib arrest - On Brillinta /ASA  EGD 8/15-mild duodenitis wth friability / no ulceration   Nursing reports several liquid black stools overnight, no obvious blood  HGb 7.1- to 6.5 - received one unit early am   On dialysis currently  Wife at bedside  Pt c/o abdominal tenderness - says has been having abdominal pain hard to quantify, no N/V   Objective   Vital signs in last 24 hours: Temp:  [97 F (36.1 C)-98.8 F (37.1 C)] 97.9 F (36.6 C) (08/16 0822) Pulse Rate:  [58-82] 80 (08/16 1115) Resp:  [7-26] 16 (08/16 1115) BP: (102-173)/(67-98) 150/80 (08/16 1115) SpO2:  [92 %-100 %] 100 % (08/16 1115) Weight:  [56 kg-56.6 kg] 56.6 kg (08/16 0710) Last BM Date: 06/11/18 General:    white male  in NAD ill appearing alert - nasal tube in place Heart:  Regular rate and rhythm; no murmurs Lungs: Respirations even and unlabored, lungs CTA bilaterally Abdomen:  Soft, tender  Rather generalized and nondistended. Normal bowel sounds. Extremities:  Without edema. Neurologic:  Alert and oriented,   Psych:  Cooperative..  Intake/Output from previous day: 08/15 0701 - 08/16 0700 In: 749.5 [I.V.:469.5; NG/GT:180; IV Piggyback:100] Out: 0  Intake/Output this shift: Total I/O In: 325 [I.V.:10; Blood:315] Out: -   Lab Results: Recent Labs    06/09/18 0339 06/10/18 0230 06/11/18 0244  WBC 8.8 6.6 7.4  HGB 7.0* 7.1* 6.5*  HCT 21.8* 21.7* 20.8*  PLT 224 120* 140*   BMET Recent Labs    06/09/18 0339 06/10/18 0230 06/11/18 0244  NA 136 136 136  K 5.1 4.6 5.0  CL 97* 97* 98  CO2 23 27 21*  GLUCOSE 113* 100* 96  BUN 140* 84* 128*  CREATININE 8.96* 5.88* 8.49*  CALCIUM 8.1* 7.7* 8.0*   LFT Recent Labs    06/10/18 0230 06/11/18 0244  PROT 5.1*  --     ALBUMIN 2.0* 2.1*  AST 36  --   ALT 16  --   ALKPHOS 101  --   BILITOT 0.7  --   BILIDIR 0.2  --   IBILI 0.5  --    PT/INR No results for input(s): LABPROT, INR in the last 72 hours.  Studies/Results: No results found.     Assessment / Plan:    #1  52 yo WM  S/p Vfib arrest /STEMI  05/22/18 - s/p DES LAD for 100 % occlusion on ASA /Brillinta #2 anoxic encephalopathy / withdrawal - Precedex #3 ARF - on dialysis  #4 Myasthenia gravis #5 anemia, continue transfusion requirement, dark stools -  he is likely oozing from small bowel in setting of anticoagulation. He may have some areas of small bowel ischemia post Vfib arrest  Would expect bleeding more obvious if colonic - not a candidate for colonoscopy at present No source found at EGD  Plan:  Continue to transfuse to keep hgb 7-8 Next step would be CTA abd/pelvis with IV and oral contrast - will need RENAL to clear use of IV contrast before ordering.   Active Problems:   ACS (acute coronary syndrome) (HCC)   ST elevation myocardial infarction (STEMI) [facility])   Ventricular fibrillation ([place])   Cardiac arrest ([place])   Shock (  [place])   Elevated LFTs   Acute renal failure (HCC)   History of ETT   Thrombocytopenia (HCC)   Substance abuse ([place])   Cocaine abuse ([place])   Encephalopathy acute   AKI (acute kidney injury) ([place])   Ischemic cardiomyopathy   Acute respiratory failure (HCC)   Hyperlipidemia LDL goal <70   Occult blood in stools   Anemia due to chronic blood loss    LOS: 20 days   Amy Esterwood  06/11/2018, 11:19 AM      Attending physician's note   I have taken a history, examined the patient and reviewed the chart. I agree with the Advanced Practitioner's note, impression and recommendations. Dark liquid stool and drifting Hb. Mild duodenitis noted at EGD is not the source of bleeding. Suspected SB source. CTA would be next step however IV contrast would be necessary. If CTA not feasible then a nuclear tagged RBC  scan. Transfuse to keep Hb  > 7-8. Consider holding Brilinta.   Lucio Edward, MD FACG [phone] office

## 2018-06-11 NOTE — Procedures (Signed)
I was present at this session.  I have reviewed the session itself and made appropriate changes.  Hd via PC . bp ^, lower dry.  transfuse  Howard Thompson 8/16/20197:15 AM

## 2018-06-11 NOTE — Progress Notes (Addendum)
Progress Note  Patient Name: Howard Thompson Date of Encounter: 06/11/2018  Primary Cardiologist: No primary care provider on file.  Subjective   Alert; black strool  Inpatient Medications    Scheduled Meds: . sodium chloride   Intravenous Once  . sodium chloride   Intravenous Once  . [START ON 06/12/2018] aspirin  81 mg Oral Daily  . atorvastatin  40 mg Oral q1800  . chlorhexidine  15 mL Mouth Rinse BID  . Chlorhexidine Gluconate Cloth  6 each Topical Q0600  . Chlorhexidine Gluconate Cloth  6 each Topical Q0600  . Chlorhexidine Gluconate Cloth  6 each Topical Q0600  . darbepoetin (ARANESP) injection - DIALYSIS  200 mcg Intravenous Q Fri-HD  . feeding supplement (NEPRO CARB STEADY)  1,000 mL Per Tube Q24H  . heparin  40 Units/kg Dialysis Once in dialysis  . insulin aspart  0-9 Units Subcutaneous Q4H  . LORazepam  0.5 mg Oral TID  . mouth rinse  15 mL Mouth Rinse q12n4p  . multivitamin  1 tablet Oral QHS  . nebivolol  5 mg Oral Daily  . pantoprazole  40 mg Oral BID  . pyridostigmine  60 mg Oral Q8H  . QUEtiapine  50 mg Oral BID  . sevelamer carbonate  2.4 g Per Tube TID WC  . sodium chloride flush  3 mL Intravenous Q12H  . ticagrelor  90 mg Oral BID   Continuous Infusions: . sodium chloride 250 mL (06/05/18 2138)  . sodium chloride    . sodium chloride    . sodium chloride    . sodium chloride    . dexmedetomidine (PRECEDEX) IV infusion Stopped (06/10/18 1429)  . ferumoxytol 510 mg (06/07/18 0926)   PRN Meds: sodium chloride, sodium chloride, sodium chloride, sodium chloride, sodium chloride, albuterol, alteplase, alteplase, docusate, Gerhardt's butt cream, heparin, heparin, hydrALAZINE, ipratropium-albuterol, labetalol, lidocaine (PF), lidocaine-prilocaine, LORazepam, nitroGLYCERIN, pentafluoroprop-tetrafluoroeth, sodium chloride flush   Vital Signs    Vitals:   06/11/18 1115 06/11/18 1130 06/11/18 1146 06/11/18 1200  BP: (!) 150/80 (!) 132/107 131/79 134/70    Pulse: 80 85 89 87  Resp: 16 20 19 20   Temp:   98.1 F (36.7 C)   TempSrc:   Oral   SpO2: 100% 100% 100% 98%  Weight:   53.1 kg   Height:        Intake/Output Summary (Last 24 hours) at 06/11/2018 1402 Last data filed at 06/11/2018 1146 Gross per 24 hour  Intake 454.53 ml  Output 4000 ml  Net -3545.47 ml   Filed Weights   06/11/18 0500 06/11/18 0710 06/11/18 1146  Weight: 56 kg 56.6 kg 53.1 kg    Telemetry    SR - Personally Reviewed  Physical Exam   General: no acute distress Head: [place].AT Neck: JVD 7 cm Lungs: no wheezing Heart: RRR 1-2 systolic murmur Abdomen: BS +; soft Pulses  Prominent L iliac artery ? Mildly aneurysmal; no hematoma Extremities: no edema Neuro: more alert, moves all extremities.  Labs    Chemistry Recent Labs  Lab 06/08/18 0416 06/09/18 0339 06/10/18 0230 06/11/18 0244  NA 136 136 136 136  K 4.8 5.1 4.6 5.0  CL 97* 97* 97* 98  CO2 27 23 27  21*  GLUCOSE 103* 113* 100* 96  BUN 77* 140* 84* 128*  CREATININE 6.39* 8.96* 5.88* 8.49*  CALCIUM 7.9* 8.1* 7.7* 8.0*  PROT 5.1* 5.4* 5.1*  --   ALBUMIN 1.9*  1.9* 2.0*  2.0* 2.0* 2.1*  AST 35 35  36  --   ALT 12 13 16   --   ALKPHOS 121 107 101  --   BILITOT 0.9 0.8 0.7  --   GFRNONAA 9* 6* 10* 6*  GFRAA 11* 7* 12* 7*  ANIONGAP 12 16* 12 17*     Hematology Recent Labs  Lab 06/09/18 0339 06/10/18 0230 06/11/18 0244  WBC 8.8 6.6 7.4  RBC 2.48* 2.41* 2.27*  HGB 7.0* 7.1* 6.5*  HCT 21.8* 21.7* 20.8*  MCV 87.9 90.0 91.6  MCH 28.2 29.5 28.6  MCHC 32.1 32.7 31.3  RDW 19.8* 18.7* 19.3*  PLT 224 120* 140*    Cardiac Enzymes Recent Labs  Lab 06/10/18 1910 06/11/18 0244  TROPONINI 0.06* 0.06*   No results for input(s): TROPIPOC in the last 168 hours.   BNPNo results for input(s): BNP, PROBNP in the last 168 hours.   DDimer No results for input(s): DDIMER in the last 168 hours.   Lipid Panel     Component Value Date/Time   CHOL 125 05/22/2018 1745   TRIG 59 05/22/2018 2026    HDL 40 (L) 05/22/2018 1745   CHOLHDL 3.1 05/22/2018 1745   VLDL 11 05/22/2018 1745   LDLCALC 74 05/22/2018 1745    Radiology    No results found.  Cardiac Studies   Cath: 05/22/18    Ost LAD to Prox LAD lesion is 100% stenosed.  A drug-eluting stent was successfully placed using a STENT SYNERGY DES 3X20.  Post intervention, there is a 0% residual stenosis.  There is severe left ventricular systolic dysfunction.  LV end diastolic pressure is mildly elevated.  The left ventricular ejection fraction is less than 25% by visual estimate.   TTE: 05/23/18  Study Conclusions  - Left ventricle: The cavity size was normal. Systolic function was severely reduced. The estimated ejection fraction was in the range of 25% to 30%. Severe diffuse hypokinesis with regional variations. There is akinesis of the apicalanterior and apical myocardium. There is akinesis of the midanteroseptal myocardium. There is severe hypokinesis of the inferoseptal myocardium. Features are consistent with a pseudonormal left ventricular filling pattern, with concomitant abnormal relaxation and increased filling pressure (grade 2 diastolic dysfunction). Doppler parameters are consistent with high ventricular filling pressure. - Aortic valve: A bicuspid morphology cannot be excluded; mildly thickened, mildly calcified leaflets. There was mild to moderate regurgitation. Regurgitation pressure half-time: 718 ms. - Mitral valve: There was mild to moderate regurgitation. Valve area by pressure half-time: 2.39 cm^2. - Right ventricle: Systolic function was moderately reduced. - Atrial septum: There was increased thickness of the septum, consistent with lipomatous hypertrophy. - Tricuspid valve: There was mild regurgitation. - Pulmonary arteries: PA peak pressure: 33 mm Hg (S). - Inferior vena cava: The vessel was dilated. The respirophasic diameter changes were blunted  (<50%), consistent with elevated central venous pressure. - Pericardium, extracardiac: A small, free-flowing pericardial effusion was identified circumferential to the heart. The fluid had no internal echoes.There was no evidence of hemodynamic compromise.  Impressions:  - severe LV dysfunction with diffuse HK but apica/anteroseptal and apical anterior AK. Recommend definity contrast study to rule out apical thrombus.   ------------------------------------------------------------------- 06/09/2018 ECHO Study Conclusions  - Left ventricle: The cavity size was normal. Systolic function was   mildly to moderately reduced. The estimated ejection fraction was   in the range of 40% to 45%. Images were inadequate for LV wall   motion assessment. The study is not technically sufficient to   allow evaluation of LV diastolic  function. - Aortic valve: There was mild regurgitation. - Mitral valve: Calcified annulus. There was mild regurgitation. - Atrial septum: There was increased thickness of the septum,   consistent with lipomatous hypertrophy. - Pulmonary arteries: Systolic pressure could not be accurately   estimated.  Recommendations:  Suggest definity contrast study to evaluate wall motion. Complications:  Apical images were extremely painful for the patient due to his broken ribs.  Patient Profile     52 y.o. male with anout of hospital cardiac arrest, prior history of MI several years ago, myasthenia gravis post thymus resection, anterior ST elevation post LAD stent. Extubated on 8/2.  Assessment & Plan    1. Cardiac arrest: Underwent cath with PCI/DES x1 to the pLADon 05/22/18. Planned for DAPT with ASA/Brilinta for 12 months.  -Continue  ASA 81 mg and Brilinta 90 mg twice daily per NG tube as he is not taking p.o. due to delirium.  -in light of h/o cocaine use, lopressor changedto carvedilol 6.25mg  BID, but due to potential concern by pulmonolgy of lung  issues from non-cardioselectivity will consider changing to bystolic for cardioselectivity and with nitric oxide benefit to reduce vasospasm   2. ICM -EF noted at 25-30% on echo  05/23/2018;  Repeat today shows significant improvement with EF 40 - 45%, mild MR -No room for ARB/ACEi given AKF -continue carvedilol  3. Anoxic encephalopathy - EEG was abnormal but improved -Neurofollowing and on Seroquel/Ativan,  Precedex stopped on 8/15. Mestinin restarted with MG.  4. Acute renal failure: -Nephrology following.  -S/p placement of new dialysis catheter right subclavian.  -Creatinine today 8.49 today -per nephrology  5. HLD: now back on statin; LFTs normal;  Will increase atorvastatin to 40 mg.   5. Polysubstance abuse:+ for cocaine on admission.   6. Acute blood loss anemia felt to be 2/2 to bleeding HD cath: - CT abd negative for acute bleed -  Received IV iron.   - Ideally would like Hgb above 8 given coronary issues but need to balance with volume issues. Continue to monitor Hgb with dialysis.  - EGD on 8/15 with mild duodenitis with friability/ no ulceration  Hb 7.1 dropped to 6.5 today  received an additional unit PRBC. - Black stools;  Suspected SB source. - Consider changing brilinta to plavix if persists, but then check P2Y 12 test to make sure plavix responsive.  7. Myathsthenia gravis on mestinon  8. Prominent Left iliac artery ? Mildly aneursmal   Signed, Shelva Majestic, MD  06/11/2018, 2:02 PM    For questions or updates, please contact [place] Please consult [email or web address] for contact info under Cardiology/STEMI.

## 2018-06-11 NOTE — Progress Notes (Signed)
CRITICAL VALUE ALERT  Critical Value: HGB 6.5  Date & Time Notified: 06/11/18 @ 0300  Provider Notified: Warren Lacy Provider   Orders Received: Transfuse 1 unit PRBCs

## 2018-06-11 NOTE — Progress Notes (Signed)
Subjective: Interval History: has no complaint .  More alert off Precedex.  Objective: Vital signs in last 24 hours: Temp:  [97 F (36.1 C)-98.8 F (37.1 C)] 98 F (36.7 C) (08/16 0625) Pulse Rate:  [58-78] 73 (08/16 0625) Resp:  [7-26] 18 (08/16 0625) BP: (133-169)/(64-98) 152/83 (08/16 0625) SpO2:  [92 %-100 %] 100 % (08/16 0625) Weight:  [56 kg-57 kg] 56 kg (08/16 0500) Weight change: -0.9 kg  Intake/Output from previous day: 08/15 0701 - 08/16 0700 In: 734.5 [I.V.:454.5; NG/GT:180; IV Piggyback:100] Out: 0  Intake/Output this shift: No intake/output data recorded.  General appearance: alert, cooperative, no distress and pale Resp: rhonchi bibasilar Chest wall: RIJ PC Cardio: S1, S2 normal and systolic murmur: systolic ejection 2/6, decrescendo at 2nd left intercostal space GI: liver down 5 cm Extremities: extremities normal, atraumatic, no cyanosis or edema  Lab Results: Recent Labs    06/10/18 0230 06/11/18 0244  WBC 6.6 7.4  HGB 7.1* 6.5*  HCT 21.7* 20.8*  PLT 120* 140*   BMET:  Recent Labs    06/10/18 0230 06/11/18 0244  NA 136 136  K 4.6 5.0  CL 97* 98  CO2 27 21*  GLUCOSE 100* 96  BUN 84* 128*  CREATININE 5.88* 8.49*  CALCIUM 7.7* 8.0*   No results for input(s): PTH in the last 72 hours. Iron Studies: No results for input(s): IRON, TIBC, TRANSFERRIN, FERRITIN in the last 72 hours.  Studies/Results: No results found.  I have reviewed the patient's current medications.  Assessment/Plan: 1 AKI oliguric, toxic and ischemic ATN.  For HD. Vol xs yet. Will need perm access next week if not better. 2 HTN lower vol 3 Substance abuse 4 CAD per Cards 5 Confusion improved 6 GIB ?? Gastritis Cannot use Carafate long due to Al 7 MG  P Hd, esa, tx GIB.  Access soon if not better    LOS: 20 days   Jeneen Rinks Paighton Godette 06/11/2018,7:11 AM

## 2018-06-11 NOTE — Progress Notes (Addendum)
PULMONARY / CRITICAL CARE MEDICINE   Name: Howard Thompson MRN: [id number] DOB: 1966-08-18    ADMISSION DATE:  05/22/2018 CONSULTATION DATE: 06/05/2018 LOS 20 days  REFERRING MD: [facility]  CHIEF COMPLAINT: Post cardiac arrest.  Acute renal failure.  Delirium.  Polysubstance abuse. BRIEF  52 year old with history of tobacco abuse substance abuse alcohol abuse cocaine abuse who had out of hospital arrest 05/22/2018.  He has undergone cardiac cath with LAD stent.  He had blood loss anemia secondary to HD catheter tunnel placed his require a transfusion.  His main problem is agitation and delirium and is transferred to the intensive care unit on 06/05/2018 for use of Precedex.  There is some concern of questionable retroperitoneal bleed although his abdomen is soft and nontender.  Transfused 06/06/2018 and consideration for CT of the abdomen is being entertained.    STUDIES:    CULTURES: 06/04/2018 blood cultures obtained  ANTIBIOTICS:   SIGNIFICANT EVENTS: 7/27 cardiac cath  LINES/TUBES: Right tunneled HD catheter>>    EVENTS 06/06/18 - 38 year old who continues to have anemia with no apparent blood loss.  Delirium and agitation and combative behavior continue p.o. Issue. CT abd - no RP bleed. Left CFA with 1.5cm dilataion   8/12 - on precedex. Still needing it due to agitated delirium. Not on vent on Room air. On precedex 1.2. Neuro wanting MRI - but agiation interfering. Anuria continues - on iHD - MWF. Currently on HD. S/p PRBC yesterday. No active bleeding     8/13 - per neuro -> myasthenic weakness by end of day cotnributing to fatigue and dysarthria and secondary agitation. Mestinon. Now sitting in chair. But remains on precedex and oriented.  No HD today   8/14 - per neuro ptossis and dysarthria better after mestinon but could be morning effect. Neuro planning to increase mestinon. Follows with neuro at [facility] as opd. Does not think patient is in MG crisis. Currently on iHD.  Got agitated overnight - went up on prcedex. Worse at night. Still overall delirium mightt be improiving  Stool OB positive andhg dropped to 7 -> 1 unit prbc ordered and Pepcid changed to IV    06/11/2018 -> remains on precdex. Went to toilet in trash can last night. STool reported as "black" last night per RN. Had some abd pain last night and given fentanyl -> due to for endoscopy today in procedure suite with anesthesia support per GI. Still anuric. No HD today. Speecch only allows for ice chips due to dysphagia. Echo yesterday ef 45%  This am seems oriented - on precedex 0.8   . SUBJECTIVE/OVERNIGHT/INTERVAL HX 06/11/2018 - off precedex x 24h. More orietned. No sun downing last night.  On HD currently MWF schedule. Dropped hgb again and prbc overnight. EGD  yesterdaay  Only duodenitis but RN says continues to have tarry black stools (not on IRon  tablets). Abd pain yesterday evening before hgb drop -> given PPI and gi cocktail. On d2 diet  VITAL SIGNS: BP (!) 148/77   Pulse 68   Temp 97.9 F (36.6 C) (Oral)   Resp 19   Ht 5\' 10"  (1.778 m)   Wt 56.6 kg Comment: Bed Scale  SpO2 96%   BMI 17.90 kg/m   HEMODYNAMICS:    VENTILATOR SETTINGS:    INTAKE / OUTPUT: I/O last 3 completed shifts: In: 1294.1 [I.V.:744.1; NG/GT:400; IV Piggyback:150] Out: 0   PHYSICAL EXAMINATION:   General Appearance:    Looks deconditioned but improving  Head:    Normocephalic,  without obvious abnormality, atraumatic  Eyes:    PERRL - yes, conjunctiva/corneas - clear      Ears:    Normal external ear canals, both ears  Nose:   NG tube - yes  Throat:  ETT TUBE - no , OG tube - no  Neck:   Supple,  No enlargement/tenderness/nodules     Lungs:     Clear to auscultation bilaterally,   Chest wall:    No deformity. Has Rt subclavian HD cath  Heart:    S1 and S2 normal, no murmur, CVP - no.  Pressors - no  Abdomen:     Soft, no masses, no organomegaly  Genitalia:    Not done  Rectal:   not done   Extremities:   Extremities- intact. DECONDITONED. LEFT FEMORAL ARTERY a little bit mass like     Skin:   Intact in exposed areas .     Neurologic:   Sedation - none -> RASS - +1 . Moves all 4s - yes. CAM-ICU - neg  . Orientation - x3+.            PULMONARY Recent Labs  Lab 06/04/18 0924  PHART 7.515*  PCO2ART 33.7  PO2ART 71.6*  HCO3 27.0  O2SAT 95.1    CBC Recent Labs  Lab 06/09/18 0339 06/10/18 0230 06/11/18 0244  HGB 7.0* 7.1* 6.5*  HCT 21.8* 21.7* 20.8*  WBC 8.8 6.6 7.4  PLT 224 120* 140*    COAGULATION Recent Labs  Lab 06/04/18 1044  INR 1.23    CARDIAC   Recent Labs  Lab 06/10/18 1910 06/11/18 0244  TROPONINI 0.06* 0.06*   No results for input(s): PROBNP in the last 168 hours.   CHEMISTRY Recent Labs  Lab 06/07/18 0334 06/07/18 0652 06/08/18 0416 06/09/18 0339 06/10/18 0230 06/11/18 0244  NA 136 137 136 136 136 136  K 5.8* 6.2* 4.8 5.1 4.6 5.0  CL 99 97* 97* 97* 97* 98  CO2 20* 19* 27 23 27  21*  GLUCOSE 119* 121* 103* 113* 100* 96  BUN 156* 160* 77* 140* 84* 128*  CREATININE 11.04* 11.30* 6.39* 8.96* 5.88* 8.49*  CALCIUM 8.0* 8.1* 7.9* 8.1* 7.7* 8.0*  MG 3.3*  --  2.5* 2.9* 2.4 2.7*  PHOS 9.6* 10.3* 6.0* 7.1*  --  7.8*   Estimated Creatinine Clearance: 8.2 mL/min (A) (by C-G formula based on SCr of 8.49 mg/dL (H)).   LIVER Recent Labs  Lab 06/04/18 1044  06/06/18 0425 06/07/18 0334 06/07/18 4481 06/08/18 0416 06/09/18 0339 06/10/18 0230 06/11/18 0244  AST  --    < > 27 39  --  35 35 36  --   ALT  --    < > 8 12  --  12 13 16   --   ALKPHOS  --    < > 112 144*  --  121 107 101  --   BILITOT  --    < > 0.8 1.0  --  0.9 0.8 0.7  --   PROT  --    < > 5.3* 5.7*  --  5.1* 5.4* 5.1*  --   ALBUMIN  --    < > 2.1*  2.1* 2.3* 2.3* 1.9*  1.9* 2.0*  2.0* 2.0* 2.1*  INR 1.23  --   --   --   --   --   --   --   --    < > = values in this interval not displayed.  INFECTIOUS Recent Labs  Lab 06/04/18 1047  06/04/18 1228 06/05/18 0238 06/06/18 0425  LATICACIDVEN 1.0  --   --   --   PROCALCITON  --  3.00 4.85 5.34     ENDOCRINE CBG (last 3)  Recent Labs    06/10/18 2315 06/11/18 0239 06/11/18 0828  GLUCAP 87 83 90         IMAGING x48h  - image(s) personally visualized  -   highlighted in bold No results found. DISCUSSION: 52 year old with history of tobacco abuse substance abuse alcohol abuse cocaine abuse who had out of hospital arrest 05/22/2018.  He has undergone cardiac cath with LAD stent.  He had blood loss anemia secondary to HD catheter tunnel placed his require a transfusion.  His main problem is agitation and delirium and is transferred to the intensive care unit on 06/05/2018 for use of Precedex.  There is some concern of questionable retroperitoneal bleed although his abdomen is soft and nontender.  Transfused 06/06/2018 and consideration for CT of the abdomen is being entertained.  ASSESSMENT / PLAN:  PULMONARY A: History of tobacco abuse Most likely underlying COPD   - maintains resp status 06/11/2018 .  No wheeze  P:   Monitor duoneb prn    CARDIOVASCULAR A:  Out-of-hospital cardiac arrest Ventricular fibrillation Acute coronary syndrome Status post cardiac catheterization 05/22/2018 with stent LAD - On ASA and Brilinta for 12 months Left CFA - dilation 1.5cm on CT 06/06/18    -  ECHO 8/14 - ef 45% . Maintain BP/HR. Coreg increased 06/10/18. Might not be a candidate for Entresto per cards but they are deliberating  P:  Per cards - RN to alertr them 06/11/2018 about the Left Common fem artery  RENAL Lab Results  Component Value Date   CREATININE 8.49 (H) 06/11/2018   CREATININE 5.88 (H) 06/10/2018   CREATININE 8.96 (H) 06/09/2018   Recent Labs  Lab 06/09/18 0339 06/10/18 0230 06/11/18 0244  K 5.1 4.6 5.0    A:   Acute renal failure with anuria. On 1HD MWF. Last HD 06/08/18 and ongoing 06/11/18   - still anuric 06/11/2018   P:   Per  RENAL  GASTROINTESTINAL A:   Malnourished Dysphagia - cleared for D2 on 06/10/18  8/15 - concern for slow GI Bleed . Stool last night reported as "black" 06/10/18 and again 06/11/18. EGD 06/10/18 with duondenitis . Repeat transfusion again 815/19 - follows abdominal pain  P:   chagne Pepcid to PPI Recheck stool occult blood GI - Amy Easterwood PA - recalled D2 renal/carb diet   HEMATOLOGIC Recent Labs    06/10/18 0230 06/11/18 0244  HGB 7.1* 6.5*    A:   Anemia most likely multifactorial with recent bleeding episode from placement of tunneled HD catheter right IJ and DAPT. No RP bleed on CT 06/06/18   - s/p 1 unit PRBC 05/25/18, 06/02/18, 06/03/18 , 06/06/18, 06/07/18 x2 units and 06/09/18  And 06/10/18 - Stool OB +  x1   p:  - change goal hgb >/=  7gm% because LOS is 20 days (and is > 15 days since MI) - GI recalled   INFECTIOUS A:   Blood cultures x2 ordered on 06/05/2018  06/11/2018 - not on abx  P:   monitor   ENDOCRINE CBG (last 3)  Recent Labs    06/10/18 2315 06/11/18 0239 06/11/18 0828  GLUCAP 87 83 90    A:   No acute issues P:   Trend CBGs  NEUROLOGIC A: History of polysubstance abuse   History of myasthenia gravis   Agitation post cardiac arrest. Transfer back  to intensive care unit for Precedex on 06/05/2018. Off precedex since 06/10/18   06/11/2018 -   CAM-ICU neg for delirium while off precedex. Muscular strengith better since mestinon restart 06/08/18  P:   Precedex gtt - wean Start mestinon 06/08/18, increase mestinon dose 06/09/18 per neuro Can dol haldol prn if QTc allows RASS goal: 1    MSK  A: deconditioned  P PT/OT consult   CODE     Code Status Orders  (From admission, onward)         Start     Ordered   05/23/18 2136  Limited resuscitation (code)  Continuous    Question Answer Comment  In the event of cardiac or respiratory ARREST: Initiate Code Blue, Call Rapid Response Yes   In the event of cardiac or respiratory  ARREST: Perform CPR No   In the event of cardiac or respiratory ARREST: Perform Intubation/Mechanical Ventilation Yes   In the event of cardiac or respiratory ARREST: Use NIPPV/BiPAp only if indicated Yes   In the event of cardiac or respiratory ARREST: Administer ACLS medications if indicated Yes   In the event of cardiac or respiratory ARREST: Perform Defibrillation or Cardioversion if indicated Yes      05/23/18 2135        Code Status History    Date Active Date Inactive Code Status Order ID Comments User Context   05/22/2018 1957 05/23/2018 2135 Full Code [id number]  Omar Person, NP Inpatient   05/22/2018 1956 05/22/2018 1957 Full Code [id number]  Reyne Dumas, MD Inpatient   05/22/2018 1940 05/22/2018 1956 Full Code [id number]  Lorretta Harp, MD Inpatient   05/22/2018 1940 05/22/2018 1940 Full Code [id number]  Cristina Gong, MD Inpatient     [facility]  = wife wants pscy consult prior to dc due to hx of substance abuse - per [facility] prior to dc   FAMILY  - Updates: 06/06/2018 Sister updated at bedside.  RN updated family 06/08/18 late pm when they came).  Wife at bedside 06/11/2018 9:07 AM - updated in detail abou above. She is very worried about his substance abuse  DISPO =-move to renal floor for TRH pick up 06/12/18 - signed out to Dr Victoriano Lain     Dr. Brand Males, M.D., [facility].C.P Pulmonary and Critical Care Medicine Staff Physician [place] [facility] Pager: [phone], If no answer or between  15:00h - 7:00h: call [phone]  06/11/2018 8:53 AM

## 2018-06-11 NOTE — Progress Notes (Signed)
[place] Progress Note Patient Name: Howard Thompson DOB: 1966-04-15 MRN: [id number]   Date of Service  06/11/2018  HPI/Events of Note  Lab Results  Component Value Date   HGB 6.5 (LL) 06/11/2018     eICU Interventions  Transfuse 1 PRBC     Intervention Category Intermediate Interventions: Bleeding - evaluation and treatment with blood products  Mignonne Afonso L Lyndzie Zentz 06/11/2018, 3:21 AM

## 2018-06-11 NOTE — Progress Notes (Signed)
Physical Therapy Treatment Patient Details Name: Howard Thompson MRN: [id number] DOB: Feb 13, 1966 Today's Date: 06/11/2018    History of Present Illness 52 y.o. male out of hospital cardiac arrest (05/22/18), Intubated 7/27- 8/2.  s/p IJ tunneled HD catheter placed 05/31/18 with bleeding from IJ site. Pt with anoxic encephalopathy.Marland Kitchen PMHx: myasthenia gravis post thymus resection, HTN, anterior STEMI post LAD stent.     PT Comments    Pt reports being "very tired" after HD this morning, with maximal encouragement pt agrees to bed exercises. Pt participated in exercises until he required increased breathing and his ribs began to hurt with respiration and he requested to stop. Pt inquired as to why his ribs hurt so much "Did someone beat me up?".  PT will work on progressing mobility in next session.    Follow Up Recommendations  CIR;Supervision/Assistance - 24 hour     Equipment Recommendations  Other (comment)(to be determined at next venue)    Recommendations for Other Services       Precautions / Restrictions Precautions Precautions: Fall Precaution Comments: suboccipital pressure wound, watch SpO2 Restrictions Weight Bearing Restrictions: No    Mobility  Bed Mobility               General bed mobility comments: declined getting out of bed today                   Cognition Arousal/Alertness: Lethargic(lethargic initially ) Behavior During Therapy: Restless;Flat affect Overall Cognitive Status: Impaired/Different from baseline Area of Impairment: Orientation;Attention;Memory;Safety/judgement;Following commands;Awareness;Problem solving                 Orientation Level: Disoriented to;Time;Situation(keeps forgetting why his ribs hurt "Did someone beat me up?") Current Attention Level: Sustained Memory: Decreased recall of precautions;Decreased short-term memory Following Commands: Follows one step commands consistently Safety/Judgement: Decreased awareness  of safety;Decreased awareness of deficits Awareness: Emergent Problem Solving: Slow processing;Difficulty sequencing;Requires verbal cues;Requires tactile cues        Exercises General Exercises - Lower Extremity Ankle Circles/Pumps: AROM;Both;10 reps;Supine Gluteal Sets: AROM;Both;5 reps;Supine Heel Slides: AROM;Both;5 reps;Supine Straight Leg Raises: AROM;Both;5 reps Low Level/ICU Exercises Quad Sets: AROM;Both;5 reps;Supine Hip ABduction/ADduction: AROM;Both;5 reps;Supine Elbow Flexion: AROM;Both;5 reps;Supine    General Comments General comments (skin integrity, edema, etc.): Pt wife present. 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 : Unable Difficulty sitting down on and standing up from a chair with arms (e.g., wheelchair, bedside commode, etc,.)?: Unable Help needed moving to and from a bed to chair (including a wheelchair)?: Total Help needed walking  in hospital room?: Total Help needed climbing 3-5 steps with a railing? : Total 6 Click Score:  6    End of Session Equipment Utilized During Treatment: Gait belt Activity Tolerance: Patient limited by pain Patient left: in chair;with call bell/phone within reach;with chair alarm set;with nursing/sitter in room Nurse Communication: Mobility status PT Visit Diagnosis: Unsteadiness on feet (R26.81);Muscle weakness (generalized) (M62.81);Other abnormalities of gait and mobility (R26.89);Other symptoms and signs involving the nervous system (B22.567)     Time: 2091-9802 PT Time Calculation (min) (ACUTE ONLY): 15 min  Charges:  $Therapeutic Exercise: 8-22 mins                     Yue Glasheen B. Migdalia Dk PT, DPT Acute Rehabilitation  [phone] Pager [phone]     [place] 06/11/2018, 4:37 PM

## 2018-06-11 NOTE — Progress Notes (Addendum)
Nutrition Follow-up  DOCUMENTATION CODES:   Severe malnutrition in context of social or environmental circumstances, Underweight  INTERVENTION:    Nepro Shake po once daily, each supplement provides 425 kcal and 19 grams protein   Boost Breeze po BID, each supplement provides 250 kcal and 9 grams of protein   Magic cup TID with meals, each supplement provides 290 kcal and 9 grams of protein   Recommend resume nocturnal TF via Cortrak tube to help meet nutrition needs. Nepro at 75 ml/h x 12 hours (6pm-6am) would provide ~70% of estimated needs (1620 kcal, 73 gm protein, 654 ml free water daily).  NUTRITION DIAGNOSIS:   Severe Malnutrition related to social / environmental circumstances(polysubstance abuse) as evidenced by severe fat depletion, severe muscle depletion, energy intake < or equal to 50% for > or equal to 1 month.  Ongoing  GOAL:   Patient will meet greater than or equal to 90% of their needs  Unmet  MONITOR:   PO intake, Supplement acceptance, Labs   ASSESSMENT:   Patient with PMH significant for CAD with prior MI, HTN, cocaine abuse, and myasthenia gravis s/p thymectomy. Presents this admission with after being found next to his mailbox unresponsive, by stander CPR was initiated. Admitted for cardiac arrest presumed in the setting of ST elevation MI and 100% LAD lesion.   Patient had several black liquid stools overnight. Hgb trending down. GI following. Plans for CT abd vs nuclear tagged RBC scan. Suspected small bowel source of bleeding.  Per discussion with patient's family member in room and RN, he has not eaten very much since diet was advanced to dysphagia 2 with thin liquids yesterday. Family member reports that patient has lost 75 lbs in the past year.   Weight down 15 kg since admission.  I/O -595 ml since admission. Oliguric. Received HD today.  TF has been off since diet advanced yesterday. Cortrak tube still in place with tip in duodenum  (per Cortrak monitor when initially placed).  Patient has had minimal intake since admission. Even with TF, patient was not meeting 100% of estimated nutrition needs. Intake PTA was also suboptimal. Suspect intake has been meeting < 50% of estimated needs for at least the past month.   Labs reviewed. Potassium 5 (WNL), phosphorus 7.8 (H), sodium 136 (WNL) Medications reviewed and include Rena-vit, Mestinon, Renvela.  NUTRITION - FOCUSED PHYSICAL EXAM:    Most Recent Value  Orbital Region  Severe depletion  Upper Arm Region  Severe depletion  Thoracic and Lumbar Region  Moderate depletion  Buccal Region  Severe depletion  Temple Region  Severe depletion  Clavicle Bone Region  Severe depletion  Clavicle and Acromion Bone Region  Severe depletion  Scapular Bone Region  Severe depletion  Dorsal Hand  Moderate depletion  Patellar Region  Moderate depletion  Anterior Thigh Region  Unable to assess  Posterior Calf Region  Moderate depletion  Edema (RD Assessment)  None  Hair  Reviewed  Eyes  Reviewed  Mouth  Reviewed  Skin  Reviewed  Nails  Reviewed       Diet Order:   Diet Order            Diet renal/carb modified with fluid restriction Diet-HS Snack? Nothing; Fluid restriction: 1200 mL Fluid; Room service appropriate? Yes; Fluid consistency: Thin  Diet effective now              EDUCATION NEEDS:   Not appropriate for education at this time  Skin:  Skin Assessment:  Reviewed RN Assessment  Last BM:  8/16 (type 7)  Height:   Ht Readings from Last 1 Encounters:  06/10/18 5\' 10"  (1.778 m)    Weight:   Wt Readings from Last 1 Encounters:  06/11/18 53.1 kg    Ideal Body Weight:  75.5 kg  BMI:  Body mass index is 16.8 kg/m.  Estimated Nutritional Needs:   Kcal:  2200-2400  Protein:  100-125 gm  Fluid:  1 L    Molli Barrows, RD, LDN, [place] Pager [phone] After Hours Pager [phone]

## 2018-06-11 NOTE — Care Management Note (Signed)
Case Management Note  Patient Details  Name: Salil Raineri MRN: [id number] Date of Birth: 10-May-1966  Subjective/Objective:      Pt admitted post cardiac arrest - now with AKI requiring IHD           Action/Plan:  PTA from home.  CSW consulted for current abuse.    Expected Discharge Date:                  Expected Discharge Plan:  IP Rehab Facility(SNF as back up )  In-House Referral:  Clinical Social Work  Discharge planning Services  CM Consult  Post Acute Care Choice:    Choice offered to:     DME Arranged:    DME Agency:     HH Arranged:    [place] Agency:     Status of Service:  In process, will continue to follow  If discussed at Long Length of Stay Meetings, dates discussed:    Additional Comments: 06/11/2018  Pt is now off precedex.  CIR and CSW following for placement.  Renal status has not yet been declared - renal following for HD needs   06/08/18 Pt continues to require Precedex.  CM will continue to follow Maryclare Labrador, RN 06/11/2018, 9:39 AM

## 2018-06-12 DIAGNOSIS — E43 Unspecified severe protein-calorie malnutrition: Secondary | ICD-10-CM

## 2018-06-12 LAB — TYPE AND SCREEN
ABO/RH(D): O POS
ANTIBODY SCREEN: NEGATIVE
Unit division: 0

## 2018-06-12 LAB — GLUCOSE, CAPILLARY
GLUCOSE-CAPILLARY: 121 mg/dL — AB (ref 70–99)
GLUCOSE-CAPILLARY: 123 mg/dL — AB (ref 70–99)
GLUCOSE-CAPILLARY: 124 mg/dL — AB (ref 70–99)
GLUCOSE-CAPILLARY: 123 mg/dL — AB (ref 70–99)

## 2018-06-12 LAB — RENAL FUNCTION PANEL
ANION GAP: 15 (ref 5–15)
Albumin: 2.7 g/dL — ABNORMAL LOW (ref 3.5–5.0)
BUN: 49 mg/dL — ABNORMAL HIGH (ref 6–20)
CALCIUM: 8.8 mg/dL — AB (ref 8.9–10.3)
CO2: 23 mmol/L (ref 22–32)
CREATININE: 6.53 mg/dL — AB (ref 0.61–1.24)
Chloride: 98 mmol/L (ref 98–111)
GFR, EST AFRICAN AMERICAN: 10 mL/min — AB (ref >60)
GFR, EST NON AFRICAN AMERICAN: 9 mL/min — AB (ref >60)
Glucose, Bld: 131 mg/dL — ABNORMAL HIGH (ref 70–99)
Phosphorus: 10.5 mg/dL — ABNORMAL HIGH (ref 2.5–4.6)
Potassium: 3.9 mmol/L (ref 3.5–5.1)
SODIUM: 136 mmol/L (ref 135–145)

## 2018-06-12 LAB — CBC
HEMATOCRIT: 35.3 % — AB (ref 39.0–52.0)
Hemoglobin: 10.9 g/dL — ABNORMAL LOW (ref 13.0–17.0)
MCH: 28.7 pg (ref 26.0–34.0)
MCHC: 30.9 g/dL (ref 30.0–36.0)
MCV: 92.9 fL (ref 78.0–100.0)
PLATELETS: 146 10*3/uL — AB (ref 150–400)
RBC: 3.8 MIL/uL — ABNORMAL LOW (ref 4.22–5.81)
RDW: 20 % — AB (ref 11.5–15.5)
WBC: 9.2 10*3/uL (ref 4.0–10.5)

## 2018-06-12 LAB — BPAM RBC
Blood Product Expiration Date: 201909092359
ISSUE DATE / TIME: 201908160552
UNIT TYPE AND RH: 5100

## 2018-06-12 MED ORDER — NEBIVOLOL HCL 5 MG PO TABS
5.0000 mg | ORAL_TABLET | Freq: Every day | ORAL | Status: DC
  Administered 2018-06-13 – 2018-06-14 (×3): 5 mg via ORAL
  Filled 2018-06-12 (×4): qty 1

## 2018-06-12 MED ORDER — ONDANSETRON HCL 4 MG/2ML IJ SOLN
4.0000 mg | Freq: Four times a day (QID) | INTRAMUSCULAR | Status: DC | PRN
  Administered 2018-06-12 – 2018-06-17 (×5): 4 mg via INTRAVENOUS
  Filled 2018-06-12 (×6): qty 2

## 2018-06-12 MED ORDER — DEXTROSE-NACL 5-0.9 % IV SOLN
INTRAVENOUS | Status: DC
  Administered 2018-06-12: 23:00:00 via INTRAVENOUS

## 2018-06-12 NOTE — Progress Notes (Addendum)
Subjective: Interval History: States he is ready to get out of bed when he can, otherwas has no complaints.  Objective: Vital signs in last 24 hours: Temp:  [97.1 F (36.2 C)-98.1 F (36.7 C)] 97.8 F (36.6 C) (08/17 0330) Pulse Rate:  [64-89] 66 (08/17 0300) Resp:  [14-22] 14 (08/17 0300) BP: (102-173)/(68-107) 116/73 (08/17 0300) SpO2:  [93 %-100 %] 98 % (08/17 0300) Weight:  [53.1 kg-56.6 kg] 53.1 kg (08/16 1146) Weight change: -0.4 kg  Intake/Output from previous day: 08/16 0701 - 08/17 0700 In: 343 [P.O.:15; I.V.:13; Blood:315] Out: 4000  Intake/Output this shift: No intake/output data recorded.  Physical Exam  Constitutional: He appears well-developed and well-nourished. No distress.  Cardiovascular: Normal rate and regular rhythm.  Systolic murmer  Pulmonary/Chest: Effort normal and breath sounds normal. No respiratory distress.  Abdominal: Soft. Bowel sounds are normal. He exhibits no distension. There is no tenderness.  Musculoskeletal: He exhibits no edema or deformity.  Neurological: He is alert.  Oriented to person and city, not building or year  Skin: Skin is warm and dry.   Lab Results: Recent Labs    06/11/18 0244 06/11/18 1433  WBC 7.4 9.2  HGB 6.5* 9.2*  HCT 20.8* 28.6*  PLT 140* 94*   BMET:  Recent Labs    06/10/18 0230 06/11/18 0244  NA 136 136  K 4.6 5.0  CL 97* 98  CO2 27 21*  GLUCOSE 100* 96  BUN 84* 128*  CREATININE 5.88* 8.49*  CALCIUM 7.7* 8.0*   No results for input(s): PTH in the last 72 hours. Iron Studies: No results for input(s): IRON, TIBC, TRANSFERRIN, FERRITIN in the last 72 hours. CBG (last 3)  Recent Labs    06/11/18 1932 06/11/18 2336 06/12/18 0329  GLUCAP 98 114* 121*     Studies/Results: No results found.  I have reviewed the patient's current medications.  Assessment/Plan: AKI: Anuric, 2.2 Ischemic and toxic ATN. He remains on Intermittent HD (last session 8/16). No recovery of renal function for ~3  weeks. Will need permanent access next week if no improvement. Phos 10.5 on am labs  - Continue iHD, ESA, Sevelamer   HTN: Normotensive todayon  meds , will decrease soon.  Anemia: GIB, etiology unclear (EGD 8/15 showd duodenitis, no bleed), Hgb stable today at 9.2 post transfusion yesterday - Treatment per primary Renal and bleed, on esa, Fe  CAD & ICM: Per primary and cardiology Confusion: Improving A&Ox2 MG: Per primary team Substance abuse CAD   LOS: 21 days   Neva Seat 06/12/2018,5:51 AM  I have seen and examined this patient and agree with the plan of care seen, examined, eval, discussed with resident, staff. Changes made .  Kristian Hazzard 06/12/2018, 11:03 AM

## 2018-06-12 NOTE — Progress Notes (Signed)
Pt. states, "mestinon gives me cramps and I don't want to take it". Dr. Thereasa Solo notified.

## 2018-06-12 NOTE — Progress Notes (Signed)
[facility] TEAM 1 - Stepdown/ICU TEAM  Danie Diehl  MBW:[id number] DOB: 09/25/1966 DOA: 05/22/2018 PCP: No primary care provider on file.    Brief Narrative:  52yo w/ a hx of myasthenia gravis, tobacco abuse, alcohol abuse, and cocaine abuse who had an out of hospital arrest on 05/22/2018.  Since his admission he has undergone cardiac cath with LAD stenting. He has suffered blood loss anemia and required transfusions. He was transferred to the intensive care unit on 06/05/2018 for use of Precedex w/ severe agitation.    Significant Events: 7/27 admit after out of hospital arrest 7/27 cath - stent to LAD 7/28 TTE EF 25-30% 8/10 back to ICU for agitation > precedex  8/14 TTE EF 45% 8/15 precedex stopped  8/15 EGD - mild duodenitis w/o ulceration  Subjective: The pt is alert but confused.  He is not aware that he is on HD, and does not know why he is in the hospital.  He has been eating essentially nothing.  He denies sob, but reports chest pain w/ movement or cough (due to CPR).  He is having some watery dark green stools.    Assessment & Plan:  Out-of-hospital cardiac arrest - VFib - ACS w/ 100% LAD lesion Status post cardiac catheterization 05/22/2018 with stent LAD - on ASA and Brilinta for 12 months - initial EF 25-30% 7/28 > EF 45% via TTE 06/09/18 - Cards following   Anoxic encephalopathy - Agitation  Off precedex since 8/15 - avoid sedatives as able - is outside window for withdrawal - B12 and folate not low   Acute renal failure with anuria Due to ischemic and toxic ATN - cont HD MWF as per Nephrology - now ~3 weeks w/ no sign of recovery - plan for permanent access placement next week   Tobacco abuse Cont to counsel on need for complete abstinence  Suspected COPD Will benefit from formal PFTs as outpt   Left CFA dilation 1.9cm on CT 06/06/18 Noted incidentally on CTabdom   Malnourished W/ very limited oral intake will continue Cortrak feeding for now    Dysphagia cleared for D2 on 06/10/18 - cont to encourage intake - if intake improves will transition tube feeding to night time only   Multifactorial Anemia Keep Hgb 7.0 or > - recheck this AM   Possible slow GI Bleed / Melena EGD 06/10/18 with duondenitis only (not source of bleeding) - small bowel source suspected - GI suggests CTa abdom w/ contrast if cleared by Nephrology, or in lieu of this a NM bleeding scan   Bleeding episode from placement of tunneled HD catheter R IJ (on DAPT) No RP bleed on CT 06/06/18   Polysubstance abuse   Pt was using cocaine in the week leading up to his arrest per family - UDS confirms this   Myasthenia gravis Mestinon per Neurology - s/p thymus resection   DVT prophylaxis: SCDs Code Status: FULL CODE Family Communication: no family present at time of exam  Disposition Plan: SDU  Consultants:  PCCM Cardiology Nephrology [place] [facility]   Antimicrobials:  None presently   Objective: Blood pressure 127/70, pulse 68, temperature 98.5 F (36.9 C), temperature source Oral, resp. rate 15, height 5\' 10"  (1.778 m), weight 53.1 kg, SpO2 98 %.  Intake/Output Summary (Last 24 hours) at 06/12/2018 0845 Last data filed at 06/11/2018 2000 Gross per 24 hour  Intake 168 ml  Output 4000 ml  Net -3832 ml   Filed Weights   06/11/18 0500 06/11/18  0710 06/11/18 1146  Weight: 56 kg 56.6 kg 53.1 kg    Examination: General: No acute respiratory distress - cachectic  Lungs: Clear to auscultation bilaterally without wheezes or crackles Cardiovascular: Regular rate and rhythm without murmur gallop or rub normal S1 and S2 Abdomen: Nontender, nondistended, soft, bowel sounds positive, no rebound, no ascites, no appreciable mass Extremities: No significant cyanosis, clubbing, or edema bilateral lower extremities  CBC: Recent Labs  Lab 06/09/18 0339 06/10/18 0230 06/11/18 0244 06/11/18 1433  WBC 8.8 6.6 7.4 9.2  NEUTROABS 7.0 4.7 5.7  --   HGB  7.0* 7.1* 6.5* 9.2*  HCT 21.8* 21.7* 20.8* 28.6*  MCV 87.9 90.0 91.6 90.2  PLT 224 120* 140* 94*   Basic Metabolic Panel: Recent Labs  Lab 06/08/18 0416 06/09/18 0339 06/10/18 0230 06/11/18 0244  NA 136 136 136 136  K 4.8 5.1 4.6 5.0  CL 97* 97* 97* 98  CO2 27 23 27  21*  GLUCOSE 103* 113* 100* 96  BUN 77* 140* 84* 128*  CREATININE 6.39* 8.96* 5.88* 8.49*  CALCIUM 7.9* 8.1* 7.7* 8.0*  MG 2.5* 2.9* 2.4 2.7*  PHOS 6.0* 7.1*  --  7.8*   GFR: Estimated Creatinine Clearance: 7.7 mL/min (A) (by C-G formula based on SCr of 8.49 mg/dL (H)).  Liver Function Tests: Recent Labs  Lab 06/07/18 0334  06/08/18 0416 06/09/18 0339 06/10/18 0230 06/11/18 0244  AST 39  --  35 35 36  --   ALT 12  --  12 13 16   --   ALKPHOS 144*  --  121 107 101  --   BILITOT 1.0  --  0.9 0.8 0.7  --   PROT 5.7*  --  5.1* 5.4* 5.1*  --   ALBUMIN 2.3*   < > 1.9*  1.9* 2.0*  2.0* 2.0* 2.1*   < > = values in this interval not displayed.    Cardiac Enzymes: Recent Labs  Lab 06/10/18 1910 06/11/18 0244  TROPONINI 0.06* 0.06*    HbA1C: Hgb A1c MFr Bld  Date/Time Value Ref Range Status  05/22/2018 08:27 PM 5.7 (H) 4.8 - 5.6 % Final    Comment:    (NOTE) Pre diabetes:          5.7%-6.4% Diabetes:              >6.4% Glycemic control for   <7.0% adults with diabetes     CBG: Recent Labs  Lab 06/11/18 1724 06/11/18 1932 06/11/18 2336 06/12/18 0329 06/12/18 0736  GLUCAP 88 98 114* 121* 123*    Recent Results (from the past 240 hour(s))  Culture, blood (Routine X 2) w Reflex to ID Panel     Status: None   Collection Time: 06/04/18 12:44 PM  Result Value Ref Range Status   Specimen Description BLOOD RIGHT FOREARM  Final   Special Requests   Final    BOTTLES DRAWN AEROBIC AND ANAEROBIC Blood Culture results may not be optimal due to an excessive volume of blood received in culture bottles   Culture   Final    NO GROWTH 5 DAYS Performed at [facility], [place] [address],  [place], [place] [address]    Report Status 06/09/2018 FINAL  Final  Culture, blood (Routine X 2) w Reflex to ID Panel     Status: None   Collection Time: 06/04/18  5:00 PM  Result Value Ref Range Status   Specimen Description BLOOD HEMODIALYSIS CATHETER  Final   Special Requests  Final    BOTTLES DRAWN AEROBIC AND ANAEROBIC Blood Culture adequate volume   Culture   Final    NO GROWTH 5 DAYS Performed at [facility], [place] [address] [place], [place] [address]    Report Status 06/09/2018 FINAL  Final     Scheduled Meds: . sodium chloride   Intravenous Once  . sodium chloride   Intravenous Once  . aspirin  81 mg Oral Daily  . atorvastatin  40 mg Oral q1800  . chlorhexidine  15 mL Mouth Rinse BID  . Chlorhexidine Gluconate Cloth  6 each Topical Q0600  . Chlorhexidine Gluconate Cloth  6 each Topical Q0600  . Chlorhexidine Gluconate Cloth  6 each Topical Q0600  . darbepoetin (ARANESP) injection - DIALYSIS  200 mcg Intravenous Q Fri-HD  . dicyclomine  10 mg Oral TID AC & HS  . feeding supplement (NEPRO CARB STEADY)  1,000 mL Per Tube Q24H  . heparin  40 Units/kg Dialysis Once in dialysis  . insulin aspart  0-9 Units Subcutaneous Q4H  . LORazepam  0.5 mg Oral TID  . mouth rinse  15 mL Mouth Rinse q12n4p  . multivitamin  1 tablet Oral QHS  . nebivolol  5 mg Oral QHS  . pantoprazole  40 mg Oral BID  . pyridostigmine  60 mg Oral Q8H  . QUEtiapine  50 mg Oral BID  . sevelamer carbonate  2.4 g Per Tube TID WC  . sodium chloride flush  3 mL Intravenous Q12H  . ticagrelor  90 mg Oral BID     LOS: 21 days   Cherene Altes, MD [facility] Office  [phone] Pager - Text Page per Shea Evans  If 7PM-7AM, please contact night-coverage per Amion 06/12/2018, 8:45 AM

## 2018-06-13 ENCOUNTER — Encounter (HOSPITAL_COMMUNITY): Payer: Self-pay | Admitting: Gastroenterology

## 2018-06-13 LAB — CBC
HCT: 32 % — ABNORMAL LOW (ref 39.0–52.0)
Hemoglobin: 10.2 g/dL — ABNORMAL LOW (ref 13.0–17.0)
MCH: 29.3 pg (ref 26.0–34.0)
MCHC: 31.9 g/dL (ref 30.0–36.0)
MCV: 92 fL (ref 78.0–100.0)
PLATELETS: 148 10*3/uL — AB (ref 150–400)
RBC: 3.48 MIL/uL — ABNORMAL LOW (ref 4.22–5.81)
RDW: 19.5 % — AB (ref 11.5–15.5)
WBC: 9 10*3/uL (ref 4.0–10.5)

## 2018-06-13 LAB — GLUCOSE, CAPILLARY
GLUCOSE-CAPILLARY: 95 mg/dL (ref 70–99)
Glucose-Capillary: 117 mg/dL — ABNORMAL HIGH (ref 70–99)
Glucose-Capillary: 121 mg/dL — ABNORMAL HIGH (ref 70–99)
Glucose-Capillary: 126 mg/dL — ABNORMAL HIGH (ref 70–99)
Glucose-Capillary: 97 mg/dL (ref 70–99)
Glucose-Capillary: 124 mg/dL — ABNORMAL HIGH (ref 70–99)
Glucose-Capillary: 101 mg/dL — ABNORMAL HIGH (ref 70–99)

## 2018-06-13 LAB — COMPREHENSIVE METABOLIC PANEL
ALT: 14 U/L (ref 0–44)
AST: 22 U/L (ref 15–41)
Albumin: 2.5 g/dL — ABNORMAL LOW (ref 3.5–5.0)
Alkaline Phosphatase: 137 U/L — ABNORMAL HIGH (ref 38–126)
Anion gap: 16 — ABNORMAL HIGH (ref 5–15)
BUN: 69 mg/dL — AB (ref 6–20)
CHLORIDE: 98 mmol/L (ref 98–111)
CO2: 22 mmol/L (ref 22–32)
CREATININE: 8.55 mg/dL — AB (ref 0.61–1.24)
Calcium: 8.9 mg/dL (ref 8.9–10.3)
GFR calc Af Amer: 7 mL/min — ABNORMAL LOW (ref >60)
GFR calc non Af Amer: 6 mL/min — ABNORMAL LOW (ref >60)
Glucose, Bld: 118 mg/dL — ABNORMAL HIGH (ref 70–99)
Potassium: 4.2 mmol/L (ref 3.5–5.1)
SODIUM: 136 mmol/L (ref 135–145)
Total Bilirubin: 0.7 mg/dL (ref 0.3–1.2)
Total Protein: 6.5 g/dL (ref 6.5–8.1)

## 2018-06-13 LAB — PHOSPHORUS: Phosphorus: 10.9 mg/dL — ABNORMAL HIGH (ref 2.5–4.6)

## 2018-06-13 LAB — MAGNESIUM: Magnesium: 2.8 mg/dL — ABNORMAL HIGH (ref 1.7–2.4)

## 2018-06-13 MED ORDER — CHLORHEXIDINE GLUCONATE CLOTH 2 % EX PADS
6.0000 | MEDICATED_PAD | Freq: Every day | CUTANEOUS | Status: DC
  Administered 2018-06-13 – 2018-06-15 (×2): 6 via TOPICAL

## 2018-06-13 MED ORDER — SEVELAMER CARBONATE 2.4 G PO PACK
4.8000 g | PACK | Freq: Three times a day (TID) | ORAL | Status: DC
  Administered 2018-06-13 (×2): 4.8 g
  Filled 2018-06-13 (×5): qty 2

## 2018-06-13 NOTE — Progress Notes (Signed)
Subjective: Interval History: has no complaint , says making urine  Objective: Vital signs in last 24 hours: Temp:  [97.6 F (36.4 C)-98.5 F (36.9 C)] 97.9 F (36.6 C) (08/18 0440) Pulse Rate:  [66-83] 75 (08/18 0440) Resp:  [13-20] 20 (08/18 0440) BP: (116-156)/(76-121) 137/77 (08/18 0440) SpO2:  [94 %-100 %] 100 % (08/18 0440) Weight:  [53.2 kg] 53.2 kg (08/18 0500) Weight change: -3.4 kg  Intake/Output from previous day: 08/17 0701 - 08/18 0700 In: 1177.9 [P.O.:820; I.V.:357.9] Out: -  Intake/Output this shift: No intake/output data recorded.  General appearance: alert, cooperative, cachectic, no distress and pale Resp: clear to auscultation bilaterally Chest wall: RIJ cath Cardio: S1, S2 normal and systolic murmur: systolic ejection 2/6, crescendo and decrescendo at 2nd left intercostal space GI: soft, non-tender; bowel sounds normal; no masses,  no organomegaly Extremities: extremities normal, atraumatic, no cyanosis or edema  Lab Results: Recent Labs    06/12/18 0718 06/13/18 0155  WBC 9.2 9.0  HGB 10.9* 10.2*  HCT 35.3* 32.0*  PLT 146* 148*   BMET:  Recent Labs    06/12/18 0718 06/13/18 0155  NA 136 136  K 3.9 4.2  CL 98 98  CO2 23 22  GLUCOSE 131* 118*  BUN 49* 69*  CREATININE 6.53* 8.55*  CALCIUM 8.8* 8.9   No results for input(s): PTH in the last 72 hours. Iron Studies: No results for input(s): IRON, TIBC, TRANSFERRIN, FERRITIN in the last 72 hours.  Studies/Results: No results found.  I have reviewed the patient's current medications.  Assessment/Plan: 1 AKI ? Starting to make urine.  No appreciable GFR by chem.  Will plan HD in am and will need access soon. 2 MI  Per cards 3 CAD 4 substance abuse 5 Arrest 6 GIB BUN ^ from this 7 ^PHos ^ binders P HD Mon, ^ binders, follow urine, chem    LOS: 22 days   Jeneen Rinks Dwanda Tufano 06/13/2018,7:21 AM

## 2018-06-13 NOTE — Progress Notes (Signed)
[place] TEAM 1 - Stepdown/ICU TEAM  Howard Thompson  NGE:[id number] DOB: 20-Jul-1966 DOA: 05/22/2018 PCP: No primary care provider on file.    Brief Narrative:  52yo w/ a hx of myasthenia gravis, tobacco abuse, alcohol abuse, and cocaine abuse who had an out of hospital arrest on 05/22/2018.  Since his admission he has undergone cardiac cath with LAD stenting. He has suffered blood loss anemia and required transfusions. He was transferred to the intensive care unit on 06/05/2018 for use of Precedex w/ severe agitation.    Significant Events: 7/27 admit after out of hospital arrest 7/27 cath - stent to LAD 7/28 TTE EF 25-30% 8/10 back to ICU for agitation > precedex  8/14 TTE EF 45% 8/15 precedex stopped  8/15 EGD - mild duodenitis w/o ulceration  Subjective: The pt has been refusing to accept tube feeding.  He is resting comfortably in bed.  He tells me that his Cortrak feeding tube causes significant discomfort and nausea and makes him not want to eat.  He has agreed to force himself to eat each and every meal that is brought to him if I will remove the feeding tube today.  He has been cleared for diet by SLP.  He denies substernal chest pressure abdominal pain or shortness of breath.  He continues to have musculoskeletal type chest wall pain related to CPR.  Assessment & Plan:  Out-of-hospital cardiac arrest - VFib - ACS w/ 100% LAD lesion Status post cardiac catheterization 05/22/2018 with stent LAD - on ASA and Brilinta for 12 months - initial EF 25-30% 7/28 > EF 45% via TTE 06/09/18 - Cards following   Anoxic encephalopathy - Agitation  Off precedex since 8/15 - avoid sedatives as able - is outside window for withdrawal - B12 and folate not low - appears to be improving steadily, but short term memory still appears quite impaired   Acute renal failure with anuria Due to ischemic and toxic ATN - cont HD MWF as per Nephrology - now ~3 weeks w/ no sign of recovery - plan for permanent  access placement next week   Tobacco abuse Cont to counsel on need for complete abstinence  Suspected COPD Will benefit from formal PFTs as outpt   Left CFA dilation 1.9cm on CT 06/06/18 Noted incidentally on CTabdom   Malnourished W/ very limited oral intake - see discussion above - removing Cortrak today, w/ pt warned that if he does not eat sufficiently it will have to be put back in soon   Dysphagia cleared for D2 on 06/10/18 - cont to encourage intake   Multifactorial Anemia Keep Hgb 7.0 or > - much improved presently   Recent Labs  Lab 06/10/18 0230 06/11/18 0244 06/11/18 1433 06/12/18 0718 06/13/18 0155  HGB 7.1* 6.5* 9.2* 10.9* 10.2*    Possible slow GI Bleed / Melena EGD 06/10/18 with duondenitis only (not source of bleeding) - small bowel source suspected - GI suggests CTa abdom w/ contrast if cleared by Nephrology, or in lieu of this a NM bleeding scan - appears to have stopped for now so will not pursue either   Bleeding episode from placement of tunneled HD catheter R IJ (on DAPT) No RP bleed on CT 06/06/18   Polysubstance abuse   Pt was using cocaine in the week leading up to his arrest per family - UDS confirms this   Myasthenia gravis Mestinon per Neurology - s/p thymus resection - pt has been refusing to take his mestinon  DVT prophylaxis: SCDs Code Status: FULL CODE Family Communication: Spoke with wife at bedside at length Disposition Plan: SDU  Consultants:  PCCM Cardiology Nephrology [place] [facility] Neu[facility]   Antimicrobials:  None presently   Objective: Blood pressure 119/67, pulse 65, temperature 98.1 F (36.7 C), temperature source Oral, resp. rate 16, height 5\' 10"  (1.778 m), weight 53.2 kg, SpO2 98 %.  Intake/Output Summary (Last 24 hours) at 06/13/2018 1157 Last data filed at 06/13/2018 1100 Gross per 24 hour  Intake 1397.87 ml  Output 0 ml  Net 1397.87 ml   Filed Weights   06/11/18 0710 06/11/18 1146 06/13/18 0500  Weight: 56.6  kg 53.1 kg 53.2 kg    Examination: General: No acute respiratory distress - cachectic - animated Lungs: Clear to auscultation bilaterally- no wheezing Cardiovascular: Regular rate and rhythm without murmur  Abdomen: Nontender, nondistended, soft, bowel sounds positive, no rebound Extremities: No significant edema bilateral lower extremities  CBC: Recent Labs  Lab 06/09/18 0339 06/10/18 0230 06/11/18 0244 06/11/18 1433 06/12/18 0718 06/13/18 0155  WBC 8.8 6.6 7.4 9.2 9.2 9.0  NEUTROABS 7.0 4.7 5.7  --   --   --   HGB 7.0* 7.1* 6.5* 9.2* 10.9* 10.2*  HCT 21.8* 21.7* 20.8* 28.6* 35.3* 32.0*  MCV 87.9 90.0 91.6 90.2 92.9 92.0  PLT 224 120* 140* 94* 146* 867*   Basic Metabolic Panel: Recent Labs  Lab 06/10/18 0230 06/11/18 0244 06/12/18 0718 06/13/18 0155  NA 136 136 136 136  K 4.6 5.0 3.9 4.2  CL 97* 98 98 98  CO2 27 21* 23 22  GLUCOSE 100* 96 131* 118*  BUN 84* 128* 49* 69*  CREATININE 5.88* 8.49* 6.53* 8.55*  CALCIUM 7.7* 8.0* 8.8* 8.9  MG 2.4 2.7*  --  2.8*  PHOS  --  7.8* 10.5* 10.9*   GFR: Estimated Creatinine Clearance: 7.7 mL/min (A) (by C-G formula based on SCr of 8.55 mg/dL (H)).  Liver Function Tests: Recent Labs  Lab 06/08/18 0416 06/09/18 0339 06/10/18 0230 06/11/18 0244 06/12/18 0718 06/13/18 0155  AST 35 35 36  --   --  22  ALT 12 13 16   --   --  14  ALKPHOS 121 107 101  --   --  137*  BILITOT 0.9 0.8 0.7  --   --  0.7  PROT 5.1* 5.4* 5.1*  --   --  6.5  ALBUMIN 1.9*  1.9* 2.0*  2.0* 2.0* 2.1* 2.7* 2.5*    Cardiac Enzymes: Recent Labs  Lab 06/10/18 1910 06/11/18 0244  TROPONINI 0.06* 0.06*    HbA1C: Hgb A1c MFr Bld  Date/Time Value Ref Range Status  05/22/2018 08:27 PM 5.7 (H) 4.8 - 5.6 % Final    Comment:    (NOTE) Pre diabetes:          5.7%-6.4% Diabetes:              >6.4% Glycemic control for   <7.0% adults with diabetes     CBG: Recent Labs  Lab 06/12/18 1551 06/12/18 2044 06/12/18 2332 06/13/18 0438  06/13/18 0821  GLUCAP 123* 117* 126* 121* 97    Recent Results (from the past 240 hour(s))  Culture, blood (Routine X 2) w Reflex to ID Panel     Status: None   Collection Time: 06/04/18 12:44 PM  Result Value Ref Range Status   Specimen Description BLOOD RIGHT FOREARM  Final   Special Requests   Final    BOTTLES DRAWN AEROBIC AND  ANAEROBIC Blood Culture results may not be optimal due to an excessive volume of blood received in culture bottles   Culture   Final    NO GROWTH 5 DAYS Performed at [facility] [address], [place], [place] [address]    Report Status 06/09/2018 FINAL  Final  Culture, blood (Routine X 2) w Reflex to ID Panel     Status: None   Collection Time: 06/04/18  5:00 PM  Result Value Ref Range Status   Specimen Description BLOOD HEMODIALYSIS CATHETER  Final   Special Requests   Final    BOTTLES DRAWN AEROBIC AND ANAEROBIC Blood Culture adequate volume   Culture   Final    NO GROWTH 5 DAYS Performed at [facility], [place] [address], [place], [place] [address]    Report Status 06/09/2018 FINAL  Final     Scheduled Meds: . aspirin  81 mg Oral Daily  . atorvastatin  40 mg Oral q1800  . chlorhexidine  15 mL Mouth Rinse BID  . Chlorhexidine Gluconate Cloth  6 each Topical Q0600  . darbepoetin (ARANESP) injection - DIALYSIS  200 mcg Intravenous Q Fri-HD  . dicyclomine  10 mg Oral TID AC & HS  . feeding supplement (NEPRO CARB STEADY)  1,000 mL Per Tube Q24H  . insulin aspart  0-9 Units Subcutaneous Q4H  . mouth rinse  15 mL Mouth Rinse q12n4p  . multivitamin  1 tablet Oral QHS  . nebivolol  5 mg Oral QHS  . pantoprazole  40 mg Oral BID  . pyridostigmine  60 mg Oral Q8H  . QUEtiapine  50 mg Oral BID  . sevelamer carbonate  4.8 g Per Tube TID WC  . sodium chloride flush  3 mL Intravenous Q12H  . ticagrelor  90 mg Oral BID     LOS: 22 days   Cherene Altes, MD [facility] Office  [phone] Pager - Text Page per Shea Evans  If  7PM-7AM, please contact night-coverage per Amion 06/13/2018, 11:57 AM

## 2018-06-13 NOTE — Progress Notes (Addendum)
Progress Note  Patient Name: Howard Thompson Date of Encounter: 06/13/2018  Primary Cardiologist: No primary care provider on file.     Patient Profile     52 y.o. male with anout of hospital cardiac arrest ( found down bystander CPR) 7/27 in the context of acute LAD occlusion>> LAD stent  cx by 1)respiratory arrest w prolonged intubation, Extubated on 8/2 2) AKI requiring CRT 3) shock liver 4) anoxic encephalopathy cx by delerium 5) thrombocytopenia  6) GI bleeding     prior history of MI several years ago, myasthenia gravis post thymus resection  Cocaine and EToh abuse  . Echo EF 06/09/18>> 40-45; possible bicuspid valve   Subjective   Alert and without chest pain no sob  Inpatient Medications    Scheduled Meds: . aspirin  81 mg Oral Daily  . atorvastatin  40 mg Oral q1800  . chlorhexidine  15 mL Mouth Rinse BID  . Chlorhexidine Gluconate Cloth  6 each Topical Q0600  . darbepoetin (ARANESP) injection - DIALYSIS  200 mcg Intravenous Q Fri-HD  . dicyclomine  10 mg Oral TID AC & HS  . feeding supplement (NEPRO CARB STEADY)  1,000 mL Per Tube Q24H  . insulin aspart  0-9 Units Subcutaneous Q4H  . LORazepam  0.5 mg Oral TID  . mouth rinse  15 mL Mouth Rinse q12n4p  . multivitamin  1 tablet Oral QHS  . nebivolol  5 mg Oral QHS  . pantoprazole  40 mg Oral BID  . pyridostigmine  60 mg Oral Q8H  . QUEtiapine  50 mg Oral BID  . sevelamer carbonate  4.8 g Per Tube TID WC  . sodium chloride flush  3 mL Intravenous Q12H  . ticagrelor  90 mg Oral BID   Continuous Infusions: . sodium chloride 250 mL (06/05/18 2138)  . dexmedetomidine (PRECEDEX) IV infusion Stopped (06/10/18 1429)  . dextrose 5 % and 0.9% NaCl 50 mL/hr at 06/13/18 0600  . ferumoxytol 510 mg (06/07/18 0926)   PRN Meds: sodium chloride, albuterol, docusate, Gerhardt's butt cream, hydrALAZINE, labetalol, lidocaine (PF), lidocaine-prilocaine, LORazepam, nitroGLYCERIN, ondansetron (ZOFRAN) IV, sodium chloride  flush   Vital Signs    Vitals:   06/13/18 0440 06/13/18 0500 06/13/18 0800 06/13/18 0803  BP: 137/77   136/73  Pulse: 75  70 68  Resp: 20  14 16   Temp: 97.9 F (36.6 C)   98 F (36.7 C)  TempSrc: Oral   Oral  SpO2: 100%  100% 99%  Weight:  53.2 kg    Height:        Intake/Output Summary (Last 24 hours) at 06/13/2018 0853 Last data filed at 06/13/2018 6144 Gross per 24 hour  Intake 1177.87 ml  Output 0 ml  Net 1177.87 ml   Filed Weights   06/11/18 0710 06/11/18 1146 06/13/18 0500  Weight: 56.6 kg 53.1 kg 53.2 kg    Telemetry    Sinus Personally reviewed    Physical Exam   Well developed and nourished in no acute distress  Feeding tube in place  HENT normal Neck supple with JVP-flat Clear Regular rate and rhythm, no murmurs or gallops Abd-soft with active BS No Clubbing cyanosis edema Skin-warm and dry A & Oriented  Grossly normal sensory and motor function  .  Labs    Chemistry Recent Labs  Lab 06/09/18 0339 06/10/18 0230 06/11/18 0244 06/12/18 0718 06/13/18 0155  NA 136 136 136 136 136  K 5.1 4.6 5.0 3.9 4.2  CL 97* 97*  98 98 98  CO2 23 27 21* 23 22  GLUCOSE 113* 100* 96 131* 118*  BUN 140* 84* 128* 49* 69*  CREATININE 8.96* 5.88* 8.49* 6.53* 8.55*  CALCIUM 8.1* 7.7* 8.0* 8.8* 8.9  PROT 5.4* 5.1*  --   --  6.5  ALBUMIN 2.0*  2.0* 2.0* 2.1* 2.7* 2.5*  AST 35 36  --   --  22  ALT 13 16  --   --  14  ALKPHOS 107 101  --   --  137*  BILITOT 0.8 0.7  --   --  0.7  GFRNONAA 6* 10* 6* 9* 6*  GFRAA 7* 12* 7* 10* 7*  ANIONGAP 16* 12 17* 15 16*     Hematology Recent Labs  Lab 06/11/18 1433 06/12/18 0718 06/13/18 0155  WBC 9.2 9.2 9.0  RBC 3.17* 3.80* 3.48*  HGB 9.2* 10.9* 10.2*  HCT 28.6* 35.3* 32.0*  MCV 90.2 92.9 92.0  MCH 29.0 28.7 29.3  MCHC 32.2 30.9 31.9  RDW 18.3* 20.0* 19.5*  PLT 94* 146* 148*    Cardiac Enzymes Recent Labs  Lab 06/10/18 1910 06/11/18 0244  TROPONINI 0.06* 0.06*   No results for input(s): TROPIPOC in the  last 168 hours.   BNPNo results for input(s): BNP, PROBNP in the last 168 hours.   DDimer No results for input(s): DDIMER in the last 168 hours.   Lipid Panel     Component Value Date/Time   CHOL 125 05/22/2018 1745   TRIG 59 05/22/2018 2026   HDL 40 (L) 05/22/2018 1745   CHOLHDL 3.1 05/22/2018 1745   VLDL 11 05/22/2018 1745   LDLCALC 74 05/22/2018 1745    Radiology    No results found.  Cardiac Studies   Cath: 05/22/18    Ost LAD to Prox LAD lesion is 100% stenosed.  A drug-eluting stent was successfully placed using a STENT SYNERGY DES 3X20.  Post intervention, there is a 0% residual stenosis.  There is severe left ventricular systolic dysfunction.  LV end diastolic pressure is mildly elevated.  The left ventricular ejection fraction is less than 25% by visual estimate.   TTE: 05/23/18  Study Conclusions  - Left ventricle: The cavity size was normal. Systolic function was severely reduced. The estimated ejection fraction was in the range of 25% to 30%. Severe diffuse hypokinesis with regional variations. There is akinesis of the apicalanterior and apical myocardium. There is akinesis of the midanteroseptal myocardium. There is severe hypokinesis of the inferoseptal myocardium. Features are consistent with a pseudonormal left ventricular filling pattern, with concomitant abnormal relaxation and increased filling pressure (grade 2 diastolic dysfunction). Doppler parameters are consistent with high ventricular filling pressure. - Aortic valve: A bicuspid morphology cannot be excluded; mildly thickened, mildly calcified leaflets. There was mild to moderate regurgitation. Regurgitation pressure half-time: 718 ms. - Mitral valve: There was mild to moderate regurgitation. Valve area by pressure half-time: 2.39 cm^2. - Right ventricle: Systolic function was moderately reduced. - Atrial septum: There was increased thickness of the  septum, consistent with lipomatous hypertrophy. - Tricuspid valve: There was mild regurgitation. - Pulmonary arteries: PA peak pressure: 33 mm Hg (S). - Inferior vena cava: The vessel was dilated. The respirophasic diameter changes were blunted (<50%), consistent with elevated central venous pressure. - Pericardium, extracardiac: A small, free-flowing pericardial effusion was identified circumferential to the heart. The fluid had no internal echoes.There was no evidence of hemodynamic compromise.  Impressions:  - severe LV dysfunction with diffuse HK but  apica/anteroseptal and apical anterior AK. Recommend definity contrast study to rule out apical thrombus.   ------------------------------------------------------------------- 06/09/2018 ECHO Study Conclusions  - Left ventricle: The cavity size was normal. Systolic function was   mildly to moderately reduced. The estimated ejection fraction was   in the range of 40% to 45%. Images were inadequate for LV wall   motion assessment. The study is not technically sufficient to   allow evaluation of LV diastolic function. - Aortic valve: There was mild regurgitation. - Mitral valve: Calcified annulus. There was mild regurgitation. - Atrial septum: There was increased thickness of the septum,   consistent with lipomatous hypertrophy. - Pulmonary arteries: Systolic pressure could not be accurately   estimated.  Recommendations:  Suggest definity contrast study to evaluate wall motion. Complications:  Apical images were extremely painful for the patient due to his broken ribs.   Assessment & Plan    1. Cardiac arrest: Underwent cath with PCI/DES x1 to the pLADon 05/22/18. Planned for DAPT with ASA/Brilinta for 12 months.  -Continue  ASA 81 mg and Brilinta 90 mg twice daily per NG tube as he is not taking p.o. due to delirium.  -in light of h/o cocaine use, lopressor changedto carvedilol 6.25mg  BID, but due to  potential concern by pulmonolgy of lung issues from non-cardioselectivity will consider changing to bystolic for cardioselectivity and with nitric oxide benefit to reduce vasospasm   2. ICM -EF noted at 25-30% on echo  05/23/2018;  Repeat today shows significant improvement with EF 40 - 45%, mild MR -No room for ARB/ACEi given AKF -continue now bystolic   3. Anoxic encephalopathy  Improved but some delerium    -Neurofollowing and on Seroquel/Ativan,     4. Acute renal failure: on RRT   5. HLD: now back on statin; LFTs normal;  Will increase atorvastatin to 40 mg.   5. Polysubstance abuse:+ for cocaine on admission.   6. Acute blood loss anemia felt to be 2/2 to bleeding HD cath: - CT abd negative for acute bleed -  Received IV iron.   - Ideally would like Hgb above 8 given coronary issues but need to balance with volume issues. Continue to monitor Hgb with dialysis.  - EGD on 8/15 with mild duodenitis with friability/ no ulceration  Hb 7.1 dropped to 6.5 today  received an additional unit PRBC. - Black stools;  Suspected SB source. - Consider changing brilinta to plavix if persists, but then check P2Y 12 test to make sure plavix responsive.  7. Myathsthenia gravis on mestinon  8. Prominent Left iliac artery ? Mildly aneursmal  DR TK note has summarized the CV plan  Doing much better  Continue current coruse   Reviewed course with family  More than 50% of 25 min was spent in counseling related to the above  Signed, Virl Axe, MD  06/13/2018, 8:53 AM    For questions or updates, please contact [place] Please consult [email or web address] for contact info under Cardiology/STEMI.

## 2018-06-14 LAB — GLUCOSE, CAPILLARY
GLUCOSE-CAPILLARY: 105 mg/dL — AB (ref 70–99)
GLUCOSE-CAPILLARY: 115 mg/dL — AB (ref 70–99)
GLUCOSE-CAPILLARY: 91 mg/dL (ref 70–99)
Glucose-Capillary: 121 mg/dL — ABNORMAL HIGH (ref 70–99)

## 2018-06-14 LAB — CBC
HCT: 28.4 % — ABNORMAL LOW (ref 39.0–52.0)
Hemoglobin: 9.1 g/dL — ABNORMAL LOW (ref 13.0–17.0)
MCH: 29.9 pg (ref 26.0–34.0)
MCHC: 32 g/dL (ref 30.0–36.0)
MCV: 93.4 fL (ref 78.0–100.0)
Platelets: 172 10*3/uL (ref 150–400)
RBC: 3.04 MIL/uL — ABNORMAL LOW (ref 4.22–5.81)
RDW: 20.1 % — AB (ref 11.5–15.5)
WBC: 9.3 10*3/uL (ref 4.0–10.5)

## 2018-06-14 LAB — RENAL FUNCTION PANEL
ALBUMIN: 2.3 g/dL — AB (ref 3.5–5.0)
Anion gap: 18 — ABNORMAL HIGH (ref 5–15)
BUN: 89 mg/dL — AB (ref 6–20)
CO2: 19 mmol/L — AB (ref 22–32)
Calcium: 8.3 mg/dL — ABNORMAL LOW (ref 8.9–10.3)
Chloride: 96 mmol/L — ABNORMAL LOW (ref 98–111)
Creatinine, Ser: 10.32 mg/dL — ABNORMAL HIGH (ref 0.61–1.24)
GFR calc Af Amer: 6 mL/min — ABNORMAL LOW (ref >60)
GFR calc non Af Amer: 5 mL/min — ABNORMAL LOW (ref >60)
GLUCOSE: 90 mg/dL (ref 70–99)
PHOSPHORUS: 10.4 mg/dL — AB (ref 2.5–4.6)
Potassium: 4.2 mmol/L (ref 3.5–5.1)
SODIUM: 133 mmol/L — AB (ref 135–145)

## 2018-06-14 MED ORDER — LOPERAMIDE HCL 2 MG PO CAPS
4.0000 mg | ORAL_CAPSULE | ORAL | Status: DC | PRN
  Administered 2018-06-14: 4 mg via ORAL
  Filled 2018-06-14: qty 2

## 2018-06-14 MED ORDER — SEVELAMER CARBONATE 2.4 G PO PACK
2.4000 g | PACK | Freq: Three times a day (TID) | ORAL | Status: DC
  Administered 2018-06-14 – 2018-06-19 (×13): 2.4 g via ORAL
  Filled 2018-06-14 (×17): qty 1

## 2018-06-14 MED ORDER — ACETAMINOPHEN 325 MG PO TABS: 650.0000 mg | ORAL_TABLET | Freq: Four times a day (QID) | ORAL | Status: DC | PRN

## 2018-06-14 MED ORDER — SEVELAMER CARBONATE 2.4 G PO PACK: 4.8000 g | PACK | Freq: Three times a day (TID) | ORAL | Status: DC

## 2018-06-14 MED ORDER — ACETAMINOPHEN 325 MG PO TABS
650.0000 mg | ORAL_TABLET | Freq: Four times a day (QID) | ORAL | Status: DC | PRN
  Administered 2018-06-14: 650 mg via ORAL
  Filled 2018-06-14: qty 2

## 2018-06-14 NOTE — Progress Notes (Signed)
[facility] TEAM 1 - Stepdown/ICU TEAM  Gaynor Ferreras  CHE:[id number] DOB: May 11, 1966 DOA: 05/22/2018 PCP: No primary care provider on file.    Brief Narrative:  52yo w/ a hx of myasthenia gravis, tobacco abuse, alcohol abuse, and cocaine abuse who had an out of hospital arrest on 05/22/2018.  Since his admission he has undergone cardiac cath with LAD stenting. He has suffered blood loss anemia and required transfusions. He was transferred to the intensive care unit on 06/05/2018 for use of Precedex w/ severe agitation.    Significant Events: 7/27 admit after out of hospital arrest 7/27 cath - stent to LAD 7/28 TTE EF 25-30% 8/10 back to ICU for agitation > precedex  8/14 TTE EF 45% 8/15 precedex stopped  8/15 EGD - mild duodenitis w/o ulceration  Subjective: The patient tells me he feels much better now that his NG tube has been removed.  He states his appetite is been good and his intake has been very high.  He denies chest pain or shortness of breath at rest.  He is anxious to begin to move more.  Assessment & Plan:  Out-of-hospital cardiac arrest - VFib - ACS w/ 100% LAD lesion Status post cardiac catheterization 05/22/2018 with stent LAD - on ASA and Brilinta for 12 months - initial EF 25-30% 7/28 > EF 45% via TTE 06/09/18 - Cards following   Anoxic encephalopathy - Agitation  Off precedex since 8/15 - avoid sedatives as able - is outside window for withdrawal - B12 and folate not low - appears to be improving steadily, but short term memory still appears quite impaired   Acute renal failure with anuria Due to ischemic and toxic ATN - cont HD MWF as per Nephrology - now ~3 weeks w/ no sign of recovery - plan for permanent access placement next week   Tobacco abuse Cont to counsel on need for complete abstinence  Suspected COPD Will benefit from formal PFTs as outpt   Left CFA dilation 1.9cm on CT 06/06/18 Noted incidentally on CTabdom   Malnourished Intake appears  significantly improved since discontinuation of feeding tube -continue to follow  Dysphagia cleared for D2 on 06/10/18 - cont to encourage intake   Multifactorial Anemia Keep Hgb 7.0 or > -no current evidence of bleeding -monitor  Recent Labs  Lab 06/11/18 0244 06/11/18 1433 06/12/18 0718 06/13/18 0155 06/14/18 0241  HGB 6.5* 9.2* 10.9* 10.2* 9.1*    Possible slow GI Bleed / Melena EGD 06/10/18 with duondenitis only (not source of bleeding) - small bowel source suspected - GI suggests CTa abdom w/ contrast if cleared by Nephrology, or in lieu of this a NM bleeding scan - appears to have stopped for now so will not pursue either at this time  Bleeding episode from placement of tunneled HD catheter R IJ (on DAPT) No RP bleed on CT 06/06/18   Polysubstance abuse   Pt was using cocaine in the week leading up to his arrest per family - UDS confirms this   Myasthenia gravis Mestinon per Neurology - s/p thymus resection - pt has been refusing to take his mestinon but presently does not appear to be symptomatic  DVT prophylaxis: SCDs Code Status: FULL CODE Family Communication: pt examined in HD unit  Disposition Plan: SDU  Consultants:  [facility] Cardiology Nephrology [facility]   Antimicrobials:  None presently   Objective: Blood pressure 116/65, pulse 71, temperature 98.4 F (36.9 C), temperature source Oral, resp. rate 16, height 5\' 10"  (1.778  m), weight 56 kg, SpO2 99 %.  Intake/Output Summary (Last 24 hours) at 06/14/2018 1522 Last data filed at 06/14/2018 1217 Gross per 24 hour  Intake 1116.94 ml  Output -158 ml  Net 1274.94 ml   Filed Weights   06/14/18 0500 06/14/18 0745 06/14/18 1046  Weight: 53.5 kg 54.5 kg 56 kg    Examination: General: No acute respiratory distress - cachectic Lungs: Clear to auscultation bilaterally Cardiovascular: Regular rate and rhythm without murmur or rub  Abdomen: Nontender, nondistended, soft, bowel sounds positive, no  rebound Extremities: No significant edema B LE   CBC: Recent Labs  Lab 06/09/18 0339 06/10/18 0230 06/11/18 0244  06/12/18 0718 06/13/18 0155 06/14/18 0241  WBC 8.8 6.6 7.4   < > 9.2 9.0 9.3  NEUTROABS 7.0 4.7 5.7  --   --   --   --   HGB 7.0* 7.1* 6.5*   < > 10.9* 10.2* 9.1*  HCT 21.8* 21.7* 20.8*   < > 35.3* 32.0* 28.4*  MCV 87.9 90.0 91.6   < > 92.9 92.0 93.4  PLT 224 120* 140*   < > 146* 148* 172   < > = values in this interval not displayed.   Basic Metabolic Panel: Recent Labs  Lab 06/10/18 0230 06/11/18 0244 06/12/18 0718 06/13/18 0155 06/14/18 0241  NA 136 136 136 136 133*  K 4.6 5.0 3.9 4.2 4.2  CL 97* 98 98 98 96*  CO2 27 21* 23 22 19*  GLUCOSE 100* 96 131* 118* 90  BUN 84* 128* 49* 69* 89*  CREATININE 5.88* 8.49* 6.53* 8.55* 10.32*  CALCIUM 7.7* 8.0* 8.8* 8.9 8.3*  MG 2.4 2.7*  --  2.8*  --   PHOS  --  7.8* 10.5* 10.9* 10.4*   GFR: Estimated Creatinine Clearance: 6.7 mL/min (A) (by C-G formula based on SCr of 10.32 mg/dL (H)).  Liver Function Tests: Recent Labs  Lab 06/08/18 0416 06/09/18 0339 06/10/18 0230 06/11/18 0244 06/12/18 0718 06/13/18 0155 06/14/18 0241  AST 35 35 36  --   --  22  --   ALT 12 13 16   --   --  14  --   ALKPHOS 121 107 101  --   --  137*  --   BILITOT 0.9 0.8 0.7  --   --  0.7  --   PROT 5.1* 5.4* 5.1*  --   --  6.5  --   ALBUMIN 1.9*  1.9* 2.0*  2.0* 2.0* 2.1* 2.7* 2.5* 2.3*    Cardiac Enzymes: Recent Labs  Lab 06/10/18 1910 06/11/18 0244  TROPONINI 0.06* 0.06*    HbA1C: Hgb A1c MFr Bld  Date/Time Value Ref Range Status  05/22/2018 08:27 PM 5.7 (H) 4.8 - 5.6 % Final    Comment:    (NOTE) Pre diabetes:          5.7%-6.4% Diabetes:              >6.4% Glycemic control for   <7.0% adults with diabetes     CBG: Recent Labs  Lab 06/13/18 1630 06/13/18 2237 06/14/18 0028 06/14/18 0500 06/14/18 1204  GLUCAP 95 101* 115* 91 121*    Recent Results (from the past 240 hour(s))  Culture, blood  (Routine X 2) w Reflex to ID Panel     Status: None   Collection Time: 06/04/18  5:00 PM  Result Value Ref Range Status   Specimen Description BLOOD HEMODIALYSIS CATHETER  Final   Special Requests  Final    BOTTLES DRAWN AEROBIC AND ANAEROBIC Blood Culture adequate volume   Culture   Final    NO GROWTH 5 DAYS Performed at [facility], [place] [address], [place], [place] [address]    Report Status 06/09/2018 FINAL  Final     Scheduled Meds: . aspirin  81 mg Oral Daily  . atorvastatin  40 mg Oral q1800  . chlorhexidine  15 mL Mouth Rinse BID  . Chlorhexidine Gluconate Cloth  6 each Topical Q0600  . darbepoetin (ARANESP) injection - DIALYSIS  200 mcg Intravenous Q Fri-HD  . dicyclomine  10 mg Oral TID AC & HS  . mouth rinse  15 mL Mouth Rinse q12n4p  . multivitamin  1 tablet Oral QHS  . nebivolol  5 mg Oral QHS  . pantoprazole  40 mg Oral BID  . pyridostigmine  60 mg Oral Q8H  . QUEtiapine  50 mg Oral BID  . sevelamer carbonate  2.4 g Oral TID WC  . sodium chloride flush  3 mL Intravenous Q12H  . ticagrelor  90 mg Oral BID     LOS: 23 days   Cherene Altes, MD [facility] Office  [phone] Pager - Text Page per Shea Evans  If 7PM-7AM, please contact night-coverage per Amion 06/14/2018, 3:22 PM

## 2018-06-14 NOTE — Progress Notes (Signed)
Progress Note  Patient Name: Howard Thompson Date of Encounter: 06/14/2018  Primary Cardiologist: No primary care provider on file.   Subjective   Patient denies any chest pain or SOB. Wants to get up and do more.   Inpatient Medications    Scheduled Meds: . aspirin  81 mg Oral Daily  . atorvastatin  40 mg Oral q1800  . chlorhexidine  15 mL Mouth Rinse BID  . Chlorhexidine Gluconate Cloth  6 each Topical Q0600  . darbepoetin (ARANESP) injection - DIALYSIS  200 mcg Intravenous Q Fri-HD  . dicyclomine  10 mg Oral TID AC & HS  . mouth rinse  15 mL Mouth Rinse q12n4p  . multivitamin  1 tablet Oral QHS  . nebivolol  5 mg Oral QHS  . pantoprazole  40 mg Oral BID  . pyridostigmine  60 mg Oral Q8H  . QUEtiapine  50 mg Oral BID  . sevelamer carbonate  2.4 g Oral TID WC  . sodium chloride flush  3 mL Intravenous Q12H  . ticagrelor  90 mg Oral BID   Continuous Infusions: . sodium chloride 250 mL (06/05/18 2138)  . dextrose 5 % and 0.9% NaCl 10 mL/hr at 06/14/18 0500  . ferumoxytol 510 mg (06/07/18 0926)   PRN Meds: sodium chloride, acetaminophen, albuterol, Gerhardt's butt cream, hydrALAZINE, labetalol, loperamide, LORazepam, nitroGLYCERIN, ondansetron (ZOFRAN) IV, sodium chloride flush   Vital Signs    Vitals:   06/14/18 1000 06/14/18 1030 06/14/18 1046 06/14/18 1217  BP: 119/61 100/76 119/65 116/65  Pulse: 66 85 76 71  Resp: 16 16 16    Temp:   98.4 F (36.9 C) 98.4 F (36.9 C)  TempSrc:   Oral Oral  SpO2:   99% 99%  Weight:   56 kg   Height:        Intake/Output Summary (Last 24 hours) at 06/14/2018 1439 Last data filed at 06/14/2018 1217 Gross per 24 hour  Intake 1116.94 ml  Output -158 ml  Net 1274.94 ml   Filed Weights   06/14/18 0500 06/14/18 0745 06/14/18 1046  Weight: 53.5 kg 54.5 kg 56 kg    Telemetry    NSR/sinus bradycardia - Personally Reviewed  ECG    None today- Personally Reviewed  Physical Exam   GEN: No acute distress.   Neck: No  JVD Cardiac: RRR, no murmurs, rubs, or gallops. HD catheter in right chest Respiratory: Clear to auscultation bilaterally. GI: Soft, nontender, non-distended  MS: No edema; No deformity. Neuro:  Nonfocal  Psych: Normal affect   Labs    Chemistry Recent Labs  Lab 06/09/18 0339 06/10/18 0230  06/12/18 0718 06/13/18 0155 06/14/18 0241  NA 136 136   < > 136 136 133*  K 5.1 4.6   < > 3.9 4.2 4.2  CL 97* 97*   < > 98 98 96*  CO2 23 27   < > 23 22 19*  GLUCOSE 113* 100*   < > 131* 118* 90  BUN 140* 84*   < > 49* 69* 89*  CREATININE 8.96* 5.88*   < > 6.53* 8.55* 10.32*  CALCIUM 8.1* 7.7*   < > 8.8* 8.9 8.3*  PROT 5.4* 5.1*  --   --  6.5  --   ALBUMIN 2.0*  2.0* 2.0*   < > 2.7* 2.5* 2.3*  AST 35 36  --   --  22  --   ALT 13 16  --   --  14  --  ALKPHOS 107 101  --   --  137*  --   BILITOT 0.8 0.7  --   --  0.7  --   GFRNONAA 6* 10*   < > 9* 6* 5*  GFRAA 7* 12*   < > 10* 7* 6*  ANIONGAP 16* 12   < > 15 16* 18*   < > = values in this interval not displayed.     Hematology Recent Labs  Lab 06/12/18 0718 06/13/18 0155 06/14/18 0241  WBC 9.2 9.0 9.3  RBC 3.80* 3.48* 3.04*  HGB 10.9* 10.2* 9.1*  HCT 35.3* 32.0* 28.4*  MCV 92.9 92.0 93.4  MCH 28.7 29.3 29.9  MCHC 30.9 31.9 32.0  RDW 20.0* 19.5* 20.1*  PLT 146* 148* 172    Cardiac Enzymes Recent Labs  Lab 06/10/18 1910 06/11/18 0244  TROPONINI 0.06* 0.06*   No results for input(s): TROPIPOC in the last 168 hours.   BNPNo results for input(s): BNP, PROBNP in the last 168 hours.   DDimer No results for input(s): DDIMER in the last 168 hours.   Radiology    No results found.  Cardiac Studies  Cath: 05/22/18   Ost LAD to Prox LAD lesion is 100% stenosed.  A drug-eluting stent was successfully placed using a STENT SYNERGY DES 3X20.  Post intervention, there is a 0% residual stenosis.  There is severe left ventricular systolic dysfunction.  LV end diastolic pressure is mildly elevated.  The left  ventricular ejection fraction is less than 25% by visual estimate.   TTE: 05/23/18  Study Conclusions  - Left ventricle: The cavity size was normal. Systolic function was severely reduced. The estimated ejection fraction was in the range of 25% to 30%. Severe diffuse hypokinesis with regional variations. There is akinesis of the apicalanterior and apical myocardium. There is akinesis of the midanteroseptal myocardium. There is severe hypokinesis of the inferoseptal myocardium. Features are consistent with a pseudonormal left ventricular filling pattern, with concomitant abnormal relaxation and increased filling pressure (grade 2 diastolic dysfunction). Doppler parameters are consistent with high ventricular filling pressure. - Aortic valve: A bicuspid morphology cannot be excluded; mildly thickened, mildly calcified leaflets. There was mild to moderate regurgitation. Regurgitation pressure half-time: 718 ms. - Mitral valve: There was mild to moderate regurgitation. Valve area by pressure half-time: 2.39 cm^2. - Right ventricle: Systolic function was moderately reduced. - Atrial septum: There was increased thickness of the septum, consistent with lipomatous hypertrophy. - Tricuspid valve: There was mild regurgitation. - Pulmonary arteries: PA peak pressure: 33 mm Hg (S). - Inferior vena cava: The vessel was dilated. The respirophasic diameter changes were blunted (<50%), consistent with elevated central venous pressure. - Pericardium, extracardiac: A small, free-flowing pericardial effusion was identified circumferential to the heart. The fluid had no internal echoes.There was no evidence of hemodynamic compromise.  Impressions:  - severe LV dysfunction with diffuse HK but apica/anteroseptal and apical anterior AK. Recommend definity contrast study to rule out apical  thrombus.   ------------------------------------------------------------------- 06/09/2018 ECHO Study Conclusions  - Left ventricle: The cavity size was normal. Systolic function was mildly to moderately reduced. The estimated ejection fraction was in the range of 40% to 45%. Images were inadequate for LV wall motion assessment. The study is not technically sufficient to allow evaluation of LV diastolic function. - Aortic valve: There was mild regurgitation. - Mitral valve: Calcified annulus. There was mild regurgitation. - Atrial septum: There was increased thickness of the septum, consistent with lipomatous hypertrophy. - Pulmonary arteries:  Systolic pressure could not be accurately estimated.  Recommendations: Suggest definity contrast study to evaluate wall motion. Complications: Apical images were extremely painful for the patient due to his broken ribs.  Patient Profile     52 y.o. male 52 y.o. male out of hospital cardiac arrest, prior history of MI several years ago, myasthenia gravis post thymus resection, anterior ST elevation post LAD stent. Extubated on 8/2.   Assessment & Plan    1. Cardiac arrest: Underwent cath with PCI/DES x1 to the pLADon 05/22/18.Plan for DAPT with ASA/Brilinta for 12 months. ASAwasheld in the setting of thrombocytopenia, but has since been resumed.Platelet count improved. In  light of h/o cocaine use, lopressor changedto carvedilol 6.25mg  BID, but due to potential concern by pulmonolgy of lung issues from non-cardioselectivity will consider changing to bystolic for cardioselectivity and with nitric oxide benefit to reduce vasospasm On statin with Crestor. LFTS stable.   2.  ICM:EF noted at 25-30%. Switched IV BB to PO. No room for ARB/ACEi given AKF. Later EF improved to 40-45%.   3. Anoxic encephalopathy: EEG was abnormal but repeat now improving.on Ativan and Seroquel.   4. Acute renal failure:Nephrology following.  S/p placement of new dialysis catheter right subclavian. HD.   5. Polysubstance abuse:+ for cocaine on admission.   6. Acute blood loss anemia 2/2 to bleeding HD cath:  EGD on 8/15 with mild duodenitis with friability/ no ulceration  Hb 7.1 dropped to 6.5 today  received an additional unit PRBC. - Black stools;  Suspected SB source.  7. Myasthenia gravis     For questions or updates, please contact [employer] Please consult [email or web address] for contact info under Cardiology/STEMI.      Signed, Peter Martinique, MD  06/14/2018, 2:39 PM

## 2018-06-14 NOTE — Care Management Note (Signed)
Case Management Note  Patient Details  Name: Howard Thompson MRN: [id number] Date of Birth: [birth year]  Subjective/Objective:      Pt admitted post cardiac arrest - now with AKI requiring IHD           Action/Plan:  PTA from home.  CSW consulted for current abuse.    Expected Discharge Date:                  Expected Discharge Plan:  IP Rehab Facility(SNF as back up )  In-House Referral:  Clinical Social Work  Discharge planning Services  CM Consult  Post Acute Care Choice:    Choice offered to:     DME Arranged:    DME Agency:     HH Arranged:    [place] Agency:     Status of Service:  In process, will continue to follow  If discussed at Long Length of Stay Meetings, dates discussed:    Additional Comments: 06/14/2018  Pt now on progressive unit.  Pt will need to be clipped and have perm HD access placed.  CSW following alng with CIR  06/11/18 Pt is now off precedex.  CIR and CSW following for placement.  Renal status has not yet been declared - renal following for HD needs   06/08/18 Pt continues to require Precedex.  CM will continue to follow Maryclare Labrador, RN 06/14/2018, 11:11 AM

## 2018-06-14 NOTE — Procedures (Signed)
Patient was seen on dialysis and the procedure was supervised.  BFR 400  Via TDC BP is  116/67.   Patient appears to be tolerating treatment well- BP dropped - have decreased goal as volume seems good   Yoseph Haile A 06/14/2018

## 2018-06-14 NOTE — Progress Notes (Signed)
Inpatient Rehabilitation  Note events over the weekend.  Patient transferred to step-down unit and orders for IP Rehab likely got discontinued at that time, I have re-ordered.  Plan to continue to follow for timing of medical readiness, patient/family decision, insurance authorization, and IP Rehab bed availability.  Call if questions.  Carmelia Roller., CCC/SLP Admission Coordinator  [facility]  Cell [phone]

## 2018-06-14 NOTE — Progress Notes (Signed)
  Speech Language Pathology Treatment: Dysphagia  Patient Details Name: Howard Thompson MRN: [id number] DOB: [birth year] Today's Date: 06/14/2018 Time: 5498-2641 SLP Time Calculation (min) (ACUTE ONLY): 19 min  Assessment / Plan / Recommendation Clinical Impression  Pt's speech is significantly clearer than during this SLP's last visit. He and his sister believe that it has returned to baseline, although he also comments that his voice sounds deeper. His vocal quality, however, is clear. He self-fed regular textures and thin liquid with no overt signs of dysphagia or aspiration. He describes better intake and no overt difficulty with meals; his sister agrees, but also says that she gives him some prompting to take smaller bites. Recommend advancement to regular textures, continuing thin liquids. SLP will f/u briefly to ensure tolerance.   HPI HPI: Howard Thompson is a 52 y.o. male admitted on 7/27 s/p unwitnessed out of hospital cardiac arrest, VF, unknown downtime, past medical history of coronary artery disease, myasthenia gravis, hypertension.  Per wife, pt has had hx of dysphagia related to MG which is largely resolved.  Pt was intubated from 7/27 - 8/2.      SLP Plan  Continue with current plan of care       Recommendations  Diet recommendations: Regular;Thin liquid Liquids provided via: Straw;Cup Medication Administration: Whole meds with liquid Supervision: Patient able to self feed;Intermittent supervision to cue for compensatory strategies Compensations: Minimize environmental distractions;Slow rate;Small sips/bites Postural Changes and/or Swallow Maneuvers: Seated upright 90 degrees;Upright 30-60 min after meal                Oral Care Recommendations: Oral care BID Follow up Recommendations: Inpatient Rehab SLP Visit Diagnosis: Dysphagia, oropharyngeal phase (R13.12) Plan: Continue with current plan of care       GO                Germain Osgood 06/14/2018,  5:01 PM  Germain Osgood, M.A. CCC-SLP [phone]

## 2018-06-14 NOTE — Progress Notes (Signed)
Patient arrived to unit, telemetry placed and call bell within reach

## 2018-06-14 NOTE — Progress Notes (Signed)
Physical Therapy Treatment Patient Details Name: Howard Thompson MRN: [id number] DOB: December 03, 1965 Today's Date: 06/14/2018    History of Present Illness 52 y.o. male out of hospital cardiac arrest (05/22/18), Intubated 7/27- 8/2.  s/p IJ tunneled HD catheter placed 05/31/18 with bleeding from IJ site. Pt with anoxic encephalopathy.Marland Kitchen PMHx: myasthenia gravis post thymus resection, HTN, anterior STEMI post LAD stent.     PT Comments    Pt able to tolerate standing and short distance ambulation today prior to onset of dizziness and drop in BP. Pt's sister Howard Thompson very helpful and agreed to help pt with HEP. Cont to recommend CIR upon discharge for maximal functional and cognitive return. 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 Acute PT to cont to follow.    Follow Up Recommendations  CIR;Supervision/Assistance - 24 hour     Equipment Recommendations       Recommendations for Other Services       Precautions / Restrictions Precautions Precautions: Fall Precaution Comments: suboccipital pressure wound, watch BP Restrictions Weight Bearing Restrictions: No    Mobility  Bed Mobility Overal bed mobility: Needs Assistance Bed Mobility: Supine to Sit     Supine to sit: Min assist;Mod assist     General bed mobility comments: max directional verbal cues due to impaired sequencing, min/modA for trunk elevation for sidelying position  Transfers Overall transfer level: Needs assistance Equipment used: Rolling walker (2 wheeled) Transfers: Sit to/from Stand Sit to Stand: Min assist;Mod assist         General transfer comment: max directional verbal cues for sequencing and pushing up from chair or bed,  minA to power up  Ambulation/Gait Ambulation/Gait assistance: Min assist;Mod assist;+2 physical assistance;+2 safety/equipment Gait Distance (Feet): 20 Feet(x2) Assistive device: Rolling walker (2 wheeled) Gait Pattern/deviations: Step-through pattern;Decreased stance time  - left;Narrow base of support Gait velocity: decreased Gait velocity interpretation: <1.8 ft/sec, indicate of risk for recurrent falls General Gait Details: pt slow, modA for walker management, no scissor gate but very narrow base of support. pt with onset of dizziness BP 93/67. pt took a seated break and then ambulated again. pt c/o nausea   Stairs             Wheelchair Mobility    Modified Rankin (Stroke Patients Only)       Balance Overall balance assessment: Needs assistance Sitting-balance support: Feet supported;No upper extremity supported Sitting balance-Leahy Scale: Fair     Standing balance support: Bilateral upper extremity supported Standing balance-Leahy Scale: Poor Standing balance comment: able to maintain standing balance until he became dizzy                            Cognition Arousal/Alertness: Awake/alert Behavior During Therapy: Impulsive Overall Cognitive Status: Impaired/Different from baseline Area of Impairment: Orientation;Attention;Memory;Safety/judgement;Following commands;Awareness;Problem solving                 Orientation Level: Disoriented to;Situation(doesn't recall incidient or what people have told him ) Current Attention Level: Sustained Memory: Decreased short-term memory Following Commands: Follows one step commands with increased time;Follows one step commands inconsistently Safety/Judgement: Decreased awareness of safety;Decreased awareness of deficits Awareness: Emergent Problem Solving: Slow processing;Difficulty sequencing;Requires verbal cues;Requires tactile cues        Exercises General Exercises - Lower Extremity Ankle Circles/Pumps: AROM;Both;Seated Long Arc Quad: AROM;Both;Seated;5 reps Hip Flexion/Marching: AROM;Both;5 reps;Seated    General Comments General comments (skin integrity, edema, etc.): sister Howard Thompson present  Pertinent Vitals/Pain Pain Assessment: Faces Faces Pain  Scale: Hurts even more Pain Location: chest and buttocks Pain Descriptors / Indicators: Grimacing;Guarding Pain Intervention(s): Limited activity within patient's tolerance    Home Living                      Prior Function            PT Goals (current goals can now be found in the care plan section) Acute Rehab PT Goals Patient Stated Goal: didn't state Progress towards PT goals: Progressing toward goals    Frequency    Min 4X/week      PT Plan Frequency needs to be updated    Co-evaluation              AM-PAC PT "6 Clicks" Daily Activity  Outcome Measure  Difficulty turning over in bed (including adjusting bedclothes, sheets and blankets)?: Unable Difficulty moving from lying on back to sitting on the side of the bed? : Unable Difficulty sitting down on and standing up from a chair with arms (e.g., wheelchair, bedside commode, etc,.)?: Unable Help needed moving to and from a bed to chair (including a wheelchair)?: A Lot Help needed walking in hospital room?: A Lot Help needed climbing 3-5 steps with a railing? : Total 6 Click Score: 8    End of Session Equipment Utilized During Treatment: Gait belt Activity Tolerance: Patient limited by pain Patient left: in chair;with call bell/phone within reach;with family/visitor present Nurse Communication: Mobility status PT Visit Diagnosis: Unsteadiness on feet (R26.81);Muscle weakness (generalized) (M62.81);Other abnormalities of gait and mobility (R26.89);Other symptoms and signs involving the nervous system (R29.898)     Time: 1338-1410 PT Time Calculation (min) (ACUTE ONLY): 32 min  Charges:  $Gait Training: 8-22 mins $Therapeutic Exercise: 8-22 mins                     Kittie Plater, PT, DPT Pager #: [phone] Office #: [phone]    [place] 06/14/2018, 2:34 PM

## 2018-06-14 NOTE — Progress Notes (Addendum)
Subjective: Interval History: Patient feels okay today, asking for some food and drink. Apparently is better mental status wise  Objective: Vital signs in last 24 hours: Temp:  [97.6 F (36.4 C)-98.2 F (36.8 C)] 97.8 F (36.6 C) (08/19 0500) Pulse Rate:  [63-79] 72 (08/19 0500) Resp:  [13-22] 14 (08/19 0500) BP: (114-141)/(67-78) 127/72 (08/19 0500) SpO2:  [69 %-100 %] 69 % (08/19 0500) Weight:  [53.5 kg] 53.5 kg (08/19 0500) Weight change: 0.3 kg  Intake/Output from previous day: 08/18 0701 - 08/19 0700 In: 1176.9 [P.O.:340; I.V.:836.9] Out: 0  Intake/Output this shift: No intake/output data recorded.  Physical Exam  Constitutional: He is oriented to person, place, and time. He appears well-developed and well-nourished. No distress.  Cardiovascular: Normal rate, regular rhythm, normal heart sounds and intact distal pulses.  Pulmonary/Chest: Effort normal and breath sounds normal. No respiratory distress.  Abdominal: Soft. Bowel sounds are normal. He exhibits no distension. There is no tenderness.  Musculoskeletal: He exhibits no edema or deformity.  Neurological: He is alert and oriented to person, place, and time.  Oriented to person, city, building and year  Skin: Skin is warm and dry.    Lab Results: Recent Labs    06/13/18 0155 06/14/18 0241  WBC 9.0 9.3  HGB 10.2* 9.1*  HCT 32.0* 28.4*  PLT 148* 172   BMET:  Recent Labs    06/13/18 0155 06/14/18 0241  NA 136 133*  K 4.2 4.2  CL 98 96*  CO2 22 19*  GLUCOSE 118* 90  BUN 69* 89*  CREATININE 8.55* 10.32*  CALCIUM 8.9 8.3*   No results for input(s): PTH in the last 72 hours. Iron Studies: No results for input(s): IRON, TIBC, TRANSFERRIN, FERRITIN in the last 72 hours. CBG (last 3)  Recent Labs    06/13/18 2237 06/14/18 0028 06/14/18 0500  GLUCAP 101* 115* 91     Studies/Results: No results found.  I have reviewed the patient's current medications.  Assessment/Plan: Acute Renal Failure:  Anuric, 2/2 Ischemic and toxic ATN. On Intermittent HD MWF. No recovery of renal function for ~3 weeks. Will likely need permanent access this week if no improvement. Phos 10.4. - HD session today cut short due to clotting line, will clear line with thrombolytics. Recheck RFP in AM to determine if HD needed on 8/20 or hold off until 8/21. - Aranesp 235mcg/wk, Sevelamer 2.4g TID    HTN: Normotensive today. PRNs have not been used in several days. On bystolic for CAD  Anemia: Renal Failure, Previous bleeding at HD cath site resolved, and GIB etiology unclear (Heme + 8/16), EGD 8/15 showd duodenitis, no bleed), Hgb 9.1 today from 10.7. - Aranesp 278mcg/wk - Treatment for GIB per primary  CAD, MI, S/P Arrest on 7/27 & ICM: Per primary and cardiology MG: Per primary team Substance abuse   LOS: 23 days   Neva Seat 06/14/2018,7:45 AM   Patient seen and examined, agree with above note with above modifications. 52 year old WM found down on 7/27- - cardiac arrest.  Has required renal replacement therapy in some way since 7/29 so today is 3 weeks.  Done Friday and today.  no uop recorded  and crt rising off of HD.  Will watch another couple of days but likely will need to pursue perm access and OP dialysis later this week.  Pt with appropriate questions- on aranesp, renvela for phosphate binding.  Will need to check PTH pre HD on Wed.  Needs heparin with HD- has  clotted 2 systems today   Corliss Parish, MD 06/14/2018

## 2018-06-14 NOTE — Progress Notes (Signed)
Pt transported to HD for AM treatment.  Transported on monitor.  Will watch for return to room.

## 2018-06-14 NOTE — Progress Notes (Signed)
Plan is for patient to go to CIR if able, by the end of the week.  Patient will need a permanent HD cath placed prior to CIR.  Patient level of care is now tele.  Patient to transfer to 19M for continued f/u care.

## 2018-06-15 LAB — RENAL FUNCTION PANEL
ANION GAP: 11 (ref 5–15)
Albumin: 2 g/dL — ABNORMAL LOW (ref 3.5–5.0)
BUN: 46 mg/dL — ABNORMAL HIGH (ref 6–20)
CALCIUM: 7.8 mg/dL — AB (ref 8.9–10.3)
CHLORIDE: 100 mmol/L (ref 98–111)
CO2: 28 mmol/L (ref 22–32)
Creatinine, Ser: 7.01 mg/dL — ABNORMAL HIGH (ref 0.61–1.24)
GFR calc Af Amer: 9 mL/min — ABNORMAL LOW (ref >60)
GFR calc non Af Amer: 8 mL/min — ABNORMAL LOW (ref >60)
GLUCOSE: 86 mg/dL (ref 70–99)
Phosphorus: 6.6 mg/dL — ABNORMAL HIGH (ref 2.5–4.6)
Potassium: 3.5 mmol/L (ref 3.5–5.1)
SODIUM: 139 mmol/L (ref 135–145)

## 2018-06-15 LAB — CBC
HEMATOCRIT: 25.6 % — AB (ref 39.0–52.0)
Hemoglobin: 7.9 g/dL — ABNORMAL LOW (ref 13.0–17.0)
MCH: 29.9 pg (ref 26.0–34.0)
MCHC: 30.9 g/dL (ref 30.0–36.0)
MCV: 97 fL (ref 78.0–100.0)
PLATELETS: 143 10*3/uL — AB (ref 150–400)
RBC: 2.64 MIL/uL — AB (ref 4.22–5.81)
RDW: 21.1 % — AB (ref 11.5–15.5)
WBC: 6.9 10*3/uL (ref 4.0–10.5)

## 2018-06-15 MED ORDER — NEBIVOLOL HCL 2.5 MG PO TABS
2.5000 mg | ORAL_TABLET | Freq: Every day | ORAL | Status: DC
  Administered 2018-06-15 – 2018-06-20 (×6): 2.5 mg via ORAL
  Filled 2018-06-15 (×7): qty 1

## 2018-06-15 MED ORDER — SODIUM CHLORIDE 0.9 % IV BOLUS
500.0000 mL | Freq: Once | INTRAVENOUS | Status: AC
  Administered 2018-06-15: 500 mL via INTRAVENOUS

## 2018-06-15 MED ORDER — ORAL CARE MOUTH RINSE
15.0000 mL | Freq: Two times a day (BID) | OROMUCOSAL | Status: DC
  Administered 2018-06-15 – 2018-06-21 (×5): 15 mL via OROMUCOSAL

## 2018-06-15 MED ORDER — CHLORHEXIDINE GLUCONATE CLOTH 2 % EX PADS
6.0000 | MEDICATED_PAD | Freq: Every day | CUTANEOUS | Status: DC
  Administered 2018-06-16 – 2018-06-18 (×3): 6 via TOPICAL

## 2018-06-15 NOTE — Progress Notes (Signed)
Progress Note  Patient Name: Howard Thompson Date of Encounter: 06/15/2018  Primary Cardiologist: No primary care provider on file.   Subjective   Patient denies any chest pain or SOB. Complains of nausea and weakness worse with ambulation. Dizzy.   Inpatient Medications    Scheduled Meds: . aspirin  81 mg Oral Daily  . atorvastatin  40 mg Oral q1800  . chlorhexidine  15 mL Mouth Rinse BID  . Chlorhexidine Gluconate Cloth  6 each Topical Q0600  . darbepoetin (ARANESP) injection - DIALYSIS  200 mcg Intravenous Q Fri-HD  . mouth rinse  15 mL Mouth Rinse q12n4p  . multivitamin  1 tablet Oral QHS  . nebivolol  2.5 mg Oral QHS  . pantoprazole  40 mg Oral BID  . pyridostigmine  60 mg Oral Q8H  . QUEtiapine  50 mg Oral BID  . sevelamer carbonate  2.4 g Oral TID WC  . sodium chloride flush  3 mL Intravenous Q12H  . ticagrelor  90 mg Oral BID   Continuous Infusions: . sodium chloride 250 mL (06/05/18 2138)  . sodium chloride     PRN Meds: sodium chloride, acetaminophen, albuterol, Gerhardt's butt cream, hydrALAZINE, loperamide, LORazepam, nitroGLYCERIN, ondansetron (ZOFRAN) IV, sodium chloride flush   Vital Signs    Vitals:   06/14/18 1900 06/14/18 2053 06/15/18 0500 06/15/18 0529  BP: 107/60 (!) 122/58  (!) 142/67  Pulse: 67 70  61  Resp: 18 18  16   Temp: 98.8 F (37.1 C) 98.4 F (36.9 C)  98.2 F (36.8 C)  TempSrc: Oral Oral  Oral  SpO2: 98% 98%  98%  Weight:   57 kg   Height:        Intake/Output Summary (Last 24 hours) at 06/15/2018 1044 Last data filed at 06/14/2018 1217 Gross per 24 hour  Intake 180 ml  Output -158 ml  Net 338 ml   Filed Weights   06/14/18 0745 06/14/18 1046 06/15/18 0500  Weight: 54.5 kg 56 kg 57 kg    Telemetry    NSR - Personally Reviewed  ECG    None today- Personally Reviewed  Physical Exam   GEN: No acute distress.   Neck: No JVD Cardiac: RRR, no murmurs, rubs, or gallops. HD catheter in right chest Respiratory: Clear  to auscultation bilaterally. GI: Soft, nontender, non-distended  MS: No edema; No deformity. Neuro:  Nonfocal  Psych: Normal affect   Labs    Chemistry Recent Labs  Lab 06/09/18 0339 06/10/18 0230  06/13/18 0155 06/14/18 0241 06/15/18 0548  NA 136 136   < > 136 133* 139  K 5.1 4.6   < > 4.2 4.2 3.5  CL 97* 97*   < > 98 96* 100  CO2 23 27   < > 22 19* 28  GLUCOSE 113* 100*   < > 118* 90 86  BUN 140* 84*   < > 69* 89* 46*  CREATININE 8.96* 5.88*   < > 8.55* 10.32* 7.01*  CALCIUM 8.1* 7.7*   < > 8.9 8.3* 7.8*  PROT 5.4* 5.1*  --  6.5  --   --   ALBUMIN 2.0*  2.0* 2.0*   < > 2.5* 2.3* 2.0*  AST 35 36  --  22  --   --   ALT 13 16  --  14  --   --   ALKPHOS 107 101  --  137*  --   --   BILITOT 0.8 0.7  --  0.7  --   --   GFRNONAA 6* 10*   < > 6* 5* 8*  GFRAA 7* 12*   < > 7* 6* 9*  ANIONGAP 16* 12   < > 16* 18* 11   < > = values in this interval not displayed.     Hematology Recent Labs  Lab 06/13/18 0155 06/14/18 0241 06/15/18 0548  WBC 9.0 9.3 6.9  RBC 3.48* 3.04* 2.64*  HGB 10.2* 9.1* 7.9*  HCT 32.0* 28.4* 25.6*  MCV 92.0 93.4 97.0  MCH 29.3 29.9 29.9  MCHC 31.9 32.0 30.9  RDW 19.5* 20.1* 21.1*  PLT 148* 172 143*    Cardiac Enzymes Recent Labs  Lab 06/10/18 1910 06/11/18 0244  TROPONINI 0.06* 0.06*   No results for input(s): TROPIPOC in the last 168 hours.   BNPNo results for input(s): BNP, PROBNP in the last 168 hours.   DDimer No results for input(s): DDIMER in the last 168 hours.   Radiology    No results found.  Cardiac Studies  Cath: 05/22/18   Ost LAD to Prox LAD lesion is 100% stenosed.  A drug-eluting stent was successfully placed using a STENT SYNERGY DES 3X20.  Post intervention, there is a 0% residual stenosis.  There is severe left ventricular systolic dysfunction.  LV end diastolic pressure is mildly elevated.  The left ventricular ejection fraction is less than 25% by visual estimate.   TTE: 05/23/18  Study  Conclusions  - Left ventricle: The cavity size was normal. Systolic function was severely reduced. The estimated ejection fraction was in the range of 25% to 30%. Severe diffuse hypokinesis with regional variations. There is akinesis of the apicalanterior and apical myocardium. There is akinesis of the midanteroseptal myocardium. There is severe hypokinesis of the inferoseptal myocardium. Features are consistent with a pseudonormal left ventricular filling pattern, with concomitant abnormal relaxation and increased filling pressure (grade 2 diastolic dysfunction). Doppler parameters are consistent with high ventricular filling pressure. - Aortic valve: A bicuspid morphology cannot be excluded; mildly thickened, mildly calcified leaflets. There was mild to moderate regurgitation. Regurgitation pressure half-time: 718 ms. - Mitral valve: There was mild to moderate regurgitation. Valve area by pressure half-time: 2.39 cm^2. - Right ventricle: Systolic function was moderately reduced. - Atrial septum: There was increased thickness of the septum, consistent with lipomatous hypertrophy. - Tricuspid valve: There was mild regurgitation. - Pulmonary arteries: PA peak pressure: 33 mm Hg (S). - Inferior vena cava: The vessel was dilated. The respirophasic diameter changes were blunted (<50%), consistent with elevated central venous pressure. - Pericardium, extracardiac: A small, free-flowing pericardial effusion was identified circumferential to the heart. The fluid had no internal echoes.There was no evidence of hemodynamic compromise.  Impressions:  - severe LV dysfunction with diffuse HK but apica/anteroseptal and apical anterior AK. Recommend definity contrast study to rule out apical thrombus.   ------------------------------------------------------------------- 06/09/2018 ECHO Study Conclusions  - Left ventricle: The cavity size was  normal. Systolic function was mildly to moderately reduced. The estimated ejection fraction was in the range of 40% to 45%. Images were inadequate for LV wall motion assessment. The study is not technically sufficient to allow evaluation of LV diastolic function. - Aortic valve: There was mild regurgitation. - Mitral valve: Calcified annulus. There was mild regurgitation. - Atrial septum: There was increased thickness of the septum, consistent with lipomatous hypertrophy. - Pulmonary arteries: Systolic pressure could not be accurately estimated.  Recommendations: Suggest definity contrast study to evaluate wall motion. Complications: Apical images  were extremely painful for the patient due to his broken ribs.  Patient Profile     52 y.o. male 52 y.o. male out of hospital cardiac arrest, prior history of MI several years ago, myasthenia gravis post thymus resection, anterior ST elevation post LAD stent. Extubated on 8/2.   Assessment & Plan    1. Cardiac arrest: Underwent cath with PCI/DES x1 to the pLADon 05/22/18.Plan for DAPT with ASA/Brilinta for 12 months. ASAwasheld in the setting of thrombocytopenia, but has since been resumed.Platelet count improved. Now on bystolic for cardioselectivity and with nitric oxide benefit to reduce vasospasm. He is orthostatic with standing so will reduce Bystolic dose to 2.5 mg daily.  On  Crestor. LFTS stable.   2.  ICM:EF noted at 25-30%. Switched IV BB to PO. No room for ARB/ACEi given AKF. Later EF improved to 40-45%. Will reduce beta blocker due to orthostasis.  3. Anoxic encephalopathy: EEG was abnormal but repeat now improving.on Ativan and Seroquel. Much improved.   4. Acute renal failure:Nephrology following. S/p placement of new dialysis catheter right subclavian. HD. Plans for placement of permanent access in the works.   5. Polysubstance abuse:+ for cocaine on admission.   6. Acute blood loss anemia 2/2 to  bleeding HD cath and GI bleed:  EGD on 8/15 with mild duodenitis with friability/ no ulceration  Hb 7.9 - had BM today - appeared normal. No blood seen.   7. Myasthenia gravis  8. Orthostasis. Multifactorial. Medication effects, dialysis, anemia, severe deconditioning. May need a little volume today- will defer to Renal. Reduce bystolic. No evidence of ongoing bleeding.      For questions or updates, please contact [facility] Please consult [email or web address] for contact info under Cardiology/STEMI.      Signed, Peter Martinique, MD  06/15/2018, 10:44 AM

## 2018-06-15 NOTE — Progress Notes (Signed)
Howard Thompson  CHE:[id number] DOB: 09/12/1966 DOA: 05/22/2018 PCP: No primary care provider on file.    Brief Narrative:  52yo w/ a hx of myasthenia gravis, tobacco abuse, alcohol abuse, and cocaine abuse who had an out of hospital arrest on 05/22/2018.  Since his admission he has undergone cardiac cath with LAD stenting. He has suffered blood loss anemia and required transfusions. He was transferred to the intensive care unit on 06/05/2018 for use of Precedex w/ severe agitation.    Significant Events: 7/27 admit after out of hospital arrest 7/27 cath - stent to LAD 7/28 TTE EF 25-30% 8/10 back to ICU for agitation > precedex  8/14 TTE EF 45% 8/15 precedex stopped  8/15 EGD - mild duodenitis w/o ulceration  Subjective: Patient reports is feeling much better today, was able to ablate in the room yesterday, but still have some mild weakness, denies any chest pain, or shortness of breath .  Assessment & Plan:  Out-of-hospital cardiac arrest - VFib - ACS w/ 100% LAD lesion Status post cardiac catheterization 05/22/2018 with stent LAD - on ASA and Brilinta for 12 months - initial EF 25-30% 7/28 > EF 45% via TTE 06/09/18 - Cards following  -Cannot tolerate ACE or arm in the setting of acute kidney injury, on beta-blockers, Bystolic has been lowered today to 2.5 per cardiology given orthostasis.  Ischemic cardiomyopathy -Please see above discussion  Anoxic encephalopathy - Agitation  Off precedex since 8/15 - avoid sedatives as able - is outside window for withdrawal - B12 and folate not low - appears to be improving steadily.  Acute renal failure with anuria Due to ischemic and toxic ATN - cont HD MWF as per Nephrology - now ~3 weeks w/ no sign of recovery -management per renal, we will need to pursue permanent access and outpatient dialysis later this week per renal  Tobacco abuse Cont to counsel on need for complete abstinence  Suspected COPD Will benefit from formal PFTs as outpt     Left CFA dilation 1.9cm on CT 06/06/18 Noted incidentally on CTabdom   Malnourished Intake appears significantly improved since discontinuation of feeding tube -continue to follow  Dysphagia cleared for D2 on 06/10/18 - cont to encourage intake   Multifactorial Anemia Keep Hgb 7.0 or > -no current evidence of bleeding -monitor In the setting of renal failure, previous bleeding and hemodialysis catheter, as well see below discussion regarding GI bleed.  Recent Labs  Lab 06/11/18 1433 06/12/18 0718 06/13/18 0155 06/14/18 0241 06/15/18 0548  HGB 9.2* 10.9* 10.2* 9.1* 7.9*    Possible slow GI Bleed / Melena EGD 06/10/18 with duondenitis only (not source of bleeding) - small bowel source suspected - GI suggests CTa abdom w/ contrast if cleared by Nephrology, or in lieu of this a NM bleeding scan - appears to have stopped for now so will not pursue either at this time, continue to monitor hemoglobin closely and transfuse as needed  Bleeding episode from placement of tunneled HD catheter R IJ (on DAPT) No RP bleed on CT 06/06/18   Polysubstance abuse   Pt was using cocaine in the week leading up to his arrest per family - UDS confirms this   Myasthenia gravis Mestinon per Neurology - s/p thymus resection - pt has been refusing to take his mestinon but presently does not appear to be symptomatic  DVT prophylaxis: SCDs Code Status: FULL CODE Family Communication: None at bedside Disposition Plan: Condition for CIR will place consult  Consultants:  PCCM Cardiology Nephrology [facility] Neurology   Antimicrobials:  None presently   Objective: Blood pressure (!) 142/67, pulse 61, temperature 98.2 F (36.8 C), temperature source Oral, resp. rate 16, height 5\' 10"  (1.778 m), weight 57 kg, SpO2 98 %.  Intake/Output Summary (Last 24 hours) at 06/15/2018 1240 Last data filed at 06/15/2018 1100 Gross per 24 hour  Intake 120 ml  Output -  Net 120 ml   Filed Weights    06/14/18 0745 06/14/18 1046 06/15/18 0500  Weight: 54.5 kg 56 kg 57 kg    Examination:  Awake Alert, awake, pleasant Symmetrical Chest wall movement, Good air movement bilaterally, CTAB, right upper chest permacath RRR,No Gallops,Rubs or new Murmurs, No Parasternal Heave +ve B.Sounds, Abd Soft, No tenderness, No rebound - guarding or rigidity. No Cyanosis, Clubbing or edema, No new Rash or bruise     CBC: Recent Labs  Lab 06/09/18 0339 06/10/18 0230 06/11/18 0244  06/13/18 0155 06/14/18 0241 06/15/18 0548  WBC 8.8 6.6 7.4   < > 9.0 9.3 6.9  NEUTROABS 7.0 4.7 5.7  --   --   --   --   HGB 7.0* 7.1* 6.5*   < > 10.2* 9.1* 7.9*  HCT 21.8* 21.7* 20.8*   < > 32.0* 28.4* 25.6*  MCV 87.9 90.0 91.6   < > 92.0 93.4 97.0  PLT 224 120* 140*   < > 148* 172 143*   < > = values in this interval not displayed.   Basic Metabolic Panel: Recent Labs  Lab 06/10/18 0230 06/11/18 0244  06/13/18 0155 06/14/18 0241 06/15/18 0548  NA 136 136   < > 136 133* 139  K 4.6 5.0   < > 4.2 4.2 3.5  CL 97* 98   < > 98 96* 100  CO2 27 21*   < > 22 19* 28  GLUCOSE 100* 96   < > 118* 90 86  BUN 84* 128*   < > 69* 89* 46*  CREATININE 5.88* 8.49*   < > 8.55* 10.32* 7.01*  CALCIUM 7.7* 8.0*   < > 8.9 8.3* 7.8*  MG 2.4 2.7*  --  2.8*  --   --   PHOS  --  7.8*   < > 10.9* 10.4* 6.6*   < > = values in this interval not displayed.   GFR: Estimated Creatinine Clearance: 10.1 mL/min (A) (by C-G formula based on SCr of 7.01 mg/dL (H)).  Liver Function Tests: Recent Labs  Lab 06/09/18 0339 06/10/18 0230  06/12/18 0718 06/13/18 0155 06/14/18 0241 06/15/18 0548  AST 35 36  --   --  22  --   --   ALT 13 16  --   --  14  --   --   ALKPHOS 107 101  --   --  137*  --   --   BILITOT 0.8 0.7  --   --  0.7  --   --   PROT 5.4* 5.1*  --   --  6.5  --   --   ALBUMIN 2.0*  2.0* 2.0*   < > 2.7* 2.5* 2.3* 2.0*   < > = values in this interval not displayed.    Cardiac Enzymes: Recent Labs  Lab  06/10/18 1910 06/11/18 0244  TROPONINI 0.06* 0.06*    HbA1C: Hgb A1c MFr Bld  Date/Time Value Ref Range Status  05/22/2018 08:27 PM 5.7 (H) 4.8 - 5.6 % Final    Comment:    (  NOTE) Pre diabetes:          5.7%-6.4% Diabetes:              >6.4% Glycemic control for   <7.0% adults with diabetes     CBG: Recent Labs  Lab 06/13/18 2237 06/14/18 0028 06/14/18 0500 06/14/18 1204 06/14/18 1736  GLUCAP 101* 115* 91 121* 105*    No results found for this or any previous visit (from the past 240 hour(s)).   Scheduled Meds: . aspirin  81 mg Oral Daily  . atorvastatin  40 mg Oral q1800  . chlorhexidine  15 mL Mouth Rinse BID  . Chlorhexidine Gluconate Cloth  6 each Topical Q0600  . darbepoetin (ARANESP) injection - DIALYSIS  200 mcg Intravenous Q Fri-HD  . mouth rinse  15 mL Mouth Rinse q12n4p  . multivitamin  1 tablet Oral QHS  . nebivolol  2.5 mg Oral QHS  . pantoprazole  40 mg Oral BID  . pyridostigmine  60 mg Oral Q8H  . QUEtiapine  50 mg Oral BID  . sevelamer carbonate  2.4 g Oral TID WC  . sodium chloride flush  3 mL Intravenous Q12H  . ticagrelor  90 mg Oral BID     LOS: 24 days   Phillips Climes, MD [facility] Office  [phone] Pager - Text Page per Shea Evans  If 7PM-7AM, please contact night-coverage per Amion 06/15/2018, 12:40 PM

## 2018-06-15 NOTE — Progress Notes (Signed)
Physical Therapy Treatment Patient Details Name: Howard Thompson MRN: [id number] DOB: Mar 14, 1966 Today's Date: 06/15/2018    History of Present Illness 52 y.o. male out of hospital cardiac arrest (05/22/18), Intubated 7/27- 8/2.  s/p IJ tunneled HD catheter placed 05/31/18 with bleeding from IJ site. Pt with anoxic encephalopathy.Marland Kitchen PMHx: myasthenia gravis post thymus resection, HTN, anterior STEMI post LAD stent.     PT Comments    Pt con't to be limited by drop in BP and onset of nausea/abdominal pain with mobility. Pt BP 117/63 sitting EOB, BP 94/40 s/p amb of 10' to bathroom. BP 100/62 s/p amb 79' with onset of lightheadedness. Pt overall is improving both functionally and cognitively but could ambulate longer if nausea was under control. Acute PT to cont to follow.    Follow Up Recommendations  CIR;Supervision/Assistance - 24 hour     Equipment Recommendations  Other (comment)    Recommendations for Other Services       Precautions / Restrictions Precautions Precautions: Fall Precaution Comments: suboccipital pressure wound, watch BP Restrictions Weight Bearing Restrictions: No    Mobility  Bed Mobility Overal bed mobility: Needs Assistance Bed Mobility: Supine to Sit     Supine to sit: Min assist;Mod assist     General bed mobility comments: max directional verbal cues due to impaired sequencing, min/modA for trunk elevation for sidelying position  Transfers Overall transfer level: Needs assistance Equipment used: Rolling walker (2 wheeled) Transfers: Sit to/from Stand Sit to Stand: Min assist         General transfer comment: max directional verbal cues for sequencing and pushing up from chair or bed,  minA to power up  Ambulation/Gait Ambulation/Gait assistance: Min assist;+2 safety/equipment Gait Distance (Feet): 60 Feet Assistive device: Rolling walker (2 wheeled) Gait Pattern/deviations: Step-through pattern;Decreased stride length;Staggering  left;Staggering right;Narrow base of support Gait velocity: dec Gait velocity interpretation: <1.8 ft/sec, indicate of risk for recurrent falls General Gait Details: pt slow, c/o nausea, lightheadedness, pt with lateral sway in walker, modA for walker management especially around obstacles, pt with loss of color and required to sit   Stairs             Wheelchair Mobility    Modified Rankin (Stroke Patients Only)       Balance Overall balance assessment: Needs assistance Sitting-balance support: Feet supported;No upper extremity supported Sitting balance-Leahy Scale: Fair     Standing balance support: Single extremity supported Standing balance-Leahy Scale: Poor Standing balance comment: pt stood to perform hygiene s/p BM but required unilateral UE support                            Cognition Arousal/Alertness: Awake/alert Behavior During Therapy: Flat affect Overall Cognitive Status: Impaired/Different from baseline Area of Impairment: Orientation;Attention;Memory;Safety/judgement;Following commands;Awareness;Problem solving                 Orientation Level: Disoriented to;Situation(able to state date with cues for year) Current Attention Level: Sustained Memory: Decreased short-term memory Following Commands: Follows one step commands with increased time;Follows one step commands inconsistently Safety/Judgement: Decreased awareness of safety;Decreased awareness of deficits Awareness: Emergent Problem Solving: Slow processing;Difficulty sequencing;Requires verbal cues;Requires tactile cues        Exercises      General Comments General comments (skin integrity, edema, etc.): assisted pt to bathroom to have a BM, pt c/o nausea and abdominal pain      Pertinent Vitals/Pain Pain Assessment: Faces Faces Pain Scale: Hurts even  more Pain Location: abdomen Pain Descriptors / Indicators: Grimacing Pain Intervention(s): Limited activity within  patient's tolerance    Home Living                      Prior Function            PT Goals (current goals can now be found in the care plan section) Acute Rehab PT Goals Patient Stated Goal: didn't state Time For Goal Achievement: 06/29/18 Potential to Achieve Goals: Fair Progress towards PT goals: Progressing toward goals    Frequency    Min 4X/week      PT Plan Current plan remains appropriate    Co-evaluation              AM-PAC PT "6 Clicks" Daily Activity  Outcome Measure  Difficulty turning over in bed (including adjusting bedclothes, sheets and blankets)?: Unable Difficulty moving from lying on back to sitting on the side of the bed? : Unable Difficulty sitting down on and standing up from a chair with arms (e.g., wheelchair, bedside commode, etc,.)?: A Little Help needed moving to and from a bed to chair (including a wheelchair)?: A Little Help needed walking in hospital room?: A Little Help needed climbing 3-5 steps with a railing? : Total 6 Click Score: 12    End of Session Equipment Utilized During Treatment: Gait belt Activity Tolerance: (limited by nausea and drop in BP) Patient left: in chair;with call bell/phone within reach;with family/visitor present Nurse Communication: Mobility status PT Visit Diagnosis: Unsteadiness on feet (R26.81);Muscle weakness (generalized) (M62.81);Other abnormalities of gait and mobility (R26.89);Other symptoms and signs involving the nervous system (R29.898)     Time: 5631-4970 PT Time Calculation (min) (ACUTE ONLY): 51 min  Charges:  $Gait Training: 8-22 mins $Therapeutic Activity: 23-37 mins                     Kittie Plater, PT, DPT Pager #: [phone] Office #: [phone]    [place] 06/15/2018, 11:01 AM

## 2018-06-15 NOTE — Progress Notes (Addendum)
Subjective: Interval History: Patient endorses hunger and some nausea today. He was able to eat some breakfast. He was noted to have hypotension with PT, while walking to the 56L-893T systolic. He has no other complaints this morning.  No UOP recorded  Objective: Vital signs in last 24 hours: Temp:  [98.2 F (36.8 C)-98.8 F (37.1 C)] 98.2 F (36.8 C) (08/20 0529) Pulse Rate:  [61-85] 61 (08/20 0529) Resp:  [16-18] 16 (08/20 0529) BP: (100-142)/(58-76) 142/67 (08/20 0529) SpO2:  [97 %-99 %] 98 % (08/20 0529) Weight:  [56 kg-57 kg] 57 kg (08/20 0500) Weight change: 1 kg  Intake/Output from previous day: 08/19 0701 - 08/20 0700 In: 180 [P.O.:180] Out: -158  Intake/Output this shift: No intake/output data recorded.  Physical Exam  Constitutional: He is oriented to person, place, and time. He appears well-developed and well-nourished. No distress.  Cardiovascular: Normal rate, regular rhythm and intact distal pulses.  Murmur heard. Pulmonary/Chest: Effort normal and breath sounds normal. No respiratory distress.  Abdominal: Soft. Bowel sounds are normal. He exhibits no distension. There is no tenderness.  Musculoskeletal: He exhibits no edema or deformity.  Neurological: He is alert and oriented to person, place, and time.  Skin: Skin is warm and dry.   Lab Results: Recent Labs    06/14/18 0241 06/15/18 0548  WBC 9.3 6.9  HGB 9.1* 7.9*  HCT 28.4* 25.6*  PLT 172 143*   BMET:  Recent Labs    06/14/18 0241 06/15/18 0548  NA 133* 139  K 4.2 3.5  CL 96* 100  CO2 19* 28  GLUCOSE 90 86  BUN 89* 46*  CREATININE 10.32* 7.01*  CALCIUM 8.3* 7.8*   No results for input(s): PTH in the last 72 hours. Iron Studies: No results for input(s): IRON, TIBC, TRANSFERRIN, FERRITIN in the last 72 hours. CBG (last 3)  Recent Labs    06/14/18 0500 06/14/18 1204 06/14/18 1736  GLUCAP 91 121* 105*     Studies/Results: No results found.  I have reviewed the patient's current  medications.  Assessment/Plan: Acute Renal Failure: Anuric, 2/2 Ischemic and toxic ATN. On Intermittent HD MWF. No recovery of renal function for >3 weeks. Will pursue pemanent access this week if no improvement tomorrow (vein mapping, vascular consult). Phos 6.6. - Aranesp 224mcg/wk, Sevelamer 2.4g TID  - Heparin with HD as patient has had clotting in lines - PTH tomorrow AM   HTN: Normo to Hypertensive laying down this morning. experienced hypotension with PT today, with some lightheadedness and nausea. Will give fluid bolus to improve volume status.  On bystolic for CAD - NS Bolus, 500cc over 1hr  Anemia: Renal Failure, Previous bleeding at HD cath site resolved, and GIB etiology unclear (Heme + 8/16), EGD 8/15 showd duodenitis, no bleed), Hgb 7.9 today from 9.1. - Aranesp 216mcg/wk - Treatment for ?GIB per primary  CAD, MI, S/P Arrest on 7/27 & ICM: Per primary and cardiology MG: Per primary team Substance abuse   LOS: 24 days   Neva Seat 06/15/2018,8:28 AM   Patient seen and examined, agree with above note with above modifications. 52 year old WM found down on 7/27- - cardiac arrest.  Has required renal replacement therapy in some way since 7/29 so today is 3 weeks and one day.  Done 8/16 and 8/19.  no uop recorded  and crt rising off of HD.  Will watch another couple of days but likely will need to pursue perm access and OP dialysis later this week.  Pt with appropriate questions- on aranesp, renvela for phosphate binding.  Will need to check PTH pre HD on Wed.  Needs heparin with HD- has clotted 2 systems today - may need blood with HD tomorrow as well   Corliss Parish, MD

## 2018-06-15 NOTE — Progress Notes (Addendum)
Inpatient Rehabilitation  Met with patient at bedside to continue discussion about team's recommendation for IP Rehab.  Expected length of stay and anticipated caregiver assist.  Shared booklets, insurance verification letter, and answered questions.  He stated that he and his family will reach out to me when they get a plan together maybe later today or tomorrow.  Call if questions.  Plan to update team when I have a decision/plan.    Carmelia Roller., CCC/SLP Admission Coordinator  [facility]  Cell [phone]

## 2018-06-16 ENCOUNTER — Inpatient Hospital Stay (HOSPITAL_COMMUNITY): Payer: No Typology Code available for payment source

## 2018-06-16 DIAGNOSIS — N185 Chronic kidney disease, stage 5: Secondary | ICD-10-CM

## 2018-06-16 DIAGNOSIS — Z01810 Encounter for preprocedural cardiovascular examination: Secondary | ICD-10-CM

## 2018-06-16 LAB — RENAL FUNCTION PANEL
Albumin: 2.1 g/dL — ABNORMAL LOW (ref 3.5–5.0)
Anion gap: 10 (ref 5–15)
BUN: 56 mg/dL — ABNORMAL HIGH (ref 6–20)
CALCIUM: 7.8 mg/dL — AB (ref 8.9–10.3)
CO2: 24 mmol/L (ref 22–32)
Chloride: 102 mmol/L (ref 98–111)
Creatinine, Ser: 8.58 mg/dL — ABNORMAL HIGH (ref 0.61–1.24)
GFR, EST AFRICAN AMERICAN: 7 mL/min — AB (ref >60)
GFR, EST NON AFRICAN AMERICAN: 6 mL/min — AB (ref >60)
Glucose, Bld: 78 mg/dL (ref 70–99)
Phosphorus: 7.2 mg/dL — ABNORMAL HIGH (ref 2.5–4.6)
Potassium: 3.5 mmol/L (ref 3.5–5.1)
SODIUM: 136 mmol/L (ref 135–145)

## 2018-06-16 LAB — CBC
HCT: 26.4 % — ABNORMAL LOW (ref 39.0–52.0)
Hemoglobin: 8.1 g/dL — ABNORMAL LOW (ref 13.0–17.0)
MCH: 30 pg (ref 26.0–34.0)
MCHC: 30.7 g/dL (ref 30.0–36.0)
MCV: 97.8 fL (ref 78.0–100.0)
PLATELETS: 188 10*3/uL (ref 150–400)
RBC: 2.7 MIL/uL — AB (ref 4.22–5.81)
RDW: 21.2 % — AB (ref 11.5–15.5)
WBC: 6.5 10*3/uL (ref 4.0–10.5)

## 2018-06-16 MED ORDER — HEPARIN SODIUM (PORCINE) 1000 UNIT/ML DIALYSIS: 20.0000 [IU]/kg | INTRAMUSCULAR | Status: DC | PRN

## 2018-06-16 NOTE — Progress Notes (Signed)
PT Cancellation Note  Patient Details Name: Howard Thompson MRN: [id number] DOB: 10/09/1966   Cancelled Treatment:    Reason Eval/Treat Not Completed: Patient at procedure or test/unavailable (HD).  Ellamae Sia, PT, DPT Acute Rehabilitation Services  Pager: [place] 06/16/2018, 11:06 AM

## 2018-06-16 NOTE — Consult Note (Signed)
[facility]  Patient name: Howard Thompson MRN: [id number] DOB: 02/25/1966 Sex: male   REQUESTING PROVIDER:    Renal   REASON FOR CONSULT:    Dialysis Access  HISTORY OF PRESENT ILLNESS:   Howard Thompson is a 52 y.o. male, who was admitted for cardiac arrest on 05/22/2018.  He was emergently taken to the Cath Lab and underwent stenting of his LAD.  His most recent ejection fraction has increased from 25 to 45%.  The patient developed acute renal failure and is now on dialysis with a poor prognosis for recovery of his renal function and we have been asked to place permanent access.  He now has a catheter in place.  He is right-handed.  PAST MEDICAL HISTORY    ACS (acute coronary syndrome) (HCC)   ST elevation myocardial infarction (STEMI) (HCC)   Ventricular fibrillation (HCC)   Cardiac arrest (HCC)   Shock ([place])   Elevated LFTs   Acute renal failure (HCC)   History of ETT   Thrombocytopenia (HCC)   Substance abuse ([place])   Cocaine abuse ([place])   Encephalopathy acute   AKI (acute kidney injury) ([place])   Ischemic cardiomyopathy   Acute respiratory failure (HCC)   Hyperlipidemia LDL goal <70   Occult blood in stools   Anemia due to chronic blood loss   Melena   Protein-calorie malnutrition, severe  FAMILY HISTORY   Non-contributory  SOCIAL HISTORY:   Social History   Socioeconomic History  . Marital status: Divorced    Spouse name: Not on file  . Number of children: Not on file  . Years of education: Not on file  . Highest education level: Not on file  Occupational History  . Not on file  Social Needs  . Financial resource strain: Not on file  . Food insecurity:    Worry: Not on file    Inability: Not on file  . Transportation needs:    Medical: Not on file    Non-medical: Not on file  Tobacco Use  . Smoking status: Current Every Day Smoker    Packs/day: 1.00    Types: Cigarettes  .  Smokeless tobacco: Current User  Substance and Sexual Activity  . Alcohol use: Not on file  . Drug use: Not on file  . Sexual activity: Not on file  Lifestyle  . Physical activity:    Days per week: Not on file    Minutes per session: Not on file  . Stress: Not on file  Relationships  . Social connections:    Talks on phone: Not on file    Gets together: Not on file    Attends religious service: Not on file    Active member of club or organization: Not on file    Attends meetings of clubs or organizations: Not on file    Relationship status: Not on file  . Intimate partner violence:    Fear of current or ex partner: Not on file    Emotionally abused: Not on file    Physically abused: Not on file    Forced sexual activity: Not on file  Other Topics Concern  . Not on file  Social History Narrative  . Not on file    ALLERGIES:    No Known Allergies  CURRENT MEDICATIONS:    Current Facility-Administered Medications  Medication Dose Route Frequency Provider Last Rate Last Dose  . 0.9 %  sodium chloride infusion  250 mL Intravenous PRN Hammonds,  Sharyn Blitz, MD 10 mL/hr at 06/05/18 2138    . acetaminophen (TYLENOL) tablet 650 mg  650 mg Oral Q6H PRN Joette Catching T, MD      . albuterol (PROVENTIL) (2.5 MG/3ML) 0.083% nebulizer solution 2.5 mg  2.5 mg Nebulization Q2H PRN Ollis, Brandi L, NP      . aspirin chewable tablet 81 mg  81 mg Oral Daily Brand Males, MD   81 mg at 06/16/18 1321  . atorvastatin (LIPITOR) tablet 40 mg  40 mg Oral q1800 Troy Sine, MD   40 mg at 06/16/18 1805  . Chlorhexidine Gluconate Cloth 2 % PADS 6 each  6 each Topical Q0600 Corliss Parish, MD   6 each at 06/16/18 0533  . Darbepoetin Alfa (ARANESP) injection 200 mcg  200 mcg Intravenous Q Deri Fuelling, MD   200 mcg at 06/11/18 0753  . Gerhardt's butt cream   Topical PRN Justin Mend, MD      . hydrALAZINE (APRESOLINE) injection 10-20 mg  10-20 mg Intravenous Q4H PRN  Brand Males, MD   15 mg at 06/07/18 0210  . loperamide (IMODIUM) capsule 4 mg  4 mg Oral PRN Lovey Newcomer T, NP   4 mg at 06/14/18 0038  . LORazepam (ATIVAN) injection 1-2 mg  1-2 mg Intravenous Q1H PRN Frederik Pear, MD   2 mg at 06/13/18 1546  . MEDLINE mouth rinse  15 mL Mouth Rinse BID Elgergawy, Silver Huguenin, MD   15 mL at 06/16/18 1400  . multivitamin (RENA-VIT) tablet 1 tablet  1 tablet Oral Nile Riggs, MD   1 tablet at 06/15/18 2147  . nebivolol (BYSTOLIC) tablet 2.5 mg  2.5 mg Oral QHS Martinique, Peter M, MD   2.5 mg at 06/15/18 2148  . nitroGLYCERIN (NITROSTAT) SL tablet 0.4 mg  0.4 mg Sublingual Q5 min PRN Frederik Pear, MD   0.4 mg at 06/06/18 2023  . ondansetron (ZOFRAN) injection 4 mg  4 mg Intravenous Q6H PRN Lovey Newcomer T, NP   4 mg at 06/15/18 0856  . pantoprazole (PROTONIX) EC tablet 40 mg  40 mg Oral BID Brand Males, MD   40 mg at 06/16/18 1322  . pyridostigmine (MESTINON) tablet 60 mg  60 mg Oral Q8H Amie Portland, MD   60 mg at 06/16/18 1321  . QUEtiapine (SEROQUEL) tablet 50 mg  50 mg Oral BID Tarry Kos, MD   50 mg at 06/16/18 1322  . sevelamer carbonate (RENVELA) powder PACK 2.4 g  2.4 g Oral TID WC Neva Seat, MD   2.4 g at 06/16/18 1805  . sodium chloride flush (NS) 0.9 % injection 3 mL  3 mL Intravenous Q12H Lorretta Harp, MD   3 mL at 06/16/18 1810  . sodium chloride flush (NS) 0.9 % injection 3 mL  3 mL Intravenous PRN Lorretta Harp, MD      . ticagrelor [facility]) tablet 90 mg  90 mg Oral BID Brand Males, MD   90 mg at 06/16/18 1321    REVIEW OF SYSTEMS:   [X]  denotes positive finding, [ ]  denotes negative finding Cardiac  Comments:  Chest pain or chest pressure: x   Shortness of breath upon exertion:    Short of breath when lying flat:    Irregular heart rhythm:        Vascular    Pain in calf, thigh, or hip brought on by ambulation:    Pain in feet at night that  wakes you up from your sleep:       Blood clot in your veins:    Leg swelling:         Pulmonary    Oxygen at home:    Productive cough:     Wheezing:         Neurologic    Sudden weakness in arms or legs:     Sudden numbness in arms or legs:     Sudden onset of difficulty speaking or slurred speech:    Temporary loss of vision in one eye:     Problems with dizziness:         Gastrointestinal    Blood in stool:  x    Vomited blood:         Genitourinary    Burning when urinating:     Blood in urine:        Psychiatric    Major depression:         Hematologic    Bleeding problems:    Problems with blood clotting too easily:        Skin    Rashes or ulcers:        Constitutional    Fever or chills:     PHYSICAL EXAM:   Vitals:   06/16/18 1200 06/16/18 1230 06/16/18 1257 06/16/18 1320  BP: 116/74 125/74 134/76 (!) 156/59  Pulse: 61 61 62 61  Resp:   16 14  Temp:   97.9 F (36.6 C) 98.5 F (36.9 C)  TempSrc:   Oral Oral  SpO2:   100% 100%  Weight:   56.2 kg   Height:        GENERAL: The patient is a well-nourished male, in no acute distress. The vital signs are documented above. CARDIAC: There is a regular rate and rhythm.  VASCULAR: Palpable left brachial pulse PULMONARY: Nonlabored respirations ABDOMEN: Soft and non-tender with normal pitched bowel sounds.  MUSCULOSKELETAL: There are no major deformities or cyanosis. NEUROLOGIC: No focal weakness or paresthesias are detected. SKIN: There are no ulcers or rashes noted. PSYCHIATRIC: The patient has a normal affect.  STUDIES:   I have ordered and reviewed his vascular lab studies with the following findings:      ASSESSMENT and PLAN   ESRD: I discussed with the patient, proceeding with left arm fistula creation.  When I initially saw him in dialysis, his vein mapping had not been completed.  It is now available for my review.  He appears to be a candidate for a left cephalic vein fistula, possibly a basilic vein fistula on the  left.  Tomorrow I will discuss with him the risks and benefits of the operation including the risk of steal, the need for future interventions, and the risk of not maturity.  He has been tentatively placed on the OR schedule for Friday.  The patient is on Brilinta due to his coronary stent.  This will increase the risk of bleeding.   Annamarie Major, MD [facility] [phone] Pager [phone]

## 2018-06-16 NOTE — Progress Notes (Signed)
PROGRESS NOTE    Howard Thompson  OJJ:[id number] DOB: [birth year] DOA: 05/22/2018 PCP: No primary care provider on file.   Brief Narrative:   52 year old with a history of myasthenia gravis, tobacco abuse, alcohol abuse, cocaine use and cardiac arrest outside of the hospital about 3 weeks ago on 05/22/2018.  Patient was emergently taken to cardiac cath and LAD was stented.  At that time his ejection fraction was 25%.  He was monitored in the ICU and was transferred out.  He did suffer from blood loss anemia therefore required multiple transfusions.  Due to quite a bit of agitation he had to be placed back on Precedex drip and placed in the ICU.  His repeat echocardiogram on 8/14 showed ejection fraction of 45%.  Cardiology has been following.  Had an endoscopy done on 8/15 which showed mild duodenitis without ulceration.  Assessment & Plan:   Active Problems:   ACS (acute coronary syndrome) (HCC)   ST elevation myocardial infarction (STEMI) (HCC)   Ventricular fibrillation (HCC)   Cardiac arrest (HCC)   Shock ([place])   Elevated LFTs   Acute renal failure (HCC)   History of ETT   Thrombocytopenia (HCC)   Substance abuse ([place])   Cocaine abuse ([place])   Encephalopathy acute   AKI (acute kidney injury) ([place])   Ischemic cardiomyopathy   Acute respiratory failure (HCC)   Hyperlipidemia LDL goal <70   Occult blood in stools   Anemia due to chronic blood loss   Melena   Protein-calorie malnutrition, severe  Cardiac arrest likely V. fib Acute coronary syndrome but 100% LAD occlusion Ischemic cardiomyopathy, systolic congestive heart failure -Status post cardiac catheterization on 05/22/2018 with stent placed in LAD.  Currently he is on aspirin and Brilinta for 12 months.  Ejection fraction is slightly recovered in last 2 weeks from 25% to 45%.  Cardiology is following. -Unable to tolerate ACE inhibitor or ARB due to acute kidney injury.  Currently on beta-blocker.  Uptitrate as  necessary.  Acute renal failure now on hemodialysis - Currently seen by nephrology to get routine dialysis.  He had another session this morning.  Currently has temporary catheter in place, plans to have evaluated by vascular surgery per nephrology for more permanent access.  Tobacco use -Counseled to quit using this.  Suspected COPD -Given his history he likely has underlying COPD.  Will need outpatient PFTs.  Bronchodilators APRN  Possible GI Bleed with Melena, resolved  -EGD on 8/15 showed duodenitis without any obvious source of bleeding.  Hemoglobin stable.  Continue to monitor him.  Myasthenia gravis - Currently on Mestinon but patient refusing to take this    DVT prophylaxis: SCDs Code Status: Full code Family Communication: None at bedside Disposition Plan: CIR  Consultants:   Cardiology   Nephro  Vascular pending  Subjective: No acute events overnight per chart. Multiple attempts made to see the patient, but he isnt in his room.   Review of Systems Unable to fully obtain   Objective: Vitals:   06/16/18 1200 06/16/18 1230 06/16/18 1257 06/16/18 1320  BP: 116/74 125/74 134/76 (!) 156/59  Pulse: 61 61 62 61  Resp:   16 14  Temp:   97.9 F (36.6 C) 98.5 F (36.9 C)  TempSrc:   Oral Oral  SpO2:   100% 100%  Weight:   56.2 kg   Height:        Intake/Output Summary (Last 24 hours) at 06/16/2018 1603 Last data filed at 06/16/2018 1440 Gross per  24 hour  Intake 822 ml  Output 0 ml  Net 822 ml   Filed Weights   06/16/18 0546 06/16/18 0830 06/16/18 1257  Weight: 56.1 kg 56.2 kg 56.2 kg    Examination:  Unable to perform.     Data Reviewed:   CBC: Recent Labs  Lab 06/10/18 0230 06/11/18 0244  06/12/18 0718 06/13/18 0155 06/14/18 0241 06/15/18 0548 06/16/18 0423  WBC 6.6 7.4   < > 9.2 9.0 9.3 6.9 6.5  NEUTROABS 4.7 5.7  --   --   --   --   --   --   HGB 7.1* 6.5*   < > 10.9* 10.2* 9.1* 7.9* 8.1*  HCT 21.7* 20.8*   < > 35.3* 32.0* 28.4*  25.6* 26.4*  MCV 90.0 91.6   < > 92.9 92.0 93.4 97.0 97.8  PLT 120* 140*   < > 146* 148* 172 143* 188   < > = values in this interval not displayed.   Basic Metabolic Panel: Recent Labs  Lab 06/10/18 0230  06/11/18 0244 06/12/18 0718 06/13/18 0155 06/14/18 0241 06/15/18 0548 06/16/18 0423  NA 136  --  136 136 136 133* 139 136  K 4.6  --  5.0 3.9 4.2 4.2 3.5 3.5  CL 97*  --  98 98 98 96* 100 102  CO2 27  --  21* 23 22 19* 28 24  GLUCOSE 100*  --  96 131* 118* 90 86 78  BUN 84*  --  128* 49* 69* 89* 46* 56*  CREATININE 5.88*  --  8.49* 6.53* 8.55* 10.32* 7.01* 8.58*  CALCIUM 7.7*  --  8.0* 8.8* 8.9 8.3* 7.8* 7.8*  MG 2.4  --  2.7*  --  2.8*  --   --   --   PHOS  --    < > 7.8* 10.5* 10.9* 10.4* 6.6* 7.2*   < > = values in this interval not displayed.   GFR: Estimated Creatinine Clearance: 8.1 mL/min (A) (by C-G formula based on SCr of 8.58 mg/dL (H)). Liver Function Tests: Recent Labs  Lab 06/10/18 0230  06/12/18 0718 06/13/18 0155 06/14/18 0241 06/15/18 0548 06/16/18 0423  AST 36  --   --  22  --   --   --   ALT 16  --   --  14  --   --   --   ALKPHOS 101  --   --  137*  --   --   --   BILITOT 0.7  --   --  0.7  --   --   --   PROT 5.1*  --   --  6.5  --   --   --   ALBUMIN 2.0*   < > 2.7* 2.5* 2.3* 2.0* 2.1*   < > = values in this interval not displayed.   No results for input(s): LIPASE, AMYLASE in the last 168 hours. No results for input(s): AMMONIA in the last 168 hours. Coagulation Profile: No results for input(s): INR, PROTIME in the last 168 hours. Cardiac Enzymes: Recent Labs  Lab 06/10/18 1910 06/11/18 0244  TROPONINI 0.06* 0.06*   BNP (last 3 results) No results for input(s): PROBNP in the last 8760 hours. HbA1C: No results for input(s): HGBA1C in the last 72 hours. CBG: Recent Labs  Lab 06/13/18 2237 06/14/18 0028 06/14/18 0500 06/14/18 1204 06/14/18 1736  GLUCAP 101* 115* 91 121* 105*   Lipid Profile: No results for input(s): CHOL,  HDL, LDLCALC, TRIG, CHOLHDL, LDLDIRECT in the last 72 hours. Thyroid Function Tests: No results for input(s): TSH, T4TOTAL, FREET4, T3FREE, THYROIDAB in the last 72 hours. Anemia Panel: No results for input(s): VITAMINB12, FOLATE, FERRITIN, TIBC, IRON, RETICCTPCT in the last 72 hours. Sepsis Labs: No results for input(s): PROCALCITON, LATICACIDVEN in the last 168 hours.  No results found for this or any previous visit (from the past 240 hour(s)).       Radiology Studies: No results found.      Scheduled Meds: . aspirin  81 mg Oral Daily  . atorvastatin  40 mg Oral q1800  . Chlorhexidine Gluconate Cloth  6 each Topical Q0600  . darbepoetin (ARANESP) injection - DIALYSIS  200 mcg Intravenous Q Fri-HD  . mouth rinse  15 mL Mouth Rinse BID  . multivitamin  1 tablet Oral QHS  . nebivolol  2.5 mg Oral QHS  . pantoprazole  40 mg Oral BID  . pyridostigmine  60 mg Oral Q8H  . QUEtiapine  50 mg Oral BID  . sevelamer carbonate  2.4 g Oral TID WC  . sodium chloride flush  3 mL Intravenous Q12H  . ticagrelor  90 mg Oral BID   Continuous Infusions: . sodium chloride 250 mL (06/05/18 2138)     LOS: 25 days    I have spent 20 minutes face to face with the patient and on the ward discussing the patients care, assessment, plan and disposition with other care givers. >50% of the time was devoted counseling the patient about the risks and benefits of treatment and coordinating care.     Raylynne Cubbage Arsenio Loader, MD [facility] Pager [phone]   If 7PM-7AM, please contact night-coverage [email or web address] Password [facility] 06/16/2018, 4:03 PM

## 2018-06-16 NOTE — Progress Notes (Signed)
OT Cancellation Note  Patient Details Name: Howard Thompson MRN: [id number] DOB: 1966/08/08   Cancelled Treatment:    Reason Eval/Treat Not Completed: Patient at procedure or test/ unavailable. Pt at HD  [name] 06/16/2018, 12:23 PM

## 2018-06-16 NOTE — Progress Notes (Signed)
Physical Therapy Treatment Patient Details Name: Howard Thompson MRN: [id number] DOB: [birth year] Today's Date: 06/16/2018    History of Present Illness 52 y.o. male out of hospital cardiac arrest (05/22/18), Intubated 7/27- 8/2.  s/p IJ tunneled HD catheter placed 05/31/18 with bleeding from IJ site. Pt with anoxic encephalopathy.Marland Kitchen PMHx: myasthenia gravis post thymus resection, HTN, anterior STEMI post LAD stent.     PT Comments    Patient with decreased activity tolerance this session secondary to dialysis. Ambulating in room with walker and minimal assistance. Complaining of dizziness during mobility, BP 116/72.  Continues to demonstrate improvement in function and cognition. Will progress mobility as tolerated. Continue to recommend comprehensive inpatient rehab (CIR) for post-acute therapy needs.    Follow Up Recommendations  CIR;Supervision/Assistance - 24 hour     Equipment Recommendations  Other (comment)    Recommendations for Other Services       Precautions / Restrictions Precautions Precautions: Fall Precaution Comments: suboccipital pressure wound, watch BP Restrictions Weight Bearing Restrictions: No    Mobility  Bed Mobility Overal bed mobility: Needs Assistance Bed Mobility: Supine to Sit     Supine to sit: Min guard        Transfers Overall transfer level: Needs assistance Equipment used: Rolling walker (2 wheeled) Transfers: Sit to/from Stand Sit to Stand: Min guard         General transfer comment: cues for hand placement and scooting forward to edge of bed. min guard assist from bed and toilet  Ambulation/Gait Ambulation/Gait assistance: Min assist;+2 safety/equipment Gait Distance (Feet): 20 Feet Assistive device: Rolling walker (2 wheeled) Gait Pattern/deviations: Step-through pattern;Decreased stride length;Narrow base of support Gait velocity: dec Gait velocity interpretation: <1.8 ft/sec, indicate of risk for recurrent falls General  Gait Details: patient with lateral sway and crossing over feet during turns, requiring min assist by PT to steady. slow speed   Stairs             Wheelchair Mobility    Modified Rankin (Stroke Patients Only)       Balance Overall balance assessment: Needs assistance Sitting-balance support: Feet supported;No upper extremity supported Sitting balance-Leahy Scale: Fair     Standing balance support: Single extremity supported Standing balance-Leahy Scale: Poor Standing balance comment: required unilateral support and leaning against sink to wash hands                            Cognition Arousal/Alertness: Awake/alert Behavior During Therapy: Flat affect Overall Cognitive Status: Impaired/Different from baseline Area of Impairment: Orientation;Attention;Memory;Safety/judgement;Following commands;Awareness;Problem solving                 Orientation Level: Disoriented to;Situation(able to state date with cues for year) Current Attention Level: Sustained Memory: Decreased short-term memory Following Commands: Follows one step commands with increased time Safety/Judgement: Decreased awareness of safety;Decreased awareness of deficits Awareness: Emergent Problem Solving: Slow processing;Difficulty sequencing;Requires verbal cues;Requires tactile cues General Comments: demonstrating short term memory deficits      Exercises      General Comments        Pertinent Vitals/Pain Pain Assessment: No/denies pain    Home Living                      Prior Function            PT Goals (current goals can now be found in the care plan section) Acute Rehab PT Goals Patient Stated Goal: didn't state Time  For Goal Achievement: 06/29/18 Potential to Achieve Goals: Fair Progress towards PT goals: Progressing toward goals    Frequency    Min 4X/week      PT Plan Current plan remains appropriate    Co-evaluation               AM-PAC PT "6 Clicks" Daily Activity  Outcome Measure  Difficulty turning over in bed (including adjusting bedclothes, sheets and blankets)?: Unable Difficulty moving from lying on back to sitting on the side of the bed? : Unable Difficulty sitting down on and standing up from a chair with arms (e.g., wheelchair, bedside commode, etc,.)?: A Little Help needed moving to and from a bed to chair (including a wheelchair)?: A Little Help needed walking in hospital room?: A Little Help needed climbing 3-5 steps with a railing? : Total 6 Click Score: 12    End of Session Equipment Utilized During Treatment: Gait belt Activity Tolerance: Patient limited by fatigue Patient left: in chair;with call bell/phone within reach;with family/visitor present;with chair alarm set Nurse Communication: Mobility status PT Visit Diagnosis: Unsteadiness on feet (R26.81);Muscle weakness (generalized) (M62.81);Other abnormalities of gait and mobility (R26.89);Other symptoms and signs involving the nervous system (R29.898)     Time: 0254-2706 PT Time Calculation (min) (ACUTE ONLY): 29 min  Charges:  $Therapeutic Activity: 23-37 mins                     Ellamae Sia, PT, DPT Acute Rehabilitation Services  Pager: [phone]    Willy Eddy 06/16/2018, 4:50 PM

## 2018-06-16 NOTE — Progress Notes (Signed)
SLP Cancellation Note  Patient Details Name: Howard Thompson MRN: [id number] DOB: 05-22-1966   Cancelled treatment:       Reason Eval/Treat Not Completed: Patient at procedure or test/unavailable Unable to complete assessment of diet tolerance, as pt is currently off unit in HD. Chart review reveals pt is afebrile, lungs are clear/diminished. Diet has been advanced to renal diet/thin liquids. ST will continue efforts to follow up.  Celia B. Quentin [facility], CCC-SLP Speech Language Pathologist [phone]  Shonna Chock 06/16/2018, 10:23 AM

## 2018-06-16 NOTE — Procedures (Signed)
Patient was seen on dialysis and the procedure was supervised.  BFR 400  Via TDC BP is  122/57.   Patient appears to be tolerating treatment well  Oleva Koo A 06/16/2018

## 2018-06-16 NOTE — Progress Notes (Signed)
OT Cancellation Note  Patient Details Name: Howard Thompson MRN: [id number] DOB: 1966/04/29   Cancelled Treatment:    Reason Eval/Treat Not Completed: Patient at procedure or test/ unavailable (vascular); will follow up as schedule permits.  Howard Thompson, OT Pager [phone] 06/16/2018   Howard Thompson 06/16/2018, 3:55 PM

## 2018-06-16 NOTE — Progress Notes (Addendum)
Subjective: Interval History: Patient is feeling okay today. States he felt better yesterday afternoon after receiving 500cc bolus. No UOP recorded past 24hrs, but patient states he may have put out 100cc yesterday. Running even with HD today   Objective: Vital signs in last 24 hours: Temp:  [97.9 F (36.6 C)-98.2 F (36.8 C)] 98.2 F (36.8 C) (08/21 0830) Pulse Rate:  [55-60] (P) 55 (08/21 1000) Resp:  [13-18] 13 (08/21 0830) BP: (113-143)/(49-75) (P) 122/57 (08/21 1000) SpO2:  [100 %] 100 % (08/21 0546) Weight:  [56.1 kg-56.2 kg] 56.2 kg (08/21 0830) Weight change: 1.61 kg  Intake/Output from previous day: 08/20 0701 - 08/21 0700 In: 960 [P.O.:960] Out: -  Intake/Output this shift: No intake/output data recorded.  Physical Exam  Constitutional: He is oriented to person, place, and time. He appears well-developed and well-nourished. No distress.  Cardiovascular: Normal rate, regular rhythm and intact distal pulses.  Murmur heard. Pulmonary/Chest: Effort normal and breath sounds normal. No respiratory distress.  Abdominal: Soft. Bowel sounds are normal. He exhibits no distension. There is no tenderness.  Musculoskeletal: He exhibits no edema or deformity.  Neurological: He is alert and oriented to person, place, and time.  Skin: Skin is warm and dry.   Lab Results: Recent Labs    06/15/18 0548 06/16/18 0423  WBC 6.9 6.5  HGB 7.9* 8.1*  HCT 25.6* 26.4*  PLT 143* 188   BMET:  Recent Labs    06/15/18 0548 06/16/18 0423  NA 139 136  K 3.5 3.5  CL 100 102  CO2 28 24  GLUCOSE 86 78  BUN 46* 56*  CREATININE 7.01* 8.58*  CALCIUM 7.8* 7.8*   No results for input(s): PTH in the last 72 hours. Iron Studies: No results for input(s): IRON, TIBC, TRANSFERRIN, FERRITIN in the last 72 hours. CBG (last 3)  Recent Labs    06/14/18 0500 06/14/18 1204 06/14/18 1736  GLUCAP 91 121* 105*     Studies/Results: No results found.  I have reviewed the patient's current  medications.  Assessment/Plan: Acute Renal Failure: Anuric. 2/2 Ischemic and toxic ATN s/p Arrest and Cath/Stent. On Intermittent HD MWF. No recovery of renal function for >3 weeks. Will pursue pemanent access. Phos 7.2. - Aranesp 226mcg/wk, Sevelamer 2.4g TID  - Heparin with HD as patient has had clotting in lines - HD today, run even given hypotension on 8/20 - Bilat UE Venous Duplex ordered and Vascular consulted - PTH Today   HTN: Normotensive this morning. On bystolic 2.5mg  qhs for CAD - HD today, run even given hypotension on 8/20  Anemia: Renal Failure, Previous bleeding at HD cath site resolved, and GIB etiology unclear (Heme + 8/16), EGD 8/15 showd duodenitis, no bleed), Hgb stable at 8.1 today. - Aranesp 277mcg/wk - Treatment for ?GIB per primary  CAD, MI, S/P Arrest on 7/27 & ICM: Per primary and cardiology MG: Per primary team Substance abuse   LOS: 25 days   Neva Seat 06/16/2018,11:27 AM   Patient seen and examined, agree with above note with above modifications.52 year old WM no history of renal disease found down on 7/27- - cardiac arrest. Has required renal replacement therapy in some way since 7/29 so today is 3 weeks and 2 days. Done 8/16 and 8/19. no uop recorded - he says maybe 100 and crt rising off of HD. Will go ahead and pursue perm access- consult VVS and OP dialysis as I feel he will need- [place]. Pt with appropriate  questions- on aranesp- s/p feraheme, renvela for phosphate binding. Will need to check PTH pre HD today. Needs heparin with HD- clotted 2 systems on Monday- hgb up to 8.1 so did not give blood with HD today   Corliss Parish, MD

## 2018-06-16 NOTE — Progress Notes (Signed)
Inpatient Rehabilitation  Have not received call from patient or family about IP Rehab.  Patient off unit currently in dialysis, as a result, called spouse Jacqlyn Larsen (they live separately) to continue conversation from last week about anticipated caregiver support.  She states that the patient will discharge to his home and they have not figured out a caregiver or transportation for anticipated HD needs.  Notified her that these factors need to be addresses as a part of his recovery care plan.  Updated nurse case manager and CSW.  Will continue to follow.  Call if questions.  Carmelia Roller., CCC/SLP Admission Coordinator  [place]  Cell [phone]

## 2018-06-16 NOTE — Progress Notes (Signed)
Bilateral upper extremity vein mapping completed.      [facility], RVS 06/16/2018 5:44 pm

## 2018-06-16 NOTE — Progress Notes (Signed)
Inpatient Rehabilitation  Met with patient's separated spouse, Jacqlyn Larsen and sister, Lelan Pons while patient was at procedure.  Discussed expectations for discharge plan/supports for an IP Rehab admission.  They will discuss it tonight and include patient in their decision and follow up with me tomorrow.    Carmelia Roller., CCC/SLP Admission Coordinator  [place]  Cell [phone]

## 2018-06-17 ENCOUNTER — Encounter (HOSPITAL_COMMUNITY): Payer: Self-pay | Admitting: Certified Registered Nurse Anesthetist

## 2018-06-17 LAB — CBC
HCT: 26.5 % — ABNORMAL LOW (ref 39.0–52.0)
Hemoglobin: 8 g/dL — ABNORMAL LOW (ref 13.0–17.0)
MCH: 29.7 pg (ref 26.0–34.0)
MCHC: 30.2 g/dL (ref 30.0–36.0)
MCV: 98.5 fL (ref 78.0–100.0)
PLATELETS: 179 10*3/uL (ref 150–400)
RBC: 2.69 MIL/uL — ABNORMAL LOW (ref 4.22–5.81)
RDW: 20.9 % — AB (ref 11.5–15.5)
WBC: 6.6 10*3/uL (ref 4.0–10.5)

## 2018-06-17 LAB — RENAL FUNCTION PANEL
ALBUMIN: 2.1 g/dL — AB (ref 3.5–5.0)
Anion gap: 7 (ref 5–15)
BUN: 19 mg/dL (ref 6–20)
CALCIUM: 7.8 mg/dL — AB (ref 8.9–10.3)
CO2: 30 mmol/L (ref 22–32)
CREATININE: 4.61 mg/dL — AB (ref 0.61–1.24)
Chloride: 101 mmol/L (ref 98–111)
GFR calc Af Amer: 16 mL/min — ABNORMAL LOW (ref >60)
GFR, EST NON AFRICAN AMERICAN: 13 mL/min — AB (ref >60)
Glucose, Bld: 76 mg/dL (ref 70–99)
PHOSPHORUS: 4 mg/dL (ref 2.5–4.6)
Potassium: 3.2 mmol/L — ABNORMAL LOW (ref 3.5–5.1)
Sodium: 138 mmol/L (ref 135–145)

## 2018-06-17 LAB — MAGNESIUM: Magnesium: 2 mg/dL (ref 1.7–2.4)

## 2018-06-17 MED ORDER — ONDANSETRON 4 MG PO TBDP
4.0000 mg | ORAL_TABLET | Freq: Three times a day (TID) | ORAL | Status: DC | PRN
  Administered 2018-06-17: 4 mg via ORAL
  Filled 2018-06-17: qty 1

## 2018-06-17 MED ORDER — CEFAZOLIN SODIUM-DEXTROSE 2-4 GM/100ML-% IV SOLN
2.0000 g | INTRAVENOUS | Status: DC
  Filled 2018-06-17: qty 100

## 2018-06-17 MED ORDER — CEFAZOLIN SODIUM-DEXTROSE 1-4 GM/50ML-% IV SOLN
1.0000 g | INTRAVENOUS | Status: DC
  Filled 2018-06-17: qty 50

## 2018-06-17 NOTE — Progress Notes (Signed)
Inpatient Rehabilitation  Received call from patient's spouse, Jacqlyn Larsen that she and patient's sister, [name] will be caregivers after CIR they they will care for patient at his home.  Discussed case with Dr. Moshe Cipro and she anticipates readiness from a renal standpoint next week.  Will continue to follow for timing of medical readiness and insurance authorization.  Update case Freight forwarder.  Call if questions.   Carmelia Roller., CCC/SLP Admission Coordinator  [place]  Cell [phone]

## 2018-06-17 NOTE — Progress Notes (Addendum)
Subjective: Interval History: Patient is feeling okay today. Endorses nausea especially with eating, primary is planning to give zofran. No UOP recorded past 24hrs, however patient report about 200-250cc out per day. HD yesterday, ran even, given hypotension the day prior. Seen by vascular surgery, tentatively scheduled for LUE AVF creation on 8/23.  Objective: Vital signs in last 24 hours: Temp:  [97.9 F (36.6 C)-98.9 F (37.2 C)] 98.6 F (37 C) (08/22 0449) Pulse Rate:  [55-64] 57 (08/22 0449) Resp:  [14-20] 20 (08/22 0449) BP: (116-156)/(57-76) 135/70 (08/22 0449) SpO2:  [99 %-100 %] 99 % (08/22 0449) Weight:  [56.2 kg] 56.2 kg (08/21 1257) Weight change: 0.09 kg  Intake/Output from previous day: 08/21 0701 - 08/22 0700 In: 222 [P.O.:222] Out: 0  Intake/Output this shift: No intake/output data recorded.  Physical Exam  Constitutional: He is oriented to person, place, and time. He appears well-developed and well-nourished. No distress.  Cardiovascular: Normal rate, regular rhythm and intact distal pulses.  Murmur heard. Pulmonary/Chest: Effort normal and breath sounds normal. No respiratory distress.  Abdominal: Soft. Bowel sounds are normal. He exhibits no distension. There is no tenderness.  Musculoskeletal: He exhibits no edema or deformity.  Neurological: He is alert and oriented to person, place, and time.  Skin: Skin is warm and dry.   Lab Results: Recent Labs    06/16/18 0423 06/17/18 0240  WBC 6.5 6.6  HGB 8.1* 8.0*  HCT 26.4* 26.5*  PLT 188 179   BMET:  Recent Labs    06/16/18 0423 06/17/18 0240  NA 136 138  K 3.5 3.2*  CL 102 101  CO2 24 30  GLUCOSE 78 76  BUN 56* 19  CREATININE 8.58* 4.61*  CALCIUM 7.8* 7.8*   No results for input(s): PTH in the last 72 hours. Iron Studies: No results for input(s): IRON, TIBC, TRANSFERRIN, FERRITIN in the last 72 hours. CBG (last 3)  Recent Labs    06/14/18 1204 06/14/18 1736  GLUCAP 121* 105*      Studies/Results: No results found.  I have reviewed the patient's current medications.  Assessment/Plan: Acute Renal Failure: Anuric. 2/2 Ischemic and toxic ATN s/p Arrest and Cath/Stent. On Intermittent HD MWF. No recovery of renal function for >3 weeks. K 3.2, Phos and Acid/Base good. Seen by Vascular Surgery, likely LUE AVF creation on 8/23. - Aranesp 218mcg/wk, Sevelamer 2.4g TID  - PTH Today   HTN: Normotensive this morning. On bystolic 2.5mg  qhs for CAD  Anemia: Renal Failure, Previous bleeding at HD cath site resolved, and GIB etiology unclear (Heme + 8/16), EGD 8/15 showd duodenitis, no bleed), Hgb stable at 8.0 today. - Aranesp 275mcg/wk - Treatment for ?GIB per primary  CAD, MI, S/P Arrest on 7/27 & ICM: Per primary and cardiology MG: Per primary team Substance abuse   LOS: 26 days   Neva Seat 06/17/2018,8:34 AM   Patient seen and examined, agree with above note with above modifications.52 year old WM no history of renal disease found down on 7/27- - cardiac arrest. Has required renal replacement therapy in some way since 7/29 so today is 3 weeksand 3 days. Done8/19 and 8/21. no uop recorded - he says maybe 100-200 and crt rising off of HD. Will go ahead and pursue perm access- appreciate VVS- possibly for AVF tomorrow and OP dialysis as I feel he will need- [place]. Pt with appropriate questions- on aranesp- s/p feraheme, renvela for phosphate binding.  PTH pending. Needs heparin with HD- clotted  2 systems on Monday- given that he will have surgery on Friday- can do HD on Saturday and transition him to TTS as that will be his likely OP schedule- it works for rehab as well   Corliss Parish, MD

## 2018-06-17 NOTE — Progress Notes (Signed)
Nutrition Follow-up  DOCUMENTATION CODES:   Severe malnutrition in context of social or environmental circumstances, Underweight  INTERVENTION:   - Magic cup TID with meals, each supplement provides 290 kcal and 9 grams of protein  - Vital Cuisine shake TID with meals, each supplement provides 520 kcal and 22 grams of protein  - Ordered snacks TID between meals  NUTRITION DIAGNOSIS:   Severe Malnutrition related to social / environmental circumstances (polysubstance abuse) as evidenced by severe fat depletion, severe muscle depletion, energy intake < or equal to 50% for > or equal to 1 month.  Ongoing, being addressed via oral nutrition supplements  GOAL:   Patient will meet greater than or equal to 90% of their needs  Progressing  MONITOR:   PO intake, Supplement acceptance, Labs  REASON FOR ASSESSMENT:   Ventilator    ASSESSMENT:   Patient with PMH significant for CAD with prior MI, HTN, cocaine abuse, and myasthenia gravis s/p thymectomy. Presents this admission with after being found next to his mailbox unresponsive, by stander CPR was initiated. Admitted for cardiac arrest presumed in the setting of ST elevation MI and 100% LAD lesion.   8/18 - Cortrak removed per pt request 8/21 - most recent HD, post-dialysis weight 56.2 kg  Discussed pt with RN who reports plan is to provide pt with Zofran 30 minutes before meals to help reduce nausea.  Spoke with pt and sister-in-law at bedside. Pt reports that he "ate the most since the tube has been out" this morning at breakfast which included Fruit Loops cereal, scrambled eggs, and half of a muffin. Per sister-in-law lunch and dinner meals have been ordered and include "mostly bland foods" which they hope will help pt's nausea.  Pt states that the Nepro this morning "made me gag and spit up." Pt is agreeable to receiving snacks in between meals. RD to order.  Discussed continued evaluation of the need for Cortrak feeding  tube to be replaced. Pt is motivated to eat because he does not want to have the tube replaced.  Meal Completion: 20-70%  Most recent HD treatment was yesterday. Vascular following for permanent HD access with tentative placement scheduled on 8/23. Pt will be NPO after midnight.  Medications reviewed and include: Rena-vit daily, 40 mg Protonix BID  Labs reviewed: potassium 3.2 (L), creatinine 4.61 (H), hemoglobin 8.0 (L), HCT 26.5 (L)  No UOP recorded in flowsheets.  Diet Order:   Diet Order            Diet NPO time specified  Diet effective midnight        Diet NPO time specified Except for: Sips with Meds  Diet effective midnight        Diet renal with fluid restriction Fluid restriction: 1200 mL Fluid; Room service appropriate? Yes; Fluid consistency: Thin  Diet effective now              EDUCATION NEEDS:   Not appropriate for education at this time  Skin:  Skin Assessment: Reviewed RN Assessment (closed incision to R chest)  Last BM:  06/15/18  Height:   Ht Readings from Last 1 Encounters:  06/10/18 5\' 10"  (1.778 m)    Weight:   Wt Readings from Last 1 Encounters:  06/16/18 56.2 kg    Ideal Body Weight:  75.5 kg  BMI:  Body mass index is 17.78 kg/m.  Estimated Nutritional Needs:   Kcal:  2200-2400  Protein:  100-125 gm  Fluid:  1 L  Kate Jablonski Ariele Vidrio, MS, RD, LDN Pager: [phone] Weekend/After Hours: [phone]  

## 2018-06-17 NOTE — Progress Notes (Signed)
Progress Note  Patient Name: Howard Thompson Date of Encounter: 06/17/2018  Primary Cardiologist: No primary care provider on file.   Subjective   Patient denies any chest pain or SOB. Nausea improved. Tolerating HD well.   Inpatient Medications    Scheduled Meds: . aspirin  81 mg Oral Daily  . atorvastatin  40 mg Oral q1800  . Chlorhexidine Gluconate Cloth  6 each Topical Q0600  . darbepoetin (ARANESP) injection - DIALYSIS  200 mcg Intravenous Q Fri-HD  . mouth rinse  15 mL Mouth Rinse BID  . multivitamin  1 tablet Oral QHS  . nebivolol  2.5 mg Oral QHS  . pantoprazole  40 mg Oral BID  . pyridostigmine  60 mg Oral Q8H  . QUEtiapine  50 mg Oral BID  . sevelamer carbonate  2.4 g Oral TID WC  . sodium chloride flush  3 mL Intravenous Q12H  . ticagrelor  90 mg Oral BID   Continuous Infusions: . sodium chloride 250 mL (06/05/18 2138)  . [START ON 06/18/2018]  ceFAZolin (ANCEF) IV     PRN Meds: sodium chloride, acetaminophen, albuterol, Gerhardt's butt cream, hydrALAZINE, loperamide, LORazepam, nitroGLYCERIN, ondansetron (ZOFRAN) IV, sodium chloride flush   Vital Signs    Vitals:   06/16/18 1257 06/16/18 1320 06/16/18 2132 06/17/18 0449  BP: 134/76 (!) 156/59 139/66 135/70  Pulse: 62 61 61 (!) 57  Resp: 16 14 20 20   Temp: 97.9 F (36.6 C) 98.5 F (36.9 C) 98.9 F (37.2 C) 98.6 F (37 C)  TempSrc: Oral Oral    SpO2: 100% 100% 99% 99%  Weight: 56.2 kg     Height:        Intake/Output Summary (Last 24 hours) at 06/17/2018 1135 Last data filed at 06/17/2018 1042 Gross per 24 hour  Intake 832 ml  Output 400 ml  Net 432 ml   Filed Weights   06/16/18 0546 06/16/18 0830 06/16/18 1257  Weight: 56.1 kg 56.2 kg 56.2 kg    Telemetry    NSR - Personally Reviewed  ECG    None today- Personally Reviewed  Physical Exam   GEN: No acute distress.   Neck: No JVD Cardiac: RRR, no murmurs, rubs, or gallops. HD catheter in right chest Respiratory: Clear to  auscultation bilaterally. GI: Soft, nontender, non-distended  MS: No edema; No deformity. Neuro:  Nonfocal  Psych: Normal affect   Labs    Chemistry Recent Labs  Lab 06/13/18 0155  06/15/18 0548 06/16/18 0423 06/17/18 0240  NA 136   < > 139 136 138  K 4.2   < > 3.5 3.5 3.2*  CL 98   < > 100 102 101  CO2 22   < > 28 24 30   GLUCOSE 118*   < > 86 78 76  BUN 69*   < > 46* 56* 19  CREATININE 8.55*   < > 7.01* 8.58* 4.61*  CALCIUM 8.9   < > 7.8* 7.8* 7.8*  PROT 6.5  --   --   --   --   ALBUMIN 2.5*   < > 2.0* 2.1* 2.1*  AST 22  --   --   --   --   ALT 14  --   --   --   --   ALKPHOS 137*  --   --   --   --   BILITOT 0.7  --   --   --   --   GFRNONAA 6*   < >  8* 6* 13*  GFRAA 7*   < > 9* 7* 16*  ANIONGAP 16*   < > 11 10 7    < > = values in this interval not displayed.     Hematology Recent Labs  Lab 06/15/18 0548 06/16/18 0423 06/17/18 0240  WBC 6.9 6.5 6.6  RBC 2.64* 2.70* 2.69*  HGB 7.9* 8.1* 8.0*  HCT 25.6* 26.4* 26.5*  MCV 97.0 97.8 98.5  MCH 29.9 30.0 29.7  MCHC 30.9 30.7 30.2  RDW 21.1* 21.2* 20.9*  PLT 143* 188 179    Cardiac Enzymes Recent Labs  Lab 06/10/18 1910 06/11/18 0244  TROPONINI 0.06* 0.06*   No results for input(s): TROPIPOC in the last 168 hours.   BNPNo results for input(s): BNP, PROBNP in the last 168 hours.   DDimer No results for input(s): DDIMER in the last 168 hours.   Radiology    No results found.  Cardiac Studies  Cath: 05/22/18   Ost LAD to Prox LAD lesion is 100% stenosed.  A drug-eluting stent was successfully placed using a STENT SYNERGY DES 3X20.  Post intervention, there is a 0% residual stenosis.  There is severe left ventricular systolic dysfunction.  LV end diastolic pressure is mildly elevated.  The left ventricular ejection fraction is less than 25% by visual estimate.   TTE: 05/23/18  Study Conclusions  - Left ventricle: The cavity size was normal. Systolic function was severely reduced.  The estimated ejection fraction was in the range of 25% to 30%. Severe diffuse hypokinesis with regional variations. There is akinesis of the apicalanterior and apical myocardium. There is akinesis of the midanteroseptal myocardium. There is severe hypokinesis of the inferoseptal myocardium. Features are consistent with a pseudonormal left ventricular filling pattern, with concomitant abnormal relaxation and increased filling pressure (grade 2 diastolic dysfunction). Doppler parameters are consistent with high ventricular filling pressure. - Aortic valve: A bicuspid morphology cannot be excluded; mildly thickened, mildly calcified leaflets. There was mild to moderate regurgitation. Regurgitation pressure half-time: 718 ms. - Mitral valve: There was mild to moderate regurgitation. Valve area by pressure half-time: 2.39 cm^2. - Right ventricle: Systolic function was moderately reduced. - Atrial septum: There was increased thickness of the septum, consistent with lipomatous hypertrophy. - Tricuspid valve: There was mild regurgitation. - Pulmonary arteries: PA peak pressure: 33 mm Hg (S). - Inferior vena cava: The vessel was dilated. The respirophasic diameter changes were blunted (<50%), consistent with elevated central venous pressure. - Pericardium, extracardiac: A small, free-flowing pericardial effusion was identified circumferential to the heart. The fluid had no internal echoes.There was no evidence of hemodynamic compromise.  Impressions:  - severe LV dysfunction with diffuse HK but apica/anteroseptal and apical anterior AK. Recommend definity contrast study to rule out apical thrombus.   ------------------------------------------------------------------- 06/09/2018 ECHO Study Conclusions  - Left ventricle: The cavity size was normal. Systolic function was mildly to moderately reduced. The estimated ejection fraction was in  the range of 40% to 45%. Images were inadequate for LV wall motion assessment. The study is not technically sufficient to allow evaluation of LV diastolic function. - Aortic valve: There was mild regurgitation. - Mitral valve: Calcified annulus. There was mild regurgitation. - Atrial septum: There was increased thickness of the septum, consistent with lipomatous hypertrophy. - Pulmonary arteries: Systolic pressure could not be accurately estimated.  Recommendations: Suggest definity contrast study to evaluate wall motion. Complications: Apical images were extremely painful for the patient due to his broken ribs.  Patient Profile  52 y.o. male 52 y.o. male out of hospital cardiac arrest, prior history of MI several years ago, myasthenia gravis post thymus resection, anterior ST elevation post LAD stent. Extubated on 8/2.   Assessment & Plan    1. Cardiac arrest: Underwent cath with PCI/DES x1 to the pLADon 05/22/18.Plan for DAPT with ASA/Brilinta for 12 months. ASAwasheld in the setting of thrombocytopenia, but has since been resumed.Platelet count improved. Now on bystolic for cardioselectivity and with nitric oxide benefit to reduce vasospasm. He was orthostatic so Bystolic dose reduced to 2.5 mg daily.  On  Crestor. LFTS stable.   2.  ICM:EF noted at 25-30%. Switched IV BB to PO. No room for ARB/ACEi given AKF. Later EF improved to 40-45%. Will reduce beta blocker due to orthostasis.  3. Anoxic encephalopathy: EEG was abnormal but repeat now improving.on Ativan and Seroquel. Much improved.   4. Acute renal failure:Nephrology following. S/p placement of new dialysis catheter right subclavian. HD. Plans for placement of permanent access in the works.   5. Polysubstance abuse:+ for cocaine on admission.   6. Acute blood loss anemia 2/2 to bleeding HD cath and GI bleed:  EGD on 8/15 with mild duodenitis with friability/ no ulceration  Hb 7.9 - had BM today  - appeared normal. No blood seen.   7. Myasthenia gravis  8. Orthostasis. Multifactorial. Medication effects, dialysis, anemia, severe deconditioning. Improved.   Patient is stable and progressing well with therapy. No active cardiac issues at this time. OK to proceed with rehab therapies from our standpoint. We will sign off.  [facility] will sign off.   Medication Recommendations:  As per [facility] Other recommendations (labs, testing, etc):  none Follow up as an outpatient:  Follow up in [facility] office- Dr Gwenlyn Found 2-3 weeks post discharge from Rehab.      For questions or updates, please contact [place] Please consult [email or web address] for contact info under Cardiology/STEMI.      Signed, Torian Thoennes [place], MD  06/17/2018, 11:35 AM

## 2018-06-17 NOTE — Progress Notes (Signed)
Subjective  -   No overnight events   Physical Exam:  Palpable left brachial pulse Respirations nonlabored       Assessment/Plan:    I discussed with the patient and his family that we would proceed with a left arm, likely brachiocephalic fistula tomorrow.  We discussed the risks and benefits of the operation including the risk of not maturity, need for future interventions, and the possibility of a steal syndrome.  We also discussed his risk of bleeding given that we cannot stop his Brilinta.  He will be n.p.o. after midnight.  I am going to have the IV removed from his left arm.  Howard Thompson 06/17/2018 11:58 AM --  Vitals:   06/16/18 2132 06/17/18 0449  BP: 139/66 135/70  Pulse: 61 (!) 57  Resp: 20 20  Temp: 98.9 F (37.2 C) 98.6 F (37 C)  SpO2: 99% 99%    Intake/Output Summary (Last 24 hours) at 06/17/2018 1158 Last data filed at 06/17/2018 1042 Gross per 24 hour  Intake 832 ml  Output 400 ml  Net 432 ml     Laboratory CBC    Component Value Date/Time   WBC 6.6 06/17/2018 0240   HGB 8.0 (L) 06/17/2018 0240   HCT 26.5 (L) 06/17/2018 0240   HCT 22.5 (L) 06/05/2018 1652   PLT 179 06/17/2018 0240    BMET    Component Value Date/Time   NA 138 06/17/2018 0240   K 3.2 (L) 06/17/2018 0240   CL 101 06/17/2018 0240   CO2 30 06/17/2018 0240   GLUCOSE 76 06/17/2018 0240   BUN 19 06/17/2018 0240   CREATININE 4.61 (H) 06/17/2018 0240   CALCIUM 7.8 (L) 06/17/2018 0240   GFRNONAA 13 (L) 06/17/2018 0240   GFRAA 16 (L) 06/17/2018 0240    COAG Lab Results  Component Value Date   INR 1.23 06/04/2018   INR 1.18 06/01/2018   INR 1.14 06/01/2018   No results found for: PTT  Antibiotics Anti-infectives (From admission, onward)   Start     Dose/Rate Route Frequency Ordered Stop   06/18/18 0600  ceFAZolin (ANCEF) IVPB 2g/100 mL premix     2 g 200 mL/hr over 30 Minutes Intravenous On call to O.R. 06/17/18 1106 06/19/18 0559   05/31/18 1432  ceFAZolin  (ANCEF) 2-4 GM/100ML-% IVPB    Note to Pharmacy:  Manuela Neptune   : cabinet override      05/31/18 1432 06/01/18 0244   05/31/18 1430  ceFAZolin (ANCEF) IVPB 2g/100 mL premix     2 g 200 mL/hr over 30 Minutes Intravenous  Once 05/31/18 1428 05/31/18 1611   05/28/18 1600  ceFAZolin (ANCEF) IVPB 2g/100 mL premix     2 g 200 mL/hr over 30 Minutes Intravenous Every 12 hours 05/28/18 1247 05/29/18 2209   05/26/18 1800  piperacillin-tazobactam (ZOSYN) IVPB 3.375 g  Status:  Discontinued     3.375 g 12.5 mL/hr over 240 Minutes Intravenous Every 6 hours 05/26/18 1322 05/28/18 1240   05/26/18 1600  vancomycin (VANCOCIN) IVPB 750 mg/150 ml premix  Status:  Discontinued     750 mg 150 mL/hr over 60 Minutes Intravenous Every 24 hours 05/26/18 1322 05/28/18 1240   05/25/18 1200  piperacillin-tazobactam (ZOSYN) IVPB 3.375 g  Status:  Discontinued     3.375 g 100 mL/hr over 30 Minutes Intravenous Every 6 hours 05/25/18 0855 05/25/18 0948   05/25/18 1200  piperacillin-tazobactam (ZOSYN) IVPB 2.25 g  Status:  Discontinued  2.25 g 100 mL/hr over 30 Minutes Intravenous Every 6 hours 05/25/18 0948 05/26/18 1322   05/24/18 1800  vancomycin (VANCOCIN) IVPB 750 mg/150 ml premix  Status:  Discontinued     750 mg 150 mL/hr over 60 Minutes Intravenous Every 24 hours 05/24/18 1256 05/25/18 0959   05/22/18 2200  vancomycin (VANCOCIN) IVPB 750 mg/150 ml premix  Status:  Discontinued     750 mg 150 mL/hr over 60 Minutes Intravenous Every 12 hours 05/22/18 2040 05/24/18 0900   05/22/18 2100  piperacillin-tazobactam (ZOSYN) IVPB 3.375 g  Status:  Discontinued     3.375 g 12.5 mL/hr over 240 Minutes Intravenous Every 8 hours 05/22/18 2030 05/25/18 0855       V. Leia Alf, M.D. [facility] Office: [phone] Pager:  [phone]

## 2018-06-17 NOTE — Progress Notes (Signed)
  Speech Language Pathology Treatment: Dysphagia  Patient Details Name: Howard Thompson MRN: [id number] DOB: 07-Aug-1966 Today's Date: 06/17/2018 Time: 1450-1510 SLP Time Calculation (min) (ACUTE ONLY): 20 min  Assessment / Plan / Recommendation Clinical Impression  Pt seen at bedside for assessment of tolerance of advanced diet and education. Pt now receiving regular solids and thin liquids. No overt difficulty or s/s aspiration observed or reported. Lungs are clear and pt is afebrile. ST signing off. Please reconsult if needs arise.     HPI HPI: Howard Thompson is a 52 y.o. male admitted on 7/27 s/p unwitnessed out of hospital cardiac arrest, VF, unknown downtime, past medical history of coronary artery disease, myasthenia gravis, hypertension.  Per wife, pt has had hx of dysphagia related to MG which is largely resolved.  Pt was intubated from 7/27 - 8/2.      Howard Thompson - All dysphagia goals met       Recommendations  Diet recommendations: Regular;Thin liquid Liquids provided via: Straw;Cup Medication Administration: Whole meds with liquid Supervision: Patient able to self feed;Intermittent supervision to cue for compensatory strategies Compensations: Minimize environmental distractions;Slow rate;Small sips/bites Postural Changes and/or Swallow Maneuvers: Seated upright 90 degrees;Upright 30-60 min after meal                Oral Care Recommendations: Oral care BID Follow up Recommendations: Inpatient Rehab SLP Visit Diagnosis: Dysphagia, oropharyngeal phase (R13.12) Plan: All goals met       GO               Celia B. Quentin Ore [facility], CCC-SLP Speech Language Pathologist [phone]  Shonna Chock 06/17/2018, 3:22 PM

## 2018-06-17 NOTE — Progress Notes (Signed)
Physical Therapy Treatment Patient Details Name: Howard Thompson MRN: [id number] DOB: [birth year] Today's Date: 06/17/2018    History of Present Illness 52 y.o. male out of hospital cardiac arrest (05/22/18), Intubated 7/27- 8/2.  s/p IJ tunneled HD catheter placed 05/31/18 with bleeding from IJ site. Pt with anoxic encephalopathy.Marland Kitchen PMHx: myasthenia gravis post thymus resection, HTN, anterior STEMI post LAD stent.     PT Comments    Patient progressing well towards PT goals. Improved ambulation distance today however continues to require Min A due to narrow BoS, scissoring and imbalance. When challenged with cognitive tasks, balance worsens and pt not aware of these changes. Also noted to have slow processing and difficulty with multi-tasking. Continues to be great CIR candidate. Highly motivated to return to PLOF. Will follow.   Follow Up Recommendations  CIR;Supervision/Assistance - 24 hour     Equipment Recommendations  None recommended by PT(defer to CIR)    Recommendations for Other Services       Precautions / Restrictions Precautions Precautions: Fall Precaution Comments: suboccipital pressure wound, watch BP Restrictions Weight Bearing Restrictions: No    Mobility  Bed Mobility Overal bed mobility: Needs Assistance Bed Mobility: Supine to Sit     Supine to sit: Min guard     General bed mobility comments: Able to get to EOB without assist. Increased time.   Transfers Overall transfer level: Needs assistance Equipment used: Rolling walker (2 wheeled) Transfers: Sit to/from Stand Sit to Stand: Min guard         General transfer comment: Min guard for safety. Cues for technique and hand placement. Stood from [employer].   Ambulation/Gait Ambulation/Gait assistance: Min assist Gait Distance (Feet): 120 Feet Assistive device: Rolling walker (2 wheeled);None Gait Pattern/deviations: Step-through pattern;Decreased stride length;Narrow base of support;Drifts  right/left Gait velocity: decreased   General Gait Details: Narrow BoS with scissoring of LEs throughout, lateral sway present as well. min assist for balance. balance worsened with cognitive tasks.   Stairs             Wheelchair Mobility    Modified Rankin (Stroke Patients Only)       Balance Overall balance assessment: Needs assistance Sitting-balance support: Feet supported;No upper extremity supported Sitting balance-Leahy Scale: Good Sitting balance - Comments: Able to reach outside BoS and donn socks and shoes with increased time.    Standing balance support: During functional activity Standing balance-Leahy Scale: Poor Standing balance comment: Able to stand statically without UE support, requires Min A for dynamic standing.                             Cognition Arousal/Alertness: Awake/alert Behavior During Therapy: Flat affect Overall Cognitive Status: Impaired/Different from baseline Area of Impairment: Orientation                 Orientation Level: Disoriented to;Situation;Time(able to get month but needs cues for year.) Current Attention Level: Sustained Memory: Decreased short-term memory Following Commands: Follows multi-step commands consistently Safety/Judgement: Decreased awareness of safety;Decreased awareness of deficits Awareness: Emergent Problem Solving: Slow processing;Requires verbal cues;Requires tactile cues General Comments: Difficulty with multi-tasking during functional tasks- able to count down from 100 by 7s with decrease in balance; able to name items in reasonable amount of time.      Exercises      General Comments General comments (skin integrity, edema, etc.): Sister in law present during session. Pt unable to recall discussions had with family in ICU  or specific things he did (able to write out list for BBQ).      Pertinent Vitals/Pain Pain Assessment: No/denies pain    Home Living                       Prior Function            PT Goals (current goals can now be found in the care plan section) Progress towards PT goals: Progressing toward goals    Frequency    Min 4X/week      PT Plan Current plan remains appropriate    Co-evaluation              AM-PAC PT "6 Clicks" Daily Activity  Outcome Measure  Difficulty turning over in bed (including adjusting bedclothes, sheets and blankets)?: None Difficulty moving from lying on back to sitting on the side of the bed? : None Difficulty sitting down on and standing up from a chair with arms (e.g., wheelchair, bedside commode, etc,.)?: A Little Help needed moving to and from a bed to chair (including a wheelchair)?: A Little Help needed walking in hospital room?: A Little Help needed climbing 3-5 steps with a railing? : A Lot 6 Click Score: 19    End of Session Equipment Utilized During Treatment: Gait belt Activity Tolerance: Patient tolerated treatment well Patient left: in bed;with call bell/phone within reach;with family/visitor present Nurse Communication: Mobility status PT Visit Diagnosis: Unsteadiness on feet (R26.81);Muscle weakness (generalized) (M62.81);Other abnormalities of gait and mobility (R26.89);Other symptoms and signs involving the nervous system (R29.898)     Time: 4627-0350 PT Time Calculation (min) (ACUTE ONLY): 27 min  Charges:  $Gait Training: 8-22 mins $Neuromuscular Re-education: 8-22 mins                     [place], [place], [place] 06/17/2018, 1:13 PM

## 2018-06-17 NOTE — Progress Notes (Signed)
PROGRESS NOTE    Howard Thompson  WNI:[id number] DOB: May 13, 1966 DOA: 05/22/2018 PCP: No primary care provider on file.   Brief Narrative:   52 year old with a history of myasthenia gravis, tobacco abuse, alcohol abuse, cocaine use and cardiac arrest outside of the hospital about 3 weeks ago on 05/22/2018.  Patient was emergently taken to cardiac cath and LAD was stented.  At that time his ejection fraction was 25%.  He was monitored in the ICU and was transferred out.  He did suffer from blood loss anemia therefore required multiple transfusions.  Due to quite a bit of agitation he had to be placed back on Precedex drip and placed in the ICU.  His repeat echocardiogram on 8/14 showed ejection fraction of 45%.  Cardiology has been following.  Had an endoscopy done on 8/15 which showed mild duodenitis without ulceration. Patient continues to be on HD, and appears he may be long term HD now. VVS evaluating the patient for long term HD access placement.   Assessment & Plan:   Active Problems:   ACS (acute coronary syndrome) (HCC)   ST elevation myocardial infarction (STEMI) (HCC)   Ventricular fibrillation (HCC)   Cardiac arrest (HCC)   Shock ([place])   Elevated LFTs   Acute renal failure (HCC)   History of ETT   Thrombocytopenia (HCC)   Substance abuse ([place])   Cocaine abuse ([place])   Encephalopathy acute   AKI (acute kidney injury) ([place])   Ischemic cardiomyopathy   Acute respiratory failure (HCC)   Hyperlipidemia LDL goal <70   Occult blood in stools   Anemia due to chronic blood loss   Melena   Protein-calorie malnutrition, severe  Cardiac arrest likely V. fib Acute coronary syndrome but 100% LAD occlusion Ischemic cardiomyopathy, systolic congestive heart failure -Status post cardiac catheterization on 05/22/2018 with stent placed in LAD.  Currently he is on aspirin and Brilinta for 12 months.  Ejection fraction is slightly recovered in last 2 weeks from 25% to 45%.  Cardiology is  following. -Unable to tolerate ACE inhibitor or ARB due to acute kidney injury.  Currently on beta-blocker.  Uptitrate as necessary.  Acute renal failure now on hemodialysis; stable - Nephro is following him and performing HD. VVS consulted who plans on AVF placement tomorrow. I have explained the patient why he needs it but the details will be discussed by the surgeon about risk etc. Cont Aranesp, Renvela  Tobacco use -Counseled to quit using this.  Suspected COPD -Given his history he likely has underlying COPD.  Will need outpatient PFTs.  Bronchodilators APRN  Possible GI Bleed with Melena, resolved  -EGD on 8/15 showed duodenitis without any obvious source of bleeding.  Hemoglobin stable.  Continue to monitor him.  Myasthenia gravis - Currently on Mestinon 60mg  q8hrs but patient refusing to take this  HLD -cont statin.   DVT prophylaxis: SCDs Code Status: Full code Family Communication: family at bedside/.  Disposition Plan: CIR  Consultants:   Cardiology   Nephro  Vascular pending  Subjective: No complaints spoke with him and his family at bedside. Still continues to feel quite weak. Mild intermittent nausea at time.   Review of Systems Unable to fully obtain   Objective: Vitals:   06/16/18 1257 06/16/18 1320 06/16/18 2132 06/17/18 0449  BP: 134/76 (!) 156/59 139/66 135/70  Pulse: 62 61 61 (!) 57  Resp: 16 14 20 20   Temp: 97.9 F (36.6 C) 98.5 F (36.9 C) 98.9 F (37.2 C) 98.6 F (  37 C)  TempSrc: Oral Oral    SpO2: 100% 100% 99% 99%  Weight: 56.2 kg     Height:        Intake/Output Summary (Last 24 hours) at 06/17/2018 1123 Last data filed at 06/17/2018 1042 Gross per 24 hour  Intake 832 ml  Output 400 ml  Net 432 ml   Filed Weights   06/16/18 0546 06/16/18 0830 06/16/18 1257  Weight: 56.1 kg 56.2 kg 56.2 kg    Examination:  General = no fevers, chills, dizziness, malaise, fatigue HEENT/EYES = negative for pain, redness, loss of vision,  double vision, blurred vision, loss of hearing, sore throat, hoarseness, dysphagia Cardiovascular= negative for chest pain, palpitation, murmurs, lower extremity swelling Respiratory/lungs= negative for shortness of breath, cough, hemoptysis, wheezing, mucus production Gastrointestinal= negative for nausea, vomiting,, abdominal pain, melena, hematemesis Genitourinary= negative for Dysuria, Hematuria, Change in Urinary Frequency MSK = Negative for arthralgia, myalgias, Back Pain, Joint swelling  Neurology= Negative for headache, seizures, numbness, tingling  Psychiatry= Negative for anxiety, depression, suicidal and homocidal ideation Allergy/Immunology= Medication/Food allergy as listed  Skin= Negative for Rash, lesions, ulcers, itching   PHYSICAL EXAM: Constitutional: NAD, calm, comfortable, temporal wasting Eyes: PERRL, lids and conjunctivae normal ENMT: Mucous membranes are moist. Posterior pharynx clear of any exudate or lesions.Normal dentition.  Neck: normal, supple, no masses, no thyromegaly. Right chest wall HD catheter in place.  Respiratory: clear to auscultation bilaterally, no wheezing, no crackles. Normal respiratory effort. No accessory muscle use.  Cardiovascular: Regular rate and rhythm, no murmurs / rubs / gallops. No extremity edema. 2+ pedal pulses. No carotid bruits.  Abdomen: no tenderness, no masses palpated. No hepatosplenomegaly. Bowel sounds positive.  Musculoskeletal: no clubbing / cyanosis. No joint deformity upper and lower extremities. Good ROM, no contractures. Normal muscle tone.  Skin: no rashes, lesions, ulcers. No induration Neurologic: CN 2-12 grossly intact. Sensation intact, DTR normal. Strength 4/5 in all 4.  Psychiatric: Normal judgment and insight. Alert and oriented x 3. Normal mood.      Data Reviewed:   CBC: Recent Labs  Lab 06/11/18 0244  06/13/18 0155 06/14/18 0241 06/15/18 0548 06/16/18 0423 06/17/18 0240  WBC 7.4   < > 9.0 9.3 6.9  6.5 6.6  NEUTROABS 5.7  --   --   --   --   --   --   HGB 6.5*   < > 10.2* 9.1* 7.9* 8.1* 8.0*  HCT 20.8*   < > 32.0* 28.4* 25.6* 26.4* 26.5*  MCV 91.6   < > 92.0 93.4 97.0 97.8 98.5  PLT 140*   < > 148* 172 143* 188 179   < > = values in this interval not displayed.   Basic Metabolic Panel: Recent Labs  Lab 06/11/18 0244  06/13/18 0155 06/14/18 0241 06/15/18 0548 06/16/18 0423 06/17/18 0240  NA 136   < > 136 133* 139 136 138  K 5.0   < > 4.2 4.2 3.5 3.5 3.2*  CL 98   < > 98 96* 100 102 101  CO2 21*   < > 22 19* 28 24 30   GLUCOSE 96   < > 118* 90 86 78 76  BUN 128*   < > 69* 89* 46* 56* 19  CREATININE 8.49*   < > 8.55* 10.32* 7.01* 8.58* 4.61*  CALCIUM 8.0*   < > 8.9 8.3* 7.8* 7.8* 7.8*  MG 2.7*  --  2.8*  --   --   --  2.0  PHOS 7.8*   < > 10.9* 10.4* 6.6* 7.2* 4.0   < > = values in this interval not displayed.   GFR: Estimated Creatinine Clearance: 15.1 mL/min (A) (by C-G formula based on SCr of 4.61 mg/dL (H)). Liver Function Tests: Recent Labs  Lab 06/13/18 0155 06/14/18 0241 06/15/18 0548 06/16/18 0423 06/17/18 0240  AST 22  --   --   --   --   ALT 14  --   --   --   --   ALKPHOS 137*  --   --   --   --   BILITOT 0.7  --   --   --   --   PROT 6.5  --   --   --   --   ALBUMIN 2.5* 2.3* 2.0* 2.1* 2.1*   No results for input(s): LIPASE, AMYLASE in the last 168 hours. No results for input(s): AMMONIA in the last 168 hours. Coagulation Profile: No results for input(s): INR, PROTIME in the last 168 hours. Cardiac Enzymes: Recent Labs  Lab 06/10/18 1910 06/11/18 0244  TROPONINI 0.06* 0.06*   BNP (last 3 results) No results for input(s): PROBNP in the last 8760 hours. HbA1C: No results for input(s): HGBA1C in the last 72 hours. CBG: Recent Labs  Lab 06/13/18 2237 06/14/18 0028 06/14/18 0500 06/14/18 1204 06/14/18 1736  GLUCAP 101* 115* 91 121* 105*   Lipid Profile: No results for input(s): CHOL, HDL, LDLCALC, TRIG, CHOLHDL, LDLDIRECT in the last 72  hours. Thyroid Function Tests: No results for input(s): TSH, T4TOTAL, FREET4, T3FREE, THYROIDAB in the last 72 hours. Anemia Panel: No results for input(s): VITAMINB12, FOLATE, FERRITIN, TIBC, IRON, RETICCTPCT in the last 72 hours. Sepsis Labs: No results for input(s): PROCALCITON, LATICACIDVEN in the last 168 hours.  No results found for this or any previous visit (from the past 240 hour(s)).       Radiology Studies: No results found.      Scheduled Meds: . aspirin  81 mg Oral Daily  . atorvastatin  40 mg Oral q1800  . Chlorhexidine Gluconate Cloth  6 each Topical Q0600  . darbepoetin (ARANESP) injection - DIALYSIS  200 mcg Intravenous Q Fri-HD  . mouth rinse  15 mL Mouth Rinse BID  . multivitamin  1 tablet Oral QHS  . nebivolol  2.5 mg Oral QHS  . pantoprazole  40 mg Oral BID  . pyridostigmine  60 mg Oral Q8H  . QUEtiapine  50 mg Oral BID  . sevelamer carbonate  2.4 g Oral TID WC  . sodium chloride flush  3 mL Intravenous Q12H  . ticagrelor  90 mg Oral BID   Continuous Infusions: . sodium chloride 250 mL (06/05/18 2138)  . [START ON 06/18/2018]  ceFAZolin (ANCEF) IV       LOS: 26 days    I have spent 30 minutes face to face with the patient and on the ward discussing the patients care, assessment, plan and disposition with other care givers. >50% of the time was devoted counseling the patient about the risks and benefits of treatment and coordinating care.     Ankit Arsenio Loader, MD [facility] Pager [phone]   If 7PM-7AM, please contact night-coverage [email or web address] Password [facility] 06/17/2018, 11:23 AM

## 2018-06-18 ENCOUNTER — Telehealth: Payer: Self-pay | Admitting: *Deleted

## 2018-06-18 ENCOUNTER — Encounter (HOSPITAL_COMMUNITY)
Admission: EM | Disposition: A | Discharge status: Home Health | Payer: Self-pay | Source: Home / Self Care | Attending: Cardiovascular Disease

## 2018-06-18 DIAGNOSIS — R531 Weakness: Secondary | ICD-10-CM

## 2018-06-18 LAB — CBC
HCT: 27.2 % — ABNORMAL LOW (ref 39.0–52.0)
HEMOGLOBIN: 8.3 g/dL — AB (ref 13.0–17.0)
MCH: 30.4 pg (ref 26.0–34.0)
MCHC: 30.5 g/dL (ref 30.0–36.0)
MCV: 99.6 fL (ref 78.0–100.0)
Platelets: 212 10*3/uL (ref 150–400)
RBC: 2.73 MIL/uL — AB (ref 4.22–5.81)
RDW: 20 % — ABNORMAL HIGH (ref 11.5–15.5)
WBC: 7 10*3/uL (ref 4.0–10.5)

## 2018-06-18 LAB — RENAL FUNCTION PANEL
ANION GAP: 8 (ref 5–15)
Albumin: 2.2 g/dL — ABNORMAL LOW (ref 3.5–5.0)
BUN: 28 mg/dL — ABNORMAL HIGH (ref 6–20)
CHLORIDE: 101 mmol/L (ref 98–111)
CO2: 28 mmol/L (ref 22–32)
Calcium: 8.2 mg/dL — ABNORMAL LOW (ref 8.9–10.3)
Creatinine, Ser: 5.65 mg/dL — ABNORMAL HIGH (ref 0.61–1.24)
GFR calc non Af Amer: 11 mL/min — ABNORMAL LOW (ref >60)
GFR, EST AFRICAN AMERICAN: 12 mL/min — AB (ref >60)
GLUCOSE: 87 mg/dL (ref 70–99)
Phosphorus: 4.4 mg/dL (ref 2.5–4.6)
Potassium: 3.9 mmol/L (ref 3.5–5.1)
Sodium: 137 mmol/L (ref 135–145)

## 2018-06-18 LAB — MAGNESIUM: Magnesium: 2 mg/dL (ref 1.7–2.4)

## 2018-06-18 LAB — PARATHYROID HORMONE, INTACT (NO CA): PTH: 65 pg/mL (ref 15–65)

## 2018-06-18 LAB — PROTIME-INR
INR: 1.02
Prothrombin Time: 13.3 seconds (ref 11.4–15.2)

## 2018-06-18 SURGERY — ARTERIOVENOUS (AV) FISTULA CREATION
Anesthesia: Monitor Anesthesia Care | Laterality: Left

## 2018-06-18 MED ORDER — DARBEPOETIN ALFA 200 MCG/0.4ML IJ SOSY
200.0000 ug | PREFILLED_SYRINGE | INTRAMUSCULAR | Status: DC
  Filled 2018-06-18: qty 0.4

## 2018-06-18 MED ORDER — CHLORHEXIDINE GLUCONATE CLOTH 2 % EX PADS
6.0000 | MEDICATED_PAD | Freq: Every day | CUTANEOUS | Status: DC
  Administered 2018-06-19 – 2018-06-21 (×3): 6 via TOPICAL

## 2018-06-18 NOTE — Progress Notes (Addendum)
Pt at bedside with sister. Both voiced concerns about fistula placement, especially now that patient seems to be voiding more frequently and creatinine improved. Patient would like to hold off on surgical placement of fistula, see if his kidney function will improve, and possibly not need HD in future.  Patient refused IV Ancef for procedure and would like to discuss the above tomorrow.

## 2018-06-18 NOTE — Telephone Encounter (Signed)
Dr. Carlis Abbott is adding a bypass surgery for Monday , so this case has been moved to Wednesday 06-23-18 with Dr. Donnetta Hutching.  Dr. Donzetta Matters is on call over the weekend and is aware of this patient's reconsult request by Dr. Reesa Chew.

## 2018-06-18 NOTE — Progress Notes (Signed)
Inpatient Rehabilitation  Howard Thompson clarified that it is now anticipated patient will be medically ready after procedure on Wednesday; however, now note that patient has progressed and therapy is recommending home with home health.  Will follow up with team again Monday, 06/21/18 if functional gaina are maintained then CIR will not be needed.  Call if questions.   Carmelia Roller., CCC/SLP Admission Coordinator  [place]  Cell [phone]

## 2018-06-18 NOTE — Progress Notes (Addendum)
Physical Therapy Treatment Patient Details Name: Howard Thompson MRN: [id number] DOB: 10-08-1966 Today's Date: 06/18/2018    History of Present Illness 52 y.o. male out of hospital cardiac arrest (05/22/18), Intubated 7/27- 8/2.  s/p IJ tunneled HD catheter placed 05/31/18 with bleeding from IJ site. Pt with anoxic encephalopathy.Marland Kitchen PMHx: myasthenia gravis post thymus resection, HTN, anterior STEMI post LAD stent.     PT Comments    Patient making excellent progress. Scored 18/24 on DGI indicating pt at increased risk for falls with higher level balance tasks. Tolerated backwards walking, head turns, SLS with some difficulty but no overt LOB. Performed gait training without use of DME today and demonstrated wider BoS with cues. Continues to demonstrate cognitive deficits and imbalance. Due to progress, discharge recommendation updated to home with HHPT and 24/7 supervision. Goals updated. Will continue to follow and progress as tolerated.    Follow Up Recommendations  Home health PT;Supervision/Assistance - 24 hour     Equipment Recommendations  None recommended by PT    Recommendations for Other Services       Precautions / Restrictions Precautions Precautions: Fall Restrictions Weight Bearing Restrictions: No    Mobility  Bed Mobility Overal bed mobility: Modified Independent             General bed mobility comments: Sitting EOB upon PT arrival.  Transfers Overall transfer level: Needs assistance Equipment used: Rolling walker (2 wheeled) Transfers: Sit to/from Stand Sit to Stand: Supervision Stand pivot transfers: Min guard       General transfer comment: Supervision for safety due to impulsivity.  Ambulation/Gait Ambulation/Gait assistance: Min guard Gait Distance (Feet): 250 Feet Assistive device: None Gait Pattern/deviations: Step-through pattern;Decreased stride length;Narrow base of support;Drifts right/left Gait velocity: decreased   General Gait  Details: Narrow BoS with scissoring of LEs, close min guard due to drifting to right/left. Balance worsened with cognitive tasks.   Stairs             Wheelchair Mobility    Modified Rankin (Stroke Patients Only)       Balance Overall balance assessment: Needs assistance Sitting-balance support: Feet supported;No upper extremity supported Sitting balance-Leahy Scale: Good     Standing balance support: During functional activity Standing balance-Leahy Scale: Good   Single Leg Stance - Right Leg: 20 Single Leg Stance - Left Leg: 25     Rhomberg - Eyes Opened: 30 Rhomberg - Eyes Closed: 30(with lateral sway) High level balance activites: Backward walking;Direction changes;Turns;Sudden stops;Head turns High Level Balance Comments: Tolerated above with deviations in gait pattern- swaying to right/left, scissoring gait but able to perform appropriate balance reactions to catch self. Standardized Balance Assessment Standardized Balance Assessment : Dynamic Gait Index   Dynamic Gait Index Level Surface: Normal Change in Gait Speed: Mild Impairment Gait with Horizontal Head Turns: Mild Impairment Gait with Vertical Head Turns: Mild Impairment Gait and Pivot Turn: Mild Impairment Step Over Obstacle: Mild Impairment Step Around Obstacles: Normal Steps: Mild Impairment Total Score: 18      Cognition Arousal/Alertness: Awake/alert Behavior During Therapy: Impulsive Overall Cognitive Status: Impaired/Different from baseline Area of Impairment: Memory;Safety/judgement;Problem solving;Awareness;Orientation;Attention                 Orientation Level: Disoriented to;Time Current Attention Level: Selective Memory: Decreased short-term memory Following Commands: Follows one step commands consistently Safety/Judgement: Decreased awareness of deficits Awareness: Emergent Problem Solving: Slow processing General Comments: Able to recall some cognitive tasks from OT  session right before PT session. Able to state month  but not year, "why do I always forget that?"       Exercises      General Comments General comments (skin integrity, edema, etc.): Wife present during session.      Pertinent Vitals/Pain Pain Assessment: No/denies pain    Home Living                      Prior Function            PT Goals (current goals can now be found in the care plan section) Acute Rehab PT Goals Patient Stated Goal: to play golf Progress towards PT goals: Progressing toward goals    Frequency    Min 3X/week      PT Plan Discharge plan needs to be updated;Frequency needs to be updated    Co-evaluation              AM-PAC PT "6 Clicks" Daily Activity  Outcome Measure  Difficulty turning over in bed (including adjusting bedclothes, sheets and blankets)?: None Difficulty moving from lying on back to sitting on the side of the bed? : None Difficulty sitting down on and standing up from a chair with arms (e.g., wheelchair, bedside commode, etc,.)?: None Help needed moving to and from a bed to chair (including a wheelchair)?: None Help needed walking in hospital room?: A Little Help needed climbing 3-5 steps with a railing? : A Little 6 Click Score: 22    End of Session Equipment Utilized During Treatment: Gait belt Activity Tolerance: Patient tolerated treatment well Patient left: in bed;with call bell/phone within reach;with family/visitor present Nurse Communication: Mobility status PT Visit Diagnosis: Unsteadiness on feet (R26.81);Muscle weakness (generalized) (M62.81);Other abnormalities of gait and mobility (R26.89);Other symptoms and signs involving the nervous system (P10.315)     Time: 1435-1455 PT Time Calculation (min) (ACUTE ONLY): 20 min  Charges:  $Neuromuscular Re-education: 8-22 mins                     [place], [place], DPT [phone]     [name] 06/18/2018, 3:09 PM

## 2018-06-18 NOTE — Telephone Encounter (Signed)
Dr. Reesa Chew called to say that the patient has now agreed to have the HD access. I will posted this surgery for Monday 06-21-18 with Dr. Carlis Abbott.

## 2018-06-18 NOTE — Progress Notes (Signed)
Inpatient Rehabilitation  Received call from patient's spouse Jacqlyn Larsen asking about update for discharge.  As a consulting provider I referred her to attending and current care team.  I also await clarification about updated medical status to determine most appropriate post acute rehab venue.  Requested update from nurse case manager, Levada Dy.    Carmelia Roller., CCC/SLP Admission Coordinator  [place]  Cell [phone]

## 2018-06-18 NOTE — Progress Notes (Signed)
Occupational Therapy Treatment Patient Details Name: Howard Thompson MRN: [id number] DOB: [birth year] Today's Date: 06/18/2018    History of present illness 52 y.o. male out of hospital cardiac arrest (05/22/18), Intubated 7/27- 8/2.  s/p IJ tunneled HD catheter placed 05/31/18 with bleeding from IJ site. Pt with anoxic encephalopathy.Marland Kitchen PMHx: myasthenia gravis post thymus resection, HTN, anterior STEMI post LAD stent.    OT comments  Pt has made excellent progress. Pt demonstrates cognitivie deficits in addition to higher level balance deficits, however feel pt will most likely be able to DC home with [facility] , but he will need initial 24/7 S. Will reassess next week. Goals updated.   Follow Up Recommendations  Supervision/Assistance - 24 hour;Home health OT    Equipment Recommendations  3 in 1 bedside commode    Recommendations for Other Services      Precautions / Restrictions Precautions Precautions: Fall       Mobility Bed Mobility Overal bed mobility: Modified Independent                Transfers Overall transfer level: Needs assistance   Transfers: Sit to/from Stand;Stand Pivot Transfers Sit to Stand: Supervision Stand pivot transfers: S            Balance Overall balance assessment: Needs assistance   Sitting balance-Leahy Scale: Good       Standing balance-Leahy Scale: Fair                             ADL either performed or assessed with clinical judgement   ADL Overall ADL's : Needs assistance/impaired Eating/Feeding: Supervision/ safety   Grooming: Supervision/safety;Standing   Upper Body Bathing: Supervision/ safety;Sitting   Lower Body Bathing: Min guard;Sit to/from stand   Upper Body Dressing : Set up;Sitting   Lower Body Dressing: Min guard;Sit to/from stand   Toilet Transfer: Min guard;Ambulation   Toileting- Clothing Manipulation and Hygiene: Modified independent       Functional mobility during ADLs: Cueing for  safety;Cueing for sequencing  Pt completing dynamic activities through session without LOB     Vision   Additional Comments: wears reading lgasses   Perception     Praxis      Cognition Arousal/Alertness: Awake/alert Behavior During Therapy: Impulsive Overall Cognitive Status: Impaired/Different from baseline                     Current Attention Level: Selective Memory: Decreased short-term memory Following Commands: Follows one step commands consistently Safety/Judgement: Decreased awareness of deficits Awareness: Emergent Problem Solving: Slow processing General Comments: Pt able to recall 3 words with immediate recall and after 1 and 5 min delay; Pt given 3 step task to complete - required vc to remember the sequence of 3rd task; During menu task, pt given instrucitons to add up all fat grams from items chosen - pt required mod vc to comlete correctly  Demonstrating anticipatory awareness by looking at towel and saying "well I'll need this for after I brush my teeth". When asked what he likes to do, he states "I have a lot of property and I like to ride side-by-sides, but I know I won't be able to do that for awhile" - increased awareness of deficits/safety      Exercises     Shoulder Instructions       General Comments      Pertinent Vitals/ Pain       Pain Assessment: No/denies pain  Home Living                                          Prior Functioning/Environment              Frequency  Min 2X/week        Progress Toward Goals  OT Goals(current goals can now be found in the care plan section)  Progress towards OT goals: Goals met and updated - see care plan  Acute Rehab OT Goals Patient Stated Goal: to play golf OT Goal Formulation: With patient Time For Goal Achievement: 07/02/18 Potential to Achieve Goals: Good ADL Goals Pt Will Perform Grooming: with modified independence Pt Will Perform Upper Body Dressing: with  modified independence Pt Will Transfer to Toilet: with modified independence Additional ADL Goal #1: Demonstrate anticipatory awareness during ADL taks in moderately distracting environment  Plan Discharge plan remains appropriate    Co-evaluation                 AM-PAC PT "6 Clicks" Daily Activity     Outcome Measure   Help from another person eating meals?: A Little Help from another person taking care of personal grooming?: A Little Help from another person toileting, which includes using toliet, bedpan, or urinal?: A Little Help from another person bathing (including washing, rinsing, drying)?: A Little Help from another person to put on and taking off regular upper body clothing?: A Little Help from another person to put on and taking off regular lower body clothing?: A Little 6 Click Score: 18    End of Session Equipment Utilized During Treatment: Gait belt  OT Visit Diagnosis: Other abnormalities of gait and mobility (R26.89);Other symptoms and signs involving cognitive function   Activity Tolerance Patient tolerated treatment well   Patient Left in bed;with call bell/phone within reach;with bed alarm set;with family/visitor present   Nurse Communication Mobility status        Time: 2035-5974 OT Time Calculation (min): 31 min  Charges: OT General Charges $OT Visit: 1 Visit OT Treatments $Self Care/Home Management : 23-37 mins  Maurie Boettcher, OT/L  OT Clinical Specialist [phone]    [facility] 06/18/2018, 3:02 PM

## 2018-06-18 NOTE — Progress Notes (Signed)
Patient refusing fistula surgery this am.  Please re-consult if he changes his mind   Annamarie Major

## 2018-06-18 NOTE — Progress Notes (Addendum)
Subjective: Interval History: Patient is feeling okay today. Nausea improving (able to eat breakfast). UOP ~400Last 24hrs. Wanted to defer fistula placement for a few days, decided that he does want to go forward with this after all, will see if this can be done early next week. No other complaints.  Objective: Vital signs in last 24 hours: Temp:  [98.6 F (37 C)-99.4 F (37.4 C)] 98.8 F (37.1 C) (08/23 0438) Pulse Rate:  [62-72] 62 (08/23 0438) Resp:  [16-20] 16 (08/23 0438) BP: (133-156)/(58-66) 145/58 (08/23 0438) SpO2:  [99 %-100 %] 99 % (08/23 0438) Weight change:   Intake/Output from previous day: 08/22 0701 - 08/23 0700 In: 850 [P.O.:850] Out: 400 [Urine:400] Intake/Output this shift: No intake/output data recorded.  Physical Exam  Constitutional: He is oriented to person, place, and time. He appears well-developed and well-nourished. No distress.  Cardiovascular: Normal rate, regular rhythm and intact distal pulses.  Murmur heard. Pulmonary/Chest: Effort normal and breath sounds normal. No respiratory distress.  Abdominal: Soft. Bowel sounds are normal. He exhibits no distension. There is no tenderness.  Musculoskeletal: He exhibits no edema or deformity.  Neurological: He is alert and oriented to person, place, and time.  Skin: Skin is warm and dry.    Lab Results: Recent Labs    06/17/18 0240 06/18/18 0540  WBC 6.6 7.0  HGB 8.0* 8.3*  HCT 26.5* 27.2*  PLT 179 212   BMET:  Recent Labs    06/17/18 0240 06/18/18 0540  NA 138 137  K 3.2* 3.9  CL 101 101  CO2 30 28  GLUCOSE 76 87  BUN 19 28*  CREATININE 4.61* 5.65*  CALCIUM 7.8* 8.2*   Recent Labs    06/17/18 0848  PTH 65   Iron Studies: No results for input(s): IRON, TIBC, TRANSFERRIN, FERRITIN in the last 72 hours. CBG (last 3)  No results for input(s): GLUCAP in the last 72 hours.   Studies/Results: No results found.  I have reviewed the patient's current  medications.  Assessment/Plan: Acute Renal Failure: Anuric. 2/2 Ischemic and toxic ATN s/p Arrest and Cath/Stent. On Intermittent HD MWF. No recovery of renal function for >3 weeks. K, Phos and Acid/Base good. Seen by Vascular Surgery, LUE AVF creation was schedule for 8/23, but patient refused this AM, will re-consult. > Spoke with Vascular, tentatively scheduled for Monday - Aranesp 249mcg/wk, Sevelamer 2.4g TID  - PTH 65 (upper limit of normal)  HTN: 536U Systolic this AM. On bystolic 2.5mg  qhs for CAD  Anemia: Renal Failure, Previous bleeding at HD cath site resolved, and GIB etiology unclear (Heme + 8/16), EGD 8/15 showd duodenitis, no bleed), Hgb stable at 8.3 today. - Aranesp 221mcg/wk - Treatment for ?GIB per primary  CAD, MI, S/P Arrest on 7/27 & ICM: Per primary and cardiology MG: Per primary team Substance abuse   LOS: 27 days   Neva Seat 06/18/2018,8:40 AM    Patient seen and examined, agree with above note with above modifications.52 year old WMno history of renal diseasefound down on 7/27- - cardiac arrest. Has required renal replacement therapy in some way since 7/29 so today is 3 weeksand4 days. Done8/19 and 8/21. now there is some uop recorded -  crt still rising off of HD but maybe by less?? l still feel like it is a good idea to go ahead andpursue perm access- appreciate VVS- possibly for AVF Mondayand OP dialysis as I feel he will need- [place]. Pt with appropriate questions- on  aranesp- s/p feraheme, renvela for phosphate binding.  PTH 65. Needs heparin with HD- clotted 2 systems on Monday-  can do HD on Saturday and transition him to TTS as that will be his likely OP schedule-  Will check labs out pre HD tomorrow to assess for recovery   Corliss Parish, MD

## 2018-06-18 NOTE — NC FL2 (Signed)
[place] LEVEL OF CARE SCREENING TOOL     IDENTIFICATION  Patient Name: Howard Thompson Birthdate: [birth year] Sex: male Admission Date (Current Location): 05/22/2018  [facility] and [place] Number:  Herbalist and Address:  [facility], [place] [address] [place], [place] [address]      Provider Number: [id number]  Attending Physician Name and Address:  Damita Lack, MD  Relative Name and Phone Number:  Shirleen Schirmer    Current Level of Care: Hospital Recommended Level of Care: [place] Prior Approval Number:    Date Approved/Denied:   PASRR Number:    Discharge Plan: SNF    Current Diagnoses: Patient Active Problem List   Diagnosis Date Noted  . Protein-calorie malnutrition, severe 06/11/2018  . Melena   . Occult blood in stools   . Anemia due to chronic blood loss   . Hyperlipidemia LDL goal <70   . Acute respiratory failure ([place])   . AKI (acute kidney injury) ([place])   . Ischemic cardiomyopathy   . Encephalopathy acute   . History of ETT   . Thrombocytopenia ([place])   . Substance abuse ([place])   . Cocaine abuse ([place])   . Shock ([place])   . Elevated LFTs   . Acute renal failure ([place])   . Ventricular fibrillation ([place])   . Cardiac arrest ([place])   . ACS (acute coronary syndrome) ([place]) 05/22/2018  . ST elevation myocardial infarction (STEMI) ([place]) 05/22/2018    Orientation RESPIRATION BLADDER Height & Weight     Self, Time, Situation, Place  Normal Continent Weight: 56.2 kg Height:  5\' 10"  (177.8 cm)  BEHAVIORAL SYMPTOMS/MOOD NEUROLOGICAL BOWEL NUTRITION STATUS      Continent Diet(Please see DC Summary)  AMBULATORY STATUS COMMUNICATION OF NEEDS Skin   Limited Assist Verbally Surgical wounds(Closed incision on chest)                       Personal Care Assistance Level of Assistance  Bathing, Feeding, Dressing Bathing Assistance: Limited assistance Feeding assistance: Limited  assistance Dressing Assistance: Limited assistance     Functional Limitations Info  Sight, Hearing, Speech Sight Info: Impaired Hearing Info: Adequate Speech Info: Adequate    SPECIAL CARE FACTORS FREQUENCY  PT (By licensed PT), OT (By licensed OT)     PT Frequency: 5x/week OT Frequency: 3x/week            Contractures Contractures Info: Not present    Additional Factors Info  Code Status, Allergies, Psychotropic Code Status Info: Partial Allergies Info: NKA Psychotropic Info: Seroquel         Current Medications (06/18/2018):  This is the current hospital active medication list Current Facility-Administered Medications  Medication Dose Route Frequency Provider Last Rate Last Dose  . 0.9 %  sodium chloride infusion  250 mL Intravenous PRN Hammonds, Sharyn Blitz, MD 10 mL/hr at 06/05/18 2138    . acetaminophen (TYLENOL) tablet 650 mg  650 mg Oral Q6H PRN Joette Catching T, MD      . albuterol (PROVENTIL) (2.5 MG/3ML) 0.083% nebulizer solution 2.5 mg  2.5 mg Nebulization Q2H PRN Ollis, Brandi L, NP      . aspirin chewable tablet 81 mg  81 mg Oral Daily Brand Males, MD   81 mg at 06/18/18 1016  . atorvastatin (LIPITOR) tablet 40 mg  40 mg Oral q1800 Troy Sine, MD   40 mg at 06/17/18 1827  . ceFAZolin (ANCEF) IVPB  1 g/50 mL premix  1 g Intravenous On Call Serafina Mitchell, MD      . ceFAZolin (ANCEF) IVPB 2g/100 mL premix  2 g Intravenous On Call to OR Dagoberto Ligas, PA-C      . Chlorhexidine Gluconate Cloth 2 % PADS 6 each  6 each Topical Q0600 Corliss Parish, MD   6 each at 06/18/18 0554  . Darbepoetin Alfa (ARANESP) injection 200 mcg  200 mcg Intravenous Q Deri Fuelling, MD   200 mcg at 06/11/18 0753  . Gerhardt's butt cream   Topical PRN Justin Mend, MD      . hydrALAZINE (APRESOLINE) injection 10-20 mg  10-20 mg Intravenous Q4H PRN Brand Males, MD   15 mg at 06/07/18 0210  . loperamide (IMODIUM) capsule 4 mg  4 mg Oral PRN  Lovey Newcomer T, NP   4 mg at 06/14/18 0038  . LORazepam (ATIVAN) injection 1-2 mg  1-2 mg Intravenous Q1H PRN Frederik Pear, MD   2 mg at 06/13/18 1546  . MEDLINE mouth rinse  15 mL Mouth Rinse BID Elgergawy, Silver Huguenin, MD   15 mL at 06/17/18 1039  . multivitamin (RENA-VIT) tablet 1 tablet  1 tablet Oral Nile Riggs, MD   1 tablet at 06/17/18 2342  . nebivolol (BYSTOLIC) tablet 2.5 mg  2.5 mg Oral QHS Martinique, Peter M, MD   2.5 mg at 06/17/18 2342  . nitroGLYCERIN (NITROSTAT) SL tablet 0.4 mg  0.4 mg Sublingual Q5 min PRN Frederik Pear, MD   0.4 mg at 06/06/18 2023  . ondansetron (ZOFRAN) injection 4 mg  4 mg Intravenous Q6H PRN Lovey Newcomer T, NP   4 mg at 06/17/18 1334  . ondansetron (ZOFRAN-ODT) disintegrating tablet 4 mg  4 mg Oral Q8H PRN Vita Erm, NP   4 mg at 06/17/18 2309  . pantoprazole (PROTONIX) EC tablet 40 mg  40 mg Oral BID Brand Males, MD   40 mg at 06/18/18 1015  . pyridostigmine (MESTINON) tablet 60 mg  60 mg Oral Q8H Amie Portland, MD   60 mg at 06/17/18 0522  . QUEtiapine (SEROQUEL) tablet 50 mg  50 mg Oral BID Tarry Kos, MD   50 mg at 06/18/18 1016  . sevelamer carbonate (RENVELA) powder PACK 2.4 g  2.4 g Oral TID WC Neva Seat, MD   2.4 g at 06/18/18 1016  . sodium chloride flush (NS) 0.9 % injection 3 mL  3 mL Intravenous Q12H Lorretta Harp, MD   3 mL at 06/18/18 1017  . sodium chloride flush (NS) 0.9 % injection 3 mL  3 mL Intravenous PRN Lorretta Harp, MD      . ticagrelor [facility]) tablet 90 mg  90 mg Oral BID Brand Males, MD   90 mg at 06/18/18 1016     Discharge Medications: Please see discharge summary for a list of discharge medications.  Relevant Imaging Results:  Relevant Lab Results:   Additional Information [facility] at [facility] in [place], [place]

## 2018-06-18 NOTE — Progress Notes (Signed)
PROGRESS NOTE    Howard Thompson  YQI:[id number] DOB: [birth year] DOA: 05/22/2018 PCP: No primary care provider on file.   Brief Narrative:   52 year old with a history of myasthenia gravis, tobacco abuse, alcohol abuse, cocaine use and cardiac arrest outside of the hospital about 3 weeks ago on 05/22/2018.  Patient was emergently taken to cardiac cath and LAD was stented.  At that time his ejection fraction was 25%.  He was monitored in the ICU and was transferred out.  He did suffer from blood loss anemia therefore required multiple transfusions.  Due to quite a bit of agitation he had to be placed back on Precedex drip and placed in the ICU.  His repeat echocardiogram on 8/14 showed ejection fraction of 45%.  Cardiology has been following.  Had an endoscopy done on 8/15 which showed mild duodenitis without ulceration. Patient continues to be on HD, and appears he may be long term HD now. VVS evaluating the patient for long term HD access placement but patient refusing to have this done as he is hoping his renal function to return.   Assessment & Plan:   Active Problems:   ACS (acute coronary syndrome) (HCC)   ST elevation myocardial infarction (STEMI) (HCC)   Ventricular fibrillation (HCC)   Cardiac arrest (HCC)   Shock ([place])   Elevated LFTs   Acute renal failure (HCC)   History of ETT   Thrombocytopenia (HCC)   Substance abuse ([place])   Cocaine abuse ([place])   Encephalopathy acute   AKI (acute kidney injury) ([place])   Ischemic cardiomyopathy   Acute respiratory failure (HCC)   Hyperlipidemia LDL goal <70   Occult blood in stools   Anemia due to chronic blood loss   Melena   Protein-calorie malnutrition, severe  Cardiac arrest likely V. Fib; stable.  Acute coronary syndrome but 100% LAD occlusion Ischemic cardiomyopathy, systolic congestive heart failure -Status post cardiac catheterization on 05/22/2018 with stent placed in LAD.  Currently he is on aspirin and Brilinta for 12 months.   Ejection fraction is slightly recovered in last 2 weeks from 25% to 45%.  Cardiology is following. -Unable to tolerate ACE inhibitor or ARB due to acute kidney injury.  Currently on beta-blocker.  Uptitrate as necessary.  Acute renal failure now on hemodialysis; stable - [facility] is following and patient is getting routine HD. Vascular offered AVF placement but patient and sister doesn't want this done at this time. Wants to wait for another week or two.   Tobacco use -Counseled to quit using this.  Suspected COPD -Given his history he likely has underlying COPD.  Will need outpatient PFTs.  Bronchodilators APRN  Possible GI Bleed with Melena, resolved  -EGD on 8/15 showed duodenitis without any obvious source of bleeding.  Hemoglobin stable.  Continue to monitor him.  Myasthenia gravis - Currently on Mestinon 60mg  q8hrs but patient refusing to take this  HLD -cont statin.   Patient needs CIR.   DVT prophylaxis: SCDs Code Status: Full code Family Communication: Sister at bedside.  Disposition Plan: CIR when bed available.   Consultants:   Cardiology   Nephro  Vascular   Subjective: Patient is refusing to have AVF placed, states they want to think about it. All of their questions have been answered. No other complaints.   Review of Systems General = no fevers, chills, dizziness, malaise, fatigue HEENT/EYES = negative for pain, redness, loss of vision, double vision, blurred vision, loss of hearing, sore throat, hoarseness, dysphagia Cardiovascular= negative  for chest pain, palpitation, murmurs, lower extremity swelling Respiratory/lungs= negative for shortness of breath, cough, hemoptysis, wheezing, mucus production Gastrointestinal= negative for nausea, vomiting,, abdominal pain, melena, hematemesis Genitourinary= negative for Dysuria, Hematuria, Change in Urinary Frequency MSK = Negative for arthralgia, myalgias, Back Pain, Joint swelling  Neurology= Negative for  headache, seizures, numbness, tingling  Psychiatry= Negative for anxiety, depression, suicidal and homocidal ideation Allergy/Immunology= Medication/Food allergy as listed  Skin= Negative for Rash, lesions, ulcers, itching   Objective: Vitals:   06/17/18 0449 06/17/18 1308 06/17/18 2201 06/18/18 0438  BP: 135/70 133/66 (!) 156/63 (!) 145/58  Pulse: (!) 57 66 72 62  Resp: 20 20 18 16   Temp: 98.6 F (37 C) 99.4 F (37.4 C) 98.6 F (37 C) 98.8 F (37.1 C)  TempSrc:  Oral Oral Oral  SpO2: 99% 100% 100% 99%  Weight:      Height:        Intake/Output Summary (Last 24 hours) at 06/18/2018 1111 Last data filed at 06/18/2018 0644 Gross per 24 hour  Intake 240 ml  Output -  Net 240 ml   Filed Weights   06/16/18 0546 06/16/18 0830 06/16/18 1257  Weight: 56.1 kg 56.2 kg 56.2 kg    Examination:  Constitutional: NAD, calm, comfortable Eyes: PERRL, lids and conjunctivae normal ENMT: Mucous membranes are moist. Posterior pharynx clear of any exudate or lesions.Normal dentition.  Neck: normal, supple, no masses, no thyromegaly. Right chest wall HD catheter in place.  Respiratory: clear to auscultation bilaterally, no wheezing, no crackles. Normal respiratory effort. No accessory muscle use.  Cardiovascular: Regular rate and rhythm, no murmurs / rubs / gallops. No extremity edema. 2+ pedal pulses. No carotid bruits.  Abdomen: no tenderness, no masses palpated. No hepatosplenomegaly. Bowel sounds positive.  Musculoskeletal: no clubbing / cyanosis. No joint deformity upper and lower extremities. Good ROM, no contractures. Normal muscle tone.  Skin: no rashes, lesions, ulcers. No induration Neurologic: CN 2-12 grossly intact. Sensation intact, DTR normal. Strength 4/5 in all 4.  Psychiatric: Normal judgment and insight. Alert and oriented x 3. Normal mood.     Data Reviewed:   CBC: Recent Labs  Lab 06/14/18 0241 06/15/18 0548 06/16/18 0423 06/17/18 0240 06/18/18 0540  WBC 9.3  6.9 6.5 6.6 7.0  HGB 9.1* 7.9* 8.1* 8.0* 8.3*  HCT 28.4* 25.6* 26.4* 26.5* 27.2*  MCV 93.4 97.0 97.8 98.5 99.6  PLT 172 143* 188 179 062   Basic Metabolic Panel: Recent Labs  Lab 06/13/18 0155 06/14/18 0241 06/15/18 0548 06/16/18 0423 06/17/18 0240 06/18/18 0540  NA 136 133* 139 136 138 137  K 4.2 4.2 3.5 3.5 3.2* 3.9  CL 98 96* 100 102 101 101  CO2 22 19* 28 24 30 28   GLUCOSE 118* 90 86 78 76 87  BUN 69* 89* 46* 56* 19 28*  CREATININE 8.55* 10.32* 7.01* 8.58* 4.61* 5.65*  CALCIUM 8.9 8.3* 7.8* 7.8* 7.8* 8.2*  MG 2.8*  --   --   --  2.0 2.0  PHOS 10.9* 10.4* 6.6* 7.2* 4.0 4.4   GFR: Estimated Creatinine Clearance: 12.3 mL/min (A) (by C-G formula based on SCr of 5.65 mg/dL (H)). Liver Function Tests: Recent Labs  Lab 06/13/18 0155 06/14/18 0241 06/15/18 0548 06/16/18 0423 06/17/18 0240 06/18/18 0540  AST 22  --   --   --   --   --   ALT 14  --   --   --   --   --   ALKPHOS 137*  --   --   --   --   --  BILITOT 0.7  --   --   --   --   --   PROT 6.5  --   --   --   --   --   ALBUMIN 2.5* 2.3* 2.0* 2.1* 2.1* 2.2*   No results for input(s): LIPASE, AMYLASE in the last 168 hours. No results for input(s): AMMONIA in the last 168 hours. Coagulation Profile: Recent Labs  Lab 06/18/18 0540  INR 1.02   Cardiac Enzymes: No results for input(s): CKTOTAL, CKMB, CKMBINDEX, TROPONINI in the last 168 hours. BNP (last 3 results) No results for input(s): PROBNP in the last 8760 hours. HbA1C: No results for input(s): HGBA1C in the last 72 hours. CBG: Recent Labs  Lab 06/13/18 2237 06/14/18 0028 06/14/18 0500 06/14/18 1204 06/14/18 1736  GLUCAP 101* 115* 91 121* 105*   Lipid Profile: No results for input(s): CHOL, HDL, LDLCALC, TRIG, CHOLHDL, LDLDIRECT in the last 72 hours. Thyroid Function Tests: No results for input(s): TSH, T4TOTAL, FREET4, T3FREE, THYROIDAB in the last 72 hours. Anemia Panel: No results for input(s): VITAMINB12, FOLATE, FERRITIN, TIBC, IRON,  RETICCTPCT in the last 72 hours. Sepsis Labs: No results for input(s): PROCALCITON, LATICACIDVEN in the last 168 hours.  No results found for this or any previous visit (from the past 240 hour(s)).       Radiology Studies: No results found.      Scheduled Meds: . aspirin  81 mg Oral Daily  . atorvastatin  40 mg Oral q1800  . Chlorhexidine Gluconate Cloth  6 each Topical Q0600  . darbepoetin (ARANESP) injection - DIALYSIS  200 mcg Intravenous Q Fri-HD  . mouth rinse  15 mL Mouth Rinse BID  . multivitamin  1 tablet Oral QHS  . nebivolol  2.5 mg Oral QHS  . pantoprazole  40 mg Oral BID  . pyridostigmine  60 mg Oral Q8H  . QUEtiapine  50 mg Oral BID  . sevelamer carbonate  2.4 g Oral TID WC  . sodium chloride flush  3 mL Intravenous Q12H  . ticagrelor  90 mg Oral BID   Continuous Infusions: . sodium chloride 250 mL (06/05/18 2138)  .  ceFAZolin (ANCEF) IV    .  ceFAZolin (ANCEF) IV       LOS: 27 days    I have spent 40 minutes face to face with the patient and on the ward discussing the patients care, assessment, plan and disposition with other care givers. >50% of the time was devoted counseling the patient about the risks and benefits of treatment and coordinating care.     Ankit Arsenio Loader, MD [facility] Pager [phone]   If 7PM-7AM, please contact night-coverage [email or web address] Password [email or web address] 06/18/2018, 11:11 AM

## 2018-06-19 LAB — RENAL FUNCTION PANEL
ANION GAP: 10 (ref 5–15)
Albumin: 2.1 g/dL — ABNORMAL LOW (ref 3.5–5.0)
BUN: 44 mg/dL — ABNORMAL HIGH (ref 6–20)
CO2: 25 mmol/L (ref 22–32)
Calcium: 8 mg/dL — ABNORMAL LOW (ref 8.9–10.3)
Chloride: 102 mmol/L (ref 98–111)
Creatinine, Ser: 5.35 mg/dL — ABNORMAL HIGH (ref 0.61–1.24)
GFR, EST AFRICAN AMERICAN: 13 mL/min — AB (ref >60)
GFR, EST NON AFRICAN AMERICAN: 11 mL/min — AB (ref >60)
Glucose, Bld: 96 mg/dL (ref 70–99)
Phosphorus: 3.8 mg/dL (ref 2.5–4.6)
Potassium: 3.2 mmol/L — ABNORMAL LOW (ref 3.5–5.1)
Sodium: 137 mmol/L (ref 135–145)

## 2018-06-19 LAB — MAGNESIUM: MAGNESIUM: 1.7 mg/dL (ref 1.7–2.4)

## 2018-06-19 NOTE — Progress Notes (Signed)
PROGRESS NOTE    Howard Thompson  XIP:[id number] DOB: 1966-04-30 DOA: 05/22/2018 PCP: No primary care provider on file.   Brief Narrative:   52 year old with a history of myasthenia gravis, tobacco abuse, alcohol abuse, cocaine use and cardiac arrest outside of the hospital about 3 weeks ago on 05/22/2018.  Patient was emergently taken to cardiac cath and LAD was stented.  At that time his ejection fraction was 25%.  He was monitored in the ICU and was transferred out.  He did suffer from blood loss anemia therefore required multiple transfusions.  Due to quite a bit of agitation he had to be placed back on Precedex drip and placed in the ICU.  His repeat echocardiogram on 8/14 showed ejection fraction of 45%.  Cardiology has been following.  Had an endoscopy done on 8/15 which showed mild duodenitis without ulceration. Patient continues to be on HD, and appears he may be long term HD now. VVS evaluating the patient for long term HD access placement but patient refusing to have this done as he is hoping his renal function to return.  After patient working with physical therapy in the hospital is functioning well improved therefore recommendations from CIR with change to home physical therapy.  Assessment & Plan:   Active Problems:   ACS (acute coronary syndrome) (HCC)   ST elevation myocardial infarction (STEMI) (HCC)   Ventricular fibrillation (HCC)   Cardiac arrest (HCC)   Shock ([place])   Elevated LFTs   Acute renal failure (HCC)   History of ETT   Thrombocytopenia (HCC)   Substance abuse ([place])   Cocaine abuse ([place])   Encephalopathy acute   AKI (acute kidney injury) ([place])   Ischemic cardiomyopathy   Acute respiratory failure (HCC)   Hyperlipidemia LDL goal <70   Occult blood in stools   Anemia due to chronic blood loss   Melena   Protein-calorie malnutrition, severe  Cardiac arrest likely V. Fib; stable Acute coronary syndrome but 100% LAD occlusion Ischemic cardiomyopathy,  systolic congestive heart failure -Status post cardiac catheterization on 05/22/2018 with stent placed in LAD.  Currently he is on aspirin and Brilinta for 12 months.  Ejection fraction is slightly recovered in last 2 weeks from 25% to 45%.  Chest pain-free.  Cardiology has signed off -Unable to tolerate ACE inhibitor or ARB due to acute kidney injury.  Currently on beta-blocker.  Uptitrate as necessary.  Acute renal failure now on hemodialysis; stable - Neprho is following and patient is getting routine HD. Vascular offered AVF placement but patient and sister doesn't want this done at this time. Wants to wait for another week or two. Awaiting CLIP  Generalized weakness -This is improved.  Patient initially was not to go to CIR but now will likely go home with home health.  Tobacco use -Counseled to quit using this.  Suspected COPD -Given his history he likely has underlying COPD.  Will need outpatient PFTs.  Bronchodilators APRN  Possible GI Bleed with Melena, resolved  -EGD on 8/15 showed duodenitis without any obvious source of bleeding.  Hemoglobin stable.  Continue to monitor him.  Myasthenia gravis - Currently on Mestinon 60mg  q8hrs but patient refusing to take this  HLD -cont statin.    DVT prophylaxis: SCDs Code Status: Full code Family Communication: Sister at bedside.  Disposition Plan: Home with home health.  Prior to this clip process  Consultants:   Cardiology   Nephro  Vascular   Subjective: No acute events overnight.  Patient continues  to do better and has been ambulating more in the hallway.  He has shown quite a bit of improvement,   Review of Systems General = no fevers, chills, dizziness, malaise, fatigue HEENT/EYES = negative for pain, redness, loss of vision, double vision, blurred vision, loss of hearing, sore throat, hoarseness, dysphagia Cardiovascular= negative for chest pain, palpitation, murmurs, lower extremity swelling Respiratory/lungs=  negative for shortness of breath, cough, hemoptysis, wheezing, mucus production Gastrointestinal= negative for nausea, vomiting,, abdominal pain, melena, hematemesis Genitourinary= negative for Dysuria, Hematuria, Change in Urinary Frequency MSK = Negative for arthralgia, myalgias, Back Pain, Joint swelling  Neurology= Negative for headache, seizures, numbness, tingling  Psychiatry= Negative for anxiety, depression, suicidal and homocidal ideation Allergy/Immunology= Medication/Food allergy as listed  Skin= Negative for Rash, lesions, ulcers, itching    Objective: Vitals:   06/18/18 1544 06/18/18 2107 06/18/18 2209 06/19/18 0546  BP: (!) 147/69 (!) 102/91 (!) 151/72 (!) 146/76  Pulse: 72 70 70 64  Resp:      Temp: 98.6 F (37 C) 97.6 F (36.4 C)  97.7 F (36.5 C)  TempSrc: Oral Oral  Oral  SpO2: 100% 100%  100%  Weight:    57.7 kg  Height:        Intake/Output Summary (Last 24 hours) at 06/19/2018 1035 Last data filed at 06/18/2018 1930 Gross per 24 hour  Intake -  Output 650 ml  Net -650 ml   Filed Weights   06/16/18 0830 06/16/18 1257 06/19/18 0546  Weight: 56.2 kg 56.2 kg 57.7 kg    Examination:  Constitutional: NAD, calm, comfortable Eyes: PERRL, lids and conjunctivae normal ENMT: Mucous membranes are moist. Posterior pharynx clear of any exudate or lesions.Normal dentition.  Neck: normal, supple, no masses, no thyromegaly.  Left chest wall dialysis catheter is in place Respiratory: clear to auscultation bilaterally, no wheezing, no crackles. Normal respiratory effort. No accessory muscle use.  Cardiovascular: Regular rate and rhythm, no murmurs / rubs / gallops. No extremity edema. 2+ pedal pulses. No carotid bruits.  Abdomen: no tenderness, no masses palpated. No hepatosplenomegaly. Bowel sounds positive.  Musculoskeletal: no clubbing / cyanosis. No joint deformity upper and lower extremities. Good ROM, no contractures. Normal muscle tone.  Skin: no rashes,  lesions, ulcers. No induration Neurologic: CN 2-12 grossly intact. Sensation intact, DTR normal. Strength 4+/5 in all 4.  Psychiatric: Normal judgment and insight. Alert and oriented x 3. Normal mood.      Data Reviewed:   CBC: Recent Labs  Lab 06/14/18 0241 06/15/18 0548 06/16/18 0423 06/17/18 0240 06/18/18 0540  WBC 9.3 6.9 6.5 6.6 7.0  HGB 9.1* 7.9* 8.1* 8.0* 8.3*  HCT 28.4* 25.6* 26.4* 26.5* 27.2*  MCV 93.4 97.0 97.8 98.5 99.6  PLT 172 143* 188 179 244   Basic Metabolic Panel: Recent Labs  Lab 06/13/18 0155  06/15/18 0548 06/16/18 0423 06/17/18 0240 06/18/18 0540 06/19/18 0429  NA 136   < > 139 136 138 137 137  K 4.2   < > 3.5 3.5 3.2* 3.9 3.2*  CL 98   < > 100 102 101 101 102  CO2 22   < > 28 24 30 28 25   GLUCOSE 118*   < > 86 78 76 87 96  BUN 69*   < > 46* 56* 19 28* 44*  CREATININE 8.55*   < > 7.01* 8.58* 4.61* 5.65* 5.35*  CALCIUM 8.9   < > 7.8* 7.8* 7.8* 8.2* 8.0*  MG 2.8*  --   --   --  2.0 2.0 1.7  PHOS 10.9*   < > 6.6* 7.2* 4.0 4.4 3.8   < > = values in this interval not displayed.   GFR: Estimated Creatinine Clearance: 13.3 mL/min (A) (by C-G formula based on SCr of 5.35 mg/dL (H)). Liver Function Tests: Recent Labs  Lab 06/13/18 0155  06/15/18 0548 06/16/18 0423 06/17/18 0240 06/18/18 0540 06/19/18 0429  AST 22  --   --   --   --   --   --   ALT 14  --   --   --   --   --   --   ALKPHOS 137*  --   --   --   --   --   --   BILITOT 0.7  --   --   --   --   --   --   PROT 6.5  --   --   --   --   --   --   ALBUMIN 2.5*   < > 2.0* 2.1* 2.1* 2.2* 2.1*   < > = values in this interval not displayed.   No results for input(s): LIPASE, AMYLASE in the last 168 hours. No results for input(s): AMMONIA in the last 168 hours. Coagulation Profile: Recent Labs  Lab 06/18/18 0540  INR 1.02   Cardiac Enzymes: No results for input(s): CKTOTAL, CKMB, CKMBINDEX, TROPONINI in the last 168 hours. BNP (last 3 results) No results for input(s): PROBNP in  the last 8760 hours. HbA1C: No results for input(s): HGBA1C in the last 72 hours. CBG: Recent Labs  Lab 06/13/18 2237 06/14/18 0028 06/14/18 0500 06/14/18 1204 06/14/18 1736  GLUCAP 101* 115* 91 121* 105*   Lipid Profile: No results for input(s): CHOL, HDL, LDLCALC, TRIG, CHOLHDL, LDLDIRECT in the last 72 hours. Thyroid Function Tests: No results for input(s): TSH, T4TOTAL, FREET4, T3FREE, THYROIDAB in the last 72 hours. Anemia Panel: No results for input(s): VITAMINB12, FOLATE, FERRITIN, TIBC, IRON, RETICCTPCT in the last 72 hours. Sepsis Labs: No results for input(s): PROCALCITON, LATICACIDVEN in the last 168 hours.  No results found for this or any previous visit (from the past 240 hour(s)).       Radiology Studies: No results found.      Scheduled Meds: . aspirin  81 mg Oral Daily  . atorvastatin  40 mg Oral q1800  . Chlorhexidine Gluconate Cloth  6 each Topical Q0600  . Chlorhexidine Gluconate Cloth  6 each Topical Q0600  . darbepoetin (ARANESP) injection - DIALYSIS  200 mcg Intravenous Q Sat-HD  . mouth rinse  15 mL Mouth Rinse BID  . multivitamin  1 tablet Oral QHS  . nebivolol  2.5 mg Oral QHS  . pantoprazole  40 mg Oral BID  . pyridostigmine  60 mg Oral Q8H  . QUEtiapine  50 mg Oral BID  . sevelamer carbonate  2.4 g Oral TID WC  . sodium chloride flush  3 mL Intravenous Q12H  . ticagrelor  90 mg Oral BID   Continuous Infusions: . sodium chloride 250 mL (06/05/18 2138)     LOS: 28 days    I have spent 35 minutes face to face with the patient and on the ward discussing the patients care, assessment, plan and disposition with other care givers. >50% of the time was devoted counseling the patient about the risks and benefits of treatment and coordinating care.     Kerriann Kamphuis Arsenio Loader, MD [facility] Pager [phone]   If 7PM-7AM,  please contact night-coverage [email or web address] Password [facility] 06/19/2018, 10:35 AM

## 2018-06-19 NOTE — Progress Notes (Signed)
Subjective:  No new c/o's - due for dialysis later today BUT now has had 650 of urine and creatinine has decreased without HD from 5.65 to 5.35- nausea is better   Objective Vital signs in last 24 hours: Vitals:   06/18/18 1544 06/18/18 2107 06/18/18 2209 06/19/18 0546  BP: (!) 147/69 (!) 102/91 (!) 151/72 (!) 146/76  Pulse: 72 70 70 64  Resp:      Temp: 98.6 F (37 C) 97.6 F (36.4 C)  97.7 F (36.5 C)  TempSrc: Oral Oral  Oral  SpO2: 100% 100%  100%  Weight:    57.7 kg  Height:       Weight change:   Intake/Output Summary (Last 24 hours) at 06/19/2018 1130 Last data filed at 06/18/2018 1930 Gross per 24 hour  Intake -  Output 650 ml  Net -650 ml    Assessment/ Plan: Pt is a 52 y.o. yo male with myastenia gravis, substance abuse who was admitted on 05/22/2018 with out of facility cardiac arrest on 7/27 Assessment/Plan: 1. s/p cardiac arrest- LAD stented- had depressed EF at the time- since improved to 40-45 %- on beta blocker 2. Renal- required RRT in some form since 7/29- over 3 weeks so was making preparations for permanent HD.  He has an OP HD spot at [facility] TTS BUT uop seems to be increasing and crt actually decreased from yest to today so may be recovering ??? Has access surgery scheduled for 8/28 but may not need ?? Check daily labs 3. Anemia- on ESA and s/p feraheme- hgb trending up 4. Secondary hyperparathyroidism- on renvela- phos today normal- will hold and change diet to non renal 5. HTN/volume- I think got a little dry at some point-  with some low BP's upon standing-  now OK - low dose bystolic - no change for now   [employer] Se[employer] A    Labs: Basic Metabolic Panel: Recent Labs  Lab 06/17/18 0240 06/18/18 0540 06/19/18 0429  NA 138 137 137  K 3.2* 3.9 3.2*  CL 101 101 102  CO2 30 28 25   GLUCOSE 76 87 96  BUN 19 28* 44*  CREATININE 4.61* 5.65* 5.35*  CALCIUM 7.8* 8.2* 8.0*  PHOS 4.0 4.4 3.8   Liver Function Tests: Recent Labs  Lab 06/13/18 0155   06/17/18 0240 06/18/18 0540 06/19/18 0429  AST 22  --   --   --   --   ALT 14  --   --   --   --   ALKPHOS 137*  --   --   --   --   BILITOT 0.7  --   --   --   --   PROT 6.5  --   --   --   --   ALBUMIN 2.5*   < > 2.1* 2.2* 2.1*   < > = values in this interval not displayed.   No results for input(s): LIPASE, AMYLASE in the last 168 hours. No results for input(s): AMMONIA in the last 168 hours. CBC: Recent Labs  Lab 06/14/18 0241 06/15/18 0548 06/16/18 0423 06/17/18 0240 06/18/18 0540  WBC 9.3 6.9 6.5 6.6 7.0  HGB 9.1* 7.9* 8.1* 8.0* 8.3*  HCT 28.4* 25.6* 26.4* 26.5* 27.2*  MCV 93.4 97.0 97.8 98.5 99.6  PLT 172 143* 188 179 212   Cardiac Enzymes: No results for input(s): CKTOTAL, CKMB, CKMBINDEX, TROPONINI in the last 168 hours. CBG: Recent Labs  Lab 06/13/18 2237 06/14/18 0028 06/14/18 0500 06/14/18  1204 06/14/18 1736  GLUCAP 101* 115* 91 121* 105*    Iron Studies: No results for input(s): IRON, TIBC, TRANSFERRIN, FERRITIN in the last 72 hours. Studies/Results: No results found. Medications: Infusions: . sodium chloride 250 mL (06/05/18 2138)    Scheduled Medications: . aspirin  81 mg Oral Daily  . atorvastatin  40 mg Oral q1800  . Chlorhexidine Gluconate Cloth  6 each Topical Q0600  . Chlorhexidine Gluconate Cloth  6 each Topical Q0600  . darbepoetin (ARANESP) injection - DIALYSIS  200 mcg Intravenous Q Sat-HD  . mouth rinse  15 mL Mouth Rinse BID  . multivitamin  1 tablet Oral QHS  . nebivolol  2.5 mg Oral QHS  . pantoprazole  40 mg Oral BID  . pyridostigmine  60 mg Oral Q8H  . QUEtiapine  50 mg Oral BID  . sevelamer carbonate  2.4 g Oral TID WC  . sodium chloride flush  3 mL Intravenous Q12H  . ticagrelor  90 mg Oral BID    have reviewed scheduled and prn medications.  Physical Exam: General: thin- alert, nad Heart: RRR Lungs: mostly clear Abdomen: soft, non tender Extremities: no edema Dialysis Access: TDC    06/19/2018,11:30 AM  LOS:  28 days

## 2018-06-20 LAB — RENAL FUNCTION PANEL
ANION GAP: 7 (ref 5–15)
Albumin: 2 g/dL — ABNORMAL LOW (ref 3.5–5.0)
BUN: 50 mg/dL — ABNORMAL HIGH (ref 6–20)
CHLORIDE: 106 mmol/L (ref 98–111)
CO2: 24 mmol/L (ref 22–32)
Calcium: 7.8 mg/dL — ABNORMAL LOW (ref 8.9–10.3)
Creatinine, Ser: 4.58 mg/dL — ABNORMAL HIGH (ref 0.61–1.24)
GFR calc Af Amer: 16 mL/min — ABNORMAL LOW (ref >60)
GFR calc non Af Amer: 14 mL/min — ABNORMAL LOW (ref >60)
GLUCOSE: 91 mg/dL (ref 70–99)
POTASSIUM: 3.6 mmol/L (ref 3.5–5.1)
Phosphorus: 3.8 mg/dL (ref 2.5–4.6)
Sodium: 137 mmol/L (ref 135–145)

## 2018-06-20 LAB — MAGNESIUM: Magnesium: 1.6 mg/dL — ABNORMAL LOW (ref 1.7–2.4)

## 2018-06-20 NOTE — Progress Notes (Signed)
PROGRESS NOTE    Howard Thompson  ZOX:[id number] DOB: 1966/03/25 DOA: 05/22/2018 PCP: No primary care provider on file.   Brief Narrative:   52 year old with a history of myasthenia gravis, tobacco abuse, alcohol abuse, cocaine use and cardiac arrest outside of the hospital about 3 weeks ago on 05/22/2018.  Patient was emergently taken to cardiac cath and LAD was stented.  At that time his ejection fraction was 25%.  He was monitored in the ICU and was transferred out.  He did suffer from blood loss anemia therefore required multiple transfusions.  Due to quite a bit of agitation he had to be placed back on Precedex drip and placed in the ICU.  His repeat echocardiogram on 8/14 showed ejection fraction of 45%.  Cardiology has been following.  Had an endoscopy done on 8/15 which showed mild duodenitis without ulceration. Patient continues to be on HD, and appears he may be long term HD now. VVS evaluating the patient for long term HD access placement but patient refusing to have this done as he is hoping his renal function to return.  After patient working with physical therapy in the hospital is functioning well improved therefore recommendations from CIR with change to home physical therapy.  Assessment & Plan:   Active Problems:   ACS (acute coronary syndrome) (HCC)   ST elevation myocardial infarction (STEMI) (HCC)   Ventricular fibrillation (HCC)   Cardiac arrest (HCC)   Shock ([place])   Elevated LFTs   Acute renal failure (HCC)   History of ETT   Thrombocytopenia (HCC)   Substance abuse (HCC)   Cocaine abuse ([place])   Encephalopathy acute   AKI (acute kidney injury) ([place])   Ischemic cardiomyopathy   Acute respiratory failure (HCC)   Hyperlipidemia LDL goal <70   Occult blood in stools   Anemia due to chronic blood loss   Melena   Protein-calorie malnutrition, severe    Acute renal failure now on hemodialysis; stable -Currently he continues to make more urine. Hoping his renal  function improved and will not need long term HD. Nephrology is following. Will likely give him another day or two to see how he progresses. Nephro team following, appreciate their input. Cont to follow his labs daily.   Cardiac arrest likely V. Fib; remains stable.  Acute coronary syndrome but 100% LAD occlusion Ischemic cardiomyopathy, systolic congestive heart failure -Status post cardiac catheterization on 05/22/2018 with stent placed in LAD.  Currently he is on aspirin and Brilinta for 12 months.  Ejection fraction is slightly recovered in last 2 weeks from 25% to 45%.  Chest pain-free.  Cardiology has signed off -Unable to tolerate ACE inhibitor or ARB due to acute kidney injury.  Currently on beta-blocker.  Uptitrate as necessary.  Generalized weakness -Home Health.   Tobacco use -Counseled to quit using this.  Suspected COPD -Given his history he likely has underlying COPD.  Will need outpatient PFTs.  Bronchodilators PRN  Possible GI Bleed with Melena, resolved  -EGD on 8/15 showed duodenitis without any obvious source of bleeding.  Hemoglobin stable.  Continue to monitor him.  Myasthenia gravis -Continues to refuse Mestinon, therefore will discontinue it. He needs to follow up outpatient with Neurology.   HLD -cont statin.   DVT prophylaxis: SCDs Code Status: Full code Family Communication: None Disposition Plan: Home with Home health once his renal function is better.   Consultants:   Cardiology   Nephro  Vascular   Subjective: No acute events overnight. No complaints.  Appear he has made about 1750cc urine in last 24hrs.   Review of Systems General = no fevers, chills, dizziness, malaise, fatigue HEENT/EYES = negative for pain, redness, loss of vision, double vision, blurred vision, loss of hearing, sore throat, hoarseness, dysphagia Cardiovascular= negative for chest pain, palpitation, murmurs, lower extremity swelling Respiratory/lungs= negative for shortness  of breath, cough, hemoptysis, wheezing, mucus production Gastrointestinal= negative for nausea, vomiting,, abdominal pain, melena, hematemesis Genitourinary= negative for Dysuria, Hematuria, Change in Urinary Frequency MSK = Negative for arthralgia, myalgias, Back Pain, Joint swelling  Neurology= Negative for headache, seizures, numbness, tingling  Psychiatry= Negative for anxiety, depression, suicidal and homocidal ideation Allergy/Immunology= Medication/Food allergy as listed  Skin= Negative for Rash, lesions, ulcers, itching   Objective: Vitals:   06/19/18 0546 06/19/18 1418 06/19/18 2058 06/20/18 0534  BP: (!) 146/76 108/74 (!) 158/79 (!) 157/78  Pulse: 64 75 82 75  Resp:  18    Temp: 97.7 F (36.5 C) 98.1 F (36.7 C) 98.3 F (36.8 C) 97.8 F (36.6 C)  TempSrc: Oral Oral Oral Oral  SpO2: 100% 100% 100% 100%  Weight: 57.7 kg     Height:        Intake/Output Summary (Last 24 hours) at 06/20/2018 1104 Last data filed at 06/20/2018 0900 Gross per 24 hour  Intake 118 ml  Output 1750 ml  Net -1632 ml   Filed Weights   06/16/18 0830 06/16/18 1257 06/19/18 0546  Weight: 56.2 kg 56.2 kg 57.7 kg    Examination: Constitutional: NAD, calm, comfortable Eyes: PERRL, lids and conjunctivae normal ENMT: Mucous membranes are moist. Posterior pharynx clear of any exudate or lesions.Normal dentition.  Neck: normal, supple, no masses, no thyromegaly, right chest wall tunneled catheter noted.  Respiratory: clear to auscultation bilaterally, no wheezing, no crackles. Normal respiratory effort. No accessory muscle use.  Cardiovascular: Regular rate and rhythm, no murmurs / rubs / gallops. No extremity edema. 2+ pedal pulses. No carotid bruits.  Abdomen: no tenderness, no masses palpated. No hepatosplenomegaly. Bowel sounds positive.  Musculoskeletal: no clubbing / cyanosis. No joint deformity upper and lower extremities. Good ROM, no contractures. Normal muscle tone.  Skin: no rashes,  lesions, ulcers. No induration Neurologic: CN 2-12 grossly intact. Sensation intact, DTR normal. Strength 4+/5 in all 4.  Psychiatric: Normal judgment and insight. Alert and oriented x 3. Normal mood.    Data Reviewed:   CBC: Recent Labs  Lab 06/14/18 0241 06/15/18 0548 06/16/18 0423 06/17/18 0240 06/18/18 0540  WBC 9.3 6.9 6.5 6.6 7.0  HGB 9.1* 7.9* 8.1* 8.0* 8.3*  HCT 28.4* 25.6* 26.4* 26.5* 27.2*  MCV 93.4 97.0 97.8 98.5 99.6  PLT 172 143* 188 179 253   Basic Metabolic Panel: Recent Labs  Lab 06/16/18 0423 06/17/18 0240 06/18/18 0540 06/19/18 0429 06/20/18 0509  NA 136 138 137 137 137  K 3.5 3.2* 3.9 3.2* 3.6  CL 102 101 101 102 106  CO2 24 30 28 25 24   GLUCOSE 78 76 87 96 91  BUN 56* 19 28* 44* 50*  CREATININE 8.58* 4.61* 5.65* 5.35* 4.58*  CALCIUM 7.8* 7.8* 8.2* 8.0* 7.8*  MG  --  2.0 2.0 1.7 1.6*  PHOS 7.2* 4.0 4.4 3.8 3.8   GFR: Estimated Creatinine Clearance: 15.6 mL/min (A) (by C-G formula based on SCr of 4.58 mg/dL (H)). Liver Function Tests: Recent Labs  Lab 06/16/18 0423 06/17/18 0240 06/18/18 0540 06/19/18 0429 06/20/18 0509  ALBUMIN 2.1* 2.1* 2.2* 2.1* 2.0*   No results for input(s):  LIPASE, AMYLASE in the last 168 hours. No results for input(s): AMMONIA in the last 168 hours. Coagulation Profile: Recent Labs  Lab 06/18/18 0540  INR 1.02   Cardiac Enzymes: No results for input(s): CKTOTAL, CKMB, CKMBINDEX, TROPONINI in the last 168 hours. BNP (last 3 results) No results for input(s): PROBNP in the last 8760 hours. HbA1C: No results for input(s): HGBA1C in the last 72 hours. CBG: Recent Labs  Lab 06/13/18 2237 06/14/18 0028 06/14/18 0500 06/14/18 1204 06/14/18 1736  GLUCAP 101* 115* 91 121* 105*   Lipid Profile: No results for input(s): CHOL, HDL, LDLCALC, TRIG, CHOLHDL, LDLDIRECT in the last 72 hours. Thyroid Function Tests: No results for input(s): TSH, T4TOTAL, FREET4, T3FREE, THYROIDAB in the last 72 hours. Anemia  Panel: No results for input(s): VITAMINB12, FOLATE, FERRITIN, TIBC, IRON, RETICCTPCT in the last 72 hours. Sepsis Labs: No results for input(s): PROCALCITON, LATICACIDVEN in the last 168 hours.  No results found for this or any previous visit (from the past 240 hour(s)).       Radiology Studies: No results found.      Scheduled Meds: . aspirin  81 mg Oral Daily  . atorvastatin  40 mg Oral q1800  . Chlorhexidine Gluconate Cloth  6 each Topical Q0600  . Chlorhexidine Gluconate Cloth  6 each Topical Q0600  . darbepoetin (ARANESP) injection - DIALYSIS  200 mcg Intravenous Q Sat-HD  . mouth rinse  15 mL Mouth Rinse BID  . multivitamin  1 tablet Oral QHS  . nebivolol  2.5 mg Oral QHS  . pantoprazole  40 mg Oral BID  . QUEtiapine  50 mg Oral BID  . sodium chloride flush  3 mL Intravenous Q12H  . ticagrelor  90 mg Oral BID   Continuous Infusions: . sodium chloride 250 mL (06/05/18 2138)     LOS: 29 days    I have spent 20 minutes face to face with the patient and on the ward discussing the patients care, assessment, plan and disposition with other care givers. >50% of the time was devoted counseling the patient about the risks and benefits of treatment and coordinating care.     Ankit Arsenio Loader, MD [facility] Pager [phone]   If 7PM-7AM, please contact night-coverage [email or web address] Password [facility] 06/20/2018, 11:04 AM

## 2018-06-20 NOTE — Progress Notes (Signed)
Subjective:  No new c/o's - 1750 of urine recorded ! creatinine has decreased without HD from  5.35 to 4.58- nausea cont to improve  Objective Vital signs in last 24 hours: Vitals:   06/19/18 0546 06/19/18 1418 06/19/18 2058 06/20/18 0534  BP: (!) 146/76 108/74 (!) 158/79 (!) 157/78  Pulse: 64 75 82 75  Resp:  18    Temp: 97.7 F (36.5 C) 98.1 F (36.7 C) 98.3 F (36.8 C) 97.8 F (36.6 C)  TempSrc: Oral Oral Oral Oral  SpO2: 100% 100% 100% 100%  Weight: 57.7 kg     Height:       Weight change:   Intake/Output Summary (Last 24 hours) at 06/20/2018 1050 Last data filed at 06/20/2018 0900 Gross per 24 hour  Intake 118 ml  Output 1750 ml  Net -1632 ml    Assessment/ Plan: Pt is a 52 y.o. yo male with myastenia gravis, substance abuse who was admitted on 05/22/2018 with out of facility cardiac arrest on 7/27 Assessment/Plan: 1. s/p cardiac arrest- LAD stented- had depressed EF at the time- since improved to 40-45 %- on beta blocker 2. Renal- required RRT in some form since 7/29- over 3 weeks so was making preparations for permanent HD.  He has an OP HD spot at [facility] BUT uop increasing and crt  decreased from yest to today so may be recovering ??? Has access surgery scheduled for 8/28 but may not need ?? Check daily labs- keep PC in for right now 3. Anemia- on ESA and s/p feraheme- hgb trending up 4. Secondary hyperparathyroidism- on renvela- phos today normal- will stop and changed diet to non renal 5. HTN/volume- I think got a little dry at some point-  with some low BP's upon standing-  now OK- even high at times - low dose bystolic - no change in regimen for now   Howard Thompson A    Labs: Basic Metabolic Panel: Recent Labs  Lab 06/18/18 0540 06/19/18 0429 06/20/18 0509  NA 137 137 137  K 3.9 3.2* 3.6  CL 101 102 106  CO2 28 25 24   GLUCOSE 87 96 91  BUN 28* 44* 50*  CREATININE 5.65* 5.35* 4.58*  CALCIUM 8.2* 8.0* 7.8*  PHOS 4.4 3.8 3.8   Liver Function  Tests: Recent Labs  Lab 06/18/18 0540 06/19/18 0429 06/20/18 0509  ALBUMIN 2.2* 2.1* 2.0*   No results for input(s): LIPASE, AMYLASE in the last 168 hours. No results for input(s): AMMONIA in the last 168 hours. CBC: Recent Labs  Lab 06/14/18 0241 06/15/18 0548 06/16/18 0423 06/17/18 0240 06/18/18 0540  WBC 9.3 6.9 6.5 6.6 7.0  HGB 9.1* 7.9* 8.1* 8.0* 8.3*  HCT 28.4* 25.6* 26.4* 26.5* 27.2*  MCV 93.4 97.0 97.8 98.5 99.6  PLT 172 143* 188 179 212   Cardiac Enzymes: No results for input(s): CKTOTAL, CKMB, CKMBINDEX, TROPONINI in the last 168 hours. CBG: Recent Labs  Lab 06/13/18 2237 06/14/18 0028 06/14/18 0500 06/14/18 1204 06/14/18 1736  GLUCAP 101* 115* 91 121* 105*    Iron Studies: No results for input(s): IRON, TIBC, TRANSFERRIN, FERRITIN in the last 72 hours. Studies/Results: No results found. Medications: Infusions: . sodium chloride 250 mL (06/05/18 2138)    Scheduled Medications: . aspirin  81 mg Oral Daily  . atorvastatin  40 mg Oral q1800  . Chlorhexidine Gluconate Cloth  6 each Topical Q0600  . Chlorhexidine Gluconate Cloth  6 each Topical Q0600  . darbepoetin (ARANESP) injection - DIALYSIS  200 mcg Intravenous Q Sat-HD  . mouth rinse  15 mL Mouth Rinse BID  . multivitamin  1 tablet Oral QHS  . nebivolol  2.5 mg Oral QHS  . pantoprazole  40 mg Oral BID  . QUEtiapine  50 mg Oral BID  . sodium chloride flush  3 mL Intravenous Q12H  . ticagrelor  90 mg Oral BID    have reviewed scheduled and prn medications.  Physical Exam: General: thin- alert, nad Heart: RRR Lungs: mostly clear Abdomen: soft, non tender Extremities: no edema Dialysis Access: TDC    06/20/2018,10:50 AM  LOS: 29 days

## 2018-06-21 ENCOUNTER — Inpatient Hospital Stay (HOSPITAL_COMMUNITY): Payer: No Typology Code available for payment source

## 2018-06-21 ENCOUNTER — Encounter (HOSPITAL_COMMUNITY): Payer: Self-pay | Admitting: Student

## 2018-06-21 HISTORY — PX: IR REMOVAL TUN CV CATH W/O FL: IMG2289

## 2018-06-21 LAB — RENAL FUNCTION PANEL
ANION GAP: 6 (ref 5–15)
Albumin: 2 g/dL — ABNORMAL LOW (ref 3.5–5.0)
BUN: 54 mg/dL — ABNORMAL HIGH (ref 6–20)
CALCIUM: 7.8 mg/dL — AB (ref 8.9–10.3)
CO2: 23 mmol/L (ref 22–32)
Chloride: 109 mmol/L (ref 98–111)
Creatinine, Ser: 3.36 mg/dL — ABNORMAL HIGH (ref 0.61–1.24)
GFR calc non Af Amer: 20 mL/min — ABNORMAL LOW (ref >60)
GFR, EST AFRICAN AMERICAN: 23 mL/min — AB (ref >60)
Glucose, Bld: 105 mg/dL — ABNORMAL HIGH (ref 70–99)
POTASSIUM: 3.5 mmol/L (ref 3.5–5.1)
Phosphorus: 2.9 mg/dL (ref 2.5–4.6)
SODIUM: 138 mmol/L (ref 135–145)

## 2018-06-21 LAB — MAGNESIUM: Magnesium: 1.5 mg/dL — ABNORMAL LOW (ref 1.7–2.4)

## 2018-06-21 MED ORDER — POTASSIUM CHLORIDE 10 MEQ/100ML IV SOLN: 10.0000 meq | INTRAVENOUS | Status: AC

## 2018-06-21 MED ORDER — PANTOPRAZOLE SODIUM 40 MG PO TBEC: 40.0000 mg | DELAYED_RELEASE_TABLET | Freq: Two times a day (BID) | ORAL | 0 refills | Status: DC

## 2018-06-21 MED ORDER — MAGNESIUM OXIDE 400 (241.3 MG) MG PO TABS
800.0000 mg | ORAL_TABLET | Freq: Once | ORAL | Status: AC
  Administered 2018-06-21: 800 mg via ORAL
  Filled 2018-06-21: qty 2

## 2018-06-21 MED ORDER — NEBIVOLOL HCL 2.5 MG PO TABS: 2.5000 mg | ORAL_TABLET | Freq: Every day | ORAL | 0 refills | Status: DC

## 2018-06-21 MED ORDER — ATORVASTATIN CALCIUM 40 MG PO TABS: 40.0000 mg | ORAL_TABLET | Freq: Every day | ORAL | 0 refills | Status: DC

## 2018-06-21 MED ORDER — AMLODIPINE BESYLATE 10 MG PO TABS: 10.0000 mg | ORAL_TABLET | Freq: Every day | ORAL | 0 refills | Status: DC

## 2018-06-21 MED ORDER — QUETIAPINE FUMARATE 50 MG PO TABS: 50.0000 mg | ORAL_TABLET | Freq: Two times a day (BID) | ORAL | 0 refills | Status: DC

## 2018-06-21 MED ORDER — ASPIRIN 81 MG PO CHEW: 81.0000 mg | CHEWABLE_TABLET | Freq: Every day | ORAL | 0 refills | Status: DC

## 2018-06-21 MED ORDER — CHLORHEXIDINE GLUCONATE 4 % EX LIQD
CUTANEOUS | Status: AC
  Filled 2018-06-21: qty 15

## 2018-06-21 MED ORDER — POTASSIUM CHLORIDE CRYS ER 20 MEQ PO TBCR
20.0000 meq | EXTENDED_RELEASE_TABLET | Freq: Once | ORAL | Status: AC
  Administered 2018-06-21: 20 meq via ORAL
  Filled 2018-06-21: qty 1

## 2018-06-21 MED ORDER — RENA-VITE PO TABS: 1.0000 | ORAL_TABLET | Freq: Every day | ORAL | 0 refills | Status: AC

## 2018-06-21 MED ORDER — LIDOCAINE HCL 1 % IJ SOLN
INTRAMUSCULAR | Status: AC
  Filled 2018-06-21: qty 20

## 2018-06-21 MED ORDER — TICAGRELOR 90 MG PO TABS: 90.0000 mg | ORAL_TABLET | Freq: Two times a day (BID) | ORAL | 0 refills | Status: DC

## 2018-06-21 MED ORDER — AMLODIPINE BESYLATE 10 MG PO TABS
10.0000 mg | ORAL_TABLET | Freq: Every day | ORAL | Status: DC
  Administered 2018-06-21: 10 mg via ORAL
  Filled 2018-06-21: qty 1

## 2018-06-21 NOTE — Progress Notes (Signed)
HD catheter removed in it's entirety without complication.  Hemostasis achieved.  Dressing placed.  Care instructions given.    Brynda Greathouse, MS RD PA-C 1:24 PM

## 2018-06-21 NOTE — Discharge Summary (Signed)
Physician Discharge Summary  Howard Thompson ZLD:[id number] DOB: [birth year] DOA: 05/22/2018  PCP: No primary care provider on file.  Admit date: 05/22/2018 Discharge date: 06/21/2018  Admitted From: Home Disposition: Home  Recommendations for Outpatient Follow-up:  1. Follow up with PCP in 1-2 weeks 2. Please obtain BMP/CBC in one week your next doctors visit.  3. Follow-up with cardiology, Dr. Gwenlyn Found in 2-3 weeks 4. Follow-up with [facility] in the next 4-5 days.  Repeat lab work will be needed then 5. Necessary medications have been prescribed.  Home Health: PT/OT Discharge Condition: Stable CODE STATUS: Full code Diet recommendation: Renal diet  Brief/Interim Summary:  52 year old with a history of myasthenia gravis, tobacco abuse, alcohol abuse, cocaine use and cardiac arrest outside of the hospital about 3 weeks ago on 05/22/2018.  Patient was emergently taken to cardiac cath and LAD was stented.  At that time his ejection fraction was 25%.  He was monitored in the ICU and was transferred out.  He did suffer from blood loss anemia therefore required multiple transfusions.  Due to quite a bit of agitation he had to be placed back on Precedex drip and placed in the ICU.  His repeat echocardiogram on 8/14 showed ejection fraction of 45%.  Cardiology has been following.  Had an endoscopy done on 8/15 which showed mild duodenitis without ulceration. Patient continues to be on HD, and appears he may be long term HD now. VVS evaluating the patient for long term HD access placement but patient refusing to have this done as he is hoping his renal function to return.  After patient working with physical therapy in the hospital is functioning well improved therefore recommendations from CIR with change to home physical therapy.  Arrangements were made with the help of case manager. Over the course of few days patient's renal function started improving and he started to urinate without  any issues.  Per nephrology recommendations his triple-lumen catheter for dialysis was removed by interventional radiology.  He needs outpatient follow-up with nephrology next 4-5 days and have repeat lab work done.  Further determination will be made at that time.  All the necessary prescriptions were provided to the patient's.  Patient had a prolonged hospital stay and was taken care of by me during last 4-5 days.  For extra details regarding patient care please refer to the chart. Medically stable at this time otherwise.   Discharge Diagnoses:  Active Problems:   ACS (acute coronary syndrome) (HCC)   ST elevation myocardial infarction (STEMI) (HCC)   Ventricular fibrillation (HCC)   Cardiac arrest (HCC)   Shock ([place])   Elevated LFTs   Acute renal failure (HCC)   History of ETT   Thrombocytopenia (HCC)   Substance abuse ([place])   Cocaine abuse ([place])   Encephalopathy acute   AKI (acute kidney injury) ([place])   Ischemic cardiomyopathy   Acute respiratory failure (HCC)   Hyperlipidemia LDL goal <70   Occult blood in stools   Anemia due to chronic blood loss   Melena   Protein-calorie malnutrition, severe    Acute renal failure now on hemodialysis; stable -Improving.  Patient continues to make more urine and his creatinine is trended down.  Per nephrology, interventional radiology is removed dialysis catheter.  He will need to follow-up with him in 4-5 days for repeat lab work. -During his hospitalization he was anuric initially and received multiple sessions of hemodialysis which he tolerated well.  He was also advised by vascular surgery in  case if he needed long-term access but fortunately he did not end up requiring this.  Cardiac arrest likely V. Fib; remains stable.  Acute coronary syndrome but 100% LAD occlusion Ischemic cardiomyopathy, systolic congestive heart failure -Status post cardiac catheterization on 05/22/2018 with stent placed in LAD.  Currently he is on aspirin and  Brilinta for 12 months.  Ejection fraction is slightly recovered in last 2 weeks from 25% to 45%.  Chest pain-free.  Cardiology has signed off -Unable to tolerate ACE inhibitor or ARB due to acute kidney injury.  Currently on beta-blocker.  Uptitrate as necessary. -Follow-up outpatient with cardiology in 2-3 weeks, Dr. Quay Burow  Generalized weakness -Home Health.   Tobacco use -Counseled to quit using this.  Suspected COPD -Given his history he likely has underlying COPD.  Will need outpatient PFTs.  Bronchodilators PRN  Possible GI Bleed with Melena, resolved  -EGD on 8/15 showed duodenitis without any obvious source of bleeding.  Hemoglobin stable.  Continue to monitor him.  Myasthenia gravis -Continues to refuse Mestinon, therefore will discontinue it. He needs to follow up outpatient with Neurology.   HLD -cont statin.   DVT prophylaxis: SCDs Code Status: Full code Family Communication: None Disposition Plan:  Discharged today with home health   Discharge Instructions  Discharge Instructions    AMB Referral to Cardiac Rehabilitation - Phase II   Complete by:  As directed    Diagnosis:  STEMI     Allergies as of 06/21/2018   No Known Allergies     Medication List    TAKE these medications   amLODipine 10 MG tablet Commonly known as:  NORVASC Take 1 tablet (10 mg total) by mouth daily. Start taking on:  06/22/2018   aspirin 81 MG chewable tablet Chew 1 tablet (81 mg total) by mouth daily. Start taking on:  06/22/2018   atorvastatin 40 MG tablet Commonly known as:  LIPITOR Take 1 tablet (40 mg total) by mouth daily at 6 PM.   multivitamin Tabs tablet Take 1 tablet by mouth at bedtime.   nebivolol 2.5 MG tablet Commonly known as:  BYSTOLIC Take 1 tablet (2.5 mg total) by mouth at bedtime.   nitroGLYCERIN 0.4 MG SL tablet Commonly known as:  NITROSTAT Place 0.4 mg under the tongue every 5 (five) minutes as needed for chest pain.    pantoprazole 40 MG tablet Commonly known as:  PROTONIX Take 1 tablet (40 mg total) by mouth 2 (two) times daily.   QUEtiapine 50 MG tablet Commonly known as:  SEROQUEL Take 1 tablet (50 mg total) by mouth 2 (two) times daily.   ticagrelor 90 MG Tabs tablet Commonly known as:  BRILINTA Take 1 tablet (90 mg total) by mouth 2 (two) times daily.      Follow-up Information    [facility]. Schedule an appointment as soon as possible for a visit in 1 week(s).   Contact information: [place] [address] [phone]       Lorretta Harp, MD. Schedule an appointment as soon as possible for a visit in 2 week(s).   Specialties:  Cardiology, Radiology Contact information: [address] [place] [address] [phone]        [facility]. Schedule an appointment as soon as possible for a visit in 4 day(s).   Specialty:  Nephrology Contact information: [address] [place] [address] [phone]  No Known Allergies  You were cared for by a hospitalist during your hospital stay. If you have any questions about your discharge medications or the care you received while you were in the hospital after you are discharged, you can call the unit and asked to speak with the hospitalist on call if the hospitalist that took care of you is not available. Once you are discharged, your primary care physician will handle any further medical issues. Please note that no refills for any discharge medications will be authorized once you are discharged, as it is imperative that you return to your primary care physician (or establish a relationship with a primary care physician if you do not have one) for your aftercare needs so that they can reassess your need for medications and monitor your lab values.  Consultations:  Critical  care  Gastroenterology  Neurology  Interventional radiology  Nephrology  Vascular surgery  Cardiology   Procedures/Studies: Ct Abdomen Pelvis Wo Contrast  Result Date: 06/06/2018 CLINICAL DATA:  Anemia. EXAM: CT ABDOMEN AND PELVIS WITHOUT CONTRAST TECHNIQUE: Multidetector CT imaging of the abdomen and pelvis was performed following the standard protocol without IV contrast. COMPARISON:  None. FINDINGS: Lower chest: Bilateral posterior lower lobe consolidation/atelectasis. Hepatobiliary: Unenhanced appearance of the liver is unremarkable. The gallbladder appears to be contracted. Pancreas: Unremarkable. No pancreatic ductal dilatation or surrounding inflammatory changes. Spleen: Normal in size without focal abnormality. Adrenals/Urinary Tract: Unenhanced appearance of the adrenal glands and kidneys are unremarkable. No hydronephrosis. The bladder appears unremarkable. Stomach/Bowel: Feeding tube extends into the duodenum with the tip at the juncture of the second and third portions. Visualized bowel shows no evidence of obstruction or ileus. No inflammation identified. Rectal tube present. Vascular/Lymphatic: Calcified abdominal aorta without evidence of aneurysmal disease. 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 Reproductive: Prostate is unremarkable. Other: No evidence of retroperitoneal or intraperitoneal hemorrhage. No abnormal fluid collections or ascites. No hernias identified. Musculoskeletal: No acute or significant osseous findings. IMPRESSION: 1. No evidence of intraperitoneal or retroperitoneal hemorrhage. 2. No acute findings in the abdomen or pelvis by unenhanced CT. 3. Dilatation of the left common femoral artery up to 1.9 cm in maximal diameter. Electronically Signed   By: Aletta Edouard M.D.   On: 06/06/2018 13:42   Ct Head Wo Contrast  Result Date: 05/22/2018 CLINICAL DATA:  Status post CPR EXAM: CT HEAD WITHOUT  CONTRAST CT CERVICAL SPINE WITHOUT CONTRAST TECHNIQUE: Multidetector CT imaging of the head and cervical spine was performed following the standard protocol without intravenous contrast. Multiplanar CT image reconstructions of the cervical spine were also generated. COMPARISON:  None. FINDINGS: CT HEAD FINDINGS Brain: No acute intracranial abnormality. Specifically, no hemorrhage, hydrocephalus, mass lesion, acute infarction, or significant intracranial injury. Vascular: No hyperdense vessel or unexpected calcification. Skull: No acute calvarial abnormality. Sinuses/Orbits: Mucosal thickening throughout the paranasal sinuses. Opacified scattered ethmoid air cells. Other: None CT CERVICAL SPINE FINDINGS Alignment: Normal Skull base and vertebrae: No fracture Soft tissues and spinal canal: Prevertebral soft tissues are normal. No epidural or paraspinal hematoma. Disc levels: Slight disc space narrowing at C5-6 and C6-7. Mild degenerative facet disease bilaterally. Upper chest: Mild emphysema in the apices. Other: No acute findings IMPRESSION: No acute intracranial abnormality. No acute bony abnormality in the cervical spine. Electronically Signed   By: Rolm Baptise M.D.   On: 05/22/2018 18:22   Ct Cervical Spine Wo Contrast  Result Date: 05/22/2018 CLINICAL DATA:  Status post  CPR EXAM: CT HEAD WITHOUT CONTRAST CT CERVICAL SPINE WITHOUT CONTRAST TECHNIQUE: Multidetector CT imaging of the head and cervical spine was performed following the standard protocol without intravenous contrast. Multiplanar CT image reconstructions of the cervical spine were also generated. COMPARISON:  None. FINDINGS: CT HEAD FINDINGS Brain: No acute intracranial abnormality. Specifically, no hemorrhage, hydrocephalus, mass lesion, acute infarction, or significant intracranial injury. Vascular: No hyperdense vessel or unexpected calcification. Skull: No acute calvarial abnormality. Sinuses/Orbits: Mucosal thickening throughout the paranasal  sinuses. Opacified scattered ethmoid air cells. Other: None CT CERVICAL SPINE FINDINGS Alignment: Normal Skull base and vertebrae: No fracture Soft tissues and spinal canal: Prevertebral soft tissues are normal. No epidural or paraspinal hematoma. Disc levels: Slight disc space narrowing at C5-6 and C6-7. Mild degenerative facet disease bilaterally. Upper chest: Mild emphysema in the apices. Other: No acute findings IMPRESSION: No acute intracranial abnormality. No acute bony abnormality in the cervical spine. Electronically Signed   By: Rolm Baptise M.D.   On: 05/22/2018 18:22   Mr Brain Wo Contrast  Result Date: 06/07/2018 CLINICAL DATA:  52 y/o M; found down, unwitnessed arrest with CPR, status post hypothermia protocol. Patient with metabolic disarray, acid base imbalance, hypovolemia, and new onset delirium with incomprehensible speech 3 days ago. EXAM: MRI HEAD WITHOUT CONTRAST TECHNIQUE: Axial and coronal diffusion weighted sequences were acquired. The patient was unable to remain still and additional sequences were not acquired. COMPARISON:  05/22/2018 CT head FINDINGS: Motion degraded axial and coronal diffusion weighted sequences. No gross diffusion abnormality to suggest acute or early subacute ischemia. On the low B value sequence there are no findings of mass effect, hydrocephalus, or herniation. IMPRESSION: Motion degraded diffusion-weighted sequences. No gross diffusion abnormality to suggest acute or early subacute ischemia. No mass effect, hydrocephalus, or herniation. Repeat MRI of the brain is recommended when patient is able to remain still. Electronically Signed   By: Kristine Garbe M.D.   On: 06/07/2018 22:45   Ir Fluoro Guide Cv Line Right  Result Date: 05/31/2018 CLINICAL DATA:  Acute kidney injury, needs durable venous access for hemodialysis EXAM: TUNNELED HEMODIALYSIS CATHETER PLACEMENT WITH ULTRASOUND AND FLUOROSCOPIC GUIDANCE TECHNIQUE: The procedure, risks, benefits,  and alternatives were explained to the patient and family. Questions regarding the procedure were encouraged and answered. The family understands and consents to the procedure. As antibiotic prophylaxis, cefazolin 2 g was ordered pre-procedure and administered intravenously within one hour of incision.Patency of the right IJ vein was confirmed with ultrasound with image documentation. An appropriate skin site was determined. Region was prepped using maximum barrier technique including cap and mask, sterile gown, sterile gloves, large sterile sheet, and Chlorhexidine as cutaneous antisepsis. The region was infiltrated locally with 1% lidocaine. Intravenous Fentanyl and Versed were administered as conscious sedation during continuous monitoring of the patient's level of consciousness and physiological / cardiorespiratory status by the radiology RN, with a total moderate sedation time of 33 minutes. Under real-time ultrasound guidance, the right IJ vein was accessed with a 21 gauge micropuncture needle; the needle tip within the vein was confirmed with ultrasound image documentation. Needle exchanged over the 018 guidewire for transitional dilator, which allowed advancement of a Benson wire into the IVC. Over this, an MPA catheter was advanced. A Palindrome 23 hemodialysis catheter was tunneled from the right anterior chest wall approach to the right IJ dermatotomy site. The MPA catheter was exchanged over an Amplatz wire for serial vascular dilators which allow placement of a peel-away sheath, through which the catheter was  advanced under intermittent fluoroscopy, positioned with its tips in the proximal and midright atrium. Spot chest radiograph confirms good catheter position. No pneumothorax. Catheter was flushed and primed per protocol. Catheter secured externally with O Prolene sutures. The right IJ dermatotomy site was closed with Dermabond. COMPLICATIONS: COMPLICATIONS None immediate FLUOROSCOPY TIME:  24  seconds; 2 mGy COMPARISON:  None IMPRESSION: 1. Technically successful placement of tunneled right IJ hemodialysis catheter with ultrasound and fluoroscopic guidance. Ready for routine use. ACCESS: Remains approachable for percutaneous intervention as needed. Electronically Signed   By: Lucrezia Europe M.D.   On: 05/31/2018 16:47   Ir US Guide Vasc Access Right  Result Date: 05/31/2018 CLINICAL DATA:  Acute kidney injury, needs durable venous access for hemodialysis EXAM: TUNNELED HEMODIALYSIS CATHETER PLACEMENT WITH ULTRASOUND AND FLUOROSCOPIC GUIDANCE TECHNIQUE: The procedure, risks, benefits, and alternatives were explained to the patient and family. Questions regarding the procedure were encouraged and answered. The family understands and consents to the procedure. As antibiotic prophylaxis, cefazolin 2 g was ordered pre-procedure and administered intravenously within one hour of incision.Patency of the right IJ vein was confirmed with ultrasound with image documentation. An appropriate skin site was determined. Region was prepped using maximum barrier technique including cap and mask, sterile gown, sterile gloves, large sterile sheet, and Chlorhexidine as cutaneous antisepsis. The region was infiltrated locally with 1% lidocaine. Intravenous Fentanyl and Versed were administered as conscious sedation during continuous monitoring of the patient's level of consciousness and physiological / cardiorespiratory status by the radiology RN, with a total moderate sedation time of 33 minutes. Under real-time ultrasound guidance, the right IJ vein was accessed with a 21 gauge micropuncture needle; the needle tip within the vein was confirmed with ultrasound image documentation. Needle exchanged over the 018 guidewire for transitional dilator, which allowed advancement of a Benson wire into the IVC. Over this, an MPA catheter was advanced. A Palindrome 23 hemodialysis catheter was tunneled from the right anterior chest wall  approach to the right IJ dermatotomy site. The MPA catheter was exchanged over an Amplatz wire for serial vascular dilators which allow placement of a peel-away sheath, through which the catheter was advanced under intermittent fluoroscopy, positioned with its tips in the proximal and midright atrium. Spot chest radiograph confirms good catheter position. No pneumothorax. Catheter was flushed and primed per protocol. Catheter secured externally with O Prolene sutures. The right IJ dermatotomy site was closed with Dermabond. COMPLICATIONS: COMPLICATIONS None immediate FLUOROSCOPY TIME:  24 seconds; 2 mGy COMPARISON:  None IMPRESSION: 1. Technically successful placement of tunneled right IJ hemodialysis catheter with ultrasound and fluoroscopic guidance. Ready for routine use. ACCESS: Remains approachable for percutaneous intervention as needed. Electronically Signed   By: Lucrezia Europe M.D.   On: 05/31/2018 16:47   Dg Chest Port 1 View  Result Date: 06/07/2018 CLINICAL DATA:  Respiratory failure and shortness of breath. EXAM: PORTABLE CHEST 1 VIEW COMPARISON:  06/04/2018 and prior radiographs FINDINGS: Cardiomegaly, small bore feeding tube entering the stomach with tip off the field of view and RIGHT central venous catheter with tip overlying the SUPERIOR cavoatrial junction noted. Bilateral LOWER lung opacities/airspace disease again noted, slightly improved on the LEFT. No pneumothorax or large pleural effusion identified. IMPRESSION: Bilateral LOWER lung opacities/airspace disease, slightly improved on the LEFT. Feeding tube placement. No other significant change. Electronically Signed   By: Margarette Canada M.D.   On: 06/07/2018 07:14   Dg Chest Port 1 View  Result Date: 06/04/2018 CLINICAL DATA:  Cough. EXAM: PORTABLE CHEST  1 VIEW COMPARISON:  Radiograph of May 29, 2018. FINDINGS: Stable cardiomediastinal silhouette. Interval placement of right internal jugular dialysis catheter with distal tip in expected  position of cavoatrial junction. No pneumothorax is noted. Slightly improved bilateral lung opacities are noted, left greater than right, most consistent with pneumonia or possibly edema. No significant pleural effusion is noted. Bony thorax is unremarkable. IMPRESSION: Interval placement of right internal jugular dialysis catheter. Slightly improved bilateral pneumonia or edema is noted, left greater than right. Electronically Signed   By: Marijo Conception, M.D.   On: 06/04/2018 12:14   Dg Chest Port 1 View  Result Date: 05/29/2018 CLINICAL DATA:  Acute respiratory failure. EXAM: PORTABLE CHEST 1 VIEW COMPARISON:  05/26/2018 FINDINGS: Endotracheal tube and NG tube is been removed in the interval. The left IJ central line remains in place, crossing the midline and extending cranially towards the thoracic inlet, likely in the right innominate vein directed cranially. Cardiopericardial silhouette is at upper limits of normal for size. Interval progression of bilateral airspace disease with central predominance. The visualized bony structures of the thorax are intact. Telemetry leads overlie the chest. IMPRESSION: 1. Interval progression of central airspace disease suggests edema. 2. Left IJ central line crosses the midline is directed cranially on the right, overlying the expected location of the right innominate vein. Electronically Signed   By: Misty Stanley M.D.   On: 05/29/2018 02:59   Dg Chest Port 1 View  Result Date: 05/26/2018 CLINICAL DATA:  Endotracheal tube placement. EXAM: PORTABLE CHEST 1 VIEW COMPARISON:  Radiograph of May 24, 2018. FINDINGS: Stable cardiomediastinal silhouette. Endotracheal and nasogastric tubes are unchanged in position. Sternotomy wires are noted. No pneumothorax or pleural effusion is noted. Stable calcified left hilar lymph node is noted. Mild medial bibasilar opacities are noted which are improved compared to prior exam, suggesting improving atelectasis or infiltrates. Bony  thorax is unremarkable. IMPRESSION: Improving bibasilar opacities compared to prior exam. Stable support apparatus. Electronically Signed   By: Marijo Conception, M.D.   On: 05/26/2018 12:16   Dg Chest Port 1 View  Result Date: 05/24/2018 CLINICAL DATA:  ETT present,Displacement of central venous catheter EXAM: PORTABLE CHEST 1 VIEW COMPARISON:  05/23/2018 FINDINGS: Endotracheal tube is in place with tip 2.2 centimeters above the carina. The nasogastric tube is in place, tip overlying the level of the stomach. LEFT IJ central line tip is directed superiorly possibly in the RIGHT brachiocephalic vein and is unchanged. There is a significant opacity in the MEDIAL lung bases bilaterally, RIGHT greater than LEFT and stable in appearance. IMPRESSION: Stable bilateral LOWER lobe infiltrates. LEFT IJ central line remains superiorly directed in the RIGHT superior mediastinum. Electronically Signed   By: Nolon Nations M.D.   On: 05/24/2018 09:07   Dg Chest Port 1 View  Result Date: 05/23/2018 CLINICAL DATA:  Respiratory failure EXAM: PORTABLE CHEST 1 VIEW COMPARISON:  Chest radiograph from one day prior. FINDINGS: Endotracheal tube tip is 5.1 cm above the carina. Left internal jugular central venous catheter loops in the upper mediastinum with the tip oriented superiorly in the high right mediastinum, unchanged. Intact sternotomy wires. Enteric tube enters lower thoracic esophagus with the tip not definitely seen on this image. Stable cardiomediastinal silhouette with normal heart size. No pneumothorax. No pleural effusion. Stable patchy right lung base and left lower parahilar consolidation. IMPRESSION: 1. Stable malpositioned left internal jugular central venous catheter with the tip oriented superiorly in the high right mediastinum. Otherwise well-positioned support structures. 2. Stable  patchy right lung base and left lower parahilar consolidation. Electronically Signed   By: Ilona Sorrel M.D.   On: 05/23/2018  07:39   Dg Chest Port 1 View  Result Date: 05/22/2018 CLINICAL DATA:  Central line placement. EXAM: PORTABLE CHEST 1 VIEW COMPARISON:  May 22, 2018 FINDINGS: An ETT terminates in good position in the mid trachea. A new left central line courses to the right of midline with the tip directed superiorly. Recommend repositioning. The NG tube terminates below today's film. No pneumothorax. Mild left perihilar opacity. More focal opacity in the right medial lung base most suggestive of infiltrate. The heart, hila, and mediastinum are unchanged. IMPRESSION: 1. The new left central line terminates to the right of midline with the tip directed superiorly, possibly in the right brachiocephalic vein. Recommend repositioning. 2. Other support apparatus as above. 3. Focal infiltrate in the medial right lung base favored to represent pneumonia or aspiration. Recommend follow-up to resolution. 4. Left perihilar opacity is nonspecific but may represent infiltrate as well. These results will be called to the ordering clinician or representative by the Radiologist Assistant, and communication documented in the PACS or zVision Dashboard. Electronically Signed   By: Dorise Bullion III M.D   On: 05/22/2018 21:55   Dg Chest Port 1 View  Result Date: 05/22/2018 CLINICAL DATA:  Endotracheal tube placement EXAM: PORTABLE CHEST 1 VIEW COMPARISON:  None FINDINGS: Endotracheal tube tip is at the level of the clavicular heads. The orogastric tube tip and side port project over the stomach. There are moderate bilateral parahilar opacities. Lungs are otherwise clear. IMPRESSION: 1. Radiographically appropriate positioning of endotracheal and orogastric tubes. 2. Bilateral parahilar opacities may indicate pulmonary edema. Electronically Signed   By: Ulyses Jarred M.D.   On: 05/22/2018 20:35   Dg Abd Portable 1v  Result Date: 05/29/2018 CLINICAL DATA:  Pain. EXAM: PORTABLE ABDOMEN - 1 VIEW COMPARISON:  None. FINDINGS: Right femoral  catheter terminates at the level of the sacrum. No bowel dilatation to suggest obstruction. Slight increased air throughout central small bowel without abnormal distension. No evidence of free air. No radiopaque calculi. Ill-defined patchy bibasilar opacities. Incidental hemi transitional lumbosacral anatomy. IMPRESSION: Nonobstructive bowel gas pattern. Slight increased small bowel gas suggests mild ileus. Electronically Signed   By: Jeb Levering M.D.   On: 05/29/2018 02:37   Dg Swallowing Func-speech Pathology  Result Date: 05/30/2018 Objective Swallowing Evaluation: Type of Study: Bedside Swallow Evaluation  Patient Details Name: Zakaria Sedor MRN: [id number] Date of Birth: 1966-09-13 Today's Date: 05/30/2018 Time: SLP Start Time (ACUTE ONLY): 1115 -SLP Stop Time (ACUTE ONLY): 1138 SLP Time Calculation (min) (ACUTE ONLY): 23 min Past Medical History: No past medical history on file. Past Surgical History: Past Surgical History: Procedure Laterality Date . CORONARY STENT INTERVENTION N/A 05/22/2018  Procedure: CORONARY STENT INTERVENTION;  Surgeon: Lorretta Harp, MD;  Location: [facility];  Service: Cardiovascular;  Laterality: N/A; . LEFT HEART CATH AND CORONARY ANGIOGRAPHY N/A 05/22/2018  Procedure: LEFT HEART CATH AND CORONARY ANGIOGRAPHY;  Surgeon: Lorretta Harp, MD;  Location: [facility];  Service: Cardiovascular;  Laterality: N/A; HPI: Fontaine Hehl is a 52 y.o. male admitted on 7/27 s/p unwitnessed out of hospital cardiac arrest, VF, unknown downtime, past medical history of coronary artery disease, myasthenia gravis, hypertension.  Per wife, pt has had hx of dysphagia related to MG which is largely resolved.  Pt was intubated from 7/27 - 8/2.  Subjective: The patient was seen in radiology  for instrumental exam with nurse present.  Assessment / Plan / Recommendation CHL IP CLINICAL IMPRESSIONS 05/30/2018 Clinical Impression MBS was completed using thin liquids via spoon and straw,  nectar thick liquids via cup sip, pureed material and solids.  The patient presented with a mild pharyngeal dysphagia.  His biggest risk factor for aspiration is his current mentation and ability to attend to task.  At times, he held material orally requiring cue to swallow.  This was related to his current level of confusion rather then a true oral dysphagia.  The pharyngeal phase was marked by mildly reduced base of tongue retraction which led to mild vallecula residue across textures.  There was one episode of penetration to the level of the vocal cords given a straw sip of thin liquids following presentation of regular solids. The patient required cue to cough and reswallow that was minimally successful most likely due to lack of effort on the his part due to difficulty fully following commands.   Esophageal sweep revealed it slow to clear with liquid wash being helpful to facilitate clearance.  Recommend continue with a pureed diet with thin liquids.  The patient MUST be sat totally upright for all intake and should eat/drink slowly.  As mentation clears he can most likely be advanced to regular solids.  As stated above his biggest risk factor for aspiration is his current mentation and decreased attention to task  ST will follow for therapeutic diet tolerance and possible diet advancement as mentation hopefully clears.  MD please consider cognitive/liguistic evaluation. SLP Visit Diagnosis Dysphagia, oropharyngeal phase (R13.12) Attention and concentration deficit following -- Frontal lobe and executive function deficit following -- Impact on safety and function Mild aspiration risk   CHL IP TREATMENT RECOMMENDATION 05/30/2018 Treatment Recommendations Therapy as outlined in treatment plan below   Prognosis 05/30/2018 Prognosis for Safe Diet Advancement Good Barriers to Reach Goals Cognitive deficits Barriers/Prognosis Comment -- CHL IP DIET RECOMMENDATION 05/30/2018 SLP Diet Recommendations Dysphagia 1 (Puree)  solids;Thin liquid Liquid Administration via Cup;Straw Medication Administration Whole meds with puree Compensations Minimize environmental distractions;Slow rate;Small sips/bites Postural Changes Seated upright at 90 degrees;Remain semi-upright after after feeds/meals (Comment)   CHL IP OTHER RECOMMENDATIONS 05/30/2018 Recommended Consults -- Oral Care Recommendations Oral care before and after PO Other Recommendations --   CH[facility] IP FOLLOW UP RECOMMENDATIONS 05/30/2018 Follow up Recommendations Other (comment)   CHL IP FREQUENCY AND DURATION 05/30/2018 Speech Therapy Frequency (ACUTE ONLY) min 2x/week Treatment Duration 2 weeks      CHL IP ORAL PHASE 05/30/2018 Oral Phase WFL Oral - Pudding Teaspoon -- Oral - Pudding Cup -- Oral - Honey Teaspoon -- Oral - Honey Cup -- Oral - Nectar Teaspoon -- Oral - Nectar Cup -- Oral - Nectar Straw -- Oral - Thin Teaspoon -- Oral - Thin Cup -- Oral - Thin Straw -- Oral - Puree -- Oral - Mech Soft -- Oral - Regular -- Oral - Multi-Consistency -- Oral - Pill -- Oral Phase - Comment --  CHL IP PHARYNGEAL PHASE 05/30/2018 Pharyngeal Phase Impaired Pharyngeal- Pudding Teaspoon -- Pharyngeal -- Pharyngeal- Pudding Cup -- Pharyngeal -- Pharyngeal- Honey Teaspoon -- Pharyngeal -- Pharyngeal- Honey Cup -- Pharyngeal -- Pharyngeal- Nectar Teaspoon -- Pharyngeal -- Pharyngeal- Nectar Cup Delayed swallow initiation-vallecula;Reduced tongue base retraction;Pharyngeal residue - valleculae Pharyngeal -- Pharyngeal- Nectar Straw -- Pharyngeal -- Pharyngeal- Thin Teaspoon Delayed swallow initiation-pyriform sinuses;Reduced epiglottic inversion;Reduced tongue base retraction;Pharyngeal residue - valleculae Pharyngeal -- Pharyngeal- Thin Cup -- Pharyngeal -- Pharyngeal- Thin Straw Delayed  swallow initiation-pyriform sinuses;Reduced epiglottic inversion;Penetration/Aspiration during swallow;Reduced tongue base retraction;Pharyngeal residue - valleculae Pharyngeal Material enters airway, CONTACTS cords and not  ejected out Pharyngeal- Puree Reduced tongue base retraction;Pharyngeal residue - valleculae Pharyngeal -- Pharyngeal- Mechanical Soft -- Pharyngeal -- Pharyngeal- Regular Reduced tongue base retraction;Pharyngeal residue - valleculae Pharyngeal -- Pharyngeal- Multi-consistency -- Pharyngeal -- Pharyngeal- Pill -- Pharyngeal -- Pharyngeal Comment --  CHL IP CERVICAL ESOPHAGEAL PHASE 05/30/2018 Cervical Esophageal Phase WFL Pudding Teaspoon -- Pudding Cup -- Honey Teaspoon -- Honey Cup -- Nectar Teaspoon -- Nectar Cup -- Nectar Straw -- Thin Teaspoon -- Thin Cup -- Thin Straw -- Puree -- Mechanical Soft -- Regular -- Multi-consistency -- Pill -- Cervical Esophageal Comment -- Shelly Flatten, MA, CCC-SLP Acute Rehab SLP [phone] [name] 05/30/2018, 11:57 AM                 Subjective: No complaints.  He has made more than 1.5 L of urine in last 24 hours.  His creatinine is trended down.  He is excited to get his dialysis catheter removed and go home today.  General = no fevers, chills, dizziness, malaise, fatigue HEENT/EYES = negative for pain, redness, loss of vision, double vision, blurred vision, loss of hearing, sore throat, hoarseness, dysphagia Cardiovascular= negative for chest pain, palpitation, murmurs, lower extremity swelling Respiratory/lungs= negative for shortness of breath, cough, hemoptysis, wheezing, mucus production Gastrointestinal= negative for nausea, vomiting,, abdominal pain, melena, hematemesis Genitourinary= negative for Dysuria, Hematuria, Change in Urinary Frequency MSK = Negative for arthralgia, myalgias, Back Pain, Joint swelling  Neurology= Negative for headache, seizures, numbness, tingling  Psychiatry= Negative for anxiety, depression, suicidal and homocidal ideation Allergy/Immunology= Medication/Food allergy as listed  Skin= Negative for Rash, lesions, ulcers, itching   Discharge Exam: Vitals:   06/20/18 2142 06/21/18 0524  BP: (!) 157/77 (!) 168/80   Pulse: 86 83  Resp:    Temp: 98.2 F (36.8 C) 98.4 F (36.9 C)  SpO2: 100% 100%   Vitals:   06/20/18 0534 06/20/18 1448 06/20/18 2142 06/21/18 0524  BP: (!) 157/78 (!) 120/95 (!) 157/77 (!) 168/80  Pulse: 75 83 86 83  Resp:  20    Temp: 97.8 F (36.6 C) 99.6 F (37.6 C) 98.2 F (36.8 C) 98.4 F (36.9 C)  TempSrc: Oral Oral Oral Oral  SpO2: 100% 100% 100% 100%  Weight:      Height:        General: Pt is alert, awake, not in acute distress; right neck HD catheter noted.  Cardiovascular: RRR, S1/S2 +, no rubs, no gallops Respiratory: CTA bilaterally, no wheezing, no rhonchi Abdominal: Soft, NT, ND, bowel sounds + Extremities: no edema, no cyanosis    The results of significant diagnostics from this hospitalization (including imaging, microbiology, ancillary and laboratory) are listed below for reference.     Microbiology: No results found for this or any previous visit (from the past 240 hour(s)).   Labs: BNP (last 3 results) Recent Labs    05/22/18 2027  BNP 299.2*   Basic Metabolic Panel: Recent Labs  Lab 06/17/18 0240 06/18/18 0540 06/19/18 0429 06/20/18 0509 06/21/18 0402  NA 138 137 137 137 138  K 3.2* 3.9 3.2* 3.6 3.5  CL 101 101 102 106 109  CO2 30 28 25 24 23   GLUCOSE 76 87 96 91 105*  BUN 19 28* 44* 50* 54*  CREATININE 4.61* 5.65* 5.35* 4.58* 3.36*  CALCIUM 7.8* 8.2* 8.0* 7.8* 7.8*  MG 2.0 2.0 1.7 1.6* 1.5*  PHOS 4.0 4.4 3.8 3.8 2.9   Liver Function Tests: Recent Labs  Lab 06/17/18 0240 06/18/18 0540 06/19/18 0429 06/20/18 0509 06/21/18 0402  ALBUMIN 2.1* 2.2* 2.1* 2.0* 2.0*   No results for input(s): LIPASE, AMYLASE in the last 168 hours. No results for input(s): AMMONIA in the last 168 hours. CBC: Recent Labs  Lab 06/15/18 0548 06/16/18 0423 06/17/18 0240 06/18/18 0540  WBC 6.9 6.5 6.6 7.0  HGB 7.9* 8.1* 8.0* 8.3*  HCT 25.6* 26.4* 26.5* 27.2*  MCV 97.0 97.8 98.5 99.6  PLT 143* 188 179 212   Cardiac Enzymes: No results  for input(s): CKTOTAL, CKMB, CKMBINDEX, TROPONINI in the last 168 hours. BNP: Invalid input(s): POCBNP CBG: Recent Labs  Lab 06/14/18 1736  GLUCAP 105*   D-Dimer No results for input(s): DDIMER in the last 72 hours. Hgb A1c No results for input(s): HGBA1C in the last 72 hours. Lipid Profile No results for input(s): CHOL, HDL, LDLCALC, TRIG, CHOLHDL, LDLDIRECT in the last 72 hours. Thyroid function studies No results for input(s): TSH, T4TOTAL, T3FREE, THYROIDAB in the last 72 hours.  Invalid input(s): FREET3 Anemia work up No results for input(s): VITAMINB12, FOLATE, FERRITIN, TIBC, IRON, RETICCTPCT in the last 72 hours. Urinalysis    Component Value Date/Time   COLORURINE YELLOW 05/22/2018 2024   APPEARANCEUR CLEAR 05/22/2018 2024   LABSPEC >1.046 (H) 05/22/2018 2024   PHURINE 5.0 05/22/2018 2024   GLUCOSEU NEGATIVE 05/22/2018 2024   HGBUR LARGE (A) 05/22/2018 2024   BILIRUBINUR NEGATIVE 05/22/2018 2024   KETONESUR NEGATIVE 05/22/2018 2024   PROTEINUR 30 (A) 05/22/2018 2024   NITRITE NEGATIVE 05/22/2018 2024   LEUKOCYTESUR NEGATIVE 05/22/2018 2024   Sepsis Labs Invalid input(s): PROCALCITONIN,  WBC,  LACTICIDVEN Microbiology No results found for this or any previous visit (from the past 240 hour(s)).   Time coordinating discharge:  I have spent 35 minutes face to face with the patient and on the ward discussing the patients care, assessment, plan and disposition with other care givers. >50% of the time was devoted counseling the patient about the risks and benefits of treatment/Discharge disposition and coordinating care.   SIGNED:   Damita Lack, MD  [facility] 06/21/2018, 1:36 PM Pager   If 7PM-7AM, please contact night-coverage [email or web address] Password [email or web address]

## 2018-06-21 NOTE — Progress Notes (Signed)
Patient back from IR. HD cath was removed. Patient will be D/C home.

## 2018-06-21 NOTE — Progress Notes (Signed)
CSW notes that patient is discharging home today as per recommendations from physical therapy. CSW signing off.  Percell Locus Nichollas Perusse LCSW [phone]

## 2018-06-21 NOTE — Care Management Note (Signed)
Case Management Note  Patient Details  Name: Howard Thompson MRN: [id number] Date of Birth: 01/28/1966  Subjective/Objective:   Cardiac arrest, history of myasthenia gravis, tobacco abuse, alcohol abuse, cocaine use.   Pt's cell # [phone], wife Jacqlyn Larsen) [phone]  PCP: Dr. Salvadore Dom  Action/Plan: Transition to home with home health services to follow. Pt to f/u with PCP with obtaining post hospital f/u appointment.  Expected Discharge Date:  06/21/18               Expected Discharge Plan:  Home with home health services   In-House Referral:  Clinical Social Work  Discharge planning Services  CM Consult  Post Acute Care Choice:    Choice offered to:     DME Arranged:    DME Agency:     HH Arranged:  RN, PT, OT HH Agency:  [facility]  Status of Service:  Completed, signed off  If discussed at [place] of Stay Meetings, dates discussed:    Additional Comments:  Sharin Mons, RN 06/21/2018, 3:46 PM

## 2018-06-21 NOTE — Progress Notes (Signed)
Subjective: Interval History: Patient is feeling well today. He continues to have good UOP. He states he was told he may be able to go home today and his is looking forward to when he can as he feels ready. No complaints this morning.  Objective: Vital signs in last 24 hours: Temp:  [98.2 F (36.8 C)-99.6 F (37.6 C)] 98.4 F (36.9 C) (08/26 0524) Pulse Rate:  [83-86] 83 (08/26 0524) Resp:  [20] 20 (08/25 1448) BP: (120-168)/(77-95) 168/80 (08/26 0524) SpO2:  [100 %] 100 % (08/26 0524) Weight change:   Intake/Output from previous day: 08/25 0701 - 08/26 0700 In: 340 [P.O.:340] Out: 1780 [Urine:1780] Intake/Output this shift: No intake/output data recorded.  Physical Exam  Constitutional: He is oriented to person, place, and time. He appears well-developed and well-nourished. No distress.  Cardiovascular: Normal rate, regular rhythm and intact distal pulses.  Murmur heard. Pulmonary/Chest: Effort normal and breath sounds normal. No respiratory distress.  Abdominal: Soft. Bowel sounds are normal. He exhibits no distension. There is no tenderness.  Musculoskeletal: He exhibits no edema or deformity.  Neurological: He is alert and oriented to person, place, and time.  Skin: Skin is warm and dry.   Lab Results: No results for input(s): WBC, HGB, HCT, PLT in the last 72 hours. BMET:  Recent Labs    06/20/18 0509 06/21/18 0402  NA 137 138  K 3.6 3.5  CL 106 109  CO2 24 23  GLUCOSE 91 105*  BUN 50* 54*  CREATININE 4.58* 3.36*  CALCIUM 7.8* 7.8*   No results for input(s): PTH in the last 72 hours. Iron Studies: No results for input(s): IRON, TIBC, TRANSFERRIN, FERRITIN in the last 72 hours. CBG (last 3)  No results for input(s): GLUCAP in the last 72 hours.   Studies/Results: No results found.  I have reviewed the patient's current medications.  Assessment/Plan: Acute Renal Failure:  2/2 Ischemic and toxic ATN s/p Arrest and Cath/Stent. Renal function recovering.  AVF creation deferred with renal recover. Sevelamer discontinued. Has not required HD since 8/21 with steady improvement in Cr, now 3.36. Will contact IR for [facility] removal and ensure outpatient follow up is scheduled. > K, Phos and Acid/Base good. > Mg 1.5, MgOx ordered by primary - Aranesp 228mcg/wk  - PTH 65 (upper limit of normal) - IR for TDC removal  HTN: [id number] Systolic this AM. On bystolic 2.5mg  qhs for CAD - Restart Amlodipine 10mg  Daily  Anemia: Renal Failure, ?GIB etiology unclear (Heme + 8/16), EGD 8/15 showd duodenitis, no bleed), Hgb stable on 8/23. - Aranesp 212mcg/wk - Treatment for ?GIB per primary  CAD, MI, S/P Arrest on 7/27 & ICM: EF now 40-45%. Per primary MG: Per primary team Substance abuse   LOS: 30 days   Neva Seat 06/21/2018,7:49 AM

## 2018-06-21 NOTE — Progress Notes (Addendum)
Patient was discharged home with home health by MD order; discharged instructions review and give to patient with care notes; IV DIC; HD cath on right upper chest removed in IR; patient will be escorted to the car by staff via wheelchair.

## 2018-06-21 NOTE — Progress Notes (Signed)
Inpatient Rehabilitation  Note that patient is discharging home today with home health therapy follow-up as recommended by therapy.  Will sign off at this time.  Call if questions.    Carmelia Roller., CCC/SLP Admission Coordinator  [place]  Cell [phone]

## 2018-06-22 ENCOUNTER — Encounter (HOSPITAL_COMMUNITY): Payer: Self-pay | Admitting: *Deleted

## 2018-06-23 ENCOUNTER — Ambulatory Visit: Admit: 2018-06-23 | Payer: No Typology Code available for payment source | Admitting: Vascular Surgery

## 2018-06-23 HISTORY — DX: Atherosclerotic heart disease of native coronary artery without angina pectoris: I25.10

## 2018-06-23 HISTORY — DX: Thrombocytopenia, unspecified: D69.6

## 2018-06-23 HISTORY — DX: Hyperlipidemia, unspecified: E78.5

## 2018-06-23 HISTORY — DX: Myasthenia gravis without (acute) exacerbation: G70.00

## 2018-06-23 HISTORY — DX: Cardiac arrest, cause unspecified: I46.9

## 2018-06-23 HISTORY — DX: Personal history of other medical treatment: Z92.89

## 2018-06-23 HISTORY — DX: Acute myocardial infarction, unspecified: I21.9

## 2018-06-23 HISTORY — DX: Duodenitis without bleeding: K29.80

## 2018-06-23 SURGERY — ARTERIOVENOUS (AV) FISTULA CREATION
Anesthesia: Monitor Anesthesia Care | Laterality: Left

## 2018-07-02 ENCOUNTER — Telehealth (HOSPITAL_COMMUNITY): Payer: Self-pay

## 2018-07-02 NOTE — Telephone Encounter (Signed)
Attempted to contact patient in regards to Cardiac Rehab and insurance - call could not be completed. Sending letter.

## 2018-07-08 ENCOUNTER — Inpatient Hospital Stay: Payer: No Typology Code available for payment source | Admitting: Family Medicine

## 2018-07-09 ENCOUNTER — Telehealth (HOSPITAL_COMMUNITY): Payer: Self-pay

## 2018-07-09 NOTE — Telephone Encounter (Signed)
2nd attempt to contact patient in regards to Cardiac - could not leave vm

## 2018-07-14 ENCOUNTER — Inpatient Hospital Stay (HOSPITAL_COMMUNITY)
Admission: EM | Admit: 2018-07-14 | Discharge: 2018-07-16 | DRG: 378 | Disposition: A | Discharge status: Home / Self Care | Payer: No Typology Code available for payment source | Source: Ambulatory Visit | Attending: Internal Medicine | Admitting: Internal Medicine

## 2018-07-14 ENCOUNTER — Other Ambulatory Visit: Payer: Self-pay

## 2018-07-14 ENCOUNTER — Encounter (HOSPITAL_COMMUNITY): Payer: Self-pay | Admitting: Emergency Medicine

## 2018-07-14 DIAGNOSIS — Z8249 Family history of ischemic heart disease and other diseases of the circulatory system: Secondary | ICD-10-CM

## 2018-07-14 DIAGNOSIS — D62 Acute posthemorrhagic anemia: Secondary | ICD-10-CM

## 2018-07-14 DIAGNOSIS — I252 Old myocardial infarction: Secondary | ICD-10-CM

## 2018-07-14 DIAGNOSIS — D500 Iron deficiency anemia secondary to blood loss (chronic): Secondary | ICD-10-CM | POA: Diagnosis present

## 2018-07-14 DIAGNOSIS — D509 Iron deficiency anemia, unspecified: Secondary | ICD-10-CM | POA: Diagnosis present

## 2018-07-14 DIAGNOSIS — D696 Thrombocytopenia, unspecified: Secondary | ICD-10-CM | POA: Diagnosis present

## 2018-07-14 DIAGNOSIS — D649 Anemia, unspecified: Secondary | ICD-10-CM | POA: Diagnosis not present

## 2018-07-14 DIAGNOSIS — F1490 Cocaine use, unspecified, uncomplicated: Secondary | ICD-10-CM | POA: Diagnosis present

## 2018-07-14 DIAGNOSIS — Z7982 Long term (current) use of aspirin: Secondary | ICD-10-CM

## 2018-07-14 DIAGNOSIS — Z79899 Other long term (current) drug therapy: Secondary | ICD-10-CM

## 2018-07-14 DIAGNOSIS — K921 Melena: Principal | ICD-10-CM | POA: Diagnosis present

## 2018-07-14 DIAGNOSIS — I10 Essential (primary) hypertension: Secondary | ICD-10-CM | POA: Diagnosis present

## 2018-07-14 DIAGNOSIS — Z820 Family history of epilepsy and other diseases of the nervous system: Secondary | ICD-10-CM

## 2018-07-14 DIAGNOSIS — F1410 Cocaine abuse, uncomplicated: Secondary | ICD-10-CM | POA: Diagnosis present

## 2018-07-14 DIAGNOSIS — Z8674 Personal history of sudden cardiac arrest: Secondary | ICD-10-CM

## 2018-07-14 DIAGNOSIS — F17210 Nicotine dependence, cigarettes, uncomplicated: Secondary | ICD-10-CM | POA: Diagnosis present

## 2018-07-14 DIAGNOSIS — K31819 Angiodysplasia of stomach and duodenum without bleeding: Secondary | ICD-10-CM | POA: Diagnosis present

## 2018-07-14 DIAGNOSIS — R000 Tachycardia, unspecified: Secondary | ICD-10-CM | POA: Diagnosis present

## 2018-07-14 DIAGNOSIS — I2510 Atherosclerotic heart disease of native coronary artery without angina pectoris: Secondary | ICD-10-CM | POA: Diagnosis present

## 2018-07-14 DIAGNOSIS — E785 Hyperlipidemia, unspecified: Secondary | ICD-10-CM | POA: Diagnosis present

## 2018-07-14 DIAGNOSIS — I469 Cardiac arrest, cause unspecified: Secondary | ICD-10-CM | POA: Diagnosis present

## 2018-07-14 DIAGNOSIS — Z955 Presence of coronary angioplasty implant and graft: Secondary | ICD-10-CM

## 2018-07-14 DIAGNOSIS — R195 Other fecal abnormalities: Secondary | ICD-10-CM

## 2018-07-14 DIAGNOSIS — D122 Benign neoplasm of ascending colon: Secondary | ICD-10-CM | POA: Diagnosis present

## 2018-07-14 DIAGNOSIS — I4901 Ventricular fibrillation: Secondary | ICD-10-CM | POA: Diagnosis present

## 2018-07-14 DIAGNOSIS — Z7902 Long term (current) use of antithrombotics/antiplatelets: Secondary | ICD-10-CM

## 2018-07-14 DIAGNOSIS — I249 Acute ischemic heart disease, unspecified: Secondary | ICD-10-CM | POA: Diagnosis present

## 2018-07-14 DIAGNOSIS — J449 Chronic obstructive pulmonary disease, unspecified: Secondary | ICD-10-CM | POA: Diagnosis present

## 2018-07-14 DIAGNOSIS — G7000 Myasthenia gravis without (acute) exacerbation: Secondary | ICD-10-CM | POA: Diagnosis present

## 2018-07-14 DIAGNOSIS — F1910 Other psychoactive substance abuse, uncomplicated: Secondary | ICD-10-CM | POA: Diagnosis present

## 2018-07-14 DIAGNOSIS — I255 Ischemic cardiomyopathy: Secondary | ICD-10-CM | POA: Diagnosis present

## 2018-07-14 HISTORY — DX: Alcohol abuse, uncomplicated: F10.10

## 2018-07-14 HISTORY — DX: Ventricular fibrillation: I49.01

## 2018-07-14 HISTORY — DX: Peripheral vascular disease, unspecified: I73.9

## 2018-07-14 HISTORY — DX: Tobacco use: Z72.0

## 2018-07-14 HISTORY — DX: Anemia, unspecified: D64.9

## 2018-07-14 HISTORY — DX: Acute and subacute hepatic failure without coma: K72.00

## 2018-07-14 HISTORY — DX: Ischemic cardiomyopathy: I25.5

## 2018-07-14 HISTORY — DX: Chronic obstructive pulmonary disease, unspecified: J44.9

## 2018-07-14 HISTORY — DX: Acute kidney failure, unspecified: N17.9

## 2018-07-14 HISTORY — DX: Cocaine abuse, uncomplicated: F14.10

## 2018-07-14 HISTORY — DX: Gastrointestinal hemorrhage, unspecified: K92.2

## 2018-07-14 LAB — IRON AND TIBC
IRON: 11 ug/dL — AB (ref 45–182)
Saturation Ratios: 2 % — ABNORMAL LOW (ref 17.9–39.5)
TIBC: 515 ug/dL — ABNORMAL HIGH (ref 250–450)
UIBC: 504 ug/dL

## 2018-07-14 LAB — BASIC METABOLIC PANEL
Anion gap: 9 (ref 5–15)
BUN: 15 mg/dL (ref 6–20)
CHLORIDE: 106 mmol/L (ref 98–111)
CO2: 23 mmol/L (ref 22–32)
Calcium: 8.9 mg/dL (ref 8.9–10.3)
Creatinine, Ser: 1.4 mg/dL — ABNORMAL HIGH (ref 0.61–1.24)
GFR calc Af Amer: >60 mL/min (ref >60)
GFR calc non Af Amer: 57 mL/min — ABNORMAL LOW (ref >60)
GLUCOSE: 105 mg/dL — AB (ref 70–99)
POTASSIUM: 4.1 mmol/L (ref 3.5–5.1)
Sodium: 138 mmol/L (ref 135–145)

## 2018-07-14 LAB — CBC
HEMATOCRIT: 16.1 % — AB (ref 39.0–52.0)
Hemoglobin: 4.4 g/dL — CL (ref 13.0–17.0)
MCH: 26.5 pg (ref 26.0–34.0)
MCHC: 27.3 g/dL — AB (ref 30.0–36.0)
MCV: 97 fL (ref 78.0–100.0)
Platelets: 429 10*3/uL — ABNORMAL HIGH (ref 150–400)
RBC: 1.66 MIL/uL — ABNORMAL LOW (ref 4.22–5.81)
RDW: 19.3 % — AB (ref 11.5–15.5)
WBC: 8.4 10*3/uL (ref 4.0–10.5)

## 2018-07-14 LAB — FOLATE: Folate: 38 ng/mL (ref >5.9)

## 2018-07-14 LAB — I-STAT TROPONIN, ED: Troponin i, poc: 0 ng/mL (ref 0.00–0.08)

## 2018-07-14 LAB — FERRITIN: FERRITIN: 39 ng/mL (ref 24–336)

## 2018-07-14 LAB — RETICULOCYTES
RBC.: 1.51 MIL/uL — ABNORMAL LOW (ref 4.22–5.81)
RETIC CT PCT: 7.1 % — AB (ref 0.4–3.1)
Retic Count, Absolute: 107.2 10*3/uL (ref 19.0–186.0)

## 2018-07-14 LAB — VITAMIN B12: Vitamin B-12: 518 pg/mL (ref 180–914)

## 2018-07-14 LAB — PREPARE RBC (CROSSMATCH)

## 2018-07-14 MED ORDER — NEBIVOLOL HCL 2.5 MG PO TABS
2.5000 mg | ORAL_TABLET | Freq: Every day | ORAL | Status: DC
  Administered 2018-07-14 – 2018-07-15 (×2): 2.5 mg via ORAL
  Filled 2018-07-14 (×2): qty 1

## 2018-07-14 MED ORDER — FOLIC ACID 1 MG PO TABS
1.0000 mg | ORAL_TABLET | Freq: Every day | ORAL | Status: DC
  Administered 2018-07-14 – 2018-07-16 (×3): 1 mg via ORAL
  Filled 2018-07-14 (×3): qty 1

## 2018-07-14 MED ORDER — LORAZEPAM 2 MG/ML IJ SOLN
1.0000 mg | Freq: Four times a day (QID) | INTRAMUSCULAR | Status: DC | PRN
  Administered 2018-07-15: 1 mg via INTRAVENOUS
  Filled 2018-07-14: qty 1

## 2018-07-14 MED ORDER — NITROGLYCERIN 0.4 MG SL SUBL: 0.4000 mg | SUBLINGUAL_TABLET | SUBLINGUAL | Status: DC | PRN

## 2018-07-14 MED ORDER — LORAZEPAM 1 MG PO TABS: 1.0000 mg | ORAL_TABLET | Freq: Four times a day (QID) | ORAL | Status: DC | PRN

## 2018-07-14 MED ORDER — FUROSEMIDE 10 MG/ML IJ SOLN
20.0000 mg | Freq: Once | INTRAMUSCULAR | Status: AC
  Administered 2018-07-14: 20 mg via INTRAVENOUS
  Filled 2018-07-14: qty 2

## 2018-07-14 MED ORDER — ATORVASTATIN CALCIUM 40 MG PO TABS
40.0000 mg | ORAL_TABLET | Freq: Every day | ORAL | Status: DC
  Administered 2018-07-14 – 2018-07-15 (×2): 40 mg via ORAL
  Filled 2018-07-14 (×2): qty 1

## 2018-07-14 MED ORDER — FUROSEMIDE 20 MG PO TABS
20.0000 mg | ORAL_TABLET | Freq: Two times a day (BID) | ORAL | Status: DC
  Administered 2018-07-15 – 2018-07-16 (×3): 20 mg via ORAL
  Filled 2018-07-14 (×3): qty 1

## 2018-07-14 MED ORDER — SODIUM CHLORIDE 0.9% IV SOLUTION
Freq: Once | INTRAVENOUS | Status: AC
  Administered 2018-07-14: 10 mL/h via INTRAVENOUS

## 2018-07-14 MED ORDER — VITAMIN B-1 100 MG PO TABS
100.0000 mg | ORAL_TABLET | Freq: Every day | ORAL | Status: DC
  Administered 2018-07-14 – 2018-07-16 (×3): 100 mg via ORAL
  Filled 2018-07-14 (×3): qty 1

## 2018-07-14 MED ORDER — PANTOPRAZOLE SODIUM 40 MG PO TBEC
40.0000 mg | DELAYED_RELEASE_TABLET | Freq: Two times a day (BID) | ORAL | Status: DC
  Administered 2018-07-14 – 2018-07-15 (×2): 40 mg via ORAL
  Filled 2018-07-14 (×2): qty 1

## 2018-07-14 MED ORDER — QUETIAPINE FUMARATE 50 MG PO TABS
50.0000 mg | ORAL_TABLET | Freq: Every day | ORAL | Status: DC
  Administered 2018-07-14 – 2018-07-15 (×2): 50 mg via ORAL
  Filled 2018-07-14 (×2): qty 1

## 2018-07-14 MED ORDER — ADULT MULTIVITAMIN W/MINERALS CH
1.0000 | ORAL_TABLET | Freq: Every day | ORAL | Status: DC
  Administered 2018-07-14 – 2018-07-16 (×3): 1 via ORAL
  Filled 2018-07-14 (×3): qty 1

## 2018-07-14 NOTE — ED Notes (Signed)
The pt was sent from his kidney doctor today withg a low hgb  No sign of bleeding anywhere according to the pt.  Kidney  Problems.  Sl dizziness when he stands

## 2018-07-14 NOTE — ED Notes (Signed)
Pt is not on dialysis

## 2018-07-14 NOTE — ED Triage Notes (Signed)
Pt sent from home by dialysis dr- went for follow up and had labs drawn- has not had dialysis, recent stent 6 weeks.  Hgb- 4.5 today

## 2018-07-14 NOTE — ED Triage Notes (Signed)
No answer

## 2018-07-14 NOTE — Progress Notes (Signed)
Report received. Bed ready.  

## 2018-07-14 NOTE — H&P (Addendum)
History and Physical    Darell Saputo UEA:[id number] DOB: 1965-11-13 DOA: 07/14/2018  PCP: Jamesetta Geralds, MD   Patient coming from: Home  Chief Complaint: Lightheadness, low Hemoglobin  HPI: Howard Thompson is a 52 y.o. male with medical history significant for recent cardiac arrest, Polysubstance abuse- Cocaine, Tobacco and alcohol. Patient presented to his nephrologist office today for routine follow up, hgb was found to be low at 4.5, patient was subsequently sent to the ED. Patient report lightheadedness, and generalized weakness.  He denies chest pain shortness of breath at rest or with activity. He reports darker stools since hospital discharge. No Abdominal pain. No vomiting blood. He reports compliance with his [place] and aspirin- last dose this a.m. Denies NSAID use.   Recent long hospitalization 05/22/18- 06/21/18, from Ventricular fibrillation, with emergent cath showing 100% LAD occlusion requiring stent, stay was medicated by of acute renal failure requiring HD, acute anemia with melena requiring multiple blood transfusions, possible GI bleed. EGD- 06/10/18 showed duodenitis, no obvious source of bleed, agitation requiring precedex drip.   Last cocaine use- ~1 week ago, Alcohol- reports 2 beers since discharge ( Prior to recent admission admits to heavy alcohol intake), trying to quit cig smoking- (says he relapsed and started smoking again)  ED Course: Tachycardia to 106, otherwise stable vitals. Hgb 4.4.  Mild thrombocytosis for 2.9.  Improving creatinine now 1.4, from recent discharge creatinine 3.3.  Hospitalist was called to admit for symptomatic anemia.  Review of Systems: As per HPI all other systems reviewed and negative  Past Medical History:  Diagnosis Date  . Cardiac arrest ([place]) 05/22/2018  . Cardiomyopathy ([place])   . Coronary artery disease   . Duodenitis   . History of blood transfusion   . Hyperlipidemia   . Myasthenia gravis ([place])   . Myocardial  infarction ([place]) 05/22/2018   STEMI  . Thrombocytopenia ([place]) 04/2018    Past Surgical History:  Procedure Laterality Date  . ATHERECTOMY    . CARDIAC CATHETERIZATION  05/22/2018   stent  . CORONARY ANGIOPLASTY    . CORONARY STENT INTERVENTION N/A 05/22/2018   Procedure: CORONARY STENT INTERVENTION;  Surgeon: Lorretta Harp, MD;  Location: [place] [facility];  Service: Cardiovascular;  Laterality: N/A;  . ESOPHAGOGASTRODUODENOSCOPY (EGD) WITH PROPOFOL N/A 06/10/2018   Procedure: ESOPHAGOGASTRODUODENOSCOPY (EGD) WITH PROPOFOL;  Surgeon: Ladene Artist, MD;  Location: [facility] ENDOSCOPY;  Service: Endoscopy;  Laterality: N/A;  . FEMORAL ARTERY - POPLITEAL ARTERY BYPASS GRAFT    . IR FLUORO GUIDE CV LINE RIGHT  05/31/2018  . IR REMOVAL TUN CV CATH W/O FL  06/21/2018  . IR US GUIDE VASC ACCESS RIGHT  05/31/2018  . LEFT HEART CATH AND CORONARY ANGIOGRAPHY N/A 05/22/2018   Procedure: LEFT HEART CATH AND CORONARY ANGIOGRAPHY;  Surgeon: Lorretta Harp, MD;  Location: [place] [facility];  Service: Cardiovascular;  Laterality: N/A;     reports that he has been smoking cigarettes. He has been smoking about 1.00 pack per day. He uses smokeless tobacco. He reports that he does not use drugs. His alcohol history is not on file.  No Known Allergies  Family history of hypertension.  Prior to Admission medications   Medication Sig Start Date End Date Taking? Authorizing Provider  amLODipine (NORVASC) 10 MG tablet Take 1 tablet (10 mg total) by mouth daily. Patient taking differently: Take 5 mg by mouth daily.  06/22/18 07/22/18 Yes Amin, Jeanella Flattery, MD  aspirin 81 MG chewable tablet Chew 1 tablet (81  mg total) by mouth daily. 06/22/18 07/22/18 Yes Amin, Jeanella Flattery, MD  atorvastatin (LIPITOR) 40 MG tablet Take 1 tablet (40 mg total) by mouth daily at 6 PM. Patient taking differently: Take 40 mg by mouth at bedtime.  06/21/18 07/21/18 Yes Amin, Jeanella Flattery, MD  furosemide (LASIX) 20 MG tablet Take 20 mg by  mouth 2 (two) times daily. 06/29/18  Yes [provider]  multivitamin (RENA-VIT) TABS tablet Take 1 tablet by mouth at bedtime. 06/21/18 07/21/18 Yes Amin, Ankit Chirag, MD  nebivolol (BYSTOLIC) 2.5 MG tablet Take 1 tablet (2.5 mg total) by mouth at bedtime. 06/21/18 07/21/18 Yes Amin, Jeanella Flattery, MD  nitroGLYCERIN (NITROSTAT) 0.4 MG SL tablet Place 0.4 mg under the tongue every 5 (five) minutes as needed for chest pain.   Yes [provider]  pantoprazole (PROTONIX) 40 MG tablet Take 1 tablet (40 mg total) by mouth 2 (two) times daily. 06/21/18 07/21/18 Yes Amin, Jeanella Flattery, MD  QUEtiapine (SEROQUEL) 50 MG tablet Take 1 tablet (50 mg total) by mouth 2 (two) times daily. Patient taking differently: Take 50 mg by mouth at bedtime.  06/21/18 07/21/18 Yes Amin, Jeanella Flattery, MD  ticagrelor (BRILINTA) 90 MG TABS tablet Take 1 tablet (90 mg total) by mouth 2 (two) times daily. 06/21/18 07/21/18 Yes Damita Lack, MD    Physical Exam: Vitals:   07/14/18 1617 07/14/18 1630 07/14/18 1700 07/14/18 1730  BP: 135/65 140/69 (!) 147/83 131/67  Pulse: 95  (!) 101 (!) 103  Resp: 14 20 19 18   Temp:      TempSrc:      SpO2: 100%  100% 100%  Weight:      Height:        Constitutional: Pale, calm, comfortable Vitals:   07/14/18 1617 07/14/18 1630 07/14/18 1700 07/14/18 1730  BP: 135/65 140/69 (!) 147/83 131/67  Pulse: 95  (!) 101 (!) 103  Resp: 14 20 19 18   Temp:      TempSrc:      SpO2: 100%  100% 100%  Weight:      Height:       Eyes: PERRL, lids and conjunctivae normal ENMT: Mucous membranes are moist. Posterior pharynx clear of any exudate or lesions. Neck: normal, supple, no masses, no thyromegaly Respiratory: clear to auscultation bilaterally, no wheezing, no crackles. Normal respiratory effort. No accessory muscle use.  Cardiovascular: Mildly tachycardic but regular rate and rhythm, 2/6 systolic murmurs- ?flow murmur / rubs / gallops. Trace bilat Le swelling L>R. 2+ pedal  pulses.  Abdomen: no tenderness, no masses palpated. No hepatosplenomegaly. Bowel sounds positive.  Musculoskeletal: no clubbing / cyanosis. No joint deformity upper and lower extremities. Good ROM, no contractures. Normal muscle tone.  Skin: no rashes, lesions, ulcers. No induration Neurologic: CN 2-12 grossly intact. Strength 5/5 in all 4.  Psychiatric: Normal judgment and insight. Alert and oriented x 3. Normal mood.   Labs on Admission: I have personally reviewed following labs and imaging studies  CBC: Recent Labs  Lab 07/14/18 1451  WBC 8.4  HGB 4.4*  HCT 16.1*  MCV 97.0  PLT 169*   Basic Metabolic Panel: Recent Labs  Lab 07/14/18 1451  NA 138  K 4.1  CL 106  CO2 23  GLUCOSE 105*  BUN 15  CREATININE 1.40*  CALCIUM 8.9   Urine analysis:    Component Value Date/Time   COLORURINE YELLOW 05/22/2018 2024   APPEARANCEUR CLEAR 05/22/2018 2024   LABSPEC >1.046 (H) 05/22/2018 2024  PHURINE 5.0 05/22/2018 2024   GLUCOSEU NEGATIVE 05/22/2018 2024   HGBUR LARGE (A) 05/22/2018 2024   BILIRUBINUR NEGATIVE 05/22/2018 2024   KETONESUR NEGATIVE 05/22/2018 2024   PROTEINUR 30 (A) 05/22/2018 2024   NITRITE NEGATIVE 05/22/2018 2024   LEUKOCYTESUR NEGATIVE 05/22/2018 2024    Radiological Exams on Admission: No results found.  EKG: None.   Assessment/Plan Principal Problem:   Symptomatic anemia Active Problems:   ACS (acute coronary syndrome) (HCC)   Ventricular fibrillation (HCC)   Cardiac arrest (HCC)   Substance abuse (HCC)   Ischemic cardiomyopathy   Acute symptomatic anemia-hgb 4.4, with increased RDW. Generalized weakness lightheadedness.  Stable vitals.  Reports dark stools. Initial hemoglobin on recent admission 13.  Multiple transfusions recent admission.  EGD 8/15-mild duodenitis, no obvious source of bleeding identified.  Had colonoscopy. -Transfuse 2 units -IV Lasix 20 mg between transfusions -Hold Brilinta and aspirin for now-last dose, this a.m. -Iron  panel, 12 folate -Stool FOBT -Consult GI in a.m. -N.p.o. Midnight  Recent cardiac arrest, ventricular fibrillation with ACS-urgent cardiac cath showing 100% stenosed LAD, DES placed.  EF initially 25% improved to 40-45% prior to discharge. -Hold Brilinta and aspirin for now, last dose this a.m.  Ischemic cardiomyopathy- EF initially 25% improved to 40-45% prior to discharge. -Continue home Lasix, nebivolol, atorvastatin.  Recent severe oliguric acute renal injury-2/2 cardiogenic shock and resultant ATN. Requiring HD on recent admission, HD discontinued prior to discharge.  Renal function has significantly improved, Cr- 1.4, with GFR 57.   Polysubstance abuse-cocaine alcohol tobacco.  Patient trying to quit but has relapsed since discharge.  Reports 2 beers since discharge.  Cocaine use 1 week ago.  Now smoking 5 cigarettes daily. - CIWA PRN, vitamins thiamine folate  HTN-stable -Continue nebivolol -Hold aspirin  HIV as part of routine health screening  DVT prophylaxis: SCds Code Status: Full Family Communication: Girlfriend at bedside Disposition Plan: Per rounding team Consults called: GI consult in a.m Admission status: Obs, Tele   Bethena Roys MD [facility] Pager [phone] From 6PM-2AM.  Otherwise please contact night-coverage [email or web address] Password [facility]  07/14/2018, 8:05 PM

## 2018-07-14 NOTE — ED Notes (Signed)
Report called to rn on 5 m 

## 2018-07-14 NOTE — ED Notes (Signed)
Critical HGB results given to Dr. Alvino Chapel.

## 2018-07-14 NOTE — ED Provider Notes (Addendum)
[facility] EMERGENCY DEPARTMENT Provider Note   CSN: [id number] Arrival date & time: 07/14/18  1417     History   Chief Complaint Chief Complaint  Patient presents with  . hgb 4.5    HPI Howard Thompson is a 52 y.o. male.  HPI Patient presents with anemia.  Seen at his nephrologist for follow-up on his previous dialysis.  Reportedly kidney function is much improved but hemoglobin has gone down into the fours.  No blood in stool or black stool.  States he has felt more dizzy and lightheaded.  States his cardiologist had been decreasing his medicines thinking it was blood pressure related.  He is on Brilinta.  Had some mild anemia while in the hospital.  No black stool or blood in the stool. Past Medical History:  Diagnosis Date  . Acute renal failure ([place])    a. a. during 7-05/2018 adm for cardiac arrest, requiring HD temporarily.  . Alcohol abuse   . Anemia   . Cardiac arrest ([place]) 05/22/2018  . Cocaine abuse ([place])   . COPD (chronic obstructive pulmonary disease) ([place])   . Coronary artery disease   . Duodenitis   . GI bleed   . History of blood transfusion   . Hyperlipidemia   . Ischemic cardiomyopathy    a. EF <25% at time of cath/arrest 04/2018, improved to 40-45% by f/u echo 06/09/18.  . Myasthenia gravis ([place])   . Myocardial infarction ([place]) 05/22/2018   STEMI  . PAD (peripheral artery disease) ([place])    a. femoral bypass  . Shock liver    a. during 7-05/2018 adm for cardiac arrest.  . Thrombocytopenia ([place]) 04/2018  . Tobacco abuse   . Ventricular fibrillation [facility])     Patient Active Problem List   Diagnosis Date Noted  . Acute blood loss anemia   . Long term current use of antithrombotics/antiplatelets   . Symptomatic anemia 07/14/2018  . Protein-calorie malnutrition, severe 06/11/2018  . Melena   . Heme + stool   . Anemia due to chronic blood loss   . Hyperlipidemia LDL goal <70   . Acute respiratory failure ([place])   . AKI (acute kidney  injury) ([place])   . Ischemic cardiomyopathy   . Encephalopathy acute   . History of ETT   . Thrombocytopenia ([place])   . Substance abuse ([place])   . Cocaine abuse ([place])   . Shock ([place])   . Elevated LFTs   . Acute renal failure ([place])   . Ventricular fibrillation ([place])   . Cardiac arrest ([place])   . ACS (acute coronary syndrome) ([place]) 05/22/2018  . ST elevation myocardial infarction (STEMI) ([place]) 05/22/2018    Past Surgical History:  Procedure Laterality Date  . ATHERECTOMY    . BIOPSY  07/16/2018   Procedure: BIOPSY;  Surgeon: Gatha Mayer, MD;  Location: [facility] ENDOSCOPY;  Service: Endoscopy;;  . CARDIAC CATHETERIZATION  05/22/2018   stent  . COLONOSCOPY WITH PROPOFOL N/A 07/16/2018   Procedure: COLONOSCOPY WITH PROPOFOL;  Surgeon: Gatha Mayer, MD;  Location: [facility] ENDOSCOPY;  Service: Endoscopy;  Laterality: N/A;  . CORONARY ANGIOPLASTY    . CORONARY STENT INTERVENTION N/A 05/22/2018   Procedure: CORONARY STENT INTERVENTION;  Surgeon: Lorretta Harp, MD;  Location: [place] [facility] LAB;  Service: Cardiovascular;  Laterality: N/A;  . ESOPHAGOGASTRODUODENOSCOPY (EGD) WITH PROPOFOL N/A 06/10/2018   Procedure: ESOPHAGOGASTRODUODENOSCOPY (EGD) WITH PROPOFOL;  Surgeon: Ladene Artist, MD;  Location[facility] ENDOSCOPY;  Service: Endoscopy;  Laterality:  N/A;  . ESOPHAGOGASTRODUODENOSCOPY (EGD) WITH PROPOFOL N/A 07/16/2018   Procedure: ESOPHAGOGASTRODUODENOSCOPY (EGD) WITH PROPOFOL;  Surgeon: Gatha Mayer, MD;  Location: [place];  Service: Endoscopy;  Laterality: N/A;  . FEMORAL ARTERY - POPLITEAL ARTERY BYPASS GRAFT    . HOT HEMOSTASIS N/A 07/16/2018   Procedure: HOT HEMOSTASIS (ARGON PLASMA COAGULATION/BICAP);  Surgeon: Gatha Mayer, MD;  Location: [facility] ENDOSCOPY;  Service: Endoscopy;  Laterality: N/A;  . IR FLUORO GUIDE CV LINE RIGHT  05/31/2018  . IR REMOVAL TUN CV CATH W/O FL  06/21/2018  . IR US GUIDE VASC ACCESS RIGHT  05/31/2018  . LEFT HEART CATH AND CORONARY ANGIOGRAPHY N/A 05/22/2018    Procedure: LEFT HEART CATH AND CORONARY ANGIOGRAPHY;  Surgeon: Lorretta Harp, MD;  Location: [place] [facility];  Service: Cardiovascular;  Laterality: N/A;        Home Medications    Prior to Admission medications   Medication Sig Start Date End Date Taking? Authorizing Provider  amLODipine (NORVASC) 10 MG tablet Take 1 tablet (10 mg total) by mouth daily. Patient taking differently: Take 5 mg by mouth daily.  06/22/18 07/22/18 Yes Amin, Jeanella Flattery, MD  atorvastatin (LIPITOR) 40 MG tablet Take 1 tablet (40 mg total) by mouth daily at 6 PM. Patient taking differently: Take 40 mg by mouth at bedtime.  06/21/18 07/21/18 Yes Amin, Jeanella Flattery, MD  furosemide (LASIX) 20 MG tablet Take 20 mg by mouth 2 (two) times daily. 06/29/18  Yes [provider]  nebivolol (BYSTOLIC) 2.5 MG tablet Take 1 tablet (2.5 mg total) by mouth at bedtime. 06/21/18 07/21/18 Yes Amin, Jeanella Flattery, MD  nitroGLYCERIN (NITROSTAT) 0.4 MG SL tablet Place 0.4 mg under the tongue every 5 (five) minutes as needed for chest pain.   Yes [provider]  pantoprazole (PROTONIX) 40 MG tablet Take 1 tablet (40 mg total) by mouth 2 (two) times daily. 06/21/18 07/21/18 Yes Amin, Jeanella Flattery, MD  QUEtiapine (SEROQUEL) 50 MG tablet Take 1 tablet (50 mg total) by mouth 2 (two) times daily. Patient taking differently: Take 50 mg by mouth at bedtime.  06/21/18 07/21/18 Yes Amin, Jeanella Flattery, MD  clopidogrel (PLAVIX) 75 MG tablet Take 1 tablet (75 mg total) by mouth daily. 07/17/18 09/15/18  Hosie Poisson, MD  folic acid (FOLVITE) 1 MG tablet Take 1 tablet (1 mg total) by mouth daily. 07/17/18   Hosie Poisson, MD  Multiple Vitamin (MULTIVITAMIN WITH MINERALS) TABS tablet Take 1 tablet by mouth daily. 07/17/18   Hosie Poisson, MD  thiamine 100 MG tablet Take 1 tablet (100 mg total) by mouth daily. 07/17/18   Hosie Poisson, MD    Family History Family History  Problem Relation Age of Onset  . Alzheimer's disease Father      Social History Social History   Tobacco Use  . Smoking status: Current Every Day Smoker    Packs/day: 1.00    Types: Cigarettes  . Smokeless tobacco: Current User  Substance Use Topics  . Alcohol use: Not on file  . Drug use: Never     Allergies   Patient has no known allergies.   Review of Systems Review of Systems  Constitutional: Negative for appetite change and fever.  Cardiovascular: Negative for chest pain.  Gastrointestinal: Negative for abdominal pain.  Genitourinary: Negative for flank pain.  Musculoskeletal: Negative for back pain.  Skin: Negative for rash.  Neurological: Positive for light-headedness.  Psychiatric/Behavioral: Negative for confusion.     Physical Exam Updated Vital Signs  BP 125/64   Pulse 73   Temp 97.6 F (36.4 C) (Oral)   Resp 12   Ht 5\' 9"  (1.753 m)   Wt 64.6 kg   SpO2 100%   BMI 21.03 kg/m   Physical Exam  Constitutional: He appears well-developed.  HENT:  Head: Atraumatic.  Eyes: Pupils are equal, round, and reactive to light.  Cardiovascular: Normal rate.  Pulmonary/Chest: Effort normal. He exhibits no tenderness.  Abdominal: There is no tenderness.  Musculoskeletal: He exhibits no edema.  Neurological: He is alert.  Skin: Skin is warm. Capillary refill takes less than 2 seconds.  Psychiatric: He has a normal mood and affect.     ED Treatments / Results  Labs (all labs ordered are listed, but only abnormal results are displayed) Labs Reviewed  BASIC METABOLIC PANEL - Abnormal; Notable for the following components:      Result Value   Glucose, Bld 105 (*)    Creatinine, Ser 1.40 (*)    GFR calc non Af Amer 57 (*)    All other components within normal limits  CBC - Abnormal; Notable for the following components:   RBC 1.66 (*)    Hemoglobin 4.4 (*)    HCT 16.1 (*)    MCHC 27.3 (*)    RDW 19.3 (*)    Platelets 429 (*)    All other components within normal limits  IRON AND TIBC - Abnormal; Notable for the  following components:   Iron 11 (*)    TIBC 515 (*)    Saturation Ratios 2 (*)    All other components within normal limits  RETICULOCYTES - Abnormal; Notable for the following components:   Retic Ct Pct 7.1 (*)    RBC. 1.51 (*)    All other components within normal limits  CBC - Abnormal; Notable for the following components:   RBC 2.17 (*)    Hemoglobin 6.1 (*)    HCT 20.0 (*)    RDW 16.8 (*)    All other components within normal limits  POCT I-STAT 4, (NA,K, GLUC, HGB,HCT) - Abnormal; Notable for the following components:   HCT 26.0 (*)    Hemoglobin 8.8 (*)    All other components within normal limits  VITAMIN B12  FOLATE  FERRITIN  HIV ANTIBODY (ROUTINE TESTING W REFLEX)  I-STAT TROPONIN, ED  TYPE AND SCREEN  PREPARE RBC (CROSSMATCH)  PREPARE RBC (CROSSMATCH)  SURGICAL PATHOLOGY    EKG None  Radiology No results found.  Procedures Procedures (including critical care time)  Medications Ordered in ED Medications  0.9 %  sodium chloride infusion (Manually program via Guardrails IV Fluids) ( Intravenous Transfusing/Transfer 07/14/18 1939)  furosemide (LASIX) injection 20 mg (20 mg Intravenous Given 07/14/18 1929)  bisacodyl (DULCOLAX) EC tablet 20 mg (20 mg Oral Given 07/15/18 1801)  metoCLOPramide (REGLAN) injection 10 mg (10 mg Intravenous Given 07/15/18 1803)    Followed by  metoCLOPramide (REGLAN) injection 10 mg (10 mg Intravenous Given 07/16/18 0116)  polyethylene glycol powder (GLYCOLAX/MIRALAX) container 255 g (255 g Oral Given 07/15/18 1802)  lactated ringers infusion ( Intravenous Anesthesia Volume Adjustment 07/16/18 0836)  ferumoxytol (FERAHEME) 510 mg in sodium chloride 0.9 % 100 mL IVPB (510 mg Intravenous New Bag/Given 07/16/18 1055)     Initial Impression / Assessment and Plan / ED Course  I have reviewed the triage vital signs and the nursing notes.  Pertinent labs & imaging results that were available during my care of the patient were reviewed by  me  and considered in my medical decision making (see chart for details).     Patient with anemia.  Denies GI bleeding.  May be secondary to his renal disease although that has improved.  Will admit to internal medicine.  CRITICAL CARE Performed by: Davonna Belling Total critical care time: 30 minutes Critical care time was exclusive of separately billable procedures and treating other patients. Critical care was necessary to treat or prevent imminent or life-threatening deterioration. Critical care was time spent personally by me on the following activities: development of treatment plan with patient and/or surrogate as well as nursing, discussions with consultants, evaluation of patient's response to treatment, examination of patient, obtaining history from patient or surrogate, ordering and performing treatments and interventions, ordering and review of laboratory studies, ordering and review of radiographic studies, pulse oximetry and re-evaluation of patient's condition.   Final Clinical Impressions(s) / ED Diagnoses   Final diagnoses:  Anemia, unspecified type    ED Discharge Orders         Ordered    folic acid (FOLVITE) 1 MG tablet  Daily     07/16/18 1220    Multiple Vitamin (MULTIVITAMIN WITH MINERALS) TABS tablet  Daily     07/16/18 1220    thiamine 100 MG tablet  Daily     07/16/18 1220    clopidogrel (PLAVIX) 75 MG tablet  Daily     07/16/18 1220    Diet - low sodium heart healthy     07/16/18 1220    Discharge instructions    Comments:  Please check your CBC on Monday.  Follow up with gastroenterology as recommended.   07/16/18 1220           Davonna Belling, MD 07/14/18 1658    Davonna Belling, MD 08/09/18 [phone]

## 2018-07-15 ENCOUNTER — Encounter (HOSPITAL_COMMUNITY): Payer: Self-pay | Admitting: Physician Assistant

## 2018-07-15 ENCOUNTER — Encounter (HOSPITAL_COMMUNITY)
Admission: EM | Disposition: A | Discharge status: Home / Self Care | Payer: Self-pay | Source: Ambulatory Visit | Attending: Internal Medicine

## 2018-07-15 DIAGNOSIS — F1910 Other psychoactive substance abuse, uncomplicated: Secondary | ICD-10-CM

## 2018-07-15 DIAGNOSIS — K31819 Angiodysplasia of stomach and duodenum without bleeding: Secondary | ICD-10-CM | POA: Diagnosis present

## 2018-07-15 DIAGNOSIS — D649 Anemia, unspecified: Secondary | ICD-10-CM | POA: Diagnosis present

## 2018-07-15 DIAGNOSIS — I2510 Atherosclerotic heart disease of native coronary artery without angina pectoris: Secondary | ICD-10-CM

## 2018-07-15 DIAGNOSIS — F17210 Nicotine dependence, cigarettes, uncomplicated: Secondary | ICD-10-CM | POA: Diagnosis present

## 2018-07-15 DIAGNOSIS — D696 Thrombocytopenia, unspecified: Secondary | ICD-10-CM | POA: Diagnosis present

## 2018-07-15 DIAGNOSIS — J449 Chronic obstructive pulmonary disease, unspecified: Secondary | ICD-10-CM | POA: Diagnosis present

## 2018-07-15 DIAGNOSIS — F1490 Cocaine use, unspecified, uncomplicated: Secondary | ICD-10-CM | POA: Diagnosis present

## 2018-07-15 DIAGNOSIS — Z8249 Family history of ischemic heart disease and other diseases of the circulatory system: Secondary | ICD-10-CM | POA: Diagnosis not present

## 2018-07-15 DIAGNOSIS — D122 Benign neoplasm of ascending colon: Secondary | ICD-10-CM | POA: Diagnosis present

## 2018-07-15 DIAGNOSIS — K921 Melena: Secondary | ICD-10-CM | POA: Diagnosis present

## 2018-07-15 DIAGNOSIS — E785 Hyperlipidemia, unspecified: Secondary | ICD-10-CM | POA: Diagnosis present

## 2018-07-15 DIAGNOSIS — D62 Acute posthemorrhagic anemia: Secondary | ICD-10-CM

## 2018-07-15 DIAGNOSIS — F1410 Cocaine abuse, uncomplicated: Secondary | ICD-10-CM | POA: Diagnosis present

## 2018-07-15 DIAGNOSIS — G7000 Myasthenia gravis without (acute) exacerbation: Secondary | ICD-10-CM | POA: Diagnosis present

## 2018-07-15 DIAGNOSIS — Z820 Family history of epilepsy and other diseases of the nervous system: Secondary | ICD-10-CM | POA: Diagnosis not present

## 2018-07-15 DIAGNOSIS — R195 Other fecal abnormalities: Secondary | ICD-10-CM | POA: Diagnosis not present

## 2018-07-15 DIAGNOSIS — I255 Ischemic cardiomyopathy: Secondary | ICD-10-CM | POA: Diagnosis present

## 2018-07-15 DIAGNOSIS — Z955 Presence of coronary angioplasty implant and graft: Secondary | ICD-10-CM | POA: Diagnosis not present

## 2018-07-15 DIAGNOSIS — Z7982 Long term (current) use of aspirin: Secondary | ICD-10-CM | POA: Diagnosis not present

## 2018-07-15 DIAGNOSIS — Z7902 Long term (current) use of antithrombotics/antiplatelets: Secondary | ICD-10-CM

## 2018-07-15 DIAGNOSIS — D509 Iron deficiency anemia, unspecified: Secondary | ICD-10-CM | POA: Diagnosis present

## 2018-07-15 DIAGNOSIS — Z79899 Other long term (current) drug therapy: Secondary | ICD-10-CM | POA: Diagnosis not present

## 2018-07-15 DIAGNOSIS — Z8674 Personal history of sudden cardiac arrest: Secondary | ICD-10-CM | POA: Diagnosis not present

## 2018-07-15 DIAGNOSIS — R000 Tachycardia, unspecified: Secondary | ICD-10-CM | POA: Diagnosis present

## 2018-07-15 DIAGNOSIS — I10 Essential (primary) hypertension: Secondary | ICD-10-CM | POA: Diagnosis present

## 2018-07-15 DIAGNOSIS — I252 Old myocardial infarction: Secondary | ICD-10-CM | POA: Diagnosis not present

## 2018-07-15 LAB — PREPARE RBC (CROSSMATCH)

## 2018-07-15 LAB — CBC
HEMATOCRIT: 20 % — AB (ref 39.0–52.0)
HEMOGLOBIN: 6.1 g/dL — AB (ref 13.0–17.0)
MCH: 28.1 pg (ref 26.0–34.0)
MCHC: 30.5 g/dL (ref 30.0–36.0)
MCV: 92.2 fL (ref 78.0–100.0)
Platelets: 330 10*3/uL (ref 150–400)
RBC: 2.17 MIL/uL — ABNORMAL LOW (ref 4.22–5.81)
RDW: 16.8 % — ABNORMAL HIGH (ref 11.5–15.5)
WBC: 4.2 10*3/uL (ref 4.0–10.5)

## 2018-07-15 LAB — HIV ANTIBODY (ROUTINE TESTING W REFLEX): HIV SCREEN 4TH GENERATION: NONREACTIVE

## 2018-07-15 SURGERY — ESOPHAGOGASTRODUODENOSCOPY (EGD) WITH PROPOFOL
Anesthesia: Monitor Anesthesia Care

## 2018-07-15 MED ORDER — SODIUM CHLORIDE 0.9% IV SOLUTION: Freq: Once | INTRAVENOUS | Status: DC

## 2018-07-15 MED ORDER — POLYETHYLENE GLYCOL 3350 17 GM/SCOOP PO POWD
1.0000 | Freq: Once | ORAL | Status: AC
  Administered 2018-07-15: 255 g via ORAL
  Filled 2018-07-15: qty 255

## 2018-07-15 MED ORDER — METOCLOPRAMIDE HCL 5 MG/ML IJ SOLN
10.0000 mg | Freq: Once | INTRAMUSCULAR | Status: AC
  Administered 2018-07-16: 10 mg via INTRAVENOUS
  Filled 2018-07-15: qty 2

## 2018-07-15 MED ORDER — METOCLOPRAMIDE HCL 5 MG/ML IJ SOLN
10.0000 mg | Freq: Once | INTRAMUSCULAR | Status: AC
  Administered 2018-07-15: 10 mg via INTRAVENOUS
  Filled 2018-07-15: qty 2

## 2018-07-15 MED ORDER — PANTOPRAZOLE SODIUM 40 MG IV SOLR
40.0000 mg | Freq: Two times a day (BID) | INTRAVENOUS | Status: DC
  Administered 2018-07-15 – 2018-07-16 (×2): 40 mg via INTRAVENOUS
  Filled 2018-07-15 (×2): qty 40

## 2018-07-15 MED ORDER — BISACODYL 5 MG PO TBEC
20.0000 mg | DELAYED_RELEASE_TABLET | Freq: Once | ORAL | Status: AC
  Administered 2018-07-15: 20 mg via ORAL
  Filled 2018-07-15: qty 4

## 2018-07-15 NOTE — H&P (View-Only) (Signed)
[facility] Consult: 8:41 AM 07/15/2018  LOS: 0 days    Referring Provider: Dr Karleen Hampshire  Primary Care Physician:  Jamesetta Geralds, MD Primary Gastroenterologist:  Dr. Fuller Plan as u/a pt last month.     Reason for Consultation:  Anemia, FOBT + in pt on Brilinta.     HPI: Howard Thompson is a 52 y.o. male.  Hx CAD.  ASPVD. S/p left femoral endarterectomy 2016.  HTN. myasthenia gravis, not on meds for this.  Polysubstance abuse: EtOH and cocaine. Admitted on 05/22/2018 -06/21/2018 after out of hospital V. fib arrest, STEMI. S/p drug-eluting stent to 100% occluded LAD.  Course complicated by cardiogenic shock and AKI, required CRRT transitioned to HD.  Shock liver (AST/ALT >10,000/8,869) with coagulopathy.  Liver normal on non-contrast CT.   06/10/18 EGD for anemia (7 U PRBCs, Hgb nadir 6.1), FOBT + (black stool): Patchy, friable duodenitis, small HH.  No ulcers, no biopsy.   Discharged with home PT, Rx for Protonix 80 mg daily, Brilinta, 81 ASA.  Has not required hemodialysis since discharge.  Latest echo with EF 40 to 45%, up from 25 to 30%.    Stable fatigue, intermittent lightheadedness, no chest pain at home.  Seen by renal MD yesterday, labs showed anemia so pt told to go to ED.  Hgb of 4.5 (8.3 on 8/23). Stools dark but not black or melenic, FOBT + in lab.   After 2 U PRBCs, Hgb 6.1.  At home taking 4 meds, including the ASA.  However discharge med list is for 9 meds.  Pretty certain he is taking the Protonix, Brilinta.  Not using NSAIDS or ASA powders.   Used cocaine 1 week ago, 2 beers since discharge, still smoking cigarettes.    Excellent appetite.  No adverse GI symptoms, nausea.  Stools formed once or twice a day.  Dark in color but not melenic.  No rectal bleeding. No previous colonoscopy.  Had never had  upper endoscopy prior to 06/10/2018  No family history of colorectal cancer, ulcers, anemia, GI bleeding.           Past Medical History:  Diagnosis Date  . Cardiac arrest ([place]) 05/22/2018  . Cardiomyopathy ([place])   . Coronary artery disease   . Duodenitis   . History of blood transfusion   . Hyperlipidemia   . Myasthenia gravis ([place])   . Myocardial infarction ([place]) 05/22/2018   STEMI  . Thrombocytopenia ([place]) 04/2018    Past Surgical History:  Procedure Laterality Date  . ATHERECTOMY    . CARDIAC CATHETERIZATION  05/22/2018   stent  . CORONARY ANGIOPLASTY    . CORONARY STENT INTERVENTION N/A 05/22/2018   Procedure: CORONARY STENT INTERVENTION;  Surgeon: Lorretta Harp, MD;  Location: [place] [facility];  Service: Cardiovascular;  Laterality: N/A;  . ESOPHAGOGASTRODUODENOSCOPY (EGD) WITH PROPOFOL N/A 06/10/2018   Procedure: ESOPHAGOGASTRODUODENOSCOPY (EGD) WITH PROPOFOL;  Surgeon: Ladene Artist, MD;  Location: [facility] ENDOSCOPY;  Service: Endoscopy;  Laterality: N/A;  . FEMORAL ARTERY - POPLITEAL ARTERY BYPASS GRAFT    . IR FLUORO GUIDE  CV LINE RIGHT  05/31/2018  . IR REMOVAL TUN CV CATH W/O FL  06/21/2018  . IR US GUIDE VASC ACCESS RIGHT  05/31/2018  . LEFT HEART CATH AND CORONARY ANGIOGRAPHY N/A 05/22/2018   Procedure: LEFT HEART CATH AND CORONARY ANGIOGRAPHY;  Surgeon: Lorretta Harp, MD;  Location: [place] [facility];  Service: Cardiovascular;  Laterality: N/A;    Prior to Admission medications   Medication Sig Start Date End Date Taking? Authorizing Provider  amLODipine (NORVASC) 10 MG tablet Take 1 tablet (10 mg total) by mouth daily. Patient taking differently: Take 5 mg by mouth daily.  06/22/18 07/22/18 Yes Amin, Jeanella Flattery, MD  aspirin 81 MG chewable tablet Chew 1 tablet (81 mg total) by mouth daily. 06/22/18 07/22/18 Yes Amin, Jeanella Flattery, MD  atorvastatin (LIPITOR) 40 MG tablet Take 1 tablet (40 mg total) by mouth daily at 6 PM. Patient taking differently: Take 40 mg  by mouth at bedtime.  06/21/18 07/21/18 Yes Amin, Jeanella Flattery, MD  furosemide (LASIX) 20 MG tablet Take 20 mg by mouth 2 (two) times daily. 06/29/18  Yes [provider]  multivitamin (RENA-VIT) TABS tablet Take 1 tablet by mouth at bedtime. 06/21/18 07/21/18 Yes Amin, Ankit Chirag, MD  nebivolol (BYSTOLIC) 2.5 MG tablet Take 1 tablet (2.5 mg total) by mouth at bedtime. 06/21/18 07/21/18 Yes Amin, Jeanella Flattery, MD  nitroGLYCERIN (NITROSTAT) 0.4 MG SL tablet Place 0.4 mg under the tongue every 5 (five) minutes as needed for chest pain.   Yes [provider]  pantoprazole (PROTONIX) 40 MG tablet Take 1 tablet (40 mg total) by mouth 2 (two) times daily. 06/21/18 07/21/18 Yes Amin, Jeanella Flattery, MD  QUEtiapine (SEROQUEL) 50 MG tablet Take 1 tablet (50 mg total) by mouth 2 (two) times daily. Patient taking differently: Take 50 mg by mouth at bedtime.  06/21/18 07/21/18 Yes Amin, Jeanella Flattery, MD  ticagrelor (BRILINTA) 90 MG TABS tablet Take 1 tablet (90 mg total) by mouth 2 (two) times daily. 06/21/18 07/21/18 Yes Amin, Jeanella Flattery, MD    Scheduled Meds: . atorvastatin  40 mg Oral q1800  . folic acid  1 mg Oral Daily  . furosemide  20 mg Oral BID  . multivitamin with minerals  1 tablet Oral Daily  . nebivolol  2.5 mg Oral QHS  . pantoprazole  40 mg Oral BID  . QUEtiapine  50 mg Oral QHS  . thiamine  100 mg Oral Daily   Infusions:  PRN Meds: LORazepam **OR** LORazepam, nitroGLYCERIN   Allergies as of 07/14/2018  . (No Known Allergies)    History reviewed. No pertinent family history.    REVIEW OF SYSTEMS: Constitutional:  Per HPI ENT:  No nose bleeds Pulm:  No cough or resting dyspnea, not much DOE. CV:  No palpitations.  Was having issues with LE edema, but this improved and resolved within the last several days after Rx with Lasix per Cardiologist at [facility].  No chest pain. GU:  No hematuria, no frequency GI:  Per HPI Heme: No excessive or unusual bleeding or  bruising. Transfusions:  Per HPI Neuro:  No headaches, no peripheral tingling or numbness Derm:  No itching, no rash or sores.  Endocrine:  No sweats or chills.  No polyuria or dysuria Immunization:  Not queried Travel:  None beyond local counties in last few months.    PHYSICAL EXAM: Vital signs in last 24 hours: Vitals:   07/15/18 0540 07/15/18 0742  BP: 116/61 116/64  Pulse: 86  85  Resp: 20 18  Temp: 98.1 F (36.7 C) 98.1 F (36.7 C)  SpO2: 100% 100%   Wt Readings from Last 3 Encounters:  07/14/18 64.3 kg    General: Looks older than stated age but does not look ill.  Sitting comfortably in bed. Head: No facial asymmetry or swelling.  No signs of head trauma. Eyes: No conjunctival pallor, no scleral icterus.  EOMI. Ears: Not hard of hearing. Nose: No congestion or discharge. Mouth: Oral mucosa pink, clear, moist.  Tongue midline. Neck: No JVD, no masses, no thyromegaly. Lungs: Clear bilaterally.  No labored breathing.  No cough. Heart: RRR.  No MRG.  S1, S2 present.  NSR, rate in the 80s on telemetry. Abdomen: Soft.  Not tender or distended.  No HSM, masses, bruits, hernias.  Active bowel sounds..   Rectal: Deferred Musc/Skeltl: No joint swelling, redness Extremities: No CCE Neurologic: Alert.  Oriented x3.  Moves all 4 limbs, strength not tested.  No tremors or gross deficits. Skin: No rashes, sores, telangiectasia. Nodes: No cervical adenopathy Psych: Lessened, cooperative.  Intake/Output from previous day: 09/18 0701 - 09/19 0700 In: 1795 [I.V.:400; Blood:1395] Out: 0  Intake/Output this shift: No intake/output data recorded.  LAB RESULTS: Recent Labs    07/14/18 1451 07/15/18 0555  WBC 8.4 4.2  HGB 4.4* 6.1*  HCT 16.1* 20.0*  PLT 429* 330   BMET Lab Results  Component Value Date   NA 138 07/14/2018   NA 138 06/21/2018   NA 137 06/20/2018   K 4.1 07/14/2018   K 3.5 06/21/2018   K 3.6 06/20/2018   CL 106 07/14/2018   CL 109 06/21/2018   CL  106 06/20/2018   CO2 23 07/14/2018   CO2 23 06/21/2018   CO2 24 06/20/2018   GLUCOSE 105 (H) 07/14/2018   GLUCOSE 105 (H) 06/21/2018   GLUCOSE 91 06/20/2018   BUN 15 07/14/2018   BUN 54 (H) 06/21/2018   BUN 50 (H) 06/20/2018   CREATININE 1.40 (H) 07/14/2018   CREATININE 3.36 (H) 06/21/2018   CREATININE 4.58 (H) 06/20/2018   CALCIUM 8.9 07/14/2018   CALCIUM 7.8 (L) 06/21/2018   CALCIUM 7.8 (L) 06/20/2018    Drugs of Abuse     Component Value Date/Time   LABOPIA NONE DETECTED 05/23/2018 0045   COCAINSCRNUR POSITIVE (A) 05/23/2018 0045   LABBENZ POSITIVE (A) 05/23/2018 0045   AMPHETMU NONE DETECTED 05/23/2018 0045   THCU NONE DETECTED 05/23/2018 0045   LABBARB NONE DETECTED 05/23/2018 0045     RADIOLOGY STUDIES: No results found.   IMPRESSION:   *    Acute on chronic anemia.  Black, FOBT positive stools.   06/09/2018 EGD for black /FOBT positive stools and anemia revealed duodenitis.   Believed to be compliant with Protonix 80 mg daily (vs 40 mg daily as documented on OV with Cardiologist 9/13). Hgb improved post 2 U PRBCs, 2 more are ordered.   *   Chronic Brilinta, low-dose aspirin following V. fib arrest, ACS and cardiac cath/drug-eluting stent placed to 100% occluded LAD.  *    Polysubstance abuse.  Admits to drinking 2 beers since discharge and use of cocaine.  *    Shock liver during hospitalization last month, resolved.  Hepatitis B surface Ag, Hep B core Ab, HCV negative   PLAN:     *   Per Dr Carlean Purl.   Agree with 2 additional PRBCs Working to arrange EGD/Colon tomorrow this afternoon.  See orders re prep, diet.  Continue IV Protonix for now.       Azucena Freed  07/15/2018, 8:41 AM Phone [place] Attending   I have taken an interval history, reviewed the chart and examined the patient. I agree with the Advanced Practitioner's note, impression and recommendations.    Decreased Hgb/worsening anemia Heme + Presumed acute/subacute  blood loss anemia Dual antiPLT tx after STEMI and DES This is held but still circulating   Plan colonoscopy and possible EGD tomorrow to investigate and treat causes of bleeding if possible The risks and benefits as well as alternatives of endoscopic procedure(s) have been discussed and reviewed. All questions answered. The patient agrees to proceed. He is at higher risk of bleeding from procedures as Brilinta still in effect but I think appropriate to evaluate bleeding and anemia and not wait 5-7 days to wear off  Gatha Mayer, MD, [facility] 07/15/2018 3:35 PM

## 2018-07-15 NOTE — Consult Note (Addendum)
[facility] Consult: 8:41 AM 07/15/2018  LOS: 0 days    Referring Provider: Dr Karleen Hampshire  Primary Care Physician:  Jamesetta Geralds, MD Primary Gastroenterologist:  Dr. Fuller Plan as u/a pt last month.     Reason for Consultation:  Anemia, FOBT + in pt on Brilinta.     HPI: Howard Thompson is a 52 y.o. male.  Hx CAD.  ASPVD. S/p left femoral endarterectomy 2016.  HTN. myasthenia gravis, not on meds for this.  Polysubstance abuse: EtOH and cocaine. Admitted on 05/22/2018 -06/21/2018 after out of hospital V. fib arrest, STEMI. S/p drug-eluting stent to 100% occluded LAD.  Course complicated by cardiogenic shock and AKI, required CRRT transitioned to HD.  Shock liver (AST/ALT >10,000/8,869) with coagulopathy.  Liver normal on non-contrast CT.   06/10/18 EGD for anemia (7 U PRBCs, Hgb nadir 6.1), FOBT + (black stool): Patchy, friable duodenitis, small HH.  No ulcers, no biopsy.   Discharged with home PT, Rx for Protonix 80 mg daily, Brilinta, 81 ASA.  Has not required hemodialysis since discharge.  Latest echo with EF 40 to 45%, up from 25 to 30%.    Stable fatigue, intermittent lightheadedness, no chest pain at home.  Seen by renal MD yesterday, labs showed anemia so pt told to go to ED.  Hgb of 4.5 (8.3 on 8/23). Stools dark but not black or melenic, FOBT + in lab.   After 2 U PRBCs, Hgb 6.1.  At home taking 4 meds, including the ASA.  However discharge med list is for 9 meds.  Pretty certain he is taking the Protonix, Brilinta.  Not using NSAIDS or ASA powders.   Used cocaine 1 week ago, 2 beers since discharge, still smoking cigarettes.    Excellent appetite.  No adverse GI symptoms, nausea.  Stools formed once or twice a day.  Dark in color but not melenic.  No rectal bleeding. No previous colonoscopy.  Had never had  upper endoscopy prior to 06/10/2018  No family history of colorectal cancer, ulcers, anemia, GI bleeding.           Past Medical History:  Diagnosis Date  . Cardiac arrest ([place]) 05/22/2018  . Cardiomyopathy ([place])   . Coronary artery disease   . Duodenitis   . History of blood transfusion   . Hyperlipidemia   . Myasthenia gravis ([place])   . Myocardial infarction ([place]) 05/22/2018   STEMI  . Thrombocytopenia ([place]) 04/2018    Past Surgical History:  Procedure Laterality Date  . ATHERECTOMY    . CARDIAC CATHETERIZATION  05/22/2018   stent  . CORONARY ANGIOPLASTY    . CORONARY STENT INTERVENTION N/A 05/22/2018   Procedure: CORONARY STENT INTERVENTION;  Surgeon: Lorretta Harp, MD;  Location: [place] [facility];  Service: Cardiovascular;  Laterality: N/A;  . ESOPHAGOGASTRODUODENOSCOPY (EGD) WITH PROPOFOL N/A 06/10/2018   Procedure: ESOPHAGOGASTRODUODENOSCOPY (EGD) WITH PROPOFOL;  Surgeon: Ladene Artist, MD;  Location: [facility] ENDOSCOPY;  Service: Endoscopy;  Laterality: N/A;  . FEMORAL ARTERY - POPLITEAL ARTERY BYPASS GRAFT    . IR FLUORO GUIDE  CV LINE RIGHT  05/31/2018  . IR REMOVAL TUN CV CATH W/O FL  06/21/2018  . IR US GUIDE VASC ACCESS RIGHT  05/31/2018  . LEFT HEART CATH AND CORONARY ANGIOGRAPHY N/A 05/22/2018   Procedure: LEFT HEART CATH AND CORONARY ANGIOGRAPHY;  Surgeon: Lorretta Harp, MD;  Location: [place] [facility];  Service: Cardiovascular;  Laterality: N/A;    Prior to Admission medications   Medication Sig Start Date End Date Taking? Authorizing Provider  amLODipine (NORVASC) 10 MG tablet Take 1 tablet (10 mg total) by mouth daily. Patient taking differently: Take 5 mg by mouth daily.  06/22/18 07/22/18 Yes Amin, Jeanella Flattery, MD  aspirin 81 MG chewable tablet Chew 1 tablet (81 mg total) by mouth daily. 06/22/18 07/22/18 Yes Amin, Jeanella Flattery, MD  atorvastatin (LIPITOR) 40 MG tablet Take 1 tablet (40 mg total) by mouth daily at 6 PM. Patient taking differently: Take 40 mg  by mouth at bedtime.  06/21/18 07/21/18 Yes Amin, Jeanella Flattery, MD  furosemide (LASIX) 20 MG tablet Take 20 mg by mouth 2 (two) times daily. 06/29/18  Yes [provider]  multivitamin (RENA-VIT) TABS tablet Take 1 tablet by mouth at bedtime. 06/21/18 07/21/18 Yes Amin, Ankit Chirag, MD  nebivolol (BYSTOLIC) 2.5 MG tablet Take 1 tablet (2.5 mg total) by mouth at bedtime. 06/21/18 07/21/18 Yes Amin, Jeanella Flattery, MD  nitroGLYCERIN (NITROSTAT) 0.4 MG SL tablet Place 0.4 mg under the tongue every 5 (five) minutes as needed for chest pain.   Yes [provider]  pantoprazole (PROTONIX) 40 MG tablet Take 1 tablet (40 mg total) by mouth 2 (two) times daily. 06/21/18 07/21/18 Yes Amin, Jeanella Flattery, MD  QUEtiapine (SEROQUEL) 50 MG tablet Take 1 tablet (50 mg total) by mouth 2 (two) times daily. Patient taking differently: Take 50 mg by mouth at bedtime.  06/21/18 07/21/18 Yes Amin, Jeanella Flattery, MD  ticagrelor (BRILINTA) 90 MG TABS tablet Take 1 tablet (90 mg total) by mouth 2 (two) times daily. 06/21/18 07/21/18 Yes Amin, Jeanella Flattery, MD    Scheduled Meds: . atorvastatin  40 mg Oral q1800  . folic acid  1 mg Oral Daily  . furosemide  20 mg Oral BID  . multivitamin with minerals  1 tablet Oral Daily  . nebivolol  2.5 mg Oral QHS  . pantoprazole  40 mg Oral BID  . QUEtiapine  50 mg Oral QHS  . thiamine  100 mg Oral Daily   Infusions:  PRN Meds: LORazepam **OR** LORazepam, nitroGLYCERIN   Allergies as of 07/14/2018  . (No Known Allergies)    History reviewed. No pertinent family history.    REVIEW OF SYSTEMS: Constitutional:  Per HPI ENT:  No nose bleeds Pulm:  No cough or resting dyspnea, not much DOE. CV:  No palpitations.  Was having issues with LE edema, but this improved and resolved within the last several days after Rx with Lasix per Cardiologist at [facility].  No chest pain. GU:  No hematuria, no frequency GI:  Per HPI Heme: No excessive or unusual bleeding or  bruising. Transfusions:  Per HPI Neuro:  No headaches, no peripheral tingling or numbness Derm:  No itching, no rash or sores.  Endocrine:  No sweats or chills.  No polyuria or dysuria Immunization:  Not queried Travel:  None beyond local counties in last few months.    PHYSICAL EXAM: Vital signs in last 24 hours: Vitals:   07/15/18 0540 07/15/18 0742  BP: 116/61 116/64  Pulse: 86  85  Resp: 20 18  Temp: 98.1 F (36.7 C) 98.1 F (36.7 C)  SpO2: 100% 100%   Wt Readings from Last 3 Encounters:  07/14/18 64.3 kg    General: Looks older than stated age but does not look ill.  Sitting comfortably in bed. Head: No facial asymmetry or swelling.  No signs of head trauma. Eyes: No conjunctival pallor, no scleral icterus.  EOMI. Ears: Not hard of hearing. Nose: No congestion or discharge. Mouth: Oral mucosa pink, clear, moist.  Tongue midline. Neck: No JVD, no masses, no thyromegaly. Lungs: Clear bilaterally.  No labored breathing.  No cough. Heart: RRR.  No MRG.  S1, S2 present.  NSR, rate in the 80s on telemetry. Abdomen: Soft.  Not tender or distended.  No HSM, masses, bruits, hernias.  Active bowel sounds..   Rectal: Deferred Musc/Skeltl: No joint swelling, redness Extremities: No CCE Neurologic: Alert.  Oriented x3.  Moves all 4 limbs, strength not tested.  No tremors or gross deficits. Skin: No rashes, sores, telangiectasia. Nodes: No cervical adenopathy Psych: Lessened, cooperative.  Intake/Output from previous day: 09/18 0701 - 09/19 0700 In: 1795 [I.V.:400; Blood:1395] Out: 0  Intake/Output this shift: No intake/output data recorded.  LAB RESULTS: Recent Labs    07/14/18 1451 07/15/18 0555  WBC 8.4 4.2  HGB 4.4* 6.1*  HCT 16.1* 20.0*  PLT 429* 330   BMET Lab Results  Component Value Date   NA 138 07/14/2018   NA 138 06/21/2018   NA 137 06/20/2018   K 4.1 07/14/2018   K 3.5 06/21/2018   K 3.6 06/20/2018   CL 106 07/14/2018   CL 109 06/21/2018   CL  106 06/20/2018   CO2 23 07/14/2018   CO2 23 06/21/2018   CO2 24 06/20/2018   GLUCOSE 105 (H) 07/14/2018   GLUCOSE 105 (H) 06/21/2018   GLUCOSE 91 06/20/2018   BUN 15 07/14/2018   BUN 54 (H) 06/21/2018   BUN 50 (H) 06/20/2018   CREATININE 1.40 (H) 07/14/2018   CREATININE 3.36 (H) 06/21/2018   CREATININE 4.58 (H) 06/20/2018   CALCIUM 8.9 07/14/2018   CALCIUM 7.8 (L) 06/21/2018   CALCIUM 7.8 (L) 06/20/2018    Drugs of Abuse     Component Value Date/Time   LABOPIA NONE DETECTED 05/23/2018 0045   COCAINSCRNUR POSITIVE (A) 05/23/2018 0045   LABBENZ POSITIVE (A) 05/23/2018 0045   AMPHETMU NONE DETECTED 05/23/2018 0045   THCU NONE DETECTED 05/23/2018 0045   LABBARB NONE DETECTED 05/23/2018 0045     RADIOLOGY STUDIES: No results found.   IMPRESSION:   *    Acute on chronic anemia.  Black, FOBT positive stools.   06/09/2018 EGD for black /FOBT positive stools and anemia revealed duodenitis.   Believed to be compliant with Protonix 80 mg daily (vs 40 mg daily as documented on OV with Cardiologist 9/13). Hgb improved post 2 U PRBCs, 2 more are ordered.   *   Chronic Brilinta, low-dose aspirin following V. fib arrest, ACS and cardiac cath/drug-eluting stent placed to 100% occluded LAD.  *    Polysubstance abuse.  Admits to drinking 2 beers since discharge and use of cocaine.  *    Shock liver during hospitalization last month, resolved.  Hepatitis B surface Ag, Hep B core Ab, HCV negative   PLAN:     *   Per Dr Carlean Purl.   Agree with 2 additional PRBCs Working to arrange EGD/Colon tomorrow this afternoon.  See orders re prep, diet.  Continue IV Protonix for now.       Azucena Freed  07/15/2018, 8:41 AM Phone [place] Attending   I have taken an interval history, reviewed the chart and examined the patient. I agree with the Advanced Practitioner's note, impression and recommendations.    Decreased Hgb/worsening anemia Heme + Presumed acute/subacute  blood loss anemia Dual antiPLT tx after STEMI and DES This is held but still circulating   Plan colonoscopy and possible EGD tomorrow to investigate and treat causes of bleeding if possible The risks and benefits as well as alternatives of endoscopic procedure(s) have been discussed and reviewed. All questions answered. The patient agrees to proceed. He is at higher risk of bleeding from procedures as Brilinta still in effect but I think appropriate to evaluate bleeding and anemia and not wait 5-7 days to wear off  Gatha Mayer, MD, [facility] 07/15/2018 3:35 PM

## 2018-07-15 NOTE — Consult Note (Addendum)
Cardiology Consultation:   Patient ID: Howard Thompson; [id number]; [birth year]   Admit date: 07/14/2018 Date of Consult: 07/15/2018  Primary Care Provider: Jamesetta Geralds, MD Primary Cardiologist: Valaria Good, [place] Chief Complaint: lightheadedness, weakness  Patient Profile:   Howard Thompson is a 52 y.o. male with a hx of ongoing cocaine abuse, history of alcohol/tobacco abuse, myasthenia gravis, suspected COPD, PAD s/p L femoral endarterectomy 2016, recent complex prolonged admission for cardiac arrest (ventricular fibrillation)/STEMI, cardiogenic shock, GIB, ARF requiring HD, shock liver, anemia, thrombocytopenia, duodenitis who is being seen today for the evaluation of recent antiplatelet therapy at the request of Dr. Karleen Hampshire.  History of Present Illness:   He has a remote hx of POBA in [place]. More recently he was admitted 05/22/18 with out-of-hospital cardiac arrest, found down at the mailbox with presenting rhythm as v-fib. He underwent urgent cath with 100% prox LAD treated with DES, LVEF <25%. EF improved to 40-45% by 06/09/18 but hospital admission was extremely complicated by acute encephalopathy, ABL anemia requiring multiple transfusions, acute renal failure requiring initiation of dialysis, shock liver, thrombocytopenia. He was not felt to require HD at DC. He followed up with cardiologist Dr. Valaria Good 07/09/18 @ [facility] whose note indicates he'd developed edema of both legs and was started on Lasix with improvement. At that visit, BP was noted to be low so amlodipine was decreased.   He was seen in the neurology office yesterday for routine follow-up and hemoglobin was found to be markedly low so he was sent to the ER. Retrospectively the patient reported good days and bad days, with generalized weakness and fatigue. He denies any recent CP, SOB, palpitations. He does feel like one night last week he almost passed out in the bathroom but is not sure if he did or not. Upon  arrival to [facility] he was found to have Hgb of 4.4 (DC Hgb was 8.3). Cr has improved to 1.4. He is hemoccult positive. He reports stools have been generally darker than prior to MI but without acute change from recent stay. He reports feeling frustrated by being asked this question by 6 different doctors. Cardiology asked to see given ongoing DAPT with recent PCI. He is being transfused by primary team, and GI plans EGD/colonoscopy tomorrow.   He did use cocaine 1 week ago.  Past Medical History:  Diagnosis Date  . Acute renal failure ([place])    a. a. during 7-05/2018 adm for cardiac arrest, requiring HD temporarily.  . Alcohol abuse   . Anemia   . Cardiac arrest ([place]) 05/22/2018  . Cocaine abuse ([place])   . COPD (chronic obstructive pulmonary disease) ([place])   . Coronary artery disease   . Duodenitis   . GI bleed   . History of blood transfusion   . Hyperlipidemia   . Ischemic cardiomyopathy    a. EF <25% at time of cath/arrest 04/2018, improved to 40-45% by f/u echo 06/09/18.  . Myasthenia gravis ([place])   . Myocardial infarction ([place]) 05/22/2018   STEMI  . Shock liver    a. during 7-05/2018 adm for cardiac arrest.  . Thrombocytopenia ([place]) 04/2018  . Tobacco abuse   . Ventricular fibrillation [facility])     Past Surgical History:  Procedure Laterality Date  . ATHERECTOMY    . CARDIAC CATHETERIZATION  05/22/2018   stent  . CORONARY ANGIOPLASTY    . CORONARY STENT INTERVENTION N/A 05/22/2018   Procedure: CORONARY STENT INTERVENTION;  Surgeon: Lorretta Harp, MD;  Location:  [place] INVASIVE CV LAB;  Service: Cardiovascular;  Laterality: N/A;  . ESOPHAGOGASTRODUODENOSCOPY (EGD) WITH PROPOFOL N/A 06/10/2018   Procedure: ESOPHAGOGASTRODUODENOSCOPY (EGD) WITH PROPOFOL;  Surgeon: Ladene Artist, MD;  Location: [facility] ENDOSCOPY;  Service: Endoscopy;  Laterality: N/A;  . FEMORAL ARTERY - POPLITEAL ARTERY BYPASS GRAFT    . IR FLUORO GUIDE CV LINE RIGHT  05/31/2018  . IR REMOVAL TUN CV CATH W/O FL  06/21/2018    . IR US GUIDE VASC ACCESS RIGHT  05/31/2018  . LEFT HEART CATH AND CORONARY ANGIOGRAPHY N/A 05/22/2018   Procedure: LEFT HEART CATH AND CORONARY ANGIOGRAPHY;  Surgeon: Lorretta Harp, MD;  Location: [place] CV LAB;  Service: Cardiovascular;  Laterality: N/A;     Inpatient Medications: Scheduled Meds: . sodium chloride   Intravenous Once  . atorvastatin  40 mg Oral q1800  . bisacodyl  20 mg Oral Once  . folic acid  1 mg Oral Daily  . furosemide  20 mg Oral BID  . multivitamin with minerals  1 tablet Oral Daily  . nebivolol  2.5 mg Oral QHS  . pantoprazole  40 mg Oral BID  . QUEtiapine  50 mg Oral QHS  . thiamine  100 mg Oral Daily   Continuous Infusions:  PRN Meds: LORazepam **OR** LORazepam, nitroGLYCERIN  Home Meds: Prior to Admission medications   Medication Sig Start Date End Date Taking? Authorizing Provider  amLODipine (NORVASC) 10 MG tablet Take 1 tablet (10 mg total) by mouth daily. Patient taking differently: Take 5 mg by mouth daily.  06/22/18 07/22/18 Yes Amin, Jeanella Flattery, MD  aspirin 81 MG chewable tablet Chew 1 tablet (81 mg total) by mouth daily. 06/22/18 07/22/18 Yes Amin, Jeanella Flattery, MD  atorvastatin (LIPITOR) 40 MG tablet Take 1 tablet (40 mg total) by mouth daily at 6 PM. Patient taking differently: Take 40 mg by mouth at bedtime.  06/21/18 07/21/18 Yes Amin, Jeanella Flattery, MD  furosemide (LASIX) 20 MG tablet Take 20 mg by mouth 2 (two) times daily. 06/29/18  Yes [provider]  multivitamin (RENA-VIT) TABS tablet Take 1 tablet by mouth at bedtime. 06/21/18 07/21/18 Yes Amin, Ankit Chirag, MD  nebivolol (BYSTOLIC) 2.5 MG tablet Take 1 tablet (2.5 mg total) by mouth at bedtime. 06/21/18 07/21/18 Yes Amin, Jeanella Flattery, MD  nitroGLYCERIN (NITROSTAT) 0.4 MG SL tablet Place 0.4 mg under the tongue every 5 (five) minutes as needed for chest pain.   Yes [provider]  pantoprazole (PROTONIX) 40 MG tablet Take 1 tablet (40 mg total) by mouth 2 (two)  times daily. 06/21/18 07/21/18 Yes Amin, Jeanella Flattery, MD  QUEtiapine (SEROQUEL) 50 MG tablet Take 1 tablet (50 mg total) by mouth 2 (two) times daily. Patient taking differently: Take 50 mg by mouth at bedtime.  06/21/18 07/21/18 Yes Amin, Jeanella Flattery, MD  ticagrelor (BRILINTA) 90 MG TABS tablet Take 1 tablet (90 mg total) by mouth 2 (two) times daily. 06/21/18 07/21/18 Yes Damita Lack, MD    Allergies:   No Known Allergies  Social History:   Social History   Socioeconomic History  . Marital status: Divorced    Spouse name: Not on file  . Number of children: Not on file  . Years of education: Not on file  . Highest education level: Not on file  Occupational History  . Not on file  Social Needs  . Financial resource strain: Not on file  . Food insecurity:    Worry: Not on file  Inability: Not on file  . Transportation needs:    Medical: Not on file    Non-medical: Not on file  Tobacco Use  . Smoking status: Current Every Day Smoker    Packs/day: 1.00    Types: Cigarettes  . Smokeless tobacco: Current User  Substance and Sexual Activity  . Alcohol use: Not on file  . Drug use: Never  . Sexual activity: Not on file  Lifestyle  . Physical activity:    Days per week: Not on file    Minutes per session: Not on file  . Stress: Not on file  Relationships  . Social connections:    Talks on phone: Not on file    Gets together: Not on file    Attends religious service: Not on file    Active member of club or organization: Not on file    Attends meetings of clubs or organizations: Not on file    Relationship status: Not on file  . Intimate partner violence:    Fear of current or ex partner: Not on file    Emotionally abused: Not on file    Physically abused: Not on file    Forced sexual activity: Not on file  Other Topics Concern  . Not on file  Social History Narrative  . Not on file    Family History:   The patient's family history includes Alzheimer's disease  in his father.   ROS:  Please see the history of present illness.  All other ROS reviewed and negative.     Physical Exam/Data:   Vitals:   07/15/18 0540 07/15/18 0742 07/15/18 1053 07/15/18 1054  BP: 116/61 116/64 114/68 114/68  Pulse: 86 85 82 79  Resp: 20 18 18    Temp: 98.1 F (36.7 C) 98.1 F (36.7 C) 98.4 F (36.9 C) 98.4 F (36.9 C)  TempSrc: Oral Oral Oral Oral  SpO2: 100% 100% 100% 100%  Weight:      Height:        Intake/Output Summary (Last 24 hours) at 07/15/2018 1116 Last data filed at 07/15/2018 0519 Gross per 24 hour  Intake 1795 ml  Output 0 ml  Net 1795 ml   Filed Weights   07/14/18 1433 07/14/18 2002  Weight: 64.4 kg 64.3 kg   Body mass index is 20.93 kg/m.  General: Well developed, well nourished pale WM, in no acute distress. Head: Normocephalic, atraumatic, sclera non-icteric, no xanthomas, nares are without discharge.  Neck: Negative for carotid bruits. JVD not elevated. Lungs: Clear bilaterally to auscultation without wheezes, rales, or rhonchi. Breathing is unlabored. Heart: RRR with S1 S2. No murmurs, rubs, or gallops appreciated. Abdomen: Soft, non-tender, non-distended with normoactive bowel sounds. No hepatomegaly. No rebound/guarding. No obvious abdominal masses. Msk:  Strength and tone appear normal for age. Extremities: No clubbing or cyanosis. No edema.  Distal pedal pulses are 2+ and equal bilaterally. Neuro: Alert and oriented X 3. No facial asymmetry. No focal deficit. Moves all extremities spontaneously. [place] accent. Psych:  Responds to questions appropriately with a normal affect.  EKG:  No EKG on file. Have requested.  Relevant CV Studies: 2D echo 06/09/18 Study Conclusions  - Left ventricle: The cavity size was normal. Systolic function was   mildly to moderately reduced. The estimated ejection fraction was   in the range of 40% to 45%. Images were inadequate for LV wall   motion assessment. The study is not technically  sufficient to   allow evaluation of LV diastolic function. -  Aortic valve: There was mild regurgitation. - Mitral valve: Calcified annulus. There was mild regurgitation. - Atrial septum: There was increased thickness of the septum,   consistent with lipomatous hypertrophy. - Pulmonary arteries: Systolic pressure could not be accurately   estimated.  Recommendations:  Suggest definity contrast study to evaluate wall motion. Complications:  Apical images were extremely painful for the patient due to his broken ribs.  Laboratory Data:  Chemistry Recent Labs  Lab 07/14/18 1451  NA 138  K 4.1  CL 106  CO2 23  GLUCOSE 105*  BUN 15  CREATININE 1.40*  CALCIUM 8.9  GFRNONAA 57*  GFRAA >60  ANIONGAP 9    No results for input(s): PROT, ALBUMIN, AST, ALT, ALKPHOS, BILITOT in the last 168 hours. Hematology Recent Labs  Lab 07/14/18 1451 07/14/18 1856 07/15/18 0555  WBC 8.4  --  4.2  RBC 1.66* 1.51* 2.17*  HGB 4.4*  --  6.1*  HCT 16.1*  --  20.0*  MCV 97.0  --  92.2  MCH 26.5  --  28.1  MCHC 27.3*  --  30.5  RDW 19.3*  --  16.8*  PLT 429*  --  330   Cardiac EnzymesNo results for input(s): TROPONINI in the last 168 hours.  Recent Labs  Lab 07/14/18 1459  TROPIPOC 0.00    BNPNo results for input(s): BNP, PROBNP in the last 168 hours.  DDimer No results for input(s): DDIMER in the last 168 hours.  Radiology/Studies:  No results found.  Assessment and Plan:   1. Recurrent acute blood loss anemia with suspected GIB - being managed by GI/IM.  2. Recent cardiac arrest (VF) with CAD s/p DES to LAD - very difficult situation with fresh stent but life-threatening anemia. May need to consider switching Brilinta to Plavix. Pending review with Dr. Stanford Breed.   3. Ischemic cardiomyopathy - appears compensated. RAAS inhibition has been avoided given recent AKI. With recurrent cocaine abuse, may need to rethink beta blocker.  4. ? Syncope last week - patient not totally clear.  Continue monitoring with telemetry. I do not see that he was specifically advised on driving last admission. Will review with MD.  5. Recent ARF - appears to be improving.  For questions or updates, please contact [place] Please consult [email or web address] for contact info under Cardiology/STEMI.    Signed, Charlie Pitter, PA-C  07/15/2018 11:16 AM As above, patient seen and examined.  Briefly he is a 52 year old male with past medical history of substance abuse, myasthenia gravis, peripheral artery disease, recent admission following ventricular fibrillation arrest with PCI of his ZOX-0/96- (course complicated by cardiogenic shock, GI bleed, acute renal failure requiring hemodialysis, anemia and thrombocytopenia) now admitted with what is felt to be GI blood loss/anemia for further evaluation.  Patient states he has had some weakness and dizziness with standing but unchanged since discharge.  He was seen by nephrology yesterday and follow-up and laboratories were checked.  Hemoglobin was noted to be 4.4.  He was sent to the emergency room for further evaluation.  There is no dyspnea or chest pain.  Last echocardiogram August 2019 showed ejection fraction 40 to 45%.  Electrocardiogram shows sinus rhythm with left ventricular hypertrophy.  Initial hemoglobin 4.4 with MCV 97.  Ferritin 39.  1 acute blood loss anemia-findings suggestive of iron deficiency anemia with initial hemoglobin 4.4.  Agree with transfusion.  He is scheduled for EGD and colonoscopy tomorrow.  Continue Protonix.  Gastroenterology following.  2 coronary artery disease-status post  recent PCI of LAD with drug-eluting stent July 27.  Given probable active bleeding we will hold aspirin and Brilinta.  Once cleared from a GI standpoint will resume Plavix 75 mg daily long-term and avoid aspirin and Brilinta.  Continue statin.  3 ischemic cardiomyopathy-continue beta-blocker at present dose.  4 tobacco abuse-patient counseled on  discontinuing.  5 cocaine abuse-patient counseled on discontinuing.  Kirk Ruths, MD

## 2018-07-15 NOTE — Progress Notes (Signed)
PROGRESS NOTE    Howard Thompson  AJO:[id number] DOB: 1966/06/19 DOA: 07/14/2018 PCP: Jamesetta Geralds, MD    Brief Narrative: Howard Thompson is a 52 y.o. male with medical history significant for recent cardiac arrest, Polysubstance abuse- Cocaine, Tobacco and alcohol. Patient presented to his nephrologist office today for routine follow up, hgb was found to be low at 4.5, patient was subsequently sent to the ED.   Recent long hospitalization 05/22/18- 06/21/18, from Ventricular fibrillation, with emergent cath showing 100% LAD occlusion requiring stent, stay was medicated by of acute renal failure requiring HD, acute anemia with melena requiring multiple blood transfusions, possible GI bleed. EGD- 06/10/18 showed duodenitis, no obvious source of bleed,.   Today pt denies any complaints. But still feels weak.    Assessment & Plan:   Principal Problem:   Symptomatic anemia Active Problems:   ACS (acute coronary syndrome) (HCC)   Ventricular fibrillation (HCC)   Cardiac arrest (HCC)   Substance abuse (HCC)   Ischemic cardiomyopathy   Symptomatic anemia from blood loss:  - his H&H was 4.5 on admission, he underwent 2 units of prbc transfusions and his repeat H&h is 6.1. Another 2 units of prbc transfusion ordered.  Gi consulted. Last Endoscopy showed duodenitis. change oral PPI to IV PPI today.  He remains NPO.     Recent history of cardiac arrest with v fib, s/p cath DES stent to LAD.  Pt was discharged on Brillinta and aspirin, which were held yesterday . Will request cardiology recommendations regarding the anti platelet agents.    H/O SUBSTANCE abuse with cocaine and alcohol: Last cocaine use was one week ago.  Continues to take alcohol.  Ordered CIWA , watch for withdrawal symptoms.  Social work consulted for resources.    H/o Ischemic cardiomyopathy;  Recent cardiac arrest with v fib.  Last echocardiogram showed LVEF of 40 to 45% on 8/14.     Anemia of iron  deficiency/ anemia of blood loss: Iron is 11. Ferritin 39.  Iron supplementation on discharge.      DVT prophylaxis:scd's  Code Status: full code.  Family Communication: family at bedside.  Disposition Plan: pending further evaluation by GI.    Consultants:   Gastroenterology Velora Heckler.   Cardiology.   Procedures: None.  Antimicrobials: none.   Subjective: No chest pain or sob, no nausea, vomiting or abdominal pain,  Reports generalized weakness.   Objective: Vitals:   07/15/18 0124 07/15/18 0154 07/15/18 0540 07/15/18 0742  BP: (!) 110/56 (!) 103/56 116/61 116/64  Pulse: 78 89 86 85  Resp: 20 20 20 18   Temp: 98.6 F (37 C) 98.3 F (36.8 C) 98.1 F (36.7 C) 98.1 F (36.7 C)  TempSrc: Oral Oral Oral Oral  SpO2: 100% 100% 100% 100%  Weight:      Height:        Intake/Output Summary (Last 24 hours) at 07/15/2018 0936 Last data filed at 07/15/2018 0519 Gross per 24 hour  Intake 1795 ml  Output 0 ml  Net 1795 ml   Filed Weights   07/14/18 1433 07/14/18 2002  Weight: 64.4 kg 64.3 kg    Examination:  General exam: Appears calm and comfortable  Respiratory system: Clear to auscultation. Respiratory effort normal. Cardiovascular system: S1 & S2 heard, RRR. No JVD,murmer present.  Gastrointestinal system: Abdomen is nondistended, soft and nontender. No organomegaly or masses felt. Normal bowel sounds heard. Central nervous system: Alert and oriented. No focal neurological deficits. Extremities: Symmetric 5 x 5 power. Skin: No  rashes, lesions or ulcers Psychiatry:  Mood & affect appropriate.     Data Reviewed: I have personally reviewed following labs and imaging studies  CBC: Recent Labs  Lab 07/14/18 1451 07/15/18 0555  WBC 8.4 4.2  HGB 4.4* 6.1*  HCT 16.1* 20.0*  MCV 97.0 92.2  PLT 429* 725   Basic Metabolic Panel: Recent Labs  Lab 07/14/18 1451  NA 138  K 4.1  CL 106  CO2 23  GLUCOSE 105*  BUN 15  CREATININE 1.40*  CALCIUM 8.9    GFR: Estimated Creatinine Clearance: 56.8 mL/min (A) (by C-G formula based on SCr of 1.4 mg/dL (H)). Liver Function Tests: No results for input(s): AST, ALT, ALKPHOS, BILITOT, PROT, ALBUMIN in the last 168 hours. No results for input(s): LIPASE, AMYLASE in the last 168 hours. No results for input(s): AMMONIA in the last 168 hours. Coagulation Profile: No results for input(s): INR, PROTIME in the last 168 hours. Cardiac Enzymes: No results for input(s): CKTOTAL, CKMB, CKMBINDEX, TROPONINI in the last 168 hours. BNP (last 3 results) No results for input(s): PROBNP in the last 8760 hours. HbA1C: No results for input(s): HGBA1C in the last 72 hours. CBG: No results for input(s): GLUCAP in the last 168 hours. Lipid Profile: No results for input(s): CHOL, HDL, LDLCALC, TRIG, CHOLHDL, LDLDIRECT in the last 72 hours. Thyroid Function Tests: No results for input(s): TSH, T4TOTAL, FREET4, T3FREE, THYROIDAB in the last 72 hours. Anemia Panel: Recent Labs    07/14/18 1856  VITAMINB12 518  FOLATE 38.0  FERRITIN 39  TIBC 515*  IRON 11*  RETICCTPCT 7.1*   Sepsis Labs: No results for input(s): PROCALCITON, LATICACIDVEN in the last 168 hours.  No results found for this or any previous visit (from the past 240 hour(s)).       Radiology Studies: No results found.      Scheduled Meds: . sodium chloride   Intravenous Once  . atorvastatin  40 mg Oral q1800  . folic acid  1 mg Oral Daily  . furosemide  20 mg Oral BID  . multivitamin with minerals  1 tablet Oral Daily  . nebivolol  2.5 mg Oral QHS  . pantoprazole  40 mg Oral BID  . QUEtiapine  50 mg Oral QHS  . thiamine  100 mg Oral Daily   Continuous Infusions:   LOS: 0 days    Time spent: 34 min    Hosie Poisson, MD [facility] Pager [phone]  If 7PM-7AM, please contact night-coverage [email or web address] Password [facility] 07/15/2018, 9:36 AM

## 2018-07-16 ENCOUNTER — Encounter (HOSPITAL_COMMUNITY)
Admission: EM | Disposition: A | Discharge status: Home / Self Care | Payer: Self-pay | Source: Ambulatory Visit | Attending: Internal Medicine

## 2018-07-16 ENCOUNTER — Encounter (HOSPITAL_COMMUNITY): Payer: Self-pay | Admitting: *Deleted

## 2018-07-16 ENCOUNTER — Inpatient Hospital Stay (HOSPITAL_COMMUNITY): Payer: No Typology Code available for payment source | Admitting: Anesthesiology

## 2018-07-16 DIAGNOSIS — R195 Other fecal abnormalities: Secondary | ICD-10-CM

## 2018-07-16 DIAGNOSIS — Z7902 Long term (current) use of antithrombotics/antiplatelets: Secondary | ICD-10-CM

## 2018-07-16 DIAGNOSIS — D62 Acute posthemorrhagic anemia: Secondary | ICD-10-CM

## 2018-07-16 HISTORY — PX: ESOPHAGOGASTRODUODENOSCOPY (EGD) WITH PROPOFOL: SHX5813

## 2018-07-16 HISTORY — PX: HOT HEMOSTASIS: SHX5433

## 2018-07-16 HISTORY — PX: BIOPSY: SHX5522

## 2018-07-16 HISTORY — PX: COLONOSCOPY WITH PROPOFOL: SHX5780

## 2018-07-16 LAB — TYPE AND SCREEN
ABO/RH(D): O POS
ANTIBODY SCREEN: NEGATIVE
UNIT DIVISION: 0
UNIT DIVISION: 0
Unit division: 0
Unit division: 0

## 2018-07-16 LAB — BPAM RBC
BLOOD PRODUCT EXPIRATION DATE: 201910112359
Blood Product Expiration Date: 201910112359
Blood Product Expiration Date: 201910112359
Blood Product Expiration Date: 201910142359
ISSUE DATE / TIME: 201909190132
ISSUE DATE / TIME: 201909191103
ISSUE DATE / TIME: 201909182103
ISSUE DATE / TIME: 201909191343
UNIT TYPE AND RH: 5100
Unit Type and Rh: 5100
Unit Type and Rh: 5100
Unit Type and Rh: 5100

## 2018-07-16 LAB — POCT I-STAT 4, (NA,K, GLUC, HGB,HCT)
Glucose, Bld: 87 mg/dL (ref 70–99)
HCT: 26 % — ABNORMAL LOW (ref 39.0–52.0)
HEMOGLOBIN: 8.8 g/dL — AB (ref 13.0–17.0)
Potassium: 3.8 mmol/L (ref 3.5–5.1)
SODIUM: 142 mmol/L (ref 135–145)

## 2018-07-16 SURGERY — COLONOSCOPY WITH PROPOFOL
Anesthesia: Monitor Anesthesia Care

## 2018-07-16 MED ORDER — KETAMINE HCL 10 MG/ML IJ SOLN
INTRAMUSCULAR | Status: DC | PRN
  Administered 2018-07-16: 20 mg via INTRAVENOUS
  Administered 2018-07-16: 10 mg via INTRAVENOUS

## 2018-07-16 MED ORDER — THIAMINE HCL 100 MG PO TABS: 100.0000 mg | ORAL_TABLET | Freq: Every day | ORAL | 0 refills | Status: DC

## 2018-07-16 MED ORDER — ZOLPIDEM TARTRATE 5 MG PO TABS: 5.0000 mg | ORAL_TABLET | Freq: Every evening | ORAL | Status: DC | PRN

## 2018-07-16 MED ORDER — ADULT MULTIVITAMIN W/MINERALS CH: 1.0000 | ORAL_TABLET | Freq: Every day | ORAL | Status: DC

## 2018-07-16 MED ORDER — PROPOFOL 10 MG/ML IV BOLUS
INTRAVENOUS | Status: DC | PRN
  Administered 2018-07-16: 10 mg via INTRAVENOUS
  Administered 2018-07-16: 20 mg via INTRAVENOUS
  Administered 2018-07-16: 10 mg via INTRAVENOUS
  Administered 2018-07-16 (×2): 20 mg via INTRAVENOUS
  Administered 2018-07-16: 10 mg via INTRAVENOUS

## 2018-07-16 MED ORDER — LACTATED RINGERS IV SOLN
INTRAVENOUS | Status: AC | PRN
  Administered 2018-07-16: 07:00:00 via INTRAVENOUS
  Administered 2018-07-16: 1000 mL via INTRAVENOUS

## 2018-07-16 MED ORDER — SODIUM CHLORIDE 0.9 % IV SOLN
510.0000 mg | Freq: Once | INTRAVENOUS | Status: AC
  Administered 2018-07-16: 510 mg via INTRAVENOUS
  Filled 2018-07-16: qty 17

## 2018-07-16 MED ORDER — CLOPIDOGREL BISULFATE 75 MG PO TABS: 75.0000 mg | ORAL_TABLET | Freq: Every day | ORAL | 1 refills | Status: AC

## 2018-07-16 MED ORDER — FOLIC ACID 1 MG PO TABS: 1.0000 mg | ORAL_TABLET | Freq: Every day | ORAL | 0 refills | Status: DC

## 2018-07-16 MED ORDER — PROPOFOL 500 MG/50ML IV EMUL
INTRAVENOUS | Status: DC | PRN
  Administered 2018-07-16: 75 ug/kg/min via INTRAVENOUS

## 2018-07-16 SURGICAL SUPPLY — 25 items

## 2018-07-16 NOTE — Discharge Summary (Signed)
Physician Discharge Summary  Howard Thompson TOI:[id number] DOB: 05/26/1966 DOA: 07/14/2018  PCP: Jamesetta Geralds, MD  Admit date: 07/14/2018 Discharge date: 07/16/2018  Admitted From: Home.  Disposition:  Home.   Recommendations for Outpatient Follow-up:  1. Follow up with PCP in 1-2 weeks 2. Please obtain BMP/CBC in one week Please follow up with cardiology as recommended.   Discharge Condition: stable.  CODE STATUS: full code.  Diet recommendation: Heart Healthy    Brief/Interim Summary:  [name] a 52 y.o.malewith medical history significantfor recent cardiac arrest, Polysubstance abuse- Cocaine, Tobacco and alcohol. Patient presented to his nephrologist office today for routine follow up, hgb was found to be low at 4.5, patient was subsequently sent to the ED.  Recent long hospitalization7/27/19- 06/21/18, from Ventricular fibrillation, with emergent cath showing 100% LAD occlusion requiring stent, stay wasmedicated byof acute renal failure requiring HD, acute anemia with melena requiring multiple blood transfusions, possible GI bleed. EGD- 06/10/18 showed duodenitis, no obvious source of bleed,.    He was admitted for evaluation of GI bleed.    Discharge Diagnoses:  Principal Problem:   Symptomatic anemia Active Problems:   ACS (acute coronary syndrome) (HCC)   Ventricular fibrillation (HCC)   Cardiac arrest (HCC)   Substance abuse (HCC)   Ischemic cardiomyopathy   Heme + stool   Acute blood loss anemia   Long term current use of antithrombotics/antiplatelets  Symptomatic anemia from blood loss:  - his H&H was 4.5 on admission,  He underwent a total of 4 units of prbc transfusion. Gi consulted. Last Endoscopy showed duodenitis. Upper Endoscopy showed  A single non-bleeding angiodysplastic lesion in the duodenum. Treated with argon plasma coagulation (APC). Clip (MR conditional) was placed. Colonoscopy done, showed flat polyp in the ascending colon.   Repeat H&H is 8 this am. Pt adamant about going home.    Recent history of cardiac arrest with v fib, s/p cath DES stent to LAD.  Pt was discharged on Brillinta and aspirin,  Which are on hold. Cardiology consulted and plan for plavix 75 mg daily on discharge.    H/O SUBSTANCE abuse with cocaine and alcohol: Last cocaine use was one week ago.  Continues to take alcohol.  Ordered CIWA , watch for withdrawal symptoms.  Counseled against use.    H/o Ischemic cardiomyopathy;  Recent cardiac arrest with v fib.  Last echocardiogram showed LVEF of 40 to 45% on 8/14.     Anemia of iron deficiency/ anemia of blood loss: Iron is 11. Ferritin 39.  Iron infusion ordered.     Discharge Instructions  Discharge Instructions    Diet - low sodium heart healthy   Complete by:  As directed    Discharge instructions   Complete by:  As directed    Please check your CBC on Monday.  Follow up with gastroenterology as recommended.     Allergies as of 07/16/2018   No Known Allergies     Medication List    STOP taking these medications   aspirin 81 MG chewable tablet   ticagrelor 90 MG Tabs tablet Commonly known as:  BRILINTA     TAKE these medications   amLODipine 10 MG tablet Commonly known as:  NORVASC Take 1 tablet (10 mg total) by mouth daily. What changed:  how much to take   atorvastatin 40 MG tablet Commonly known as:  LIPITOR Take 1 tablet (40 mg total) by mouth daily at 6 PM. What changed:  when to take this   clopidogrel  75 MG tablet Commonly known as:  PLAVIX Take 1 tablet (75 mg total) by mouth daily. Start taking on:  2/51/1884   folic acid 1 MG tablet Commonly known as:  FOLVITE Take 1 tablet (1 mg total) by mouth daily. Start taking on:  07/17/2018   furosemide 20 MG tablet Commonly known as:  LASIX Take 20 mg by mouth 2 (two) times daily.   multivitamin Tabs tablet Take 1 tablet by mouth at bedtime.   multivitamin with minerals Tabs  tablet Take 1 tablet by mouth daily. Start taking on:  07/17/2018   nebivolol 2.5 MG tablet Commonly known as:  BYSTOLIC Take 1 tablet (2.5 mg total) by mouth at bedtime.   nitroGLYCERIN 0.4 MG SL tablet Commonly known as:  NITROSTAT Place 0.4 mg under the tongue every 5 (five) minutes as needed for chest pain.   pantoprazole 40 MG tablet Commonly known as:  PROTONIX Take 1 tablet (40 mg total) by mouth 2 (two) times daily.   QUEtiapine 50 MG tablet Commonly known as:  SEROQUEL Take 1 tablet (50 mg total) by mouth 2 (two) times daily. What changed:  when to take this   thiamine 100 MG tablet Take 1 tablet (100 mg total) by mouth daily. Start taking on:  07/17/2018      Follow-up Information    Radiontchenko, Alexei, MD. Schedule an appointment as soon as possible for a visit in 1 week(s).   Specialty:  Family Medicine Why:  please check CBC on monday.  Contact information: [address] [place] [address] [phone]        Valaria Good, MD .   Specialty:  Cardiology Contact information: [place] [address] [phone]          No Known Allergies  Consultations:  Gastroenterology  Cardiology    Procedures/Studies: Ir Removal Tun Cv Cath W/o Fl  Result Date: 06/21/2018 INDICATION: Patient with acute renal failure status post tunneled dialysis catheter placed 05/31/2018. Patient with improvement in renal function and request now made for removal. EXAM: REMOVAL TUNNELED CENTRAL VENOUS CATHETER MEDICATIONS: None ANESTHESIA/SEDATION: None FLUOROSCOPY TIME:  None COMPLICATIONS: None immediate. PROCEDURE: Informed written consent was obtained from the patient after a thorough discussion of the procedural risks, benefits and alternatives. All questions were addressed. Maximal Sterile Barrier Technique was utilized including caps, mask, sterile gowns, sterile gloves, sterile drape, hand hygiene and skin antiseptic. A timeout was  performed prior to the initiation of the procedure. The patient's right chest and catheter was prepped and draped in a normal sterile fashion. Heparin was removed from both ports of catheter. Using manual traction the cuff of the catheter was exposed and the catheter was removed in it's entirety. Pressure was held till hemostasis was obtained. A sterile dressing was applied. The patient tolerated the procedure well with no immediate complications. IMPRESSION: Successful catheter removal as described above. Read by: Brynda Greathouse PA-C Electronically Signed   By: Corrie Mckusick D.O.   On: 06/21/2018 13:27       Subjective: No chest pain or sob. No nausea, vomiting.   Discharge Exam: Vitals:   07/16/18 0840 07/16/18 0850  BP: 139/74 125/64  Pulse: 72 73  Resp: 20 12  Temp:    SpO2: 100% 100%   Vitals:   07/16/18 0708 07/16/18 0835 07/16/18 0840 07/16/18 0850  BP: 136/70  139/74 125/64  Pulse:   72 73  Resp: 12  20 12   Temp: 98.8 F (37.1 C) 97.6 F (  36.4 C)    TempSrc: Oral Oral    SpO2: 100%  100% 100%  Weight: 64.6 kg     Height: 5\' 9"  (1.753 m)       General: Pt is alert, awake, not in acute distress Cardiovascular: RRR, S1/S2 +, no rubs, no gallops Respiratory: CTA bilaterally, no wheezing, no rhonchi Abdominal: Soft, NT, ND, bowel sounds + Extremities: no edema, no cyanosis    The results of significant diagnostics from this hospitalization (including imaging, microbiology, ancillary and laboratory) are listed below for reference.     Microbiology: No results found for this or any previous visit (from the past 240 hour(s)).   Labs: BNP (last 3 results) Recent Labs    05/22/18 2027  BNP 277.8*   Basic Metabolic Panel: Recent Labs  Lab 07/14/18 1451 07/16/18 0710  NA 138 142  K 4.1 3.8  CL 106  --   CO2 23  --   GLUCOSE 105* 87  BUN 15  --   CREATININE 1.40*  --   CALCIUM 8.9  --    Liver Function Tests: No results for input(s): AST, ALT, ALKPHOS,  BILITOT, PROT, ALBUMIN in the last 168 hours. No results for input(s): LIPASE, AMYLASE in the last 168 hours. No results for input(s): AMMONIA in the last 168 hours. CBC: Recent Labs  Lab 07/14/18 1451 07/15/18 0555 07/16/18 0710  WBC 8.4 4.2  --   HGB 4.4* 6.1* 8.8*  HCT 16.1* 20.0* 26.0*  MCV 97.0 92.2  --   PLT 429* 330  --    Cardiac Enzymes: No results for input(s): CKTOTAL, CKMB, CKMBINDEX, TROPONINI in the last 168 hours. BNP: Invalid input(s): POCBNP CBG: No results for input(s): GLUCAP in the last 168 hours. D-Dimer No results for input(s): DDIMER in the last 72 hours. Hgb A1c No results for input(s): HGBA1C in the last 72 hours. Lipid Profile No results for input(s): CHOL, HDL, LDLCALC, TRIG, CHOLHDL, LDLDIRECT in the last 72 hours. Thyroid function studies No results for input(s): TSH, T4TOTAL, T3FREE, THYROIDAB in the last 72 hours.  Invalid input(s): FREET3 Anemia work up Recent Labs    07/14/18 1856  VITAMINB12 518  FOLATE 38.0  FERRITIN 39  TIBC 515*  IRON 11*  RETICCTPCT 7.1*   Urinalysis    Component Value Date/Time   COLORURINE YELLOW 05/22/2018 2024   APPEARANCEUR CLEAR 05/22/2018 2024   LABSPEC >1.046 (H) 05/22/2018 2024   PHURINE 5.0 05/22/2018 2024   GLUCOSEU NEGATIVE 05/22/2018 2024   HGBUR LARGE (A) 05/22/2018 2024   BILIRUBINUR NEGATIVE 05/22/2018 2024   KETONESUR NEGATIVE 05/22/2018 2024   PROTEINUR 30 (A) 05/22/2018 2024   NITRITE NEGATIVE 05/22/2018 2024   LEUKOCYTESUR NEGATIVE 05/22/2018 2024   Sepsis Labs Invalid input(s): PROCALCITONIN,  WBC,  LACTICIDVEN Microbiology No results found for this or any previous visit (from the past 240 hour(s)).   Time coordinating discharge: 34 minutes  SIGNED:   Hosie Poisson, MD  [facility] 07/16/2018, 4:56 PM Pager   If 7PM-7AM, please contact night-coverage [email or web address] Password [email or web address]

## 2018-07-16 NOTE — Progress Notes (Signed)
   I do think patient can go home today if medical service thinks he is ready.  Gatha Mayer, MD, [facility] 07/16/2018 10:30 AM Pager [phone]

## 2018-07-16 NOTE — Op Note (Addendum)
[facility] Patient Name: Howard Thompson Procedure Date : 07/16/2018 MRN: [id number] Attending MD: Gatha Mayer , MD Date of Birth: [birth year] CSN: [id number] Age: 52 Admit Type: Inpatient Procedure:                Colonoscopy Indications:              Heme positive stool, Melena Providers:                Gatha Mayer, MD, Carlyn Reichert, RN, Elspeth Cho Tech., Technician, Luciana Axe, CRNA Referring MD:              Medicines:                Propofol per Anesthesia, Monitored Anesthesia Care Complications:            No immediate complications. Estimated Blood Loss:     Estimated blood loss was minimal. Procedure:                Pre-Anesthesia Assessment:                           - Prior to the procedure, a History and Physical                            was performed, and patient medications and                            allergies were reviewed. The patient's tolerance of                            previous anesthesia was also reviewed. The risks                            and benefits of the procedure and the sedation                            options and risks were discussed with the patient.                            All questions were answered, and informed consent                            was obtained. Prior Anticoagulants: The patient                            last took aspirin 2 days and antiplatelet                            medication (Brilinta) 2 days prior to the                            procedure. ASA Grade Assessment: III - A patient  with severe systemic disease. After reviewing the                            risks and benefits, the patient was deemed in                            satisfactory condition to undergo the procedure.                           After obtaining informed consent, the colonoscope                            was passed under direct vision. Throughout the                   procedure, the patient's blood pressure, pulse, and                            oxygen saturations were monitored continuously. The                            [id number]-[id number] ([id number]) peds colon was introduced                            through the anus and advanced to the the terminal                            ileum, with identification of the appendiceal                            orifice and IC valve. The colonoscopy was performed                            without difficulty. The patient tolerated the                            procedure well. The quality of the bowel                            preparation was adequate. The terminal ileum,                            ileocecal valve, appendiceal orifice, and rectum                            were photographed. Scope In: 7:49:28 AM Scope Out: 8:11:20 AM Scope Withdrawal Time: 0 hours 16 minutes 27 seconds  Total Procedure Duration: 0 hours 21 minutes 52 seconds  Findings:      The perianal and digital rectal examinations were normal. Pertinent       negatives include normal prostate (size, shape, and consistency).      A localized area of ? flat polyp mucosa was found in the ascending       colon. Biopsies were taken with a cold forceps for histology.       Verification of patient identification for  the specimen was done.       Estimated blood loss was minimal.      The exam was otherwise without abnormality on direct and retroflexion       views.      The terminal ileum appeared normal. Impression:               - ? flat polyp mucosa in the ascending colon.                            Biopsied.                           - The examination was otherwise normal on direct                            and retroflexion views.                           - The examined portion of the ileum was normal. Recommendation:           - Return patient to hospital ward for ongoing care.                           - See the other procedure  note for documentation of                            additional recommendations. EGD next Procedure Code(s):        --- Professional ---                           [phone], Colonoscopy, flexible; with biopsy, single                            or multiple Diagnosis Code(s):        --- Professional ---                           R19.5, Other fecal abnormalities                           K92.1, Melena (includes Hematochezia) CPT copyright 2017 [employer] Association. All rights reserved. The codes documented in this report are preliminary and upon coder review may  be revised to meet current compliance requirements. Gatha Mayer, MD 07/16/2018 8:52:41 AM This report has been signed electronically. Number of Addenda: 0

## 2018-07-16 NOTE — Transfer of Care (Signed)
Immediate Anesthesia Transfer of Care Note  Patient: Howard Thompson  Procedure(s) Performed: COLONOSCOPY WITH PROPOFOL (N/A ) ESOPHAGOGASTRODUODENOSCOPY (EGD) WITH PROPOFOL (N/A ) BIOPSY HOT HEMOSTASIS (ARGON PLASMA COAGULATION/BICAP) (N/A ) HEMOSTASIS CONTROL with clip  Patient Location: Endoscopy Unit  Anesthesia Type:MAC  Level of Consciousness: awake, oriented and patient cooperative  Airway & Oxygen Therapy: Patient Spontanous Breathing and Patient connected to nasal cannula oxygen  Post-op Assessment: Report given to RN and Post -op Vital signs reviewed and stable  Post vital signs: Reviewed  Last Vitals:  Vitals Value Taken Time  BP 106/69 07/16/2018  8:34 AM  Temp    Pulse 76 07/16/2018  8:35 AM  Resp 16 07/16/2018  8:35 AM  SpO2 100 % 07/16/2018  8:35 AM  Vitals shown include unvalidated device data.  Last Pain:  Vitals:   07/16/18 0708  TempSrc: Oral  PainSc: 0-No pain         Complications: No apparent anesthesia complications

## 2018-07-16 NOTE — Progress Notes (Signed)
Howard Thompson to be D/C'd Home per MD order.  Discussed prescriptions and follow up appointments with the patient. Prescriptions given to patient, medication list explained in detail. Pt verbalized understanding.  Allergies as of 07/16/2018   No Known Allergies     Medication List    STOP taking these medications   aspirin 81 MG chewable tablet   ticagrelor 90 MG Tabs tablet Commonly known as:  BRILINTA     TAKE these medications   amLODipine 10 MG tablet Commonly known as:  NORVASC Take 1 tablet (10 mg total) by mouth daily. What changed:  how much to take   atorvastatin 40 MG tablet Commonly known as:  LIPITOR Take 1 tablet (40 mg total) by mouth daily at 6 PM. What changed:  when to take this   clopidogrel 75 MG tablet Commonly known as:  PLAVIX Take 1 tablet (75 mg total) by mouth daily. Start taking on:  9/76/7341   folic acid 1 MG tablet Commonly known as:  FOLVITE Take 1 tablet (1 mg total) by mouth daily. Start taking on:  07/17/2018   furosemide 20 MG tablet Commonly known as:  LASIX Take 20 mg by mouth 2 (two) times daily.   multivitamin Tabs tablet Take 1 tablet by mouth at bedtime.   multivitamin with minerals Tabs tablet Take 1 tablet by mouth daily. Start taking on:  07/17/2018   nebivolol 2.5 MG tablet Commonly known as:  BYSTOLIC Take 1 tablet (2.5 mg total) by mouth at bedtime.   nitroGLYCERIN 0.4 MG SL tablet Commonly known as:  NITROSTAT Place 0.4 mg under the tongue every 5 (five) minutes as needed for chest pain.   pantoprazole 40 MG tablet Commonly known as:  PROTONIX Take 1 tablet (40 mg total) by mouth 2 (two) times daily.   QUEtiapine 50 MG tablet Commonly known as:  SEROQUEL Take 1 tablet (50 mg total) by mouth 2 (two) times daily. What changed:  when to take this   thiamine 100 MG tablet Take 1 tablet (100 mg total) by mouth daily. Start taking on:  07/17/2018       Vitals:   07/16/18 0840 07/16/18 0850  BP: 139/74 125/64   Pulse: 72 73  Resp: 20 12  Temp:    SpO2: 100% 100%    Skin clean, dry and intact without evidence of skin break down, no evidence of skin tears noted. IV catheter discontinued intact. Site without signs and symptoms of complications. Dressing and pressure applied. Pt denies pain at this time. No complaints noted.  An After Visit Summary was printed and given to the patient. Patient escorted via [place], and D/C home via private auto.

## 2018-07-16 NOTE — Anesthesia Preprocedure Evaluation (Addendum)
Anesthesia Evaluation  Patient identified by MRN, date of birth, ID band Patient awake    Reviewed: Allergy & Precautions, NPO status , Patient's Chart, lab work & pertinent test results, reviewed documented beta blocker date and time   History of Anesthesia Complications Negative for: history of anesthetic complications  Airway Mallampati: II  TM Distance: >3 FB Neck ROM: Full    Dental  (+) Teeth Intact, Dental Advisory Given   Pulmonary sleep apnea , COPD, Current Smoker,    breath sounds clear to auscultation       Cardiovascular + CAD, + Past MI and + Peripheral Vascular Disease   Rhythm:Regular Rate:Normal  Hx of STEMI with cardiac arrest July 2019, prolonged ICU stay complicated by ARF and shock liver   Neuro/Psych  Neuromuscular disease (myasthenia gravis)    GI/Hepatic negative GI ROS,   Endo/Other  negative endocrine ROS  Renal/GU Renal InsufficiencyRenal disease  negative genitourinary   Musculoskeletal negative musculoskeletal ROS (+)   Abdominal   Peds  Hematology  (+) anemia ,   Anesthesia Other Findings   Reproductive/Obstetrics                           Anesthesia Physical Anesthesia Plan  ASA: III  Anesthesia Plan: MAC   Post-op Pain Management:    Induction: Intravenous  PONV Risk Score and Plan: 0  Airway Management Planned: Natural Airway, Nasal Cannula and Mask  Additional Equipment:   Intra-op Plan:   Post-operative Plan:   Informed Consent: I have reviewed the patients History and Physical, chart, labs and discussed the procedure including the risks, benefits and alternatives for the proposed anesthesia with the patient or authorized representative who has indicated his/her understanding and acceptance.     Plan Discussed with: CRNA, Anesthesiologist and Surgeon  Anesthesia Plan Comments:        Anesthesia Quick Evaluation

## 2018-07-16 NOTE — Op Note (Signed)
[facility] Patient Name: Howard Thompson Procedure Date : 07/16/2018 MRN: [id number] Attending MD: Gatha Mayer , MD Date of Birth: 08-12-1966 CSN: [id number] Age: 52 Admit Type: Inpatient Procedure:                Upper GI endoscopy Indications:              Iron deficiency anemia, Heme positive stool, Melena Providers:                Gatha Mayer, MD, Carlyn Reichert, RN, Elspeth Cho Tech., Technician, Luciana Axe, CRNA Referring MD:              Medicines:                Propofol per Anesthesia, Monitored Anesthesia Care Complications:            No immediate complications. Estimated Blood Loss:     Estimated blood loss: none. Procedure:                Pre-Anesthesia Assessment:                           - Prior to the procedure, a History and Physical                            was performed, and patient medications and                            allergies were reviewed. The patient's tolerance of                            previous anesthesia was also reviewed. The risks                            and benefits of the procedure and the sedation                            options and risks were discussed with the patient.                            All questions were answered, and informed consent                            was obtained. Prior Anticoagulants: The patient                            last took aspirin 2 days and antiplatelet                            medication 2 days prior to the procedure. ASA Grade                            Assessment: III - A patient with severe systemic  disease. After reviewing the risks and benefits,                            the patient was deemed in satisfactory condition to                            undergo the procedure.                           - Prior to the procedure, a History and Physical                            was performed, and patient medications and                            allergies were reviewed. The patient's tolerance of                            previous anesthesia was also reviewed. The risks                            and benefits of the procedure and the sedation                            options and risks were discussed with the patient.                            All questions were answered, and informed consent                            was obtained. Prior Anticoagulants: The patient                            last took aspirin 2 days and antiplatelet                            medication 2 days prior to the procedure. ASA Grade                            Assessment: III - A patient with severe systemic                            disease. After reviewing the risks and benefits,                            the patient was deemed in satisfactory condition to                            undergo the procedure.                           After obtaining informed consent, the endoscope was  passed under direct vision. Throughout the                            procedure, the patient's blood pressure, pulse, and                            oxygen saturations were monitored continuously. The                            [id number]-[id number] ([id number] Olympus adult EGD was introduced                            through the mouth, and advanced to the second part                            of duodenum. The upper GI endoscopy was                            accomplished without difficulty. The patient                            tolerated the procedure well. Scope In: Scope Out: Findings:      A single 2 mm angiodysplastic lesion without bleeding was found in the       second portion of the duodenum. Coagulation for bleeding prevention       using argon plasma was successful. To prevent bleeding       post-intervention, one hemostatic clip was successfully placed (MR       conditional). There was no bleeding during, or at the  end, of the       procedure.      The Z-line was irregular and was found in the distal esophagus. The       columnar changes were not > 1 cm so were not biospied      The exam was otherwise without abnormality.      The cardia and gastric fundus were normal on retroflexion. Impression:               - A single non-bleeding angiodysplastic lesion in                            the duodenum. Treated with argon plasma coagulation                            (APC). Clip (MR conditional) was placed.                           - Z-line irregular, in the distal esophagus.                           - The examination was otherwise normal.                           - No specimens collected. Recommendation:           - Return patient to hospital ward for ongoing care.                           -  Would treat w/ PPI daily chronically                           I have ordered feraheme x 1 - he is Fe defic                           PCP could order another or could be set up after dc                            by [facility]                           I recommend restarting asa and Brilinta tomorrow                            given his fresh stent and recent PCI - DES                           I will f/u colon biopsies and arrange GI f/u IF                            needed                           Please do not schedule or tell him to call for an                            appointment as likely not needed Procedure Code(s):        --- Professional ---                           [phone], Esophagogastroduodenoscopy, flexible,                            transoral; with control of bleeding, any method Diagnosis Code(s):        --- Professional ---                           K31.819, Angiodysplasia of stomach and duodenum                            without bleeding                           K22.8, Other specified diseases of esophagus                           D50.9, Iron deficiency anemia, unspecified                            R19.5, Other fecal abnormalities                           K92.1, Melena (includes Hematochezia) CPT copyright 2017 [employer]. All rights reserved. The codes documented in this report are preliminary  and upon coder review may  be revised to meet current compliance requirements. Gatha Mayer, MD 07/16/2018 9:00:47 AM This report has been signed electronically. Number of Addenda: 0

## 2018-07-16 NOTE — Progress Notes (Signed)
Progress Note  Patient Name: Howard Thompson Date of Encounter: 07/16/2018  Primary Cardiologist: Valaria Good, MD   Subjective   No dyspnea or CP; energy improved  Inpatient Medications    Scheduled Meds: . sodium chloride   Intravenous Once  . atorvastatin  40 mg Oral q1800  . folic acid  1 mg Oral Daily  . furosemide  20 mg Oral BID  . multivitamin with minerals  1 tablet Oral Daily  . nebivolol  2.5 mg Oral QHS  . pantoprazole (PROTONIX) IV  40 mg Intravenous Q12H  . QUEtiapine  50 mg Oral QHS  . thiamine  100 mg Oral Daily   Continuous Infusions:  PRN Meds: LORazepam **OR** LORazepam, nitroGLYCERIN, zolpidem   Vital Signs    Vitals:   07/16/18 0708 07/16/18 0835 07/16/18 0840 07/16/18 0850  BP: 136/70  139/74 125/64  Pulse:   72 73  Resp: 12  20 12   Temp: 98.8 F (37.1 C) 97.6 F (36.4 C)    TempSrc: Oral Oral    SpO2: 100%  100% 100%  Weight: 64.6 kg     Height: 5\' 9"  (1.753 m)       Intake/Output Summary (Last 24 hours) at 07/16/2018 1259 Last data filed at 07/16/2018 0836 Gross per 24 hour  Intake 972.25 ml  Output 2 ml  Net 970.25 ml   Filed Weights   07/14/18 1433 07/14/18 2002 07/16/18 0708  Weight: 64.4 kg 64.3 kg 64.6 kg    Physical Exam   GEN: No acute distress.   Neck: No JVD Cardiac: RRR Respiratory: Clear to auscultation bilaterally. GI: Soft, nontender, non-distended  MS: No edema Neuro:  Nonfocal  Psych: Normal affect   Labs    Chemistry Recent Labs  Lab 07/14/18 1451 07/16/18 0710  NA 138 142  K 4.1 3.8  CL 106  --   CO2 23  --   GLUCOSE 105* 87  BUN 15  --   CREATININE 1.40*  --   CALCIUM 8.9  --   GFRNONAA 57*  --   GFRAA >60  --   ANIONGAP 9  --      Hematology Recent Labs  Lab 07/14/18 1451 07/14/18 1856 07/15/18 0555 07/16/18 0710  WBC 8.4  --  4.2  --   RBC 1.66* 1.51* 2.17*  --   HGB 4.4*  --  6.1* 8.8*  HCT 16.1*  --  20.0* 26.0*  MCV 97.0  --  92.2  --   MCH 26.5  --  28.1  --   MCHC  27.3*  --  30.5  --   RDW 19.3*  --  16.8*  --   PLT 429*  --  330  --     Recent Labs  Lab 07/14/18 1459  TROPIPOC 0.00     Patient Profile     52 year old male with past medical history of substance abuse, myasthenia gravis, peripheral artery disease, recent admission following ventricular fibrillation arrest with PCI of his BOF-7/51- (course complicated by cardiogenic shock, GI bleed, acute renal failure requiring hemodialysis, anemia and thrombocytopenia) now admitted with what is felt to be GI blood loss/anemia for further evaluation. Initial hgb 4.4. Last echocardiogram August 2019 showed ejection fraction 40 to 45%.    Patient underwent EGD earlier today.  A 2 mm angiodysplastic lesion was noted and treated.  He had coagulation for bleeding prevention using argon plasma and hemostatic clip placed.  Assessment & Plan    1 acute blood  loss anemia-hemoglobin improved following transfusion.  He has undergone EGD by gastroenterology with results as outlined above.  Continue Protonix.  2 coronary artery disease-status post recent PCI of LAD with drug-eluting stent July 27.    Given GI bleed his aspirin and Brilinta were discontinued.  Gastroenterology feels safe to resume antiplatelet therapy tomorrow.  Would begin Plavix 75 mg daily long-term.  No aspirin or Brilinta.  3 ischemic cardiomyopathy-continue beta-blocker at present dose.  4 tobacco abuse-patient previously counseled on discontinuing.  5 cocaine abuse-patient previously counseled on discontinuing.  [facility] will sign off.   Medication Recommendations: Discontinue aspirin and Brilinta.  Begin Plavix 75 mg daily tomorrow. Other recommendations (labs, testing, etc): No additional cardiac testing. Follow up as an outpatient: Follow-up with his cardiologist at [facility] in 2 to 4 weeks.  For questions or updates, please contact [place] Please consult [email or web address] for contact info under         Signed, Kirk Ruths, MD  07/16/2018, 12:59 PM

## 2018-07-16 NOTE — Interval H&P Note (Signed)
History and Physical Interval Note:  07/16/2018 7:32 AM  Howard Thompson  has presented today for surgery, with the diagnosis of anemia, FOBT + dark stool, brilinta after LAD stent.  duodenitis on EGD  4 weeks ago.  The various methods of treatment have been discussed with the patient and family. After consideration of risks, benefits and other options for treatment, the patient has consented to  Procedure(s): COLONOSCOPY WITH PROPOFOL (N/A) ESOPHAGOGASTRODUODENOSCOPY (EGD) WITH PROPOFOL (N/A) as a surgical intervention .  The patient's history has been reviewed, patient examined, no change in status, stable for surgery.  I have reviewed the patient's chart and labs.  Questions were answered to the patient's satisfaction.     Silvano Rusk

## 2018-07-16 NOTE — Anesthesia Procedure Notes (Signed)
Procedure Name: MAC Date/Time: 07/16/2018 7:39 AM Performed by: Jenne Campus, CRNA Pre-anesthesia Checklist: Patient identified, Emergency Drugs available, Suction available and Patient being monitored Patient Re-evaluated:Patient Re-evaluated prior to induction Oxygen Delivery Method: Nasal cannula

## 2018-07-19 ENCOUNTER — Encounter (HOSPITAL_COMMUNITY): Payer: Self-pay | Admitting: Internal Medicine

## 2018-07-19 NOTE — Anesthesia Postprocedure Evaluation (Signed)
Anesthesia Post Note  Patient: Bernhard Koskinen  Procedure(s) Performed: COLONOSCOPY WITH PROPOFOL (N/A ) ESOPHAGOGASTRODUODENOSCOPY (EGD) WITH PROPOFOL (N/A ) BIOPSY HOT HEMOSTASIS (ARGON PLASMA COAGULATION/BICAP) (N/A ) HEMOSTASIS CONTROL/CLIP     Patient location during evaluation: PACU Anesthesia Type: MAC Level of consciousness: awake and alert Pain management: pain level controlled Vital Signs Assessment: post-procedure vital signs reviewed and stable Respiratory status: spontaneous breathing, nonlabored ventilation, respiratory function stable and patient connected to nasal cannula oxygen Cardiovascular status: stable and blood pressure returned to baseline Postop Assessment: no apparent nausea or vomiting Anesthetic complications: no    Last Vitals:  Vitals:   07/16/18 0840 07/16/18 0850  BP: 139/74 125/64  Pulse: 72 73  Resp: 20 12  Temp:    SpO2: 100% 100%    Last Pain:  Vitals:   07/16/18 0900  TempSrc:   PainSc: 0-No pain   Pain Goal:                 Lidia Collum

## 2018-07-20 NOTE — Addendum Note (Signed)
Addendum  created 07/20/18 1250 by Jenne Campus, CRNA   Intraprocedure Event edited

## 2018-07-28 ENCOUNTER — Encounter: Payer: Self-pay | Admitting: Internal Medicine

## 2018-07-28 NOTE — Progress Notes (Signed)
Hyperplastic ascending polyp - bx Recall 1 year - letter created and routed

## 2018-12-16 ENCOUNTER — Encounter (HOSPITAL_COMMUNITY): Payer: Self-pay | Admitting: Cardiovascular Disease

## 2018-12-16 ENCOUNTER — Inpatient Hospital Stay (HOSPITAL_COMMUNITY)
Admission: EM | Admit: 2018-12-16 | Discharge: 2018-12-20 | DRG: 270 | Disposition: A | Discharge status: Home / Self Care | Payer: 59 | Attending: Cardiovascular Disease | Admitting: Cardiovascular Disease

## 2018-12-16 ENCOUNTER — Emergency Department (HOSPITAL_COMMUNITY): Payer: 59

## 2018-12-16 ENCOUNTER — Inpatient Hospital Stay (HOSPITAL_COMMUNITY)
Admission: EM | Disposition: A | Discharge status: Home / Self Care | Payer: Self-pay | Source: Home / Self Care | Attending: Cardiovascular Disease

## 2018-12-16 ENCOUNTER — Inpatient Hospital Stay (HOSPITAL_COMMUNITY): Payer: 59

## 2018-12-16 DIAGNOSIS — I2102 ST elevation (STEMI) myocardial infarction involving left anterior descending coronary artery: Secondary | ICD-10-CM

## 2018-12-16 DIAGNOSIS — I255 Ischemic cardiomyopathy: Secondary | ICD-10-CM | POA: Diagnosis present

## 2018-12-16 DIAGNOSIS — Z452 Encounter for adjustment and management of vascular access device: Secondary | ICD-10-CM

## 2018-12-16 DIAGNOSIS — R570 Cardiogenic shock: Secondary | ICD-10-CM

## 2018-12-16 DIAGNOSIS — D509 Iron deficiency anemia, unspecified: Secondary | ICD-10-CM | POA: Diagnosis present

## 2018-12-16 DIAGNOSIS — I252 Old myocardial infarction: Secondary | ICD-10-CM

## 2018-12-16 DIAGNOSIS — T82867A Thrombosis of cardiac prosthetic devices, implants and grafts, initial encounter: Secondary | ICD-10-CM

## 2018-12-16 DIAGNOSIS — I97190 Other postprocedural cardiac functional disturbances following cardiac surgery: Principal | ICD-10-CM | POA: Diagnosis present

## 2018-12-16 DIAGNOSIS — R079 Chest pain, unspecified: Secondary | ICD-10-CM

## 2018-12-16 DIAGNOSIS — Z91128 Patient's intentional underdosing of medication regimen for other reason: Secondary | ICD-10-CM | POA: Diagnosis not present

## 2018-12-16 DIAGNOSIS — Z8719 Personal history of other diseases of the digestive system: Secondary | ICD-10-CM

## 2018-12-16 DIAGNOSIS — T45526A Underdosing of antithrombotic drugs, initial encounter: Secondary | ICD-10-CM | POA: Diagnosis present

## 2018-12-16 DIAGNOSIS — Z955 Presence of coronary angioplasty implant and graft: Secondary | ICD-10-CM

## 2018-12-16 DIAGNOSIS — Y92009 Unspecified place in unspecified non-institutional (private) residence as the place of occurrence of the external cause: Secondary | ICD-10-CM

## 2018-12-16 DIAGNOSIS — K7200 Acute and subacute hepatic failure without coma: Secondary | ICD-10-CM | POA: Diagnosis not present

## 2018-12-16 DIAGNOSIS — F17210 Nicotine dependence, cigarettes, uncomplicated: Secondary | ICD-10-CM | POA: Diagnosis present

## 2018-12-16 DIAGNOSIS — J449 Chronic obstructive pulmonary disease, unspecified: Secondary | ICD-10-CM | POA: Diagnosis present

## 2018-12-16 DIAGNOSIS — Z79899 Other long term (current) drug therapy: Secondary | ICD-10-CM | POA: Diagnosis not present

## 2018-12-16 DIAGNOSIS — E876 Hypokalemia: Secondary | ICD-10-CM

## 2018-12-16 DIAGNOSIS — E785 Hyperlipidemia, unspecified: Secondary | ICD-10-CM | POA: Diagnosis present

## 2018-12-16 DIAGNOSIS — G7000 Myasthenia gravis without (acute) exacerbation: Secondary | ICD-10-CM | POA: Diagnosis present

## 2018-12-16 DIAGNOSIS — I739 Peripheral vascular disease, unspecified: Secondary | ICD-10-CM | POA: Diagnosis present

## 2018-12-16 DIAGNOSIS — F1910 Other psychoactive substance abuse, uncomplicated: Secondary | ICD-10-CM | POA: Diagnosis present

## 2018-12-16 DIAGNOSIS — I2510 Atherosclerotic heart disease of native coronary artery without angina pectoris: Secondary | ICD-10-CM

## 2018-12-16 DIAGNOSIS — Z9114 Patient's other noncompliance with medication regimen: Secondary | ICD-10-CM

## 2018-12-16 DIAGNOSIS — I213 ST elevation (STEMI) myocardial infarction of unspecified site: Secondary | ICD-10-CM | POA: Diagnosis present

## 2018-12-16 HISTORY — PX: IABP INSERTION: CATH118242

## 2018-12-16 HISTORY — DX: Hypomagnesemia: E83.42

## 2018-12-16 HISTORY — PX: CORONARY/GRAFT ACUTE MI REVASCULARIZATION: CATH118305

## 2018-12-16 HISTORY — PX: LEFT HEART CATH AND CORONARY ANGIOGRAPHY: CATH118249

## 2018-12-16 HISTORY — DX: Angiodysplasia of colon without hemorrhage: K55.20

## 2018-12-16 HISTORY — DX: Hypokalemia: E87.6

## 2018-12-16 HISTORY — DX: Iron deficiency anemia, unspecified: D50.9

## 2018-12-16 LAB — RAPID URINE DRUG SCREEN, HOSP PERFORMED
Amphetamines: POSITIVE — AB
Barbiturates: NOT DETECTED
Benzodiazepines: POSITIVE — AB
Cocaine: POSITIVE — AB
Opiates: NOT DETECTED
Tetrahydrocannabinol: POSITIVE — AB

## 2018-12-16 LAB — ECHOCARDIOGRAM COMPLETE
Height: 69 in
Weight: 2640 oz

## 2018-12-16 LAB — COMPREHENSIVE METABOLIC PANEL
ALT: 75 U/L — ABNORMAL HIGH (ref 0–44)
AST: 560 U/L — ABNORMAL HIGH (ref 15–41)
Albumin: 3.5 g/dL (ref 3.5–5.0)
Alkaline Phosphatase: 91 U/L (ref 38–126)
Anion gap: 14 (ref 5–15)
BUN: 7 mg/dL (ref 6–20)
CHLORIDE: 94 mmol/L — AB (ref 98–111)
CO2: 21 mmol/L — ABNORMAL LOW (ref 22–32)
Calcium: 8.4 mg/dL — ABNORMAL LOW (ref 8.9–10.3)
Creatinine, Ser: 0.86 mg/dL (ref 0.61–1.24)
GFR calc Af Amer: >60 mL/min (ref >60)
GFR calc non Af Amer: >60 mL/min (ref >60)
GLUCOSE: 121 mg/dL — AB (ref 70–99)
Potassium: 3.1 mmol/L — ABNORMAL LOW (ref 3.5–5.1)
SODIUM: 129 mmol/L — AB (ref 135–145)
Total Bilirubin: 1 mg/dL (ref 0.3–1.2)
Total Protein: 6.9 g/dL (ref 6.5–8.1)

## 2018-12-16 LAB — POCT I-STAT TROPONIN I

## 2018-12-16 LAB — T4, FREE: Free T4: 0.97 ng/dL (ref 0.82–1.77)

## 2018-12-16 LAB — BASIC METABOLIC PANEL
Anion gap: 13 (ref 5–15)
BUN: 8 mg/dL (ref 6–20)
CHLORIDE: 96 mmol/L — AB (ref 98–111)
CO2: 20 mmol/L — AB (ref 22–32)
Calcium: 8.6 mg/dL — ABNORMAL LOW (ref 8.9–10.3)
Creatinine, Ser: 0.85 mg/dL (ref 0.61–1.24)
GFR calc non Af Amer: >60 mL/min (ref >60)
Glucose, Bld: 128 mg/dL — ABNORMAL HIGH (ref 70–99)
POTASSIUM: 3.4 mmol/L — AB (ref 3.5–5.1)
SODIUM: 129 mmol/L — AB (ref 135–145)

## 2018-12-16 LAB — CBC
HEMATOCRIT: 38.7 % — AB (ref 39.0–52.0)
HEMOGLOBIN: 12.1 g/dL — AB (ref 13.0–17.0)
MCH: 23.4 pg — AB (ref 26.0–34.0)
MCHC: 31.3 g/dL (ref 30.0–36.0)
MCV: 74.7 fL — ABNORMAL LOW (ref 80.0–100.0)
Platelets: 333 10*3/uL (ref 150–400)
RBC: 5.18 MIL/uL (ref 4.22–5.81)
RDW: 17.2 % — ABNORMAL HIGH (ref 11.5–15.5)
WBC: 13.8 10*3/uL — ABNORMAL HIGH (ref 4.0–10.5)
nRBC: 0 % (ref 0.0–0.2)

## 2018-12-16 LAB — TROPONIN I: Troponin I: >65 ng/mL (ref <0.03)

## 2018-12-16 LAB — POCT ACTIVATED CLOTTING TIME: ACTIVATED CLOTTING TIME: 362 s

## 2018-12-16 LAB — HEMOGLOBIN A1C
Hgb A1c MFr Bld: 5.1 % (ref 4.8–5.6)
Mean Plasma Glucose: 99.67 mg/dL

## 2018-12-16 LAB — MRSA PCR SCREENING: MRSA by PCR: NEGATIVE

## 2018-12-16 LAB — MAGNESIUM: Magnesium: 1.4 mg/dL — ABNORMAL LOW (ref 1.7–2.4)

## 2018-12-16 LAB — TSH: TSH: 1.463 u[IU]/mL (ref 0.350–4.500)

## 2018-12-16 SURGERY — CORONARY/GRAFT ACUTE MI REVASCULARIZATION
Anesthesia: LOCAL

## 2018-12-16 MED ORDER — VITAMIN B-1 100 MG PO TABS
100.0000 mg | ORAL_TABLET | Freq: Every day | ORAL | Status: DC
  Administered 2018-12-17 – 2018-12-20 (×4): 100 mg via ORAL
  Filled 2018-12-16 (×4): qty 1

## 2018-12-16 MED ORDER — SODIUM CHLORIDE 0.9% FLUSH
3.0000 mL | Freq: Two times a day (BID) | INTRAVENOUS | Status: DC
  Administered 2018-12-16 – 2018-12-20 (×5): 3 mL via INTRAVENOUS

## 2018-12-16 MED ORDER — NITROGLYCERIN 1 MG/10 ML FOR IR/CATH LAB
INTRA_ARTERIAL | Status: AC
  Filled 2018-12-16: qty 10

## 2018-12-16 MED ORDER — MIDAZOLAM HCL 2 MG/2ML IJ SOLN
INTRAMUSCULAR | Status: AC
  Filled 2018-12-16: qty 2

## 2018-12-16 MED ORDER — HYDRALAZINE HCL 20 MG/ML IJ SOLN: 5.0000 mg | INTRAMUSCULAR | Status: AC | PRN

## 2018-12-16 MED ORDER — ADULT MULTIVITAMIN W/MINERALS CH
1.0000 | ORAL_TABLET | Freq: Every day | ORAL | Status: DC
  Administered 2018-12-17 – 2018-12-20 (×4): 1 via ORAL
  Filled 2018-12-16 (×4): qty 1

## 2018-12-16 MED ORDER — BIVALIRUDIN TRIFLUOROACETATE 250 MG IV SOLR
INTRAVENOUS | Status: AC
  Filled 2018-12-16: qty 250

## 2018-12-16 MED ORDER — FUROSEMIDE 10 MG/ML IJ SOLN
INTRAMUSCULAR | Status: AC
  Filled 2018-12-16: qty 4

## 2018-12-16 MED ORDER — FENTANYL CITRATE (PF) 100 MCG/2ML IJ SOLN
INTRAMUSCULAR | Status: AC
  Filled 2018-12-16: qty 2

## 2018-12-16 MED ORDER — SODIUM CHLORIDE 0.9 % IV SOLN
INTRAVENOUS | Status: DC
  Administered 2018-12-17: 01:00:00 via INTRAVENOUS

## 2018-12-16 MED ORDER — TICAGRELOR 90 MG PO TABS
ORAL_TABLET | ORAL | Status: AC
  Filled 2018-12-16: qty 2

## 2018-12-16 MED ORDER — QUETIAPINE FUMARATE 50 MG PO TABS
50.0000 mg | ORAL_TABLET | Freq: Every day | ORAL | Status: DC
  Administered 2018-12-16 – 2018-12-17 (×2): 50 mg via ORAL
  Filled 2018-12-16 (×2): qty 1

## 2018-12-16 MED ORDER — SODIUM CHLORIDE 0.9% FLUSH: 3.0000 mL | INTRAVENOUS | Status: DC | PRN

## 2018-12-16 MED ORDER — ALPRAZOLAM 0.25 MG PO TABS: 0.2500 mg | ORAL_TABLET | Freq: Two times a day (BID) | ORAL | Status: DC | PRN

## 2018-12-16 MED ORDER — SODIUM CHLORIDE 0.9 % IV SOLN
INTRAVENOUS | Status: AC | PRN
  Administered 2018-12-16: 1.75 mg/kg/h via INTRAVENOUS

## 2018-12-16 MED ORDER — ASPIRIN 81 MG PO CHEW: 324.0000 mg | CHEWABLE_TABLET | ORAL | Status: DC

## 2018-12-16 MED ORDER — ONDANSETRON HCL 4 MG/2ML IJ SOLN: 4.0000 mg | Freq: Four times a day (QID) | INTRAMUSCULAR | Status: DC | PRN

## 2018-12-16 MED ORDER — LABETALOL HCL 5 MG/ML IV SOLN: 10.0000 mg | INTRAVENOUS | Status: AC | PRN

## 2018-12-16 MED ORDER — FUROSEMIDE 10 MG/ML IJ SOLN
INTRAMUSCULAR | Status: DC | PRN
  Administered 2018-12-16: 40 mg via INTRAVENOUS

## 2018-12-16 MED ORDER — IOHEXOL 350 MG/ML SOLN
INTRAVENOUS | Status: DC | PRN
  Administered 2018-12-16: 355 mL via INTRA_ARTERIAL

## 2018-12-16 MED ORDER — ATORVASTATIN CALCIUM 80 MG PO TABS: 80.0000 mg | ORAL_TABLET | Freq: Every day | ORAL | Status: DC

## 2018-12-16 MED ORDER — NITROGLYCERIN 1 MG/10 ML FOR IR/CATH LAB
INTRA_ARTERIAL | Status: DC | PRN
  Administered 2018-12-16 (×6): 200 ug via INTRACORONARY

## 2018-12-16 MED ORDER — SODIUM CHLORIDE 0.9 % IV SOLN: 250.0000 mL | INTRAVENOUS | Status: DC | PRN

## 2018-12-16 MED ORDER — VERAPAMIL HCL 2.5 MG/ML IV SOLN
INTRAVENOUS | Status: AC
  Filled 2018-12-16: qty 2

## 2018-12-16 MED ORDER — SODIUM CHLORIDE 0.9 % IV BOLUS: 500.0000 mL | Freq: Once | INTRAVENOUS | Status: DC

## 2018-12-16 MED ORDER — ATORVASTATIN CALCIUM 80 MG PO TABS
80.0000 mg | ORAL_TABLET | Freq: Every day | ORAL | Status: DC
  Administered 2018-12-16: 80 mg via ORAL
  Filled 2018-12-16: qty 1

## 2018-12-16 MED ORDER — FOLIC ACID 1 MG PO TABS
1.0000 mg | ORAL_TABLET | Freq: Every day | ORAL | Status: DC
  Administered 2018-12-17 – 2018-12-20 (×4): 1 mg via ORAL
  Filled 2018-12-16 (×4): qty 1

## 2018-12-16 MED ORDER — ADENOSINE (DIAGNOSTIC) FOR INTRACORONARY USE
INTRAVENOUS | Status: DC | PRN
  Administered 2018-12-16 (×3): 24 ug via INTRACORONARY

## 2018-12-16 MED ORDER — TIROFIBAN (AGGRASTAT) BOLUS VIA INFUSION
INTRAVENOUS | Status: DC | PRN
  Administered 2018-12-16: 1870 ug via INTRAVENOUS

## 2018-12-16 MED ORDER — ZOLPIDEM TARTRATE 5 MG PO TABS
5.0000 mg | ORAL_TABLET | Freq: Every evening | ORAL | Status: DC | PRN
  Administered 2018-12-16: 5 mg via ORAL
  Filled 2018-12-16: qty 1

## 2018-12-16 MED ORDER — ASPIRIN 300 MG RE SUPP: 300.0000 mg | RECTAL | Status: DC

## 2018-12-16 MED ORDER — ONDANSETRON HCL 4 MG/2ML IJ SOLN
INTRAMUSCULAR | Status: DC | PRN
  Administered 2018-12-16: 4 mg via INTRAVENOUS

## 2018-12-16 MED ORDER — SODIUM CHLORIDE 0.9 % IV SOLN: INTRAVENOUS | Status: AC

## 2018-12-16 MED ORDER — TIROFIBAN HCL IN NACL 5-0.9 MG/100ML-% IV SOLN
INTRAVENOUS | Status: AC | PRN
  Administered 2018-12-16 (×2): 0.15 ug/kg/min via INTRAVENOUS

## 2018-12-16 MED ORDER — HEPARIN (PORCINE) 25000 UT/250ML-% IV SOLN
1400.0000 [IU]/h | INTRAVENOUS | Status: DC
  Administered 2018-12-16: 750 [IU]/h via INTRAVENOUS
  Administered 2018-12-17: 1150 [IU]/h via INTRAVENOUS
  Filled 2018-12-16 (×2): qty 250

## 2018-12-16 MED ORDER — PANTOPRAZOLE SODIUM 40 MG PO TBEC
40.0000 mg | DELAYED_RELEASE_TABLET | Freq: Two times a day (BID) | ORAL | Status: DC
  Administered 2018-12-16 – 2018-12-20 (×8): 40 mg via ORAL
  Filled 2018-12-16 (×8): qty 1

## 2018-12-16 MED ORDER — ONDANSETRON HCL 4 MG/2ML IJ SOLN
INTRAMUSCULAR | Status: AC
  Filled 2018-12-16: qty 2

## 2018-12-16 MED ORDER — HYDROMORPHONE HCL 1 MG/ML IJ SOLN: 0.5000 mg | Freq: Once | INTRAMUSCULAR | Status: DC

## 2018-12-16 MED ORDER — FENTANYL CITRATE (PF) 100 MCG/2ML IJ SOLN
INTRAMUSCULAR | Status: DC | PRN
  Administered 2018-12-16 (×2): 25 ug via INTRAVENOUS

## 2018-12-16 MED ORDER — HEPARIN SODIUM (PORCINE) 1000 UNIT/ML IJ SOLN
INTRAMUSCULAR | Status: AC
  Filled 2018-12-16: qty 1

## 2018-12-16 MED ORDER — DIAZEPAM 5 MG PO TABS
5.0000 mg | ORAL_TABLET | Freq: Four times a day (QID) | ORAL | Status: DC | PRN
  Administered 2018-12-17 – 2018-12-20 (×7): 5 mg via ORAL
  Filled 2018-12-16 (×7): qty 1

## 2018-12-16 MED ORDER — PERFLUTREN LIPID MICROSPHERE
4.0000 mL | Freq: Once | INTRAVENOUS | Status: AC
  Administered 2018-12-16: 4 mL via INTRAVENOUS

## 2018-12-16 MED ORDER — TIROFIBAN HCL IN NACL 5-0.9 MG/100ML-% IV SOLN
INTRAVENOUS | Status: AC
  Filled 2018-12-16: qty 100

## 2018-12-16 MED ORDER — LIDOCAINE HCL (PF) 1 % IJ SOLN
INTRAMUSCULAR | Status: AC
  Filled 2018-12-16: qty 30

## 2018-12-16 MED ORDER — SODIUM CHLORIDE 0.9 % IV SOLN
INTRAVENOUS | Status: AC | PRN
  Administered 2018-12-16 (×2): 1.75 mg/kg/h via INTRAVENOUS

## 2018-12-16 MED ORDER — TIROFIBAN HCL IN NACL 5-0.9 MG/100ML-% IV SOLN
0.1500 ug/kg/min | INTRAVENOUS | Status: DC
  Administered 2018-12-16 – 2018-12-17 (×3): 0.15 ug/kg/min via INTRAVENOUS
  Filled 2018-12-16: qty 100

## 2018-12-16 MED ORDER — VERAPAMIL HCL 2.5 MG/ML IV SOLN
INTRAVENOUS | Status: DC | PRN
  Administered 2018-12-16: 11:00:00 via INTRA_ARTERIAL

## 2018-12-16 MED ORDER — HEPARIN (PORCINE) IN NACL 1000-0.9 UT/500ML-% IV SOLN
INTRAVENOUS | Status: AC
  Filled 2018-12-16: qty 500

## 2018-12-16 MED ORDER — ACETAMINOPHEN 325 MG PO TABS: 650.0000 mg | ORAL_TABLET | ORAL | Status: DC | PRN

## 2018-12-16 MED ORDER — MIDAZOLAM HCL 2 MG/2ML IJ SOLN
INTRAMUSCULAR | Status: DC | PRN
  Administered 2018-12-16: 1 mg via INTRAVENOUS
  Administered 2018-12-16: 2 mg via INTRAVENOUS
  Administered 2018-12-16: 1 mg via INTRAVENOUS

## 2018-12-16 MED ORDER — HEPARIN (PORCINE) 25000 UT/250ML-% IV SOLN: 800.0000 [IU]/h | INTRAVENOUS | Status: DC

## 2018-12-16 MED ORDER — PERFLUTREN LIPID MICROSPHERE
INTRAVENOUS | Status: AC
  Filled 2018-12-16: qty 10

## 2018-12-16 MED ORDER — POTASSIUM CHLORIDE CRYS ER 20 MEQ PO TBCR
40.0000 meq | EXTENDED_RELEASE_TABLET | ORAL | Status: AC
  Administered 2018-12-16 (×2): 40 meq via ORAL
  Filled 2018-12-16 (×2): qty 2

## 2018-12-16 MED ORDER — TICAGRELOR 90 MG PO TABS
90.0000 mg | ORAL_TABLET | Freq: Two times a day (BID) | ORAL | Status: DC
  Administered 2018-12-16 – 2018-12-20 (×8): 90 mg via ORAL
  Filled 2018-12-16 (×8): qty 1

## 2018-12-16 MED ORDER — NITROGLYCERIN 0.4 MG SL SUBL: 0.4000 mg | SUBLINGUAL_TABLET | SUBLINGUAL | Status: DC | PRN

## 2018-12-16 MED ORDER — SODIUM CHLORIDE 0.9 % IV SOLN
1.7500 mg/kg/h | INTRAVENOUS | Status: AC
  Filled 2018-12-16: qty 250

## 2018-12-16 MED ORDER — NEBIVOLOL HCL 2.5 MG PO TABS
2.5000 mg | ORAL_TABLET | Freq: Every day | ORAL | Status: DC
  Administered 2018-12-16: 2.5 mg via ORAL
  Filled 2018-12-16: qty 1

## 2018-12-16 MED ORDER — ASPIRIN 81 MG PO CHEW
81.0000 mg | CHEWABLE_TABLET | Freq: Every day | ORAL | Status: DC
  Administered 2018-12-17 – 2018-12-20 (×4): 81 mg via ORAL
  Filled 2018-12-16 (×4): qty 1

## 2018-12-16 MED ORDER — ASPIRIN EC 81 MG PO TBEC: 81.0000 mg | DELAYED_RELEASE_TABLET | Freq: Every day | ORAL | Status: DC

## 2018-12-16 MED ORDER — BIVALIRUDIN BOLUS VIA INFUSION - CUPID
INTRAVENOUS | Status: DC | PRN
  Administered 2018-12-16: 56.1 mg via INTRAVENOUS

## 2018-12-16 MED ORDER — ADENOSINE 6 MG/2ML IV SOLN
INTRAVENOUS | Status: AC
  Filled 2018-12-16: qty 2

## 2018-12-16 MED ORDER — TICAGRELOR 90 MG PO TABS
ORAL_TABLET | ORAL | Status: DC | PRN
  Administered 2018-12-16: 180 mg via ORAL

## 2018-12-16 MED ORDER — NITROGLYCERIN IN D5W 200-5 MCG/ML-% IV SOLN
INTRAVENOUS | Status: AC | PRN
  Administered 2018-12-16: 10 ug/min via INTRAVENOUS

## 2018-12-16 MED ORDER — ASPIRIN 81 MG PO CHEW: 243.0000 mg | CHEWABLE_TABLET | Freq: Once | ORAL | Status: DC

## 2018-12-16 MED ORDER — NITROGLYCERIN 2 % TD OINT
1.0000 [in_us] | TOPICAL_OINTMENT | Freq: Once | TRANSDERMAL | Status: DC
  Filled 2018-12-16: qty 30

## 2018-12-16 MED ORDER — LIDOCAINE HCL (PF) 1 % IJ SOLN
INTRAMUSCULAR | Status: DC | PRN
  Administered 2018-12-16: 10 mL
  Administered 2018-12-16: 2 mL

## 2018-12-16 SURGICAL SUPPLY — 30 items
BALLN EUPHORA RX 2.0X12 (BALLOONS) ×2
BALLN IABP SENSA PLUS 8F 50CC (BALLOONS) ×2
BALLN MINITREK OTW 1.5X20 (BALLOONS) ×2
BALLN SAPPHIRE 2.5X20 (BALLOONS) ×2
BALLN SAPPHIRE 3.0X15 (BALLOONS) ×2
BALLN SAPPHIRE ~~LOC~~ 3.75X18 (BALLOONS) ×2 IMPLANT
BALLOON EUPHORA RX 2.0X12 (BALLOONS) ×1 IMPLANT
BALLOON IABP SENS PLUS 8F 50CC (BALLOONS) ×1 IMPLANT
BALLOON MINITREK OTW 1.5X20 (BALLOONS) ×1 IMPLANT
BALLOON SAPPHIRE 2.5X20 (BALLOONS) ×1 IMPLANT
BALLOON SAPPHIRE 3.0X15 (BALLOONS) ×1 IMPLANT
CATH EXTRAC PRONTO 5.5F 138CM (CATHETERS) ×2 IMPLANT
CATH OPTITORQUE TIG 4.0 5F (CATHETERS) ×2 IMPLANT
CATH TELEPORT (CATHETERS) ×2 IMPLANT
CATH VISTA GUIDE 6FR XBLAD3.5 (CATHETERS) ×2 IMPLANT
DEVICE RAD COMP TR BAND LRG (VASCULAR PRODUCTS) ×2 IMPLANT
ELECT DEFIB PAD ADLT CADENCE (PAD) ×2 IMPLANT
GLIDESHEATH SLEND SS 6F .021 (SHEATH) ×2 IMPLANT
GUIDEWIRE INQWIRE 1.5J.035X260 (WIRE) ×1 IMPLANT
INQWIRE 1.5J .035X260CM (WIRE) ×2
KIT ENCORE 26 ADVANTAGE (KITS) ×2 IMPLANT
KIT HEART LEFT (KITS) ×2 IMPLANT
PACK CARDIAC CATHETERIZATION (CUSTOM PROCEDURE TRAY) ×2 IMPLANT
SHEATH PINNACLE 6F 10CM (SHEATH) ×2 IMPLANT
STENT RESOLUTE ONYX 3.5X38 (Permanent Stent) ×2 IMPLANT
TRANSDUCER W/STOPCOCK (MISCELLANEOUS) ×2 IMPLANT
TUBING CIL FLEX 10 FLL-RA (TUBING) ×2 IMPLANT
WIRE EMERALD 3MM-J .035X150CM (WIRE) ×2 IMPLANT
WIRE PT2 MS 185 (WIRE) ×2 IMPLANT
WIRE PT2 MS 300CM (WIRE) ×2 IMPLANT

## 2018-12-16 NOTE — Progress Notes (Signed)
Orthopedic Tech Progress Note Patient Details:  Howard Thompson 1965/12/13 [id number]  Ortho Devices Type of Ortho Device: Knee Immobilizer Ortho Device/Splint Interventions: Ordered, Application, Adjustment   Post Interventions Patient Tolerated: Well Instructions Provided: Adjustment of device, Care of device   Kalila Adkison J Elisse Pennick 12/16/2018, 3:15 PM

## 2018-12-16 NOTE — Care Management (Signed)
CM consult for Brilinta cost acknowledged. Brilinta benefits check sent and pending.  Midge Minium RN, BSN, NCM-BC, ACM-RN [phone]

## 2018-12-16 NOTE — ED Notes (Signed)
Report called to cath lab

## 2018-12-16 NOTE — Care Management (Signed)
#  3.   S/W    SHANYA B. @ [facility] # [phone]  BRILINTA  90 MG  BID  COVER- YES CO-PAY-  $  300.00 TIER- 4 DRUG PRIOR APPROVAL- NO 90 DAY SUPPLY FOR M/O $900.00  NO DEDUCTIBLE OUT-OF-POCKET: NOT MET  PREFERRED PHARMACY : YES   [facility] AND [facility] M/O  ALTERNATIVE :  1. CLOPIDOGREL 75 MG BID COVER- YES CO-PAY- $ 10.00 TIER- 1 DRUG PRIOR APPROVAL- NO 90 DAY SUPPLY FOR M/O $ 30.00  2. PRASUGREL  10 MG BID COVER- YES CO-PAY- $ 140.00 TIER- 3 DRUG PRIOR APPROVAL- NO 90 DAY SUPPLY FOR M/O  $ 420.00

## 2018-12-16 NOTE — Progress Notes (Signed)
ANTICOAGULATION CONSULT NOTE - Initial Consult  Pharmacy Consult for heparin and tirofiban Indication: chest pain/ACS in setting of IABP  No Known Allergies  Patient Measurements: Height: 5\' 9"  (175.3 cm) Weight: 165 lb (74.8 kg) IBW/kg (Calculated) : 70.7 Heparin Dosing Weight: 74.8  Vital Signs: Temp: 98.4 F (36.9 C) (02/20 1613) Temp Source: Oral (02/20 1613) BP: 142/81 (02/20 1800) Pulse Rate: 110 (02/20 1800)  Labs: Recent Labs    12/16/18 1020 12/16/18 1505  HGB 12.1*  --   HCT 38.7*  --   PLT 333  --   CREATININE 0.85 0.86  TROPONINI  --  >65.00*    Estimated Creatinine Clearance: 101.6 mL/min (by C-G formula based on SCr of 0.86 mg/dL).   Medical History: Past Medical History:  Diagnosis Date  . Acute renal failure ([place])    a. a. during 7-05/2018 adm for cardiac arrest, requiring HD temporarily.  . Alcohol abuse   . Anemia   . Cardiac arrest ([place]) 05/22/2018  . Cocaine abuse ([place])   . COPD (chronic obstructive pulmonary disease) ([place])   . Coronary artery disease   . Duodenitis   . GI bleed   . History of blood transfusion   . Hyperlipidemia   . Ischemic cardiomyopathy    a. EF <25% at time of cath/arrest 04/2018, improved to 40-45% by f/u echo 06/09/18.  . Myasthenia gravis ([place])   . Myocardial infarction ([place]) 05/22/2018   STEMI  . PAD (peripheral artery disease) ([place])    a. femoral bypass  . Shock liver    a. during 7-05/2018 adm for cardiac arrest.  . Thrombocytopenia ([place]) 04/2018  . Tobacco abuse   . Ventricular fibrillation (H[facility])     Medications:  Scheduled:  . aspirin  81 mg Oral Daily  . atorvastatin  80 mg Oral q1800  . [START ON 4/00/8676] folic acid  1 mg Oral Daily  .  HYDROmorphone (DILAUDID) injection  0.5 mg Intravenous Once  . [START ON 12/17/2018] multivitamin with minerals  1 tablet Oral Daily  . nebivolol  2.5 mg Oral QHS  . nitroGLYCERIN  1 inch Topical Once  . pantoprazole  40 mg Oral BID  . QUEtiapine  50 mg Oral QHS   . sodium chloride flush  3 mL Intravenous Q12H  . [START ON 12/17/2018] thiamine  100 mg Oral Daily  . ticagrelor  90 mg Oral BID   Infusions:  . sodium chloride    . sodium chloride 50 mL/hr at 12/16/18 1430  . sodium chloride    . heparin    . sodium chloride 10 mL/hr at 12/16/18 1051  . tirofiban 0.15 mcg/kg/min (12/16/18 1441)    Assessment: 53 yo M with CAD, hx of DES to LAD in July 2019 but has been off P2Y12 therapy due to massive GI bleed and AVMs. Presented with total re-occlusion of LAD requiring thrombectomy, DES to LAD, and IABP placement during cath procedure.  Bivalirudin was continued for 2 hours post-procedure (ended at 1950). Plan for tirofiban to continue for 18 hours. Given IABP in place, pharmacy consulted to start heparin infusion.   Hgb 12.1, plt 362. No s/sx of bleeding post-cath.   Goal of Therapy:  Heparin goal: 0.2-0.5 Monitor platelets by anticoagulation protocol: Yes   Plan:  Start heparin infusion at 750 units/hr tonight at 2000 Obtain heparin level 6 hours later Tirofiban 0.15 mcg/kg/min for at least 18 hours, MD to reassess in AM Monitor daily HL, CBC, and s/sx of bleeding  Joelene Millin  Rogene Houston, PharmD, [place] Clinical Pharmacist  Pager: [phone] Phone: [phone] 12/16/2018       6:19 PM

## 2018-12-16 NOTE — ED Triage Notes (Signed)
Pt arrives to Ed with complaints of cp since yesterday morning, hx of MI. Code stemi called. Pt transported to cath lab.

## 2018-12-16 NOTE — Progress Notes (Signed)
ANTICOAGULATION CONSULT NOTE - Initial Consult  Pharmacy Consult for bivalirudin and tirofiban Indication: chest pain/ACS  No Known Allergies  Patient Measurements: Height: 5\' 9"  (175.3 cm) Weight: 165 lb (74.8 kg) IBW/kg (Calculated) : 70.7 Heparin Dosing Weight: 74.8  Vital Signs: Temp: 97.5 F (36.4 C) (02/20 1005) Temp Source: Oral (02/20 1005) BP: 126/70 (02/20 1230) Pulse Rate: 117 (02/20 1005)  Labs: Recent Labs    12/16/18 1020  HGB 12.1*  HCT 38.7*  PLT 333  CREATININE 0.85    Estimated Creatinine Clearance: 102.8 mL/min (by C-G formula based on SCr of 0.85 mg/dL).   Medical History: Past Medical History:  Diagnosis Date  . Acute renal failure ([place])    a. a. during 7-05/2018 adm for cardiac arrest, requiring HD temporarily.  . Alcohol abuse   . Anemia   . Cardiac arrest ([place]) 05/22/2018  . Cocaine abuse ([place])   . COPD (chronic obstructive pulmonary disease) ([place])   . Coronary artery disease   . Duodenitis   . GI bleed   . History of blood transfusion   . Hyperlipidemia   . Ischemic cardiomyopathy    a. EF <25% at time of cath/arrest 04/2018, improved to 40-45% by f/u echo 06/09/18.  . Myasthenia gravis ([place])   . Myocardial infarction ([place]) 05/22/2018   STEMI  . PAD (peripheral artery disease) ([place])    a. femoral bypass  . Shock liver    a. during 7-05/2018 adm for cardiac arrest.  . Thrombocytopenia ([place]) 04/2018  . Tobacco abuse   . Ventricular fibrillation ([facility])     Medications:  Scheduled:  . aspirin  243 mg Oral Once  . aspirin  324 mg Oral NOW   Or  . aspirin  300 mg Rectal NOW  . [START ON 12/17/2018] aspirin EC  81 mg Oral Daily  . atorvastatin  80 mg Oral q1800  .  HYDROmorphone (DILAUDID) injection  0.5 mg Intravenous Once  . nitroGLYCERIN  1 inch Topical Once   Infusions:  . sodium chloride    . sodium chloride 10 mL/hr at 12/16/18 1051    Assessment: 53 yo M with CAD, hx of DES to LAD in July 2019 but has been off P2Y12  therapy due to massive GI bleed and AVMs. Presented with total re-occlusion of LAD requiring thrombectomy, DES to LAD, and IABP placement during cath procedure.  Bivalirudin bolus (56.1 mg) given at 1053 followed by infusion of 1.75 mg/kg/hr. Pharmacy has been consulted to continue bivalirudin for 2 hours post procedure. Procedure end time noted at 1315.  Tirofiban bolus (1870 mcg) given at 1108 followed by infusion of 0.15 mcg/kg/min. Pharmacy has been consulted to continue tirofiban for at least 18 hours. Dr. Claiborne Billings states he will reassess duration of infusion during AM rounds on 2/21.  Goal of Therapy:  Monitor platelets by anticoagulation protocol: Yes   Plan:  Bivalirudin 1.75 mg/kg/hr until 1515, then off Tirofiban 0.15 mcg/kg/min for at least 18 hours, MD to reassess in AM Monitor daily CBC and s/sx of bleeding  Vertis Kelch, PharmD PGY1 Pharmacy Resident Phone [phone] 12/16/2018       2:15 PM

## 2018-12-16 NOTE — ED Provider Notes (Signed)
[facility] Emergency Department Provider Note MRN:  [id number]  Arrival date & time: 12/16/18     Chief Complaint   Code STEMI   History of Present Illness   Howard Thompson is a 53 y.o. year-old male with a history of CAD, MI presenting to the ED with chief complaint of chest pain.  Pain is been present for 30 hours according to patient, was initially intermittent, became constant this morning.  Currently 6 out of 10, central chest radiating to left arm, feels exactly like his prior heart attack.  Explains that he was on Brilinta for his prior MI, had stent placement, was taken off Brilinta 1 month ago.  Review of Systems  A complete 10 system review of systems was obtained and all systems are negative except as noted in the HPI and PMH.   Patient's Health History    Past Medical History:  Diagnosis Date  . Acute renal failure ([place])    a. a. during 7-05/2018 adm for cardiac arrest, requiring HD temporarily.  . Alcohol abuse   . Anemia   . Cardiac arrest ([place]) 05/22/2018  . Cocaine abuse ([place])   . COPD (chronic obstructive pulmonary disease) ([place])   . Coronary artery disease   . Duodenitis   . GI bleed   . History of blood transfusion   . Hyperlipidemia   . Ischemic cardiomyopathy    a. EF <25% at time of cath/arrest 04/2018, improved to 40-45% by f/u echo 06/09/18.  . Myasthenia gravis ([place])   . Myocardial infarction ([place]) 05/22/2018   STEMI  . PAD (peripheral artery disease) ([place])    a. femoral bypass  . Shock liver    a. during 7-05/2018 adm for cardiac arrest.  . Thrombocytopenia ([place]) 04/2018  . Tobacco abuse   . Ventricular fibrillation [facility])     Past Surgical History:  Procedure Laterality Date  . ATHERECTOMY    . BIOPSY  07/16/2018   Procedure: BIOPSY;  Surgeon: Gatha Mayer, MD;  Location: [facility] ENDOSCOPY;  Service: Endoscopy;;  . CARDIAC CATHETERIZATION  05/22/2018   stent  . COLONOSCOPY WITH PROPOFOL N/A 07/16/2018   Procedure: COLONOSCOPY  WITH PROPOFOL;  Surgeon: Gatha Mayer, MD;  Location: [facility] ENDOSCOPY;  Service: Endoscopy;  Laterality: N/A;  . CORONARY ANGIOPLASTY    . CORONARY STENT INTERVENTION N/A 05/22/2018   Procedure: CORONARY STENT INTERVENTION;  Surgeon: Lorretta Harp, MD;  Location: [place] CV LAB;  Service: Cardiovascular;  Laterality: N/A;  . ESOPHAGOGASTRODUODENOSCOPY (EGD) WITH PROPOFOL N/A 06/10/2018   Procedure: ESOPHAGOGASTRODUODENOSCOPY (EGD) WITH PROPOFOL;  Surgeon: Ladene Artist, MD;  Location: [facility] ENDOSCOPY;  Service: Endoscopy;  Laterality: N/A;  . ESOPHAGOGASTRODUODENOSCOPY (EGD) WITH PROPOFOL N/A 07/16/2018   Procedure: ESOPHAGOGASTRODUODENOSCOPY (EGD) WITH PROPOFOL;  Surgeon: Gatha Mayer, MD;  Location: [place];  Service: Endoscopy;  Laterality: N/A;  . FEMORAL ARTERY - POPLITEAL ARTERY BYPASS GRAFT    . HOT HEMOSTASIS N/A 07/16/2018   Procedure: HOT HEMOSTASIS (ARGON PLASMA COAGULATION/BICAP);  Surgeon: Gatha Mayer, MD;  Location: [facility] ENDOSCOPY;  Service: Endoscopy;  Laterality: N/A;  . IR FLUORO GUIDE CV LINE RIGHT  05/31/2018  . IR REMOVAL TUN CV CATH W/O FL  06/21/2018  . IR US GUIDE VASC ACCESS RIGHT  05/31/2018  . LEFT HEART CATH AND CORONARY ANGIOGRAPHY N/A 05/22/2018   Procedure: LEFT HEART CATH AND CORONARY ANGIOGRAPHY;  Surgeon: Lorretta Harp, MD;  Location: [place] CV LAB;  Service: Cardiovascular;  Laterality: N/A;  Family History  Problem Relation Age of Onset  . Alzheimer's disease Father     Social History   Socioeconomic History  . Marital status: Divorced    Spouse name: Not on file  . Number of children: Not on file  . Years of education: Not on file  . Highest education level: Not on file  Occupational History    Employer: [employer]  Social Needs  . Financial resource strain: Not on file  . Food insecurity:    Worry: Not on file    Inability: Not on file  . Transportation needs:    Medical: Not on file    Non-medical: Not on  file  Tobacco Use  . Smoking status: Current Every Day Smoker    Packs/day: 1.00    Types: Cigarettes  . Smokeless tobacco: Current User  Substance and Sexual Activity  . Alcohol use: Not on file  . Drug use: Never  . Sexual activity: Not on file  Lifestyle  . Physical activity:    Days per week: Not on file    Minutes per session: Not on file  . Stress: Not on file  Relationships  . Social connections:    Talks on phone: Not on file    Gets together: Not on file    Attends religious service: Not on file    Active member of club or organization: Not on file    Attends meetings of clubs or organizations: Not on file    Relationship status: Not on file  . Intimate partner violence:    Fear of current or ex partner: Not on file    Emotionally abused: Not on file    Physically abused: Not on file    Forced sexual activity: Not on file  Other Topics Concern  . Not on file  Social History Narrative   Divorced   Employed at [employer]   Polysubstance abuse     Physical Exam  Vital Signs and Nursing Notes reviewed Vitals:   12/16/18 1005  BP: (!) 153/91  Pulse: (!) 117  Resp: 20  Temp: (!) 97.5 F (36.4 C)  SpO2: 100%    CONSTITUTIONAL: Well-appearing, NAD NEURO:  Alert and oriented x 3, no focal deficits EYES:  eyes equal and reactive ENT/NECK:  no LAD, no JVD CARDIO: Tachycardic rate, well-perfused, normal S1 and S2 PULM:  CTAB no wheezing or rhonchi GI/GU:  normal bowel sounds, non-distended, non-tender MSK/SPINE:  No gross deformities, no edema SKIN:  no rash, atraumatic PSYCH:  Appropriate speech and behavior  Diagnostic and Interventional Summary    EKG Interpretation  Date/Time:  Thursday December 16 2018 10:04:50 EST Ventricular Rate:  117 PR Interval:    QRS Duration: 87 QT Interval:  294 QTC Calculation: 411 R Axis:   82 Text Interpretation:  Sinus tachycardia Multiple ventricular premature complexes Probable left atrial enlargement  Left ventricular hypertrophy Probable anterolateral infarct, acute >>> Acute MI <<< Confirmed by Gerlene Fee [id number]) on 12/16/2018 10:16:04 AM      Labs Reviewed  CBC - Abnormal; Notable for the following components:      Result Value   WBC 13.8 (*)    Hemoglobin 12.1 (*)    HCT 38.7 (*)    MCV 74.7 (*)    MCH 23.4 (*)    RDW 17.2 (*)    All other components within normal limits  BASIC METABOLIC PANEL - Abnormal; Notable for the following components:   Sodium 129 (*)  Potassium 3.4 (*)    Chloride 96 (*)    CO2 20 (*)    Glucose, Bld 128 (*)    Calcium 8.6 (*)    All other components within normal limits  POCT I-STAT TROPONIN I - Abnormal; Notable for the following components:   Troponin i, poc >30.00 (*)    All other components within normal limits  I-STAT TROPONIN, ED    DG Chest [facility] 1 View    (Results Pending)    Medications  HYDROmorphone (DILAUDID) injection 0.5 mg ( Intravenous Automatically Held 12/16/18 1030)  aspirin chewable tablet 243 mg ( Oral Automatically Held 12/16/18 1030)  nitroGLYCERIN (NITROGLYN) 2 % ointment 1 inch ( Topical Automatically Held 12/16/18 1030)  sodium chloride 0.9 % bolus 500 mL ( Intravenous Rate/Dose Change 12/16/18 1051)  lidocaine (PF) (XYLOCAINE) 1 % injection (2 mLs Infiltration Given 12/16/18 1041)  Radial Cocktail/Verapamil only ( Intra-arterial Given 12/16/18 1041)  midazolam (VERSED) injection (2 mg Intravenous Given 12/16/18 1041)  fentaNYL (SUBLIMAZE) injection (25 mcg Intravenous Given 12/16/18 1041)  bivalirudin (ANGIOMAX) BOLUS via infusion (56.1 mg Intravenous Given 12/16/18 1053)  bivalirudin (ANGIOMAX) 250 mg in sodium chloride 0.9 % 50 mL (5 mg/mL) infusion (1.75 mg/kg/hr  74.8 kg Intravenous New Bag/Given 12/16/18 1055)  ticagrelor (BRILINTA) tablet (180 mg Oral Given 12/16/18 1056)  tirofiban (AGGRASTAT) bolus via infusion (1,870 mcg Intravenous Given 12/16/18 1108)  tirofiban (AGGRASTAT) infusion 50 mcg/mL 100 mL (0.15  mcg/kg/min  74.8 kg Intravenous New Bag/Given 12/16/18 1113)  nitroGLYCERIN 1 mg/10 mL (100 mcg/mL) - IR/CATH LAB (200 mcg Intracoronary Given 12/16/18 1113)  nitroGLYCERIN 50 mg in dextrose 5 % 250 mL (0.2 mg/mL) infusion (10 mcg/min Intravenous New Bag/Given 12/16/18 1117)  adenosine (ADENOSCAN) for intracoronary use (24 mcg Intracoronary Given 12/16/18 1123)     Procedures Critical Care Critical Care Documentation Critical care time provided by me (excluding procedures): 35 minutes  Condition necessitating critical care: ST elevation myocardial infarction  Components of critical care management: reviewing of prior records, laboratory and imaging interpretation, frequent re-examination and reassessment of vital signs, administration of aspirin, IV heparin, nitroglycerin, IV Dilaudid, initiation of code STEMI protocol, discussion with consulting services.   ED Course and Medical Decision Making  I have reviewed the triage vital signs and the nursing notes.  Pertinent labs & imaging results that were available during my care of the patient were reviewed by me and considered in my medical decision making (see below for details).  Code STEMI, history of the same, hemodynamically stable, anticipating PCI.  Brought to the Cath Lab in stable condition.  Barth Kirks. Sedonia Small, MD [place] [email or web address]  Final Clinical Impressions(s) / ED Diagnoses     ICD-10-CM   1. ST elevation myocardial infarction (STEMI), unspecified artery (HCC) I21.3   2. Chest pain R07.9 DG Chest [facility] 1 View    DG Chest [facility]    ED Discharge Orders    None         Maudie Flakes, MD 12/16/18 [phone]

## 2018-12-16 NOTE — H&P (Addendum)
Cardiology Admission History and Physical:   Patient ID: Howard Thompson MRN: [id number]; DOB: [birth year]   Admission date: 12/16/2018  Primary Care Provider: Jamesetta Geralds, MD Primary Cardiologist: Valaria Good, MD  Primary Electrophysiologist:  None   Chief Complaint:  Chest pain  Patient Profile:   Howard Thompson is a 53 y.o. male with CAD, hx of arrest July 2019 was in V fib, shocked 4 times -went to cath lab and LAD stenosis with DES, severe LV systolic dysfunction.  EF 25% by cath.  By August EF improved.   Pt only on ASA not on Brilinta or plavix due to massive GI bleed, AVMS.   Also hx of iron def anemia, prior thymectomy for myasthenia gravis, cocaine abuse.     History of Present Illness:   Mr. Mofield was hospitalized for out of hospital arrest and STEMI of LAD.  It was V fib arrest.  He shock, acute renal failure, thrombocytopenia, ischemic cardiomyopathy with EF 25% but by august improved to 45%. AKI with dialysis but stopped prior to discharge.  He has anemia, respiratory failure.  Then outpt GI bleed with Hgb to 4.4.  Also with femoral artery bypass.   Pt continues to smoke 1 ppd.   Now back with ST elevation MI ant wall.  Pain began yesterday but increased today.    Na 129, k+ 3.4, Cr 0.85  Troponin >30  WBC 13.8, Hgb 12.1, Plts 333.   CXR pending   Brought to cath lab emergently.  We did call his wife and she is on way to hospital.     Past Medical History:  Diagnosis Date  . Acute renal failure ([place])    a. a. during 7-05/2018 adm for cardiac arrest, requiring HD temporarily.  . Alcohol abuse   . Anemia   . Cardiac arrest ([place]) 05/22/2018  . Cocaine abuse ([place])   . COPD (chronic obstructive pulmonary disease) ([place])   . Coronary artery disease   . Duodenitis   . GI bleed   . History of blood transfusion   . Hyperlipidemia   . Ischemic cardiomyopathy    a. EF <25% at time of cath/arrest 04/2018, improved to 40-45% by f/u echo 06/09/18.  . Myasthenia  gravis ([place])   . Myocardial infarction ([place]) 05/22/2018   STEMI  . PAD (peripheral artery disease) ([place])    a. femoral bypass  . Shock liver    a. during 7-05/2018 adm for cardiac arrest.  . Thrombocytopenia ([place]) 04/2018  . Tobacco abuse   . Ventricular fibrillation [facility])     Past Surgical History:  Procedure Laterality Date  . ATHERECTOMY    . BIOPSY  07/16/2018   Procedure: BIOPSY;  Surgeon: Gatha Mayer, MD;  Location: [facility] ENDOSCOPY;  Service: Endoscopy;;  . CARDIAC CATHETERIZATION  05/22/2018   stent  . COLONOSCOPY WITH PROPOFOL N/A 07/16/2018   Procedure: COLONOSCOPY WITH PROPOFOL;  Surgeon: Gatha Mayer, MD;  Location: [facility] ENDOSCOPY;  Service: Endoscopy;  Laterality: N/A;  . CORONARY ANGIOPLASTY    . CORONARY STENT INTERVENTION N/A 05/22/2018   Procedure: CORONARY STENT INTERVENTION;  Surgeon: Lorretta Harp, MD;  Location: [place] CV LAB;  Service: Cardiovascular;  Laterality: N/A;  . ESOPHAGOGASTRODUODENOSCOPY (EGD) WITH PROPOFOL N/A 06/10/2018   Procedure: ESOPHAGOGASTRODUODENOSCOPY (EGD) WITH PROPOFOL;  Surgeon: Ladene Artist, MD;  Location: [facility] ENDOSCOPY;  Service: Endoscopy;  Laterality: N/A;  . ESOPHAGOGASTRODUODENOSCOPY (EGD) WITH PROPOFOL N/A 07/16/2018   Procedure: ESOPHAGOGASTRODUODENOSCOPY (EGD) WITH PROPOFOL;  Surgeon: Gatha Mayer, MD;  Location: [facility] ENDOSCOPY;  Service: Endoscopy;  Laterality: N/A;  . FEMORAL ARTERY - POPLITEAL ARTERY BYPASS GRAFT    . HOT HEMOSTASIS N/A 07/16/2018   Procedure: HOT HEMOSTASIS (ARGON PLASMA COAGULATION/BICAP);  Surgeon: Gatha Mayer, MD;  Location: [facility] ENDOSCOPY;  Service: Endoscopy;  Laterality: N/A;  . IR FLUORO GUIDE CV LINE RIGHT  05/31/2018  . IR REMOVAL TUN CV CATH W/O FL  06/21/2018  . IR US GUIDE VASC ACCESS RIGHT  05/31/2018  . LEFT HEART CATH AND CORONARY ANGIOGRAPHY N/A 05/22/2018   Procedure: LEFT HEART CATH AND CORONARY ANGIOGRAPHY;  Surgeon: Lorretta Harp, MD;  Location: [place] CV LAB;  Service:  Cardiovascular;  Laterality: N/A;     Medications Prior to Admission: Prior to Admission medications   Medication Sig Start Date End Date Taking? Authorizing Provider  amLODipine (NORVASC) 10 MG tablet Take 1 tablet (10 mg total) by mouth daily. Patient taking differently: Take 5 mg by mouth daily.  06/22/18 07/22/18  Amin, Jeanella Flattery, MD  atorvastatin (LIPITOR) 40 MG tablet Take 1 tablet (40 mg total) by mouth daily at 6 PM. Patient taking differently: Take 40 mg by mouth at bedtime.  06/21/18 07/21/18  Damita Lack, MD  folic acid (FOLVITE) 1 MG tablet Take 1 tablet (1 mg total) by mouth daily. 07/17/18   Hosie Poisson, MD  furosemide (LASIX) 20 MG tablet Take 20 mg by mouth 2 (two) times daily. 06/29/18   [provider]  Multiple Vitamin (MULTIVITAMIN WITH MINERALS) TABS tablet Take 1 tablet by mouth daily. 07/17/18   Hosie Poisson, MD  nebivolol (BYSTOLIC) 2.5 MG tablet Take 1 tablet (2.5 mg total) by mouth at bedtime. 06/21/18 07/21/18  Amin, Jeanella Flattery, MD  nitroGLYCERIN (NITROSTAT) 0.4 MG SL tablet Place 0.4 mg under the tongue every 5 (five) minutes as needed for chest pain.    [provider]  pantoprazole (PROTONIX) 40 MG tablet Take 1 tablet (40 mg total) by mouth 2 (two) times daily. 06/21/18 07/21/18  Amin, Jeanella Flattery, MD  QUEtiapine (SEROQUEL) 50 MG tablet Take 1 tablet (50 mg total) by mouth 2 (two) times daily. Patient taking differently: Take 50 mg by mouth at bedtime.  06/21/18 07/21/18  Damita Lack, MD  thiamine 100 MG tablet Take 1 tablet (100 mg total) by mouth daily. 07/17/18   Hosie Poisson, MD     Allergies:   No Known Allergies  Social History:   Social History   Socioeconomic History  . Marital status: Divorced    Spouse name: Not on file  . Number of children: Not on file  . Years of education: Not on file  . Highest education level: Not on file  Occupational History    Employer: [employer]  Social Needs  . Financial  resource strain: Not on file  . Food insecurity:    Worry: Not on file    Inability: Not on file  . Transportation needs:    Medical: Not on file    Non-medical: Not on file  Tobacco Use  . Smoking status: Current Every Day Smoker    Packs/day: 1.00    Types: Cigarettes  . Smokeless tobacco: Current User  Substance and Sexual Activity  . Alcohol use: Not on file  . Drug use: Never  . Sexual activity: Not on file  Lifestyle  . Physical activity:    Days per week: Not on file    Minutes per session: Not on file  . Stress:  Not on file  Relationships  . Social connections:    Talks on phone: Not on file    Gets together: Not on file    Attends religious service: Not on file    Active member of club or organization: Not on file    Attends meetings of clubs or organizations: Not on file    Relationship status: Not on file  . Intimate partner violence:    Fear of current or ex partner: Not on file    Emotionally abused: Not on file    Physically abused: Not on file    Forced sexual activity: Not on file  Other Topics Concern  . Not on file  Social History Narrative   Divorced   Employed at [employer] abuse    Family History:   The patient's family history includes Alzheimer's disease in his father.    ROS:  Please see the history of present illness.  All other ROS reviewed and negative.   General:no colds or fevers, no weight changes Skin:no rashes or ulcers HEENT:no blurred vision, no congestion CV:see HPI PUL:see HPI GI:no diarrhea constipation or melena, no indigestion GU:no hematuria, no dysuria MS:no joint pain, no claudication Neuro:no syncope, no lightheadedness Endo:no diabetes, no thyroid disease   Physical Exam/Data:   Vitals:   12/16/18 1005  BP: (!) 153/91  Pulse: (!) 117  Resp: 20  Temp: (!) 97.5 F (36.4 C)  TempSrc: Oral  SpO2: 100%   No intake or output data in the 24 hours ending 12/16/18 1039 Last 3  Weights 07/16/2018 07/14/2018 07/14/2018  Weight (lbs) 142 lb 6.7 oz 141 lb 12.1 oz 142 lb  Weight (kg) 64.6 kg 64.3 kg 64.411 kg  Some encounter information is confidential and restricted. Go to Review Flowsheets activity to see all data.     There is no height or weight on file to calculate BMI.  General:  Well nourished, well developed, in no acute distress but anxious HEENT: normal Lymph: no adenopathy Neck: + JVD Endocrine:  No thryomegaly Vascular: No carotid bruits; pedal pulses 3+ bilaterally  Cardiac:  normal S1, S2; RRR; no murmur, gallup rub or click  Lungs:  clear to auscultation bilaterally, no wheezing, rhonchi or rales  Abd: soft, nontender, no hepatomegaly  Ext: no edema Musculoskeletal:  No deformities, BUE and BLE strength normal and equal Skin: warm and dry  Neuro:  CNs 2-12 intact, no focal abnormalities noted Psych:  Normal affect    EKG:  The ECG that was done in ER with ST elevation ant wall was personally reviewed.   Relevant CV Studies: Echo 05/2018 Study Conclusions  - Left ventricle: The cavity size was normal. Systolic function was   mildly to moderately reduced. The estimated ejection fraction was   in the range of 40% to 45%. Images were inadequate for LV wall   motion assessment. The study is not technically sufficient to   allow evaluation of LV diastolic function. - Aortic valve: There was mild regurgitation. - Mitral valve: Calcified annulus. There was mild regurgitation. - Atrial septum: There was increased thickness of the septum,   consistent with lipomatous hypertrophy. - Pulmonary arteries: Systolic pressure could not be accurately   estimated.  Recommendations:  Suggest definity contrast study to evaluate wall motion. Complications:  Apical images were extremely painful for the patient due to his broken ribs.  Lt Cath 05/22/18  Ost LAD to Prox LAD lesion is 100% stenosed.  A drug-eluting stent  was successfully placed using a STENT  SYNERGY DES 3X20.  Post intervention, there is a 0% residual stenosis.  There is severe left ventricular systolic dysfunction.  LV end diastolic pressure is mildly elevated.  The left ventricular ejection fraction is less than 25% by visual estimate.     Laboratory Data:  ChemistryNo results for input(s): NA, K, CL, CO2, GLUCOSE, BUN, CREATININE, CALCIUM, GFRNONAA, GFRAA, ANIONGAP in the last 168 hours.  No results for input(s): PROT, ALBUMIN, AST, ALT, ALKPHOS, BILITOT in the last 168 hours. Hematology Recent Labs  Lab 12/16/18 1020  WBC 13.8*  RBC 5.18  HGB 12.1*  HCT 38.7*  MCV 74.7*  MCH 23.4*  MCHC 31.3  RDW 17.2*  PLT 333   Cardiac EnzymesNo results for input(s): TROPONINI in the last 168 hours.  Recent Labs  Lab 12/16/18 1023  TROPIPOC >30.00*    BNPNo results for input(s): BNP, PROBNP in the last 168 hours.  DDimer No results for input(s): DDIMER in the last 168 hours.  Radiology/Studies:  No results found.  Assessment and Plan:   1. STEMI  Of ant wall.  To cath lab emergently - pain began yesterday and troponin POC >30.  2. CAD with hx of cardiac arrest- v fib and stent to LAD, July 2019 prolonged hospitalization with AKI and dialysis resolved 3. HLD will check, High dose statin 4. Hx of massive GI bleed and plavix was stopped. 5. PAD with fem pop bypass   Severity of Illness: The appropriate patient status for this patient is INPATIENT. Inpatient status is judged to be reasonable and necessary in order to provide the required intensity of service to ensure the patient's safety. The patient's presenting symptoms, physical exam findings, and initial radiographic and laboratory data in the context of their chronic comorbidities is felt to place them at high risk for further clinical deterioration. Furthermore, it is not anticipated that the patient will be medically stable for discharge from the hospital within 2 midnights of admission. The following factors  support the patient status of inpatient.   " The patient's presenting symptoms include acute chest pain. " The worrisome physical exam findings include continued pain. " The initial radiographic and laboratory data are worrisome because of STEMI. " The chronic co-morbidities include anemia with hx GI bleed, AKI on last STEMI.   * I certify that at the point of admission it is my clinical judgment that the patient will require inpatient hospital care spanning beyond 2 midnights from the point of admission due to high intensity of service, high risk for further deterioration and high frequency of surveillance required.*    For questions or updates, please contact [place] Please consult [email or web address] for contact info under    Signed, Cecilie Kicks, NP  12/16/2018 10:39 AM  Mr. Rayan Ines is a 53 year old gentleman who is originally from [place].  He suffered a cardiac arrest and was found to have anterior STEMI complicated by initial VF at which time he underwent stenting of his proximal LAD with insertion of a 3.0 x 20 mm Synergy DES stent by Dr. Alvester Chou.  Initial EF was 25% and subsequently this had improved to 45% several months later.  Has a history of tobacco use, he had been on Brilinta aspirin but apparently may have developed GI bleed ultimately leading to discontinuance.  He is status post sternotomy for thymectomy for myasthenia gravis has a history of anemia, as well as cocaine abuse.  He apparently is separated from his wife.  Yesterday he began to notice recurrent severe chest pain.  His chest pain is persisted for over 30 hours.  He is now brought to the hospital where ECG shows anterior STEMI and he is brought emergently to the catheterization laboratory.  Upon presentation he is having significant chest pain.  Plan emergent cardiac catheterization with probable PCI with high likelihood of acute thrombotic occlusion of his previously placed stent.

## 2018-12-17 ENCOUNTER — Encounter (HOSPITAL_COMMUNITY): Payer: Self-pay | Admitting: Cardiovascular Disease

## 2018-12-17 ENCOUNTER — Telehealth: Payer: Self-pay | Admitting: Cardiovascular Disease

## 2018-12-17 LAB — LIPID PANEL
Cholesterol: 144 mg/dL (ref 0–200)
HDL: 40 mg/dL — ABNORMAL LOW (ref >40)
LDL Cholesterol: 90 mg/dL (ref 0–99)
Total CHOL/HDL Ratio: 3.6 RATIO
Triglycerides: 70 mg/dL (ref <150)
VLDL: 14 mg/dL (ref 0–40)

## 2018-12-17 LAB — HEPATIC FUNCTION PANEL
ALT: 70 U/L — ABNORMAL HIGH (ref 0–44)
AST: 400 U/L — ABNORMAL HIGH (ref 15–41)
Albumin: 3 g/dL — ABNORMAL LOW (ref 3.5–5.0)
Alkaline Phosphatase: 79 U/L (ref 38–126)
Bilirubin, Direct: 0.1 mg/dL (ref 0.0–0.2)
Indirect Bilirubin: 0.7 mg/dL (ref 0.3–0.9)
Total Bilirubin: 0.8 mg/dL (ref 0.3–1.2)
Total Protein: 6.6 g/dL (ref 6.5–8.1)

## 2018-12-17 LAB — TROPONIN I: Troponin I: >65 ng/mL (ref <0.03)

## 2018-12-17 LAB — FOLATE: FOLATE: 31.1 ng/mL (ref >5.9)

## 2018-12-17 LAB — CBC
HCT: 34.2 % — ABNORMAL LOW (ref 39.0–52.0)
Hemoglobin: 10.8 g/dL — ABNORMAL LOW (ref 13.0–17.0)
MCH: 22.9 pg — ABNORMAL LOW (ref 26.0–34.0)
MCHC: 31.6 g/dL (ref 30.0–36.0)
MCV: 72.6 fL — ABNORMAL LOW (ref 80.0–100.0)
Platelets: 323 10*3/uL (ref 150–400)
RBC: 4.71 MIL/uL (ref 4.22–5.81)
RDW: 17.2 % — AB (ref 11.5–15.5)
WBC: 10.7 10*3/uL — ABNORMAL HIGH (ref 4.0–10.5)
nRBC: 0 % (ref 0.0–0.2)

## 2018-12-17 LAB — BASIC METABOLIC PANEL
Anion gap: 7 (ref 5–15)
BUN: 8 mg/dL (ref 6–20)
CO2: 24 mmol/L (ref 22–32)
Calcium: 8.3 mg/dL — ABNORMAL LOW (ref 8.9–10.3)
Chloride: 104 mmol/L (ref 98–111)
Creatinine, Ser: 0.9 mg/dL (ref 0.61–1.24)
GFR calc Af Amer: >60 mL/min (ref >60)
GFR calc non Af Amer: >60 mL/min (ref >60)
Glucose, Bld: 107 mg/dL — ABNORMAL HIGH (ref 70–99)
Potassium: 3.6 mmol/L (ref 3.5–5.1)
Sodium: 135 mmol/L (ref 135–145)

## 2018-12-17 LAB — IRON AND TIBC
Iron: 13 ug/dL — ABNORMAL LOW (ref 45–182)
Saturation Ratios: 3 % — ABNORMAL LOW (ref 17.9–39.5)
TIBC: 456 ug/dL — ABNORMAL HIGH (ref 250–450)
UIBC: 443 ug/dL

## 2018-12-17 LAB — VITAMIN B12: Vitamin B-12: 389 pg/mL (ref 180–914)

## 2018-12-17 LAB — RETICULOCYTES
IMMATURE RETIC FRACT: 27.3 % — AB (ref 2.3–15.9)
RBC.: 4.62 MIL/uL (ref 4.22–5.81)
Retic Count, Absolute: 90.1 10*3/uL (ref 19.0–186.0)
Retic Ct Pct: 2 % (ref 0.4–3.1)

## 2018-12-17 LAB — HEPARIN LEVEL (UNFRACTIONATED): Heparin Unfractionated: <0.1 IU/mL — ABNORMAL LOW (ref 0.30–0.70)

## 2018-12-17 LAB — FERRITIN: Ferritin: 20 ng/mL — ABNORMAL LOW (ref 24–336)

## 2018-12-17 MED ORDER — SODIUM CHLORIDE 0.9% FLUSH
10.0000 mL | Freq: Two times a day (BID) | INTRAVENOUS | Status: DC
  Administered 2018-12-17: 10 mL

## 2018-12-17 MED ORDER — MAGNESIUM SULFATE 4 GM/100ML IV SOLN
4.0000 g | Freq: Once | INTRAVENOUS | Status: AC
  Administered 2018-12-17: 4 g via INTRAVENOUS
  Filled 2018-12-17: qty 100

## 2018-12-17 MED ORDER — CHLORHEXIDINE GLUCONATE CLOTH 2 % EX PADS: 6.0000 | MEDICATED_PAD | Freq: Every day | CUTANEOUS | Status: DC

## 2018-12-17 MED ORDER — MELATONIN 3 MG PO TABS
6.0000 mg | ORAL_TABLET | Freq: Every day | ORAL | Status: DC
  Administered 2018-12-17 – 2018-12-19 (×3): 6 mg via ORAL
  Filled 2018-12-17 (×4): qty 2

## 2018-12-17 MED ORDER — POTASSIUM CHLORIDE CRYS ER 20 MEQ PO TBCR
30.0000 meq | EXTENDED_RELEASE_TABLET | Freq: Once | ORAL | Status: AC
  Administered 2018-12-17: 30 meq via ORAL
  Filled 2018-12-17: qty 1

## 2018-12-17 MED ORDER — BUPROPION HCL ER (SR) 150 MG PO TB12
150.0000 mg | ORAL_TABLET | Freq: Every day | ORAL | Status: AC
  Administered 2018-12-17 – 2018-12-19 (×3): 150 mg via ORAL
  Filled 2018-12-17 (×3): qty 1

## 2018-12-17 MED ORDER — BUPROPION HCL ER (SR) 150 MG PO TB12
150.0000 mg | ORAL_TABLET | Freq: Two times a day (BID) | ORAL | Status: DC
  Administered 2018-12-20: 150 mg via ORAL
  Filled 2018-12-17: qty 1

## 2018-12-17 MED FILL — Heparin Sodium (Porcine) Inj 1000 Unit/ML: INTRAMUSCULAR | Qty: 10 | Status: AC

## 2018-12-17 MED FILL — Perflutren Lipid Microsphere IV Susp 1.1 MG/ML: INTRAVENOUS | Qty: 10 | Status: AC

## 2018-12-17 NOTE — Telephone Encounter (Signed)
Called patient, advised I did not have a DPR on file to give her information. Patient wife will send information over, and have it scanned into the chart.

## 2018-12-17 NOTE — Progress Notes (Signed)
ANTICOAGULATION CONSULT NOTE - Follow-up Consult  Pharmacy Consult for heparin  Indication: chest pain/ACS in setting of IABP  No Known Allergies  Patient Measurements: Height: 5\' 9"  (175.3 cm) Weight: 161 lb 2.5 oz (73.1 kg) IBW/kg (Calculated) : 70.7 Heparin Dosing Weight: 74.8  Vital Signs: Temp: 98.2 F (36.8 C) (02/21 1141) Temp Source: Oral (02/21 1141) BP: 99/74 (02/21 1100) Pulse Rate: 99 (02/21 1100)  Labs: Recent Labs    12/16/18 1020 12/16/18 1505 12/16/18 1840 12/16/18 2318 12/17/18 0237 12/17/18 1000  HGB 12.1*  --   --   --  10.8*  --   HCT 38.7*  --   --   --  34.2*  --   PLT 333  --   --   --  323  --   HEPARINUNFRC  --   --   --   --  <0.10* <0.10*  CREATININE 0.85 0.86  --   --  0.90  --   TROPONINI  --  >65.00* >65.00* >65.00*  --   --     Estimated Creatinine Clearance: 97.1 mL/min (by C-G formula based on SCr of 0.9 mg/dL).  Assessment: 54 yo M with CAD, hx of DES to LAD in July 2019 but has been off P2Y12 therapy due to massive GI bleed and AVMs. Presented with total re-occlusion of LAD requiring thrombectomy, DES to LAD, and IABP placement during cath procedure.  Bivalirudin was continued for 2 hours post-procedure (ended at 6237). Plan for tirofiban to continue for 18 hours (ended 2/21 AM). Given IABP in place, pharmacy consulted to start heparin infusion.   Heparin level undetectable on 1000 units/hr. No issues with line or bleeding reported per RN. Hgb down to 10.8, plt wnl. Plan to continue IABP through 2/21 and re-evaluate tomorrow.  Goal of Therapy:  Heparin goal: 0.2-0.5 units/ml Monitor platelets by anticoagulation protocol: Yes   Plan:  Increase heparin to 1150 units/hr Will f/u 6 hr heparin level Monitor daily HL, CBC, and for s/sx of bleeding  Antonietta Jewel, PharmD, BCCCP Clinical Pharmacist  Pager: [phone] Phone: [phone] **Pharmacist phone directory can now be found on [email or web address] ([email or web address]).  Listed under [place]. 12/17/2018       11:49 AM

## 2018-12-17 NOTE — Progress Notes (Signed)
Progress Note  Patient Name: Howard Thompson Date of Encounter: 12/17/2018  Primary Cardiologist:   Subjective   No recurrent chest pain; IABP in place at 1:1  Inpatient Medications    Scheduled Meds: . aspirin  81 mg Oral Daily  . atorvastatin  80 mg Oral q1800  . folic acid  1 mg Oral Daily  .  HYDROmorphone (DILAUDID) injection  0.5 mg Intravenous Once  . multivitamin with minerals  1 tablet Oral Daily  . nebivolol  2.5 mg Oral QHS  . nitroGLYCERIN  1 inch Topical Once  . pantoprazole  40 mg Oral BID  . QUEtiapine  50 mg Oral QHS  . sodium chloride flush  3 mL Intravenous Q12H  . thiamine  100 mg Oral Daily  . ticagrelor  90 mg Oral BID          Continuous Infusions: . sodium chloride 10 mL/hr at 12/17/18 0700  . sodium chloride    . heparin 1,000 Units/hr (12/17/18 0700)  . sodium chloride 10 mL/hr at 12/16/18 1051  . tirofiban 0.15 mcg/kg/min (12/17/18 0700)   PRN Meds: sodium chloride, acetaminophen, diazepam, nitroGLYCERIN, ondansetron (ZOFRAN) IV, sodium chloride flush, zolpidem   Vital Signs    Vitals:   12/17/18 0500 12/17/18 0600 12/17/18 0700 12/17/18 0807  BP: 92/63 96/65 104/62   Pulse: (!) 193 (!) 175 (!) 200   Resp: (!) 21 18 19    Temp:    98.7 F (37.1 C)  TempSrc:    Oral  SpO2: 97% 98% 98%   Weight: 73.1 kg     Height:        Intake/Output Summary (Last 24 hours) at 12/17/2018 0809 Last data filed at 12/17/2018 0700 Gross per 24 hour  Intake 1039.07 ml  Output 5510 ml  Net -4470.93 ml    I/O since admission:   Filed Weights   12/16/18 1035 12/17/18 0500  Weight: 74.8 kg 73.1 kg    Telemetry    Sinus 98-110 - Personally Reviewed  ECG    12/17/18 ECG (independently read by me): Evolutionary ST changes c/w Anterior STEMI  12/16/2018 ECG (independently read by me): ST at 113 STE V2-6  Physical Exam    BP 104/62   Pulse (!) 200   Temp 98.7 F (37.1 C) (Oral)   Resp 19   Ht 5\' 9"  (1.753 m)   Wt 73.1 kg   SpO2 98%   BMI  23.80 kg/m  General: Alert, oriented, no distress.  Skin: normal turgor, no rashes, warm and dry HEENT: Normocephalic, atraumatic. Pupils equal round and reactive to light; sclera anicteric; extraocular muscles intact;  Nose without nasal septal hypertrophy Mouth/Parynx benign; Mallinpatti scale 3 Neck: No JVD, no carotid bruits; normal carotid upstroke Lungs: clear to ausculatation and percussion; no wheezing or rales Chest wall: without tenderness to palpitation Heart: PMI not displaced, RRR, s1 s2 normal, 1/6 systolic murmur, no diastolic murmur, no rubs, gallops, thrills, or heaves Abdomen: soft, nontender; no hepatosplenomehaly, BS+; abdominal aorta nontender and not dilated by palpation. Back: no CVA tenderness Pulses 2+; R groin IABP sheath stable; R radial site stable Musculoskeletal: full range of motion, normal strength, no joint deformities Extremities: no clubbing cyanosis or edema, Homan's sign negative  Neurologic: grossly nonfocal; Cranial nerves grossly wnl Psychologic: Normal mood and affect   Labs    Chemistry Recent Labs  Lab 12/16/18 1020 12/16/18 1505 12/17/18 0237  NA 129* 129* 135  K 3.4* 3.1* 3.6  CL 96* 94* 104  CO2 20* 21* 24  GLUCOSE 128* 121* 107*  BUN 8 7 8   CREATININE 0.85 0.86 0.90  CALCIUM 8.6* 8.4* 8.3*  PROT  --  6.9 6.6  ALBUMIN  --  3.5 3.0*  AST  --  560* 400*  ALT  --  75* 70*  ALKPHOS  --  91 79  BILITOT  --  1.0 0.8  GFRNONAA >60 >60 >60  GFRAA >60 >60 >60  ANIONGAP 13 14 7      Hematology Recent Labs  Lab 12/16/18 1020 12/17/18 0237  WBC 13.8* 10.7*  RBC 5.18 4.71  HGB 12.1* 10.8*  HCT 38.7* 34.2*  MCV 74.7* 72.6*  MCH 23.4* 22.9*  MCHC 31.3 31.6  RDW 17.2* 17.2*  PLT 333 323    Cardiac Enzymes Recent Labs  Lab 12/16/18 1505 12/16/18 1840 12/16/18 2318  TROPONINI >65.00* >65.00* >65.00*    Recent Labs  Lab 12/16/18 1023  TROPIPOC >30.00*     BNPNo results for input(s): BNP, PROBNP in the last 168  hours.   DDimer No results for input(s): DDIMER in the last 168 hours.   Lipid Panel     Component Value Date/Time   CHOL 144 12/17/2018 0237   TRIG 70 12/17/2018 0237   HDL 40 (L) 12/17/2018 0237   CHOLHDL 3.6 12/17/2018 0237   VLDL 14 12/17/2018 0237   LDLCALC 90 12/17/2018 0237     Radiology    Portable Chest X-ray 1 View  Result Date: 12/16/2018 CLINICAL DATA:  Post recent cardiac stent placement. EXAM: PORTABLE CHEST 1 VIEW COMPARISON:  06/07/2018 and priors. FINDINGS: Postsurgical changes from CABG. Left cardiac stent noted. Probable calcified left hilar lymph nodes. Cardiomediastinal silhouette is normal. Mediastinal contours appear intact. There is no evidence of pneumothorax. Peribronchial airspace opacity in the right lower lobe, not significantly changed from 05/22/2018, the earliest available chest radiograph of this patient. Osseous structures are without acute abnormality. Soft tissues are grossly normal. IMPRESSION: Peribronchial airspace opacity in the right lower lobe, not significantly changed from 05/22/2018. This may represent asymmetric pulmonary edema, airspace consolidation/aspiration, or less likely pulmonary mass. Follow-up with PA and lateral radiograph of the chest, when the patient is well is recommended. Electronically Signed   By: Fidela Salisbury M.D.   On: 12/16/2018 14:40    Cardiac Studies    Ost LAD to Prox LAD lesion is 100% stenosed.  Post intervention, there is a 0% residual stenosis.  A stent was successfully placed.   Acute/Subacute thrombotic occlusion of the previously placed  3.0 x 20 mm Synergy  stent in the proximal LAD with clot extending almost to the LAD ostium with chest pain duration of at least 30 hours.  There was extensive recalcitrant thrombotic burden which persisted despite Angiomax, Aggrastat, multiple runs of Pronto thrombectomy, intracoronary nitroglycerin, intracoronary adenosine, distal infusion of adenosine, with ultimate  complete resolution after insertion of an intra-aortic balloon pump for element of early cardiogenic shock and successful DES stenting of the LAD extending from the ostium to the mid vessel with insertion of a 3.5 x 38 mm Resolute stent postdilated to 3.75 mm with the 100% occlusion being reduced to 0% and after 3-1/2-hour procedure, ultimate reestablishment of brisk TIMI-3 flow antegrade down the LAD system which extends around the LV apex.  Normal left circumflex coronary artery.  Normal dominant RCA with initial faint collateralization of the LAD system via the distal RCA.  LVEDP at the start of the procedure was 30 mmHg.  Aggressive diuresis following  Lasix 40 mg with ultimate 2.8 L of urine output during the procedure.  RECOMMENDATION: Continue Aggrastat for minimum of 18 hours post procedure.  Bivalirudin will be discontinued 2 hours post procedure.  We will plan to keep the intra-aortic balloon pump one-to-one today.  Obtain follow-up 2D echo Doppler study.  Recommend long-term dual antiplatelet therapy as long as there is no recurrent GI bleeds.  Will assess drug screen with remote history of cocaine use.  Smoking cessation is essential.  Aggressive lipid-lowering therapy with target LDL less than 70.    Intervention         Patient Profile     Mr. Howard Thompson is a 53 year old gentleman who had previously suffered an anterior STEMI complicated by VF arrest in July 2019 and underwent successful defibrillation with ultimate stenting of his proximal LAD. A 3.0 x 20 mm Synergy DES stent was inserted. He initially had EF 25% with subsequently improved several months later to 45%. Patient has a history of ongoing tobacco use. There also has a history of cocaine use. He had developed a GI bleed ultimately leading to discontinuance of his dual antiplatelet therapy. He presented to the emergency room 12/16/18 after 30 hours of recurrent chest tightness. ECG shows anterolateral ST segment  elevation myocardial infarction. A code STEMI was activated and he was brought emergently to the catheterization laboratory.  Assessment & Plan    1. Anterior STEMI:  Initially 04/2018;  Pt was initially on Brilinta following his initial tear MI complicated by VF cardiac arrest in July 2020.  He subsequently developed GI bleed with hemoglobin dropped for leading to initial discontinuance and later transition to Plavix.   He states he never had cardiology follow-up with [place] but had seen a Dr. Valaria Good in [place] in September on 2 occasions for office follow-up.  He has not seen him since.  Apparently the patient self discontinued Plavix beginning of the new year.  The day prior to presenting to the hospital yesterday he had experienced sense of uneasiness and did not feel well all day 8 discomfort which ultimately intensified yesterday leading to his presentation and recurrent anterior STEMI.  There is now total near ostial occlusion of the LAD with extensive thrombus burden.  Please see catheterization details.  Ultimately after 3 and half hour procedure he left the catheterization laboratory with a widely patent LAD brisk TIMI-3 flow and a new stent placed from the LAD ostium to the mid LAD 3.5 x 38 mm postdilated to 3.75 mm.  Troponin is greater than 65  2. Cardiogenic shock.  Augmented blood pressure this morning with one-to-one balloon pumping was 161 systolic.  I have transition down to 1-2.  BP augmented is approximately 100.  Resting heart rate is approximately 100 bpm.  Ultimately plan to initiate very low-dose metoprolol succinate at 12.5 mg initially once he is off the balloon pump.  Plan to continue the balloon pump today.  Awaiting results of the echo Doppler study.  Anticipate significant drop in his EF but there is potential for salvageability and recovery.  Ultimately once he is off balloon pump, will initiate ARB therapy with consideration for possible Entresto in the future if LV  remains low.  3.  Anemia: Previously documented angiodysplasia.  Will recheck iron studies.  MCV is microcytic; Hb 10.8, hematocrit 34.2.  Check stool guaiacs.  Start iron replacement if low.  4.  Markedly elevated liver function studies.  Suspect probably contributed by MRI/transient shock.  Will hold atorvastatin.  5.  Cocaine: Patient denies recent cocaine use.  However, urinary screen is positive/  6. Tobacco use.  Long discussion with need for complete smoking cessation.  7: Prior acute kidney injury requiring transient dialysis in July 2020.  Renal function so far stable post-cath with creatinine 0.90 today.   8.  Magnesium 1.4.  Will replete with 4 g of mag sulfate.  Potassium this morning 3.6 repleted to greater than 4.0.  Critical care Time spent: 60 minutes  Signed, Troy Sine, MD, [facility] 12/17/2018, 8:09 AM

## 2018-12-17 NOTE — Progress Notes (Signed)
ANTICOAGULATION CONSULT NOTE - Follow-up Consult  Pharmacy Consult for heparin  Indication: chest pain/ACS in setting of IABP  No Known Allergies  Patient Measurements: Height: 5\' 9"  (175.3 cm) Weight: 161 lb 2.5 oz (73.1 kg) IBW/kg (Calculated) : 70.7 Heparin Dosing Weight: 74.8  Vital Signs: Temp: 98.6 F (37 C) (02/21 2000) Temp Source: Oral (02/21 2000) BP: 109/66 (02/21 2100) Pulse Rate: 121 (02/21 2100)  Labs: Recent Labs    12/16/18 1020 12/16/18 1505 12/16/18 1840 12/16/18 2318 12/17/18 0237 12/17/18 1000 12/17/18 1811  HGB 12.1*  --   --   --  10.8*  --   --   HCT 38.7*  --   --   --  34.2*  --   --   PLT 333  --   --   --  323  --   --   HEPARINUNFRC  --   --   --   --  <0.10* <0.10* <0.10*  CREATININE 0.85 0.86  --   --  0.90  --   --   TROPONINI  --  >65.00* >65.00* >65.00*  --   --   --     Estimated Creatinine Clearance: 97.1 mL/min (by C-G formula based on SCr of 0.9 mg/dL).  Assessment: 53 yo M with CAD, hx of DES to LAD in July 2019 but has been off P2Y12 therapy due to massive GI bleed and AVMs. Presented with total re-occlusion of LAD requiring thrombectomy, DES to LAD, and IABP placement during cath procedure.  Bivalirudin was continued for 2 hours post-procedure (ended at 3151). Plan for tirofiban to continue for 18 hours (ended 2/21 AM). Given IABP in place, pharmacy consulted to start heparin infusion.   Heparin level continues to be  undetectable despite heparin drip rate increased now at 1150 units/hr. No issues with line or bleeding reported per RN. Hgb down to 10.8, plt wnl. Plan to continue IABP through 2/21 and re-evaluate tomorrow.  Goal of Therapy:  Heparin goal: 0.2-0.5 units/ml Monitor platelets by anticoagulation protocol: Yes   Plan:  Increase heparin to 1350 units/hr Monitor daily HL, CBC, and for s/sx of bleeding  Bonnita Nasuti Pharm.D. CPP, BCPS Clinical Pharmacist [phone] 12/17/2018 9:09 PM

## 2018-12-17 NOTE — Progress Notes (Signed)
ANTICOAGULATION CONSULT NOTE - Follow-up Consult  Pharmacy Consult for heparin and tirofiban Indication: chest pain/ACS in setting of IABP  No Known Allergies  Patient Measurements: Height: 5\' 9"  (175.3 cm) Weight: 165 lb (74.8 kg) IBW/kg (Calculated) : 70.7 Heparin Dosing Weight: 74.8  Vital Signs: Temp: 98.6 F (37 C) (02/21 0000) Temp Source: Oral (02/21 0000) BP: 107/65 (02/21 0300) Pulse Rate: 211 (02/21 0300)  Labs: Recent Labs    12/16/18 1020 12/16/18 1505 12/16/18 1840 12/16/18 2318 12/17/18 0237  HGB 12.1*  --   --   --  10.8*  HCT 38.7*  --   --   --  34.2*  PLT 333  --   --   --  323  HEPARINUNFRC  --   --   --   --  <0.10*  CREATININE 0.85 0.86  --   --   --   TROPONINI  --  >65.00* >65.00* >65.00*  --     Estimated Creatinine Clearance: 101.6 mL/min (by C-G formula based on SCr of 0.86 mg/dL).  Assessment: 53 yo M with CAD, hx of DES to LAD in July 2019 but has been off P2Y12 therapy due to massive GI bleed and AVMs. Presented with total re-occlusion of LAD requiring thrombectomy, DES to LAD, and IABP placement during cath procedure.  Bivalirudin was continued for 2 hours post-procedure (ended at 8016). Plan for tirofiban to continue for 18 hours. Given IABP in place, pharmacy consulted to start heparin infusion.   Heparin level undetectable on 750 units/hr. No issues with line or bleeding reported per RN. Hgb down to 10.8, plt wnl.  Goal of Therapy:  Heparin goal: 0.2-0.5 units/ml Monitor platelets by anticoagulation protocol: Yes   Plan:  Increase heparin to 1000 units/hr Tirofiban 0.15 mcg/kg/min for at least 18 hours, MD to reassess this AM Will f/u 6 hr heparin level  Sherlon Handing, PharmD, BCPS Clinical pharmacist  **Pharmacist phone directory can now be found on [email or web address] ([email or web address]).  Listed under [place]. 12/17/2018       3:37 AM

## 2018-12-17 NOTE — Telephone Encounter (Signed)
New Message   Patient's wife calling to find out if labs were done on her husband and if so she want's to know the resuts.

## 2018-12-17 NOTE — Progress Notes (Signed)
EKG CRITICAL VALUE     12 lead EKG performed.  Critical value noted.  Verdia Kuba, RN notified.   Radene Gunning, CCT 12/17/2018 7:33 AM

## 2018-12-18 ENCOUNTER — Inpatient Hospital Stay (HOSPITAL_COMMUNITY): Payer: 59

## 2018-12-18 LAB — COMPREHENSIVE METABOLIC PANEL
ALT: 42 U/L (ref 0–44)
AST: 129 U/L — ABNORMAL HIGH (ref 15–41)
Albumin: 2.8 g/dL — ABNORMAL LOW (ref 3.5–5.0)
Alkaline Phosphatase: 62 U/L (ref 38–126)
Anion gap: 8 (ref 5–15)
BILIRUBIN TOTAL: 0.7 mg/dL (ref 0.3–1.2)
BUN: 13 mg/dL (ref 6–20)
CO2: 23 mmol/L (ref 22–32)
Calcium: 8 mg/dL — ABNORMAL LOW (ref 8.9–10.3)
Chloride: 106 mmol/L (ref 98–111)
Creatinine, Ser: 0.86 mg/dL (ref 0.61–1.24)
GFR calc Af Amer: >60 mL/min (ref >60)
GFR calc non Af Amer: >60 mL/min (ref >60)
Glucose, Bld: 98 mg/dL (ref 70–99)
POTASSIUM: 3.6 mmol/L (ref 3.5–5.1)
Sodium: 137 mmol/L (ref 135–145)
Total Protein: 6.2 g/dL — ABNORMAL LOW (ref 6.5–8.1)

## 2018-12-18 LAB — CBC
HEMATOCRIT: 32.2 % — AB (ref 39.0–52.0)
Hemoglobin: 9.5 g/dL — ABNORMAL LOW (ref 13.0–17.0)
MCH: 22.4 pg — ABNORMAL LOW (ref 26.0–34.0)
MCHC: 29.5 g/dL — ABNORMAL LOW (ref 30.0–36.0)
MCV: 75.8 fL — ABNORMAL LOW (ref 80.0–100.0)
Platelets: 262 10*3/uL (ref 150–400)
RBC: 4.25 MIL/uL (ref 4.22–5.81)
RDW: 17.6 % — ABNORMAL HIGH (ref 11.5–15.5)
WBC: 7.8 10*3/uL (ref 4.0–10.5)
nRBC: 0 % (ref 0.0–0.2)

## 2018-12-18 LAB — MAGNESIUM: Magnesium: 2.3 mg/dL (ref 1.7–2.4)

## 2018-12-18 LAB — HEPARIN LEVEL (UNFRACTIONATED): Heparin Unfractionated: 0.18 IU/mL — ABNORMAL LOW (ref 0.30–0.70)

## 2018-12-18 LAB — POCT ACTIVATED CLOTTING TIME: Activated Clotting Time: 120 seconds

## 2018-12-18 MED ORDER — CARVEDILOL 3.125 MG PO TABS
3.1250 mg | ORAL_TABLET | Freq: Two times a day (BID) | ORAL | Status: DC
  Administered 2018-12-18 – 2018-12-20 (×4): 3.125 mg via ORAL
  Filled 2018-12-18 (×4): qty 1

## 2018-12-18 MED ORDER — SPIRONOLACTONE 12.5 MG HALF TABLET
12.5000 mg | ORAL_TABLET | Freq: Every day | ORAL | Status: DC
  Administered 2018-12-18 – 2018-12-20 (×3): 12.5 mg via ORAL
  Filled 2018-12-18 (×3): qty 1

## 2018-12-18 MED ORDER — SODIUM CHLORIDE 0.9 % IV SOLN
510.0000 mg | INTRAVENOUS | Status: DC
  Administered 2018-12-18: 510 mg via INTRAVENOUS
  Filled 2018-12-18: qty 17

## 2018-12-18 MED ORDER — POTASSIUM CHLORIDE CRYS ER 20 MEQ PO TBCR
30.0000 meq | EXTENDED_RELEASE_TABLET | Freq: Once | ORAL | Status: AC
  Administered 2018-12-18: 30 meq via ORAL
  Filled 2018-12-18: qty 1

## 2018-12-18 MED ORDER — ENOXAPARIN SODIUM 40 MG/0.4ML ~~LOC~~ SOLN
40.0000 mg | SUBCUTANEOUS | Status: DC
  Administered 2018-12-18 – 2018-12-19 (×2): 40 mg via SUBCUTANEOUS
  Filled 2018-12-18 (×2): qty 0.4

## 2018-12-18 MED ORDER — POTASSIUM CHLORIDE CRYS ER 20 MEQ PO TBCR
40.0000 meq | EXTENDED_RELEASE_TABLET | Freq: Two times a day (BID) | ORAL | Status: DC
  Administered 2018-12-18 – 2018-12-20 (×4): 40 meq via ORAL
  Filled 2018-12-18 (×4): qty 2

## 2018-12-18 MED ORDER — ATORVASTATIN CALCIUM 80 MG PO TABS
80.0000 mg | ORAL_TABLET | Freq: Every day | ORAL | Status: DC
  Administered 2018-12-18 – 2018-12-19 (×2): 80 mg via ORAL
  Filled 2018-12-18 (×2): qty 1

## 2018-12-18 NOTE — Progress Notes (Signed)
Removal of right femoral IABP without complications. Manual pressure applied for 40 minutes. Pulse right DP palpable +2. Site stable, no hematoma, no bruising or oozing post removal. Sterile 4x4 and tegaderm applied over site. Vitals BP 106/70, NSR 97, Sat 100% room air. Patient given post instructions and was able to verbally repeat.

## 2018-12-18 NOTE — Progress Notes (Signed)
ANTICOAGULATION CONSULT NOTE - Follow-up Consult  Pharmacy Consult for heparin  Indication: chest pain/ACS in setting of IABP  No Known Allergies  Patient Measurements: Height: 5\' 9"  (175.3 cm) Weight: 161 lb 2.5 oz (73.1 kg) IBW/kg (Calculated) : 70.7 Heparin Dosing Weight: 74.8  Vital Signs: Temp: 98.2 F (36.8 C) (02/22 0000) Temp Source: Oral (02/22 0000) BP: 79/69 (02/22 0400) Pulse Rate: 109 (02/22 0400)  Labs: Recent Labs    12/16/18 1020 12/16/18 1505 12/16/18 1840 12/16/18 2318  12/17/18 0237 12/17/18 1000 12/17/18 1811 12/18/18 0239  HGB 12.1*  --   --   --   --  10.8*  --   --  9.5*  HCT 38.7*  --   --   --   --  34.2*  --   --  32.2*  PLT 333  --   --   --   --  323  --   --  262  HEPARINUNFRC  --   --   --   --    < > <0.10* <0.10* <0.10* 0.18*  CREATININE 0.85 0.86  --   --   --  0.90  --   --   --   TROPONINI  --  >65.00* >65.00* >65.00*  --   --   --   --   --    < > = values in this interval not displayed.    Estimated Creatinine Clearance: 97.1 mL/min (by C-G formula based on SCr of 0.9 mg/dL).  Assessment: 53 yo M with CAD, hx of DES to LAD in July 2019 but has been off P2Y12 therapy due to massive GI bleed and AVMs. Presented with total re-occlusion of LAD requiring thrombectomy, DES to LAD, and IABP placement during cath procedure.  Bivalirudin was continued for 2 hours post-procedure (ended at 5537). Plan for tirofiban to continue for 18 hours (ended 2/21 AM). Given IABP in place, pharmacy consulted to start heparin infusion.   Heparin level this am 0.18 units/ml  Plan to continue IABP through 2/21 and re-evaluate tomorrow.  Goal of Therapy:  Heparin goal: 0.2-0.5 units/ml Monitor platelets by anticoagulation protocol: Yes   Plan:  Increase heparin to 1400 units/hr Monitor daily HL, CBC, and for s/sx of bleeding F/u plan for IABP  Thanks for allowing pharmacy to be a part of this patient's care.  Excell Seltzer, PharmD Clinical  Pharmacist 12/18/2018 4:50 AM

## 2018-12-18 NOTE — Progress Notes (Signed)
Progress Note  Patient Name: Howard Thompson Date of Encounter: 12/18/2018  Primary Cardiologist:   Subjective   Remains on IABP 1:2. Denies CP or SOB. Has finished aggrastat infusion. No bleeding.  Echo EF 25-30%   Inpatient Medications    Scheduled Meds: . aspirin  81 mg Oral Daily  . buPROPion  150 mg Oral Daily   Followed by  . [START ON 12/20/2018] buPROPion  150 mg Oral BID  . Chlorhexidine Gluconate Cloth  6 each Topical Daily  . folic acid  1 mg Oral Daily  .  HYDROmorphone (DILAUDID) injection  0.5 mg Intravenous Once  . Melatonin  6 mg Oral QHS  . multivitamin with minerals  1 tablet Oral Daily  . nitroGLYCERIN  1 inch Topical Once  . pantoprazole  40 mg Oral BID  . potassium chloride  30 mEq Oral Once  . potassium chloride  40 mEq Oral BID  . sodium chloride flush  10-40 mL Intracatheter Q12H  . sodium chloride flush  3 mL Intravenous Q12H  . spironolactone  12.5 mg Oral Daily  . thiamine  100 mg Oral Daily  . ticagrelor  90 mg Oral BID          Continuous Infusions: . sodium chloride 10 mL/hr at 12/18/18 0900  . sodium chloride    . heparin 1,400 Units/hr (12/18/18 0900)  . sodium chloride 10 mL/hr at 12/16/18 1051   PRN Meds: sodium chloride, acetaminophen, diazepam, nitroGLYCERIN, ondansetron (ZOFRAN) IV, sodium chloride flush, zolpidem   Vital Signs    Vitals:   12/18/18 0700 12/18/18 0750 12/18/18 0800 12/18/18 0900  BP: 96/62  102/62 112/82  Pulse: 93  97 95  Resp: 16  14 (!) 22  Temp:  98.9 F (37.2 C)    TempSrc:  Oral    SpO2: 100%  99% 96%  Weight:      Height:        Intake/Output Summary (Last 24 hours) at 12/18/2018 0957 Last data filed at 12/18/2018 0900 Gross per 24 hour  Intake 1027.25 ml  Output 1600 ml  Net -572.75 ml    I/O since admission:   Filed Weights   12/16/18 1035 12/17/18 0500 12/18/18 0500  Weight: 74.8 kg 73.1 kg 76.2 kg    Telemetry    Sinus 90-100 Personally reviewed   ECG    12/17/18 ECG  (independently read by me): Evolutionary ST changes c/w Anterior STEMI  12/16/2018 ECG (independently read by me): ST at 113 STE V2-6  Physical Exam    BP 112/82   Pulse 95   Temp 98.9 F (37.2 C) (Oral)   Resp (!) 22   Ht 5\' 9"  (1.753 m)   Wt 76.2 kg   SpO2 96%   BMI 24.81 kg/m  General:  Well appearing. No resp difficulty HEENT: normal Neck: supple. no JVD. Carotids 2+ bilat; no bruits. No lymphadenopathy or thryomegaly appreciated. Cor: PMI nondisplaced. Regular rate & rhythm. 2/6 MR Lungs: clear Abdomen: soft, nontender, nondistended. No hepatosplenomegaly. No bruits or masses. Good bowel sounds. Extremities: no cyanosis, clubbing, rash, edema RFA IABP ok  Neuro: alert & orientedx3, cranial nerves grossly intact. moves all 4 extremities w/o difficulty. Affect pleasant   Labs    Chemistry Recent Labs  Lab 12/16/18 1505 12/17/18 0237 12/18/18 0239  NA 129* 135 137  K 3.1* 3.6 3.6  CL 94* 104 106  CO2 21* 24 23  GLUCOSE 121* 107* 98  BUN 7 8 13   CREATININE  0.86 0.90 0.86  CALCIUM 8.4* 8.3* 8.0*  PROT 6.9 6.6 6.2*  ALBUMIN 3.5 3.0* 2.8*  AST 560* 400* 129*  ALT 75* 70* 42  ALKPHOS 91 79 62  BILITOT 1.0 0.8 0.7  GFRNONAA >60 >60 >60  GFRAA >60 >60 >60  ANIONGAP 14 7 8      Hematology Recent Labs  Lab 12/16/18 1020 12/17/18 0237 12/17/18 1000 12/18/18 0239  WBC 13.8* 10.7*  --  7.8  RBC 5.18 4.71 4.62 4.25  HGB 12.1* 10.8*  --  9.5*  HCT 38.7* 34.2*  --  32.2*  MCV 74.7* 72.6*  --  75.8*  MCH 23.4* 22.9*  --  22.4*  MCHC 31.3 31.6  --  29.5*  RDW 17.2* 17.2*  --  17.6*  PLT 333 323  --  262    Cardiac Enzymes Recent Labs  Lab 12/16/18 1505 12/16/18 1840 12/16/18 2318  TROPONINI >65.00* >65.00* >65.00*    Recent Labs  Lab 12/16/18 1023  TROPIPOC >30.00*     BNPNo results for input(s): BNP, PROBNP in the last 168 hours.   DDimer No results for input(s): DDIMER in the last 168 hours.   Lipid Panel     Component Value Date/Time    CHOL 144 12/17/2018 0237   TRIG 70 12/17/2018 0237   HDL 40 (L) 12/17/2018 0237   CHOLHDL 3.6 12/17/2018 0237   VLDL 14 12/17/2018 0237   LDLCALC 90 12/17/2018 0237     Radiology    Portable Chest X-ray 1 View  Result Date: 12/16/2018 CLINICAL DATA:  Post recent cardiac stent placement. EXAM: PORTABLE CHEST 1 VIEW COMPARISON:  06/07/2018 and priors. FINDINGS: Postsurgical changes from CABG. Left cardiac stent noted. Probable calcified left hilar lymph nodes. Cardiomediastinal silhouette is normal. Mediastinal contours appear intact. There is no evidence of pneumothorax. Peribronchial airspace opacity in the right lower lobe, not significantly changed from 05/22/2018, the earliest available chest radiograph of this patient. Osseous structures are without acute abnormality. Soft tissues are grossly normal. IMPRESSION: Peribronchial airspace opacity in the right lower lobe, not significantly changed from 05/22/2018. This may represent asymmetric pulmonary edema, airspace consolidation/aspiration, or less likely pulmonary mass. Follow-up with PA and lateral radiograph of the chest, when the patient is well is recommended. Electronically Signed   By: Fidela Salisbury M.D.   On: 12/16/2018 14:40    Cardiac Studies    Ost LAD to Prox LAD lesion is 100% stenosed.  Post intervention, there is a 0% residual stenosis.  A stent was successfully placed.   Acute/Subacute thrombotic occlusion of the previously placed  3.0 x 20 mm Synergy  stent in the proximal LAD with clot extending almost to the LAD ostium with chest pain duration of at least 30 hours.  There was extensive recalcitrant thrombotic burden which persisted despite Angiomax, Aggrastat, multiple runs of Pronto thrombectomy, intracoronary nitroglycerin, intracoronary adenosine, distal infusion of adenosine, with ultimate complete resolution after insertion of an intra-aortic balloon pump for element of early cardiogenic shock and successful  DES stenting of the LAD extending from the ostium to the mid vessel with insertion of a 3.5 x 38 mm Resolute stent postdilated to 3.75 mm with the 100% occlusion being reduced to 0% and after 3-1/2-hour procedure, ultimate reestablishment of brisk TIMI-3 flow antegrade down the LAD system which extends around the LV apex.  Normal left circumflex coronary artery.  Normal dominant RCA with initial faint collateralization of the LAD system via the distal RCA.  LVEDP at the start  of the procedure was 30 mmHg.  Aggressive diuresis following Lasix 40 mg with ultimate 2.8 L of urine output during the procedure.  RECOMMENDATION: Continue Aggrastat for minimum of 18 hours post procedure.  Bivalirudin will be discontinued 2 hours post procedure.  We will plan to keep the intra-aortic balloon pump one-to-one today.  Obtain follow-up 2D echo Doppler study.  Recommend long-term dual antiplatelet therapy as long as there is no recurrent GI bleeds.  Will assess drug screen with remote history of cocaine use.  Smoking cessation is essential.  Aggressive lipid-lowering therapy with target LDL less than 70.    Intervention         Patient Profile     Mr. Howard Thompson is a 53 year old gentleman who had previously suffered an anterior STEMI complicated by VF arrest in July 2019 and underwent successful defibrillation with ultimate stenting of his proximal LAD. A 3.0 x 20 mm Synergy DES stent was inserted. He initially had EF 25% with subsequently improved several months later to 45%. Patient has a history of ongoing tobacco use. There also has a history of cocaine use. He had developed a GI bleed ultimately leading to discontinuance of his dual antiplatelet therapy. He presented to the emergency room 12/16/18 after 30 hours of recurrent chest tightness. ECG shows anterolateral ST segment elevation myocardial infarction. A code STEMI was activated and he was brought emergently to the catheterization  laboratory.  Assessment & Plan    1. Anterior STEMI:  - due to acute stent thrombosis after stopping Plavix (initial infarct 7/19) - emergent cath 2/20 with DES to LAD 3.5 x 38 mm postdilated to 3.75 mm. Treated with aggrastat for heavy clot burden - troponin > 65 - echo EF 25-30% on 2/20 - Continue ASA, Brilinta. Restart statin. Lifelong DAPT with stent thrombosis.  - Start low dose carvedilol - Start spiro 12.5 daily - Add ARB as BP tolerates - CR to see  2. Cardiogenic shock. - echo EF 25-30% on 2/20 - No overt HF today. Volume ok. - Wean IABP today - Med changes as above  3. Hypokalemia - supp. Add spiro.   4.  Iron-deficiency Anemia: Previously documented angiodysplasia.   - Hgb 9.5 - Iron stores low. Will give feraheme. D/w PharmD   - F/u GI as outpatient   5.  Markedly elevated liver function studies - due to [place]. Now improving. Restart atorva  6.  Cocaine: Patient denies recent cocaine use.  However, urinary screen is positive  7. Tobacco use.   - Reinforced need for smoking cessation   7: Prior acute kidney injury requiring transient dialysis in July 2020.  Renal function so far stable post-cath with creatinine 0.90==86 today.   8.  Hypomagnesemia  - supped. Mag 2.3 today  Glori Bickers, MD  10:05 AM

## 2018-12-19 ENCOUNTER — Other Ambulatory Visit: Payer: Self-pay

## 2018-12-19 ENCOUNTER — Encounter (HOSPITAL_COMMUNITY): Payer: Self-pay | Admitting: *Deleted

## 2018-12-19 LAB — CBC
HCT: 31.7 % — ABNORMAL LOW (ref 39.0–52.0)
Hemoglobin: 9.4 g/dL — ABNORMAL LOW (ref 13.0–17.0)
MCH: 22.5 pg — ABNORMAL LOW (ref 26.0–34.0)
MCHC: 29.7 g/dL — ABNORMAL LOW (ref 30.0–36.0)
MCV: 76 fL — ABNORMAL LOW (ref 80.0–100.0)
Platelets: 298 10*3/uL (ref 150–400)
RBC: 4.17 MIL/uL — AB (ref 4.22–5.81)
RDW: 17.7 % — ABNORMAL HIGH (ref 11.5–15.5)
WBC: 7.5 10*3/uL (ref 4.0–10.5)
nRBC: 0 % (ref 0.0–0.2)

## 2018-12-19 LAB — COMPREHENSIVE METABOLIC PANEL
ALT: 31 U/L (ref 0–44)
AST: 57 U/L — ABNORMAL HIGH (ref 15–41)
Albumin: 2.9 g/dL — ABNORMAL LOW (ref 3.5–5.0)
Alkaline Phosphatase: 64 U/L (ref 38–126)
Anion gap: 8 (ref 5–15)
BILIRUBIN TOTAL: 0.8 mg/dL (ref 0.3–1.2)
BUN: 11 mg/dL (ref 6–20)
CO2: 20 mmol/L — ABNORMAL LOW (ref 22–32)
CREATININE: 0.86 mg/dL (ref 0.61–1.24)
Calcium: 8.5 mg/dL — ABNORMAL LOW (ref 8.9–10.3)
Chloride: 108 mmol/L (ref 98–111)
GFR calc Af Amer: >60 mL/min (ref >60)
GFR calc non Af Amer: >60 mL/min (ref >60)
Glucose, Bld: 97 mg/dL (ref 70–99)
Potassium: 4.2 mmol/L (ref 3.5–5.1)
Sodium: 136 mmol/L (ref 135–145)
Total Protein: 6.4 g/dL — ABNORMAL LOW (ref 6.5–8.1)

## 2018-12-19 MED ORDER — LOSARTAN POTASSIUM 25 MG PO TABS
25.0000 mg | ORAL_TABLET | Freq: Every day | ORAL | Status: DC
  Administered 2018-12-20: 25 mg via ORAL
  Filled 2018-12-19: qty 1

## 2018-12-19 NOTE — Progress Notes (Signed)
Pt received from 2H. VSS. Telemetry applied. CHG complete. Pt oriented to room and unit. Will continue to monitor.  Terasa Orsini, RN  

## 2018-12-19 NOTE — Progress Notes (Addendum)
Progress Note  Patient Name: Howard Thompson Date of Encounter: 12/19/2018  Primary Cardiologist:   Subjective   IABP pulled yesterday. Denies CP or SOB. SBP improved.   Echo EF 25-30%   Inpatient Medications    Scheduled Meds: . aspirin  81 mg Oral Daily  . atorvastatin  80 mg Oral q1800  . [START ON 12/20/2018] buPROPion  150 mg Oral BID  . carvedilol  3.125 mg Oral BID WC  . Chlorhexidine Gluconate Cloth  6 each Topical Daily  . enoxaparin (LOVENOX) injection  40 mg Subcutaneous Q24H  . folic acid  1 mg Oral Daily  .  HYDROmorphone (DILAUDID) injection  0.5 mg Intravenous Once  . Melatonin  6 mg Oral QHS  . multivitamin with minerals  1 tablet Oral Daily  . nitroGLYCERIN  1 inch Topical Once  . pantoprazole  40 mg Oral BID  . potassium chloride  40 mEq Oral BID  . sodium chloride flush  10-40 mL Intracatheter Q12H  . sodium chloride flush  3 mL Intravenous Q12H  . spironolactone  12.5 mg Oral Daily  . thiamine  100 mg Oral Daily  . ticagrelor  90 mg Oral BID          Continuous Infusions: . sodium chloride 10 mL/hr at 12/18/18 2000  . sodium chloride    . ferumoxytol Stopped (12/18/18 1153)  . sodium chloride 10 mL/hr at 12/16/18 1051   PRN Meds: sodium chloride, acetaminophen, diazepam, nitroGLYCERIN, ondansetron (ZOFRAN) IV, sodium chloride flush, zolpidem   Vital Signs    Vitals:   12/19/18 0300 12/19/18 0400 12/19/18 0742 12/19/18 0810  BP: 112/74 112/75  117/74  Pulse: 98 90  (!) 104  Resp: 17 17  15   Temp: 98 F (36.7 C) 98 F (36.7 C) 99 F (37.2 C)   TempSrc: Oral Oral Oral   SpO2: 100% 98%  99%  Weight:      Height:        Intake/Output Summary (Last 24 hours) at 12/19/2018 0934 Last data filed at 12/19/2018 0600 Gross per 24 hour  Intake 929.91 ml  Output 1600 ml  Net -670.09 ml    I/O since admission:   Filed Weights   12/16/18 1035 12/17/18 0500 12/18/18 0500  Weight: 74.8 kg 73.1 kg 76.2 kg    Telemetry    Sinus 90-100  Personally reviewed   ECG    12/17/18 ECG (independently read by me): Evolutionary ST changes c/w Anterior STEMI  12/16/2018 ECG (independently read by me): ST at 113 STE V2-6  Physical Exam    BP 117/74   Pulse (!) 104   Temp 99 F (37.2 C) (Oral)   Resp 15   Ht 5\' 9"  (1.753 m)   Wt 76.2 kg   SpO2 99%   BMI 24.81 kg/m  General:  Well appearing. No resp difficulty HEENT: normal Neck: supple. no JVD. Carotids 2+ bilat; no bruits. No lymphadenopathy or thryomegaly appreciated. Cor: PMI nondisplaced. Regular rate & rhythm. No rubs, gallops or murmurs. Lungs: clear Abdomen: soft, nontender, nondistended. No hepatosplenomegaly. No bruits or masses. Good bowel sounds. Extremities: no cyanosis, clubbing, rash, edema RFA site ok no bruit  Neuro: alert & orientedx3, cranial nerves grossly intact. moves all 4 extremities w/o difficulty. Affect pleasant    Labs    Chemistry Recent Labs  Lab 12/17/18 0237 12/18/18 0239 12/19/18 0312  NA 135 137 136  K 3.6 3.6 4.2  CL 104 106 108  CO2 24 23  20*  GLUCOSE 107* 98 97  BUN 8 13 11   CREATININE 0.90 0.86 0.86  CALCIUM 8.3* 8.0* 8.5*  PROT 6.6 6.2* 6.4*  ALBUMIN 3.0* 2.8* 2.9*  AST 400* 129* 57*  ALT 70* 42 31  ALKPHOS 79 62 64  BILITOT 0.8 0.7 0.8  GFRNONAA >60 >60 >60  GFRAA >60 >60 >60  ANIONGAP 7 8 8      Hematology Recent Labs  Lab 12/17/18 0237 12/17/18 1000 12/18/18 0239 12/19/18 0312  WBC 10.7*  --  7.8 7.5  RBC 4.71 4.62 4.25 4.17*  HGB 10.8*  --  9.5* 9.4*  HCT 34.2*  --  32.2* 31.7*  MCV 72.6*  --  75.8* 76.0*  MCH 22.9*  --  22.4* 22.5*  MCHC 31.6  --  29.5* 29.7*  RDW 17.2*  --  17.6* 17.7*  PLT 323  --  262 298    Cardiac Enzymes Recent Labs  Lab 12/16/18 1505 12/16/18 1840 12/16/18 2318  TROPONINI >65.00* >65.00* >65.00*    Recent Labs  Lab 12/16/18 1023  TROPIPOC >30.00*     BNPNo results for input(s): BNP, PROBNP in the last 168 hours.   DDimer No results for input(s): DDIMER in  the last 168 hours.   Lipid Panel     Component Value Date/Time   CHOL 144 12/17/2018 0237   TRIG 70 12/17/2018 0237   HDL 40 (L) 12/17/2018 0237   CHOLHDL 3.6 12/17/2018 0237   VLDL 14 12/17/2018 0237   LDLCALC 90 12/17/2018 0237     Radiology    Dg Chest 1 View  Result Date: 12/18/2018 CLINICAL DATA:  Central line placement. EXAM: CHEST  1 VIEW COMPARISON:  12/16/2018 FINDINGS: Postsurgical changes from CABG. No central line is seen. The radiopaque tip of the intra-aortic balloon pump overlies the aortic arch, which is usually considered too high. Cardiomediastinal silhouette is normal. Mediastinal contours appear intact. Probable calcified lymph nodes in the left hilum. There is no evidence of focal airspace consolidation, pleural effusion or pneumothorax. Osseous structures are without acute abnormality. Soft tissues are grossly normal. IMPRESSION: 1. The radiopaque tip of the intra-aortic balloon pump overlies the aortic arch, which is usually considered too high. 2. No central line is seen. 3. No evidence of pneumothorax. Electronically Signed   By: Fidela Salisbury M.D.   On: 12/18/2018 10:22    Cardiac Studies    Ost LAD to Prox LAD lesion is 100% stenosed.  Post intervention, there is a 0% residual stenosis.  A stent was successfully placed.   Acute/Subacute thrombotic occlusion of the previously placed  3.0 x 20 mm Synergy  stent in the proximal LAD with clot extending almost to the LAD ostium with chest pain duration of at least 30 hours.  There was extensive recalcitrant thrombotic burden which persisted despite Angiomax, Aggrastat, multiple runs of Pronto thrombectomy, intracoronary nitroglycerin, intracoronary adenosine, distal infusion of adenosine, with ultimate complete resolution after insertion of an intra-aortic balloon pump for element of early cardiogenic shock and successful DES stenting of the LAD extending from the ostium to the mid vessel with insertion of a  3.5 x 38 mm Resolute stent postdilated to 3.75 mm with the 100% occlusion being reduced to 0% and after 3-1/2-hour procedure, ultimate reestablishment of brisk TIMI-3 flow antegrade down the LAD system which extends around the LV apex.  Normal left circumflex coronary artery.  Normal dominant RCA with initial faint collateralization of the LAD system via the distal RCA.  LVEDP at  the start of the procedure was 30 mmHg.  Aggressive diuresis following Lasix 40 mg with ultimate 2.8 L of urine output during the procedure.  RECOMMENDATION: Continue Aggrastat for minimum of 18 hours post procedure.  Bivalirudin will be discontinued 2 hours post procedure.  We will plan to keep the intra-aortic balloon pump one-to-one today.  Obtain follow-up 2D echo Doppler study.  Recommend long-term dual antiplatelet therapy as long as there is no recurrent GI bleeds.  Will assess drug screen with remote history of cocaine use.  Smoking cessation is essential.  Aggressive lipid-lowering therapy with target LDL less than 70.    Intervention         Patient Profile     Mr. Kazuki Ingle is a 53 year old gentleman who had previously suffered an anterior STEMI complicated by VF arrest in July 2019 and underwent successful defibrillation with ultimate stenting of his proximal LAD. A 3.0 x 20 mm Synergy DES stent was inserted. He initially had EF 25% with subsequently improved several months later to 45%. Patient has a history of ongoing tobacco use. There also has a history of cocaine use. He had developed a GI bleed ultimately leading to discontinuance of his dual antiplatelet therapy. He presented to the emergency room 12/16/18 after 30 hours of recurrent chest tightness. ECG shows anterolateral ST segment elevation myocardial infarction. A code STEMI was activated and he was brought emergently to the catheterization laboratory.  Assessment & Plan    1. Anterior STEMI:  - due to acute stent thrombosis after  stopping Plavix (initial infarct 7/19) - emergent cath 2/20 with DES to LAD 3.5 x 38 mm postdilated to 3.75 mm. Treated with aggrastat for heavy clot burden - troponin > 65 - echo EF 25-30% on 2/20 - Continue ASA, Brilinta. Restart statin. Lifelong DAPT with stent thrombosis.  - Continue low dose carvedilol - Continue spiro 12.5 daily - Add losartan 25 daily - CR seeing  2. Ischemic CM with cardiogenic shock. - echo EF 25-30% (previously 45%) on 2/20 - No overt HF volume ok.  - Med changes as above  3. Hypokalemia - resolved  4.  Iron-deficiency Anemia: Previously documented angiodysplasia.   - Hgb stable 9.4 - Iron stores low. Received  feraheme on 2/22.  - F/u GI as outpatient   5.  Markedly elevated liver function studies - due to [place]. Now normalized - atorva restarted  6.  Cocaine: Patient denies recent cocaine use.  However, urinary screen is positive  7. Tobacco use.   - Reinforced need for smoking cessation   7: Prior acute kidney injury requiring transient dialysis in July 2020.  Renal function so far stable post-cath with creatinine 0.90==86 today.   8.  Hypomagnesemia  - supped. Mag 2.3 today  [facility]. Likely home tomorrow.   Glori Bickers, MD  9:34 AM

## 2018-12-19 NOTE — Plan of Care (Signed)

## 2018-12-19 NOTE — Progress Notes (Signed)
Report called to [place], RN, who will be receiving patient to room 4E26.  Patient informed about transfer, change in status, and unit routine.  Transferred via wheelchair accompanied by NT.

## 2018-12-20 ENCOUNTER — Encounter (HOSPITAL_COMMUNITY): Payer: Self-pay | Admitting: Physician Assistant

## 2018-12-20 DIAGNOSIS — K7200 Acute and subacute hepatic failure without coma: Secondary | ICD-10-CM

## 2018-12-20 DIAGNOSIS — D509 Iron deficiency anemia, unspecified: Secondary | ICD-10-CM

## 2018-12-20 DIAGNOSIS — E876 Hypokalemia: Secondary | ICD-10-CM

## 2018-12-20 DIAGNOSIS — I2510 Atherosclerotic heart disease of native coronary artery without angina pectoris: Secondary | ICD-10-CM

## 2018-12-20 DIAGNOSIS — Z8719 Personal history of other diseases of the digestive system: Secondary | ICD-10-CM

## 2018-12-20 DIAGNOSIS — T82867A Thrombosis of cardiac prosthetic devices, implants and grafts, initial encounter: Secondary | ICD-10-CM

## 2018-12-20 LAB — COMPREHENSIVE METABOLIC PANEL
ALT: 24 U/L (ref 0–44)
ANION GAP: 9 (ref 5–15)
AST: 32 U/L (ref 15–41)
Albumin: 2.8 g/dL — ABNORMAL LOW (ref 3.5–5.0)
Alkaline Phosphatase: 63 U/L (ref 38–126)
BUN: 11 mg/dL (ref 6–20)
CHLORIDE: 105 mmol/L (ref 98–111)
CO2: 22 mmol/L (ref 22–32)
Calcium: 8.8 mg/dL — ABNORMAL LOW (ref 8.9–10.3)
Creatinine, Ser: 0.86 mg/dL (ref 0.61–1.24)
GFR calc Af Amer: >60 mL/min (ref >60)
GFR calc non Af Amer: >60 mL/min (ref >60)
Glucose, Bld: 106 mg/dL — ABNORMAL HIGH (ref 70–99)
Potassium: 4.3 mmol/L (ref 3.5–5.1)
Sodium: 136 mmol/L (ref 135–145)
Total Bilirubin: 0.5 mg/dL (ref 0.3–1.2)
Total Protein: 6.5 g/dL (ref 6.5–8.1)

## 2018-12-20 LAB — CBC
HCT: 31.4 % — ABNORMAL LOW (ref 39.0–52.0)
Hemoglobin: 9.1 g/dL — ABNORMAL LOW (ref 13.0–17.0)
MCH: 22 pg — ABNORMAL LOW (ref 26.0–34.0)
MCHC: 29 g/dL — ABNORMAL LOW (ref 30.0–36.0)
MCV: 76 fL — ABNORMAL LOW (ref 80.0–100.0)
PLATELETS: 323 10*3/uL (ref 150–400)
RBC: 4.13 MIL/uL — ABNORMAL LOW (ref 4.22–5.81)
RDW: 17.6 % — ABNORMAL HIGH (ref 11.5–15.5)
WBC: 6.8 10*3/uL (ref 4.0–10.5)
nRBC: 0 % (ref 0.0–0.2)

## 2018-12-20 MED ORDER — POTASSIUM CHLORIDE CRYS ER 20 MEQ PO TBCR: 20.0000 meq | EXTENDED_RELEASE_TABLET | Freq: Every day | ORAL | 6 refills | Status: DC

## 2018-12-20 MED ORDER — SPIRONOLACTONE 25 MG PO TABS: 12.5000 mg | ORAL_TABLET | Freq: Every day | ORAL | 6 refills | Status: DC

## 2018-12-20 MED ORDER — PANTOPRAZOLE SODIUM 40 MG PO TBEC: 40.0000 mg | DELAYED_RELEASE_TABLET | Freq: Two times a day (BID) | ORAL | 3 refills | Status: DC

## 2018-12-20 MED ORDER — POTASSIUM CHLORIDE CRYS ER 20 MEQ PO TBCR: 40.0000 meq | EXTENDED_RELEASE_TABLET | Freq: Every day | ORAL | Status: DC

## 2018-12-20 MED ORDER — LIVING BETTER WITH HEART FAILURE BOOK: Freq: Once | Status: DC

## 2018-12-20 MED ORDER — POTASSIUM CHLORIDE CRYS ER 20 MEQ PO TBCR: 20.0000 meq | EXTENDED_RELEASE_TABLET | Freq: Every day | ORAL | Status: DC

## 2018-12-20 MED ORDER — ATORVASTATIN CALCIUM 80 MG PO TABS: 80.0000 mg | ORAL_TABLET | Freq: Every evening | ORAL | 6 refills | Status: DC

## 2018-12-20 MED ORDER — TICAGRELOR 90 MG PO TABS: 90.0000 mg | ORAL_TABLET | Freq: Two times a day (BID) | ORAL | 11 refills | Status: DC

## 2018-12-20 MED ORDER — BUPROPION HCL ER (SR) 150 MG PO TB12: 150.0000 mg | ORAL_TABLET | Freq: Two times a day (BID) | ORAL | 2 refills | Status: DC

## 2018-12-20 MED ORDER — LOSARTAN POTASSIUM 25 MG PO TABS: 25.0000 mg | ORAL_TABLET | Freq: Every day | ORAL | 6 refills | Status: DC

## 2018-12-20 MED ORDER — CARVEDILOL 3.125 MG PO TABS: 3.1250 mg | ORAL_TABLET | Freq: Two times a day (BID) | ORAL | 6 refills | Status: DC

## 2018-12-20 MED FILL — ATORVASTATIN CALCIUM 80 MG: 80 | 30 days supply | Qty: 30 | Fill #0 | Status: TO

## 2018-12-20 MED FILL — buPROPion HCL ER (SR) 150 M: 150 | 30 days supply | Qty: 60 | Fill #0 | Status: TO

## 2018-12-20 MED FILL — PANTOPRAZOLE SOD DR 40 MG T: 40 | 30 days supply | Qty: 60 | Fill #0 | Status: TO

## 2018-12-20 MED FILL — CARVEDILOL 3.125 MG TABLET: 3.125 | 30 days supply | Qty: 60 | Fill #0 | Status: TO

## 2018-12-20 MED FILL — LOSARTAN POTASSIUM 25 MG TA: 25 | 30 days supply | Qty: 30 | Fill #0 | Status: TO

## 2018-12-20 MED FILL — BRILINTA 90 MG TABLET: 90 | 30 days supply | Qty: 60 | Fill #0 | Status: TO

## 2018-12-20 MED FILL — POTASSIUM CL ER 20 MEQ TABL: 20 | 30 days supply | Qty: 30 | Fill #0 | Status: TO

## 2018-12-20 MED FILL — SPIRONOLACTONE 25 MG TABLET: 25 | 30 days supply | Qty: 15 | Fill #0 | Status: TO

## 2018-12-20 NOTE — Progress Notes (Signed)
Pt iv's removed and intact. Pt telebox removed/CCMD notified. Discharge instructions provided and pt educated. Meds received from TCU. Vitals stable. Pt denies any complaints. Pt tx via wheelchair to valet to meet ride. Jerald Kief, RN

## 2018-12-20 NOTE — Progress Notes (Signed)
CARDIAC REHAB PHASE I   PRE:  Rate/Rhythm: 103 ST    BP: sitting 107/63    SaO2:   MODE:  Ambulation: 470 ft   POST:  Rate/Rhythm: 122 ST    BP: sitting 117/80     SaO2:   Tolerated well, feels good. HR elevated with distance or with excitement. In depth discussion of MI and risk factor modification.  Pt very receptive. Sts he is expecting his first child in 3 weeks and wants to be healthy. He admitted to doing one line of cocaine for a birthday but sts that is all he has done in 6 months and plans to be done for good now. Encouraged him to reach out to resources if needed. He sts his baby is enough. Discussed restrictions, Brilinta, smoking cessation, diet (also low sodium for HF), ex, NTG, CRPII, and daily wts. Pt seems motivated for change. Sts he had trouble recently with MD office not filling his meds when pharmacy called. He is not sure who his cardiologist is (it was confusing when he left [facility] in the summer). Encouraged him to discuss all of this today. Will refer to Phil Campbell, ACSM 12/20/2018 10:41 AM

## 2018-12-20 NOTE — Progress Notes (Addendum)
Progress Note  Patient Name: Howard Thompson Date of Encounter: 12/20/2018  Primary Cardiologist: Valaria Good, MD -> but wants to change to [facility] - Dr. Claiborne Billings has seen him through multiple admissions.  Subjective   Feeling fine today. Wife's expecting a baby in 3 weeks. Expresses desire to change cardiologist as his current one at [facility] is more academic and has limited clinic time. Denies any CP, SOB, bleeding.  Inpatient Medications    Scheduled Meds: . aspirin  81 mg Oral Daily  . atorvastatin  80 mg Oral q1800  . buPROPion  150 mg Oral BID  . carvedilol  3.125 mg Oral BID WC  . Chlorhexidine Gluconate Cloth  6 each Topical Daily  . enoxaparin (LOVENOX) injection  40 mg Subcutaneous Q24H  . folic acid  1 mg Oral Daily  .  HYDROmorphone (DILAUDID) injection  0.5 mg Intravenous Once  . losartan  25 mg Oral Daily  . Melatonin  6 mg Oral QHS  . multivitamin with minerals  1 tablet Oral Daily  . pantoprazole  40 mg Oral BID  . [START ON 12/21/2018] potassium chloride  20 mEq Oral Daily  . sodium chloride flush  3 mL Intravenous Q12H  . spironolactone  12.5 mg Oral Daily  . thiamine  100 mg Oral Daily  . ticagrelor  90 mg Oral BID   Continuous Infusions: . sodium chloride    . ferumoxytol Stopped (12/18/18 1153)   PRN Meds: sodium chloride, acetaminophen, diazepam, nitroGLYCERIN, ondansetron (ZOFRAN) IV, sodium chloride flush, zolpidem   Vital Signs    Vitals:   12/19/18 2346 12/20/18 0418 12/20/18 0831 12/20/18 0839  BP: 106/68 120/81 107/63 107/63  Pulse: 93  96 96  Resp: 18 19 18    Temp: 98.4 F (36.9 C) 97.8 F (36.6 C) 98.4 F (36.9 C)   TempSrc: Oral Oral Oral   SpO2: 99% 98% 100%   Weight:  74.7 kg    Height:        Intake/Output Summary (Last 24 hours) at 12/20/2018 1052 Last data filed at 12/19/2018 2239 Gross per 24 hour  Intake 0 ml  Output -  Net 0 ml   Last 3 Weights 12/20/2018 12/18/2018 12/17/2018  Weight (lbs) 164 lb 10.9 oz 167 lb 15.9 oz 161 lb  2.5 oz  Weight (kg) 74.7 kg 76.2 kg 73.1 kg     Telemetry    NSR/sinus tach - Personally Reviewed  Physical Exam   GEN: No acute distress.  HEENT: Normocephalic, atraumatic, sclera non-icteric. Neck: No JVD or bruits. Cardiac: RRR no murmurs, rubs, or gallops.  Radials/DP/PT 1+ and equal bilaterally.  Respiratory: Clear to auscultation bilaterally. Breathing is unlabored. GI: Soft, nontender, non-distended, BS +x 4. MS: no deformity. Extremities: No clubbing or cyanosis. No edema. Distal pedal pulses are 2+ and equal bilaterally. Right radial and groin cath sites without ecchymosis, bruit, hematoma. Neuro:  AAOx3. Follows commands. Psych:  Responds to questions appropriately with a normal affect.  Labs    Chemistry Recent Labs  Lab 12/18/18 0239 12/19/18 0312 12/20/18 0245  NA 137 136 136  K 3.6 4.2 4.3  CL 106 108 105  CO2 23 20* 22  GLUCOSE 98 97 106*  BUN 13 11 11   CREATININE 0.86 0.86 0.86  CALCIUM 8.0* 8.5* 8.8*  PROT 6.2* 6.4* 6.5  ALBUMIN 2.8* 2.9* 2.8*  AST 129* 57* 32  ALT 42 31 24  ALKPHOS 62 64 63  BILITOT 0.7 0.8 0.5  GFRNONAA >60 >60 >60  GFRAA >60 >60 >60  ANIONGAP 8 8 9      Hematology Recent Labs  Lab 12/18/18 0239 12/19/18 0312 12/20/18 0245  WBC 7.8 7.5 6.8  RBC 4.25 4.17* 4.13*  HGB 9.5* 9.4* 9.1*  HCT 32.2* 31.7* 31.4*  MCV 75.8* 76.0* 76.0*  MCH 22.4* 22.5* 22.0*  MCHC 29.5* 29.7* 29.0*  RDW 17.6* 17.7* 17.6*  PLT 262 298 323    Cardiac Enzymes Recent Labs  Lab 12/16/18 1505 12/16/18 1840 12/16/18 2318  TROPONINI >65.00* >65.00* >65.00*    Recent Labs  Lab 12/16/18 1023  TROPIPOC >30.00*     BNPNo results for input(s): BNP, PROBNP in the last 168 hours.   DDimer No results for input(s): DDIMER in the last 168 hours.   Radiology    No results found.  Cardiac Studies   2D echo 12/16/18 IMPRESSIONS  1. The left ventricle has severely reduced systolic function, with an ejection fraction of 25-30%. The cavity  size was normal. Left ventricular diastolic Doppler parameters are consistent with impaired relaxation.  2. Severe hypokinesis of the left ventricular anteroseptal wall, inferoseptal wall and apical segment.  3. No thrombus.  4. The right ventricle has normal systolic function. The cavity was normal. There is no increase in right ventricular wall thickness.  5. The tricuspid valve is normal in structure.  6. The aortic valve is tricuspid Mild thickening of the aortic valve Mild calcification of the aortic valve. Aortic valve regurgitation is trivial by color flow Doppler.  7. The pulmonic valve was normal in structure.  8. The mitral valve is normal in structure.  Patient Profile     53 y.o. male with cocaine abuse, history of alcohol/tobacco abuse, myasthenia gravis, suspected COPD, PAD s/p L femoral endarterectomy 2016, remote hx of POBA in [place], out-of-hospital cardiac arrest 04/2018 (vfib/STEMI) s/p DES to prox LAD, cardiogenic shock/ICM (admission complicated by GIB, ARF requiring HD, shock liver, anemia, thrombocytopenia, duodenitis). 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 ASA/Brilinta changed to Plavix monotherapy although in OP phone note from [facility] 2/21 indicates they had held ASA/Plavix both and he was to go back on ASA 81mg  daily. Patient indicates he self-discontinued Plavix beginning of the new year. He was readmitted 12/16/2018 with late-presenting anterior STEMI (CP > 30 hours). Underwent acute cath with total near ostial occlusion of the LAD with extensive thrombus burden -> prolonged PCI with DES. He was treated with aggrastat for heavy clot burden. He also has had cardiogenic shock requiring IABP, elevated LFTs felt due to MI/transient shock, + UDS for cocaine, hypokalemia, and hypomagnesemia.  Assessment & Plan    1. Anterior STEMI due to acute stent thrombosis after pt self discontinued Plavix (was supposed to be on  Plavix monotherapy) - now on ASA/Brilinta - need to be cautious given his history of recurrent GI bleeding. Lifelong DAPT recommended given hx of stent thrombosis. Note Brilinta cost is $300/mo per care management note. Continue BB, losartan, spironolactone. Wrote care order for nurse to replace cath sites tegaderm with band aids.   2. Ischemic cardiomyopathy with cardiogenic shock - LVEF 25-30% by echo. I will review with MD whether Lifevest needs to be considered? May be worthwhile to repeat limited echo. Rx CHF book.  3. Hypokalemia/hypomagnesemia - resolved. Now that he is on spiro, will change KCL to 37meq daily.  4. Iron deficiency anemia with prior history of angiodysplasia/GIB - received feraheme this admission. Hgb was 12.1 on admission,  in the 9 range the last 3 days. Will need close OP f/u of Hgb. He will monitor for dark stools, weakness, dizziness.  5. Shock liver - LFTs normalized. Statin resumed. Recommend f/u as OP.  6. Tobacco/cocaine use and prior ETOH - Pt denied cocaine use to cardiology team but reported to cardiac rehab doing one line of cocaine for a birthday. Wife is recovering heroin addict, now on methadone. Notes indicate history of being vague about usage. Cessation has been advised.  7. H/o ARF requiring HD 04/2018 - Cr stable.  Possible DC today pending decision of Lifevest. Will review with MD. TOC f/u arranged 3/2 at 10am with Dr. Evette Georges care team Almyra Deforest). He does want to use the [facility] pharmacy service as well.  For questions or updates, please contact [facility] Please consult [email or web address] for contact info under Cardiology/STEMI.  Signed, Charlie Pitter, PA-C 12/20/2018, 10:52 AM    Personally seen and examined. Agree with above.  Ready for DC. Examined, RRR, CTAB, soft belly. Labs reviewed. No life vest.  Brilinta too expensive. Will transition to Plavix (load included 150)  Candee Furbish, MD

## 2018-12-20 NOTE — Care Management Note (Signed)
Case Management Note Marvetta Gibbons RN, BSN Transitions of Care Unit 4E- RN Case Manager [phone]  Patient Details  Name: Howard Thompson MRN: [id number] Date of Birth: Nov 13, 1965  Subjective/Objective:   Pt admitted with STEMI, s/p DES                 Action/Plan: PTA pt lived at home with spouse, referral received for Brilinta needs- per benefits check copay cost near $50- spoke with pt who has been on Brilinta in past- pt unsure if he has ever used 30 day free or copay assist cards. Have asked for script to be sent to [facility] so that coverage info can be verified with copay assist card. Per [facility] pharmacy- pt has never used 30 day free card, and with Brilinta copay assist card- mo. copay would be $72.04. CM spoke with pt again regarding this cost and per pt he can afford the $72 per mo cost at this time. Explained to pt the importance of Brilinta and not to stop taking due to cost without first speaking with his doctor about a more affordable plan (plavix would be $10/mo). Plan at this time will be to fill 30 day free here with [facility] pharmacy along with other d/c scripts. And pt will plan to use copay assist card at his local [facility] pharmacy starting next month.   Expected Discharge Date:  12/20/18               Expected Discharge Plan:  Home/Self Care  In-House Referral:  NA  Discharge planning Services  CM Consult, Medication Assistance  Post Acute Care Choice:  NA Choice offered to:  NA  DME Arranged:    DME Agency:     HH Arranged:    HH Agency:     Status of Service:  Completed, signed off  If discussed at [facility] of Stay Meetings, dates discussed:    Discharge Disposition: home/self care  Additional Comments:  Dawayne Patricia, RN 12/20/2018, 4:07 PM

## 2018-12-20 NOTE — Discharge Summary (Addendum)
Discharge Summary    Patient ID: Howard Thompson,  MRN: [id number], DOB/AGE: 03-04-1966 53 y.o.  Admit date: 12/16/2018 Discharge date: 12/20/2018  Primary Care Provider: Jamesetta Geralds Primary Cardiologist: Shelva Majestic, MD (previously Dr. Donley Redder)  Discharge Diagnoses    Principal Problem:   STEMI (ST elevation myocardial infarction) [facility]) Active Problems:   Substance abuse ([place])   Ischemic cardiomyopathy   Hyperlipidemia LDL goal <70   Coronary stent thrombosis   Shock liver   History of GI bleed   Iron deficiency anemia   Hypokalemia   Hypomagnesemia   CAD in native artery   Diagnostic Studies/Procedures      Cardiac Cath 12/16/18 Conclusion     Ost LAD to Prox LAD lesion is 100% stenosed.  Post intervention, there is a 0% residual stenosis.  A stent was successfully placed.   Acute/Subacute thrombotic occlusion of the previously placed  3.0 x 20 mm Synergy  stent in the proximal LAD with clot extending almost to the LAD ostium with chest pain duration of at least 30 hours.  There was extensive recalcitrant thrombotic burden which persisted despite Angiomax, Aggrastat, multiple runs of Pronto thrombectomy, intracoronary nitroglycerin, intracoronary adenosine, distal infusion of adenosine, with ultimate complete resolution after insertion of an intra-aortic balloon pump for element of early cardiogenic shock and successful DES stenting of the LAD extending from the ostium to the mid vessel with insertion of a 3.5 x 38 mm Resolute stent postdilated to 3.75 mm with the 100% occlusion being reduced to 0% and after 3-1/2-hour procedure, ultimate reestablishment of brisk TIMI-3 flow antegrade down the LAD system which extends around the LV apex.  Normal left circumflex coronary artery.  Normal dominant RCA with initial faint collateralization of the LAD system via the distal RCA.  LVEDP at the start of the procedure was 30 mmHg.  Aggressive diuresis following  Lasix 40 mg with ultimate 2.8 L of urine output during the procedure.  RECOMMENDATION: Continue Aggrastat for minimum of 18 hours post procedure.  Bivalirudin will be discontinued 2 hours post procedure.  We will plan to keep the intra-aortic balloon pump one-to-one today.  Obtain follow-up 2D echo Doppler study.  Recommend long-term dual antiplatelet therapy as long as there is no recurrent GI bleeds.  Will assess drug screen with remote history of cocaine use.  Smoking cessation is essential.  Aggressive lipid-lowering therapy with target LDL less than 70.   2D echo 12/16/18 IMPRESSIONS 1. The left ventricle has severely reduced systolic function, with an ejection fraction of 25-30%. The cavity size was normal. Left ventricular diastolic Doppler parameters are consistent with impaired relaxation. 2. Severe hypokinesis of the left ventricular anteroseptal wall, inferoseptal wall and apical segment. 3. No thrombus. 4. The right ventricle has normal systolic function. The cavity was normal. There is no increase in right ventricular wall thickness. 5. The tricuspid valve is normal in structure. 6. The aortic valve is tricuspid Mild thickening of the aortic valve Mild calcification of the aortic valve. Aortic valve regurgitation is trivial by color flow Doppler. 7. The pulmonic valve was normal in structure. 8. The mitral valve is normal in structure.     _____________     History of Present Illness     Howard Thompson is a 53 y.o. male with history of CAD, ischemic cardiomyopathy, cardiac arrest, cocaine abuse, history of alcohol/tobacco abuse, myasthenia gravis s/p thymectomy, suspected COPD,PAD s/p L femoral endarterectomy 2016. He has a remote hx of POBA in [place] but more recently suffered  an out-of-hospital cardiac arrest 04/2018 (due to vfib/STEMI) s/p DES to prox LAD. He had prolonged admission at that time due to cardiogenic shock/ischemic cardiomyoapthy, GI bleed, acute renal failure  requiring temporary dialysis, shock liver, acute encephalopathy, anemia, thrombocytopenia, and duodenitis. Initial EF was 25-30% but improved to 40-45% closer to discharge so did not require Lifevest or ICD at discharge. In 06/2018 he was readmitted with severe GIB (Hgb 4.4) with AVMs which were treated with argon plasma coagulation and clipping. ASA and Brilinta were changed to Plavix monotherapy. At that time the patient declined to follow-up locally and requested to follow up at [facility] where he was already established. At some point in the beginning of the year, the patient self-discontinued Plavix for unclear reasons.   THIS ADMISSION: He was readmitted 12/16/2018 with late-presenting anterior STEMI with over 30 hours of chest pain. To briefly summarize this admission, he underwent acute cath with total near ostial occlusion of the LAD with extensive thrombus burden and prolonged PCI with DES. He was treated with Aggrastat for heavy clot burden. He also has had cardiogenic shock requiring IABP, elevated LFTs felt due to MI/transient shock, + UDS for cocaine, hypokalemia, and hypomagnesemia.  Hospital Course    1. Anterior STEMI due to acute stent thrombosis after patient self discontinued Plavix. He underwent PCI as outlined above with prolonged case. Patient advised to monitor for recurrent GI bleeding at home. Lifelong dual antiplatelet therapy is recommended given hx of stent thrombosis and he was placed back on aspirin and Brilinta. At first there was some thought Brilinta would be cost prohibitive as he has a $300 copay. However, in discussing with Care Management he never actually used the 30 day free or copay card in the past so he is eligible to use these this admission. His copay card brings his further cost down to $72/mo which he feels is doable. He was strongly advised to contact our office ASAP if he finds at any time this is cost prohibitive at which time Dr. Marlou Porch has advised he may  change to Plavix with a cross-over load. His medications were adjusted to optimize his HF regimen. Toprol and Bystolic were both on his PTA med list although he was only on Toprol; this was changed to carvedilol given + cocaine UDS. He will continue atorvastatin.   2. Ischemic cardiomyopathy with cardiogenic shock - LVEF 25-30% by echo. As above, beta blocker adjusted. He is also started on losartan and spironolactone. BP is a little soft for Entresto but can be considered going forward. Dr. Marlou Porch did not feel Lifevest was warranted - he suggested considering outpatient echo in 45 days. Will defer to outpatient setting to finalize timing. Recommend BMET be ordered at time of follow-up appointment. Cardiac rehab reviewed  sodium restriction/ fluid restriction, daily weights with patient.  3. Hypokalemia/hypomagnesemia - resolved with supplementation. We will discharge on low dose KCl 69meq daily. Recommend BMET in follow-up as above.  4. Iron deficiency anemia with prior history of angiodysplasia/GIB - received feraheme this admission. Hgb was 12.1 on admission in acute setting, but has ranged in the 9 range the last 3 days. Will need close OP f/u of Hgb - recommend CBC at time of follow-up visit. He will monitor for dark stools, weakness, dizziness.  5. Shock liver - LFTs normalized - statin temporarily held then resumed at 80mg  daily. LDL was 90. If the patient is tolerating statin at time of follow-up appointment, would consider rechecking liver function/lipid panel in 6-8  weeks.  6. Tobacco/cocaine use and prior ETOH - Pt denied cocaine use to cardiology team but reported to cardiac rehab doing one line of cocaine for a birthday. Wife is recovering heroin addict, now on methadone. Notes indicate history of being vague about usage. Total cessation has been advised and he seems motivated. He reports the birthday party was one usage in 6 months' time but has been educated extensively particularly by  cardiac rehab team on the dangers of use. Bupropion was started in the hospital and he should continue for 7-12 weeks.  7. H/o ARF requiring HD 04/2018 - Cr stable this admission.  Howard Thompson has made good progress and expresses desire to care for himself going forward. His wife is expecting a baby in 3 weeks. Dr. Marlou Porch has seen and examined the patient today and feels he is stable for discharge. TOC f/u arranged 3/2 at 10am with Dr. Evette Georges care team Howard Thompson). He does want to use the [facility] pharmacy service as well. DC instructions are outlined below. _____________  Discharge Vitals Blood pressure 100/66, pulse 100, temperature 98 F (36.7 C), temperature source Oral, resp. rate 18, height 5\' 9"  (1.753 m), weight 74.7 kg, SpO2 100 %.  Filed Weights   12/17/18 0500 12/18/18 0500 12/20/18 0418  Weight: 73.1 kg 76.2 kg 74.7 kg    Labs & Radiologic Studies    CBC Recent Labs    12/19/18 0312 12/20/18 0245  WBC 7.5 6.8  HGB 9.4* 9.1*  HCT 31.7* 31.4*  MCV 76.0* 76.0*  PLT 298 478   Basic Metabolic Panel Recent Labs    12/18/18 0239 12/19/18 0312 12/20/18 0245  NA 137 136 136  K 3.6 4.2 4.3  CL 106 108 105  CO2 23 20* 22  GLUCOSE 98 97 106*  BUN 13 11 11   CREATININE 0.86 0.86 0.86  CALCIUM 8.0* 8.5* 8.8*  MG 2.3  --   --    Liver Function Tests Recent Labs    12/19/18 0312 12/20/18 0245  AST 57* 32  ALT 31 24  ALKPHOS 64 63  BILITOT 0.8 0.5  PROT 6.4* 6.5  ALBUMIN 2.9* 2.8*  _____________  Dg Chest 1 View  Result Date: 12/18/2018 CLINICAL DATA:  Central line placement. EXAM: CHEST  1 VIEW COMPARISON:  12/16/2018 FINDINGS: Postsurgical changes from CABG. No central line is seen. The radiopaque tip of the intra-aortic balloon pump overlies the aortic arch, which is usually considered too high. Cardiomediastinal silhouette is normal. Mediastinal contours appear intact. Probable calcified lymph nodes in the left hilum. There is no evidence of focal airspace  consolidation, pleural effusion or pneumothorax. Osseous structures are without acute abnormality. Soft tissues are grossly normal. IMPRESSION: 1. The radiopaque tip of the intra-aortic balloon pump overlies the aortic arch, which is usually considered too high. 2. No central line is seen. 3. No evidence of pneumothorax. Electronically Signed   By: Fidela Salisbury M.D.   On: 12/18/2018 10:22   Portable Chest X-ray 1 View  Result Date: 12/16/2018 CLINICAL DATA:  Post recent cardiac stent placement. EXAM: PORTABLE CHEST 1 VIEW COMPARISON:  06/07/2018 and priors. FINDINGS: Postsurgical changes from CABG. Left cardiac stent noted. Probable calcified left hilar lymph nodes. Cardiomediastinal silhouette is normal. Mediastinal contours appear intact. There is no evidence of pneumothorax. Peribronchial airspace opacity in the right lower lobe, not significantly changed from 05/22/2018, the earliest available chest radiograph of this patient. Osseous structures are without acute abnormality. Soft tissues are grossly normal. IMPRESSION: Peribronchial  airspace opacity in the right lower lobe, not significantly changed from 05/22/2018. This may represent asymmetric pulmonary edema, airspace consolidation/aspiration, or less likely pulmonary mass. Follow-up with PA and lateral radiograph of the chest, when the patient is well is recommended. Electronically Signed   By: Fidela Salisbury M.D.   On: 12/16/2018 14:40   Disposition   Pt is being discharged home today in good condition.  Follow-up Plans & Appointments    Follow-up Information    Howard Thompson, [place] Follow up.   Specialties:  Cardiology, Radiology Why:  [facility] office - 12/27/18 at 10am. Please arrive 15 minutes early to check in. Howard Thompson is one of the PAs that works on Dr. Evette Georges care team to help ensure a smooth transition out of the hospital. Contact information: [address] [place] [address] [phone]            Discharge Instructions    Amb Referral to Cardiac Rehabilitation   Complete by:  As directed    Diagnosis:   STEMI Coronary Stents     Diet - low sodium heart healthy   Complete by:  As directed    Discharge instructions   Complete by:  As directed    Please review your medicine list when you get home since there have been several new medicine changes.  Regarding your bupropion (Wellbutrin/Zyban), this is typically continued for 7-12 weeks to help with smoking cessation. Please discuss your progress at your follow-up visit.  Excedrin migraine was removed from your list because this medicine contains aspirin - since you are already on aspirin and Brilinta, you should not take any medicines that contain extra aspirin.  No driving for 2 weeks. No lifting over 10 lbs for 4 weeks. No sexual activity for 4 weeks. You may not return to work until cleared by your cardiologist. Keep procedure site clean & dry. If you notice increased pain, swelling, bleeding or pus, call/return!  You may shower, but no soaking baths/hot tubs/pools for 1 week.  One of your heart tests showed weakness of the heart muscle this admission. This may make you more susceptible to weight gain from fluid retention, which can lead to symptoms that we call heart failure. Please follow these special instructions:  1. Follow a low-salt diet - you are allowed no more than 2,000mg  of sodium per day. Watch your fluid intake. In general, you should not be taking in more than 2 liters of fluid per day (no more than 8 glasses per day). This includes sources of water in foods like soup, coffee, tea, milk, etc. 2. Weigh yourself on the same scale at same time of day and keep a log. 3. Call your doctor: (Anytime you feel any of the following symptoms)  - 3lb weight gain overnight or 5lb within a few days - Shortness of breath, with or without a dry hacking cough  - Swelling in the hands, feet or stomach  - If you have to sleep on  extra pillows at night in order to breathe   IT IS IMPORTANT TO LET YOUR DOCTOR KNOW EARLY ON IF YOU ARE HAVING SYMPTOMS SO WE CAN HELP YOU!   Increase activity slowly   Complete by:  As directed       Discharge Medications   Allergies as of 12/20/2018   No Known Allergies     Medication List    STOP taking these medications   amLODipine 10 MG tablet Commonly known as:  NORVASC   amLODipine 5 MG tablet Commonly known as:  NORVASC   aspirin-acetaminophen-caffeine 250-250-65 MG tablet Commonly known as:  EXCEDRIN MIGRAINE   metoprolol succinate 25 MG 24 hr tablet Commonly known as:  TOPROL-XL   nebivolol 2.5 MG tablet Commonly known as:  BYSTOLIC   QUEtiapine 50 MG tablet Commonly known as:  SEROQUEL   thiamine 100 MG tablet     TAKE these medications   aspirin EC 81 MG tablet Take 81 mg by mouth daily.   atorvastatin 80 MG tablet Commonly known as:  LIPITOR Take 1 tablet (80 mg total) by mouth every evening. What changed:    medication strength  how much to take  when to take this   buPROPion 150 MG 12 hr tablet Commonly known as:  WELLBUTRIN SR Take 1 tablet (150 mg total) by mouth 2 (two) times daily.   carvedilol 3.125 MG tablet Commonly known as:  COREG Take 1 tablet (3.125 mg total) by mouth 2 (two) times daily with a meal.   folic acid 1 MG tablet Commonly known as:  FOLVITE Take 1 tablet (1 mg total) by mouth daily.   losartan 25 MG tablet Commonly known as:  COZAAR Take 1 tablet (25 mg total) by mouth daily. Start taking on:  December 21, 2018   multivitamin with minerals Tabs tablet Take 1 tablet by mouth daily.   nitroGLYCERIN 0.4 MG SL tablet Commonly known as:  NITROSTAT Place 0.4 mg under the tongue every 5 (five) minutes as needed for chest pain. Notes to patient:  UP TO 3 DOSES MAXIMUM  - If you have to take a 3rd dose, you should be calling 911   pantoprazole 40 MG tablet Commonly known as:  PROTONIX Take 1 tablet (40 mg  total) by mouth 2 (two) times daily. What changed:  when to take this   potassium chloride SA 20 MEQ tablet Commonly known as:  K-DUR,KLOR-CON Take 1 tablet (20 mEq total) by mouth daily. Start taking on:  December 21, 2018   spironolactone 25 MG tablet Commonly known as:  ALDACTONE Take 0.5 tablets (12.5 mg total) by mouth daily. Start taking on:  December 21, 2018   ticagrelor 90 MG Tabs tablet Commonly known as:  BRILINTA Take 1 tablet (90 mg total) by mouth 2 (two) times daily.       Allergies:  No Known Allergies  Aspirin prescribed at discharge?  Yes High Intensity Statin Prescribed? (Lipitor 40-80mg  or Crestor 20-40mg ): Yes Beta Blocker Prescribed? Yes For EF <40%, was ACEI/ARB Prescribed? Yes ADP Receptor Inhibitor Prescribed? (i.e. Plavix etc.-Includes Medically Managed Patients): Yes For EF <40%, Aldosterone Inhibitor Prescribed? Yes Was EF assessed during THIS hospitalization? Yes Was Cardiac Rehab II ordered? (Included Medically managed Patients): Yes   Outstanding Labs/Studies   See above - recommend BMET/CBC at time of dc, and LFTs/lipids in 6 weeks  Duration of Discharge Encounter   Greater than 30 minutes including physician time.  Signed, Nedra Hai Dunn PA-C 12/20/2018, 3:07 PM   Personally seen and examined. Agree with above.  53 year old post anterior ST elevation myocardial infarction, cocaine use.  Currently feeling well.  Ambulating well without any difficulty.  No anginal symptoms.  Exam is unremarkable, heart regular rate and rhythm lungs are clear abdomen soft no edema.  Lab work personally reviewed.  Expressed the importance of dual antiplatelet therapy, Brilinta at one point was thought to be a cost prohibitive drug.  After investigation, it looks like we are going to  be able to get him the medication at a more affordable price.  EF is reduced.  Repeat echocardiogram in 40 days.  If remains reduced less than 35%, ICD referral.  Candee Furbish,  MD

## 2018-12-21 ENCOUNTER — Telehealth (HOSPITAL_COMMUNITY): Payer: Self-pay

## 2018-12-21 NOTE — Telephone Encounter (Signed)
Attempted to call patient in regards to Cardiac Rehab - LM on VM °Gloria W. Support Rep II °

## 2018-12-21 NOTE — Telephone Encounter (Signed)
Pt insurance is active and benefits verified through [employer] $35.00, DED $5,000/$209 met, out of pocket $7,900/$256.96 met, co-insurance 0%. no pre-authorization required. [employer]port, 12/21/2018 @ 2:29pm, REF# [id number]  Will contact patient to see if he is interested in the Cardiac Rehab Program. If interested, patient will need to complete follow up appt. Once completed, patient will be contacted for scheduling upon review by the RN Navigator. Tedra Sen[place]. Support Rep

## 2018-12-27 ENCOUNTER — Ambulatory Visit: Payer: 59 | Admitting: Physician Assistant

## 2018-12-28 ENCOUNTER — Encounter: Payer: Self-pay | Admitting: *Deleted

## 2019-01-18 ENCOUNTER — Other Ambulatory Visit: Payer: Self-pay | Admitting: *Deleted

## 2019-01-18 NOTE — Telephone Encounter (Signed)
Received fax from [facility] indicating that they have been unable to reach [facility] to have these rx's transferred.

## 2019-01-19 ENCOUNTER — Emergency Department (HOSPITAL_COMMUNITY): Payer: 59

## 2019-01-19 ENCOUNTER — Encounter (HOSPITAL_COMMUNITY): Payer: Self-pay | Admitting: Emergency Medicine

## 2019-01-19 ENCOUNTER — Other Ambulatory Visit: Payer: Self-pay

## 2019-01-19 ENCOUNTER — Inpatient Hospital Stay (HOSPITAL_COMMUNITY)
Admission: EM | Admit: 2019-01-19 | Discharge: 2019-01-22 | DRG: 377 | Disposition: A | Discharge status: Home / Self Care | Payer: 59 | Attending: Student in an Organized Health Care Education/Training Program | Admitting: Student in an Organized Health Care Education/Training Program

## 2019-01-19 DIAGNOSIS — E876 Hypokalemia: Secondary | ICD-10-CM | POA: Diagnosis present

## 2019-01-19 DIAGNOSIS — I252 Old myocardial infarction: Secondary | ICD-10-CM

## 2019-01-19 DIAGNOSIS — I110 Hypertensive heart disease with heart failure: Secondary | ICD-10-CM | POA: Diagnosis present

## 2019-01-19 DIAGNOSIS — I5023 Acute on chronic systolic (congestive) heart failure: Secondary | ICD-10-CM

## 2019-01-19 DIAGNOSIS — E785 Hyperlipidemia, unspecified: Secondary | ICD-10-CM | POA: Diagnosis present

## 2019-01-19 DIAGNOSIS — K921 Melena: Secondary | ICD-10-CM

## 2019-01-19 DIAGNOSIS — I2102 ST elevation (STEMI) myocardial infarction involving left anterior descending coronary artery: Secondary | ICD-10-CM | POA: Diagnosis present

## 2019-01-19 DIAGNOSIS — D500 Iron deficiency anemia secondary to blood loss (chronic): Secondary | ICD-10-CM

## 2019-01-19 DIAGNOSIS — Z7902 Long term (current) use of antithrombotics/antiplatelets: Secondary | ICD-10-CM | POA: Diagnosis not present

## 2019-01-19 DIAGNOSIS — Z8739 Personal history of other diseases of the musculoskeletal system and connective tissue: Secondary | ICD-10-CM | POA: Diagnosis not present

## 2019-01-19 DIAGNOSIS — F17210 Nicotine dependence, cigarettes, uncomplicated: Secondary | ICD-10-CM | POA: Diagnosis present

## 2019-01-19 DIAGNOSIS — K635 Polyp of colon: Secondary | ICD-10-CM | POA: Diagnosis present

## 2019-01-19 DIAGNOSIS — E8989 Other postprocedural endocrine and metabolic complications and disorders: Secondary | ICD-10-CM | POA: Diagnosis present

## 2019-01-19 DIAGNOSIS — Z720 Tobacco use: Secondary | ICD-10-CM

## 2019-01-19 DIAGNOSIS — K3189 Other diseases of stomach and duodenum: Secondary | ICD-10-CM | POA: Diagnosis present

## 2019-01-19 DIAGNOSIS — I70209 Unspecified atherosclerosis of native arteries of extremities, unspecified extremity: Secondary | ICD-10-CM | POA: Diagnosis present

## 2019-01-19 DIAGNOSIS — Z955 Presence of coronary angioplasty implant and graft: Secondary | ICD-10-CM

## 2019-01-19 DIAGNOSIS — Z820 Family history of epilepsy and other diseases of the nervous system: Secondary | ICD-10-CM

## 2019-01-19 DIAGNOSIS — K922 Gastrointestinal hemorrhage, unspecified: Secondary | ICD-10-CM | POA: Diagnosis present

## 2019-01-19 DIAGNOSIS — Z833 Family history of diabetes mellitus: Secondary | ICD-10-CM

## 2019-01-19 DIAGNOSIS — Z7982 Long term (current) use of aspirin: Secondary | ICD-10-CM

## 2019-01-19 DIAGNOSIS — K210 Gastro-esophageal reflux disease with esophagitis: Secondary | ICD-10-CM | POA: Diagnosis present

## 2019-01-19 DIAGNOSIS — J449 Chronic obstructive pulmonary disease, unspecified: Secondary | ICD-10-CM | POA: Diagnosis present

## 2019-01-19 DIAGNOSIS — Z8674 Personal history of sudden cardiac arrest: Secondary | ICD-10-CM | POA: Diagnosis not present

## 2019-01-19 DIAGNOSIS — K5521 Angiodysplasia of colon with hemorrhage: Secondary | ICD-10-CM | POA: Diagnosis present

## 2019-01-19 DIAGNOSIS — D62 Acute posthemorrhagic anemia: Secondary | ICD-10-CM | POA: Diagnosis present

## 2019-01-19 DIAGNOSIS — I25111 Atherosclerotic heart disease of native coronary artery with angina pectoris with documented spasm: Secondary | ICD-10-CM | POA: Diagnosis not present

## 2019-01-19 DIAGNOSIS — Z8249 Family history of ischemic heart disease and other diseases of the circulatory system: Secondary | ICD-10-CM | POA: Diagnosis not present

## 2019-01-19 DIAGNOSIS — D649 Anemia, unspecified: Secondary | ICD-10-CM

## 2019-01-19 DIAGNOSIS — Z7901 Long term (current) use of anticoagulants: Secondary | ICD-10-CM

## 2019-01-19 DIAGNOSIS — I5020 Unspecified systolic (congestive) heart failure: Secondary | ICD-10-CM

## 2019-01-19 DIAGNOSIS — I248 Other forms of acute ischemic heart disease: Secondary | ICD-10-CM | POA: Diagnosis present

## 2019-01-19 DIAGNOSIS — I2510 Atherosclerotic heart disease of native coronary artery without angina pectoris: Secondary | ICD-10-CM | POA: Diagnosis present

## 2019-01-19 DIAGNOSIS — I5041 Acute combined systolic (congestive) and diastolic (congestive) heart failure: Secondary | ICD-10-CM

## 2019-01-19 DIAGNOSIS — Z9889 Other specified postprocedural states: Secondary | ICD-10-CM

## 2019-01-19 DIAGNOSIS — I255 Ischemic cardiomyopathy: Secondary | ICD-10-CM | POA: Diagnosis present

## 2019-01-19 DIAGNOSIS — Z79899 Other long term (current) drug therapy: Secondary | ICD-10-CM

## 2019-01-19 LAB — CBC WITH DIFFERENTIAL/PLATELET
ABS IMMATURE GRANULOCYTES: 0.04 10*3/uL (ref 0.00–0.07)
BASOS PCT: 0 %
Basophils Absolute: 0 10*3/uL (ref 0.0–0.1)
EOS ABS: 0.1 10*3/uL (ref 0.0–0.5)
Eosinophils Relative: 2 %
HCT: 22.3 % — ABNORMAL LOW (ref 39.0–52.0)
Hemoglobin: 6.1 g/dL — CL (ref 13.0–17.0)
Immature Granulocytes: 0 %
Lymphocytes Relative: 11 %
Lymphs Abs: 1 10*3/uL (ref 0.7–4.0)
MCH: 21 pg — ABNORMAL LOW (ref 26.0–34.0)
MCHC: 27.4 g/dL — ABNORMAL LOW (ref 30.0–36.0)
MCV: 76.9 fL — ABNORMAL LOW (ref 80.0–100.0)
Monocytes Absolute: 0.7 10*3/uL (ref 0.1–1.0)
Monocytes Relative: 8 %
NRBC: 0.5 % — AB (ref 0.0–0.2)
Neutro Abs: 7.1 10*3/uL (ref 1.7–7.7)
Neutrophils Relative %: 79 %
PLATELETS: 464 10*3/uL — AB (ref 150–400)
RBC: 2.9 MIL/uL — ABNORMAL LOW (ref 4.22–5.81)
RDW: 20.9 % — ABNORMAL HIGH (ref 11.5–15.5)
WBC: 9.1 10*3/uL (ref 4.0–10.5)

## 2019-01-19 LAB — RAPID URINE DRUG SCREEN, HOSP PERFORMED
Amphetamines: NOT DETECTED
Barbiturates: NOT DETECTED
Benzodiazepines: NOT DETECTED
Cocaine: NOT DETECTED
OPIATES: NOT DETECTED
Tetrahydrocannabinol: NOT DETECTED

## 2019-01-19 LAB — BASIC METABOLIC PANEL
Anion gap: 9 (ref 5–15)
BUN: 18 mg/dL (ref 6–20)
CO2: 21 mmol/L — ABNORMAL LOW (ref 22–32)
Calcium: 9.1 mg/dL (ref 8.9–10.3)
Chloride: 104 mmol/L (ref 98–111)
Creatinine, Ser: 1.07 mg/dL (ref 0.61–1.24)
GFR calc Af Amer: >60 mL/min (ref >60)
Glucose, Bld: 114 mg/dL — ABNORMAL HIGH (ref 70–99)
Potassium: 4.3 mmol/L (ref 3.5–5.1)
SODIUM: 134 mmol/L — AB (ref 135–145)

## 2019-01-19 LAB — TROPONIN I: Troponin I: 0.04 ng/mL (ref <0.03)

## 2019-01-19 LAB — IRON AND TIBC
Iron: 8 ug/dL — ABNORMAL LOW (ref 45–182)
Saturation Ratios: 1 % — ABNORMAL LOW (ref 17.9–39.5)
TIBC: 560 ug/dL — ABNORMAL HIGH (ref 250–450)
UIBC: 552 ug/dL

## 2019-01-19 LAB — HEMOGLOBIN AND HEMATOCRIT, BLOOD
HCT: 22.3 % — ABNORMAL LOW (ref 39.0–52.0)
Hemoglobin: 7 g/dL — ABNORMAL LOW (ref 13.0–17.0)

## 2019-01-19 LAB — PREPARE RBC (CROSSMATCH)

## 2019-01-19 LAB — BRAIN NATRIURETIC PEPTIDE: B Natriuretic Peptide: 553.9 pg/mL — ABNORMAL HIGH (ref 0.0–100.0)

## 2019-01-19 LAB — POC OCCULT BLOOD, ED: Fecal Occult Bld: POSITIVE — AB

## 2019-01-19 LAB — FERRITIN: Ferritin: 14 ng/mL — ABNORMAL LOW (ref 24–336)

## 2019-01-19 MED ORDER — BUPROPION HCL ER (SR) 150 MG PO TB12: 150.0000 mg | ORAL_TABLET | Freq: Two times a day (BID) | ORAL | 2 refills | Status: DC

## 2019-01-19 MED ORDER — TICAGRELOR 90 MG PO TABS
90.0000 mg | ORAL_TABLET | Freq: Two times a day (BID) | ORAL | Status: DC
  Administered 2019-01-19 – 2019-01-22 (×7): 90 mg via ORAL
  Filled 2019-01-19 (×7): qty 1

## 2019-01-19 MED ORDER — FOLIC ACID 1 MG PO TABS: 1.0000 mg | ORAL_TABLET | Freq: Every day | ORAL | 2 refills | Status: DC

## 2019-01-19 MED ORDER — FOLIC ACID 1 MG PO TABS
1.0000 mg | ORAL_TABLET | Freq: Every day | ORAL | Status: DC
  Administered 2019-01-19 – 2019-01-22 (×4): 1 mg via ORAL
  Filled 2019-01-19 (×4): qty 1

## 2019-01-19 MED ORDER — CARVEDILOL 3.125 MG PO TABS
3.1250 mg | ORAL_TABLET | Freq: Two times a day (BID) | ORAL | Status: DC
  Administered 2019-01-19 – 2019-01-22 (×6): 3.125 mg via ORAL
  Filled 2019-01-19 (×6): qty 1

## 2019-01-19 MED ORDER — BUPROPION HCL ER (SR) 150 MG PO TB12
150.0000 mg | ORAL_TABLET | Freq: Two times a day (BID) | ORAL | Status: DC
  Administered 2019-01-19 – 2019-01-22 (×7): 150 mg via ORAL
  Filled 2019-01-19 (×7): qty 1

## 2019-01-19 MED ORDER — TICAGRELOR 90 MG PO TABS: 90.0000 mg | ORAL_TABLET | Freq: Two times a day (BID) | ORAL | 2 refills | Status: DC

## 2019-01-19 MED ORDER — CARVEDILOL 3.125 MG PO TABS: 3.1250 mg | ORAL_TABLET | Freq: Two times a day (BID) | ORAL | 1 refills | Status: DC

## 2019-01-19 MED ORDER — LOSARTAN POTASSIUM 25 MG PO TABS
25.0000 mg | ORAL_TABLET | Freq: Every day | ORAL | Status: DC
  Administered 2019-01-20 – 2019-01-22 (×3): 25 mg via ORAL
  Filled 2019-01-19 (×3): qty 1

## 2019-01-19 MED ORDER — ASPIRIN 81 MG PO CHEW
324.0000 mg | CHEWABLE_TABLET | Freq: Once | ORAL | Status: AC
  Administered 2019-01-19: 324 mg via ORAL
  Filled 2019-01-19: qty 4

## 2019-01-19 MED ORDER — METOCLOPRAMIDE HCL 5 MG/ML IJ SOLN
10.0000 mg | Freq: Once | INTRAMUSCULAR | Status: AC
  Administered 2019-01-20: 10 mg via INTRAVENOUS
  Filled 2019-01-19: qty 2

## 2019-01-19 MED ORDER — SPIRONOLACTONE 12.5 MG HALF TABLET
12.5000 mg | ORAL_TABLET | Freq: Every day | ORAL | Status: DC
  Administered 2019-01-19 – 2019-01-22 (×4): 12.5 mg via ORAL
  Filled 2019-01-19 (×4): qty 1

## 2019-01-19 MED ORDER — FUROSEMIDE 10 MG/ML IJ SOLN
40.0000 mg | Freq: Once | INTRAMUSCULAR | Status: AC
  Administered 2019-01-19: 40 mg via INTRAVENOUS
  Filled 2019-01-19: qty 4

## 2019-01-19 MED ORDER — SODIUM CHLORIDE 0.9 % IV SOLN: 10.0000 mL/h | Freq: Once | INTRAVENOUS | Status: DC

## 2019-01-19 MED ORDER — FUROSEMIDE 10 MG/ML IJ SOLN
40.0000 mg | Freq: Every day | INTRAMUSCULAR | Status: DC
  Administered 2019-01-19 – 2019-01-21 (×3): 40 mg via INTRAVENOUS
  Filled 2019-01-19 (×4): qty 4

## 2019-01-19 MED ORDER — POTASSIUM CHLORIDE CRYS ER 20 MEQ PO TBCR: 20.0000 meq | EXTENDED_RELEASE_TABLET | Freq: Every day | ORAL | 2 refills | Status: DC

## 2019-01-19 MED ORDER — ONDANSETRON HCL 4 MG PO TABS: 4.0000 mg | ORAL_TABLET | Freq: Four times a day (QID) | ORAL | Status: DC | PRN

## 2019-01-19 MED ORDER — ACETAMINOPHEN 650 MG RE SUPP: 650.0000 mg | Freq: Four times a day (QID) | RECTAL | Status: DC | PRN

## 2019-01-19 MED ORDER — BISACODYL 5 MG PO TBEC
10.0000 mg | DELAYED_RELEASE_TABLET | Freq: Once | ORAL | Status: AC
  Administered 2019-01-19: 10 mg via ORAL
  Filled 2019-01-19: qty 2

## 2019-01-19 MED ORDER — LOSARTAN POTASSIUM 25 MG PO TABS: 25.0000 mg | ORAL_TABLET | Freq: Every day | ORAL | 2 refills | Status: DC

## 2019-01-19 MED ORDER — PANTOPRAZOLE SODIUM 40 MG PO TBEC
40.0000 mg | DELAYED_RELEASE_TABLET | Freq: Two times a day (BID) | ORAL | Status: DC
  Administered 2019-01-19 – 2019-01-21 (×5): 40 mg via ORAL
  Filled 2019-01-19 (×4): qty 1
  Filled 2019-01-19: qty 2

## 2019-01-19 MED ORDER — POLYETHYLENE GLYCOL 3350 17 GM/SCOOP PO POWD
0.5000 | Freq: Once | ORAL | Status: AC
  Administered 2019-01-19: 127.5 g via ORAL
  Filled 2019-01-19: qty 255

## 2019-01-19 MED ORDER — ONDANSETRON HCL 4 MG/2ML IJ SOLN: 4.0000 mg | Freq: Four times a day (QID) | INTRAMUSCULAR | Status: DC | PRN

## 2019-01-19 MED ORDER — PANTOPRAZOLE SODIUM 40 MG IV SOLR: 40.0000 mg | Freq: Two times a day (BID) | INTRAVENOUS | Status: DC

## 2019-01-19 MED ORDER — SODIUM CHLORIDE 0.9 % IV SOLN
510.0000 mg | Freq: Once | INTRAVENOUS | Status: AC
  Administered 2019-01-19: 510 mg via INTRAVENOUS
  Filled 2019-01-19: qty 17

## 2019-01-19 MED ORDER — ACETAMINOPHEN 325 MG PO TABS: 650.0000 mg | ORAL_TABLET | Freq: Four times a day (QID) | ORAL | Status: DC | PRN

## 2019-01-19 MED ORDER — ATORVASTATIN CALCIUM 80 MG PO TABS
80.0000 mg | ORAL_TABLET | Freq: Every evening | ORAL | Status: DC
  Administered 2019-01-19 – 2019-01-21 (×3): 80 mg via ORAL
  Filled 2019-01-19 (×3): qty 1

## 2019-01-19 NOTE — ED Notes (Signed)
ED TO INPATIENT HANDOFF REPORT  ED Nurse Name and Phone #:  Percell Locus, RN   S Name/Age/Gender [name] 53 y.o. male Room/Bed: 020C/020C  Code Status   Code Status: Full Code  Home/SNF/Other Home Patient oriented to: self, place, time and situation Is this baseline? Yes   Triage Complete: Triage complete  Chief Complaint cp sob  Triage Note Pt appears SOB and having "heaviness" in his chest, starting yesterday.  Worse when trying to sleep, "I wake up starving for air."  Slight bilateral swelling, in the past week, SOB upon exertion.  Pt reports he tried a nitro last night however it did not work.     Allergies No Known Allergies  Level of Care/Admitting Diagnosis ED Disposition    ED Disposition Condition Comment   Admit  Hospital Area: [facility] [[id number]]  Level of Care: Telemetry Cardiac [103]  Diagnosis: GI bleeding [[id number]]  Admitting Physician: Oda Kilts [[id number]]  Attending Physician: Oda Kilts [[id number]]  Estimated length of stay: past midnight tomorrow  Certification:: I certify this patient will need inpatient services for at least 2 midnights  PT Class (Do Not Modify): Inpatient [101]  PT Acc Code (Do Not Modify): Private [1]       B Medical/Surgery History Past Medical History:  Diagnosis Date  . Acute renal failure ([place])    a. a. during 7-05/2018 adm for cardiac arrest, requiring HD temporarily.  . Alcohol abuse   . Anemia   . AVM (arteriovenous malformation) of colon   . Cardiac arrest ([place]) 05/22/2018   Vfib  . Cocaine abuse ([place])   . COPD (chronic obstructive pulmonary disease) ([place])   . Coronary artery disease    a. hx of POBA in [place]. b. out-of-hospital cardiac arrest 04/2018 (vfib/STEMI) s/p DES to prox LAD (complex admission with GIB, renal failure requiring HD, encephalopathy). c. STEMI 11/2018 after pt stopped Plavix - s/p DES to LAD.  Marland Kitchen Duodenitis   . GI bleed   . History of blood transfusion   .  Hyperlipidemia   . Hypokalemia   . Hypomagnesemia   . Iron deficiency anemia   . Ischemic cardiomyopathy    a. EF <25% at time of cath/arrest 04/2018, improved to 40-45% by f/u echo 06/09/18.  . Myasthenia gravis ([place])   . Myocardial infarction ([place]) 05/22/2018   STEMI  . PAD (peripheral artery disease) ([place])    a. femoral bypass  . Shock liver    a. during 7-05/2018 adm for cardiac arrest and again in 11/2018.  Marland Kitchen Thrombocytopenia ([place]) 04/2018  . Tobacco abuse   . Ventricular fibrillation [facility])    Past Surgical History:  Procedure Laterality Date  . ATHERECTOMY    . BIOPSY  07/16/2018   Procedure: BIOPSY;  Surgeon: Gatha Mayer, MD;  Location: [facility] ENDOSCOPY;  Service: Endoscopy;;  . CARDIAC CATHETERIZATION  05/22/2018   stent  . COLONOSCOPY WITH PROPOFOL N/A 07/16/2018   Procedure: COLONOSCOPY WITH PROPOFOL;  Surgeon: Gatha Mayer, MD;  Location: [facility] ENDOSCOPY;  Service: Endoscopy;  Laterality: N/A;  . CORONARY ANGIOPLASTY    . CORONARY STENT INTERVENTION N/A 05/22/2018   Procedure: CORONARY STENT INTERVENTION;  Surgeon: Lorretta Harp, MD;  Location: [place] CV LAB;  Service: Cardiovascular;  Laterality: N/A;  . CORONARY/GRAFT ACUTE MI REVASCULARIZATION N/A 12/16/2018   Procedure: CORONARY/GRAFT ACUTE MI REVASCULARIZATION;  Surgeon: Troy Sine, MD;  Location: [place] CV LAB;  Service: Cardiovascular;  Laterality: N/A;  . ESOPHAGOGASTRODUODENOSCOPY (EGD)  WITH PROPOFOL N/A 06/10/2018   Procedure: ESOPHAGOGASTRODUODENOSCOPY (EGD) WITH PROPOFOL;  Surgeon: Ladene Artist, MD;  Location: [facility] ENDOSCOPY;  Service: Endoscopy;  Laterality: N/A;  . ESOPHAGOGASTRODUODENOSCOPY (EGD) WITH PROPOFOL N/A 07/16/2018   Procedure: ESOPHAGOGASTRODUODENOSCOPY (EGD) WITH PROPOFOL;  Surgeon: Gatha Mayer, MD;  Location: [place];  Service: Endoscopy;  Laterality: N/A;  . FEMORAL ARTERY - POPLITEAL ARTERY BYPASS GRAFT    . HOT HEMOSTASIS N/A 07/16/2018   Procedure: HOT HEMOSTASIS  (ARGON PLASMA COAGULATION/BICAP);  Surgeon: Gatha Mayer, MD;  Location: [facility] ENDOSCOPY;  Service: Endoscopy;  Laterality: N/A;  . IABP INSERTION N/A 12/16/2018   Procedure: IABP Insertion;  Surgeon: Troy Sine, MD;  Location: [place] CV LAB;  Service: Cardiovascular;  Laterality: N/A;  . IR FLUORO GUIDE CV LINE RIGHT  05/31/2018  . IR REMOVAL TUN CV CATH W/O FL  06/21/2018  . IR US GUIDE VASC ACCESS RIGHT  05/31/2018  . LEFT HEART CATH AND CORONARY ANGIOGRAPHY N/A 05/22/2018   Procedure: LEFT HEART CATH AND CORONARY ANGIOGRAPHY;  Surgeon: Lorretta Harp, MD;  Location: [place] CV LAB;  Service: Cardiovascular;  Laterality: N/A;  . LEFT HEART CATH AND CORONARY ANGIOGRAPHY N/A 12/16/2018   Procedure: LEFT HEART CATH AND CORONARY ANGIOGRAPHY;  Surgeon: Troy Sine, MD;  Location: [place] CV LAB;  Service: Cardiovascular;  Laterality: N/A;     A IV Location/Drains/Wounds Patient Lines/Drains/Airways Status   Active Line/Drains/Airways    Name:   Placement date:   Placement time:   Site:   Days:   Peripheral IV 01/19/19 Left Forearm   01/19/19    0550    Forearm   less than 1   Incision (Closed) 05/31/18 Chest Right   05/31/18    1608     233          Intake/Output Last 24 hours  Intake/Output Summary (Last 24 hours) at 01/19/2019 0932 Last data filed at 01/19/2019 2202 Gross per 24 hour  Intake 315 ml  Output 850 ml  Net -535 ml    Labs/Imaging Results for orders placed or performed during the hospital encounter of 01/19/19 (from the past 48 hour(s))  Basic metabolic panel     Status: Abnormal   Collection Time: 01/19/19  5:56 AM  Result Value Ref Range   Sodium 134 (L) 135 - 145 mmol/L   Potassium 4.3 3.5 - 5.1 mmol/L   Chloride 104 98 - 111 mmol/L   CO2 21 (L) 22 - 32 mmol/L   Glucose, Bld 114 (H) 70 - 99 mg/dL   BUN 18 6 - 20 mg/dL   Creatinine, Ser 1.07 0.61 - 1.24 mg/dL   Calcium 9.1 8.9 - 10.3 mg/dL   GFR calc non Af Amer >60 >60 mL/min   GFR calc Af  Amer >60 >60 mL/min   Anion gap 9 5 - 15    Comment: Performed at [facility], [place] [address], [place], [place] [address]  CBC with Differential     Status: Abnormal   Collection Time: 01/19/19  5:56 AM  Result Value Ref Range   WBC 9.1 4.0 - 10.5 K/uL   RBC 2.90 (L) 4.22 - 5.81 MIL/uL   Hemoglobin 6.1 (LL) 13.0 - 17.0 g/dL    Comment: REPEATED TO VERIFY Reticulocyte Hemoglobin testing may be clinically indicated, consider ordering this additional test [id number] THIS CRITICAL RESULT HAS VERIFIED AND BEEN CALLED TO J.GLOSSER,RN BY BONNIE DAVIS ON 03 25 2020 AT 0634, AND HAS  BEEN READ BACK.     HCT 22.3 (L) 39.0 - 52.0 %   MCV 76.9 (L) 80.0 - 100.0 fL   MCH 21.0 (L) 26.0 - 34.0 pg   MCHC 27.4 (L) 30.0 - 36.0 g/dL   RDW 20.9 (H) 11.5 - 15.5 %   Platelets 464 (H) 150 - 400 K/uL   nRBC 0.5 (H) 0.0 - 0.2 %   Neutrophils Relative % 79 %   Neutro Abs 7.1 1.7 - 7.7 K/uL   Lymphocytes Relative 11 %   Lymphs Abs 1.0 0.7 - 4.0 K/uL   Monocytes Relative 8 %   Monocytes Absolute 0.7 0.1 - 1.0 K/uL   Eosinophils Relative 2 %   Eosinophils Absolute 0.1 0.0 - 0.5 K/uL   Basophils Relative 0 %   Basophils Absolute 0.0 0.0 - 0.1 K/uL   Immature Granulocytes 0 %   Abs Immature Granulocytes 0.04 0.00 - 0.07 K/uL    Comment: Performed at [place] [address], [place], [place] [address]  Troponin I - Once     Status: Abnormal   Collection Time: 01/19/19  5:56 AM  Result Value Ref Range   Troponin I 0.04 (HH) <0.03 ng/mL    Comment: CRITICAL RESULT CALLED TO, READ BACK BY AND VERIFIED WITH: Candace Cruise [phone] [id number] [facility] Performed at [facility], [place] [address], [place], [place] [address]   Brain natriuretic peptide     Status: Abnormal   Collection Time: 01/19/19  5:57 AM  Result Value Ref Range   B Natriuretic Peptide 553.9 (H) 0.0 - 100.0 pg/mL    Comment: Performed at [place] [address] [place], [place] [address]  Prepare RBC     Status: None    Collection Time: 01/19/19  6:52 AM  Result Value Ref Range   Order Confirmation      ORDER PROCESSED BY BLOOD BANK Performed at [facility], [place] [address] [place], [place] [address]   Type and screen [place]     Status: None (Preliminary result)   Collection Time: 01/19/19  6:52 AM  Result Value Ref Range   ABO/RH(D) O POS    Antibody Screen NEG    Sample Expiration 01/22/2019    Unit Number [id number]    Blood Component Type RED CELLS,LR    Unit division 00    Status of Unit ISSUED    Transfusion Status OK TO TRANSFUSE    Crossmatch Result      Compatible Performed at [facility], [place] [address] [place], [place] [address]   POC occult blood, ED Provider will collect     Status: Abnormal   Collection Time: 01/19/19  6:53 AM  Result Value Ref Range   Fecal Occult Bld POSITIVE (A) NEGATIVE   Dg Chest 2 View  Result Date: 01/19/2019 CLINICAL DATA:  Shortness of breath EXAM: CHEST - 2 VIEW COMPARISON:  12/18/2018 FINDINGS: Normal heart size. Prior median sternotomy with history of CABG. There is diffuse interstitial coarsening with Kerley lines and cephalized blood flow. No pleural effusion. Borderline hyperinflation. IMPRESSION: CHF pattern. Electronically Signed   By: Monte Fantasia M.D.   On: 01/19/2019 06:21    Pending Labs Unresulted Labs (From admission, onward)    Start     Ordered   01/20/19 5035  Basic metabolic panel  Daily,   R     01/19/19 0849   01/20/19 0500  CBC  Tomorrow  morning,   R     01/19/19 0849   01/19/19 0852  Iron and TIBC  Add-on,   R     01/19/19 0851   01/19/19 0852  Ferritin  Add-on,   R     01/19/19 0851   01/19/19 0800  Urine rapid drug screen (hosp performed)  ONCE - STAT,   R     01/19/19 0759          Vitals/Pain Today's Vitals   01/19/19 0734 01/19/19 0815 01/19/19 0845 01/19/19 0922  BP:  (!) 146/88 125/76 (!) 131/118  Pulse:  (!) 117 (!) 113 (!) 102  Resp:  19 20 13   Temp:    99.4 F  (37.4 C)  TempSrc:    Oral  SpO2:  98% 97%   Weight:      Height:      PainSc: 3        Isolation Precautions No active isolations  Medications Medications  0.9 %  sodium chloride infusion (has no administration in time range)  acetaminophen (TYLENOL) tablet 650 mg (has no administration in time range)    Or  acetaminophen (TYLENOL) suppository 650 mg (has no administration in time range)  ondansetron (ZOFRAN) tablet 4 mg (has no administration in time range)    Or  ondansetron (ZOFRAN) injection 4 mg (has no administration in time range)  furosemide (LASIX) injection 40 mg (has no administration in time range)  aspirin chewable tablet 324 mg (324 mg Oral Given 01/19/19 0618)  furosemide (LASIX) injection 40 mg (40 mg Intravenous Given 01/19/19 9937)    Mobility walks with person assist Low fall risk   Focused Assessments Pulmonary Assessment Handoff:  Lung sounds:   O2 Device: Room Air        R Recommendations: See Admitting Provider Note  Report given to:   Additional Notes:  Blood transfusion started at 0922.

## 2019-01-19 NOTE — Consult Note (Addendum)
[facility] Consult: 9:06 AM 01/19/2019  LOS: 0 days    Referring Provider: Dr Reesa Chew  Primary Care Physician:  Jamesetta Geralds, MD Primary Gastroenterologist:  Dr. Carlean Purl     Reason for Consultation:  Anemia, FOBT +    IMPRESSION:   *   Anemia, FOBT positive inpatient on DOAC, Brilinta/81 ASA.  GI bleeding and ABL anemia in August and September 2019.  Findings included duodenitis and nonbleeding duodenal AVM which was eradicated with APC and Endo Clip.  Hyperplastic ascending colon polyp. Has been compliant with twice daily Protonix Suspect bleeding due to recurrent AVMs. PRBCs ordered  *   STEMI, instent restenosis/thrombosis after stopping Plavix 11/2018, s/p DES to LAD.  Started on Brilinta, low-dose aspirin after this.  Last Brilinta dose was PM 3/24, last night.  CHF currently by CXR an elevated BNP, suspect anemia contributing.     *    Substance abuse.  Admitted to using cocaine within the last week.  UDS pending.      PLAN:     *  ? Enteroscopy vs capsule endoscopy?.  Case D/W Dr Carlean Purl: proceed to capsule study tomorrow.  Mini bowel prep tonight.    *   Does not need IV Protonix.  Continue oral bid.   Ordered Feraheme.   Will get 1 U PRBC.     Azucena Freed  01/19/2019, 9:06 AM Phone [phone]    [facility] Attending   I have taken an interval history, reviewed the chart and seen the patient. I agree with the Advanced Practitioner's note, impression and recommendations.   We discussed and plan for a capsule endoscopy.  I think leaking from AVM's, failure to completely replete iron likely candidates for recent decline in Hgb and symptomatic iron deficiency anemia.  If capsule shows something treatable and within range of scope can try to ablate though some increased  risk of procedural bleeding with ticagrelor in system ablating AVM and prophylactic clips often appropriate.   Gatha Mayer, MD, [facility] 01/19/2019 11:39 AM Pager [phone]       HPI: Howard Thompson is a 53 y.o. male. Hx CAD.  VF arrest and cardiac stenting 04/2018. Ischemic cardiomyopathy, LVEF 25-30%. ASPVD. S/p left femoral endarterectomy 2016.  HTN.  Myasthenia gravis. Polysubstance abuse (cocaine, THC, amphetamines, benzo + in 11/2018).  AKI requiring temporary HD in 04/2018.       GI bleeding (melena) in summer 2019 in setting of Brilinta.  Eventually switched to Plavix but he stopped this a couple of weeks before suffering the MI in 11/2018.  Restarted Brilinta, 81 ASA along with continuing Protonix 40 mg BID 11/2018 for in-stent restenosis, STEMI.  During that admission received additional Feraheme for Hgb 12.1 >>  9.  Shock liver noted, recovered.  Has been compliant with PPI, Brilinta, aspirin.  Last Brilinta 3/24 in p.m.      06/10/18 EGD for anemia (7 U PRBCs, Hgb nadir 6.1), FOBT + (black stool): Patchy, friable duodenitis, small HH.  No ulcers, no biopsy.  07/16/2018 EGD for  Melena, Hgb 4.5. Single, nonbleeding duodenal AVM treated with APC and Endo Clip. 07/16/2018 colonoscopy.  Flat polyp (hyperplastic) in the ascending colon.  Otherwise normal study.  Fatigue started a few days ago but for the past 2 days it is been more profound and he gets tired just walking short distances of 3 yards.  Last night he became acutely short of breath at rest.  No active cough.  No extremity edema.  Now in ED with Hgb 6.1, MCV 76.  Hgb 9.1 on 2/24.  FOBT +, dark stool.   No renal dysfunction.  Elevated BNP.   CHF on CXR.  Vital signs stable with slight tachycardia in the 1 teens.  Stools dark brown, no melena or bleeding per rectum.Kermit Balo appetite.  No nausea or vomiting.  No abdominal pain. Admits to snorting cocaine last week.  Drank 2 beers last week.  Took 400 mg  ibuprofen last week but does not use this on a regular basis. His 40 year old girlfriend just gave birth to their child 8 days ago.    Past Medical History:  Diagnosis Date  . Acute renal failure ([place])    a. a. during 7-05/2018 adm for cardiac arrest, requiring HD temporarily.  . Alcohol abuse   . Anemia   . AVM (arteriovenous malformation) of colon   . Cardiac arrest ([place]) 05/22/2018   Vfib  . Cocaine abuse ([place])   . COPD (chronic obstructive pulmonary disease) ([place])   . Coronary artery disease    a. hx of POBA in [place]. b. out-of-hospital cardiac arrest 04/2018 (vfib/STEMI) s/p DES to prox LAD (complex admission with GIB, renal failure requiring HD, encephalopathy). c. STEMI 11/2018 after pt stopped Plavix - s/p DES to LAD.  Marland Kitchen Duodenitis   . GI bleed   . History of blood transfusion   . Hyperlipidemia   . Hypokalemia   . Hypomagnesemia   . Iron deficiency anemia   . Ischemic cardiomyopathy    a. EF <25% at time of cath/arrest 04/2018, improved to 40-45% by f/u echo 06/09/18.  . Myasthenia gravis ([place])   . Myocardial infarction ([place]) 05/22/2018   STEMI  . PAD (peripheral artery disease) ([place])    a. femoral bypass  . Shock liver    a. during 7-05/2018 adm for cardiac arrest and again in 11/2018.  Marland Kitchen Thrombocytopenia ([place]) 04/2018  . Tobacco abuse   . Ventricular fibrillation [facility])     Past Surgical History:  Procedure Laterality Date  . ATHERECTOMY    . BIOPSY  07/16/2018   Procedure: BIOPSY;  Surgeon: Gatha Mayer, MD;  Location: [facility] ENDOSCOPY;  Service: Endoscopy;;  . CARDIAC CATHETERIZATION  05/22/2018   stent  . COLONOSCOPY WITH PROPOFOL N/A 07/16/2018   Procedure: COLONOSCOPY WITH PROPOFOL;  Surgeon: Gatha Mayer, MD;  Location: [facility] ENDOSCOPY;  Service: Endoscopy;  Laterality: N/A;  . CORONARY ANGIOPLASTY    . CORONARY STENT INTERVENTION N/A 05/22/2018   Procedure: CORONARY STENT INTERVENTION;  Surgeon: Lorretta Harp, MD;  Location: [place] CV LAB;  Service:  Cardiovascular;  Laterality: N/A;  . CORONARY/GRAFT ACUTE MI REVASCULARIZATION N/A 12/16/2018   Procedure: CORONARY/GRAFT ACUTE MI REVASCULARIZATION;  Surgeon: Troy Sine, MD;  Location: [place] CV LAB;  Service: Cardiovascular;  Laterality: N/A;  . ESOPHAGOGASTRODUODENOSCOPY (EGD) WITH PROPOFOL N/A 06/10/2018   Procedure: ESOPHAGOGASTRODUODENOSCOPY (EGD) WITH PROPOFOL;  Surgeon: Ladene Artist, MD;  Location: [facility] ENDOSCOPY;  Service: Endoscopy;  Laterality: N/A;  . ESOPHAGOGASTRODUODENOSCOPY (EGD) WITH PROPOFOL N/A 07/16/2018  Procedure: ESOPHAGOGASTRODUODENOSCOPY (EGD) WITH PROPOFOL;  Surgeon: Gatha Mayer, MD;  Location: [facility] ENDOSCOPY;  Service: Endoscopy;  Laterality: N/A;  . FEMORAL ARTERY - POPLITEAL ARTERY BYPASS GRAFT    . HOT HEMOSTASIS N/A 07/16/2018   Procedure: HOT HEMOSTASIS (ARGON PLASMA COAGULATION/BICAP);  Surgeon: Gatha Mayer, MD;  Location: [facility] ENDOSCOPY;  Service: Endoscopy;  Laterality: N/A;  . IABP INSERTION N/A 12/16/2018   Procedure: IABP Insertion;  Surgeon: Troy Sine, MD;  Location: [place] CV LAB;  Service: Cardiovascular;  Laterality: N/A;  . IR FLUORO GUIDE CV LINE RIGHT  05/31/2018  . IR REMOVAL TUN CV CATH W/O FL  06/21/2018  . IR US GUIDE VASC ACCESS RIGHT  05/31/2018  . LEFT HEART CATH AND CORONARY ANGIOGRAPHY N/A 05/22/2018   Procedure: LEFT HEART CATH AND CORONARY ANGIOGRAPHY;  Surgeon: Lorretta Harp, MD;  Location: [place] CV LAB;  Service: Cardiovascular;  Laterality: N/A;  . LEFT HEART CATH AND CORONARY ANGIOGRAPHY N/A 12/16/2018   Procedure: LEFT HEART CATH AND CORONARY ANGIOGRAPHY;  Surgeon: Troy Sine, MD;  Location: [place] CV LAB;  Service: Cardiovascular;  Laterality: N/A;    Prior to Admission medications   Medication Sig Start Date End Date Taking? Authorizing Provider  aspirin EC 81 MG tablet Take 81 mg by mouth daily.    [provider]  atorvastatin (LIPITOR) 80 MG tablet Take 1 tablet (80 mg total) by  mouth every evening. 12/20/18   Dunn, Nedra Hai, PA-C  buPROPion (WELLBUTRIN SR) 150 MG 12 hr tablet Take 1 tablet (150 mg total) by mouth 2 (two) times daily. 12/20/18   Dunn, Nedra Hai, PA-C  carvedilol (COREG) 3.125 MG tablet Take 1 tablet (3.125 mg total) by mouth 2 (two) times daily with a meal. 12/20/18   Dunn, Nedra Hai, PA-C  folic acid (FOLVITE) 1 MG tablet Take 1 tablet (1 mg total) by mouth daily. 07/17/18   Hosie Poisson, MD  losartan (COZAAR) 25 MG tablet Take 1 tablet (25 mg total) by mouth daily. 12/21/18   Dunn, Nedra Hai, PA-C  Multiple Vitamin (MULTIVITAMIN WITH MINERALS) TABS tablet Take 1 tablet by mouth daily. 07/17/18   Hosie Poisson, MD  nitroGLYCERIN (NITROSTAT) 0.4 MG SL tablet Place 0.4 mg under the tongue every 5 (five) minutes as needed for chest pain.    [provider]  pantoprazole (PROTONIX) 40 MG tablet Take 1 tablet (40 mg total) by mouth 2 (two) times daily. 12/20/18   Dunn, Nedra Hai, PA-C  potassium chloride SA (K-DUR,KLOR-CON) 20 MEQ tablet Take 1 tablet (20 mEq total) by mouth daily. 12/21/18   Dunn, Nedra Hai, PA-C  spironolactone (ALDACTONE) 25 MG tablet Take 0.5 tablets (12.5 mg total) by mouth daily. 12/21/18   Dunn, Nedra Hai, PA-C  ticagrelor (BRILINTA) 90 MG TABS tablet Take 1 tablet (90 mg total) by mouth 2 (two) times daily. 12/20/18   Dunn, Nedra Hai, PA-C    Scheduled Meds: . furosemide  40 mg Intravenous Daily   Infusions: . sodium chloride     PRN Meds: acetaminophen **OR** acetaminophen, ondansetron **OR** ondansetron (ZOFRAN) IV   Allergies as of 01/19/2019  . (No Known Allergies)    Family History  Problem Relation Age of Onset  . Alzheimer's disease Father     Social History   Socioeconomic History  . Marital status: Divorced    Spouse name: Not on file  . Number of children: Not on file  . Years of education: Not on file  .  Highest education level: Not on file  Occupational History    Employer: [employer]  Social Needs   . Financial resource strain: Not on file  . Food insecurity:    Worry: Not on file    Inability: Not on file  . Transportation needs:    Medical: Not on file    Non-medical: Not on file  Tobacco Use  . Smoking status: Current Every Day Smoker    Packs/day: 1.00    Types: Cigarettes  . Smokeless tobacco: Current User  Substance and Sexual Activity  . Alcohol use: Yes    Comment: vague regarding current use  . Drug use: Yes    Types: Cocaine, Marijuana, Methamphetamines, Benzodiazepines    Comment: vaguely discusses useage, but quickly changes subject  . Sexual activity: Yes    Partners: Female  Lifestyle  . Physical activity:    Days per week: Not on file    Minutes per session: Not on file  . Stress: Not on file  Relationships  . Social connections:    Talks on phone: Patient refused    Gets together: Patient refused    Attends religious service: Patient refused    Active member of club or organization: Patient refused    Attends meetings of clubs or organizations: Patient refused    Relationship status: Patient refused  . Intimate partner violence:    Fear of current or ex partner: No    Emotionally abused: No    Physically abused: No    Forced sexual activity: No  Other Topics Concern  . Not on file  Social History Narrative   Divorced   Employed at [employer]   Polysubstance abuse    REVIEW OF SYSTEMS: Constitutional: Per HPI.  Weakness, fatigue. ENT:  No nose bleeds Pulm: Per HPI. CV:  No palpitations, no LE edema.  Chest pain. GU:  No hematuria, no frequency GI: Per HPI. Heme: Eyes excessive or unusual bleeding or bruising. Transfusions: Per HPI. Neuro:  No headaches, no peripheral tingling or numbness.  No syncope.  No visual changes. Derm:  No itching, no rash or sores.  Endocrine:  No sweats or chills.  No polyuria or dysuria Immunization: Not queried. Travel:  None beyond local counties in last few months.    PHYSICAL EXAM: Vital  signs in last 24 hours: Vitals:   01/19/19 0815 01/19/19 0845  BP: (!) 146/88 125/76  Pulse: (!) 117 (!) 113  Resp: 19 20  Temp:    SpO2: 98% 97%   Wt Readings from Last 3 Encounters:  01/19/19 74.8 kg  12/20/18 74.7 kg  07/16/18 64.6 kg    General: Looks well.  NAD.  Alert. Head: No facial asymmetry or swelling.  No signs of head trauma. Eyes: No scleral icterus.  No conjunctival pallor.  EOMI. Ears: Not hard of hearing Nose: No congestion, no discharge. Mouth: Moist, clear, pink oropharynx.  Tongue midline. Neck: No JVD, no masses, no thyromegaly Lungs: Clear bilaterally.  No labored breathing.  No cough Heart: Regular rhythm.  Rate 113 on monitor.  No MRG.,  S1/S2 present. Abdomen: Soft.  Nontender, nondistended.  No HSM, masses, bruits, hernias..   Rectal: Deferred.  This was done by the ED physician and stool is brown but FOBT positive. Musc/Skeltl: no joint redness, swelling, deformity. Extremities: No CCE. Neurologic:  Oriented x 3. No tremors or weakness.  Moves all 4 limbs.  Good historian.   Skin:  No rash, sores, telangiectasia. Nodes:  No cervical adenopathy. Psych: Operative, pleasant, fluid speech.  Intake/Output from previous day: No intake/output data recorded. Intake/Output this shift: Total I/O In: -  Out: 850 [Urine:850]  LAB RESULTS: Recent Labs    01/19/19 0556  WBC 9.1  HGB 6.1*  HCT 22.3*  PLT 464*   BMET Lab Results  Component Value Date   NA 134 (L) 01/19/2019   NA 136 12/20/2018   NA 136 12/19/2018   K 4.3 01/19/2019   K 4.3 12/20/2018   K 4.2 12/19/2018   CL 104 01/19/2019   CL 105 12/20/2018   CL 108 12/19/2018   CO2 21 (L) 01/19/2019   CO2 22 12/20/2018   CO2 20 (L) 12/19/2018   GLUCOSE 114 (H) 01/19/2019   GLUCOSE 106 (H) 12/20/2018   GLUCOSE 97 12/19/2018   BUN 18 01/19/2019   BUN 11 12/20/2018   BUN 11 12/19/2018   CREATININE 1.07 01/19/2019   CREATININE 0.86 12/20/2018   CREATININE 0.86 12/19/2018   CALCIUM 9.1  01/19/2019   CALCIUM 8.8 (L) 12/20/2018   CALCIUM 8.5 (L) 12/19/2018   LFT No results for input(s): PROT, ALBUMIN, AST, ALT, ALKPHOS, BILITOT, BILIDIR, IBILI in the last 72 hours. PT/INR Lab Results  Component Value Date   INR 1.02 06/18/2018   INR 1.23 06/04/2018   INR 1.18 06/01/2018    Drugs of Abuse     Component Value Date/Time   LABOPIA NONE DETECTED 12/16/2018 1701   COCAINSCRNUR POSITIVE (A) 12/16/2018 1701   LABBENZ POSITIVE (A) 12/16/2018 1701   AMPHETMU POSITIVE (A) 12/16/2018 1701   THCU POSITIVE (A) 12/16/2018 1701   LABBARB NONE DETECTED 12/16/2018 1701     RADIOLOGY STUDIES: Dg Chest 2 View  Result Date: 01/19/2019 CLINICAL DATA:  Shortness of breath EXAM: CHEST - 2 VIEW COMPARISON:  12/18/2018 FINDINGS: Normal heart size. Prior median sternotomy with history of CABG. There is diffuse interstitial coarsening with Kerley lines and cephalized blood flow. No pleural effusion. Borderline hyperinflation. IMPRESSION: CHF pattern. Electronically Signed   By: Monte Fantasia M.D.   On: 01/19/2019 06:21

## 2019-01-19 NOTE — H&P (Signed)
Date: 01/19/2019               Patient Name:  Howard Thompson MRN: [id number]  DOB: 04/06/1966 Age / Sex: 53 y.o., male   PCP: Jamesetta Geralds, MD         Medical Service: Internal Medicine Teaching Service         Attending Physician: Dr. Rebeca Alert Raynaldo Opitz, MD    First Contact: Dr. Laural Golden Pager: [phone]  Second Contact: Dr. Philipp Ovens Pager: [phone]       After Hours (After 5p/  First Contact Pager: [phone]  weekends / holidays): Second Contact Pager: [phone]   Chief Complaint: Worsening shortness of breath and fatigue.  History of Present Illness: Howard Thompson is a 53 y.o. gentleman with PMHx significant for CAD s/p LAD PCI twice, ischemic cardiomyopathy (EF 25-30%), history of cardiac arrest, GI bleed, myasthenia gravis s/p thymectomy, and polysubstance abuse which include tobacco, alcohol and cocaine came to ED with complaint of worsening fatigue and shortness of breath over the past week, last night he was unable to sleep and experiencing a lot of orthopnea and PND.  Per patient he has a baby 10 days ago, and exerting himself a lot, noticed worsening fatigue and shortness of breath even with few steps, he continued to push himself till last night when he becomes so short of breath that he could not lay down and sleep.  He was having some chest pressure and heaviness but denies any pain.  He denies any lower extremity edema.  According to him he is compliant with his medication.  He denies any obvious melena but states that his stools were little darker than normal.  Denies any abdominal pain, hematochezia or hematemesis.  Denies any nausea or vomiting.  No recent illness, travel or sick contact. He denies any urinary symptoms. Per patient his last cocaine use was couple of weeks ago, UDS was positive for cocaine, benzo, marijuana and amphetamines in the past.    Her sister came back from [place] 10 days ago to help with the baby, per patient she was tested before coming and is  symptom free.    Patient has a complicated cardiac and GI bleed history which is summarized as below. 05/22/18.  Had outside of hospital cardiac arrest, was taken to cardiac cath and found to have 100% occlusion of LAD which was stented, ejection fraction at that time was 25% which was improved to 45% before discharge.  His stay in hospital was complicated with GI bleed requiring multiple transfusions-endoscopy does shows mild duodenitis, renal failure requiring dialysis during his stay and alcohol withdrawal.  He was discharged in stable condition on 06/21/18.  He was discharged on aspirin, Brilinta and Bystolic, ACEI or ARB was avoided at that time due to kidney injury.  07/14/18.  Readmitted for 2 days due to GI bleed and hemoglobin of 4.5 requiring multiple transfusions.  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  Colonoscopy was normal.  Globin on discharge was 8.  Aspirin and Brilinta was held and converted to Plavix with the help of cardiology on discharge.  12/16/18.  Readmitted for anterior STEMI, cath found restenosis of LAD stent, was restented.  Apparently patient stopped taking Plavix in January stating that he was having difficulty with getting refills.  Repeat echo at that time shows decreased ejection fraction to 25 to 30%-advised to repeat echo in 45  days.  Patient was discharged on 12/20/18 with aspirin, Brilinta, carvedilol, losartan, Spironolactone and Protonix.  Hemoglobin on discharge was 9.  ED course.  He was afebrile, mostly normotensive, tachycardic, saturating well on room air, labs shows hemoglobin of 6.1, positive FOBT,BNP of 553, chest x-ray with pulmonary vascular congestion.  2 units of packed RBCs were ordered and he was given a dose of Lasix 40 mg IV.  Meds:  No outpatient medications have been marked as taking for the 01/19/19 encounter [facility] Encounter).     Allergies: Allergies as of 01/19/2019   (No Known Allergies)   Past Medical History:  Diagnosis Date   Acute renal failure ([place])    a. a. during 7-05/2018 adm for cardiac arrest, requiring HD temporarily.   Alcohol abuse    Anemia    AVM (arteriovenous malformation) of colon    Cardiac arrest ([place]) 05/22/2018   Vfib   Cocaine abuse ([facility])    COPD (chronic obstructive pulmonary disease) ([facility])    Coronary artery disease    a. hx of POBA in [place]. b. out-of-hospital cardiac arrest 04/2018 (vfib/STEMI) s/p DES to prox LAD (complex admission with GIB, renal failure requiring HD, encephalopathy). c. STEMI 11/2018 after pt stopped Plavix - s/p DES to LAD.   Duodenitis    GI bleed    History of blood transfusion    Hyperlipidemia    Hypokalemia    Hypomagnesemia    Iron deficiency anemia    Ischemic cardiomyopathy    a. EF <25% at time of cath/arrest 04/2018, improved to 40-45% by f/u echo 06/09/18.   Myasthenia gravis ([place])    Myocardial infarction ([place]) 05/22/2018   STEMI   PAD (peripheral artery disease) ([place])    a. femoral bypass   Shock liver    a. during 7-05/2018 adm for cardiac arrest and again in 11/2018.   Thrombocytopenia ([place]) 04/2018   Tobacco abuse    Ventricular fibrillation ([place])     Family History: Multiple family members with hypertension, diabetes , heart problem and autoimmune disorders.  Social History: Lives with his wife, had a newborn.  Continue to smoke although recently decreased to 5 to 7 cigarettes/day, before 1 pack/day, occasionally drinks alcohol.  Last use of marijuana and cocaine was couple of weeks ago.  Review of Systems: A complete ROS was negative except as per HPI.   Physical Exam: Blood pressure 127/81, pulse (!) 114, temperature 98.6 F (37 C), temperature source Oral, resp. rate (!) 29, height 5\' 9"  (1.753 m), weight 74.8 kg, SpO2 96 %. Vitals:   01/19/19 0730 01/19/19 0815 01/19/19 0845 01/19/19 0922  BP: 127/81  (!) 146/88 125/76 (!) 131/118  Pulse: (!) 114 (!) 117 (!) 113 (!) 102  Resp: (!) 29 19 20 13   Temp:    99.4 F (37.4 C)  TempSrc:    Oral  SpO2: 96% 98% 97%   Weight:      Height:       General: Vital signs reviewed.  Patient is well-developed and well-nourished, in no acute distress and cooperative with exam.  Head: Normocephalic and atraumatic. Eyes: EOMI, conjunctivae normal, no scleral icterus.  Neck: Supple, trachea midline, normal ROM,  JVD,  Cardiovascular: Sinus tachycardia, no murmurs,  Pulmonary/Chest: Lateral basal crackles. Abdominal: Soft, non-tender, non-distended, BS +,  Extremities: Trace lower extremity edema bilaterally,  pulses symmetric and intact bilaterally.  Neurological: A&O x3, Strength is normal and symmetric bilaterally, cranial nerve II-XII are grossly intact, no focal motor deficit, sensory intact  to light touch bilaterally.  Skin: Warm, dry and intact.  Psychiatric: Normal mood and affect. speech and behavior is normal. Cognition and memory are normal.  EKG: personally reviewed my interpretation is sinus tachycardia, old anterior infarct and J-point elevation in V2, V3 and V4.  CXR: personally reviewed my interpretation is sternotomy scar with pulmonary vascular congestion.  Assessment & Plan by Problem: Howard Thompson is a 53 y.o. gentleman with PMHx significant for CAD s/p LAD PCI twice, ischemic cardiomyopathy (EF 25-30%), history of cardiac arrest, GI bleed, myasthenia gravis s/p thymectomy, and polysubstance abuse which include tobacco, alcohol and cocaine came to ED with complaint of worsening fatigue and shortness of breath over the past week, last night he was unable to sleep and experiencing a lot of orthopnea and PND.  Active Problems:   GI bleeding Tricky situation as he developed GI bleeding with aspirin and Brilinta, quickly developed restenosis of his stent went off from antiplatelets.  Recent restenting in February 2020.  Ferritin and iron  studies are consistent with iron deficiency. GI was consulted-patient had the recommendations. -Will do capsule endoscopy. -Transfuse 1 unit of packed RBCs, will check hemoglobin after that and give her another unit if needed. -1 dose of Feraheme was given today. -Continue Protonix 40 mg twice daily. -Monitor CBC.  HFrEF.  Due to ischemic cardiomyopathy.  Heart failure exacerbation most likely due to anemia and GI blood loss. Currently elevated BNP at 553 with positive troponin most likely due to demand. Cardiology was consulted-appreciate their recommendations especially for antiplatelets. -Per cardiology they will discontinue aspirin and continue Brilinta. -Diurese with Lasix 40 mg daily. -Give him an extra dose of 40 after blood transfusion. -Continue home dose of carvedilol, losartan and Spironolactone. -Monitor daily weight. -Strict ins and outs. -Daily BMP.  History of polysubstance abuse.  Current UDS negative. Admits using cocaine little over a week ago.  Continue smoking although at a decreased quantity, he was given Wellbutrin during previous admission. -Continue Wellbutrin. -Need counseling for smoking and illicit drug use cessation.  H/O  myasthenia gravis.  Per patient his symptoms improved with thymectomy.  Not on any medication and does not want to be restarted. Occasionally get fatigued symptoms with diplopia and lower extremity weakness.  DVT prophylaxis.  Lovenox CODE STATUS.  Full Diet.  Clear liquid  Dispo: Admit patient to Inpatient with expected length of stay greater than 2 midnights.  SignedLorella Nimrod, MD 01/19/2019, 8:56 AM  Pager: [phone]

## 2019-01-19 NOTE — ED Notes (Signed)
Pt reports he has been wearing himself out this past week. His wife had his first baby delivered at [facility] and life has been very busy adjusting to a newborn at home.  States he has gotten very little sleep over the past week and when he tries to sleep, he experiences the breathing difficulties.

## 2019-01-19 NOTE — ED Notes (Signed)
Admitting provider at pt bedside. 

## 2019-01-19 NOTE — Consult Note (Addendum)
The patient has been seen in conjunction with Sande Rives, PA-C. All aspects of care have been considered and discussed. The patient has been personally interviewed, examined, and all clinical data has been reviewed.   Very difficult situation in this 53 year old gentleman, high risk CAD patient who has had proximal LAD stent thrombosis recently treated with repeat stent within the past 6 weeks.  The stent used was a long stent.  He returns now with repeat GI bleeding on aspirin and Brilinta as he had in September 2019.  He is at high risk for recurrent stent thrombosis if not adequately treated with potent antiplatelet therapy.  There is precedent for single agent use early after stent implantation, if a potent P2 Y 12 inhibitor is used.  Recommend discontinuation of aspirin but continuing ticagrelor.  If bleeding continues to be a problem, will have to downgrade to Plavix which may be ineffective at preventing stent thrombosis.  Currently on heart failure, likely contributed to by decreasing hemoglobin in the setting of underlying severe LV systolic dysfunction.  Currently still has significant neck vein elevation.  Would recommend IV Lasix as you are doing including an extra dose of furosemide 40 mg after transfusion of the first unit of blood.  Continue standard prehospital heart failure therapy: Carvedilol, losartan, spironolactone, and furosemide.    Cardiology Consult    Patient ID: Howard Thompson MRN: [id number], DOB/AGE: [birth year]   Admit date: 01/19/2019 Date of Consult: 01/19/2019  Primary Physician: Jamesetta Geralds, MD Primary Cardiologist: Previously seen at [facility], but would like to transition to Dr. Claiborne Billings. Requesting Provider: Lenice Pressman, MD  Patient Profile    Howard Thompson is a 53 y.o. male with a history of CAD with recent anterior STEMI in 12/16/2018 s/p DES to LAD, cardiac arrest in 04/2018 (due to ventricular fibrillation/STEMI), ischemic  cardiomyopathy, PAD s/p left femoral endarterectomy in 2016, GI bleeds requiring transfusion, polysubstance abuse, myasthenia gravis s/p thymectomy, suspected COPD, who was admitted for GI bleed with hemoglobin of 6.1 and positive Hemoccult. Cardiology consulted for assistance with antiplatelet therapy given recent PCI.  History of Present Illness    Mr. Crable is a 53 year old male with the above history who is followed by Dr. Claiborne Billings. He has a remote history of balloon angioplasty in [place] but more recently suffered an out-of-hospital cardiac arrest in 04/2018 due to ventricular fibrillation/STEMI. He underwent PCI with DES to the proximal LAD. He had a prolonged admission at that time due to cardiogenic shock/ischemic cardiomyopathy, GI bleed, acute renal failure requiring temporary dialysis, shock liver, acute encephalopathy, anemia, thrombocytopenia, and duodenitis. Initial EF was 25 to 30% but improved to 40 to 45 % closer to discharge so patient did not require a LifeVest or ICD at discharge. In 06/2018, patient was re-admitted with severe GI bleed (hemolgobin 4.4) with AVMs which were treated with argon plasma coagulation and clipping. Dual antiplatelet therapy with Aspirin and Brilinta was changed to Plavix monotherapy at that time. Patient declined to follow-up locally at discharge and requested to follow-up at [facility] where he was already established. At some point in the beginning of 2020, patient self-discontinued Plavix for unclear reasons. Patient admitted on 12/16/2018 with late-presenting anterior STEMI with over 30 hours of chest pain. He underwent left heart catheterization with total near ostial occlusion of the LAD and extensive thrombus burden s/p PCI with DES. He was treated with Aggrastat for heavy clot burden. He also had cardiogenic shock requiring intra-aortic balloon pump as well as elevated  LFTs felt to bue due to MI/transient shock. Patient does not appear to have followed-up with  Cardiology since that time.  Patient presented to the [facility] ED today with shortness of breath, weakness, and fatigue. He was found to have an acute GI bleed with hemoglobin of 6.1 and positive Hemoccult. Cardiology was consulted for assistance with antiplatelet therapy given recent cardiac stenting. EKG shows sinus tachycardia with evidence of old anterior infarct. Initial troponin minimally elevated at 0.04. BNP elevated at 553.9. Chest x-ray showed diffuse intersitial coarsening with Kerley B lines and cephalized blood flood.  Patient reports fatigue and weakness over the last week. His partner just had a baby and he has been running back and forth to the hospital so he contributed some of his symptoms to that. However, over the last couple of days he has noticed worsening shortness of breath first with exertion and then yesterday at rest. He states he feels like he has been "gasping for air" and last night he had significant orthopnea and PND. Patient reports some chest tightness that he attributes to not be able to get a full breath. He denies any real chest pain and states this feels very different than his previous cardiac pain. He reports some mild lightheadedness today but denies any dizziness or syncope. No recent fevers, chills, or body aches. He has a "smokers cough" but denies any recent changes. No recent travel or exposure to someone with COVID-19. He denies any hematochezia, hematuria, or hemoptysis. He reports he has taken all of his medications as directed since last hospitalization.   At the time of this evaluation, patient feels like he is breathing better after receiving Lasix and being put on supplemental O2 via nasal cannula.  Patient has a history of polysubstance abuse. He states he has smoked all his life. He had quit but recently restarted smoking. He is down to 5 cigaretttes a day after restarting Wellbutrin. He reports occasional alcohol use and states he last used cocaine  4-6 days ago. UDS negative on admission. Patient has a family history of heart disease with multiple aunts and uncles on both sides of his family dying of heart attacks (some younger than 52 years old). No known heart disease in mother or father.  Past Medical History   Past Medical History:  Diagnosis Date   Acute renal failure ([place])    a. a. during 7-05/2018 adm for cardiac arrest, requiring HD temporarily.   Alcohol abuse    Anemia    AVM (arteriovenous malformation) of colon    Cardiac arrest ([place]) 05/22/2018   Vfib   Cocaine abuse (HCC)    COPD (chronic obstructive pulmonary disease) (HCC)    Coronary artery disease    a. hx of POBA in [place]. b. out-of-hospital cardiac arrest 04/2018 (vfib/STEMI) s/p DES to prox LAD (complex admission with GIB, renal failure requiring HD, encephalopathy). c. STEMI 11/2018 after pt stopped Plavix - s/p DES to LAD.   Duodenitis    GI bleed    History of blood transfusion    Hyperlipidemia    Hypokalemia    Hypomagnesemia    Iron deficiency anemia    Ischemic cardiomyopathy    a. EF <25% at time of cath/arrest 04/2018, improved to 40-45% by f/u echo 06/09/18.   Myasthenia gravis ([place])    Myocardial infarction ([place]) 05/22/2018   STEMI   PAD (peripheral artery disease) ([place])    a. femoral bypass   Shock liver    a. during 7-05/2018  adm for cardiac arrest and again in 11/2018.   Thrombocytopenia ([place]) 04/2018   Tobacco abuse    Ventricular fibrillation [facility])     Past Surgical History:  Procedure Laterality Date   ATHERECTOMY     BIOPSY  07/16/2018   Procedure: BIOPSY;  Surgeon: Gatha Mayer, MD;  Location: [place];  Service: Endoscopy;;   CARDIAC CATHETERIZATION  05/22/2018   stent   COLONOSCOPY WITH PROPOFOL N/A 07/16/2018   Procedure: COLONOSCOPY WITH PROPOFOL;  Surgeon: Gatha Mayer, MD;  Location: [place];  Service: Endoscopy;  Laterality: N/A;   CORONARY ANGIOPLASTY     CORONARY STENT INTERVENTION  N/A 05/22/2018   Procedure: CORONARY STENT INTERVENTION;  Surgeon: Lorretta Harp, MD;  Location: [place] CV LAB;  Service: Cardiovascular;  Laterality: N/A;   CORONARY/GRAFT ACUTE MI REVASCULARIZATION N/A 12/16/2018   Procedure: CORONARY/GRAFT ACUTE MI REVASCULARIZATION;  Surgeon: Troy Sine, MD;  Location: [place] CV LAB;  Service: Cardiovascular;  Laterality: N/A;   ESOPHAGOGASTRODUODENOSCOPY (EGD) WITH PROPOFOL N/A 06/10/2018   Procedure: ESOPHAGOGASTRODUODENOSCOPY (EGD) WITH PROPOFOL;  Surgeon: Ladene Artist, MD;  Location: [facility] ENDOSCOPY;  Service: Endoscopy;  Laterality: N/A;   ESOPHAGOGASTRODUODENOSCOPY (EGD) WITH PROPOFOL N/A 07/16/2018   Procedure: ESOPHAGOGASTRODUODENOSCOPY (EGD) WITH PROPOFOL;  Surgeon: Gatha Mayer, MD;  Location: [place];  Service: Endoscopy;  Laterality: N/A;   FEMORAL ARTERY - POPLITEAL ARTERY BYPASS GRAFT     HOT HEMOSTASIS N/A 07/16/2018   Procedure: HOT HEMOSTASIS (ARGON PLASMA COAGULATION/BICAP);  Surgeon: Gatha Mayer, MD;  Location: [facility] ENDOSCOPY;  Service: Endoscopy;  Laterality: N/A;   IABP INSERTION N/A 12/16/2018   Procedure: IABP Insertion;  Surgeon: Troy Sine, MD;  Location: [place] CV LAB;  Service: Cardiovascular;  Laterality: N/A;   IR FLUORO GUIDE CV LINE RIGHT  05/31/2018   IR REMOVAL TUN CV CATH W/O FL  06/21/2018   IR US GUIDE VASC ACCESS RIGHT  05/31/2018   LEFT HEART CATH AND CORONARY ANGIOGRAPHY N/A 05/22/2018   Procedure: LEFT HEART CATH AND CORONARY ANGIOGRAPHY;  Surgeon: Lorretta Harp, MD;  Location: [place] CV LAB;  Service: Cardiovascular;  Laterality: N/A;   LEFT HEART CATH AND CORONARY ANGIOGRAPHY N/A 12/16/2018   Procedure: LEFT HEART CATH AND CORONARY ANGIOGRAPHY;  Surgeon: Troy Sine, MD;  Location: [place] CV LAB;  Service: Cardiovascular;  Laterality: N/A;     Allergies  No Known Allergies  Inpatient Medications     atorvastatin  80 mg Oral QPM   bisacodyl  10 mg Oral  Once   buPROPion  150 mg Oral BID   carvedilol  3.125 mg Oral BID WC   folic acid  1 mg Oral Daily   furosemide  40 mg Intravenous Daily   losartan  25 mg Oral Daily   [START ON 01/20/2019] metoCLOPramide (REGLAN) injection  10 mg Intravenous Once   pantoprazole  40 mg Oral BID   polyethylene glycol powder  0.5 Container Oral Once   spironolactone  12.5 mg Oral Daily    Family History    Family History  Problem Relation Age of Onset   Alzheimer's disease Father    He indicated that the status of his father is unknown.   Social History    Social History   Socioeconomic History   Marital status: Divorced    Spouse name: Not on file   Number of children: Not on file   Years of education: Not on file   Highest education level:  Not on file  Occupational History    Employer: [employer]  Social Needs   Financial resource strain: Not on file   Food insecurity:    Worry: Not on file    Inability: Not on file   Transportation needs:    Medical: Not on file    Non-medical: Not on file  Tobacco Use   Smoking status: Current Every Day Smoker    Packs/day: 1.00    Types: Cigarettes   Smokeless tobacco: Current User  Substance and Sexual Activity   Alcohol use: Yes    Comment: vague regarding current use   Drug use: Yes    Types: Cocaine, Marijuana, Methamphetamines, Benzodiazepines    Comment: vaguely discusses useage, but quickly changes subject   Sexual activity: Yes    Partners: Female  Lifestyle   Physical activity:    Days per week: Not on file    Minutes per session: Not on file   Stress: Not on file  Relationships   Social connections:    Talks on phone: Patient refused    Gets together: Patient refused    Attends religious service: Patient refused    Active member of club or organization: Patient refused    Attends meetings of clubs or organizations: Patient refused    Relationship status: Patient refused   Intimate  partner violence:    Fear of current or ex partner: No    Emotionally abused: No    Physically abused: No    Forced sexual activity: No  Other Topics Concern   Not on file  Social History Narrative   Divorced   Employed at [employer]   Polysubstance abuse     Review of Systems    Review of Systems  Constitutional: Positive for malaise/fatigue. Negative for chills and fever.  HENT: Negative for congestion.   Respiratory: Positive for cough and shortness of breath. Negative for hemoptysis and sputum production.   Cardiovascular: Positive for orthopnea and PND. Negative for chest pain, palpitations and leg swelling.  Gastrointestinal: Negative for abdominal pain, blood in stool, nausea and vomiting.  Genitourinary: Negative for hematuria.  Musculoskeletal: Negative for myalgias.  Neurological: Positive for weakness. Negative for dizziness.  Endo/Heme/Allergies: Does not bruise/bleed easily.  Psychiatric/Behavioral: Positive for substance abuse.    Physical Exam    Blood pressure 114/81, pulse (!) 113, temperature (!) 97.3 F (36.3 C), temperature source Oral, resp. rate 18, height 5\' 9"  (1.753 m), weight 74.8 kg, SpO2 100 %.  General: 53 y.o. male resting comfortably in no acute distress. Pleasant and cooperative. Currently on supplemental O2 via nasal cannula. HEENT: Normal  Neck: Supple. Elevated JVD. No carotid bruits. Lungs: No increased work of breathing. Few faint scattered crackles in bases but lungs relatively clear.  Heart: Tachycardic with regular rate. Distinct S1 and S2. II/VI systolic murmur best heard at apex. No rubs or gallops.  Abdomen: Soft, non-distended, and non-tender to palpation. Bowel sounds present in all 4 quadrants.   Extremities: No clubbing, cyanosis or edema. Radial and posterior tibial pulses 2+ and equal bilaterally.  Skin: Warm and dry. Neuro: Alert and oriented x3. No focal deficits. Moves all extremities spontaneously. Psych:  Normal affect. Responds appropriately.  Labs    Troponin (Point of Care Test) No results for input(s): TROPIPOC in the last 72 hours. Recent Labs    01/19/19 0556  TROPONINI 0.04*   Lab Results  Component Value Date   WBC 9.1 01/19/2019   HGB 6.1 (LL)  01/19/2019   HCT 22.3 (L) 01/19/2019   MCV 76.9 (L) 01/19/2019   PLT 464 (H) 01/19/2019    Recent Labs  Lab 01/19/19 0556  NA 134*  K 4.3  CL 104  CO2 21*  BUN 18  CREATININE 1.07  CALCIUM 9.1  GLUCOSE 114*   Lab Results  Component Value Date   CHOL 144 12/17/2018   HDL 40 (L) 12/17/2018   LDLCALC 90 12/17/2018   TRIG 70 12/17/2018   No results found for: [facility]   Radiology Studies    Dg Chest 2 View  Result Date: 01/19/2019 CLINICAL DATA:  Shortness of breath EXAM: CHEST - 2 VIEW COMPARISON:  12/18/2018 FINDINGS: Normal heart size. Prior median sternotomy with history of CABG. There is diffuse interstitial coarsening with Kerley lines and cephalized blood flow. No pleural effusion. Borderline hyperinflation. IMPRESSION: CHF pattern. Electronically Signed   By: Monte Fantasia M.D.   On: 01/19/2019 06:21    EKG     EKG: EKG was personally reviewed and demonstrates: Sinus tachycardia, rate 120 bpm, with evidence of old infarct in anterior leads with elevated J point and resolving ST elevation in leads V2 through V4.   Telemetry: Telemetry was personally reviewed and demonstrates: Sinus tachycardia with rates in the 110's to 120's.  Cardiac Imaging    Cardiac Catheterization 12/16/2018:  Ost LAD to Prox LAD lesion is 100% stenosed.  Post intervention, there is a 0% residual stenosis.  A stent was successfully placed.   Acute/Subacute thrombotic occlusion of the previously placed  3.0 x 20 mm Synergy  stent in the proximal LAD with clot extending almost to the LAD ostium with chest pain duration of at least 30 hours.  There was extensive recalcitrant thrombotic burden which persisted despite Angiomax,  Aggrastat, multiple runs of Pronto thrombectomy, intracoronary nitroglycerin, intracoronary adenosine, distal infusion of adenosine, with ultimate complete resolution after insertion of an intra-aortic balloon pump for element of early cardiogenic shock and successful DES stenting of the LAD extending from the ostium to the mid vessel with insertion of a 3.5 x 38 mm Resolute stent postdilated to 3.75 mm with the 100% occlusion being reduced to 0% and after 3-1/2-hour procedure, ultimate reestablishment of brisk TIMI-3 flow antegrade down the LAD system which extends around the LV apex.  Normal left circumflex coronary artery.  Normal dominant RCA with initial faint collateralization of the LAD system via the distal RCA.  LVEDP at the start of the procedure was 30 mmHg.  Aggressive diuresis following Lasix 40 mg with ultimate 2.8 L of urine output during the procedure.  Recommendations: Continue Aggrastat for minimum of 18 hours post procedure.  Bivalirudin will be discontinued 2 hours post procedure.  We will plan to keep the intra-aortic balloon pump one-to-one today.  Obtain follow-up 2D echo Doppler study.  Recommend long-term dual antiplatelet therapy as long as there is no recurrent GI bleeds.  Will assess drug screen with remote history of cocaine use.  Smoking cessation is essential.  Aggressive lipid-lowering therapy with target LDL less than 70. _______________  Echocardiogram 12/16/2018: Impressions:  1. The left ventricle has severely reduced systolic function, with an ejection fraction of 25-30%. The cavity size was normal. Left ventricular diastolic Doppler parameters are consistent with impaired relaxation.  2. Severe hypokinesis of the left ventricular anteroseptal wall, inferoseptal wall and apical segment.  3. No thrombus.  4. The right ventricle has normal systolic function. The cavity was normal. There is no increase in right ventricular wall  thickness.  5. The tricuspid  valve is normal in structure.  6. The aortic valve is tricuspid Mild thickening of the aortic valve Mild calcification of the aortic valve. Aortic valve regurgitation is trivial by color flow Doppler.  7. The pulmonic valve was normal in structure.  8. The mitral valve is normal in structure.  Assessment & Plan   Acute GI Bleed - Patient presented with dyspnea, fatigue, and weakness and was found to have an acute GI bleed with hemoglobin of 6.1 and positive hemoccult. She has currently received 1 unit of of transfused PRBCs. - Discussed with MD. OK to discontinue Aspirin but would like to continue Brilinta if possible due recent STEMI due to re-stent stenosis after medication non-compliance with Plavix. - Management per GI and primary team.  CAD with Recent STEMI s/p DES to LAD - Currently on dual antiplatelet therapy with Aspirin and Brilinta. Will discontinue Aspirin due to acute GI bleed but would like to continue Brilinta at this time if possible. - Continue aggressive secondary prevention with beta-blocker and high-intensity statin.  Acute Systolic CHF / Ischemic Cardiomyopathy - Echo from 12/16/2018 showed LVEF of 25-30% with severe hypokinesis of the left ventricular anteroseptal wall, inferoseptal wall, and apical segment. - BNP elevated at 553.9 on arrival. - Chest x-ray consistent with CHF. - Patient received one dose of IV Lasix 40mg  in the ED and with some improvement of symptom. Documented urine output of 1.5 L since then. Renal function normal.  - Continue IV Lasix 40mg  daily.  - Continue Coreg 3.125 twice daily, Losartan 25mg  daily, and Spironolactone 25mg  daily. - Continue to monitor daily weights, strict I/O's, and renal function.  Elevated Troponin - Initial troponin minimally elevated at 0.04.  - EKG shows sinus tachycardia with Sinus tachycardia, rate 120 bpm, with evidence of old infarct in anterior leads with elevated J point and resolving ST elevation in leads V2  through V4.  - Patient notes some mild chest tightness from not be able to take a full breath. He denies any real chest pain and states this feels very different than his previous cardiac pain. - Likely demand ischemia due to significant anemia and acute systolic CHF. No ischemic work-up planned at this time.  History of Polysubstance Abuse - UDS screen negative this admission. UDS has been positive for cocaine, benzodiazepines, amphetamines, and THC.  - Patient reports he last used cocaine 4-6 days ago.  - Patient recently restarted smoking and was up to 1 pack per day. However, he restarted Wellbutrin and is now smoking about 5 cigarettes per day. - Complete cessation is advised.  Otherwise, per primary team.  Signed, Darreld Mclean, PA-C 01/19/2019, 10:54 AM  For questions or updates, please contact   Please consult [email or web address] for contact info under Cardiology/STEMI.

## 2019-01-19 NOTE — Progress Notes (Signed)
Per Dr. Margaretann Loveless, the echocardiogram will not change medical treatment of supportive care.

## 2019-01-19 NOTE — ED Notes (Signed)
Informed provider of critical lab

## 2019-01-19 NOTE — ED Notes (Addendum)
Critical value received from [facility] in lab: Troponin 0.04

## 2019-01-19 NOTE — ED Notes (Signed)
Pt to Xray.

## 2019-01-19 NOTE — ED Triage Notes (Signed)
Pt appears SOB and having "heaviness" in his chest, starting yesterday.  Worse when trying to sleep, "I wake up starving for air."  Slight bilateral swelling, in the past week, SOB upon exertion.  Pt reports he tried a nitro last night however it did not work.

## 2019-01-19 NOTE — ED Provider Notes (Signed)
[facility] EMERGENCY DEPARTMENT Provider Note   CSN: [id number] Arrival date & time: 01/19/19  0544    History   Chief Complaint Chief Complaint  Patient presents with  . Shortness of Breath  . Chest Pain    HPI Howard Thompson is a 53 y.o. male.  The history is provided by the patient.  Shortness of Breath  Associated symptoms: chest pain   Chest Pain  Associated symptoms: shortness of breath   He has history of hyperlipidemia, myocardial infarction, ischemic cardiomyopathy, GI bleed and comes in because of dyspnea which started last night.  Dyspnea is worse when he lays flat.  He states that this is very different from the angina pain he had.  He did have a stent placed about 1 month ago which replaced another stent which had closed off.  For the past week, he has noted that he got very weak and fatigued if he walked any significant distance.  He denies any chest pain, heaviness, tightness, pressure.  He has not noted any leg swelling.  Past Medical History:  Diagnosis Date  . Acute renal failure ([place])    a. a. during 7-05/2018 adm for cardiac arrest, requiring HD temporarily.  . Alcohol abuse   . Anemia   . AVM (arteriovenous malformation) of colon   . Cardiac arrest ([place]) 05/22/2018   Vfib  . Cocaine abuse ([place])   . COPD (chronic obstructive pulmonary disease) ([place])   . Coronary artery disease    a. hx of POBA in [place]. b. out-of-hospital cardiac arrest 04/2018 (vfib/STEMI) s/p DES to prox LAD (complex admission with GIB, renal failure requiring HD, encephalopathy). c. STEMI 11/2018 after pt stopped Plavix - s/p DES to LAD.  Marland Kitchen Duodenitis   . GI bleed   . History of blood transfusion   . Hyperlipidemia   . Hypokalemia   . Hypomagnesemia   . Iron deficiency anemia   . Ischemic cardiomyopathy    a. EF <25% at time of cath/arrest 04/2018, improved to 40-45% by f/u echo 06/09/18.  . Myasthenia gravis ([place])   . Myocardial infarction ([place]) 05/22/2018   STEMI  .  PAD (peripheral artery disease) ([place])    a. femoral bypass  . Shock liver    a. during 7-05/2018 adm for cardiac arrest and again in 11/2018.  Marland Kitchen Thrombocytopenia ([place]) 04/2018  . Tobacco abuse   . Ventricular fibrillation [facility])     Patient Active Problem List   Diagnosis Date Noted  . Coronary stent thrombosis 12/20/2018  . Shock liver 12/20/2018  . History of GI bleed 12/20/2018  . Iron deficiency anemia 12/20/2018  . Hypokalemia 12/20/2018  . Hypomagnesemia 12/20/2018  . CAD in native artery 12/20/2018  . STEMI (ST elevation myocardial infarction) ([place]) 12/16/2018  . Acute blood loss anemia   . Long term current use of antithrombotics/antiplatelets   . Symptomatic anemia 07/14/2018  . Protein-calorie malnutrition, severe 06/11/2018  . Melena   . Heme + stool   . Anemia due to chronic blood loss   . Hyperlipidemia LDL goal <70   . Acute respiratory failure ([place])   . AKI (acute kidney injury) ([place])   . Ischemic cardiomyopathy   . Encephalopathy acute   . History of ETT   . Thrombocytopenia ([place])   . Substance abuse ([place])   . Cocaine abuse ([place])   . Shock ([place])   . Elevated LFTs   . Acute renal failure ([place])   . Ventricular fibrillation ([place])   .  Cardiac arrest ([place])   . ACS (acute coronary syndrome) ([place]) 05/22/2018    Past Surgical History:  Procedure Laterality Date  . ATHERECTOMY    . BIOPSY  07/16/2018   Procedure: BIOPSY;  Surgeon: Gatha Mayer, MD;  Location: [facility] ENDOSCOPY;  Service: Endoscopy;;  . CARDIAC CATHETERIZATION  05/22/2018   stent  . COLONOSCOPY WITH PROPOFOL N/A 07/16/2018   Procedure: COLONOSCOPY WITH PROPOFOL;  Surgeon: Gatha Mayer, MD;  Location: [facility] ENDOSCOPY;  Service: Endoscopy;  Laterality: N/A;  . CORONARY ANGIOPLASTY    . CORONARY STENT INTERVENTION N/A 05/22/2018   Procedure: CORONARY STENT INTERVENTION;  Surgeon: Lorretta Harp, MD;  Location: [facility] CV LAB;  Service: Cardiovascular;  Laterality: N/A;  . CORONARY/GRAFT ACUTE  MI REVASCULARIZATION N/A 12/16/2018   Procedure: CORONARY/GRAFT ACUTE MI REVASCULARIZATION;  Surgeon: Troy Sine, MD;  Location: [facility] CV LAB;  Service: Cardiovascular;  Laterality: N/A;  . ESOPHAGOGASTRODUODENOSCOPY (EGD) WITH PROPOFOL N/A 06/10/2018   Procedure: ESOPHAGOGASTRODUODENOSCOPY (EGD) WITH PROPOFOL;  Surgeon: Ladene Artist, MD;  Location: [facility] ENDOSCOPY;  Service: Endoscopy;  Laterality: N/A;  . ESOPHAGOGASTRODUODENOSCOPY (EGD) WITH PROPOFOL N/A 07/16/2018   Procedure: ESOPHAGOGASTRODUODENOSCOPY (EGD) WITH PROPOFOL;  Surgeon: Gatha Mayer, MD;  Location: [facility];  Service: Endoscopy;  Laterality: N/A;  . FEMORAL ARTERY - POPLITEAL ARTERY BYPASS GRAFT    . HOT HEMOSTASIS N/A 07/16/2018   Procedure: HOT HEMOSTASIS (ARGON PLASMA COAGULATION/BICAP);  Surgeon: Gatha Mayer, MD;  Location: [facility] ENDOSCOPY;  Service: Endoscopy;  Laterality: N/A;  . IABP INSERTION N/A 12/16/2018   Procedure: IABP Insertion;  Surgeon: Troy Sine, MD;  Location: [facility] CV LAB;  Service: Cardiovascular;  Laterality: N/A;  . IR FLUORO GUIDE CV LINE RIGHT  05/31/2018  . IR REMOVAL TUN CV CATH W/O FL  06/21/2018  . IR US GUIDE VASC ACCESS RIGHT  05/31/2018  . LEFT HEART CATH AND CORONARY ANGIOGRAPHY N/A 05/22/2018   Procedure: LEFT HEART CATH AND CORONARY ANGIOGRAPHY;  Surgeon: Lorretta Harp, MD;  Location: [facility] CV LAB;  Service: Cardiovascular;  Laterality: N/A;  . LEFT HEART CATH AND CORONARY ANGIOGRAPHY N/A 12/16/2018   Procedure: LEFT HEART CATH AND CORONARY ANGIOGRAPHY;  Surgeon: Troy Sine, MD;  Location: [facility] CV LAB;  Service: Cardiovascular;  Laterality: N/A;        Home Medications    Prior to Admission medications   Medication Sig Start Date End Date Taking? Authorizing Provider  aspirin EC 81 MG tablet Take 81 mg by mouth daily.    [provider]  atorvastatin (LIPITOR) 80 MG tablet Take 1 tablet (80 mg total) by mouth every evening. 12/20/18    Dunn, Nedra Hai, PA-C  buPROPion (WELLBUTRIN SR) 150 MG 12 hr tablet Take 1 tablet (150 mg total) by mouth 2 (two) times daily. 12/20/18   Dunn, Nedra Hai, PA-C  carvedilol (COREG) 3.125 MG tablet Take 1 tablet (3.125 mg total) by mouth 2 (two) times daily with a meal. 12/20/18   Dunn, Nedra Hai, PA-C  folic acid (FOLVITE) 1 MG tablet Take 1 tablet (1 mg total) by mouth daily. 07/17/18   Hosie Poisson, MD  losartan (COZAAR) 25 MG tablet Take 1 tablet (25 mg total) by mouth daily. 12/21/18   Dunn, Nedra Hai, PA-C  Multiple Vitamin (MULTIVITAMIN WITH MINERALS) TABS tablet Take 1 tablet by mouth daily. 07/17/18   Hosie Poisson, MD  nitroGLYCERIN (NITROSTAT) 0.4 MG SL tablet Place 0.4 mg under the tongue every 5 (five)  minutes as needed for chest pain.    [provider]  pantoprazole (PROTONIX) 40 MG tablet Take 1 tablet (40 mg total) by mouth 2 (two) times daily. 12/20/18   Dunn, Nedra Hai, PA-C  potassium chloride SA (K-DUR,KLOR-CON) 20 MEQ tablet Take 1 tablet (20 mEq total) by mouth daily. 12/21/18   Dunn, Nedra Hai, PA-C  spironolactone (ALDACTONE) 25 MG tablet Take 0.5 tablets (12.5 mg total) by mouth daily. 12/21/18   Dunn, Nedra Hai, PA-C  ticagrelor (BRILINTA) 90 MG TABS tablet Take 1 tablet (90 mg total) by mouth 2 (two) times daily. 12/20/18   Charlie Pitter, PA-C    Family History Family History  Problem Relation Age of Onset  . Alzheimer's disease Father     Social History Social History   Tobacco Use  . Smoking status: Current Every Day Smoker    Packs/day: 1.00    Types: Cigarettes  . Smokeless tobacco: Current User  Substance Use Topics  . Alcohol use: Yes    Comment: vague regarding current use  . Drug use: Yes    Types: Cocaine, Marijuana, Methamphetamines, Benzodiazepines    Comment: vaguely discusses useage, but quickly changes subject     Allergies   Patient has no known allergies.   Review of Systems Review of Systems  Respiratory: Positive for shortness of breath.    Cardiovascular: Positive for chest pain.  All other systems reviewed and are negative.    Physical Exam Updated Vital Signs BP (!) 133/92 (BP Location: Right Arm)   Pulse (!) 112   Temp 98.6 F (37 C) (Oral)   Resp 20   Ht 5\' 9"  (1.753 m)   Wt 74.8 kg   SpO2 98%   BMI 24.37 kg/m   Physical Exam Vitals signs and nursing note reviewed.    53 year old male, resting comfortably and in no acute distress. Vital signs are significant for borderline hypertension and rapid heart rate. Oxygen saturation is 98%, which is normal. Head is normocephalic and atraumatic. PERRLA, EOMI. Oropharynx is clear. Neck is nontender and supple without adenopathy or JVD. Back is nontender and there is no CVA tenderness.  There is trace presacral edema. Lungs have faint bibasilar rales without wheezes or rhonchi. Chest is nontender. Heart has regular rate and rhythm without murmur. Abdomen is soft, flat, nontender without masses or hepatosplenomegaly and peristalsis is normoactive. Rectal: Normal sphincter tone, normal size prostate.  Stool is light brown. Extremities have 1+ pretibial edema, full range of motion is present. Skin is warm and dry without rash. Neurologic: Mental status is normal, cranial nerves are intact, there are no motor or sensory deficits.  ED Treatments / Results  Labs (all labs ordered are listed, but only abnormal results are displayed) Labs Reviewed  CBC WITH DIFFERENTIAL/PLATELET - Abnormal; Notable for the following components:      Result Value   RBC 2.90 (*)    Hemoglobin 6.1 (*)    HCT 22.3 (*)    MCV 76.9 (*)    MCH 21.0 (*)    MCHC 27.4 (*)    RDW 20.9 (*)    Platelets 464 (*)    nRBC 0.5 (*)    All other components within normal limits  BASIC METABOLIC PANEL  BRAIN NATRIURETIC PEPTIDE  TROPONIN I  POC OCCULT BLOOD, ED  PREPARE RBC (CROSSMATCH)    EKG EKG Interpretation  Date/Time:  Wednesday January 19 2019 05:53:02 EDT Ventricular Rate:  120 PR  Interval:  QRS Duration: 87 QT Interval:  306 QTC Calculation: 433 R Axis:   71 Text Interpretation:  Sinus tachycardia Probable left atrial enlargement Anterior infarct, old Baseline wander in lead(s) V2 V3 V4 V5 When compared with ECG of 12/18/2018, HEART RATE has increased Confirmed by Delora Fuel ([id number]) on 01/19/2019 6:08:00 AM   Radiology Dg Chest 2 View  Result Date: 01/19/2019 CLINICAL DATA:  Shortness of breath EXAM: CHEST - 2 VIEW COMPARISON:  12/18/2018 FINDINGS: Normal heart size. Prior median sternotomy with history of CABG. There is diffuse interstitial coarsening with Kerley lines and cephalized blood flow. No pleural effusion. Borderline hyperinflation. IMPRESSION: CHF pattern. Electronically Signed   By: Monte Fantasia M.D.   On: 01/19/2019 06:21    Procedures Procedures  CRITICAL CARE Performed by: Delora Fuel Total critical care time: 65 minutes Critical care time was exclusive of separately billable procedures and treating other patients. Critical care was necessary to treat or prevent imminent or life-threatening deterioration. Critical care was time spent personally by me on the following activities: development of treatment plan with patient and/or surrogate as well as nursing, discussions with consultants, evaluation of patient's response to treatment, examination of patient, obtaining history from patient or surrogate, ordering and performing treatments and interventions, ordering and review of laboratory studies, ordering and review of radiographic studies, pulse oximetry and re-evaluation of patient's condition.  Medications Ordered in ED Medications  furosemide (LASIX) injection 40 mg (has no administration in time range)  0.9 %  sodium chloride infusion (has no administration in time range)  aspirin chewable tablet 324 mg (324 mg Oral Given 01/19/19 0618)     Initial Impression / Assessment and Plan / ED Course  I have reviewed the triage vital signs and  the nursing notes.  Pertinent labs & imaging results that were available during my care of the patient were reviewed by me and considered in my medical decision making (see chart for details).  Dyspnea with presence of peripheral edema strongly suggestive of heart failure.  No cough to suggest pneumonia.  Doubt pulmonary embolism.  Old records are reviewed, and he was noted to have a 25% ejection fraction at that time of stent placement last month.  Will check screening labs including BNP as well as chest x-ray and ECG.  ECG shows sinus tachycardia but no acute process.  Chest x-ray shows some pulmonary vascular congestion consistent with heart failure.  Hemoglobin has come back at 6.1 with slightly microcytic RBC indices.  Rectal exam was done showing tan-colored stool which was Hemoccult positive.  This, along with his low ejection fraction, accounts for his fatigue and dyspnea.  Blood is ordered for transfusion.  Further review of old records shows during hospitalization for GI bleed, he had an EGD which showed a single nonbleeding angiodysplastic lesion to do him treated with argon plasma coagulation, and had colonoscopy showing a flat polyp in the ascending colon.  Remainder of his labs are significant only for elevated BNP consistent with heart failure.  Case is discussed with Dr. Reesa Chew of internal medicine teaching service who agrees to admit the patient.  Final Clinical Impressions(s) / ED Diagnoses   Final diagnoses:  Gastrointestinal hemorrhage, unspecified gastrointestinal hemorrhage type  Acute combined systolic and diastolic heart failure ([place])  Anemia due to blood loss    ED Discharge Orders    None       Delora Fuel, MD 98/26/41 [phone]

## 2019-01-19 NOTE — ED Notes (Signed)
Pt back from CT

## 2019-01-20 ENCOUNTER — Encounter (HOSPITAL_COMMUNITY): Payer: Self-pay | Admitting: *Deleted

## 2019-01-20 ENCOUNTER — Encounter (HOSPITAL_COMMUNITY)
Admission: EM | Disposition: A | Discharge status: Home / Self Care | Payer: Self-pay | Source: Home / Self Care | Attending: Student in an Organized Health Care Education/Training Program

## 2019-01-20 HISTORY — PX: GIVENS CAPSULE STUDY: SHX5432

## 2019-01-20 LAB — HEMOGLOBIN AND HEMATOCRIT, BLOOD
HCT: 27.5 % — ABNORMAL LOW (ref 39.0–52.0)
Hemoglobin: 8.4 g/dL — ABNORMAL LOW (ref 13.0–17.0)

## 2019-01-20 LAB — CBC
HCT: 20.7 % — ABNORMAL LOW (ref 39.0–52.0)
Hemoglobin: 6.5 g/dL — CL (ref 13.0–17.0)
MCH: 23.4 pg — ABNORMAL LOW (ref 26.0–34.0)
MCHC: 31.4 g/dL (ref 30.0–36.0)
MCV: 74.5 fL — ABNORMAL LOW (ref 80.0–100.0)
Platelets: 403 10*3/uL — ABNORMAL HIGH (ref 150–400)
RBC: 2.78 MIL/uL — ABNORMAL LOW (ref 4.22–5.81)
RDW: 20.1 % — ABNORMAL HIGH (ref 11.5–15.5)
WBC: 4.8 10*3/uL (ref 4.0–10.5)
nRBC: 0.8 % — ABNORMAL HIGH (ref 0.0–0.2)

## 2019-01-20 LAB — BASIC METABOLIC PANEL
Anion gap: 8 (ref 5–15)
BUN: 12 mg/dL (ref 6–20)
CALCIUM: 8.7 mg/dL — AB (ref 8.9–10.3)
CO2: 25 mmol/L (ref 22–32)
Chloride: 104 mmol/L (ref 98–111)
Creatinine, Ser: 1.05 mg/dL (ref 0.61–1.24)
GFR calc Af Amer: >60 mL/min (ref >60)
GFR calc non Af Amer: >60 mL/min (ref >60)
Glucose, Bld: 92 mg/dL (ref 70–99)
Potassium: 3.2 mmol/L — ABNORMAL LOW (ref 3.5–5.1)
Sodium: 137 mmol/L (ref 135–145)

## 2019-01-20 LAB — PREPARE RBC (CROSSMATCH)

## 2019-01-20 LAB — GLUCOSE, CAPILLARY
Glucose-Capillary: 99 mg/dL (ref 70–99)
Glucose-Capillary: 112 mg/dL — ABNORMAL HIGH (ref 70–99)

## 2019-01-20 SURGERY — IMAGING PROCEDURE, GI TRACT, INTRALUMINAL, VIA CAPSULE

## 2019-01-20 MED ORDER — SODIUM CHLORIDE 0.9% IV SOLUTION
Freq: Once | INTRAVENOUS | Status: AC
  Administered 2019-01-20: 06:00:00 via INTRAVENOUS

## 2019-01-20 MED ORDER — FERROUS SULFATE 325 (65 FE) MG PO TABS
325.0000 mg | ORAL_TABLET | Freq: Two times a day (BID) | ORAL | Status: DC
  Administered 2019-01-20 – 2019-01-21 (×2): 325 mg via ORAL
  Filled 2019-01-20 (×2): qty 1

## 2019-01-20 MED ORDER — POTASSIUM CHLORIDE CRYS ER 20 MEQ PO TBCR
40.0000 meq | EXTENDED_RELEASE_TABLET | Freq: Two times a day (BID) | ORAL | Status: DC
  Administered 2019-01-20 – 2019-01-22 (×5): 40 meq via ORAL
  Filled 2019-01-20 (×4): qty 2
  Filled 2019-01-20: qty 4

## 2019-01-20 NOTE — Progress Notes (Addendum)
   Subjective: Mr Howard Thompson reports feeling well this morning. He states he has been having normal BM, 1-2 a day, but noticed dark stool for the past week. Denies abdominal pain, n/v. No acute complaints.   Objective:  Vital signs in last 24 hours: Vitals:   01/20/19 0426 01/20/19 0615 01/20/19 0652 01/20/19 0906  BP: 108/69 97/61 125/76 121/74  Pulse: 87 97 100 (!) 107  Resp: 18 18 17 18   Temp: 98.6 F (37 C) 97.7 F (36.5 C) 98.1 F (36.7 C) 98.5 F (36.9 C)  TempSrc: Oral Oral Oral Oral  SpO2: 93% 97% 97% 99%  Weight: 71.6 kg     Height:       Physical Exam Gen: seen laying in bed, no distress CV: RRR, no murmurs Pulm: CTA bilaterally, no wheezing Ext: no edema, warm and well perfused  Assessment/Plan:  Active Problems:   GI bleeding  Howard Thompson a 53 y.o. gentleman with PMHx significant for CAD s/p LAD PCI twice, ischemic cardiomyopathy (EF 25-30%), history of cardiac arrest, GI bleed, myasthenia gravis s/p thymectomy, and polysubstance abuse which include tobacco, alcohol and cocaine came to ED with complaint of worsening fatigue and shortness of breath over the past week, last night he was unable to sleep and experiencing a lot of orthopnea and PND.  GI bleeding: Patient has a history of duodenitis and duodenal AVM which was eradicated with APC and endo clip. He has been given a total of 3 units of PRBC's this admission. Suspect bleeding 2/2 recurrent AVM's. He had instent restenosis/thrombosis after stopping plavix. GI plans to do a capsule endoscopy today. - GI on board, appreciate recommendations - Capsule endoscopy today  - Continue Protonix 40 mg twice daily - Monitor CBC - Transfuse if Hgb < 7.0   HFrEF: Due to ischemic cardiomyopathy.  Heart failure exacerbation most likely due to anemia and GI blood loss. Given lasix 40 mg this morning after blood transfusion - Cardiology on board, appreciate recommendations - Per cardiology, discontinue aspirin and continue  Brilinta - Diurese with Lasix 40 mg daily - Continue home dose of carvedilol, losartan and Spironolactone - Monitor daily weight - Strict ins and outs - Daily BMP  Dispo: Anticipated discharge pending clinical improvement.   [name], DO 01/20/2019, 9:29 AM Pager: [phone]

## 2019-01-20 NOTE — Progress Notes (Signed)
Tolerated capsule well without difficulty.

## 2019-01-20 NOTE — Progress Notes (Signed)
   Vital Signs MEWS/VS Documentation      01/20/2019 0906 01/20/2019 1021 01/20/2019 1122 01/20/2019 1658   MEWS Score:  1  1  1  2    MEWS Score Color:  Green  Green  Green  Yellow   Resp:  18  -  18  -   Pulse:  (!) 107  (!) 102  82  (!) 113   BP:  121/74  113/73  (!) 95/58  130/80   Temp:  98.5 F (36.9 C)  -  98.1 F (36.7 C)  -   O2 Device:  Room Air  -  Room Air  -           Tristan Schroeder 01/20/2019,5:04 PM

## 2019-01-20 NOTE — Progress Notes (Addendum)
Daily Rounding Note  01/20/2019, 9:20 AM  LOS: 1 day   SUBJECTIVE:   Chief complaint: Iron def anemia.  FOBT +      Loose, brown stool this AM.  Capsule study in progress, starting 0830 today. Wondering when he can get solid food.   Som SOB with exertion.   OBJECTIVE:         Vital signs in last 24 hours:    Temp:  [97.3 F (36.3 C)-99.4 F (37.4 C)] 98.5 F (36.9 C) (03/26 0906) Pulse Rate:  [87-113] 107 (03/26 0906) Resp:  [13-28] 18 (03/26 0906) BP: (97-131)/(61-118) 121/74 (03/26 0906) SpO2:  [93 %-100 %] 99 % (03/26 0906) Weight:  [71.6 kg-74.9 kg] 71.6 kg (03/26 0426) Last BM Date: 01/19/19 Filed Weights   01/19/19 0555 01/19/19 1041 01/20/19 0426  Weight: 74.8 kg 74.9 kg 71.6 kg   General: looks well.  NAD   Heart: RRR Chest: clear bil.   Abdomen: active BS, NT, ND  Extremities: no CCE  Neuro/Psych:  Oriented x 3.  No weakness, no tremors.    Intake/Output from previous day: 03/25 0701 - 03/26 0700 In: 1019 [P.O.:240; Blood:679; IV Piggyback:100] Out: 5300 [Urine:5300]  Intake/Output this shift: No intake/output data recorded.  Lab Results: Recent Labs    01/19/19 0556 01/19/19 1639 01/20/19 0321  WBC 9.1  --  4.8  HGB 6.1* 7.0* 6.5*  HCT 22.3* 22.3* 20.7*  PLT 464*  --  403*   BMET Recent Labs    01/19/19 0556 01/20/19 0321  NA 134* 137  K 4.3 3.2*  CL 104 104  CO2 21* 25  GLUCOSE 114* 92  BUN 18 12  CREATININE 1.07 1.05  CALCIUM 9.1 8.7*   LFT No results for input(s): PROT, ALBUMIN, AST, ALT, ALKPHOS, BILITOT, BILIDIR, IBILI in the last 72 hours. PT/INR No results for input(s): LABPROT, INR in the last 72 hours. Hepatitis Panel No results for input(s): HEPBSAG, HCVAB, HEPAIGM, HEPBIGM in the last 72 hours.  Studies/Results: Dg Chest 2 View  Result Date: 01/19/2019 CLINICAL DATA:  Shortness of breath EXAM: CHEST - 2 VIEW COMPARISON:  12/18/2018 FINDINGS: Normal heart  size. Prior median sternotomy with history of CABG. There is diffuse interstitial coarsening with Kerley lines and cephalized blood flow. No pleural effusion. Borderline hyperinflation. IMPRESSION: CHF pattern. Electronically Signed   By: Monte Fantasia M.D.   On: 01/19/2019 06:21   Scheduled Meds:  atorvastatin  80 mg Oral QPM   buPROPion  150 mg Oral BID   carvedilol  3.125 mg Oral BID WC   folic acid  1 mg Oral Daily   furosemide  40 mg Intravenous Daily   losartan  25 mg Oral Daily   metoCLOPramide (REGLAN) injection  10 mg Intravenous Once   pantoprazole  40 mg Oral BID   potassium chloride  40 mEq Oral BID   spironolactone  12.5 mg Oral Daily   ticagrelor  90 mg Oral BID   Continuous Infusions:  sodium chloride     PRN Meds:.acetaminophen **OR** acetaminophen, ondansetron **OR** ondansetron (ZOFRAN) IV   ASSESMENT:   *   Anemia, FOBT positive on Brilinta/81 ASA.  GI bleeding and ABL anemia August and September 2019.  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  Hyperplastic ascending colon polyp. Compliant with twice daily Protonix Suspect bleeding due to recurrent AVMs. PRBC x 1, Hgb 6.1 >> 6.5. PRBC # 2 transfusing.      *  11/2018 STEMI, instent restenosis/thrombosis after running out of Plavix, s/p DES to LAD.  Brilinta, low-dose aspirin restarted after this.  Brilinta not on hold. CHF currently by CXR an elevated BNP, suspect anemia contributing.        PLAN   *  12 hour Capsule endoscopy in progress since 0830 today.  Plan to read this tomorrow.   *   Advance diet per VCE (video capsule endoscopy) protocol.       Azucena Freed  01/20/2019, 9:20 AM Phone [phone]   Attending physician's note   I have reviewed the chart.  I agree with the Advanced Practitioner's note, impression and recommendations.   VCE in progress.  Final recommendations thereafter.  Carmell Austria, MD

## 2019-01-20 NOTE — Progress Notes (Signed)
Patient is tolerating liquids and soft foods. Will continue to monitor and advance diet as appropriate.

## 2019-01-20 NOTE — Progress Notes (Addendum)
The patient has been seen in conjunction with Reino Bellis, NP. All aspects of care have been considered and discussed. The patient has been personally interviewed, examined, and all clinical data has been reviewed.   Hemoglobin slightly higher.  Continue Brilinta unless compelling reasons to discontinue or switch to Plavix.  Progress Note  Patient Name: Howard Thompson Date of Encounter: 01/20/2019  Primary Cardiologist: Shelva Majestic, MD   Subjective   No chest pain.   Inpatient Medications    Scheduled Meds: . atorvastatin  80 mg Oral QPM  . buPROPion  150 mg Oral BID  . carvedilol  3.125 mg Oral BID WC  . folic acid  1 mg Oral Daily  . furosemide  40 mg Intravenous Daily  . losartan  25 mg Oral Daily  . metoCLOPramide (REGLAN) injection  10 mg Intravenous Once  . pantoprazole  40 mg Oral BID  . potassium chloride  40 mEq Oral BID  . spironolactone  12.5 mg Oral Daily  . ticagrelor  90 mg Oral BID   Continuous Infusions: . sodium chloride     PRN Meds: acetaminophen **OR** acetaminophen, ondansetron **OR** ondansetron (ZOFRAN) IV   Vital Signs    Vitals:   01/20/19 0034 01/20/19 0426 01/20/19 0615 01/20/19 0652  BP: 100/62 108/69 97/61 125/76  Pulse: 97 87 97 100  Resp: 18 18 18 17   Temp: 97.8 F (36.6 C) 98.6 F (37 C) 97.7 F (36.5 C) 98.1 F (36.7 C)  TempSrc: Oral Oral Oral Oral  SpO2: 100% 93% 97% 97%  Weight:  71.6 kg    Height:        Intake/Output Summary (Last 24 hours) at 01/20/2019 0859 Last data filed at 01/20/2019 0831 Gross per 24 hour  Intake 1019 ml  Output 4450 ml  Net -3431 ml   Last 3 Weights 01/20/2019 01/19/2019 01/19/2019  Weight (lbs) 157 lb 12.8 oz 165 lb 2 oz 165 lb  Weight (kg) 71.578 kg 74.9 kg 74.844 kg  Some encounter information is confidential and restricted. Go to Review Flowsheets activity to see all data.      Telemetry    ST - Personally Reviewed  ECG    N/a - Personally Reviewed  Physical Exam   GEN:  No acute distress.   Neck: No JVD Cardiac: RRR, no murmurs, rubs, or gallops.  Respiratory: Clear to auscultation bilaterally. GI: Soft, nontender, non-distended  MS: No edema; No deformity. Neuro:  Nonfocal  Psych: Normal affect   Labs    Chemistry Recent Labs  Lab 01/19/19 0556 01/20/19 0321  NA 134* 137  K 4.3 3.2*  CL 104 104  CO2 21* 25  GLUCOSE 114* 92  BUN 18 12  CREATININE 1.07 1.05  CALCIUM 9.1 8.7*  GFRNONAA >60 >60  GFRAA >60 >60  ANIONGAP 9 8     Hematology Recent Labs  Lab 01/19/19 0556 01/19/19 1639 01/20/19 0321  WBC 9.1  --  4.8  RBC 2.90*  --  2.78*  HGB 6.1* 7.0* 6.5*  HCT 22.3* 22.3* 20.7*  MCV 76.9*  --  74.5*  MCH 21.0*  --  23.4*  MCHC 27.4*  --  31.4  RDW 20.9*  --  20.1*  PLT 464*  --  403*    Cardiac Enzymes Recent Labs  Lab 01/19/19 0556  TROPONINI 0.04*   No results for input(s): TROPIPOC in the last 168 hours.   BNP Recent Labs  Lab 01/19/19 0557  BNP 553.9*  DDimer No results for input(s): DDIMER in the last 168 hours.   Radiology    Dg Chest 2 View  Result Date: 01/19/2019 CLINICAL DATA:  Shortness of breath EXAM: CHEST - 2 VIEW COMPARISON:  12/18/2018 FINDINGS: Normal heart size. Prior median sternotomy with history of CABG. There is diffuse interstitial coarsening with Kerley lines and cephalized blood flow. No pleural effusion. Borderline hyperinflation. IMPRESSION: CHF pattern. Electronically Signed   By: Monte Fantasia M.D.   On: 01/19/2019 06:21    Cardiac Studies   N/a   Patient Profile     52 y.o. male with a history of CAD with recent anterior STEMI in 12/16/2018 s/p DES to LAD, cardiac arrest in 04/2018 (due to ventricular fibrillation/STEMI), ischemic cardiomyopathy, PAD s/p left femoral endarterectomy in 2016, GI bleeds requiring transfusion, polysubstance abuse, myasthenia gravis s/p thymectomy, suspected COPD, who was admitted for GI bleed with hemoglobin of 6.1 and positive Hemoccult. Cardiology  consulted for assistance with antiplatelet therapy given recent PCI.  Assessment & Plan    1. Acute GI Bleed - Patient presented with dyspnea, fatigue, and weakness and was found to have an acute GI bleed with hemoglobin of 6.1 and positive hemoccult. Received 1 unit PRBCs yesterday, additional unit to be given today. Hgb 6.5 this morning. - ASA stopped with continuation of Brilinta at this time.  - Seen by GI and just returning from capsule endoscopy this morning. Work up pending.  2. CAD with Recent STEMI s/p DES to LAD - PTA on dual antiplatelet therapy with Aspirin and Brilinta. Stopped ASA in the setting of acute GI bleed. Will discontinue Aspirin due to acute GI bleed but Brilinta still on board. Previously on plavix with stent thrombosis. Complicated situation. Will discuss with MD regard further antiplatelet therapy. - Continue aggressive secondary prevention with beta-blocker and high-intensity statin.  3. Acute Systolic CHF / Ischemic Cardiomyopathy - Echo from 12/16/2018 showed LVEF of 25-30% with severe hypokinesis of the left ventricular anteroseptal wall, inferoseptal wall, and apical segment. - BNP elevated at 553.9 on arrival, Chest x-ray consistent with CHF. - Net - 4.2L. Remains on IV lasix 40mg  daily.  - Continue Coreg 3.125 twice daily, Losartan 25mg  daily, and Spironolactone 25mg  daily.  4. Elevated Troponin - Initial troponin minimally elevated at 0.04. EKG shows sinus tachycardia with Sinus tachycardia, rate 120 bpm, with evidence of old infarct in anterior leads with elevated J point and resolving ST elevation in leads V2 through V4.  - Felt to be demand ischemia in the setting of acute GI bleed.  5. History of Polysubstance Abuse - UDS screen negative this admission. UDS has been positive for cocaine, benzodiazepines, amphetamines, and THC. Last used cocaine 4-6 days prior to admission. Also smoking again.   6. Hypokalemia: K+ 3.2 this morning. Supplement per  primary.   For questions or updates, please contact [place] Please consult [email or web address] for contact info under   Signed, Reino Bellis, NP  01/20/2019, 8:59 AM

## 2019-01-20 NOTE — Progress Notes (Signed)
Blood transfusion ended about 2hrs ago, patient breathing is a little labored. Placed pt on 2L O2 via nasal cannula for comfort. O2 100%, HR 93. Patient says he will let RN know if he needs a nebulizer or anything else for his breathing, but he's ok now.

## 2019-01-20 NOTE — Plan of Care (Signed)
  Problem: Health Behavior/Discharge Planning: Goal: Ability to manage health-related needs will improve Outcome: Progressing   Problem: Clinical Measurements: Goal: Diagnostic test results will improve Outcome: Progressing   Problem: Clinical Measurements: Goal: Cardiovascular complication will be avoided Outcome: Progressing   Problem: Activity: Goal: Risk for activity intolerance will decrease Outcome: Progressing   Problem: Pain Managment: Goal: General experience of comfort will improve Outcome: Progressing   Problem: Safety: Goal: Ability to remain free from injury will improve Outcome: Progressing

## 2019-01-20 NOTE — Plan of Care (Signed)
  Problem: Health Behavior/Discharge Planning: Goal: Ability to manage health-related needs will improve Outcome: Progressing   Problem: Clinical Measurements: Goal: Ability to maintain clinical measurements within normal limits will improve Outcome: Progressing   Problem: Clinical Measurements: Goal: Respiratory complications will improve Outcome: Progressing   

## 2019-01-21 ENCOUNTER — Encounter (HOSPITAL_COMMUNITY): Payer: Self-pay | Admitting: Internal Medicine

## 2019-01-21 DIAGNOSIS — I2510 Atherosclerotic heart disease of native coronary artery without angina pectoris: Secondary | ICD-10-CM

## 2019-01-21 DIAGNOSIS — I255 Ischemic cardiomyopathy: Secondary | ICD-10-CM

## 2019-01-21 DIAGNOSIS — D62 Acute posthemorrhagic anemia: Secondary | ICD-10-CM

## 2019-01-21 DIAGNOSIS — K5521 Angiodysplasia of colon with hemorrhage: Principal | ICD-10-CM

## 2019-01-21 LAB — BASIC METABOLIC PANEL
Anion gap: 9 (ref 5–15)
BUN: 13 mg/dL (ref 6–20)
CO2: 24 mmol/L (ref 22–32)
Calcium: 9.1 mg/dL (ref 8.9–10.3)
Chloride: 103 mmol/L (ref 98–111)
Creatinine, Ser: 1.22 mg/dL (ref 0.61–1.24)
GFR calc Af Amer: >60 mL/min (ref >60)
GFR calc non Af Amer: >60 mL/min (ref >60)
Glucose, Bld: 99 mg/dL (ref 70–99)
Potassium: 3.9 mmol/L (ref 3.5–5.1)
Sodium: 136 mmol/L (ref 135–145)

## 2019-01-21 LAB — CBC
HCT: 25.9 % — ABNORMAL LOW (ref 39.0–52.0)
Hemoglobin: 7.8 g/dL — ABNORMAL LOW (ref 13.0–17.0)
MCH: 23.2 pg — ABNORMAL LOW (ref 26.0–34.0)
MCHC: 30.1 g/dL (ref 30.0–36.0)
MCV: 77.1 fL — AB (ref 80.0–100.0)
Platelets: 423 10*3/uL — ABNORMAL HIGH (ref 150–400)
RBC: 3.36 MIL/uL — ABNORMAL LOW (ref 4.22–5.81)
RDW: 20.8 % — ABNORMAL HIGH (ref 11.5–15.5)
WBC: 6 10*3/uL (ref 4.0–10.5)
nRBC: 0.7 % — ABNORMAL HIGH (ref 0.0–0.2)

## 2019-01-21 LAB — GLUCOSE, CAPILLARY
Glucose-Capillary: 143 mg/dL — ABNORMAL HIGH (ref 70–99)
Glucose-Capillary: 99 mg/dL (ref 70–99)

## 2019-01-21 LAB — BPAM RBC
Blood Product Expiration Date: 202004012359
Blood Product Expiration Date: 202004172359
ISSUE DATE / TIME: 202003260626
ISSUE DATE / TIME: 202003250912
Unit Type and Rh: 5100
Unit Type and Rh: 5100

## 2019-01-21 LAB — TYPE AND SCREEN
ABO/RH(D): O POS
Antibody Screen: NEGATIVE
Unit division: 0
Unit division: 0

## 2019-01-21 MED ORDER — FERROUS SULFATE 325 (65 FE) MG PO TABS
325.0000 mg | ORAL_TABLET | Freq: Two times a day (BID) | ORAL | Status: DC
  Filled 2019-01-21: qty 1

## 2019-01-21 MED ORDER — TRAZODONE HCL 50 MG PO TABS
50.0000 mg | ORAL_TABLET | Freq: Once | ORAL | Status: AC
  Administered 2019-01-21: 50 mg via ORAL
  Filled 2019-01-21: qty 1

## 2019-01-21 MED ORDER — PANTOPRAZOLE SODIUM 40 MG PO TBEC
40.0000 mg | DELAYED_RELEASE_TABLET | Freq: Every day | ORAL | Status: DC
  Administered 2019-01-22: 40 mg via ORAL
  Filled 2019-01-21: qty 1

## 2019-01-21 MED ORDER — RAMELTEON 8 MG PO TABS
8.0000 mg | ORAL_TABLET | Freq: Once | ORAL | Status: AC
  Administered 2019-01-21: 8 mg via ORAL
  Filled 2019-01-21: qty 1

## 2019-01-21 NOTE — Progress Notes (Signed)
Spoke with patient and patient stated that he decided to stay. Paged  Internal medicine to make aware of patients decision.

## 2019-01-21 NOTE — Progress Notes (Signed)
Patient now has legal papers which he says have to be signed today so he wants to leave the hospital.  He was wondering if he could come back tomorrow for the enteroscopy but this is not going to be possible.  Patient will be contacted by Dr. Carlean Purl with info re:  outpt enteroscopy.    Azucena Freed PA-C.

## 2019-01-21 NOTE — Progress Notes (Signed)
Pt states he never wanted to leave AMA and that he wants to have this procedure done tomorrow or he will leave AMA. Rn called Gastroenterology on call. MD states procedure was cancelled but will try to add pt on in the morning. Pt states he will stay till the morning and "if I do leave AMA I want to know my hemoglobin levels so I don't have to come back in here if they're low and I have already left". Will continue to monitor pt.

## 2019-01-21 NOTE — Progress Notes (Signed)
Pt refusing bed alarm.

## 2019-01-21 NOTE — TOC Benefit Eligibility Note (Signed)
Transition of Care [facility]) Benefit Eligibility Note    Patient Details  Name: Howard Thompson MRN: [id number] Date of Birth: [birth year]   Medication/Dose: [name]  98/51 MG BID(SACUBITRIL-VALSARTAN - NOT COVER)     Tier: Other(4 DRUG)  Prescription Coverage Preferred Pharmacy: YES ([facility] AND  [facility])  Spoke with Person/Company/Phone Number:: [place] @ [facility] # [phone]  Co-Pay: $ 300.00  Prior Approval: Yes  Deductible: (NO DEDUCTIBLE  /  OUT-OF-POCKET- NOT MET)       Memory Argue Phone Number: 01/21/2019, 11:03 AM

## 2019-01-21 NOTE — H&P (View-Only) (Signed)
          Daily Rounding Note  01/21/2019, 12:34 PM  LOS: 2 days   SUBJECTIVE:   Chief complaint:  IDA, FOBT+    Feels well, no complaints.  Fatigue and SOB resolved  OBJECTIVE:         Vital signs in last 24 hours:    Temp:  [97.5 F (36.4 C)-97.9 F (36.6 C)] 97.5 F (36.4 C) (03/27 1146) Pulse Rate:  [94-113] 100 (03/27 1146) Resp:  [16-18] 18 (03/27 1146) BP: (94-130)/(64-80) 108/75 (03/27 1146) SpO2:  [98 %-100 %] 100 % (03/27 1146) Weight:  [71.9 kg] 71.9 kg (03/27 0503) Last BM Date: 01/20/19 Filed Weights   01/19/19 1041 01/20/19 0426 01/21/19 0503  Weight: 74.9 kg 71.6 kg 71.9 kg   General: looks well.  NAD   Did not reexamine pt.   No resp distress or coughing  Intake/Output from previous day: 03/26 0701 - 03/27 0700 In: 1292.5 [P.O.:960; Blood:332.5] Out: 300 [Urine:300]  Intake/Output this shift: Total I/O In: 240 [P.O.:240] Out: -   Lab Results: Recent Labs    01/19/19 0556  01/20/19 0321 01/20/19 1158 01/21/19 0336  WBC 9.1  --  4.8  --  6.0  HGB 6.1*   < > 6.5* 8.4* 7.8*  HCT 22.3*   < > 20.7* 27.5* 25.9*  PLT 464*  --  403*  --  423*   < > = values in this interval not displayed.   BMET Recent Labs    01/19/19 0556 01/20/19 0321 01/21/19 0336  NA 134* 137 136  K 4.3 3.2* 3.9  CL 104 104 103  CO2 21* 25 24  GLUCOSE 114* 92 99  BUN 18 12 13   CREATININE 1.07 1.05 1.22  CALCIUM 9.1 8.7* 9.1     ASSESMENT:   *Anemia, Iron deficiency (Ferritin 14, iron 8), FOBT+, on Brilinta/81 ASA. GI bleeding and ABL anemia August and September 2019. 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 Hyperplastic ascending colon polyp. Compliant with twice daily Protonix. 01/20/19 VCE (video capsule endoscopy):  6 duodenal AVMs noted, 1 of these moderate to large sized but none bleeding.  Appear to be reachable by Enteroscopy.   PRBC x 3, Hgb 6.1 >> 6.5 >> 8.4 >> 7.8 .    Feraheme on 3/25 and in 11/2018  * 11/2018 STEMI, instent restenosis/thrombosis s/p DES to [place] running out of Plavix. Brilinta, low-dose aspirin restarted after this. Brilinta not on hold.     PLAN   *   Rec: stay inpt overnight and undergo Enteroscopy tomorrow.  As he ate this solid meal this AM, will need to wait til tomorrow.  pt agreeable.    *   Drop dose of Protonix to 40 mg po 1x daily as no evidence of ulcers, gastritis, esophagitis etc.    *   Chronic oral iron.  Adding FE SO4 325 mg bid 3/26.  Got Feraheme on 3/25  Will hold iron for now, restart tomorrow after EGD as it can confuse visualization of blood at endoscopy.    Azucena Freed  01/21/2019, 12:34 PM Phone [phone]

## 2019-01-21 NOTE — Progress Notes (Addendum)
Patient stated that he needs to sign a closure form for a property and asked if he can leave the premises to meet his attorney and come back.  I asked him if it could be faxed to the unit so that he could sign it and he said it cannot.  Patient stated that if it is not possible for his attorney to come to the unit to sign it or him leave the hospital to sign it he was going to leave and sign an AMA.  [facility] Paged and spoke with internal medicine resident.

## 2019-01-21 NOTE — Telephone Encounter (Signed)
Called and spoke with pt in regards to CR, pt stated he is currently in the hospital and because of his schedule he would not be able to attend CR.  Closed referral.

## 2019-01-21 NOTE — Progress Notes (Addendum)
The patient has been seen in conjunction with Reino Bellis, NP. All aspects of care have been considered and discussed. The patient has been personally interviewed, examined, and all clinical data has been reviewed.   Hemoglobin now 7.8 after 3 units of blood.  Continue single antiplatelet agent, Brilinta.  Continue beta-blocker, renin-angiotensin-aldosterone system blockade, and mineralocorticoid antagonist therapy for systolic heart failure.  Optimally, Entresto rather than ARB therapy would be preferred, however compliance has been an issue part of which is finance related.  Progress Note  Patient Name: Howard Thompson Date of Encounter: 01/21/2019  Primary Cardiologist: Shelva Majestic, MD   Subjective   No concerns noted overnight.   Inpatient Medications    Scheduled Meds:  atorvastatin  80 mg Oral QPM   buPROPion  150 mg Oral BID   carvedilol  3.125 mg Oral BID WC   ferrous sulfate  325 mg Oral BID WC   folic acid  1 mg Oral Daily   furosemide  40 mg Intravenous Daily   losartan  25 mg Oral Daily   pantoprazole  40 mg Oral BID   potassium chloride  40 mEq Oral BID   spironolactone  12.5 mg Oral Daily   ticagrelor  90 mg Oral BID   Continuous Infusions:  sodium chloride     PRN Meds: acetaminophen **OR** acetaminophen, ondansetron **OR** ondansetron (ZOFRAN) IV   Vital Signs    Vitals:   01/20/19 1658 01/20/19 1953 01/21/19 0503 01/21/19 0837  BP: 130/80 101/65 94/64 111/66  Pulse: (!) 113 (!) 107 94 98  Resp:  18 18 16   Temp:  97.8 F (36.6 C) 97.7 F (36.5 C) 97.9 F (36.6 C)  TempSrc:  Oral Oral Oral  SpO2:  99% 98% 99%  Weight:   71.9 kg   Height:        Intake/Output Summary (Last 24 hours) at 01/21/2019 0918 Last data filed at 01/21/2019 0847 Gross per 24 hour  Intake 1200 ml  Output 300 ml  Net 900 ml   Last 3 Weights 01/21/2019 01/20/2019 01/19/2019  Weight (lbs) 158 lb 8 oz 157 lb 12.8 oz 165 lb 2 oz  Weight (kg) 71.895 kg  71.578 kg 74.9 kg  Some encounter information is confidential and restricted. Go to Review Flowsheets activity to see all data.      Telemetry    ST - Personally Reviewed  ECG    N/a  - Personally Reviewed  Physical Exam   Exam per MD.  GEN: No acute distress.   Neck: No JVD Cardiac: RRR, no murmurs, rubs, or gallops.  Respiratory: Clear to auscultation bilaterally. GI: Soft, nontender, non-distended  MS: No edema; No deformity. Neuro:  Nonfocal  Psych: Normal affect   Labs    Chemistry Recent Labs  Lab 01/19/19 0556 01/20/19 0321 01/21/19 0336  NA 134* 137 136  K 4.3 3.2* 3.9  CL 104 104 103  CO2 21* 25 24  GLUCOSE 114* 92 99  BUN 18 12 13   CREATININE 1.07 1.05 1.22  CALCIUM 9.1 8.7* 9.1  GFRNONAA >60 >60 >60  GFRAA >60 >60 >60  ANIONGAP 9 8 9      Hematology Recent Labs  Lab 01/19/19 0556  01/20/19 0321 01/20/19 1158 01/21/19 0336  WBC 9.1  --  4.8  --  6.0  RBC 2.90*  --  2.78*  --  3.36*  HGB 6.1*   < > 6.5* 8.4* 7.8*  HCT 22.3*   < > 20.7* 27.5* 25.9*  MCV 76.9*  --  74.5*  --  77.1*  MCH 21.0*  --  23.4*  --  23.2*  MCHC 27.4*  --  31.4  --  30.1  RDW 20.9*  --  20.1*  --  20.8*  PLT 464*  --  403*  --  423*   < > = values in this interval not displayed.    Cardiac Enzymes Recent Labs  Lab 01/19/19 0556  TROPONINI 0.04*   No results for input(s): TROPIPOC in the last 168 hours.   BNP Recent Labs  Lab 01/19/19 0557  BNP 553.9*     DDimer No results for input(s): DDIMER in the last 168 hours.   Radiology    No results found.  Cardiac Studies   N/a  Patient Profile     53 y.o. male with a history of CAD with recent anterior STEMI in 12/16/2018 s/p DES to LAD,cardiac arrest in 04/2018 (due to ventricular fibrillation/STEMI), ischemic cardiomyopathy, PAD s/p left femoral endarterectomy in 2016, GI bleeds requiring transfusion, polysubstance abuse, myasthenia gravis s/p thymectomy, suspected COPD,whowas admitted for GI bleed  with hemoglobin of 6.1 and positive Hemoccult. Cardiology consulted for assistance with antiplatelet therapy given recent PCI.   Assessment & Plan    1. Acute GI Bleed - Patient presented with dyspnea, fatigue, and weakness and was found to have an acute GI bleed with hemoglobin of 6.1 and positive hemoccult on admission. Capsule endoscopy started yesterday, GI following. - 7.0>>6.5>>8.4>>7.8 - 3 units of PRBCs thus far  2. CAD with Recent STEMI s/p DES to LAD -PTA on dual antiplatelet therapy with Aspirin and Brilinta.Stopped ASA in the setting of acute GI bleed.  - Stopped Aspirin due to acute GI bleed but Brilinta still on board. Previously on plavix with stent thrombosis. Complicated situation. Need to continue on Brilinta. - on beta-blocker and high-intensity statin.   3. Acute Systolic CHF /Ischemic Cardiomyopathy - Echo from 12/16/2018 showed LVEF of 25-30% with severe hypokinesis of the left ventricular anteroseptal wall, inferoseptal wall, and apical segment. - BNP elevated at 553.9 on arrival, Chest x-ray consistent with CHF. - Net - 3.2L. Remains on IV lasix 40mg  daily, suspect ok to transition to oral lasix tomorrow - Continue Coreg 3.125 twice daily, Losartan 25mg  daily, and Spironolactone 25mg  daily.  4. History of Polysubstance Abuse - UDS screen negative this admission. UDS has been positive for cocaine, benzodiazepines, amphetamines, and THC.Last used cocaine 4-6 days prior to admission. Also smoking again.   5. Hypokalemia: resolved.   For questions or updates, please contact [place] Please consult [email or web address] for contact info under   Signed, Reino Bellis, NP  01/21/2019, 9:18 AM

## 2019-01-21 NOTE — Progress Notes (Signed)
          Daily Rounding Note  01/21/2019, 12:34 PM  LOS: 2 days   SUBJECTIVE:   Chief complaint:  IDA, FOBT+    Feels well, no complaints.  Fatigue and SOB resolved  OBJECTIVE:         Vital signs in last 24 hours:    Temp:  [97.5 F (36.4 C)-97.9 F (36.6 C)] 97.5 F (36.4 C) (03/27 1146) Pulse Rate:  [94-113] 100 (03/27 1146) Resp:  [16-18] 18 (03/27 1146) BP: (94-130)/(64-80) 108/75 (03/27 1146) SpO2:  [98 %-100 %] 100 % (03/27 1146) Weight:  [71.9 kg] 71.9 kg (03/27 0503) Last BM Date: 01/20/19 Filed Weights   01/19/19 1041 01/20/19 0426 01/21/19 0503  Weight: 74.9 kg 71.6 kg 71.9 kg   General: looks well.  NAD   Did not reexamine pt.   No resp distress or coughing  Intake/Output from previous day: 03/26 0701 - 03/27 0700 In: 1292.5 [P.O.:960; Blood:332.5] Out: 300 [Urine:300]  Intake/Output this shift: Total I/O In: 240 [P.O.:240] Out: -   Lab Results: Recent Labs    01/19/19 0556  01/20/19 0321 01/20/19 1158 01/21/19 0336  WBC 9.1  --  4.8  --  6.0  HGB 6.1*   < > 6.5* 8.4* 7.8*  HCT 22.3*   < > 20.7* 27.5* 25.9*  PLT 464*  --  403*  --  423*   < > = values in this interval not displayed.   BMET Recent Labs    01/19/19 0556 01/20/19 0321 01/21/19 0336  NA 134* 137 136  K 4.3 3.2* 3.9  CL 104 104 103  CO2 21* 25 24  GLUCOSE 114* 92 99  BUN 18 12 13   CREATININE 1.07 1.05 1.22  CALCIUM 9.1 8.7* 9.1     ASSESMENT:   *Anemia, Iron deficiency (Ferritin 14, iron 8), FOBT+, on Brilinta/81 ASA. GI bleeding and ABL anemia August and September 2019. 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 Hyperplastic ascending colon polyp. Compliant with twice daily Protonix. 01/20/19 VCE (video capsule endoscopy):  6 duodenal AVMs noted, 1 of these moderate to large sized but none bleeding.  Appear to be reachable by Enteroscopy.   PRBC x 3, Hgb 6.1 >> 6.5 >> 8.4 >> 7.8 .    Feraheme on 3/25 and in 11/2018  * 11/2018 STEMI, instent restenosis/thrombosis s/p DES to [place] running out of Plavix. Brilinta, low-dose aspirin restarted after this. Brilinta not on hold.     PLAN   *   Rec: stay inpt overnight and undergo Enteroscopy tomorrow.  As he ate this solid meal this AM, will need to wait til tomorrow.  pt agreeable.    *   Drop dose of Protonix to 40 mg po 1x daily as no evidence of ulcers, gastritis, esophagitis etc.    *   Chronic oral iron.  Adding FE SO4 325 mg bid 3/26.  Got Feraheme on 3/25  Will hold iron for now, restart tomorrow after EGD as it can confuse visualization of blood at endoscopy.    Azucena Freed  01/21/2019, 12:34 PM Phone [phone]

## 2019-01-21 NOTE — Progress Notes (Addendum)
   Subjective: No overnight events. Patient reports feeling well this morning. He denies any more dark stools. States he has been having loose stools, two yesterday, that were light brown with no blood. He thinks the loose stools are due to miralax. He feels his breathing and weakness has improved since he came in. He plans on ambulating more today and will evaluate how his breathing and weakness are with activity. All questions and concerns addressed.   Objective:  Vital signs in last 24 hours: Vitals:   01/20/19 1658 01/20/19 1953 01/21/19 0503 01/21/19 0837  BP: 130/80 101/65 94/64 111/66  Pulse: (!) 113 (!) 107 94 98  Resp:  18 18 16   Temp:  97.8 F (36.6 C) 97.7 F (36.5 C) 97.9 F (36.6 C)  TempSrc:  Oral Oral Oral  SpO2:  99% 98% 99%  Weight:   71.9 kg   Height:       Physical Exam Gen: seen laying comfortably in bed, no distress CV: RRR, no murmurs  Ext: warm and well perfused, no edema   Assessment/Plan:  Active Problems:   Ischemic cardiomyopathy   Acute blood loss anemia   CAD in native artery   GI bleeding  Howard Thompson a 53 y.o.gentleman with PMHxsignificant forCAD s/p LAD PCItwice,ischemic cardiomyopathy (EF 25-30%),history of cardiac arrest,GI bleed,myasthenia gravis s/pthymectomy,and polysubstance abuse which include tobacco, alcohol and cocaine came to ED with complaint of worsening fatigue and shortness of breath over the past week,last night he was unable to sleep and experiencing a lot of orthopnea and PND.  GI bleeding: Hgb 7.8 this morning. He has been given a total of 2 units of PRBC's this admission. Suspect bleeding 2/2 recurrent AVM's. Currently on brilinta. Results from capsule endoscopy pending.  - GI on board, appreciate recommendations - Continue Protonix 40 mg twice daily - Monitor CBC - Transfuse if Hgb < 7.0 or <8 and symptomatic   HFrEF: Due to ischemic cardiomyopathy. Euvolemic on exam - Cardiology on board, appreciate  recommendations - Per cardiology, discontinue aspirin and continue Brilinta - Diurese with Lasix 40 mg daily - Continue home dose of carvedilol, losartanand Spironolactone - Monitor daily weight - Strict ins and outs - Daily BMP  Dispo: Anticipated discharge pending capsule endoscopy results.   [name], DO 01/21/2019, 10:21 AM Pager: [phone]

## 2019-01-21 NOTE — TOC Initial Note (Signed)
Transition of Care [facility]) - Initial/Assessment Note    Patient Details  Name: Howard Thompson MRN: [id number] Date of Birth: 1965/12/09  Transition of Care [facility]) CM/SW Contact:    Sherrilyn Rist Phone Number: [phone] 01/21/2019, 10:14 AM  Clinical Narrative:                 Patient lives at home alone; Radiontchenko, [place] Sink, MD; has private insurance with [facility] with prescription drug coverage; [facility] [address]; he is independent, continues to smoke but states that he plans to stop; no DME; CM will continue to follow for progression of care.  Expected Discharge Plan: Home/Self Care Barriers to Discharge: No Barriers Identified   Patient Goals and CMS Choice Patient states their goals for this hospitalization and ongoing recovery are:: to get out of here CMS Medicare.gov Compare Post Acute Care list provided to:: Patient Choice offered to / list presented to : NA  Expected Discharge Plan and Services Expected Discharge Plan: Home/Self Care In-house Referral: NA Discharge Planning Services: CM Consult Post Acute Care Choice: NA Living arrangements for the past 2 months: Single Family Home                 DME Arranged: N/A DME Agency: NA HH Arranged: NA HH Agency: NA  Prior Living Arrangements/Services Living arrangements for the past 2 months: Single Family Home Lives with:: Self Patient language and need for interpreter reviewed:: No Do you feel safe going back to the place where you live?: Yes      Need for Family Participation in Patient Care: No (Comment) Care giver support system in place?: Yes (comment)   Criminal Activity/Legal Involvement Pertinent to Current Situation/Hospitalization: No - Comment as needed  Activities of Daily Living Home Assistive Devices/Equipment: None ADL Screening (condition at time of admission) Patient's cognitive ability adequate to safely complete daily activities?: Yes Is the patient  deaf or have difficulty hearing?: No Does the patient have difficulty seeing, even when wearing glasses/contacts?: No Does the patient have difficulty concentrating, remembering, or making decisions?: No Patient able to express need for assistance with ADLs?: Yes Does the patient have difficulty dressing or bathing?: No Independently performs ADLs?: Yes (appropriate for developmental age) Does the patient have difficulty walking or climbing stairs?: No Weakness of Legs: None Weakness of Arms/Hands: None  Permission Sought/Granted Permission sought to share information with : Case Manager Permission granted to share information with : Yes, Verbal Permission Granted  Share Information with NAME: Lennette Bihari - friend           Emotional Assessment Appearance:: Developmentally appropriate Attitude/Demeanor/Rapport: Gracious Affect (typically observed): Accepting Orientation: : Oriented to Self, Oriented to  Time, Oriented to Place, Oriented to Situation Alcohol / Substance Use: Not Applicable Psych Involvement: No (comment)  Admission diagnosis:  Acute combined systolic and diastolic heart failure (HCC) [I50.41] Anemia due to blood loss [D50.0] Gastrointestinal hemorrhage, unspecified gastrointestinal hemorrhage type [K92.2] Patient Active Problem List   Diagnosis Date Noted  . GI bleeding 01/19/2019  . Iron deficiency anemia 12/20/2018  . CAD in native artery 12/20/2018  . Acute blood loss anemia   . Long term current use of antithrombotics/antiplatelets   . Symptomatic anemia 07/14/2018  . Protein-calorie malnutrition, severe 06/11/2018  . Anemia due to chronic blood loss   . Hyperlipidemia LDL goal <70   . Ischemic cardiomyopathy    PCP:  Jamesetta Geralds, MD Pharmacy:   [id number] [place], [address] [place] [address] Phone: [phone] Fax: [phone]  [facility] [place],  [place] - [address] [place] [address] Phone: [phone] Fax: [phone]     Social Determinants of Health (SDOH) Interventions    Readmission Risk Interventions No flowsheet data found.

## 2019-01-22 ENCOUNTER — Inpatient Hospital Stay (HOSPITAL_COMMUNITY): Payer: 59 | Admitting: Certified Registered Nurse Anesthetist

## 2019-01-22 ENCOUNTER — Encounter (HOSPITAL_COMMUNITY)
Admission: EM | Disposition: A | Discharge status: Home / Self Care | Payer: Self-pay | Source: Home / Self Care | Attending: Student in an Organized Health Care Education/Training Program

## 2019-01-22 ENCOUNTER — Encounter (HOSPITAL_COMMUNITY): Payer: Self-pay | Admitting: *Deleted

## 2019-01-22 DIAGNOSIS — K922 Gastrointestinal hemorrhage, unspecified: Secondary | ICD-10-CM

## 2019-01-22 DIAGNOSIS — K210 Gastro-esophageal reflux disease with esophagitis: Secondary | ICD-10-CM

## 2019-01-22 DIAGNOSIS — I5041 Acute combined systolic (congestive) and diastolic (congestive) heart failure: Secondary | ICD-10-CM

## 2019-01-22 DIAGNOSIS — Z7902 Long term (current) use of antithrombotics/antiplatelets: Secondary | ICD-10-CM

## 2019-01-22 HISTORY — PX: HOT HEMOSTASIS: SHX5433

## 2019-01-22 HISTORY — PX: ENTEROSCOPY: SHX5533

## 2019-01-22 LAB — CBC
HCT: 25.5 % — ABNORMAL LOW (ref 39.0–52.0)
Hemoglobin: 7.7 g/dL — ABNORMAL LOW (ref 13.0–17.0)
MCH: 24.1 pg — ABNORMAL LOW (ref 26.0–34.0)
MCHC: 30.2 g/dL (ref 30.0–36.0)
MCV: 79.9 fL — AB (ref 80.0–100.0)
NRBC: 2.1 % — AB (ref 0.0–0.2)
Platelets: 414 10*3/uL — ABNORMAL HIGH (ref 150–400)
RBC: 3.19 MIL/uL — ABNORMAL LOW (ref 4.22–5.81)
RDW: 21.9 % — ABNORMAL HIGH (ref 11.5–15.5)
WBC: 6.6 10*3/uL (ref 4.0–10.5)

## 2019-01-22 LAB — BASIC METABOLIC PANEL
Anion gap: 5 (ref 5–15)
BUN: 16 mg/dL (ref 6–20)
CO2: 23 mmol/L (ref 22–32)
Calcium: 9.1 mg/dL (ref 8.9–10.3)
Chloride: 108 mmol/L (ref 98–111)
Creatinine, Ser: 1.15 mg/dL (ref 0.61–1.24)
GFR calc Af Amer: >60 mL/min (ref >60)
GFR calc non Af Amer: >60 mL/min (ref >60)
Glucose, Bld: 109 mg/dL — ABNORMAL HIGH (ref 70–99)
POTASSIUM: 3.9 mmol/L (ref 3.5–5.1)
Sodium: 136 mmol/L (ref 135–145)

## 2019-01-22 LAB — GLUCOSE, CAPILLARY: Glucose-Capillary: 92 mg/dL (ref 70–99)

## 2019-01-22 SURGERY — ENTEROSCOPY
Anesthesia: Monitor Anesthesia Care

## 2019-01-22 MED ORDER — GLUCAGON HCL RDNA (DIAGNOSTIC) 1 MG IJ SOLR
INTRAMUSCULAR | Status: AC
  Filled 2019-01-22: qty 1

## 2019-01-22 MED ORDER — FUROSEMIDE 40 MG PO TABS
60.0000 mg | ORAL_TABLET | Freq: Every day | ORAL | Status: DC
  Administered 2019-01-22: 60 mg via ORAL
  Filled 2019-01-22: qty 1

## 2019-01-22 MED ORDER — FUROSEMIDE 20 MG PO TABS: 60.0000 mg | ORAL_TABLET | Freq: Every day | ORAL | 0 refills | Status: DC

## 2019-01-22 MED ORDER — LOSARTAN POTASSIUM 25 MG PO TABS: 25.0000 mg | ORAL_TABLET | Freq: Every day | ORAL | Status: DC

## 2019-01-22 MED ORDER — PROPOFOL 10 MG/ML IV BOLUS
INTRAVENOUS | Status: DC | PRN
  Administered 2019-01-22: 20 mg via INTRAVENOUS

## 2019-01-22 MED ORDER — ADULT MULTIVITAMIN W/MINERALS CH
1.0000 | ORAL_TABLET | Freq: Every day | ORAL | Status: DC
  Administered 2019-01-22: 1 via ORAL
  Filled 2019-01-22: qty 1

## 2019-01-22 MED ORDER — PROPOFOL 500 MG/50ML IV EMUL
INTRAVENOUS | Status: DC | PRN
  Administered 2019-01-22: 100 ug/kg/min via INTRAVENOUS

## 2019-01-22 MED ORDER — PANTOPRAZOLE SODIUM 40 MG PO TBEC: 40.0000 mg | DELAYED_RELEASE_TABLET | Freq: Two times a day (BID) | ORAL | 0 refills | Status: DC

## 2019-01-22 MED ORDER — ASPIRIN EC 81 MG PO TBEC: 81.0000 mg | DELAYED_RELEASE_TABLET | Freq: Every day | ORAL | Status: DC

## 2019-01-22 MED ORDER — PANTOPRAZOLE SODIUM 40 MG PO TBEC: 40.0000 mg | DELAYED_RELEASE_TABLET | Freq: Two times a day (BID) | ORAL | Status: DC

## 2019-01-22 MED ORDER — FOLIC ACID 1 MG PO TABS: 1.0000 mg | ORAL_TABLET | Freq: Every day | ORAL | Status: DC

## 2019-01-22 MED ORDER — LACTATED RINGERS IV SOLN
INTRAVENOUS | Status: DC
  Administered 2019-01-22: 09:00:00 via INTRAVENOUS

## 2019-01-22 MED ORDER — BUPROPION HCL ER (SR) 150 MG PO TB12: 150.0000 mg | ORAL_TABLET | Freq: Two times a day (BID) | ORAL | Status: DC

## 2019-01-22 MED ORDER — POTASSIUM CHLORIDE CRYS ER 20 MEQ PO TBCR: 20.0000 meq | EXTENDED_RELEASE_TABLET | Freq: Every day | ORAL | Status: DC

## 2019-01-22 MED ORDER — TICAGRELOR 90 MG PO TABS: 90.0000 mg | ORAL_TABLET | Freq: Two times a day (BID) | ORAL | Status: DC

## 2019-01-22 MED ORDER — CARVEDILOL 3.125 MG PO TABS: 3.1250 mg | ORAL_TABLET | Freq: Two times a day (BID) | ORAL | Status: DC

## 2019-01-22 MED ORDER — NITROGLYCERIN 0.4 MG SL SUBL: 0.4000 mg | SUBLINGUAL_TABLET | SUBLINGUAL | Status: DC | PRN

## 2019-01-22 MED ORDER — BUTAMBEN-TETRACAINE-BENZOCAINE 2-2-14 % EX AERO
INHALATION_SPRAY | CUTANEOUS | Status: DC | PRN
  Administered 2019-01-22: 2 via TOPICAL

## 2019-01-22 MED ORDER — PHENYLEPHRINE 40 MCG/ML (10ML) SYRINGE FOR IV PUSH (FOR BLOOD PRESSURE SUPPORT)
PREFILLED_SYRINGE | INTRAVENOUS | Status: DC | PRN
  Administered 2019-01-22: 40 ug via INTRAVENOUS
  Administered 2019-01-22: 80 ug via INTRAVENOUS
  Administered 2019-01-22: 40 ug via INTRAVENOUS
  Administered 2019-01-22 (×2): 80 ug via INTRAVENOUS

## 2019-01-22 NOTE — Anesthesia Preprocedure Evaluation (Addendum)
Anesthesia Evaluation  Patient identified by MRN, date of birth, ID band Patient awake    Reviewed: Allergy & Precautions, NPO status , Patient's Chart, lab work & pertinent test results  Airway Mallampati: III  TM Distance: >3 FB Neck ROM: Full    Dental no notable dental hx.    Pulmonary COPD, Current Smoker,    Pulmonary exam normal breath sounds clear to auscultation       Cardiovascular + CAD, + Past MI, + Cardiac Stents and + Peripheral Vascular Disease  Normal cardiovascular exam Rhythm:Regular Rate:Normal     Neuro/Psych PSYCHIATRIC DISORDERS Myasthenia gravis     GI/Hepatic negative GI ROS, (+)     substance abuse  cocaine use,   Endo/Other  negative endocrine ROS  Renal/GU negative Renal ROS     Musculoskeletal negative musculoskeletal ROS (+)   Abdominal   Peds  Hematology  (+) anemia , HLD   Anesthesia Other Findings Heme + stool  Reproductive/Obstetrics                            Anesthesia Physical Anesthesia Plan  ASA: III  Anesthesia Plan: MAC   Post-op Pain Management:    Induction: Intravenous  PONV Risk Score and Plan: 0 and Propofol infusion and Treatment may vary due to age or medical condition  Airway Management Planned: Nasal Cannula  Additional Equipment:   Intra-op Plan:   Post-operative Plan:   Informed Consent: I have reviewed the patients History and Physical, chart, labs and discussed the procedure including the risks, benefits and alternatives for the proposed anesthesia with the patient or authorized representative who has indicated his/her understanding and acceptance.     Dental advisory given  Plan Discussed with: CRNA  Anesthesia Plan Comments:         Anesthesia Quick Evaluation

## 2019-01-22 NOTE — Progress Notes (Addendum)
   Subjective: Slept fine, was able to complete his paperwork while in the hospital. Asking about enteroscopy. States he ambulated with no difficulty yesterday. Denies feeling weakness, fatigued or short of breath. Has not had a BM in the past day, denies any more loose stools or blood in his stool/dark stools.    Objective:  Vital signs in last 24 hours: Vitals:   01/21/19 1751 01/21/19 2033 01/22/19 0421 01/22/19 0825  BP: 97/75 103/67 94/65 100/67  Pulse: (!) 102 92 87 89  Resp:  18 20   Temp:  97.9 F (36.6 C) (!) 97.3 F (36.3 C)   TempSrc:  Oral Oral   SpO2: 100% 99% 96% 100%  Weight:   73.1 kg   Height:       Physical Exam Gen: seen comfortably laying in bed, no distress Abdomen: bowel sounds present, soft, non distended, non tender CV: RRR, no murmurs Ext: no edema, warm and well perfused   Assessment/Plan:  Active Problems:   Ischemic cardiomyopathy   Anemia due to blood loss   Acute blood loss anemia   Antiplatelet or antithrombotic long-term use   CAD in native artery   GI bleeding   AVM (arteriovenous malformation) of small bowel, acquired with hemorrhage  Howard Thompson a 53 y.o.gentleman with PMHxsignificant forCAD s/p LAD PCItwice,ischemic cardiomyopathy (EF 25-30%),history of cardiac arrest,GI bleed,myasthenia gravis s/pthymectomy,and polysubstance abuse which include tobacco, alcohol and cocaine came to ED with complaint of worsening fatigue and shortness of breath over the past week,last night he was unable to sleep and experiencing a lot of orthopnea and PND.  GI bleeding:Hgb 7.7, stable.He has been given a total of 2units of PRBC's this admission. Suspect bleeding 2/2 recurrent AVM's. Currently on brilinta. Capsule endoscopy showed multiple small AVMs. GI planning to do a push enteroscopy today. If AVM not within reach of scope, considering trial of ocreotide and balloon enteroscopy outpatient.  Howard Thompson board, appreciate recommendations -  Continue Protonix 40 mg twice daily - Monitor CBC - Transfuse if Hgb < 7.0or <8 and symptomatic  HFrEF:Due to ischemic cardiomyopathy.Euvolemic on exam -[place] board, appreciate recommendations - Per cardiology,discontinue aspirin and continue Brilinta - Diurese with Lasix 40 mg daily, switch to oral 60 mg today - Continue home dose of carvedilol, losartanand Spironolactone - Monitor daily weight - Strict ins and outs - Daily BMP  Dispo: Anticipated discharge pending push enteroscopy.   Howard Craze, DO 01/22/2019, 8:39 AM Pager: [phone]

## 2019-01-22 NOTE — Transfer of Care (Signed)
Immediate Anesthesia Transfer of Care Note  Patient: Howard Thompson  Procedure(s) Performed: ENTEROSCOPY (N/A ) HOT HEMOSTASIS (ARGON PLASMA COAGULATION/BICAP) (N/A )  Patient Location: Endoscopy Unit  Anesthesia Type:MAC  Level of Consciousness: awake, alert  and oriented  Airway & Oxygen Therapy: Patient Spontanous Breathing and Patient connected to nasal cannula oxygen  Post-op Assessment: Report given to RN and Post -op Vital signs reviewed and stable  Post vital signs: Reviewed and stable  Last Vitals:  Vitals Value Taken Time  BP    Temp    Pulse 91 01/22/2019 10:19 AM  Resp 24 01/22/2019 10:19 AM  SpO2 96 % 01/22/2019 10:19 AM  Vitals shown include unvalidated device data.  Last Pain:  Vitals:   01/22/19 0915  TempSrc: Oral  PainSc: 0-No pain         Complications: No apparent anesthesia complications

## 2019-01-22 NOTE — Anesthesia Postprocedure Evaluation (Signed)
Anesthesia Post Note  Patient: Anvay Tennis  Procedure(s) Performed: ENTEROSCOPY (N/A ) HOT HEMOSTASIS (ARGON PLASMA COAGULATION/BICAP) (N/A )     Patient location during evaluation: PACU Anesthesia Type: MAC Level of consciousness: awake and alert Pain management: pain level controlled Vital Signs Assessment: post-procedure vital signs reviewed and stable Respiratory status: spontaneous breathing, nonlabored ventilation, respiratory function stable and patient connected to nasal cannula oxygen Cardiovascular status: stable and blood pressure returned to baseline Postop Assessment: no apparent nausea or vomiting Anesthetic complications: no    Last Vitals:  Vitals:   01/22/19 1035 01/22/19 1051  BP: (!) 97/56 92/66  Pulse: 93 95  Resp: 12   Temp:  (!) 36.3 C  SpO2: 96% 100%    Last Pain:  Vitals:   01/22/19 1051  TempSrc: Oral  PainSc:                  Ryan P Ellender

## 2019-01-22 NOTE — Progress Notes (Signed)
Progress Note  Patient Name: Howard Thompson Date of Encounter: 01/22/2019  Primary Cardiologist: Shelva Majestic, MD   Subjective   Denies chest pain, SOB, walked yesterday without difficulties. No abdominal pain.  Inpatient Medications    Scheduled Meds: . atorvastatin  80 mg Oral QPM  . buPROPion  150 mg Oral BID  . carvedilol  3.125 mg Oral BID WC  . ferrous sulfate  325 mg Oral BID WC  . folic acid  1 mg Oral Daily  . furosemide  60 mg Oral Daily  . losartan  25 mg Oral Daily  . pantoprazole  40 mg Oral Daily  . potassium chloride  40 mEq Oral BID  . spironolactone  12.5 mg Oral Daily  . ticagrelor  90 mg Oral BID   Continuous Infusions: . sodium chloride     PRN Meds: acetaminophen **OR** acetaminophen, ondansetron **OR** ondansetron (ZOFRAN) IV   Vital Signs    Vitals:   01/21/19 1751 01/21/19 2033 01/22/19 0421 01/22/19 0825  BP: 97/75 103/67 94/65 100/67  Pulse: (!) 102 92 87 89  Resp:  18 20   Temp:  97.9 F (36.6 C) (!) 97.3 F (36.3 C)   TempSrc:  Oral Oral   SpO2: 100% 99% 96% 100%  Weight:   73.1 kg   Height:        Intake/Output Summary (Last 24 hours) at 01/22/2019 0848 Last data filed at 01/22/2019 0427 Gross per 24 hour  Intake 960 ml  Output 500 ml  Net 460 ml   Last 3 Weights 01/22/2019 01/21/2019 01/20/2019  Weight (lbs) 161 lb 1.6 oz 158 lb 8 oz 157 lb 12.8 oz  Weight (kg) 73.074 kg 71.895 kg 71.578 kg  Some encounter information is confidential and restricted. Go to Review Flowsheets activity to see all data.      Telemetry    ST - Personally Reviewed  ECG    N/a  - Personally Reviewed  Physical Exam   Exam per MD.  GEN: No acute distress.   Neck: No JVD Cardiac: RRR, no murmurs, rubs, or gallops.  Respiratory: Clear to auscultation bilaterally. GI: Soft, nontender, non-distended  MS: No edema; No deformity. Neuro:  Nonfocal  Psych: Normal affect   Labs    Chemistry Recent Labs  Lab 01/20/19 0321 01/21/19 0336  01/22/19 0413  NA 137 136 136  K 3.2* 3.9 3.9  CL 104 103 108  CO2 25 24 23   GLUCOSE 92 99 109*  BUN 12 13 16   CREATININE 1.05 1.22 1.15  CALCIUM 8.7* 9.1 9.1  GFRNONAA >60 >60 >60  GFRAA >60 >60 >60  ANIONGAP 8 9 5      Hematology Recent Labs  Lab 01/20/19 0321 01/20/19 1158 01/21/19 0336 01/22/19 0413  WBC 4.8  --  6.0 6.6  RBC 2.78*  --  3.36* 3.19*  HGB 6.5* 8.4* 7.8* 7.7*  HCT 20.7* 27.5* 25.9* 25.5*  MCV 74.5*  --  77.1* 79.9*  MCH 23.4*  --  23.2* 24.1*  MCHC 31.4  --  30.1 30.2  RDW 20.1*  --  20.8* 21.9*  PLT 403*  --  423* 414*    Cardiac Enzymes Recent Labs  Lab 01/19/19 0556  TROPONINI 0.04*   No results for input(s): TROPIPOC in the last 168 hours.   BNP Recent Labs  Lab 01/19/19 0557  BNP 553.9*     DDimer No results for input(s): DDIMER in the last 168 hours.   Radiology  No results found.  Cardiac Studies   N/a  Patient Profile     53 y.o. male with a history of CAD with recent anterior STEMI in 12/16/2018 s/p DES to LAD,cardiac arrest in 04/2018 (due to ventricular fibrillation/STEMI), ischemic cardiomyopathy, PAD s/p left femoral endarterectomy in 2016, GI bleeds requiring transfusion, polysubstance abuse, myasthenia gravis s/p thymectomy, suspected COPD,whowas admitted for GI bleed with hemoglobin of 6.1 and positive Hemoccult. Cardiology consulted for assistance with antiplatelet therapy given recent PCI.   Assessment & Plan    1. Acute GI Bleed - Patient presented with dyspnea, fatigue, and weakness and was found to have an acute GI bleed with hemoglobin of 6.1 and positive hemoccult on admission. Capsule endoscopy showed multiple small AVMs. GI planning to do a push enteroscopy today. If AVM not within reach of scope, considering trial of ocreotide and balloon enteroscopy outpatient.  7.0>>6.5>>8.4>>7.8 - 3 units of PRBCs thus far  2. CAD with Recent STEMI s/p DES to LAD -PTA on dual antiplatelet therapy with Aspirin and  Brilinta.Stopped ASA in the setting of acute GI bleed.  - Stopped Aspirin due to acute GI bleed but Brilinta still on board. Previously on plavix with stent thrombosis. Complicated situation. Need to continue on Brilinta. - on beta-blocker and high-intensity statin.   3. Acute Systolic CHF /Ischemic Cardiomyopathy - Echo from 12/16/2018 showed LVEF of 25-30% with severe hypokinesis of the left ventricular anteroseptal wall, inferoseptal wall, and apical segment. - BNP elevated at 553.9 on arrival, Chest x-ray consistent with CHF. - Net - 3.2L.  - appears euvolemic, now on po lasix 60 mg daily and spironolactone 12.5 mg PO daily, I would continue - Continue Coreg 3.125 twice daily, Losartan 25mg  daily, and Spironolactone 25mg  daily. - Continue beta-blocker, renin-angiotensin-aldosterone system blockade, and mineralocorticoid antagonist therapy for systolic heart failure.  Optimally, Entresto rather than ARB therapy would be preferred, however compliance has been an issue part of which is finance related.  4. History of Polysubstance Abuse - UDS screen negative this admission. UDS has been positive for cocaine, benzodiazepines, amphetamines, and THC.Last used cocaine 4-6 days prior to admission. Also smoking again.   5. Hypokalemia: resolved.   For questions or updates, please contact [place] Please consult [email or web address] for contact info under   Signed, Ena Dawley, MD  01/22/2019, 8:48 AM

## 2019-01-22 NOTE — Discharge Instructions (Signed)
I also want you to start taking Lasix 60 mg daily. Make sure to follow up with your cardiologist in 1-2 weeks.

## 2019-01-22 NOTE — Interval H&P Note (Signed)
History and Physical Interval Note:  01/22/2019 9:39 AM  Howard Thompson  has presented today for surgery, with the diagnosis of Heme + stool.  The various methods of treatment have been discussed with the patient and family. After consideration of risks, benefits and other options for treatment, the patient has consented to  Procedure(s): ENTEROSCOPY (N/A) as a surgical intervention.  The patient's history has been reviewed, patient examined, no change in status, stable for surgery.  I have reviewed the patient's chart and labs.  Questions were answered to the patient's satisfaction.     [place]

## 2019-01-22 NOTE — Discharge Summary (Signed)
Name: Howard Thompson MRN: [id number] DOB: 06-28-1966 53 y.o. PCP: Jamesetta Geralds, MD  Date of Admission: 01/19/2019  5:45 AM Date of Discharge: 01/22/2019 Attending Physician: Joni Reining DO  Discharge Diagnosis: 1. GI Bleed 2. AVM of small bowel 3. Anemia due to blood loss  4. Ischemic cardiomyopathy    Discharge Medications: Allergies as of 01/22/2019   No Known Allergies     Medication List    STOP taking these medications   aspirin EC 81 MG tablet     TAKE these medications   atorvastatin 80 MG tablet Commonly known as:  LIPITOR Take 1 tablet (80 mg total) by mouth every evening.   buPROPion 150 MG 12 hr tablet Commonly known as:  WELLBUTRIN SR Take 1 tablet (150 mg total) by mouth 2 (two) times daily.   carvedilol 3.125 MG tablet Commonly known as:  COREG Take 1 tablet (3.125 mg total) by mouth 2 (two) times daily with a meal.   folic acid 1 MG tablet Commonly known as:  FOLVITE Take 1 tablet (1 mg total) by mouth daily.   furosemide 20 MG tablet Commonly known as:  LASIX Take 3 tablets (60 mg total) by mouth daily. Start taking on:  January 23, 2019   losartan 25 MG tablet Commonly known as:  COZAAR Take 1 tablet (25 mg total) by mouth daily.   multivitamin with minerals Tabs tablet Take 1 tablet by mouth daily.   nitroGLYCERIN 0.4 MG SL tablet Commonly known as:  NITROSTAT Place 0.4 mg under the tongue every 5 (five) minutes as needed for chest pain.   pantoprazole 40 MG tablet Commonly known as:  PROTONIX Take 1 tablet (40 mg total) by mouth 2 (two) times daily.   potassium chloride SA 20 MEQ tablet Commonly known as:  K-DUR,KLOR-CON Take 1 tablet (20 mEq total) by mouth daily.   spironolactone 25 MG tablet Commonly known as:  ALDACTONE Take 0.5 tablets (12.5 mg total) by mouth daily.   ticagrelor 90 MG Tabs tablet Commonly known as:  BRILINTA Take 1 tablet (90 mg total) by mouth 2 (two) times daily.       Disposition and  follow-up:   Howard Thompson was discharged from [facility] in Stable condition.  At the hospital follow up visit please address:  1.  GI bleed- please check CBC and make sure patient is scheduled to follow up with GI   HFrEF/Ischemic cardiomyopathy- aspirin stopped, continued brilinta and started furosemide 60 mg daily. Please make sure he follows up with cardiology closely   2.  Labs / imaging needed at time of follow-up: cbc  3.  Pending labs/ test needing follow-up: none  Follow-up Appointments: Follow-up Information    Gatha Mayer, MD Follow up.   Specialty:  Gastroenterology Contact information: [address] [address] [phone]        [facility]. Schedule an appointment as soon as possible for a visit.   Specialty:  Cardiology Contact information: [address] [place], [place], MD. Schedule an appointment as soon as possible for a visit.   Specialty:  Family Medicine Contact information: [address] [place] [address] [phone]        Troy Sine, MD .   Specialty:  Cardiology Contact information: [address] [place] [address] [place]  by problem list: 1. GI Bleed- Patient has a history of duodenitis and duodenal AVM's eradicated with APC and endo clip. GI bleeds have been complicated due to need for anticoagulation for extensive CAD s/p LAD PCI twice. He presented with worsening fatigue, shortness of breath for a week and dark stools. Found to have acute blood loss anemia likely 2/2 AVM's. He was on both aspirin and brilinta due to LAD DES stents. He was transfused with 2 units PRBC. GI performed a capsule endoscopy which showed multiple small AVM's. 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 GI recommended he continue pantoprazole 40 mg bid for 8 weeks and continue serial hemoglobin/hematocrit with transfusion as indicated. They will also consider repeat push enteroscopy APC therapy with additional bleeding in the future. Follow-up with Dr. Carlean Purl as an outpatient. 2. AVM of small bowel- see above 3. Anemia due to blood loss - patient was transfused with 2 units PRBC. Hgb was 7.7 day of discharge. 4. Ischemic cardiomyopathy- Per cardiology, aspirin was discontinued and continued on brilinta. Diuresed with lasix 40 mg and continued on home carvedilol, losartan and spironolactone.  Discharged to continue taking furosemide 60 mg daily. Recommended close follow up with cardiology.  Discharge Vitals:   BP 92/66 (BP Location: Right Arm)   Pulse 95   Temp (!) 97.4 F (36.3 C) (Oral)   Resp 12   Ht 5\' 9"  (1.753 m)   Wt 73.1 kg   SpO2 100%   BMI 23.79 kg/m   Pertinent Labs, Studies, and Procedures:   CBC Latest Ref Rng & Units 01/22/2019 01/21/2019 01/20/2019  WBC 4.0 - 10.5 K/uL 6.6 6.0 -  Hemoglobin 13.0 - 17.0 g/dL 7.7(L) 7.8(L) 8.4(L)  Hematocrit 39.0 - 52.0 % 25.5(L) 25.9(L) 27.5(L)  Platelets 150 - 400 K/uL 414(H) 423(H) -   CMP Latest Ref Rng & Units 01/22/2019 01/21/2019 01/20/2019  Glucose 70 - 99 mg/dL 109(H) 99 92  BUN 6 - 20 mg/dL 16 13 12   Creatinine 0.61 - 1.24 mg/dL 1.15 1.22 1.05  Sodium 135 - 145 mmol/L 136 136 137  Potassium 3.5 - 5.1 mmol/L 3.9 3.9 3.2(L)  Chloride 98 - 111 mmol/L 108 103 104  CO2 22 - 32 mmol/L 23 24 25   Calcium 8.9 - 10.3 mg/dL 9.1 9.1 8.7(L)  Total Protein 6.5 - 8.1 g/dL - - -  Total Bilirubin 0.3 - 1.2 mg/dL - - -  Alkaline Phos 38 - 126 U/L - - -  AST 15 - 41 U/L - - -  ALT 0 - 44 U/L - - -   Dg Chest 2 View  Result Date: 01/19/2019 CLINICAL DATA:  Shortness of breath EXAM: CHEST - 2 VIEW COMPARISON:  12/18/2018 FINDINGS: Normal heart size. Prior median sternotomy with history of CABG.  There is diffuse interstitial coarsening with Kerley lines and cephalized blood flow. No pleural effusion. Borderline hyperinflation. IMPRESSION: CHF pattern. Electronically Signed   By: Monte Fantasia M.D.   On: 01/19/2019 06:21   Discharge Instructions: Discharge Instructions    Diet - low sodium heart healthy   Complete by:  As directed    Discharge instructions   Complete by:  As directed    Mr. Dantre, [place] were hospitalized due to a GI bleed. I want you to stop taking aspirin. Continue taking brilinta. We have stopped your aspirin. I also want you to take Protonix 40 mg twice a  day for 8 weeks. Resume all of your other medications as prescribed.  Please make sure to follow up with both your PCP and GI. And thank you for allowing [place] to be a part of your care!   Increase activity slowly   Complete by:  As directed       Signed: Reba Hulett N, DO 01/22/2019, 2:54 PM

## 2019-01-22 NOTE — Plan of Care (Signed)
  Problem: Health Behavior/Discharge Planning: Goal: Ability to manage health-related needs will improve Outcome: Progressing   Problem: Clinical Measurements: Goal: Ability to maintain clinical measurements within normal limits will improve Outcome: Progressing Goal: Will remain free from infection Outcome: Progressing Goal: Diagnostic test results will improve Outcome: Progressing Goal: Respiratory complications will improve Outcome: Progressing Goal: Cardiovascular complication will be avoided Outcome: Progressing   Problem: Activity: Goal: Risk for activity intolerance will decrease Outcome: Progressing   Problem: Nutrition: Goal: Adequate nutrition will be maintained Outcome: Progressing   Problem: Coping: Goal: Level of anxiety will decrease Outcome: Progressing   Problem: Elimination: Goal: Will not experience complications related to bowel motility Outcome: Progressing Goal: Will not experience complications related to urinary retention Outcome: Progressing   Problem: Pain Managment: Goal: General experience of comfort will improve Outcome: Progressing   Problem: Safety: Goal: Ability to remain free from injury will improve Outcome: Progressing   Problem: Skin Integrity: Goal: Risk for impaired skin integrity will decrease Outcome: Progressing   Problem: Education: Goal: Ability to demonstrate management of disease process will improve Outcome: Progressing Goal: Ability to verbalize understanding of medication therapies will improve Outcome: Progressing Goal: Individualized Educational Video(s) Outcome: Progressing   Problem: Activity: Goal: Capacity to carry out activities will improve Outcome: Progressing   Problem: Cardiac: Goal: Ability to achieve and maintain adequate cardiopulmonary perfusion will improve Outcome: Progressing   

## 2019-01-22 NOTE — Op Note (Signed)
[facility] Patient Name: Howard Thompson Procedure Date : 01/22/2019 MRN: [id number] Attending MD: Thornton Park MD, MD Date of Birth: 1966/09/13 CSN: [id number] Age: 53 Admit Type: Inpatient Procedure:                Small bowel enteroscopy Indications:              Obscure gastrointestinal bleeding - one AVM seen on                            capsule endoscopy. Exam had to be performed on                            Brilinta. GI bleeding and acute blood loss anemia                            in August 2019, September 2019, and again now.                            History of duodenitis and nonbleeding duodenal AVM                            eradicated with APC and EndoClip. Providers:                Thornton Park MD, MD, Vista Lawman, RN, Laverda Sorenson, Technician, Clearnce Sorrel, CRNA Referring MD:              Medicines:                See the Anesthesia note for documentation of the                            administered medications Complications:            No immediate complications. Estimated blood loss:                            Minimal. Estimated Blood Loss:     Estimated blood loss was minimal. Procedure:                Pre-Anesthesia Assessment:                           - Prior to the procedure, a History and Physical                            was performed, and patient medications and                            allergies were reviewed. The patient's tolerance of                            previous anesthesia was also reviewed. The risks  and benefits of the procedure and the sedation                            options and risks were discussed with the patient.                            All questions were answered, and informed consent                            was obtained. Prior Anticoagulants: The patient has                            taken Brilinta, last dose was day of procedure. ASA                             Grade Assessment: III - A patient with severe                            systemic disease. After reviewing the risks and                            benefits, the patient was deemed in satisfactory                            condition to undergo the procedure.                           After obtaining informed consent, the endoscope was                            passed under direct vision. Throughout the                            procedure, the patient's blood pressure, pulse, and                            oxygen saturations were monitored continuously. The                            PCF-H190DL ([id number] Olympus pediatric colonoscope                            was introduced through the mouth and advanced to                            the jejunum, to the 180 cm mark (from the                            incisors). The small bowel enteroscopy was                            performed with moderate difficulty due to  significant looping. The patient tolerated the                            procedure well. Scope In: Scope Out: Findings:      There was LA Class A reflux esophagitis. Some blood was present at the       GE junction. The esophagus was otherwise normal.      The stomach was normal. No blood present.      Mild diffuse erythematous mucosa without active bleeding and with no       stigmata of bleeding was found in the duodenal bulb. No blood present.      A single small angioectasia with overlying fresh blood was found at 100       cm (from the incisors). There was additional bleeding with the first APC       treatment. However, further APC treatment resulted in control of the       bleeding. No other mucosal abnormalities were seen. No other blood       identified in the small bowel. Impression:               - LA Class A reflux esophagitis.                           - Normal stomach.                           - Erythematous  duodenopathy.                           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                           - No specimens collected. Recommendation:           - Clear liquid diet. Advance as tolerated.                           - Pantoprazole 40 mg BID x 8 weeks.                           - Continue serial hemoglobin/hematocrit with                            transfusion as indicated.                           - Discharge to home later today if he remains                            stable.                           - Consider repeat push enteroscopy APC therapy with                            additional bleeding in the future.                           -  Follow-up with Dr. Carlean Purl as an outpatient. Procedure Code(s):        --- Professional ---                           [phone], Small intestinal endoscopy, enteroscopy                            beyond second portion of duodenum, not including                            ileum; diagnostic, including collection of                            specimen(s) by brushing or washing, when performed                            (separate procedure) Diagnosis Code(s):        --- Professional ---                           K31.89, Other diseases of stomach and duodenum                           K55.21, Angiodysplasia of colon with hemorrhage                           K92.2, Gastrointestinal hemorrhage, unspecified CPT copyright 2019 American Medical Association. All rights reserved. The codes documented in this report are preliminary and upon coder review may  be revised to meet current compliance requirements. Thornton Park MD, MD 01/22/2019 10:27:29 AM This report has been signed electronically. Number of Addenda: 0

## 2019-01-23 ENCOUNTER — Encounter (HOSPITAL_COMMUNITY): Payer: Self-pay | Admitting: Gastroenterology

## 2019-03-14 ENCOUNTER — Emergency Department (HOSPITAL_COMMUNITY): Payer: 59

## 2019-03-14 ENCOUNTER — Observation Stay (HOSPITAL_COMMUNITY)
Admission: EM | Admit: 2019-03-14 | Discharge: 2019-03-15 | Disposition: A | Discharge status: Home / Self Care | Payer: 59 | Attending: Internal Medicine | Admitting: Internal Medicine

## 2019-03-14 ENCOUNTER — Other Ambulatory Visit: Payer: Self-pay

## 2019-03-14 ENCOUNTER — Encounter (HOSPITAL_COMMUNITY): Payer: Self-pay | Admitting: Family Medicine

## 2019-03-14 DIAGNOSIS — F17290 Nicotine dependence, other tobacco product, uncomplicated: Secondary | ICD-10-CM | POA: Diagnosis not present

## 2019-03-14 DIAGNOSIS — D509 Iron deficiency anemia, unspecified: Secondary | ICD-10-CM | POA: Diagnosis not present

## 2019-03-14 DIAGNOSIS — G7000 Myasthenia gravis without (acute) exacerbation: Secondary | ICD-10-CM | POA: Insufficient documentation

## 2019-03-14 DIAGNOSIS — F17210 Nicotine dependence, cigarettes, uncomplicated: Secondary | ICD-10-CM | POA: Diagnosis not present

## 2019-03-14 DIAGNOSIS — Z8674 Personal history of sudden cardiac arrest: Secondary | ICD-10-CM | POA: Insufficient documentation

## 2019-03-14 DIAGNOSIS — I255 Ischemic cardiomyopathy: Secondary | ICD-10-CM | POA: Diagnosis not present

## 2019-03-14 DIAGNOSIS — F1210 Cannabis abuse, uncomplicated: Secondary | ICD-10-CM | POA: Insufficient documentation

## 2019-03-14 DIAGNOSIS — Z955 Presence of coronary angioplasty implant and graft: Secondary | ICD-10-CM | POA: Diagnosis not present

## 2019-03-14 DIAGNOSIS — F1010 Alcohol abuse, uncomplicated: Secondary | ICD-10-CM | POA: Diagnosis present

## 2019-03-14 DIAGNOSIS — F1910 Other psychoactive substance abuse, uncomplicated: Secondary | ICD-10-CM | POA: Diagnosis not present

## 2019-03-14 DIAGNOSIS — I110 Hypertensive heart disease with heart failure: Secondary | ICD-10-CM | POA: Diagnosis not present

## 2019-03-14 DIAGNOSIS — E785 Hyperlipidemia, unspecified: Secondary | ICD-10-CM | POA: Insufficient documentation

## 2019-03-14 DIAGNOSIS — I5021 Acute systolic (congestive) heart failure: Secondary | ICD-10-CM

## 2019-03-14 DIAGNOSIS — I5023 Acute on chronic systolic (congestive) heart failure: Secondary | ICD-10-CM | POA: Diagnosis not present

## 2019-03-14 DIAGNOSIS — F1310 Sedative, hypnotic or anxiolytic abuse, uncomplicated: Secondary | ICD-10-CM | POA: Diagnosis not present

## 2019-03-14 DIAGNOSIS — I739 Peripheral vascular disease, unspecified: Secondary | ICD-10-CM | POA: Diagnosis not present

## 2019-03-14 DIAGNOSIS — Z79899 Other long term (current) drug therapy: Secondary | ICD-10-CM | POA: Diagnosis not present

## 2019-03-14 DIAGNOSIS — I10 Essential (primary) hypertension: Secondary | ICD-10-CM | POA: Diagnosis present

## 2019-03-14 DIAGNOSIS — Z1159 Encounter for screening for other viral diseases: Secondary | ICD-10-CM | POA: Insufficient documentation

## 2019-03-14 DIAGNOSIS — J449 Chronic obstructive pulmonary disease, unspecified: Secondary | ICD-10-CM | POA: Diagnosis not present

## 2019-03-14 DIAGNOSIS — F1410 Cocaine abuse, uncomplicated: Secondary | ICD-10-CM | POA: Insufficient documentation

## 2019-03-14 DIAGNOSIS — F1510 Other stimulant abuse, uncomplicated: Secondary | ICD-10-CM | POA: Diagnosis not present

## 2019-03-14 DIAGNOSIS — I2510 Atherosclerotic heart disease of native coronary artery without angina pectoris: Secondary | ICD-10-CM | POA: Diagnosis not present

## 2019-03-14 DIAGNOSIS — I252 Old myocardial infarction: Secondary | ICD-10-CM | POA: Diagnosis not present

## 2019-03-14 HISTORY — DX: Heart failure, unspecified: I50.9

## 2019-03-14 LAB — CBC
HCT: 35.5 % — ABNORMAL LOW (ref 39.0–52.0)
Hemoglobin: 10.3 g/dL — ABNORMAL LOW (ref 13.0–17.0)
MCH: 21.5 pg — ABNORMAL LOW (ref 26.0–34.0)
MCHC: 29 g/dL — ABNORMAL LOW (ref 30.0–36.0)
MCV: 74 fL — ABNORMAL LOW (ref 80.0–100.0)
Platelets: 389 10*3/uL (ref 150–400)
RBC: 4.8 MIL/uL (ref 4.22–5.81)
RDW: 22.9 % — ABNORMAL HIGH (ref 11.5–15.5)
WBC: 7.3 10*3/uL (ref 4.0–10.5)
nRBC: 0.3 % — ABNORMAL HIGH (ref 0.0–0.2)

## 2019-03-14 LAB — BASIC METABOLIC PANEL
Anion gap: 11 (ref 5–15)
BUN: 16 mg/dL (ref 6–20)
CO2: 24 mmol/L (ref 22–32)
Calcium: 9.7 mg/dL (ref 8.9–10.3)
Chloride: 106 mmol/L (ref 98–111)
Creatinine, Ser: 0.96 mg/dL (ref 0.61–1.24)
GFR calc Af Amer: >60 mL/min (ref >60)
GFR calc non Af Amer: >60 mL/min (ref >60)
Glucose, Bld: 96 mg/dL (ref 70–99)
Potassium: 4.7 mmol/L (ref 3.5–5.1)
Sodium: 141 mmol/L (ref 135–145)

## 2019-03-14 LAB — BRAIN NATRIURETIC PEPTIDE: B Natriuretic Peptide: 753.8 pg/mL — ABNORMAL HIGH (ref 0.0–100.0)

## 2019-03-14 LAB — TROPONIN I: Troponin I: 0.03 ng/mL (ref <0.03)

## 2019-03-14 LAB — SARS CORONAVIRUS 2 BY RT PCR (HOSPITAL ORDER, PERFORMED IN ~~LOC~~ HOSPITAL LAB): SARS Coronavirus 2: NEGATIVE

## 2019-03-14 MED ORDER — TICAGRELOR 90 MG PO TABS
90.0000 mg | ORAL_TABLET | Freq: Two times a day (BID) | ORAL | Status: DC
  Administered 2019-03-15 (×2): 90 mg via ORAL
  Filled 2019-03-14 (×2): qty 1

## 2019-03-14 MED ORDER — THIAMINE HCL 100 MG/ML IJ SOLN
100.0000 mg | Freq: Every day | INTRAMUSCULAR | Status: DC
  Filled 2019-03-14: qty 2

## 2019-03-14 MED ORDER — ADULT MULTIVITAMIN W/MINERALS CH
1.0000 | ORAL_TABLET | Freq: Every day | ORAL | Status: DC
  Administered 2019-03-15 (×2): 1 via ORAL
  Filled 2019-03-14 (×2): qty 1

## 2019-03-14 MED ORDER — ENOXAPARIN SODIUM 40 MG/0.4ML ~~LOC~~ SOLN
40.0000 mg | SUBCUTANEOUS | Status: DC
  Administered 2019-03-15: 40 mg via SUBCUTANEOUS
  Filled 2019-03-14: qty 0.4

## 2019-03-14 MED ORDER — VITAMIN B-1 100 MG PO TABS
100.0000 mg | ORAL_TABLET | Freq: Every day | ORAL | Status: DC
  Administered 2019-03-15 (×2): 100 mg via ORAL
  Filled 2019-03-14 (×2): qty 1

## 2019-03-14 MED ORDER — ACETAMINOPHEN 325 MG PO TABS: 650.0000 mg | ORAL_TABLET | ORAL | Status: DC | PRN

## 2019-03-14 MED ORDER — SODIUM CHLORIDE 0.9% FLUSH
3.0000 mL | Freq: Once | INTRAVENOUS | Status: AC
  Administered 2019-03-14: 3 mL via INTRAVENOUS

## 2019-03-14 MED ORDER — FUROSEMIDE 10 MG/ML IJ SOLN
40.0000 mg | Freq: Two times a day (BID) | INTRAMUSCULAR | Status: DC
  Administered 2019-03-15: 40 mg via INTRAVENOUS
  Filled 2019-03-14: qty 4

## 2019-03-14 MED ORDER — AMLODIPINE BESYLATE 5 MG PO TABS
5.0000 mg | ORAL_TABLET | Freq: Every day | ORAL | Status: DC
  Administered 2019-03-15: 5 mg via ORAL
  Filled 2019-03-14: qty 1

## 2019-03-14 MED ORDER — SODIUM CHLORIDE 0.9 % IV SOLN: 250.0000 mL | INTRAVENOUS | Status: DC | PRN

## 2019-03-14 MED ORDER — SODIUM CHLORIDE 0.9% FLUSH
3.0000 mL | Freq: Two times a day (BID) | INTRAVENOUS | Status: DC
  Administered 2019-03-15 (×2): 3 mL via INTRAVENOUS

## 2019-03-14 MED ORDER — BUPROPION HCL ER (SR) 150 MG PO TB12
150.0000 mg | ORAL_TABLET | Freq: Two times a day (BID) | ORAL | Status: DC
  Administered 2019-03-15 (×2): 150 mg via ORAL
  Filled 2019-03-14 (×2): qty 1

## 2019-03-14 MED ORDER — FOLIC ACID 1 MG PO TABS
1.0000 mg | ORAL_TABLET | Freq: Every day | ORAL | Status: DC
  Administered 2019-03-15 (×2): 1 mg via ORAL
  Filled 2019-03-14 (×2): qty 1

## 2019-03-14 MED ORDER — CARVEDILOL 3.125 MG PO TABS
3.1250 mg | ORAL_TABLET | Freq: Two times a day (BID) | ORAL | Status: DC
  Administered 2019-03-15: 3.125 mg via ORAL
  Filled 2019-03-14: qty 1

## 2019-03-14 MED ORDER — LORAZEPAM 2 MG/ML IJ SOLN: 1.0000 mg | Freq: Four times a day (QID) | INTRAMUSCULAR | Status: DC | PRN

## 2019-03-14 MED ORDER — LOSARTAN POTASSIUM 50 MG PO TABS
25.0000 mg | ORAL_TABLET | Freq: Every day | ORAL | Status: DC
  Administered 2019-03-15: 25 mg via ORAL
  Filled 2019-03-14: qty 1

## 2019-03-14 MED ORDER — ONDANSETRON HCL 4 MG/2ML IJ SOLN: 4.0000 mg | Freq: Four times a day (QID) | INTRAMUSCULAR | Status: DC | PRN

## 2019-03-14 MED ORDER — LORAZEPAM 1 MG PO TABS: 0.0000 mg | ORAL_TABLET | Freq: Two times a day (BID) | ORAL | Status: DC

## 2019-03-14 MED ORDER — LORAZEPAM 1 MG PO TABS: 1.0000 mg | ORAL_TABLET | Freq: Four times a day (QID) | ORAL | Status: DC | PRN

## 2019-03-14 MED ORDER — FUROSEMIDE 10 MG/ML IJ SOLN
60.0000 mg | Freq: Once | INTRAMUSCULAR | Status: AC
  Administered 2019-03-14: 19:00:00 60 mg via INTRAVENOUS
  Filled 2019-03-14: qty 6

## 2019-03-14 MED ORDER — ATORVASTATIN CALCIUM 40 MG PO TABS
40.0000 mg | ORAL_TABLET | Freq: Every day | ORAL | Status: DC
  Administered 2019-03-15: 40 mg via ORAL
  Filled 2019-03-14: qty 1

## 2019-03-14 MED ORDER — SODIUM CHLORIDE 0.9% FLUSH: 3.0000 mL | INTRAVENOUS | Status: DC | PRN

## 2019-03-14 MED ORDER — PANTOPRAZOLE SODIUM 40 MG PO TBEC
40.0000 mg | DELAYED_RELEASE_TABLET | Freq: Two times a day (BID) | ORAL | Status: DC
  Administered 2019-03-15 (×2): 40 mg via ORAL
  Filled 2019-03-14 (×2): qty 1

## 2019-03-14 MED ORDER — LORAZEPAM 1 MG PO TABS
0.0000 mg | ORAL_TABLET | Freq: Four times a day (QID) | ORAL | Status: DC
  Administered 2019-03-15: 1 mg via ORAL
  Filled 2019-03-14: qty 1

## 2019-03-14 NOTE — H&P (Addendum)
History and Physical    Howard Thompson WVP:[id number] DOB: 03-30-1966 DOA: 03/14/2019  PCP: Jamesetta Geralds, MD   Patient coming from: Home   Chief Complaint: SOB, fatigue  HPI: Howard Thompson is a 53 y.o. male with medical history significant for polysubstance abuse, coronary artery disease, upper GI bleeding, hypertension, and chronic systolic CHF, now presenting to emergency department for evaluation of shortness of breath and generalized weakness/fatigue.  Patient reports that he been in his usual state of health until approximately 4 days ago when he noted the insidious development of shortness of breath and generalized weakness.  Since that time, he has become progressively weak in general and dyspneic with the slightest exertion.  He also reports waking several times a night gasping for air.  He denies any fevers, chills, or chest pain.  He has had a mild nonproductive cough.  Denies any lower extremity swelling or tenderness.  He is prescribed 60 mg of Lasix daily, but due to some confusion has only been taking 20 mg daily.  ED Course: Upon arrival to the ED, patient is found to be afebrile, saturating well on room air, mildly tachypneic, slightly tachycardic, and hypertensive to 140/115.  EKG features a sinus rhythm and chest x-ray is notable for pulmonary vascular congestion and mild interstitial edema.  Chemistry panel is unremarkable.  CBC is notable for an improved microcytic anemia, troponin is 0.03, and BNP is elevated to 754.  Patient was given 60 mg IV Lasix in the ED and will be observed for ongoing evaluation and management.  Review of Systems:  All other systems reviewed and apart from HPI, are negative.  Past Medical History:  Diagnosis Date  . Acute renal failure ([place])    a. a. during 7-05/2018 adm for cardiac arrest, requiring HD temporarily.  . Alcohol abuse   . Anemia   . AVM (arteriovenous malformation) of colon   . Cardiac arrest ([place]) 05/22/2018   Vfib  .  Cocaine abuse ([place])   . COPD (chronic obstructive pulmonary disease) ([place])   . Coronary artery disease    a. hx of POBA in [place]. b. out-of-hospital cardiac arrest 04/2018 (vfib/STEMI) s/p DES to prox LAD (complex admission with GIB, renal failure requiring HD, encephalopathy). c. STEMI 11/2018 after pt stopped Plavix - s/p DES to LAD.  Marland Kitchen Duodenitis   . GI bleed   . History of blood transfusion   . Hyperlipidemia   . Hypokalemia   . Hypomagnesemia   . Iron deficiency anemia   . Ischemic cardiomyopathy    a. EF <25% at time of cath/arrest 04/2018, improved to 40-45% by f/u echo 06/09/18.  . Myasthenia gravis ([place])   . Myocardial infarction ([place]) 05/22/2018   STEMI  . PAD (peripheral artery disease) ([place])    a. femoral bypass  . Shock liver    a. during 7-05/2018 adm for cardiac arrest and again in 11/2018.  Marland Kitchen Thrombocytopenia ([place]) 04/2018  . Tobacco abuse   . Ventricular fibrillation [facility])     Past Surgical History:  Procedure Laterality Date  . ATHERECTOMY    . BIOPSY  07/16/2018   Procedure: BIOPSY;  Surgeon: Gatha Mayer, MD;  Location: [facility] ENDOSCOPY;  Service: Endoscopy;;  . CARDIAC CATHETERIZATION  05/22/2018   stent  . COLONOSCOPY WITH PROPOFOL N/A 07/16/2018   Procedure: COLONOSCOPY WITH PROPOFOL;  Surgeon: Gatha Mayer, MD;  Location: [facility] ENDOSCOPY;  Service: Endoscopy;  Laterality: N/A;  . CORONARY ANGIOPLASTY    . CORONARY STENT INTERVENTION N/A  05/22/2018   Procedure: CORONARY STENT INTERVENTION;  Surgeon: Lorretta Harp, MD;  Location: [place] CV LAB;  Service: Cardiovascular;  Laterality: N/A;  . CORONARY/GRAFT ACUTE MI REVASCULARIZATION N/A 12/16/2018   Procedure: CORONARY/GRAFT ACUTE MI REVASCULARIZATION;  Surgeon: Troy Sine, MD;  Location: [place] CV LAB;  Service: Cardiovascular;  Laterality: N/A;  . ENTEROSCOPY N/A 01/22/2019   Procedure: ENTEROSCOPY;  Surgeon: Thornton Park, MD;  Location: [place];  Service: Gastroenterology;  Laterality:  N/A;  . ESOPHAGOGASTRODUODENOSCOPY (EGD) WITH PROPOFOL N/A 06/10/2018   Procedure: ESOPHAGOGASTRODUODENOSCOPY (EGD) WITH PROPOFOL;  Surgeon: Ladene Artist, MD;  Location: [facility] ENDOSCOPY;  Service: Endoscopy;  Laterality: N/A;  . ESOPHAGOGASTRODUODENOSCOPY (EGD) WITH PROPOFOL N/A 07/16/2018   Procedure: ESOPHAGOGASTRODUODENOSCOPY (EGD) WITH PROPOFOL;  Surgeon: Gatha Mayer, MD;  Location: [place];  Service: Endoscopy;  Laterality: N/A;  . FEMORAL ARTERY - POPLITEAL ARTERY BYPASS GRAFT    . GIVENS CAPSULE STUDY N/A 01/20/2019   Procedure: GIVENS CAPSULE STUDY;  Surgeon: Gatha Mayer, MD;  Location: [facility] ENDOSCOPY;  Service: Endoscopy;  Laterality: N/A;  . HOT HEMOSTASIS N/A 07/16/2018   Procedure: HOT HEMOSTASIS (ARGON PLASMA COAGULATION/BICAP);  Surgeon: Gatha Mayer, MD;  Location: [facility] ENDOSCOPY;  Service: Endoscopy;  Laterality: N/A;  . HOT HEMOSTASIS N/A 01/22/2019   Procedure: HOT HEMOSTASIS (ARGON PLASMA COAGULATION/BICAP);  Surgeon: Thornton Park, MD;  Location: [place];  Service: Gastroenterology;  Laterality: N/A;  . IABP INSERTION N/A 12/16/2018   Procedure: IABP Insertion;  Surgeon: Troy Sine, MD;  Location: [place] CV LAB;  Service: Cardiovascular;  Laterality: N/A;  . IR FLUORO GUIDE CV LINE RIGHT  05/31/2018  . IR REMOVAL TUN CV CATH W/O FL  06/21/2018  . IR US GUIDE VASC ACCESS RIGHT  05/31/2018  . LEFT HEART CATH AND CORONARY ANGIOGRAPHY N/A 05/22/2018   Procedure: LEFT HEART CATH AND CORONARY ANGIOGRAPHY;  Surgeon: Lorretta Harp, MD;  Location: [place] CV LAB;  Service: Cardiovascular;  Laterality: N/A;  . LEFT HEART CATH AND CORONARY ANGIOGRAPHY N/A 12/16/2018   Procedure: LEFT HEART CATH AND CORONARY ANGIOGRAPHY;  Surgeon: Troy Sine, MD;  Location: [place] CV LAB;  Service: Cardiovascular;  Laterality: N/A;     reports that he has been smoking cigarettes. He has been smoking about 1.00 pack per day. He uses smokeless tobacco. He reports  current alcohol use. He reports current drug use. Drugs: Cocaine, Marijuana, Methamphetamines, and Benzodiazepines.  No Known Allergies  Family History  Problem Relation Age of Onset  . Alzheimer's disease Father      Prior to Admission medications   Medication Sig Start Date End Date Taking? Authorizing Provider  amLODipine (NORVASC) 5 MG tablet Take 5 mg by mouth daily. 02/20/19  Yes [provider]  atorvastatin (LIPITOR) 40 MG tablet Take 40 mg by mouth daily. 02/20/19  Yes [provider]  buPROPion (WELLBUTRIN SR) 150 MG 12 hr tablet Take 1 tablet (150 mg total) by mouth 2 (two) times daily. 01/19/19  Yes Dunn, Dayna N, PA-C  carvedilol (COREG) 3.125 MG tablet Take 1 tablet (3.125 mg total) by mouth 2 (two) times daily with a meal. 01/19/19  Yes Dunn, Dayna N, PA-C  folic acid (FOLVITE) 1 MG tablet Take 1 tablet (1 mg total) by mouth daily. 01/19/19  Yes Dunn, Dayna N, PA-C  furosemide (LASIX) 20 MG tablet Take 3 tablets (60 mg total) by mouth daily. Patient taking differently: Take 20 mg by mouth daily.  01/23/19  Yes  Rehman, Areeg N, DO  losartan (COZAAR) 25 MG tablet Take 1 tablet (25 mg total) by mouth daily. 01/19/19  Yes Dunn, Dayna N, PA-C  multivitamin (RENA-VIT) TABS tablet Take 1 tablet by mouth at bedtime. 02/20/19  Yes [provider]  nitroGLYCERIN (NITROSTAT) 0.4 MG SL tablet Place 0.4 mg under the tongue every 5 (five) minutes as needed for chest pain.   Yes [provider]  pantoprazole (PROTONIX) 40 MG tablet Take 1 tablet (40 mg total) by mouth 2 (two) times daily. 01/22/19 03/19/19 Yes Rehman, Areeg N, DO  potassium chloride SA (K-DUR,KLOR-CON) 20 MEQ tablet Take 1 tablet (20 mEq total) by mouth daily. 01/19/19  Yes Dunn, Nedra Hai, PA-C  ticagrelor (BRILINTA) 90 MG TABS tablet Take 1 tablet (90 mg total) by mouth 2 (two) times daily. 01/19/19  Yes Dunn, Dayna N, PA-C  Multiple Vitamin (MULTIVITAMIN WITH MINERALS) TABS tablet Take 1 tablet by  mouth daily. Patient not taking: Reported on 03/14/2019 07/17/18   Hosie Poisson, MD  spironolactone (ALDACTONE) 25 MG tablet Take 0.5 tablets (12.5 mg total) by mouth daily. Patient not taking: Reported on 03/14/2019 12/21/18   Charlie Pitter, PA-C    Physical Exam: Vitals:   03/14/19 1815 03/14/19 1830 03/14/19 1845 03/14/19 1900  BP: (!) 113/100 (!) 128/98 125/84 (!) 136/117  Pulse: (!) 110 (!) 102 100 (!) 106  Resp: 18 (!) 25 15 (!) 21  Temp:      TempSrc:      SpO2: 100% 100% 100% 99%  Weight:      Height:         Constitutional: NAD, calm  Eyes: PERTLA, lids and conjunctivae normal ENMT: Mucous membranes are moist. Posterior pharynx clear of any exudate or lesions.   Neck: normal, supple, no masses, no thyromegaly Respiratory: Mild tachypnea, dyspnea with speech. no wheezing, no rhonchi. No pallor or cyanosis.  Cardiovascular: S1 & S2 heard, regular rate and rhythm. No extremity edema. JVP 9 mm H2O. Abdomen: No distension, no tenderness, soft. Bowel sounds normal.  Musculoskeletal: no clubbing / cyanosis. No joint deformity upper and lower extremities.   Skin: no significant rashes, lesions, ulcers. Warm, dry, well-perfused. Neurologic: CN 2-12 grossly intact. Sensation intact. Strength 5/5 in all 4 limbs.  Psychiatric: Alert and oriented x 3. Calm, cooperative.    Labs on Admission: I have personally reviewed following labs and imaging studies  CBC: Recent Labs  Lab 03/14/19 1534  WBC 7.3  HGB 10.3*  HCT 35.5*  MCV 74.0*  PLT 242   Basic Metabolic Panel: Recent Labs  Lab 03/14/19 1534  NA 141  K 4.7  CL 106  CO2 24  GLUCOSE 96  BUN 16  CREATININE 0.96  CALCIUM 9.7   GFR: Estimated Creatinine Clearance: 90 mL/min (by C-G formula based on SCr of 0.96 mg/dL). Liver Function Tests: No results for input(s): AST, ALT, ALKPHOS, BILITOT, PROT, ALBUMIN in the last 168 hours. No results for input(s): LIPASE, AMYLASE in the last 168 hours. No results for  input(s): AMMONIA in the last 168 hours. Coagulation Profile: No results for input(s): INR, PROTIME in the last 168 hours. Cardiac Enzymes: Recent Labs  Lab 03/14/19 1534  TROPONINI 0.03*   BNP (last 3 results) No results for input(s): PROBNP in the last 8760 hours. HbA1C: No results for input(s): HGBA1C in the last 72 hours. CBG: No results for input(s): GLUCAP in the last 168 hours. Lipid Profile: No results for input(s): CHOL, HDL, LDLCALC, TRIG, CHOLHDL, LDLDIRECT  in the last 72 hours. Thyroid Function Tests: No results for input(s): TSH, T4TOTAL, FREET4, T3FREE, THYROIDAB in the last 72 hours. Anemia Panel: No results for input(s): VITAMINB12, FOLATE, FERRITIN, TIBC, IRON, RETICCTPCT in the last 72 hours. Urine analysis:    Component Value Date/Time   COLORURINE YELLOW 05/22/2018 2024   APPEARANCEUR CLEAR 05/22/2018 2024   LABSPEC >1.046 (H) 05/22/2018 2024   PHURINE 5.0 05/22/2018 2024   GLUCOSEU NEGATIVE 05/22/2018 2024   HGBUR LARGE (A) 05/22/2018 2024   BILIRUBINUR NEGATIVE 05/22/2018 2024   KETONESUR NEGATIVE 05/22/2018 2024   PROTEINUR 30 (A) 05/22/2018 2024   NITRITE NEGATIVE 05/22/2018 2024   LEUKOCYTESUR NEGATIVE 05/22/2018 2024   Sepsis Labs: @LABRCNTIP (procalcitonin:4,lacticidven:4) ) Recent Results (from the past 240 hour(s))  SARS Coronavirus 2 (CEPHEID - Performed in [place] hospital lab), Hosp Order     Status: None   Collection Time: 03/14/19  4:59 PM  Result Value Ref Range Status   SARS Coronavirus 2 NEGATIVE NEGATIVE Final    Comment: (NOTE) If result is NEGATIVE SARS-CoV-2 target nucleic acids are NOT DETECTED. The SARS-CoV-2 RNA is generally detectable in upper and lower  respiratory specimens during the acute phase of infection. The lowest  concentration of SARS-CoV-2 viral copies this assay can detect is 250  copies / mL. A negative result does not preclude SARS-CoV-2 infection  and should not be used as the sole basis for treatment  or other  patient management decisions.  A negative result may occur with  improper specimen collection / handling, submission of specimen other  than nasopharyngeal swab, presence of viral mutation(s) within the  areas targeted by this assay, and inadequate number of viral copies  (<250 copies / mL). A negative result must be combined with clinical  observations, patient history, and epidemiological information. If result is POSITIVE SARS-CoV-2 target nucleic acids are DETECTED. The SARS-CoV-2 RNA is generally detectable in upper and lower  respiratory specimens dur ing the acute phase of infection.  Positive  results are indicative of active infection with SARS-CoV-2.  Clinical  correlation with patient history and other diagnostic information is  necessary to determine patient infection status.  Positive results do  not rule out bacterial infection or co-infection with other viruses. If result is PRESUMPTIVE POSTIVE SARS-CoV-2 nucleic acids MAY BE PRESENT.   A presumptive positive result was obtained on the submitted specimen  and confirmed on repeat testing.  While 2019 novel coronavirus  (SARS-CoV-2) nucleic acids may be present in the submitted sample  additional confirmatory testing may be necessary for epidemiological  and / or clinical management purposes  to differentiate between  SARS-CoV-2 and other Sarbecovirus currently known to infect humans.  If clinically indicated additional testing with an alternate test  methodology [phone]) is advised. The SARS-CoV-2 RNA is generally  detectable in upper and lower respiratory sp ecimens during the acute  phase of infection. The expected result is Negative. Fact Sheet for Patients:  StrictlyIdeas.no Fact Sheet for Healthcare Providers: BankingDealers.co.za This test is not yet approved or cleared by the [place] FDA and has been authorized for detection and/or diagnosis of  SARS-CoV-2 by FDA under an Emergency Use Authorization (EUA).  This EUA will remain in effect (meaning this test can be used) for the duration of the COVID-19 declaration under Section 564(b)(1) of the Act, 21 U.S.C. section 360bbb-3(b)(1), unless the authorization is terminated or revoked sooner. Performed at [facility], [place] [address] [place], [place] [address]  Radiological Exams on Admission: Dg Chest 2 View  Result Date: 03/14/2019 CLINICAL DATA:  Shortness breath EXAM: CHEST - 2 VIEW COMPARISON:  January 19, 2019 FINDINGS: There is mild interstitial pulmonary edema. There is no airspace consolidation. There is mild cardiomegaly with mild pulmonary venous hypertension. No adenopathy. No bone lesions. Patient is status post median sternotomy. No bone lesions. IMPRESSION: There is a degree of pulmonary vascular congestion with mild interstitial edema. No consolidation. There is suspected degree of congestive heart failure. Electronically Signed   By: Lowella Grip III M.D.   On: 03/14/2019 15:57    EKG: Independently reviewed. Sinus rhythm.   Assessment/Plan   1. Acute on chronic systolic CHF  - Presents with several days of SOB, gen weakness, and PND  - Found to have elevated BNP, JVD, and CXR-findings concerning for acute CHF  - Echo from February 2020 demonstrated EF 20-25%  - He had been taking 20 mg Lasix daily instead of the prescribed 60 mg qD  - Treated with Lasix 60 mg IV in ED and has begun to diurese  - Continue diuresis with Lasix 40 mg IV q12h, follow daily wt and I/O's, continue ARB and beta-blocker    2. CAD - No anginal complaints, troponin slightly elevated in setting of acute CHF with respiratory distress  - Continue statin, beta-blocker, and Brilinta   3. Iron-deficiency anemia  - Hgb is improved to 10.3 and there is no active bleeding    4. Drug and alcohol abuse  - Monitor with CIWA, supplement vitamins, use Ativan as-needed    PPE: Mask,  face shield  DVT prophylaxis: Lovenox  Code Status: Full  Family Communication: Discussed with patient  Consults called: None Admission status: Observation     Vianne Bulls, MD [facility] Pager [phone]  If 7PM-7AM, please contact night-coverage [email or web address] Password [facility]  03/14/2019, 7:22 PM

## 2019-03-14 NOTE — ED Provider Notes (Signed)
[place] EMERGENCY DEPARTMENT Provider Note   CSN: [id number] Arrival date & time: 03/14/19  1450    History   Chief Complaint Chief Complaint  Patient presents with  . Shortness of Breath  . Weakness    HPI Howard Thompson is a 53 y.o. male.     HPI  53 year old male comes in a chief complaint of shortness of breath and weakness.  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  He reports that over the last week or so has been having increasing weakness and shortness of breath.  Patient is unable to sleep at night because of his shortness of breath.  He often wakes up, gasping for air.  He also reports that minimal ambulation within his house gets him short of breath.  There is no associated chest pain.  He also has not seen any dark tarry stools.  Of note, patient states that he has been taking his medications as prescribed, but upon further questioning it seems like he is taking 20 mg of Lasix and perhaps not 60 mg twice a day as prescribed.  Past Medical History:  Diagnosis Date  . Acute renal failure ([place])    a. a. during 7-05/2018 adm for cardiac arrest, requiring HD temporarily.  . Alcohol abuse   . Anemia   . AVM (arteriovenous malformation) of colon   . Cardiac arrest ([place]) 05/22/2018   Vfib  . Cocaine abuse ([place])   . COPD (chronic obstructive pulmonary disease) ([place])   . Coronary artery disease    a. hx of POBA in [place]. b. out-of-hospital cardiac arrest 04/2018 (vfib/STEMI) s/p DES to prox LAD (complex admission with GIB, renal failure requiring HD, encephalopathy). c. STEMI 11/2018 after pt stopped Plavix - s/p DES to LAD.  Marland Kitchen Duodenitis   . GI bleed   . History of blood transfusion   . Hyperlipidemia   . Hypokalemia   . Hypomagnesemia   . Iron deficiency anemia   . Ischemic cardiomyopathy    a. EF <25% at time of cath/arrest  04/2018, improved to 40-45% by f/u echo 06/09/18.  . Myasthenia gravis ([place])   . Myocardial infarction ([place]) 05/22/2018   STEMI  . PAD (peripheral artery disease) ([place])    a. femoral bypass  . Shock liver    a. during 7-05/2018 adm for cardiac arrest and again in 11/2018.  Marland Kitchen Thrombocytopenia ([place]) 04/2018  . Tobacco abuse   . Ventricular fibrillation [facility] [place] [facility])     Patient Active Problem List   Diagnosis Date Noted  . Acute combined systolic and diastolic heart failure ([place])   . AVM (arteriovenous malformation) of small bowel, acquired with hemorrhage   . GI bleeding 01/19/2019  . Iron deficiency anemia 12/20/2018  . CAD in native artery 12/20/2018  . Acute blood loss anemia   . Antiplatelet or antithrombotic long-term use   . Anemia due to blood loss 07/14/2018  . Protein-calorie malnutrition, severe 06/11/2018  . Anemia due to chronic blood loss   . Hyperlipidemia LDL goal <70   . Ischemic cardiomyopathy     Past Surgical History:  Procedure Laterality Date  . ATHERECTOMY    . BIOPSY  07/16/2018   Procedure: BIOPSY;  Surgeon: Gatha Mayer, MD;  Location: [facility] ENDOSCOPY;  Service: Endoscopy;;  . CARDIAC CATHETERIZATION  05/22/2018   stent  . COLONOSCOPY WITH PROPOFOL N/A 07/16/2018   Procedure:  COLONOSCOPY WITH PROPOFOL;  Surgeon: Gatha Mayer, MD;  Location: [facility] ENDOSCOPY;  Service: Endoscopy;  Laterality: N/A;  . CORONARY ANGIOPLASTY    . CORONARY STENT INTERVENTION N/A 05/22/2018   Procedure: CORONARY STENT INTERVENTION;  Surgeon: Lorretta Harp, MD;  Location: [place] CV LAB;  Service: Cardiovascular;  Laterality: N/A;  . CORONARY/GRAFT ACUTE MI REVASCULARIZATION N/A 12/16/2018   Procedure: CORONARY/GRAFT ACUTE MI REVASCULARIZATION;  Surgeon: Troy Sine, MD;  Location: [place] CV LAB;  Service: Cardiovascular;  Laterality: N/A;  . ENTEROSCOPY N/A 01/22/2019   Procedure: ENTEROSCOPY;  Surgeon: Thornton Park, MD;  Location: [place];  Service:  Gastroenterology;  Laterality: N/A;  . ESOPHAGOGASTRODUODENOSCOPY (EGD) WITH PROPOFOL N/A 06/10/2018   Procedure: ESOPHAGOGASTRODUODENOSCOPY (EGD) WITH PROPOFOL;  Surgeon: Ladene Artist, MD;  Location: [facility] ENDOSCOPY;  Service: Endoscopy;  Laterality: N/A;  . ESOPHAGOGASTRODUODENOSCOPY (EGD) WITH PROPOFOL N/A 07/16/2018   Procedure: ESOPHAGOGASTRODUODENOSCOPY (EGD) WITH PROPOFOL;  Surgeon: Gatha Mayer, MD;  Location: [place];  Service: Endoscopy;  Laterality: N/A;  . FEMORAL ARTERY - POPLITEAL ARTERY BYPASS GRAFT    . GIVENS CAPSULE STUDY N/A 01/20/2019   Procedure: GIVENS CAPSULE STUDY;  Surgeon: Gatha Mayer, MD;  Location: [facility] ENDOSCOPY;  Service: Endoscopy;  Laterality: N/A;  . HOT HEMOSTASIS N/A 07/16/2018   Procedure: HOT HEMOSTASIS (ARGON PLASMA COAGULATION/BICAP);  Surgeon: Gatha Mayer, MD;  Location: [facility] ENDOSCOPY;  Service: Endoscopy;  Laterality: N/A;  . HOT HEMOSTASIS N/A 01/22/2019   Procedure: HOT HEMOSTASIS (ARGON PLASMA COAGULATION/BICAP);  Surgeon: Thornton Park, MD;  Location: [place];  Service: Gastroenterology;  Laterality: N/A;  . IABP INSERTION N/A 12/16/2018   Procedure: IABP Insertion;  Surgeon: Troy Sine, MD;  Location: [place] CV LAB;  Service: Cardiovascular;  Laterality: N/A;  . IR FLUORO GUIDE CV LINE RIGHT  05/31/2018  . IR REMOVAL TUN CV CATH W/O FL  06/21/2018  . IR US GUIDE VASC ACCESS RIGHT  05/31/2018  . LEFT HEART CATH AND CORONARY ANGIOGRAPHY N/A 05/22/2018   Procedure: LEFT HEART CATH AND CORONARY ANGIOGRAPHY;  Surgeon: Lorretta Harp, MD;  Location: [place] CV LAB;  Service: Cardiovascular;  Laterality: N/A;  . LEFT HEART CATH AND CORONARY ANGIOGRAPHY N/A 12/16/2018   Procedure: LEFT HEART CATH AND CORONARY ANGIOGRAPHY;  Surgeon: Troy Sine, MD;  Location: [place] CV LAB;  Service: Cardiovascular;  Laterality: N/A;        Home Medications    Prior to Admission medications   Medication Sig Start Date End Date  Taking? Authorizing Provider  amLODipine (NORVASC) 5 MG tablet Take 5 mg by mouth daily. 02/20/19  Yes [provider]  atorvastatin (LIPITOR) 40 MG tablet Take 40 mg by mouth daily. 02/20/19  Yes [provider]  buPROPion (WELLBUTRIN SR) 150 MG 12 hr tablet Take 1 tablet (150 mg total) by mouth 2 (two) times daily. 01/19/19  Yes Dunn, Dayna N, PA-C  carvedilol (COREG) 3.125 MG tablet Take 1 tablet (3.125 mg total) by mouth 2 (two) times daily with a meal. 01/19/19  Yes Dunn, Dayna N, PA-C  folic acid (FOLVITE) 1 MG tablet Take 1 tablet (1 mg total) by mouth daily. 01/19/19  Yes Dunn, Dayna N, PA-C  furosemide (LASIX) 20 MG tablet Take 3 tablets (60 mg total) by mouth daily. Patient taking differently: Take 20 mg by mouth daily.  01/23/19  Yes Rehman, Areeg N, DO  losartan (COZAAR) 25 MG tablet Take 1 tablet (25 mg total) by mouth daily. 01/19/19  Yes Dunn, Dayna N, PA-C  multivitamin (RENA-VIT) TABS tablet Take 1 tablet by mouth at bedtime. 02/20/19  Yes [provider]  nitroGLYCERIN (NITROSTAT) 0.4 MG SL tablet Place 0.4 mg under the tongue every 5 (five) minutes as needed for chest pain.   Yes [provider]  pantoprazole (PROTONIX) 40 MG tablet Take 1 tablet (40 mg total) by mouth 2 (two) times daily. 01/22/19 03/19/19 Yes Rehman, Areeg N, DO  potassium chloride SA (K-DUR,KLOR-CON) 20 MEQ tablet Take 1 tablet (20 mEq total) by mouth daily. 01/19/19  Yes Dunn, Nedra Hai, PA-C  ticagrelor (BRILINTA) 90 MG TABS tablet Take 1 tablet (90 mg total) by mouth 2 (two) times daily. 01/19/19  Yes Dunn, Dayna N, PA-C  Multiple Vitamin (MULTIVITAMIN WITH MINERALS) TABS tablet Take 1 tablet by mouth daily. Patient not taking: Reported on 03/14/2019 07/17/18   Hosie Poisson, MD  spironolactone (ALDACTONE) 25 MG tablet Take 0.5 tablets (12.5 mg total) by mouth daily. Patient not taking: Reported on 03/14/2019 12/21/18   Charlie Pitter, PA-C    Family History Family History  Problem  Relation Age of Onset  . Alzheimer's disease Father     Social History Social History   Tobacco Use  . Smoking status: Current Every Day Smoker    Packs/day: 1.00    Types: Cigarettes  . Smokeless tobacco: Current User  Substance Use Topics  . Alcohol use: Yes    Comment: vague regarding current use  . Drug use: Yes    Types: Cocaine, Marijuana, Methamphetamines, Benzodiazepines    Comment: vaguely discusses useage, but quickly changes subject     Allergies   Patient has no known allergies.   Review of Systems Review of Systems  Constitutional: Positive for activity change.  Respiratory: Positive for shortness of breath.   Cardiovascular: Negative for chest pain.  Gastrointestinal: Negative for blood in stool, nausea and vomiting.  Allergic/Immunologic: Negative for immunocompromised state.  Hematological: Does not bruise/bleed easily.  All other systems reviewed and are negative.    Physical Exam Updated Vital Signs BP (!) 125/94   Pulse (!) 102   Temp 98 F (36.7 C) (Oral)   Resp 13   Ht 5\' 9"  (1.753 m)   Wt 77.1 kg   SpO2 98%   BMI 25.10 kg/m   Physical Exam Vitals signs and nursing note reviewed.  Constitutional:      Appearance: He is well-developed.  HENT:     Head: Atraumatic.  Neck:     Musculoskeletal: Neck supple.  Cardiovascular:     Rate and Rhythm: Normal rate.  Pulmonary:     Effort: Pulmonary effort is normal.  Musculoskeletal:     Right lower leg: No edema.     Left lower leg: No edema.  Skin:    General: Skin is warm.  Neurological:     Mental Status: He is alert and oriented to person, place, and time.      ED Treatments / Results  Labs (all labs ordered are listed, but only abnormal results are displayed) Labs Reviewed  CBC - Abnormal; Notable for the following components:      Result Value   Hemoglobin 10.3 (*)    HCT 35.5 (*)    MCV 74.0 (*)    MCH 21.5 (*)    MCHC 29.0 (*)    RDW 22.9 (*)    nRBC 0.3 (*)     All other components within normal limits  TROPONIN I - Abnormal; Notable for  the following components:   Troponin I 0.03 (*)    All other components within normal limits  BRAIN NATRIURETIC PEPTIDE - Abnormal; Notable for the following components:   B Natriuretic Peptide 753.8 (*)    All other components within normal limits  SARS CORONAVIRUS 2 (HOSPITAL ORDER, [place] [facility])  BASIC METABOLIC PANEL    EKG EKG Interpretation  Date/Time:  Monday Mar 14 2019 15:37:40 EDT Ventricular Rate:  97 PR Interval:  140 QRS Duration: 80 QT Interval:  360 QTC Calculation: 457 R Axis:   75 Text Interpretation:  Normal sinus rhythm Possible Left atrial enlargement Anteroseptal infarct , age undetermined Abnormal ECG No acute changes No significant change since last tracing Confirmed by Varney Biles ([id number]) on 03/14/2019 4:01:30 PM   Radiology Dg Chest 2 View  Result Date: 03/14/2019 CLINICAL DATA:  Shortness breath EXAM: CHEST - 2 VIEW COMPARISON:  January 19, 2019 FINDINGS: There is mild interstitial pulmonary edema. There is no airspace consolidation. There is mild cardiomegaly with mild pulmonary venous hypertension. No adenopathy. No bone lesions. Patient is status post median sternotomy. No bone lesions. IMPRESSION: There is a degree of pulmonary vascular congestion with mild interstitial edema. No consolidation. There is suspected degree of congestive heart failure. Electronically Signed   By: Lowella Grip III M.D.   On: 03/14/2019 15:57    Procedures Procedures (including critical care time)  Medications Ordered in ED Medications  sodium chloride flush (NS) 0.9 % injection 3 mL (3 mLs Intravenous Given 03/14/19 1900)  furosemide (LASIX) injection 60 mg (60 mg Intravenous Given 03/14/19 1859)     Initial Impression / Assessment and Plan / ED Course  I have reviewed the triage vital signs and the nursing notes.  Pertinent labs & imaging results that were  available during my care of the patient were reviewed by me and considered in my medical decision making (see chart for details).        53 year old male comes in a chief complaint of shortness of breath.  He has known history of advanced CHF and CAD.  He has no chest pains.  He also has history of severe anemia requiring transfusion, but he denies any evidence of melena.  Initial differential diagnosis includes CHF, ACS, symptomatic anemia. Hemoglobin is at 10.  It appears based on clinical history that patient is not taking Lasix as prescribed.  Chest x-ray does show some interstitial edema and BNP is elevated.  Patient is also having orthopnea and paroxysmal nocturnal dyspnea.  We will treat him for CHF exacerbation.  IV Lasix given in the ER.  Final Clinical Impressions(s) / ED Diagnoses   Final diagnoses:  Acute systolic congestive heart failure [facility])    ED Discharge Orders    None       Varney Biles, MD 03/14/19 1907

## 2019-03-14 NOTE — ED Notes (Signed)
Date and time results received: 03/14/19 4:53 PM   Test: troponin Critical Value: 0.03  Name of Provider Notified: Kathrynn Humble MD

## 2019-03-14 NOTE — ED Triage Notes (Addendum)
Pt reports he feels sob and weak- states this feels like when he had low blood counts- pt states he has had 2 MIs in past 6 months.

## 2019-03-14 NOTE — Plan of Care (Signed)
  Problem: Elimination: Goal: Will not experience complications related to urinary retention Outcome: Progressing   Problem: Safety: Goal: Ability to remain free from injury will improve Outcome: Progressing   Problem: Activity: Goal: Capacity to carry out activities will improve Outcome: Progressing   

## 2019-03-15 ENCOUNTER — Encounter (HOSPITAL_COMMUNITY): Payer: Self-pay | Admitting: General Practice

## 2019-03-15 DIAGNOSIS — I5023 Acute on chronic systolic (congestive) heart failure: Secondary | ICD-10-CM | POA: Diagnosis not present

## 2019-03-15 LAB — BASIC METABOLIC PANEL
Anion gap: 13 (ref 5–15)
BUN: 17 mg/dL (ref 6–20)
CO2: 26 mmol/L (ref 22–32)
Calcium: 9.3 mg/dL (ref 8.9–10.3)
Chloride: 102 mmol/L (ref 98–111)
Creatinine, Ser: 1.05 mg/dL (ref 0.61–1.24)
GFR calc Af Amer: >60 mL/min (ref >60)
GFR calc non Af Amer: >60 mL/min (ref >60)
Glucose, Bld: 92 mg/dL (ref 70–99)
Potassium: 3.6 mmol/L (ref 3.5–5.1)
Sodium: 141 mmol/L (ref 135–145)

## 2019-03-15 LAB — TROPONIN I
Troponin I: 0.03 ng/mL (ref <0.03)
Troponin I: 0.03 ng/mL

## 2019-03-15 MED ORDER — FUROSEMIDE 20 MG PO TABS: 60.0000 mg | ORAL_TABLET | Freq: Every day | ORAL | 0 refills | Status: DC

## 2019-03-15 NOTE — Discharge Summary (Signed)
Physician Discharge Summary  Howard Thompson VPX:[id number] DOB: 1966-03-06 DOA: 03/14/2019  PCP: Jamesetta Geralds, MD  Admit date: 03/14/2019 Discharge date: 03/15/2019  Admitted From: home Discharge disposition: home   Recommendations for Outpatient Follow-Up:   1. Needs repeat echo 2. BMP 1 week 3. Daily weights   Discharge Diagnosis:   Principal Problem:   Acute on chronic systolic (congestive) heart failure (HCC) Active Problems:   Iron deficiency anemia   CAD in native artery   Alcohol abuse   Essential hypertension    Discharge Condition: Improved.  Diet recommendation: Low sodium, heart healthy.    Wound care: None.  Code status: Full.   History of Present Illness:   Howard Thompson is a 53 y.o. male with medical history significant for polysubstance abuse, coronary artery disease, upper GI bleeding, hypertension, and chronic systolic CHF, now presenting to emergency department for evaluation of shortness of breath and generalized weakness/fatigue.  Patient reports that he been in his usual state of health until approximately 4 days ago when he noted the insidious development of shortness of breath and generalized weakness.  Since that time, he has become progressively weak in general and dyspneic with the slightest exertion.  He also reports waking several times a night gasping for air.  He denies any fevers, chills, or chest pain.  He has had a mild nonproductive cough.  Denies any lower extremity swelling or tenderness.  He is prescribed 60 mg of Lasix daily, but due to some confusion has only been taking 20 mg daily.   Hospital Course by Problem:   Acute on chronic systolic CHF  - Presents with several days of SOB, gen weakness, and PND  - Found to have elevated BNP, JVD, and CXR-findings concerning for acute CHF  - Echo from February 2020 demonstrated EF 20-25%  - He had been taking 20 mg Lasix daily instead of the prescribed 60 mg qD  - Treated  with Lasix 60 mg IV in ED and has swift diuresis overnight - feels as if he is back to his baseline -needs close outpatient follow up  CAD - No anginal complaints, troponin slightly elevated in setting of acute CHF with respiratory distress  - Continue statin, beta-blocker, and Brilinta   Iron-deficiency anemia  - Hgb is improved to 10.3 and there is no active bleeding   -outpatient follow up  Drug and alcohol abuse  -encouraged cessation     Medical Consultants:      Discharge Exam:   Vitals:   03/15/19 0014 03/15/19 0524  BP: 99/65 (!) 93/56  Pulse: 89 86  Resp: 16 16  Temp: 98.4 F (36.9 C) (!) 97.5 F (36.4 C)  SpO2: 98% 99%   Vitals:   03/14/19 2059 03/15/19 0014 03/15/19 0524 03/15/19 0526  BP:  99/65 (!) 93/56   Pulse:  89 86   Resp:  16 16   Temp:  98.4 F (36.9 C) (!) 97.5 F (36.4 C)   TempSrc:  Oral Oral   SpO2:  98% 99%   Weight: 73.5 kg   72.7 kg  Height:        General exam: Appears calm and comfortable. Able to lay flat   The results of significant diagnostics from this hospitalization (including imaging, microbiology, ancillary and laboratory) are listed below for reference.     Procedures and Diagnostic Studies:   Dg Chest 2 View  Result Date: 03/14/2019 CLINICAL DATA:  Shortness breath EXAM: CHEST - 2 VIEW  COMPARISON:  January 19, 2019 FINDINGS: There is mild interstitial pulmonary edema. There is no airspace consolidation. There is mild cardiomegaly with mild pulmonary venous hypertension. No adenopathy. No bone lesions. Patient is status post median sternotomy. No bone lesions. IMPRESSION: There is a degree of pulmonary vascular congestion with mild interstitial edema. No consolidation. There is suspected degree of congestive heart failure. Electronically Signed   By: Lowella Grip III M.D.   On: 03/14/2019 15:57     Labs:   Basic Metabolic Panel: Recent Labs  Lab 03/14/19 1534 03/15/19 0534  NA 141 141  K 4.7 3.6  CL  106 102  CO2 24 26  GLUCOSE 96 92  BUN 16 17  CREATININE 0.96 1.05  CALCIUM 9.7 9.3   GFR Estimated Creatinine Clearance: 82.3 mL/min (by C-G formula based on SCr of 1.05 mg/dL). Liver Function Tests: No results for input(s): AST, ALT, ALKPHOS, BILITOT, PROT, ALBUMIN in the last 168 hours. No results for input(s): LIPASE, AMYLASE in the last 168 hours. No results for input(s): AMMONIA in the last 168 hours. Coagulation profile No results for input(s): INR, PROTIME in the last 168 hours.  CBC: Recent Labs  Lab 03/14/19 1534  WBC 7.3  HGB 10.3*  HCT 35.5*  MCV 74.0*  PLT 389   Cardiac Enzymes: Recent Labs  Lab 03/14/19 1534 03/14/19 2355 03/15/19 0534  TROPONINI 0.03* 0.03* 0.03*   BNP: Invalid input(s): POCBNP CBG: No results for input(s): GLUCAP in the last 168 hours. D-Dimer No results for input(s): DDIMER in the last 72 hours. Hgb A1c No results for input(s): HGBA1C in the last 72 hours. Lipid Profile No results for input(s): CHOL, HDL, LDLCALC, TRIG, CHOLHDL, LDLDIRECT in the last 72 hours. Thyroid function studies No results for input(s): TSH, T4TOTAL, T3FREE, THYROIDAB in the last 72 hours.  Invalid input(s): FREET3 Anemia work up No results for input(s): VITAMINB12, FOLATE, FERRITIN, TIBC, IRON, RETICCTPCT in the last 72 hours. Microbiology Recent Results (from the past 240 hour(s))  SARS Coronavirus 2 (CEPHEID - Performed in [place] hospital lab), Hosp Order     Status: None   Collection Time: 03/14/19  4:59 PM  Result Value Ref Range Status   SARS Coronavirus 2 NEGATIVE NEGATIVE Final    Comment: (NOTE) If result is NEGATIVE SARS-CoV-2 target nucleic acids are NOT DETECTED. The SARS-CoV-2 RNA is generally detectable in upper and lower  respiratory specimens during the acute phase of infection. The lowest  concentration of SARS-CoV-2 viral copies this assay can detect is 250  copies / mL. A negative result does not preclude SARS-CoV-2 infection   and should not be used as the sole basis for treatment or other  patient management decisions.  A negative result may occur with  improper specimen collection / handling, submission of specimen other  than nasopharyngeal swab, presence of viral mutation(s) within the  areas targeted by this assay, and inadequate number of viral copies  (<250 copies / mL). A negative result must be combined with clinical  observations, patient history, and epidemiological information. If result is POSITIVE SARS-CoV-2 target nucleic acids are DETECTED. The SARS-CoV-2 RNA is generally detectable in upper and lower  respiratory specimens dur ing the acute phase of infection.  Positive  results are indicative of active infection with SARS-CoV-2.  Clinical  correlation with patient history and other diagnostic information is  necessary to determine patient infection status.  Positive results do  not rule out bacterial infection or co-infection with other viruses. If  result is PRESUMPTIVE POSTIVE SARS-CoV-2 nucleic acids MAY BE PRESENT.   A presumptive positive result was obtained on the submitted specimen  and confirmed on repeat testing.  While 2019 novel coronavirus  (SARS-CoV-2) nucleic acids may be present in the submitted sample  additional confirmatory testing may be necessary for epidemiological  and / or clinical management purposes  to differentiate between  SARS-CoV-2 and other Sarbecovirus currently known to infect humans.  If clinically indicated additional testing with an alternate test  methodology [phone]) is advised. The SARS-CoV-2 RNA is generally  detectable in upper and lower respiratory sp ecimens during the acute  phase of infection. The expected result is Negative. Fact Sheet for Patients:  StrictlyIdeas.no Fact Sheet for Healthcare Providers: BankingDealers.co.za This test is not yet approved or cleared by the [place] FDA and  has been authorized for detection and/or diagnosis of SARS-CoV-2 by FDA under an Emergency Use Authorization (EUA).  This EUA will remain in effect (meaning this test can be used) for the duration of the COVID-19 declaration under Section 564(b)(1) of the Act, 21 U.S.C. section 360bbb-3(b)(1), unless the authorization is terminated or revoked sooner. Performed at [facility], [place] [address] [place], [place] [address]      Discharge Instructions:   Discharge Instructions    (HEART FAILURE PATIENTS) Call MD:  Anytime you have any of the following symptoms: 1) 3 pound weight gain in 24 hours or 5 pounds in 1 week 2) shortness of breath, with or without a dry hacking cough 3) swelling in the hands, feet or stomach 4) if you have to sleep on extra pillows at night in order to breathe.   Complete by:  As directed    Diet - low sodium heart healthy   Complete by:  As directed    Increase activity slowly   Complete by:  As directed      Allergies as of 03/15/2019   No Known Allergies     Medication List    STOP taking these medications   amLODipine 5 MG tablet Commonly known as:  NORVASC     TAKE these medications   atorvastatin 40 MG tablet Commonly known as:  LIPITOR Take 40 mg by mouth daily.   buPROPion 150 MG 12 hr tablet Commonly known as:  WELLBUTRIN SR Take 1 tablet (150 mg total) by mouth 2 (two) times daily.   carvedilol 3.125 MG tablet Commonly known as:  COREG Take 1 tablet (3.125 mg total) by mouth 2 (two) times daily with a meal.   folic acid 1 MG tablet Commonly known as:  FOLVITE Take 1 tablet (1 mg total) by mouth daily.   furosemide 20 MG tablet Commonly known as:  LASIX Take 3 tablets (60 mg total) by mouth daily. What changed:  how much to take   losartan 25 MG tablet Commonly known as:  COZAAR Take 1 tablet (25 mg total) by mouth daily.   multivitamin Tabs tablet Take 1 tablet by mouth at bedtime.   multivitamin with minerals Tabs  tablet Take 1 tablet by mouth daily.   nitroGLYCERIN 0.4 MG SL tablet Commonly known as:  NITROSTAT Place 0.4 mg under the tongue every 5 (five) minutes as needed for chest pain.   pantoprazole 40 MG tablet Commonly known as:  PROTONIX Take 1 tablet (40 mg total) by mouth 2 (two) times daily.   potassium chloride SA 20 MEQ tablet Commonly known as:  K-DUR Take 1 tablet (20 mEq total) by  mouth daily.   spironolactone 25 MG tablet Commonly known as:  ALDACTONE Take 0.5 tablets (12.5 mg total) by mouth daily.   ticagrelor 90 MG Tabs tablet Commonly known as:  BRILINTA Take 1 tablet (90 mg total) by mouth 2 (two) times daily.      Follow-up Information    Radiontchenko, Alexei, MD Follow up in 1 week(s).   Specialty:  Family Medicine Contact information: [address] [place] [address] [phone]        Troy Sine, MD Follow up.   Specialty:  Cardiology Why:  need a repeat echo regarding your EF-- last echo showed low EF Contact information: [address] [place] [address] [phone]            Time coordinating discharge: 25 min  Signed:  Geradine Girt DO  [facility] 03/15/2019, 10:20 AM

## 2019-03-15 NOTE — TOC Initial Note (Signed)
Transition of Care [facility]) - Initial/Assessment Note    Patient Details  Name: Howard Thompson MRN: [id number] Date of Birth: 1966/04/17  [facility] CM/SW Contact:    Sherrilyn Rist Phone Number: [phone] 03/15/2019, 9:01 AM  Clinical Narrative:                 03/15/2019 - Patient known to CM from previous admission. CM following for progression of care. Mindi Slicker RN,MHA,BSN  01/21/2019 - Clinical Narrative:                 Patient lives at home alone; Radiontchenko, [place] Sink, MD; has private insurance with [facility] with prescription drug coverage; [facility] [address]; he is independent, continues to smoke but states that he plans to stop; no DME; CM will continue to follow for progression of care; B Neville Walston RN,MHA,BSN  Expected Discharge Plan: Home/Self Care Barriers to Discharge: No Barriers Identified   Patient Goals and CMS Choice     Choice offered to / list presented to : NA  Expected Discharge Plan and Services Expected Discharge Plan: Home/Self Care In-house Referral: NA Discharge Planning Services: CM Consult Post Acute Care Choice: NA Living arrangements for the past 2 months: Single Family Home                 DME Arranged: N/A DME Agency: NA       HH Arranged: NA [place] Agency: NA        Prior Living Arrangements/Services Living arrangements for the past 2 months: Single Family Home Lives with:: Self          Need for Family Participation in Patient Care: No (Comment) Care giver support system in place?: Yes (comment)      Activities of Daily Living Home Assistive Devices/Equipment: Eyeglasses ADL Screening (condition at time of admission) Patient's cognitive ability adequate to safely complete daily activities?: Yes Is the patient deaf or have difficulty hearing?: No Does the patient have difficulty seeing, even when wearing glasses/contacts?: No Does the patient have difficulty concentrating,  remembering, or making decisions?: No Patient able to express need for assistance with ADLs?: Yes Does the patient have difficulty dressing or bathing?: No Independently performs ADLs?: Yes (appropriate for developmental age) Does the patient have difficulty walking or climbing stairs?: No Weakness of Legs: None Weakness of Arms/Hands: None  Permission Sought/Granted                  Emotional Assessment         Alcohol / Substance Use: Alcohol Use Psych Involvement: No (comment)  Admission diagnosis:  Acute systolic congestive heart failure ([place]) [I50.21] Patient Active Problem List   Diagnosis Date Noted  . Acute on chronic systolic (congestive) heart failure ([place]) 03/14/2019  . Alcohol abuse 03/14/2019  . Essential hypertension 03/14/2019  . Acute combined systolic and diastolic heart failure ([place])   . AVM (arteriovenous malformation) of small bowel, acquired with hemorrhage   . GI bleeding 01/19/2019  . Iron deficiency anemia 12/20/2018  . CAD in native artery 12/20/2018  . Acute blood loss anemia   . Antiplatelet or antithrombotic long-term use   . Anemia due to blood loss 07/14/2018  . Protein-calorie malnutrition, severe 06/11/2018  . Anemia due to chronic blood loss   . Hyperlipidemia LDL goal <70   . Ischemic cardiomyopathy    PCP:  Jamesetta Geralds, MD Pharmacy:   [id number] [place], [address] [place] [address] Phone: [phone] Fax: [phone]  [facility] [place] [place] - [address] [place] [address] Phone: [phone] Fax: [phone]     Social Determinants of Health (SDOH) Interventions    Readmission Risk Interventions Readmission Risk Prevention Plan 01/21/2019  Transportation Screening Complete  Home Care Screening Complete

## 2019-03-15 NOTE — Progress Notes (Signed)
Patient being discharged home.  Patient to be transported by family.  IV removed with the catheter intact.  Discharge instructions and prescription information given to the patient who verbalized understanding.   

## 2019-03-15 NOTE — Plan of Care (Signed)
Progressing toward goals. 

## 2019-03-19 ENCOUNTER — Other Ambulatory Visit: Payer: Self-pay | Admitting: Physician Assistant

## 2019-03-23 ENCOUNTER — Telehealth: Payer: Self-pay | Admitting: Physician Assistant

## 2019-03-23 NOTE — Telephone Encounter (Signed)
OK to refill but patient needs f/u OV. Has appt 5/29 with Almyra Deforest. Dayna Dunn PA-C

## 2019-03-24 IMAGING — MR MR HEAD W/O CM
4 series · 48 of 48 positions shown · non-contrast
Comparison: 05/22/2018 CT head

CLINICAL DATA: 52 y/o M; found down, unwitnessed arrest with CPR,
status post hypothermia protocol. Patient with metabolic disarray,
acid base imbalance, hypovolemia, and new onset delirium with
incomprehensible speech 3 days ago.

EXAM:
MRI HEAD WITHOUT CONTRAST
TECHNIQUE: Axial and coronal diffusion weighted sequences were acquired. The
patient was unable to remain still and additional sequences were not
acquired.

[Series 5: DWI · axial · 4.0mm · 0.92mm/px · z∈[-25,+119]mm · 17 of 74 slices shown (1 of 4)]
[im 1/74]
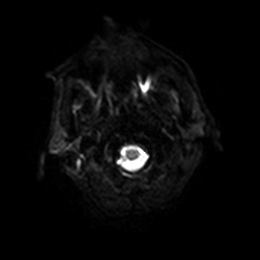
[im 5/74]
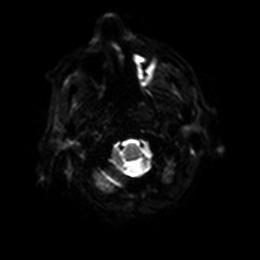
[im 10/74]
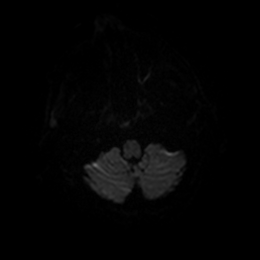
[im 14/74]
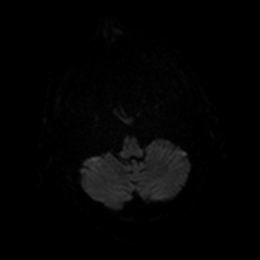
[im 19/74]
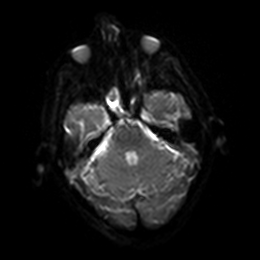
[im 23/74]
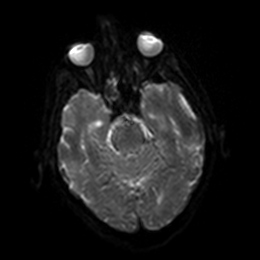
[im 28/74]
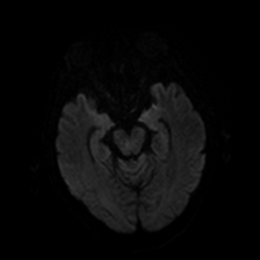
[im 32/74]
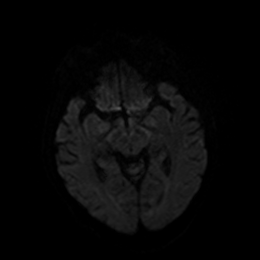
[im 37/74]
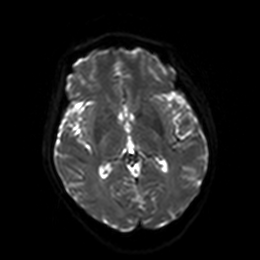
[im 42/74]
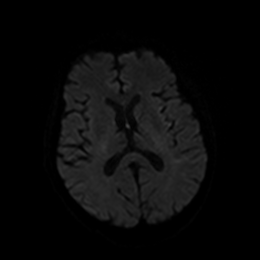
[im 46/74]
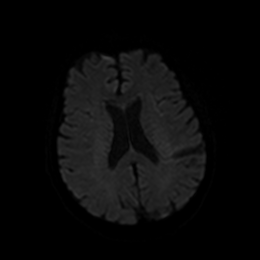
[im 51/74]
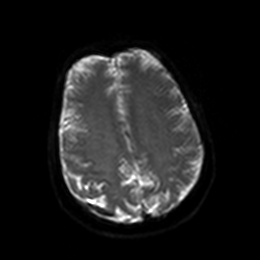
[im 55/74]
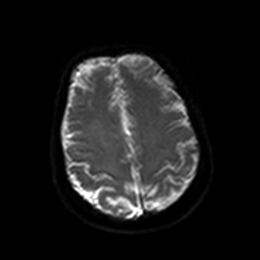
[im 60/74]
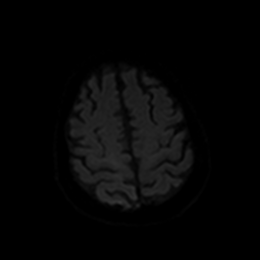
[im 64/74]
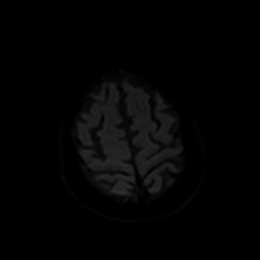
[im 69/74]
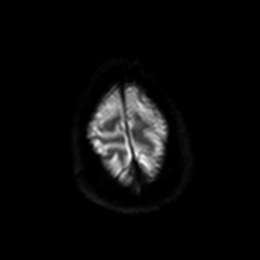
[im 74/74]
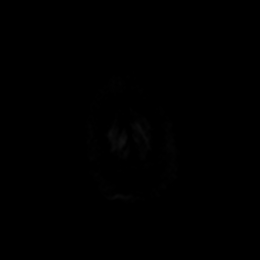

[Series 6: DWI · axial · 4.0mm · 0.92mm/px · z∈[-25,+119]mm · 8 of 37 slices shown (2 of 4)]
[im 1/37]
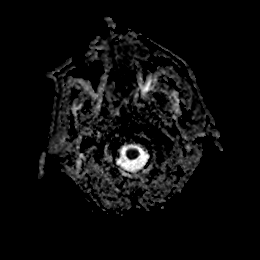
[im 6/37]
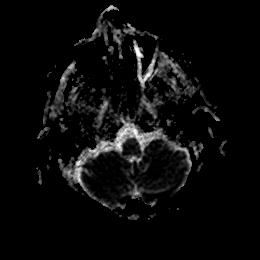
[im 11/37]
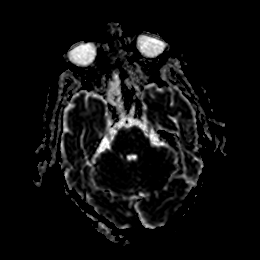
[im 16/37]
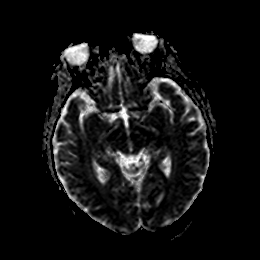
[im 21/37]
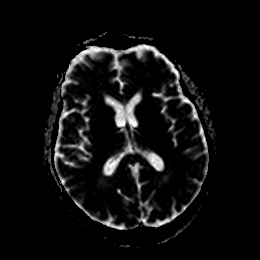
[im 26/37]
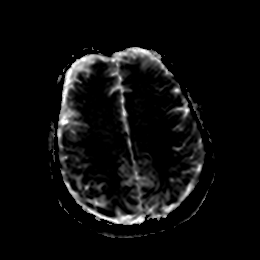
[im 31/37]
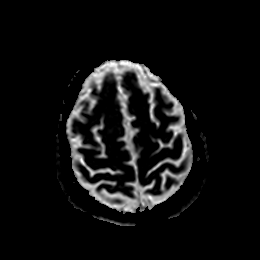
[im 37/37]
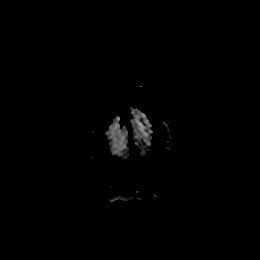

[Series 7: DWI · coronal · 4.0mm · 0.88mm/px · 15 of 68 slices shown (3 of 4)]
[im 1/68]
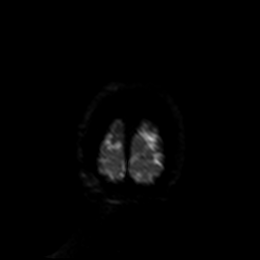
[im 5/68]
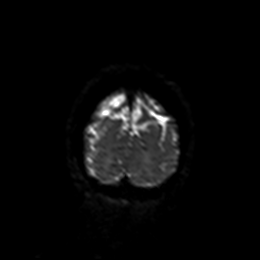
[im 10/68]
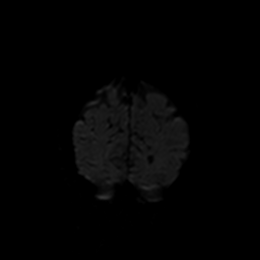
[im 15/68]
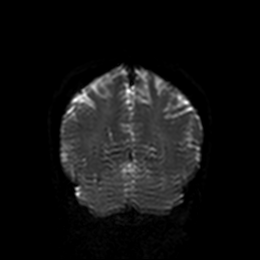
[im 20/68]
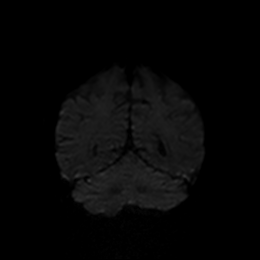
[im 24/68]
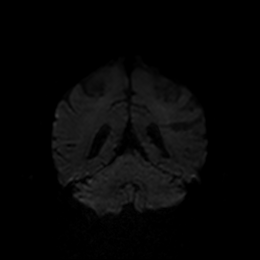
[im 29/68]
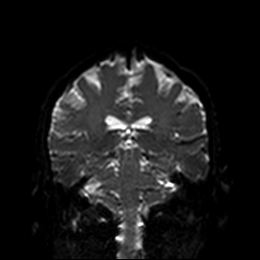
[im 34/68]
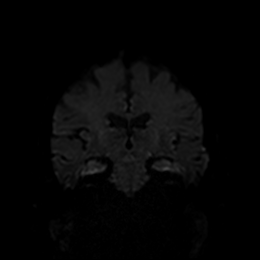
[im 39/68]
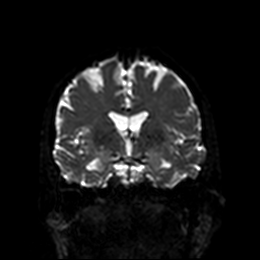
[im 44/68]
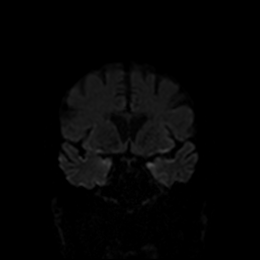
[im 48/68]
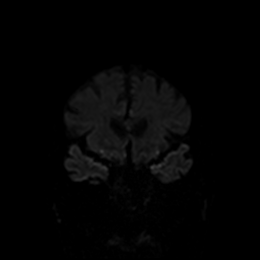
[im 53/68]
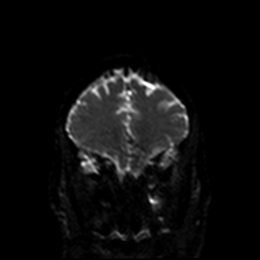
[im 58/68]
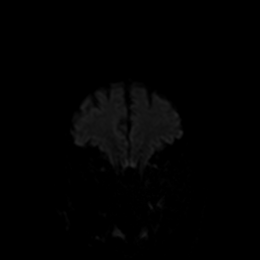
[im 63/68]
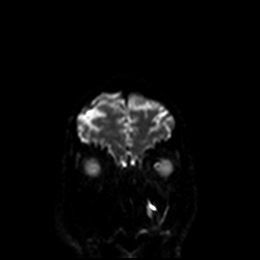
[im 68/68]
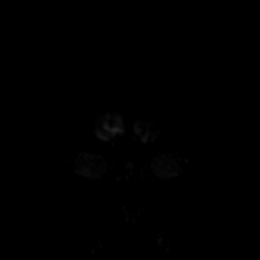

[Series 8: DWI · coronal · 4.0mm · 0.88mm/px · 8 of 34 slices shown (4 of 4)]
[im 1/34]
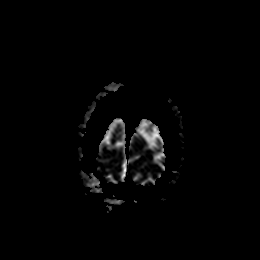
[im 5/34]
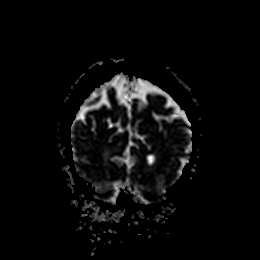
[im 10/34]
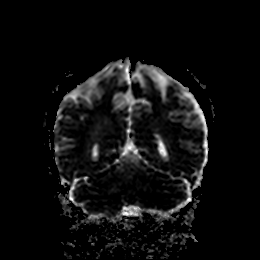
[im 15/34]
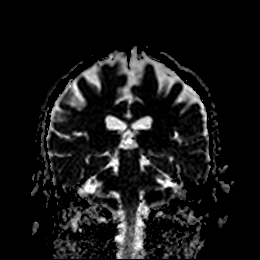
[im 19/34]
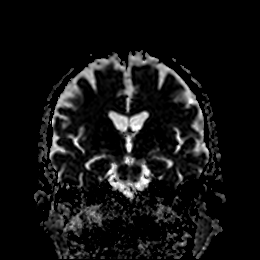
[im 24/34]
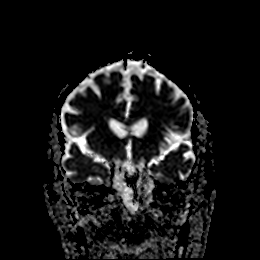
[im 29/34]
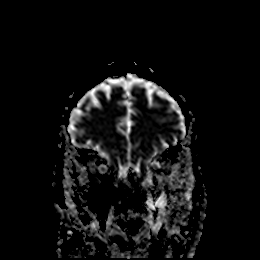
[im 34/34]
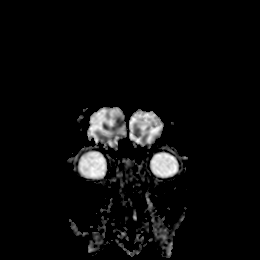

[48 of 48 positions shown; findings below may reference images not displayed]

FINDINGS: Motion degraded axial and coronal diffusion weighted sequences. No
gross diffusion abnormality to suggest acute or early subacute
ischemia. On the low B value sequence there are no findings of mass
effect, hydrocephalus, or herniation.
IMPRESSION: Motion degraded diffusion-weighted sequences. No gross diffusion
abnormality to suggest acute or early subacute ischemia. No mass
effect, hydrocephalus, or herniation. Repeat MRI of the brain is
recommended when patient is able to remain still.

By: Sifelani Tole M.D.

## 2019-03-24 IMAGING — DX DG CHEST 1V PORT
1 series · 1 of 1 positions shown · non-contrast
Comparison: 06/04/2018 and prior radiographs

CLINICAL DATA: Respiratory failure and shortness of breath.

EXAM:
PORTABLE CHEST 1 VIEW

[chest]
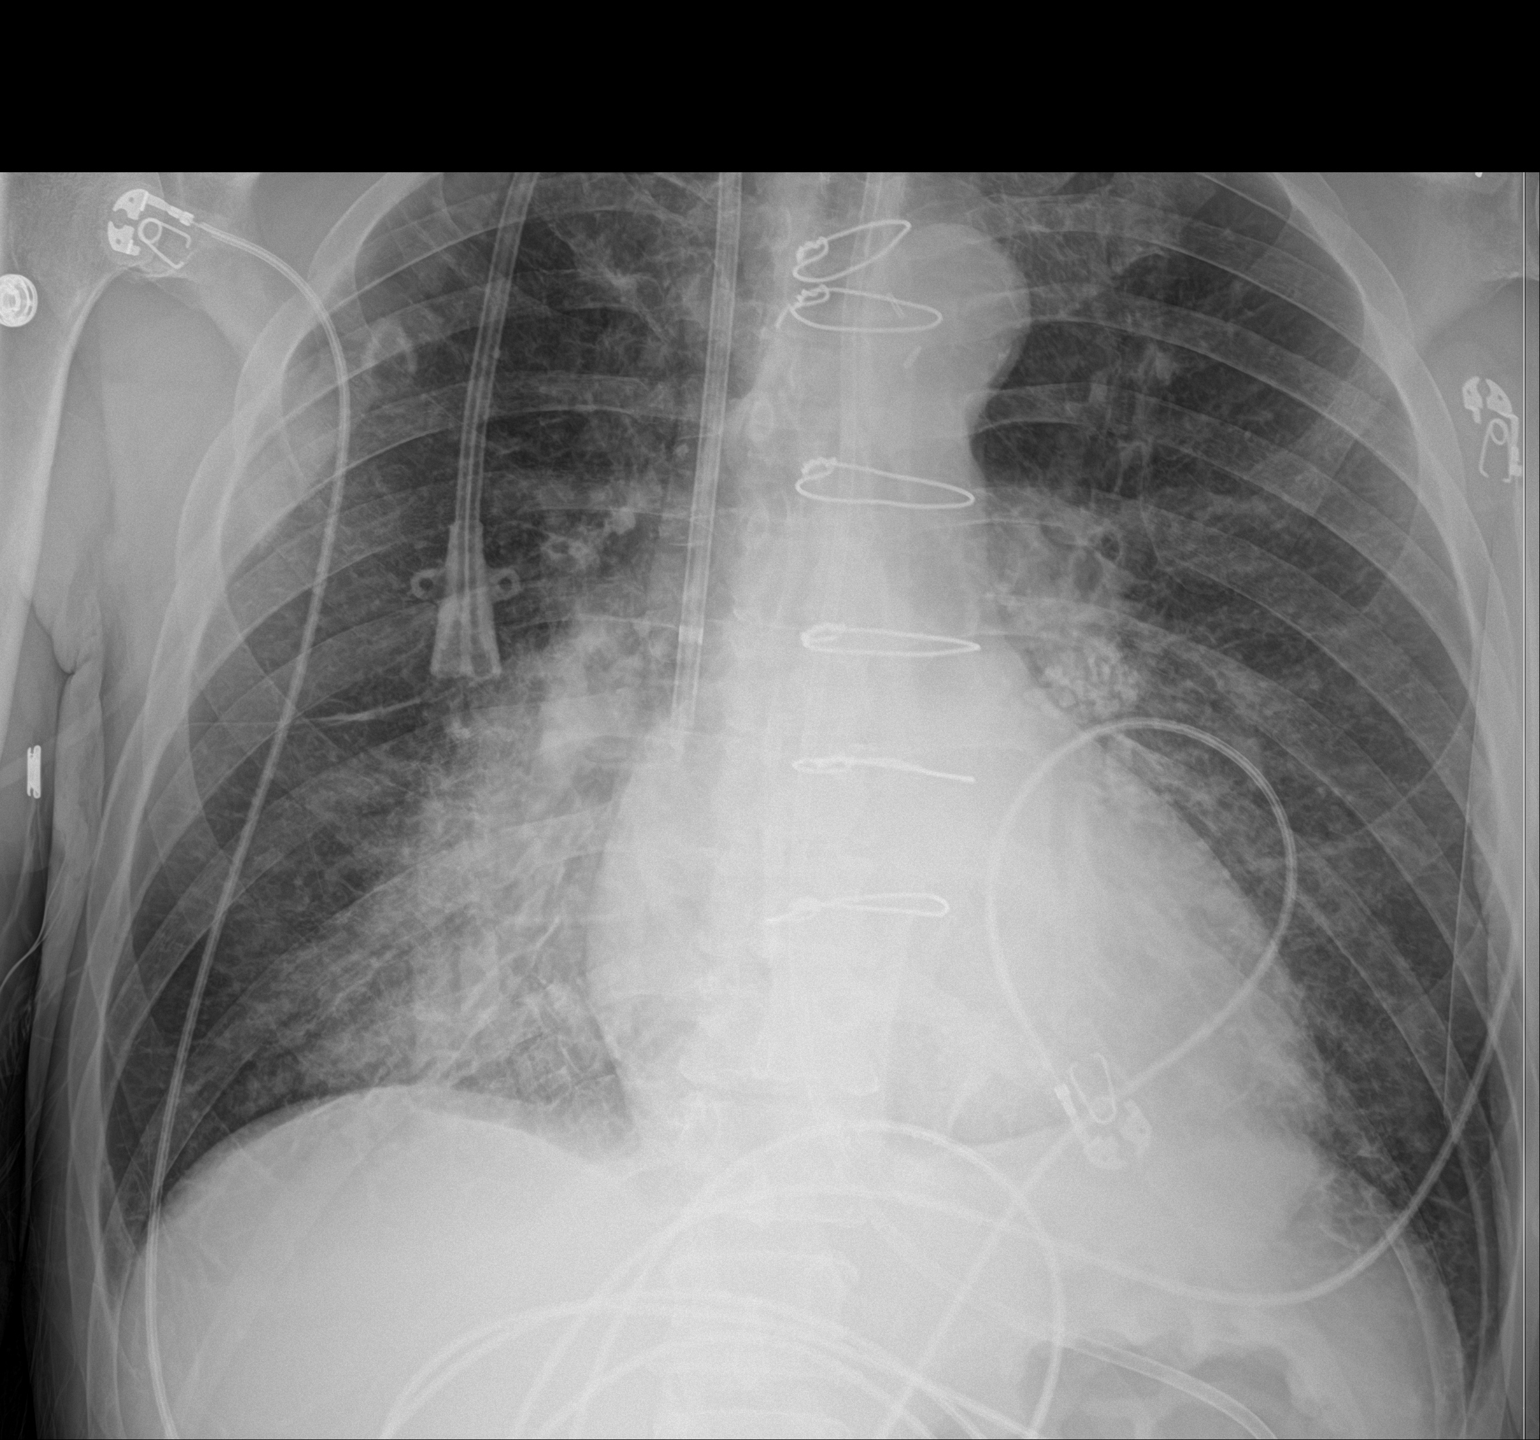

[1 of 1 positions shown; findings below may reference images not displayed]

FINDINGS: Cardiomegaly, small bore feeding tube entering the stomach with tip
off the field of view and RIGHT central venous catheter with tip
overlying the SUPERIOR cavoatrial junction noted.

Bilateral LOWER lung opacities/airspace disease again noted,
slightly improved on the LEFT.

No pneumothorax or large pleural effusion identified.
IMPRESSION: Bilateral LOWER lung opacities/airspace disease, slightly improved
on the LEFT.

Feeding tube placement.

No other significant change.

## 2019-03-25 ENCOUNTER — Telehealth: Payer: 59 | Admitting: Physician Assistant

## 2019-03-25 ENCOUNTER — Telehealth: Payer: Self-pay

## 2019-03-25 NOTE — Telephone Encounter (Signed)
Called Patient x3 for virtual visit, Pt did not answer left multiple of VM's

## 2019-03-25 NOTE — Telephone Encounter (Signed)
I see it was routed back to me but I already replied. Is there another question? Thanks

## 2019-03-25 NOTE — Telephone Encounter (Signed)
Left a detailed message for the patient and let him know that someone will be contacting him to reschedule his appointment.

## 2019-03-25 NOTE — Telephone Encounter (Signed)
Left a detailed message for the patient about trying to reach him about his virtual visit that is schedule today (03/25/2019) at 10AM with Almyra Deforest, PA-C and that I will be trying to call him again.

## 2019-04-15 ENCOUNTER — Other Ambulatory Visit: Payer: Self-pay | Admitting: Physician Assistant

## 2019-04-18 ENCOUNTER — Other Ambulatory Visit: Payer: Self-pay | Admitting: Physician Assistant

## 2019-05-02 ENCOUNTER — Other Ambulatory Visit: Payer: Self-pay | Admitting: Cardiovascular Disease

## 2019-05-02 NOTE — Telephone Encounter (Signed)
Rx refused. Patient hasn't been seen by our cardiologists since hospital stay in May 2020. He usually sees Dr Valaria Good at [facility]. Refills deferred.

## 2019-05-03 ENCOUNTER — Other Ambulatory Visit: Payer: Self-pay | Admitting: Cardiovascular Disease

## 2019-05-03 NOTE — Telephone Encounter (Signed)
Received request for 90-day supply of requested medications. Unable to fill at this time because patient hasn't been seen in the clinic by our providers.  Patient has followed up with Dr Donley Redder at [facility]. Patient "no-showed" for hosp f/u in May.   lmtcb to discuss matter and whether he'd be pursuing care with [place] or continuing to see the cardiologist he is already established with.

## 2019-05-28 ENCOUNTER — Other Ambulatory Visit: Payer: Self-pay | Admitting: Physician Assistant

## 2019-06-21 ENCOUNTER — Other Ambulatory Visit: Payer: Self-pay | Admitting: Cardiovascular Disease

## 2019-06-22 MED ORDER — TICAGRELOR 90 MG PO TABS: ORAL_TABLET | ORAL | 0 refills | Status: DC

## 2019-06-22 NOTE — Telephone Encounter (Signed)
Please advise if OK to refill. Patient "no showed" visit on 03/25/2019 and has not rescheduled.

## 2019-06-23 NOTE — Telephone Encounter (Signed)
Lm to call back and make an appt ./cy

## 2019-07-17 ENCOUNTER — Encounter: Payer: Self-pay | Admitting: Internal Medicine

## 2019-07-21 ENCOUNTER — Other Ambulatory Visit: Payer: Self-pay | Admitting: Cardiovascular Disease

## 2019-08-20 ENCOUNTER — Other Ambulatory Visit: Payer: Self-pay | Admitting: Physician Assistant

## 2019-08-22 ENCOUNTER — Other Ambulatory Visit: Payer: Self-pay | Admitting: Physician Assistant

## 2019-08-25 ENCOUNTER — Other Ambulatory Visit: Payer: Self-pay | Admitting: Physician Assistant

## 2019-08-26 ENCOUNTER — Other Ambulatory Visit: Payer: Self-pay | Admitting: Physician Assistant

## 2019-08-26 ENCOUNTER — Other Ambulatory Visit: Payer: Self-pay | Admitting: Cardiovascular Disease

## 2019-09-20 ENCOUNTER — Other Ambulatory Visit: Payer: Self-pay | Admitting: Physician Assistant

## 2019-09-20 ENCOUNTER — Other Ambulatory Visit: Payer: Self-pay | Admitting: Cardiovascular Disease

## 2019-09-20 NOTE — Telephone Encounter (Signed)
Rx request sent to pharmacy.  

## 2019-10-04 ENCOUNTER — Other Ambulatory Visit: Payer: Self-pay | Admitting: Physician Assistant

## 2019-10-15 ENCOUNTER — Other Ambulatory Visit: Payer: Self-pay | Admitting: Physician Assistant

## 2019-11-19 ENCOUNTER — Other Ambulatory Visit: Payer: Self-pay | Admitting: Physician Assistant

## 2019-11-29 ENCOUNTER — Other Ambulatory Visit: Payer: Self-pay | Admitting: Cardiovascular Disease

## 2019-12-06 ENCOUNTER — Other Ambulatory Visit: Payer: Self-pay | Admitting: Physician Assistant

## 2019-12-16 ENCOUNTER — Other Ambulatory Visit: Payer: Self-pay | Admitting: Cardiovascular Disease

## 2020-01-14 ENCOUNTER — Other Ambulatory Visit: Payer: Self-pay | Admitting: Physician Assistant

## 2020-02-09 ENCOUNTER — Other Ambulatory Visit: Payer: Self-pay | Admitting: Physician Assistant

## 2020-02-17 ENCOUNTER — Other Ambulatory Visit: Payer: Self-pay | Admitting: Cardiovascular Disease

## 2020-02-17 NOTE — Telephone Encounter (Signed)
Refill refused - has been seen with another cardiologist outside [facility] per chart review

## 2020-02-26 ENCOUNTER — Other Ambulatory Visit: Payer: Self-pay | Admitting: Cardiovascular Disease

## 2020-02-27 NOTE — Telephone Encounter (Signed)
Rx request sent to pharmacy.  

## 2020-03-27 ENCOUNTER — Other Ambulatory Visit: Payer: Self-pay | Admitting: Physician Assistant

## 2020-04-16 ENCOUNTER — Other Ambulatory Visit: Payer: Self-pay | Admitting: Cardiovascular Disease

## 2020-05-13 ENCOUNTER — Other Ambulatory Visit: Payer: Self-pay | Admitting: Cardiovascular Disease

## 2020-05-22 ENCOUNTER — Other Ambulatory Visit: Payer: Self-pay | Admitting: Cardiovascular Disease

## 2020-08-23 ENCOUNTER — Encounter (HOSPITAL_COMMUNITY): Payer: Self-pay | Admitting: Emergency Medicine

## 2020-08-23 ENCOUNTER — Emergency Department (HOSPITAL_COMMUNITY): Payer: No Typology Code available for payment source

## 2020-08-23 ENCOUNTER — Inpatient Hospital Stay (HOSPITAL_COMMUNITY)
Admission: EM | Admit: 2020-08-23 | Discharge: 2020-08-25 | DRG: 291 | Disposition: A | Discharge status: Home / Self Care | Payer: No Typology Code available for payment source | Attending: Internal Medicine | Admitting: Internal Medicine

## 2020-08-23 ENCOUNTER — Other Ambulatory Visit: Payer: Self-pay

## 2020-08-23 DIAGNOSIS — E785 Hyperlipidemia, unspecified: Secondary | ICD-10-CM | POA: Diagnosis present

## 2020-08-23 DIAGNOSIS — I252 Old myocardial infarction: Secondary | ICD-10-CM | POA: Diagnosis not present

## 2020-08-23 DIAGNOSIS — I340 Nonrheumatic mitral (valve) insufficiency: Secondary | ICD-10-CM | POA: Diagnosis present

## 2020-08-23 DIAGNOSIS — I255 Ischemic cardiomyopathy: Secondary | ICD-10-CM | POA: Diagnosis present

## 2020-08-23 DIAGNOSIS — T45526A Underdosing of antithrombotic drugs, initial encounter: Secondary | ICD-10-CM | POA: Diagnosis present

## 2020-08-23 DIAGNOSIS — I509 Heart failure, unspecified: Secondary | ICD-10-CM | POA: Diagnosis not present

## 2020-08-23 DIAGNOSIS — Z91120 Patient's intentional underdosing of medication regimen due to financial hardship: Secondary | ICD-10-CM

## 2020-08-23 DIAGNOSIS — R0603 Acute respiratory distress: Secondary | ICD-10-CM | POA: Diagnosis present

## 2020-08-23 DIAGNOSIS — D649 Anemia, unspecified: Secondary | ICD-10-CM

## 2020-08-23 DIAGNOSIS — Z79899 Other long term (current) drug therapy: Secondary | ICD-10-CM | POA: Diagnosis not present

## 2020-08-23 DIAGNOSIS — Z20822 Contact with and (suspected) exposure to covid-19: Secondary | ICD-10-CM | POA: Diagnosis present

## 2020-08-23 DIAGNOSIS — N179 Acute kidney failure, unspecified: Secondary | ICD-10-CM | POA: Diagnosis present

## 2020-08-23 DIAGNOSIS — K5521 Angiodysplasia of colon with hemorrhage: Secondary | ICD-10-CM | POA: Diagnosis present

## 2020-08-23 DIAGNOSIS — Z820 Family history of epilepsy and other diseases of the nervous system: Secondary | ICD-10-CM | POA: Diagnosis not present

## 2020-08-23 DIAGNOSIS — D509 Iron deficiency anemia, unspecified: Secondary | ICD-10-CM | POA: Diagnosis present

## 2020-08-23 DIAGNOSIS — R7989 Other specified abnormal findings of blood chemistry: Secondary | ICD-10-CM | POA: Diagnosis not present

## 2020-08-23 DIAGNOSIS — J449 Chronic obstructive pulmonary disease, unspecified: Secondary | ICD-10-CM | POA: Diagnosis present

## 2020-08-23 DIAGNOSIS — I2510 Atherosclerotic heart disease of native coronary artery without angina pectoris: Secondary | ICD-10-CM | POA: Diagnosis present

## 2020-08-23 DIAGNOSIS — R945 Abnormal results of liver function studies: Secondary | ICD-10-CM

## 2020-08-23 DIAGNOSIS — G7000 Myasthenia gravis without (acute) exacerbation: Secondary | ICD-10-CM | POA: Diagnosis present

## 2020-08-23 DIAGNOSIS — I5043 Acute on chronic combined systolic (congestive) and diastolic (congestive) heart failure: Secondary | ICD-10-CM | POA: Diagnosis present

## 2020-08-23 DIAGNOSIS — F17210 Nicotine dependence, cigarettes, uncomplicated: Secondary | ICD-10-CM | POA: Diagnosis present

## 2020-08-23 DIAGNOSIS — I110 Hypertensive heart disease with heart failure: Secondary | ICD-10-CM | POA: Diagnosis not present

## 2020-08-23 DIAGNOSIS — G4733 Obstructive sleep apnea (adult) (pediatric): Secondary | ICD-10-CM | POA: Diagnosis present

## 2020-08-23 DIAGNOSIS — Z955 Presence of coronary angioplasty implant and graft: Secondary | ICD-10-CM | POA: Diagnosis not present

## 2020-08-23 DIAGNOSIS — I10 Essential (primary) hypertension: Secondary | ICD-10-CM | POA: Diagnosis present

## 2020-08-23 DIAGNOSIS — I70202 Unspecified atherosclerosis of native arteries of extremities, left leg: Secondary | ICD-10-CM | POA: Diagnosis present

## 2020-08-23 LAB — BASIC METABOLIC PANEL
Anion gap: 11 (ref 5–15)
BUN: 26 mg/dL — ABNORMAL HIGH (ref 6–20)
CO2: 21 mmol/L — ABNORMAL LOW (ref 22–32)
Calcium: 9.4 mg/dL (ref 8.9–10.3)
Chloride: 105 mmol/L (ref 98–111)
Creatinine, Ser: 1.39 mg/dL — ABNORMAL HIGH (ref 0.61–1.24)
GFR, Estimated: >60 mL/min (ref >60)
Glucose, Bld: 154 mg/dL — ABNORMAL HIGH (ref 70–99)
Potassium: 4.2 mmol/L (ref 3.5–5.1)
Sodium: 137 mmol/L (ref 135–145)

## 2020-08-23 LAB — HEPATIC FUNCTION PANEL
ALT: 134 U/L — ABNORMAL HIGH (ref 0–44)
AST: 110 U/L — ABNORMAL HIGH (ref 15–41)
Albumin: 3.4 g/dL — ABNORMAL LOW (ref 3.5–5.0)
Alkaline Phosphatase: 132 U/L — ABNORMAL HIGH (ref 38–126)
Bilirubin, Direct: 0.4 mg/dL — ABNORMAL HIGH (ref 0.0–0.2)
Indirect Bilirubin: 1 mg/dL — ABNORMAL HIGH (ref 0.3–0.9)
Total Bilirubin: 1.4 mg/dL — ABNORMAL HIGH (ref 0.3–1.2)
Total Protein: 6.6 g/dL (ref 6.5–8.1)

## 2020-08-23 LAB — RESPIRATORY PANEL BY RT PCR (FLU A&B, COVID)
Influenza A by PCR: NEGATIVE
Influenza B by PCR: NEGATIVE
SARS Coronavirus 2 by RT PCR: NEGATIVE

## 2020-08-23 LAB — TROPONIN I (HIGH SENSITIVITY)
Troponin I (High Sensitivity): 17 ng/L (ref <18)
Troponin I (High Sensitivity): 15 ng/L (ref <18)

## 2020-08-23 LAB — CBC
HCT: 36 % — ABNORMAL LOW (ref 39.0–52.0)
Hemoglobin: 9.9 g/dL — ABNORMAL LOW (ref 13.0–17.0)
MCH: 19.6 pg — ABNORMAL LOW (ref 26.0–34.0)
MCHC: 27.5 g/dL — ABNORMAL LOW (ref 30.0–36.0)
MCV: 71.4 fL — ABNORMAL LOW (ref 80.0–100.0)
Platelets: 369 10*3/uL (ref 150–400)
RBC: 5.04 MIL/uL (ref 4.22–5.81)
RDW: 22.2 % — ABNORMAL HIGH (ref 11.5–15.5)
WBC: 7.1 10*3/uL (ref 4.0–10.5)
nRBC: 0.7 % — ABNORMAL HIGH (ref 0.0–0.2)

## 2020-08-23 LAB — PROTIME-INR
INR: 1.4 — ABNORMAL HIGH (ref 0.8–1.2)
Prothrombin Time: 16.9 seconds — ABNORMAL HIGH (ref 11.4–15.2)

## 2020-08-23 LAB — BRAIN NATRIURETIC PEPTIDE: B Natriuretic Peptide: 1451.1 pg/mL — ABNORMAL HIGH (ref 0.0–100.0)

## 2020-08-23 LAB — ETHANOL: Alcohol, Ethyl (B): <10 mg/dL (ref <10)

## 2020-08-23 LAB — IRON AND TIBC
Iron: 24 ug/dL — ABNORMAL LOW (ref 45–182)
Saturation Ratios: 5 % — ABNORMAL LOW (ref 17.9–39.5)
TIBC: 528 ug/dL — ABNORMAL HIGH (ref 250–450)
UIBC: 504 ug/dL

## 2020-08-23 LAB — POC OCCULT BLOOD, ED: Fecal Occult Bld: NEGATIVE

## 2020-08-23 LAB — SAMPLE TO BLOOD BANK

## 2020-08-23 LAB — MAGNESIUM: Magnesium: 1.8 mg/dL (ref 1.7–2.4)

## 2020-08-23 LAB — FERRITIN: Ferritin: 18 ng/mL — ABNORMAL LOW (ref 24–336)

## 2020-08-23 MED ORDER — IOHEXOL 350 MG/ML SOLN
100.0000 mL | Freq: Once | INTRAVENOUS | Status: AC | PRN
  Administered 2020-08-23: 75 mL via INTRAVENOUS

## 2020-08-23 MED ORDER — FUROSEMIDE 10 MG/ML IJ SOLN
20.0000 mg | Freq: Once | INTRAMUSCULAR | Status: AC
  Administered 2020-08-23: 20 mg via INTRAVENOUS
  Filled 2020-08-23: qty 2

## 2020-08-23 MED ORDER — FUROSEMIDE 10 MG/ML IJ SOLN
60.0000 mg | Freq: Two times a day (BID) | INTRAMUSCULAR | Status: DC
  Administered 2020-08-24 (×2): 60 mg via INTRAVENOUS
  Filled 2020-08-23 (×2): qty 6

## 2020-08-23 MED ORDER — TICAGRELOR 90 MG PO TABS
150.0000 mg | ORAL_TABLET | Freq: Once | ORAL | Status: AC
  Administered 2020-08-23: 150 mg via ORAL
  Filled 2020-08-23: qty 1

## 2020-08-23 MED ORDER — ACETAMINOPHEN 325 MG PO TABS
650.0000 mg | ORAL_TABLET | ORAL | Status: DC | PRN
  Administered 2020-08-25: 650 mg via ORAL
  Filled 2020-08-23: qty 2

## 2020-08-23 MED ORDER — SODIUM CHLORIDE 0.9% FLUSH
3.0000 mL | Freq: Two times a day (BID) | INTRAVENOUS | Status: DC
  Administered 2020-08-23 – 2020-08-25 (×4): 3 mL via INTRAVENOUS

## 2020-08-23 MED ORDER — ATORVASTATIN CALCIUM 40 MG PO TABS
40.0000 mg | ORAL_TABLET | Freq: Every day | ORAL | Status: DC
  Administered 2020-08-23 – 2020-08-25 (×3): 40 mg via ORAL
  Filled 2020-08-23: qty 4
  Filled 2020-08-23 (×2): qty 1

## 2020-08-23 MED ORDER — ONDANSETRON HCL 4 MG/2ML IJ SOLN: 4.0000 mg | Freq: Four times a day (QID) | INTRAMUSCULAR | Status: DC | PRN

## 2020-08-23 MED ORDER — TICAGRELOR 90 MG PO TABS
90.0000 mg | ORAL_TABLET | Freq: Two times a day (BID) | ORAL | Status: AC
  Administered 2020-08-24 (×2): 90 mg via ORAL
  Filled 2020-08-23 (×2): qty 1

## 2020-08-23 MED ORDER — SODIUM CHLORIDE 0.9% FLUSH: 3.0000 mL | INTRAVENOUS | Status: DC | PRN

## 2020-08-23 MED ORDER — SPIRONOLACTONE 25 MG PO TABS
25.0000 mg | ORAL_TABLET | Freq: Every day | ORAL | Status: DC
  Administered 2020-08-24 – 2020-08-25 (×2): 25 mg via ORAL
  Filled 2020-08-23 (×4): qty 1

## 2020-08-23 MED ORDER — CARVEDILOL 3.125 MG PO TABS
3.1250 mg | ORAL_TABLET | Freq: Two times a day (BID) | ORAL | Status: DC
  Administered 2020-08-24 – 2020-08-25 (×3): 3.125 mg via ORAL
  Filled 2020-08-23 (×3): qty 1

## 2020-08-23 MED ORDER — SODIUM CHLORIDE 0.9 % IV SOLN: 250.0000 mL | INTRAVENOUS | Status: DC | PRN

## 2020-08-23 MED ORDER — ALBUTEROL SULFATE HFA 108 (90 BASE) MCG/ACT IN AERS
1.0000 | INHALATION_SPRAY | Freq: Once | RESPIRATORY_TRACT | Status: AC
  Administered 2020-08-23: 2 via RESPIRATORY_TRACT
  Filled 2020-08-23: qty 6.7

## 2020-08-23 MED ORDER — RAMELTEON 8 MG PO TABS
8.0000 mg | ORAL_TABLET | Freq: Every day | ORAL | Status: DC
  Administered 2020-08-23 – 2020-08-24 (×2): 8 mg via ORAL
  Filled 2020-08-23 (×3): qty 1

## 2020-08-23 NOTE — ED Triage Notes (Signed)
Pt c/o increase sob and generalized weakness for a week.

## 2020-08-23 NOTE — H&P (Addendum)
Date: 08/23/2020               Patient Name:  Howard Thompson MRN: [id number]  DOB: [birth year] Age / Sex: 54 y.o., male   PCP: Jamesetta Geralds, MD         Medical Service: Internal Medicine Teaching Service         Attending Physician: Dr. Lenice Pressman    First Contact: Dr. Candie Chroman Pager: [phone]  Second Contact: Dr. Court Joy Pager: [phone]       After Hours (After 5p/  First Contact Pager: [phone]  weekends / holidays): Second Contact Pager: [phone]   Chief Complaint: Shortness of Breath  History of Present Illness:   Howard Thompson is a 54 y.o. year old male with hx of CAD s/p cardiac arrest and STEMI x2 with DES to LAD, HFrEF with EF 25-30%, PAD s/p L femoral bypass, COPD, HLD, myasthenia gravis s/p thymectomy, AVM of colon requiring blood transfusions, iron deficiency anemia, polysubstance use disorder complaining of shortness of breath. He states that he was in his usual state of health until a couple weeks ago when he began experiencing sinus drainage (green) for which he took amoxacillin which initially improved his sinus symptoms. However, had progressive shortness of breath with exertion or speech, orthopnea, and lower extremity edema. He states that he becomes short of breath after walking or climbing a few flight of stairs. He endorses medication noncompliance stating that "I haven't bothered with it lately. Currently, he has run out of his medications because he has not been able to have his prescriptions filled. His dyspnea resolves after he sits upright. He states that he tries to stay away from salty and fatty foods. He endorses melenic stools, fatigue, sinus congestion, drainage, palpitation (yesterday), cough productive of clear sputum, abdominal pain with cough, gagging, lightheadedness but denies fevers, chills, vision changes, chest pain (but reports of chest tightness from SOB), nausea, vomit, diarrhea, constipation, urinary difficulties   ED course: Noted  tachycardia and tachypnea which worsen with exertion. BNP 1400, LFT mildly elevated with noted hx of alcohol use disorder. Hgb stable at 9/9 abd FOBT negative. CTA negative for PE, but cardiac enlargement with small pleural effusion. Rhonchorous breath sounds noted, work of breathing seems improved after starting albuterol and Lasix.  Meds:  Current Outpatient Medications  Medication Instructions  . acetaminophen (TYLENOL) 650 mg, Oral, Every 6 hours PRN  . atorvastatin (LIPITOR) 40 mg, Oral, Daily  . buPROPion (WELLBUTRIN SR) 150 MG 12 hr tablet TAKE 1 TABLET BY MOUTH TWICE A DAY  . carvedilol (COREG) 3.125 mg, Oral, 2 times daily with meals, Please schedule annual appt for refills. [phone]. 1st attempt.  . folic acid (FOLVITE) 1 MG tablet TAKE 1 TABLET BY MOUTH EVERY DAY  . furosemide (LASIX) 60 mg, Oral, Daily  . losartan (COZAAR) 25 MG tablet TAKE 1 TABLET BY MOUTH EVERY DAY  . Multiple Vitamin (MULTIVITAMIN WITH MINERALS) TABS tablet 1 tablet, Oral, Daily  . multivitamin (RENA-VIT) TABS tablet 1 tablet, Oral, Daily at bedtime  . nitroGLYCERIN (NITROSTAT) 0.4 mg, Sublingual, Every 5 min PRN  . pantoprazole (PROTONIX) 40 mg, Oral, 2 times daily  . potassium chloride SA (K-DUR,KLOR-CON) 20 MEQ tablet 20 mEq, Oral, Daily  . spironolactone (ALDACTONE) 25 MG tablet TAKE 1/2 TABLETS BY MOUTH EVERY DAY  . ticagrelor (BRILINTA) 90 mg, Oral, 2 times daily, NEED OV.     Allergies: Allergies as of 08/23/2020  . (No Known Allergies)   Past Medical History:  Diagnosis Date  . Acute renal failure (HCC)    a. a. during 7-05/2018 adm for cardiac arrest, requiring HD temporarily.  . Alcohol abuse   . Anemia   . AVM (arteriovenous malformation) of colon   . Cardiac arrest (HCC) 05/22/2018   Vfib  . CHF (congestive heart failure) (HCC)   . Cocaine abuse (HCC)   . COPD (chronic obstructive pulmonary disease) (HCC)   . Coronary artery disease    a. hx of POBA in [place]. b. out-of-hospital  cardiac arrest 04/2018 (vfib/STEMI) s/p DES to prox LAD (complex admission with GIB, renal failure requiring HD, encephalopathy). c. STEMI 11/2018 after pt stopped Plavix - s/p DES to LAD.  Marland Kitchen Duodenitis   . GI bleed   . History of blood transfusion   . Hyperlipidemia   . Hypokalemia   . Hypomagnesemia   . Iron deficiency anemia   . Ischemic cardiomyopathy    a. EF <25% at time of cath/arrest 04/2018, improved to 40-45% by f/u echo 06/09/18.  . Myasthenia gravis (HCC)   . Myocardial infarction (HCC) 05/22/2018   STEMI  . PAD (peripheral artery disease) (HCC)    a. femoral bypass  . Shock liver    a. during 7-05/2018 adm for cardiac arrest and again in 11/2018.  Marland Kitchen Thrombocytopenia (HCC) 04/2018  . Tobacco abuse   . Ventricular fibrillation (HCC)    Family History:  Family History  Problem Relation Age of Onset  . Alzheimer's disease Father     Social History:  Social History   Tobacco Use  . Smoking status: Current Every Day Smoker    Packs/day: 0.50    Types: Cigarettes  . Smokeless tobacco: Current User  Vaping Use  . Vaping Use: Never used  Substance Use Topics  . Alcohol use: Yes    Comment: none in past several weeks, but normally about 5 beers a week, endorses binge drinking at celebrations  . Drug use: Yes    Types: Cocaine, Marijuana, Methamphetamines, Benzodiazepines    Comment: vaguely discusses useage, but quickly changes subject    Review of Systems: Review of Systems  Constitutional: Positive for malaise/fatigue. Negative for chills and fever.  HENT: Positive for congestion. Negative for sore throat.   Eyes: Negative for blurred vision and double vision.  Respiratory: Positive for cough, sputum production (clear) and shortness of breath.   Cardiovascular: Positive for palpitations (1 episode of fluttering yesterday) and leg swelling (resolved with diuretics). Negative for chest pain ("tightness" with coughing only).  Gastrointestinal: Positive for melena and  nausea. Negative for abdominal pain, constipation, diarrhea and vomiting.  Genitourinary: Negative for dysuria and frequency.  Musculoskeletal: Negative for falls and myalgias.  Skin: Negative for rash.  Neurological: Positive for dizziness (lightheadedneess upon standing). Negative for loss of consciousness.     Physical Exam: Blood pressure 133/87, pulse (!) 102, temperature 97.6 F (36.4 C), temperature source Oral, resp. rate 17, height 5\' 9"  (1.753 m), weight 72.7 kg, SpO2 99 %. Physical Exam Vitals and nursing note reviewed.  Constitutional:      General: He is not in acute distress.    Appearance: He is well-developed.  HENT:     Head: Normocephalic and atraumatic.     Mouth/Throat:     Mouth: Mucous membranes are moist.     Pharynx: Oropharynx is clear.  Eyes:     Extraocular Movements: Extraocular movements intact.  Neck:     Vascular: JVD (moderate) present.  Cardiovascular:     Rate and  Rhythm: Regular rhythm. Tachycardia present.     Heart sounds: Normal heart sounds. No murmur heard.  No friction rub. No gallop.   Pulmonary:     Breath sounds: Examination of the left-upper field reveals wheezing. Examination of the right-lower field reveals rales. Examination of the left-lower field reveals rales. Wheezing and rales (mild) present.     Comments: Speaks in full sentences but occasionally needs to sit up to catch his breath. Dyspnea resolves with sitting up for a few seconds Abdominal:     General: Bowel sounds are normal.     Palpations: Abdomen is soft.     Tenderness: There is no abdominal tenderness. There is no rebound.  Genitourinary:    Rectum: Guaiac result negative.  Musculoskeletal:     Cervical back: Normal range of motion and neck supple.     Right lower leg: No edema.     Left lower leg: No edema.  Skin:    General: Skin is warm and dry.  Neurological:     General: No focal deficit present.     Mental Status: He is alert and oriented to person,  place, and time.     Cranial Nerves: No cranial nerve deficit.  Psychiatric:        Mood and Affect: Mood is anxious.        Behavior: Behavior is agitated.     EKG: personally reviewed my interpretation is sinus tachycardia, no ST changes, R axis deviation  CXR: personally reviewed my interpretation is sternotomy clips, mild pulmonary edema  CT Angio Chest PE W and/or Wo Contrast IMPRESSION:  1. Negative for pulmonary embolism.  2. Cardiac enlargement with left ventricular enlargement. Left coronary stent.  3. Small pleural effusions.  4. Apical emphysema.  5. Evidence of prior granulomatous disease with calcified granuloma in the lingula and calcified left hilar lymph node.    Assessment & Plan by Problem: Principal Problem:   Acute decompensated heart failure (HCC) Active Problems:   Ischemic cardiomyopathy   Iron deficiency anemia   CAD in native artery   AVM (arteriovenous malformation) of small bowel, acquired with hemorrhage   Essential hypertension   Abnormal LFTs   Howard Thompson is a 54 y.o. year old male with hx of CAD s/p cardiac arrest and STEMI x2 with DES to LAD, HFrEF with EF 25-30%, PAD s/p L femoral bypass, COPD, HLD, myasthenia gravis s/p thymectomy, AVM of colon requiring blood transfusions, iron deficiency anemia, alcohol use disorder, cocaine use disorder, tobacco use disorder presenting for SOB, DOE, orthopnea, fatigue over the past few weeks.  HFpEF exacerbation Ischemic cardiomyopathy Last echo in 11/2018 with EF 25-30%, severe hypokinesis of L ventricular anteroseptal wall. Presents with several weeks of DOE, orthopnea, fatigue. Recently had sinus congestion with green-yellow discharge which resolved with Abx. Also endorses medication non-compliance due to difficulty getting refills. BNP 1451 this admission. Home meds of Coreg 3.125mg  BID, losartan 25mg  daily, Lasix 60mg  daily, spironolactone 12.5mg  daily, and Klor-con 67mEq.  -resume home Coreg 3.125mg   BID -Lasix 11mh IV BID for now -spironolactone 25mg  daily -small pleural effusions, consider diagnostic thoracentesis -if not improving, consider getting ESR, CRP, cardiac MRI for possible myocarditis from recent URI  CAD s/p STEMI x2 and DES to LAD PAD s/p L femoral bypass HLD He has history of STEMI complicated by VF arrest in [place], had LAD stented. DAPT was discontinued due to GI bleeding. In Feb 2020 had another STEMI, and upon cath was found to  have occlusion of his prior LAD stent, treated with thrombectomy and balloon dilation, and another stent was deployed. Other vessels without severe stenosis. Initially DAPT but ASA was stopped due to GI bleed, continues on Brilinta. During hospitalization for cardiac arrest in 2019 had shock liver which resolved and acute renal failure which resolved.  Troponin 17 > 15 this admission. No chest pain currently. -continue home Brilinta, can stop if further signs of GI bleed -continue home Lipitor 40mg   Dark stools Hx AVM in colon requiring blood transfusion Fe deficiency anemia Only anticoagulant currently is brilinta. Hgb stable at 9/9 abd FOBT negative. In March 2020 had endoscopy and enteroscopy by [facility] demonstrating several AVMs in the small bowel, the largest of which was treated with argon plasma coagulation. Last iron labs in March 2020 with Fe of 8, ferritin 14. -f/u iron labs -continue Brilianta and monitor H/H closely -daily CBC  COPD Mild wheezing on exam, was reportedly worse per ED provider prior to albuterol. -albuterol prn  Sleep apnea Hx of sleep apnea per EMR, but unable to tolerate CPAP mask in the past. Complaining of difficulty sleeping due to difficulty breathing, nurse noted several apnea episodes on monitoring. Cannot find sleep study on chart review. -trial of CPAP -f/u outpatient for sleep study  AKI Creatinine of 1.39 on admission. Typically has normal renal function. Possibly  cardiorenal. -hold home losartan -f/u urine creatinine and urea -daily BMP  Transaminitis  AST/ALF of 110/134 on admission. Hx of alcohol use disorder, but denies use in past few weeks. Has previously had 2:1 AST:ALT ratio. Negative Hep B and C testing in 2019. -f/u morning CMP -f/u ethanol level  HTN 152/106 on admission. Home meds of Coreg 3.125mg  BID, losartan 25mg  daily, Lasix 60mg  daily, spironolactone 12.5mg  daily.  -resume as above, except holding losartan for AKI   Dispo: Admit patient to Inpatient with expected length of stay greater than 2 midnights.  Signed: Buck Au, MD 08/23/2020, 9:41 PM  Pager: [phone]  After 5pm on weekdays and 1pm on weekends: On Call pager: [phone]

## 2020-08-23 NOTE — ED Notes (Signed)
Patient provided with PO fluids, room temp adjusted. Denies further needs at this time.

## 2020-08-23 NOTE — ED Notes (Signed)
Patient c/o orthopnea. Pt provided with multiple pillows to assist with same. Reports relief with repositioning. Denies further needs.

## 2020-08-23 NOTE — ED Provider Notes (Signed)
[facility] EMERGENCY DEPARTMENT Provider Note   CSN: [id number] Arrival date & time: 08/23/20  1218     History Chief Complaint  Patient presents with  . Shortness of Breath    Howard Thompson is a 54 y.o. male past medical history of AVM, cardiac arrest, CHF, COPD, renal failure, anemia who presents for evaluation of shortness of breath, generalized weakness over the last week.  He states he first started noticing days feeling bad about a week ago.  He thought it was a cold as he was coughing and coughing up green phlegm.  No hemoptysis.  He states he had some subjective fever chills but never measured a temperature.  He had some leftover Augmentin from a previous infection that he took and states that he thought he was getting better but his shortness of breath persisted.  He notes that every time he takes in 4-5 steps, he gets very short of breath which is unusual for him.  He states he does not have any true chest pain but he feels like his chest is tight and is worse with deep inspiration.  He states he is also noticed some dark stools over the last couple days.  No bright red blood.  He is not vomiting any blood.  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  He did have some leg swelling over the last week but took some Lasix which he states helped it.  He is on Brilinta but states he has not been taking over the last few days.  He denies any abdominal pain, vomiting.  He has been vaccinated for Covid.  No Covid exposure that he knows of.  He has a history of lupus. He denies any exogenous hormone use, recent immobilization, prior history of DVT/PE, recent surgery, leg swelling, or long travel.   The history is provided by the patient.       Past Medical History:  Diagnosis Date  . Acute renal failure ([place])    a. a.  during 7-05/2018 adm for cardiac arrest, requiring HD temporarily.  . Alcohol abuse   . Anemia   . AVM (arteriovenous malformation) of colon   . Cardiac arrest ([place]) 05/22/2018   Vfib  . CHF (congestive heart failure) ([place])   . Cocaine abuse ([place])   . COPD (chronic obstructive pulmonary disease) ([place])   . Coronary artery disease    a. hx of POBA in [place]. b. out-of-hospital cardiac arrest 04/2018 (vfib/STEMI) s/p DES to prox LAD (complex admission with GIB, renal failure requiring HD, encephalopathy). c. STEMI 11/2018 after pt stopped Plavix - s/p DES to LAD.  Marland Kitchen Duodenitis   . GI bleed   . History of blood transfusion   . Hyperlipidemia   . Hypokalemia   . Hypomagnesemia   . Iron deficiency anemia   . Ischemic cardiomyopathy    a. EF <25% at time of cath/arrest 04/2018, improved to 40-45% by f/u echo 06/09/18.  . Myasthenia gravis ([place])   . Myocardial infarction ([place]) 05/22/2018   STEMI  . PAD (peripheral artery disease) ([place])    a. femoral bypass  . Shock liver    a. during 7-05/2018 adm for cardiac arrest and again in 11/2018.  Marland Kitchen Thrombocytopenia ([place]) 04/2018  . Tobacco abuse   . Ventricular fibrillation [facility])     Patient Active Problem  List   Diagnosis Date Noted  . Acute on chronic systolic (congestive) heart failure ([place]) 03/14/2019  . Alcohol abuse 03/14/2019  . Essential hypertension 03/14/2019  . Acute combined systolic and diastolic heart failure ([place])   . AVM (arteriovenous malformation) of small bowel, acquired with hemorrhage   . GI bleeding 01/19/2019  . Iron deficiency anemia 12/20/2018  . CAD in native artery 12/20/2018  . Acute blood loss anemia   . Antiplatelet or antithrombotic long-term use   . Anemia due to blood loss 07/14/2018  . Protein-calorie malnutrition, severe 06/11/2018  . Anemia due to chronic blood loss   . Hyperlipidemia LDL goal <70   . Ischemic cardiomyopathy     Past Surgical History:  Procedure Laterality Date  . ATHERECTOMY    . BIOPSY   07/16/2018   Procedure: BIOPSY;  Surgeon: Gatha Mayer, MD;  Location: [facility] ENDOSCOPY;  Service: Endoscopy;;  . CARDIAC CATHETERIZATION  05/22/2018   stent  . COLONOSCOPY WITH PROPOFOL N/A 07/16/2018   Procedure: COLONOSCOPY WITH PROPOFOL;  Surgeon: Gatha Mayer, MD;  Location: [facility] ENDOSCOPY;  Service: Endoscopy;  Laterality: N/A;  . CORONARY ANGIOPLASTY    . CORONARY STENT INTERVENTION N/A 05/22/2018   Procedure: CORONARY STENT INTERVENTION;  Surgeon: Lorretta Harp, MD;  Location: [place] CV LAB;  Service: Cardiovascular;  Laterality: N/A;  . CORONARY/GRAFT ACUTE MI REVASCULARIZATION N/A 12/16/2018   Procedure: CORONARY/GRAFT ACUTE MI REVASCULARIZATION;  Surgeon: Troy Sine, MD;  Location: [place] CV LAB;  Service: Cardiovascular;  Laterality: N/A;  . ENTEROSCOPY N/A 01/22/2019   Procedure: ENTEROSCOPY;  Surgeon: Thornton Park, MD;  Location: [place];  Service: Gastroenterology;  Laterality: N/A;  . ESOPHAGOGASTRODUODENOSCOPY (EGD) WITH PROPOFOL N/A 06/10/2018   Procedure: ESOPHAGOGASTRODUODENOSCOPY (EGD) WITH PROPOFOL;  Surgeon: Ladene Artist, MD;  Location: [facility] ENDOSCOPY;  Service: Endoscopy;  Laterality: N/A;  . ESOPHAGOGASTRODUODENOSCOPY (EGD) WITH PROPOFOL N/A 07/16/2018   Procedure: ESOPHAGOGASTRODUODENOSCOPY (EGD) WITH PROPOFOL;  Surgeon: Gatha Mayer, MD;  Location: [place];  Service: Endoscopy;  Laterality: N/A;  . FEMORAL ARTERY - POPLITEAL ARTERY BYPASS GRAFT    . GIVENS CAPSULE STUDY N/A 01/20/2019   Procedure: GIVENS CAPSULE STUDY;  Surgeon: Gatha Mayer, MD;  Location: [facility] ENDOSCOPY;  Service: Endoscopy;  Laterality: N/A;  . HOT HEMOSTASIS N/A 07/16/2018   Procedure: HOT HEMOSTASIS (ARGON PLASMA COAGULATION/BICAP);  Surgeon: Gatha Mayer, MD;  Location: [facility] ENDOSCOPY;  Service: Endoscopy;  Laterality: N/A;  . HOT HEMOSTASIS N/A 01/22/2019   Procedure: HOT HEMOSTASIS (ARGON PLASMA COAGULATION/BICAP);  Surgeon: Thornton Park, MD;  Location: [place];  Service: Gastroenterology;  Laterality: N/A;  . IABP INSERTION N/A 12/16/2018   Procedure: IABP Insertion;  Surgeon: Troy Sine, MD;  Location: [place] CV LAB;  Service: Cardiovascular;  Laterality: N/A;  . IR FLUORO GUIDE CV LINE RIGHT  05/31/2018  . IR REMOVAL TUN CV CATH W/O FL  06/21/2018  . IR US GUIDE VASC ACCESS RIGHT  05/31/2018  . LEFT HEART CATH AND CORONARY ANGIOGRAPHY N/A 05/22/2018   Procedure: LEFT HEART CATH AND CORONARY ANGIOGRAPHY;  Surgeon: Lorretta Harp, MD;  Location: [place] CV LAB;  Service: Cardiovascular;  Laterality: N/A;  . LEFT HEART CATH AND CORONARY ANGIOGRAPHY N/A 12/16/2018   Procedure: LEFT HEART CATH AND CORONARY ANGIOGRAPHY;  Surgeon: Troy Sine, MD;  Location: [place] CV LAB;  Service: Cardiovascular;  Laterality: N/A;       Family History  Problem Relation Age of Onset  .  Alzheimer's disease Father     Social History   Tobacco Use  . Smoking status: Current Every Day Smoker    Packs/day: 1.00    Types: Cigarettes  . Smokeless tobacco: Current User  Vaping Use  . Vaping Use: Never used  Substance Use Topics  . Alcohol use: Yes    Comment: vague regarding current use  . Drug use: Yes    Types: Cocaine, Marijuana, Methamphetamines, Benzodiazepines    Comment: vaguely discusses useage, but quickly changes subject    Home Medications Prior to Admission medications   Medication Sig Start Date End Date Taking? Authorizing Provider  acetaminophen (TYLENOL) 325 MG tablet Take 650 mg by mouth every 6 (six) hours as needed for mild pain or moderate pain.   Yes [provider]  atorvastatin (LIPITOR) 40 MG tablet Take 40 mg by mouth daily. 02/20/19  Yes [provider]  carvedilol (COREG) 3.125 MG tablet Take 1 tablet (3.125 mg total) by mouth 2 (two) times daily with a meal. Please schedule annual appt for refills. [phone]. 1st attempt. 01/16/20  Yes Dunn, Dayna N, PA-C  furosemide (LASIX) 20 MG  tablet Take 3 tablets (60 mg total) by mouth daily. Patient taking differently: Take 20 mg by mouth daily.  03/15/19  Yes Vann, Jessica U, DO  losartan (COZAAR) 25 MG tablet TAKE 1 TABLET BY MOUTH EVERY DAY Patient taking differently: Take 25 mg by mouth daily.  11/23/19  Yes Troy Sine, MD  multivitamin (RENA-VIT) TABS tablet Take 1 tablet by mouth at bedtime. 02/20/19  Yes [provider]  nitroGLYCERIN (NITROSTAT) 0.4 MG SL tablet Place 0.4 mg under the tongue every 5 (five) minutes as needed for chest pain (max 3 doses).    Yes [provider]  spironolactone (ALDACTONE) 25 MG tablet TAKE 1/2 TABLETS BY MOUTH EVERY DAY Patient taking differently: Take 25 mg by mouth daily.  10/17/19  Yes Dunn, Nedra Hai, PA-C  ticagrelor (BRILINTA) 90 MG TABS tablet Take 1 tablet (90 mg total) by mouth 2 (two) times daily. NEED OV. Patient taking differently: Take 90 mg by mouth 2 (two) times daily.  04/17/20  Yes Troy Sine, MD  buPROPion [facility] SR) 150 MG 12 hr tablet TAKE 1 TABLET BY MOUTH TWICE A DAY Patient not taking: Reported on 08/23/2020 09/20/19   Troy Sine, MD  folic acid (FOLVITE) 1 MG tablet TAKE 1 TABLET BY MOUTH EVERY DAY Patient not taking: Reported on 08/23/2020 06/22/19   Troy Sine, MD  Multiple Vitamin (MULTIVITAMIN WITH MINERALS) TABS tablet Take 1 tablet by mouth daily. Patient not taking: Reported on 03/14/2019 07/17/18   Hosie Poisson, MD  pantoprazole (PROTONIX) 40 MG tablet Take 1 tablet (40 mg total) by mouth 2 (two) times daily. Patient not taking: Reported on 08/23/2020 01/22/19 03/19/19  Rehman, Areeg N, DO  potassium chloride SA (K-DUR,KLOR-CON) 20 MEQ tablet Take 1 tablet (20 mEq total) by mouth daily. Patient not taking: Reported on 08/23/2020 01/19/19   Charlie Pitter, PA-C    Allergies    Patient has no known allergies.  Review of Systems   Review of Systems  Constitutional: Positive for chills and fever.  Respiratory: Positive for  cough, chest tightness and shortness of breath.   Cardiovascular: Negative for chest pain.  Gastrointestinal: Negative for abdominal pain, nausea and vomiting.  Genitourinary: Negative for dysuria and hematuria.  Neurological: Negative for headaches.  All other systems reviewed and are negative.   Physical Exam Updated  Vital Signs BP 133/83   Pulse (!) 111   Temp 97.6 F (36.4 C) (Oral)   Resp (!) 21   Ht _0  (1.753 m)   Wt 72.7 kg   SpO2 99%   BMI 23.67 kg/m   Physical Exam Vitals and nursing note reviewed.  Constitutional:      Appearance: Normal appearance. He is well-developed.  HENT:     Head: Normocephalic and atraumatic.  Eyes:     General: Lids are normal. Scleral icterus present.     Conjunctiva/sclera: Conjunctivae normal.     Pupils: Pupils are equal, round, and reactive to light.  Cardiovascular:     Rate and Rhythm: Normal rate and regular rhythm.     Pulses: Normal pulses.     Heart sounds: Normal heart sounds. No murmur heard.  No friction rub. No gallop.   Pulmonary:     Effort: Pulmonary effort is normal. Tachypnea present.     Breath sounds: Rhonchi present.     Comments: Diffuse rhonchi noted.  Obviously tachypneic.  Able speak in full sentences without any difficulty.  No obvious wheezing.  Tachypnea and increased work of breathing noted. Abdominal:     Palpations: Abdomen is soft. Abdomen is not rigid.     Tenderness: There is no abdominal tenderness. There is no guarding.  Genitourinary:    Comments: The exam was performed with a chaperone present. Digital Rectal Exam reveals sphincter with good tone. No external hemorrhoids. No masses or fissures. Stool color is brown with no overt blood. Musculoskeletal:        General: Normal range of motion.     Cervical back: Full passive range of motion without pain.  Skin:    General: Skin is warm and dry.     Capillary Refill: Capillary refill takes less than 2 seconds.  Neurological:     Mental  Status: He is alert and oriented to person, place, and time.  Psychiatric:        Speech: Speech normal.     ED Results / Procedures / Treatments   Labs (all labs ordered are listed, but only abnormal results are displayed) Labs Reviewed  BASIC METABOLIC PANEL - Abnormal; Notable for the following components:      Result Value   CO2 21 (*)    Glucose, Bld 154 (*)    BUN 26 (*)    Creatinine, Ser 1.39 (*)    All other components within normal limits  CBC - Abnormal; Notable for the following components:   Hemoglobin 9.9 (*)    HCT 36.0 (*)    MCV 71.4 (*)    MCH 19.6 (*)    MCHC 27.5 (*)    RDW 22.2 (*)    nRBC 0.7 (*)    All other components within normal limits  HEPATIC FUNCTION PANEL - Abnormal; Notable for the following components:   Albumin 3.4 (*)    AST 110 (*)    ALT 134 (*)    Alkaline Phosphatase 132 (*)    Total Bilirubin 1.4 (*)    Bilirubin, Direct 0.4 (*)    Indirect Bilirubin 1.0 (*)    All other components within normal limits  BRAIN NATRIURETIC PEPTIDE - Abnormal; Notable for the following components:   B Natriuretic Peptide 1,451.1 (*)    All other components within normal limits  RESPIRATORY PANEL BY RT PCR (FLU A&B, COVID)  POC OCCULT BLOOD, ED  SAMPLE TO BLOOD BANK  TROPONIN I (HIGH SENSITIVITY)  TROPONIN I (HIGH SENSITIVITY)    EKG None  Radiology DG Chest 2 View  Result Date: 08/23/2020 CLINICAL DATA:  Chest pain. EXAM: CHEST - 2 VIEW COMPARISON:  Mar 14, 2019. FINDINGS: Stable cardiomegaly is noted with mild central pulmonary vascular congestion. Mild bilateral perihilar edema may be present. Sternotomy wires are noted. No pneumothorax or pleural effusion is noted. Bony thorax is unremarkable. IMPRESSION: Stable cardiomegaly with mild central pulmonary vascular congestion. Mild bilateral perihilar edema may be present. Electronically Signed   By: Marijo Conception M.D.   On: 08/23/2020 13:08   CT Angio Chest PE W and/or Wo Contrast  Result  Date: 08/23/2020 CLINICAL DATA:  Short of breath and weakness 1 week. Rule out pulmonary embolism. EXAM: CT ANGIOGRAPHY CHEST WITH CONTRAST TECHNIQUE: Multidetector CT imaging of the chest was performed using the standard protocol during bolus administration of intravenous contrast. Multiplanar CT image reconstructions and MIPs were obtained to evaluate the vascular anatomy. CONTRAST:  59m OMNIPAQUE IOHEXOL 350 MG/ML SOLN COMPARISON:  Chest x-ray 08/23/2020 FINDINGS: Cardiovascular: Cardiac enlargement with left ventricular enlargement. Left coronary stent. Median sternotomy. No pericardial effusion. Pulmonary arteries normal in caliber. Negative for pulmonary embolism. Mild atherosclerotic calcification aortic arch without aneurysm. Lack of opacification of the aorta precludes evaluation for dissection. Mediastinum/Nodes: 23 mm calcified left lower hilar lymph node. 16 mm noncalcified right hilar lymph node. No mediastinal mass. Lungs/Pleura: Small bilateral pleural effusions. Mild emphysema most prominent the apices. 4 mm calcified granuloma in the lingula. No acute infiltrate or mass lesion in the lungs. Upper Abdomen: No acute abnormality. Musculoskeletal: Mild thoracic degenerative changes. No acute skeletal abnormality. Review of the MIP images confirms the above findings. IMPRESSION: 1. Negative for pulmonary embolism. 2. Cardiac enlargement with left ventricular enlargement. Left coronary stent. 3. Small pleural effusions. 4. Apical emphysema. 5. Evidence of prior granulomatous disease with calcified granuloma in the lingula and calcified left hilar lymph node. Electronically Signed   By: CFranchot GalloM.D.   On: 08/23/2020 16:52    Procedures Procedures (including critical care time)  Medications Ordered in ED Medications  furosemide (LASIX) injection 20 mg (20 mg Intravenous Given 08/23/20 1459)  albuterol (VENTOLIN HFA) 108 (90 Base) MCG/ACT inhaler 1-2 puff (2 puffs Inhalation Given 08/23/20  1508)  iohexol (OMNIPAQUE) 350 MG/ML injection 100 mL (75 mLs Intravenous Contrast Given 08/23/20 1642)  ticagrelor (BRILINTA) tablet 150 mg (150 mg Oral Given 08/23/20 1822)  furosemide (LASIX) injection 20 mg (20 mg Intravenous Given 08/23/20 1822)    ED Course  I have reviewed the triage vital signs and the nursing notes.  Pertinent labs & imaging results that were available during my care of the patient were reviewed by me and considered in my medical decision making (see chart for details).    MDM Rules/Calculators/A&P                          54year old male who presents for evaluation of shortness of breath, generalized weakness x1 week.  He states even taking 4-5 steps gets admitted which he states is abnormal for him.  Reports some chest tightness.  He had some swelling in his legs a couple days ago and he said he took some Lasix that he had at home and that helped but states he feels still is having shortness of breath.  On initial arrival, he is afebrile but is tachycardic and hypertensive.  No obvious hypoxia.  He is tachypneic and has diffuse  rhonchi noted on exam.  He states that he smokes but is never used an inhaler.  Do not hear any obvious wheezing.  Question if this is CHF exacerbation versus infectious process versus COVID-19.  He has been vaccinated so we will plan to test.  BNP is elevated at 1451.1.  His previous BMPs have been in the 200-700 range.  Troponin is normal.  CBC shows no leukocytosis.  Hemoglobin stable at 9.9.  LFTs show AST of 110, ALT 134, alk phos of 132, total bili 1.4.  Elevation of LFTs is new.  BMP shows bicarb of 21, BUN 26, creatinine 1.39.  Chest x-ray shows stable cardiomegaly with mild central pulmonary vascular congestion.  Bilateral mild bilateral perihilar edema may be present.  He had an echo done that showed an EF of 25-30% in February 2020.  He is persistently tachycardic and very short of breath.  While doing a rectal exam, he had  obvious tachypnea.  Given his tachycardia and history of lupus, plan for CTA for evaluation of PE.  CTA shows no evidence of PE.  Is cardiac enlargement with left ventricular enlargement.  Left coronary stent.  Small pleural effusions noted.  Atypical emphysema noted.  Evidence of prior granulomatosis disease with calcified granuloma in the lingula and calcified left hilar lymph node.  At this time, he is still tachycardic and still previously has increased work of breathing when he walks around.  Feel he needs admission for diuresis.  Discussed with internal medicine team.  Will accept patient for admission.   Portions of this note were generated with Lobbyist. Dictation errors may occur despite best attempts at proofreading.  Howard Thompson was evaluated in Emergency Department on 08/23/2020 for the symptoms described in the history of present illness. He was evaluated in the context of the global COVID-19 pandemic, which necessitated consideration that the patient might be at risk for infection with the SARS-CoV-2 virus that causes COVID-19. Institutional protocols and algorithms that pertain to the evaluation of patients at risk for COVID-19 are in a state of rapid change based on information released by regulatory bodies including the CDC and federal and state organizations. These policies and algorithms were followed during the patient's care in the ED.  Portions of this note were generated with Lobbyist. Dictation errors may occur despite best attempts at proofreading.    Final Clinical Impression(s) / ED Diagnoses Final diagnoses:  Acute congestive heart failure, unspecified heart failure type ([place])  Anemia, unspecified type  Elevated LFTs    Rx / DC Orders ED Discharge Orders    None       Volanda Napoleon, PA-C 08/23/20 [place]arg[place], [place], DO 08/24/20 [phone]

## 2020-08-23 NOTE — Hospital Course (Addendum)
Admitted 08/23/2020  Allergies: Patient has no known allergies. Pertinent Hx: ICM, HFrEF EF 25-30%, CAD s/p PCI, AVM, HTN, IDA  54 y.o. male p/w SOB  * AoC HFrEF: On IV Lasix 60 BID. With AKI & Cardio-hepatic syndrome, might be suggestive for mild early cardiogenic shock. Might re  Consults: None  Meds: *** VTE ppx: *** IVF: *** Diet: ***     Problems addressed:   Acute on Chronic HFrEF exacerbation (Ischemic cardiomyopathy): Patient [facility] with complaints of several weeks of worsening DOE, orthopnea, fatigue due to volume overload.  His BNP level at admission was 1451, consistent with acute on chronic systolic heart failure. EKG on admission showed sinus tachycardia, no ST changes, and R axis deviation. Troponin x2 were negative (17 > 15). Echo on 08/24/2020 shows EF 25-30% with severe MR and slightly elevated RV systolic pressures. Patient was diuresed with IV Lasix 60 mg and responded well. Cardiology was consulted and recommended to transition to PO Lasix 40 mg and to continue home regimen including Coreg, spironolactone, lasix, and Lipitor. Patient is encouraged to continue follow up with [facility] in [place] with Dr. Stanford Breed. It was suggested that he begin St[facility] as an out patient if BP and creatinine levels allow for it. Additionally, our CV surgery group was consulted to discuss future options of valve repair/replacement.   CAD s/p STEMI x2 and DES to LAD: Patient has a history of MI with DES in LAD in 2019 and in-stent thrombosis in 2020, and PAD s/p left femoral bypass. He denies any chest pain throughout hospital admission. Cardiology recommended that he discontinue Brilinta, and begin plavix monotherapy (given history of IDA 2/2 to GI bleeding). He should also continue his other heart medications as discussed above. Patient is encouraged to be compliant with his home medications, and avoid any lifestyle risk factors (I.e. substance abuse) to help prevent further  episodes of exacerbation.

## 2020-08-24 ENCOUNTER — Inpatient Hospital Stay (HOSPITAL_COMMUNITY): Payer: No Typology Code available for payment source

## 2020-08-24 DIAGNOSIS — I255 Ischemic cardiomyopathy: Secondary | ICD-10-CM

## 2020-08-24 DIAGNOSIS — I2510 Atherosclerotic heart disease of native coronary artery without angina pectoris: Secondary | ICD-10-CM

## 2020-08-24 DIAGNOSIS — I351 Nonrheumatic aortic (valve) insufficiency: Secondary | ICD-10-CM

## 2020-08-24 DIAGNOSIS — I5043 Acute on chronic combined systolic (congestive) and diastolic (congestive) heart failure: Secondary | ICD-10-CM | POA: Diagnosis not present

## 2020-08-24 DIAGNOSIS — R945 Abnormal results of liver function studies: Secondary | ICD-10-CM | POA: Diagnosis not present

## 2020-08-24 DIAGNOSIS — I10 Essential (primary) hypertension: Secondary | ICD-10-CM

## 2020-08-24 DIAGNOSIS — I25810 Atherosclerosis of coronary artery bypass graft(s) without angina pectoris: Secondary | ICD-10-CM

## 2020-08-24 DIAGNOSIS — N179 Acute kidney failure, unspecified: Secondary | ICD-10-CM

## 2020-08-24 DIAGNOSIS — D509 Iron deficiency anemia, unspecified: Secondary | ICD-10-CM

## 2020-08-24 DIAGNOSIS — I350 Nonrheumatic aortic (valve) stenosis: Secondary | ICD-10-CM

## 2020-08-24 DIAGNOSIS — I340 Nonrheumatic mitral (valve) insufficiency: Secondary | ICD-10-CM

## 2020-08-24 LAB — CBC
HCT: 31 % — ABNORMAL LOW (ref 39.0–52.0)
Hemoglobin: 8.9 g/dL — ABNORMAL LOW (ref 13.0–17.0)
MCH: 19.8 pg — ABNORMAL LOW (ref 26.0–34.0)
MCHC: 28.7 g/dL — ABNORMAL LOW (ref 30.0–36.0)
MCV: 68.9 fL — ABNORMAL LOW (ref 80.0–100.0)
Platelets: 307 10*3/uL (ref 150–400)
RBC: 4.5 MIL/uL (ref 4.22–5.81)
RDW: 21.5 % — ABNORMAL HIGH (ref 11.5–15.5)
WBC: 7.2 10*3/uL (ref 4.0–10.5)
nRBC: 0.6 % — ABNORMAL HIGH (ref 0.0–0.2)

## 2020-08-24 LAB — COMPREHENSIVE METABOLIC PANEL WITH GFR
ALT: 111 U/L — ABNORMAL HIGH (ref 0–44)
AST: 76 U/L — ABNORMAL HIGH (ref 15–41)
Albumin: 2.8 g/dL — ABNORMAL LOW (ref 3.5–5.0)
Alkaline Phosphatase: 114 U/L (ref 38–126)
Anion gap: 10 (ref 5–15)
BUN: 24 mg/dL — ABNORMAL HIGH (ref 6–20)
CO2: 25 mmol/L (ref 22–32)
Calcium: 8.9 mg/dL (ref 8.9–10.3)
Chloride: 102 mmol/L (ref 98–111)
Creatinine, Ser: 1.26 mg/dL — ABNORMAL HIGH (ref 0.61–1.24)
GFR, Estimated: >60 mL/min
Glucose, Bld: 134 mg/dL — ABNORMAL HIGH (ref 70–99)
Potassium: 3.1 mmol/L — ABNORMAL LOW (ref 3.5–5.1)
Sodium: 137 mmol/L (ref 135–145)
Total Bilirubin: 1.1 mg/dL (ref 0.3–1.2)
Total Protein: 5.7 g/dL — ABNORMAL LOW (ref 6.5–8.1)

## 2020-08-24 LAB — PROTIME-INR
INR: 1.4 — ABNORMAL HIGH (ref 0.8–1.2)
Prothrombin Time: 16.6 seconds — ABNORMAL HIGH (ref 11.4–15.2)

## 2020-08-24 LAB — ECHOCARDIOGRAM COMPLETE
AR max vel: 1.16 cm2
AV Area VTI: 1.34 cm2
AV Area mean vel: 1.09 cm2
AV Mean grad: 13 mmHg
AV Peak grad: 22.1 mmHg
Ao pk vel: 2.35 m/s
Area-P 1/2: 2.9 cm2
Height: 69 in
MV M vel: 4.54 m/s
MV Peak grad: 82.4 mmHg
P 1/2 time: 414 msec
Radius: 0.6 cm
S' Lateral: 5.2 cm
Weight: 2564.39 oz

## 2020-08-24 LAB — CREATININE, URINE, RANDOM: Creatinine, Urine: 48.46 mg/dL

## 2020-08-24 LAB — HIV ANTIBODY (ROUTINE TESTING W REFLEX): HIV Screen 4th Generation wRfx: NONREACTIVE

## 2020-08-24 LAB — MAGNESIUM: Magnesium: 1.7 mg/dL (ref 1.7–2.4)

## 2020-08-24 MED ORDER — CLOPIDOGREL BISULFATE 75 MG PO TABS
75.0000 mg | ORAL_TABLET | Freq: Every day | ORAL | Status: DC
  Administered 2020-08-25: 75 mg via ORAL
  Filled 2020-08-24: qty 1

## 2020-08-24 MED ORDER — POTASSIUM CHLORIDE CRYS ER 20 MEQ PO TBCR
40.0000 meq | EXTENDED_RELEASE_TABLET | Freq: Two times a day (BID) | ORAL | Status: AC
  Administered 2020-08-24 (×2): 40 meq via ORAL
  Filled 2020-08-24 (×3): qty 2

## 2020-08-24 MED ORDER — FUROSEMIDE 40 MG PO TABS
40.0000 mg | ORAL_TABLET | Freq: Every day | ORAL | Status: DC
  Administered 2020-08-25: 40 mg via ORAL
  Filled 2020-08-24: qty 1

## 2020-08-24 MED ORDER — MAGNESIUM SULFATE 2 GM/50ML IV SOLN
2.0000 g | Freq: Once | INTRAVENOUS | Status: AC
  Administered 2020-08-24: 2 g via INTRAVENOUS
  Filled 2020-08-24: qty 50

## 2020-08-24 NOTE — ED Notes (Signed)
Brought pt gingerale 

## 2020-08-24 NOTE — ED Notes (Signed)
Pt c/o not being able to sleep d/t waking up feeling like he can't breathe. MD made aware- states pt has hx of osa but was previously unable to tolerate cpap- states it was too late to try cpap tonight, but will try tonight

## 2020-08-24 NOTE — Consult Note (Signed)
Cardiology Consultation:   Patient ID: Howard Thompson MRN: [id number]; DOB: [birth year]  Admit date: 08/23/2020 Date of Consult: 08/24/2020  Primary Care Provider: Jamesetta Thompson, [facility] Cardiologist: Shelva Majestic, MD  [place] Electrophysiologist:  None    Patient Profile:   Howard Thompson is a 54 y.o. male with a hx of CAD s/p Vfib arrest and STEMI in 04/2018 and a second STEMI in 11/2018 due , HFrEF with EF 25-30%, HTN, HLD, COPD, upper GI bleed, MG s/p thymectomy, and polysubstance abuse disorder who is being seen today for the evaluation of CHF at the request of Dr. Rebeca Alert.  History of Present Illness:   Howard Thompson a history of CAD with Vfib arrest and STEMI 04/2018 found to have 100% occluded LAD treated with DES. Hospitalization complicated by cardiogenic shock and ICM with EF 25-30%, GI bleed, acute renal failure requiring temporary dialysis, shock liver, acute encephalopathy, anemia and duodenitis. EF prior to discharge improved to 40-45% and he did not require LifeVest at discharge. He was readmitted with sever GI bleed and AVMS. ASA/brilinta DAPT was changed to ASA and plavix. Pt declined follow up and discharged to follow up at [facility].  Pt self-discontinued plavix 11/2018 (question of OP GI bleed) and presented with STEMI with thrombotic occlusion of LAD stent. Pt presented after 30 hours of chest pain as a late-presenting STEMI. LHC with extensive thrombus burden treated with DES. Cardiogenic shock required IABP. Pt lost to follow up. He was seen in consult during an admission 01/19/19 for acute GI bleed. Cardiology consulted for assistance with DAPT. ASA was D/C'ed but brilinta continued. Pt did not follow up with cardiology.   [name] history of smoking, cocaine use, THC, BZs, and amphetamines.   Pt was admitted to [facility] 08/23/20 with dyspnea, orthopnea, and lower extremity swelling. Pt reports recent sinus infection that he self-treated with augmentin. He states the  sinus infection spread to his chest. He reports trouble "getting into doctors" and getting refills on his medications.   Workup: BNP 1451 CXR with mild pulmonary vascular congestion HS troponin  17 --> 15 sCr 1.26, K 3.1 Hb 8.9 AST/ALT: 110 / 134 Alk phos 132  Home regimen: brilinta, coreg, losartan, spiro, lasix 60 mg daily but patient only taking 20 mg daily.  Cardiology consulted for CHF exacerbation. On my interview, he reports sinus infection last week treated with his personal Augmentin. He reports that shortly after this he started having what sounds like PND and orthopnea. Chest pain was associated with coughing. His sinus infection is now clear. He reports that he Thompson too many doctors prescribing too many medications. When he calls [facility] for refills, they tell him he needs an appt, but then the patient is notified of refills at his pharmacy so he cancels the appt. He states that if the doctors don't refill his medications, then they aren't that important.   He Thompson been out of brilinta for several days. He Thompson been complaint on BB, spiro, and 20 mg lasix. He was maintained on coreg vs torpol due to cocaine use.   He also reported some intermittent palpitations.   Past Medical History:  Diagnosis Date  . Acute renal failure ([place])    a. a. during 7-05/2018 adm for cardiac arrest, requiring HD temporarily.  . Alcohol abuse   . Anemia   . AVM (arteriovenous malformation) of colon   . Cardiac arrest ([place]) 05/22/2018   Vfib  . CHF (congestive heart failure) ([place])   .  Cocaine abuse ([place])   . COPD (chronic obstructive pulmonary disease) ([place])   . Coronary artery disease    a. hx of POBA in [place]. b. out-of-hospital cardiac arrest 04/2018 (vfib/STEMI) s/p DES to prox LAD (complex admission with GIB, renal failure requiring HD, encephalopathy). c. STEMI 11/2018 after pt stopped Plavix - s/p DES to LAD.  Marland Kitchen Duodenitis   . GI bleed   . History of blood transfusion   . Hyperlipidemia   .  Hypokalemia   . Hypomagnesemia   . Iron deficiency anemia   . Ischemic cardiomyopathy    a. EF <25% at time of cath/arrest 04/2018, improved to 40-45% by f/u echo 06/09/18.  . Myasthenia gravis ([place])   . Myocardial infarction ([place]) 05/22/2018   STEMI  . PAD (peripheral artery disease) ([place])    a. femoral bypass  . Shock liver    a. during 7-05/2018 adm for cardiac arrest and again in 11/2018.  Marland Kitchen Thrombocytopenia ([place]) 04/2018  . Tobacco abuse   . Ventricular fibrillation [facility])     Past Surgical History:  Procedure Laterality Date  . ATHERECTOMY    . BIOPSY  07/16/2018   Procedure: BIOPSY;  Surgeon: Gatha Mayer, MD;  Location: [facility] ENDOSCOPY;  Service: Endoscopy;;  . CARDIAC CATHETERIZATION  05/22/2018   stent  . COLONOSCOPY WITH PROPOFOL N/A 07/16/2018   Procedure: COLONOSCOPY WITH PROPOFOL;  Surgeon: Gatha Mayer, MD;  Location: [facility] ENDOSCOPY;  Service: Endoscopy;  Laterality: N/A;  . CORONARY ANGIOPLASTY    . CORONARY STENT INTERVENTION N/A 05/22/2018   Procedure: CORONARY STENT INTERVENTION;  Surgeon: Lorretta Harp, MD;  Location: [place] CV LAB;  Service: Cardiovascular;  Laterality: N/A;  . CORONARY/GRAFT ACUTE MI REVASCULARIZATION N/A 12/16/2018   Procedure: CORONARY/GRAFT ACUTE MI REVASCULARIZATION;  Surgeon: Troy Sine, MD;  Location: [place] CV LAB;  Service: Cardiovascular;  Laterality: N/A;  . ENTEROSCOPY N/A 01/22/2019   Procedure: ENTEROSCOPY;  Surgeon: Thornton Park, MD;  Location: [place];  Service: Gastroenterology;  Laterality: N/A;  . ESOPHAGOGASTRODUODENOSCOPY (EGD) WITH PROPOFOL N/A 06/10/2018   Procedure: ESOPHAGOGASTRODUODENOSCOPY (EGD) WITH PROPOFOL;  Surgeon: Ladene Artist, MD;  Location: [facility] ENDOSCOPY;  Service: Endoscopy;  Laterality: N/A;  . ESOPHAGOGASTRODUODENOSCOPY (EGD) WITH PROPOFOL N/A 07/16/2018   Procedure: ESOPHAGOGASTRODUODENOSCOPY (EGD) WITH PROPOFOL;  Surgeon: Gatha Mayer, MD;  Location: [place];  Service:  Endoscopy;  Laterality: N/A;  . FEMORAL ARTERY - POPLITEAL ARTERY BYPASS GRAFT    . GIVENS CAPSULE STUDY N/A 01/20/2019   Procedure: GIVENS CAPSULE STUDY;  Surgeon: Gatha Mayer, MD;  Location: [facility] ENDOSCOPY;  Service: Endoscopy;  Laterality: N/A;  . HOT HEMOSTASIS N/A 07/16/2018   Procedure: HOT HEMOSTASIS (ARGON PLASMA COAGULATION/BICAP);  Surgeon: Gatha Mayer, MD;  Location: [facility] ENDOSCOPY;  Service: Endoscopy;  Laterality: N/A;  . HOT HEMOSTASIS N/A 01/22/2019   Procedure: HOT HEMOSTASIS (ARGON PLASMA COAGULATION/BICAP);  Surgeon: Thornton Park, MD;  Location: [place];  Service: Gastroenterology;  Laterality: N/A;  . IABP INSERTION N/A 12/16/2018   Procedure: IABP Insertion;  Surgeon: Troy Sine, MD;  Location: [place] CV LAB;  Service: Cardiovascular;  Laterality: N/A;  . IR FLUORO GUIDE CV LINE RIGHT  05/31/2018  . IR REMOVAL TUN CV CATH W/O FL  06/21/2018  . IR US GUIDE VASC ACCESS RIGHT  05/31/2018  . LEFT HEART CATH AND CORONARY ANGIOGRAPHY N/A 05/22/2018   Procedure: LEFT HEART CATH AND CORONARY ANGIOGRAPHY;  Surgeon: Lorretta Harp, MD;  Location: [place] CV LAB;  Service: Cardiovascular;  Laterality: N/A;  . LEFT HEART CATH AND CORONARY ANGIOGRAPHY N/A 12/16/2018   Procedure: LEFT HEART CATH AND CORONARY ANGIOGRAPHY;  Surgeon: Troy Sine, MD;  Location: [place] CV LAB;  Service: Cardiovascular;  Laterality: N/A;     Home Medications:  Prior to Admission medications   Medication Sig Start Date End Date Taking? Authorizing Provider  acetaminophen (TYLENOL) 325 MG tablet Take 650 mg by mouth every 6 (six) hours as needed for mild pain or moderate pain.   Yes [provider]  atorvastatin (LIPITOR) 40 MG tablet Take 40 mg by mouth daily. 02/20/19  Yes [provider]  carvedilol (COREG) 3.125 MG tablet Take 1 tablet (3.125 mg total) by mouth 2 (two) times daily with a meal. Please schedule annual appt for refills. [phone]. 1st attempt.  01/16/20  Yes Dunn, Dayna N, PA-C  furosemide (LASIX) 20 MG tablet Take 3 tablets (60 mg total) by mouth daily. Patient taking differently: Take 20 mg by mouth daily.  03/15/19  Yes Vann, Jessica U, DO  losartan (COZAAR) 25 MG tablet TAKE 1 TABLET BY MOUTH EVERY DAY Patient taking differently: Take 25 mg by mouth daily.  11/23/19  Yes Troy Sine, MD  multivitamin (RENA-VIT) TABS tablet Take 1 tablet by mouth at bedtime. 02/20/19  Yes [provider]  nitroGLYCERIN (NITROSTAT) 0.4 MG SL tablet Place 0.4 mg under the tongue every 5 (five) minutes as needed for chest pain (max 3 doses).    Yes [provider]  spironolactone (ALDACTONE) 25 MG tablet TAKE 1/2 TABLETS BY MOUTH EVERY DAY Patient taking differently: Take 25 mg by mouth daily.  10/17/19  Yes Dunn, Nedra Hai, PA-C  ticagrelor (BRILINTA) 90 MG TABS tablet Take 1 tablet (90 mg total) by mouth 2 (two) times daily. NEED OV. Patient taking differently: Take 90 mg by mouth 2 (two) times daily.  04/17/20  Yes Troy Sine, MD  buPROPion [facility] SR) 150 MG 12 hr tablet TAKE 1 TABLET BY MOUTH TWICE A DAY Patient not taking: Reported on 08/23/2020 09/20/19   Troy Sine, MD  folic acid (FOLVITE) 1 MG tablet TAKE 1 TABLET BY MOUTH EVERY DAY Patient not taking: Reported on 08/23/2020 06/22/19   Troy Sine, MD  Multiple Vitamin (MULTIVITAMIN WITH MINERALS) TABS tablet Take 1 tablet by mouth daily. Patient not taking: Reported on 03/14/2019 07/17/18   Hosie Poisson, MD  pantoprazole (PROTONIX) 40 MG tablet Take 1 tablet (40 mg total) by mouth 2 (two) times daily. Patient not taking: Reported on 08/23/2020 01/22/19 03/19/19  Rehman, Areeg N, DO  potassium chloride SA (K-DUR,KLOR-CON) 20 MEQ tablet Take 1 tablet (20 mEq total) by mouth daily. Patient not taking: Reported on 08/23/2020 01/19/19   Charlie Pitter, PA-C    Inpatient Medications: Scheduled Meds: . atorvastatin  40 mg Oral Daily  . carvedilol  3.125 mg Oral BID  WC  . furosemide  60 mg Intravenous BID  . potassium chloride  40 mEq Oral BID  . ramelteon  8 mg Oral QHS  . sodium chloride flush  3 mL Intravenous Q12H  . spironolactone  25 mg Oral Daily  . ticagrelor  90 mg Oral BID   Continuous Infusions: . sodium chloride    . magnesium sulfate bolus IVPB     PRN Meds: sodium chloride, acetaminophen, ondansetron (ZOFRAN) IV, sodium chloride flush  Allergies:   No Known Allergies  Social History:   Social History   Socioeconomic History  .  Marital status: Divorced    Spouse name: Not on file  . Number of children: Not on file  . Years of education: Not on file  . Highest education level: Not on file  Occupational History    Employer: [employer]  Tobacco Use  . Smoking status: Current Every Day Smoker    Packs/day: 0.50    Types: Cigarettes  . Smokeless tobacco: Current User  Vaping Use  . Vaping Use: Never used  Substance and Sexual Activity  . Alcohol use: Yes    Comment: none in past several weeks, but normally about 5 beers a week, endorses binge drinking at celebrations  . Drug use: Yes    Types: Cocaine, Marijuana, Methamphetamines, Benzodiazepines    Comment: vaguely discusses useage, but quickly changes subject  . Sexual activity: Yes    Partners: Female  Other Topics Concern  . Not on file  Social History Narrative   Divorced   Employed at [employer]   Polysubstance abuse   Social Determinants of Health   Financial Resource Strain:   . Difficulty of Paying Living Expenses: Not on file  Food Insecurity:   . Worried About Charity fundraiser in the Last Year: Not on file  . Ran Out of Food in the Last Year: Not on file  Transportation Needs:   . Lack of Transportation (Medical): Not on file  . Lack of Transportation (Non-Medical): Not on file  Physical Activity:   . Days of Exercise per Week: Not on file  . Minutes of Exercise per Session: Not on file  Stress:   . Feeling of  Stress : Not on file  Social Connections:   . Frequency of Communication with Friends and Family: Not on file  . Frequency of Social Gatherings with Friends and Family: Not on file  . Attends Religious Services: Not on file  . Active Member of Clubs or Organizations: Not on file  . Attends Archivist Meetings: Not on file  . Marital Status: Not on file  Intimate Partner Violence:   . Fear of Current or Ex-Partner: Not on file  . Emotionally Abused: Not on file  . Physically Abused: Not on file  . Sexually Abused: Not on file    Family History:    Family History  Problem Relation Age of Onset  . Alzheimer's disease Father      ROS:  Please see the history of present illness.   All other ROS reviewed and negative.     Physical Exam/Data:   Vitals:   08/24/20 0802 08/24/20 0852 08/24/20 1000 08/24/20 1101  BP: 124/84  101/75 100/78  Pulse: 100  83 74  Resp:   20 17  Temp:  (!) 97.5 F (36.4 C)  97.8 F (36.6 C)  TempSrc:  Oral  Oral  SpO2:   100% 100%  Weight:      Height:        Intake/Output Summary (Last 24 hours) at 08/24/2020 1439 Last data filed at 08/24/2020 1300 Gross per 24 hour  Intake 180 ml  Output 6500 ml  Net -6320 ml   Last 3 Weights 08/23/2020 03/15/2019 03/14/2019  Weight (lbs) 160 lb 4.4 oz 160 lb 3.2 oz 162 lb  Weight (kg) 72.7 kg 72.666 kg 73.483 kg  Some encounter information is confidential and restricted. Go to Review Flowsheets activity to see all data.     Body mass index is 23.67 kg/m.  General:  Well nourished,  well developed, in no acute distress Neck: mild JVD Vascular: No carotid bruits  Cardiac:  normal S1, S2; RRR, 4/6 murmur heard throughout Lungs:  Respirations unlabored, right sided crackles Abd: soft, nontender, no hepatomegaly  Ext: no edema Musculoskeletal:  No deformities, BUE and BLE strength normal and equal Skin: warm and dry  Neuro:  CNs 2-12 intact, no focal abnormalities noted Psych:  Normal affect    EKG:  The EKG was personally reviewed and demonstrates:  Sinus tachycardia HR 116 Telemetry:  Telemetry was personally reviewed and demonstrates:  Sinus rhythm with HR 80-90s  Relevant CV Studies:  Echo 08/24/20: 1. EF 25-30% with RWMA consistent with prior LAD infarction. The LV is  moderately dilated compared with prior. MR is now severe. Mild to moderate  AI with mild AS is present. AoV not well visualized.  2. The mid to apical anterior segments are akinetic. The apex is  akinetic. The mid to distal septum is akinetic. Findings are consistent  with prior LAD infarction. Left ventricular ejection fraction, by  estimation, is 25 to 30%. The left ventricle Thompson  severely decreased function. The left ventricle demonstrates regional wall  motion abnormalities (see scoring diagram/findings for description). The  left ventricular internal cavity size was moderately dilated. Left  ventricular diastolic function could  not be evaluated.  3. Restricted PMVL in systole due to DCM (IIIB). 2D ERO 0.20 cm2 with  Rvol 27 cc. This is grossly underestimated due to oval shape of ERO. The  mitral valve is grossly normal. Severe mitral valve regurgitation.  4. Right ventricular systolic function is normal. The right ventricular  size is mildly enlarged. There is mildly elevated pulmonary artery  systolic pressure. The estimated right ventricular systolic pressure is  15.0 mmHg.  5. Left atrial size was severely dilated.  6. Right atrial size was mildly dilated.  7. The aortic valve was not well visualized. Aortic valve regurgitation  is mild to moderate. Mild aortic valve stenosis. Aortic valve mean  gradient measures 13.0 mmHg. Aortic valve Vmax measures 2.35 m/s.  8. The inferior vena cava is normal in size with greater than 50%  respiratory variability, suggesting right atrial pressure of 3 mmHg.    Coronary intervention, IABP insertion 11/2018:  Ost LAD to Prox LAD lesion is 100%  stenosed.  Post intervention, there is a 0% residual stenosis.  A stent was successfully placed.   Acute/Subacute thrombotic occlusion of the previously placed  3.0 x 20 mm Synergy  stent in the proximal LAD with clot extending almost to the LAD ostium with chest pain duration of at least 30 hours.  There was extensive recalcitrant thrombotic burden which persisted despite Angiomax, Aggrastat, multiple runs of Pronto thrombectomy, intracoronary nitroglycerin, intracoronary adenosine, distal infusion of adenosine, with ultimate complete resolution after insertion of an intra-aortic balloon pump for element of early cardiogenic shock and successful DES stenting of the LAD extending from the ostium to the mid vessel with insertion of a 3.5 x 38 mm Resolute stent postdilated to 3.75 mm with the 100% occlusion being reduced to 0% and after 3-1/2-hour procedure, ultimate reestablishment of brisk TIMI-3 flow antegrade down the LAD system which extends around the LV apex.  Normal left circumflex coronary artery.  Normal dominant RCA with initial faint collateralization of the LAD system via the distal RCA.  LVEDP at the start of the procedure was 30 mmHg.  Aggressive diuresis following Lasix 40 mg with ultimate 2.8 L of urine output during the procedure.  RECOMMENDATION: Continue Aggrastat for minimum of 18 hours post procedure.  Bivalirudin will be discontinued 2 hours post procedure.  We will plan to keep the intra-aortic balloon pump one-to-one today.  Obtain follow-up 2D echo Doppler study.  Recommend long-term dual antiplatelet therapy as long as there is no recurrent GI bleeds.  Will assess drug screen with remote history of cocaine use.  Smoking cessation is essential.  Aggressive lipid-lowering therapy with target LDL less than 70.   Laboratory Data:  High Sensitivity Troponin:   Recent Labs  Lab 08/23/20 1233 08/23/20 1429  TROPONINIHS 17 15     Chemistry Recent Labs  Lab  08/23/20 1233 08/24/20 0216  NA 137 137  K 4.2 3.1*  CL 105 102  CO2 21* 25  GLUCOSE 154* 134*  BUN 26* 24*  CREATININE 1.39* 1.26*  CALCIUM 9.4 8.9  GFRNONAA >60 >60  ANIONGAP 11 10    Recent Labs  Lab 08/23/20 1351 08/24/20 0216  PROT 6.6 5.7*  ALBUMIN 3.4* 2.8*  AST 110* 76*  ALT 134* 111*  ALKPHOS 132* 114  BILITOT 1.4* 1.1   Hematology Recent Labs  Lab 08/23/20 1233 08/24/20 0216  WBC 7.1 7.2  RBC 5.04 4.50  HGB 9.9* 8.9*  HCT 36.0* 31.0*  MCV 71.4* 68.9*  MCH 19.6* 19.8*  MCHC 27.5* 28.7*  RDW 22.2* 21.5*  PLT 369 307   BNP Recent Labs  Lab 08/23/20 1342  BNP 1,451.1*    DDimer No results for input(s): DDIMER in the last 168 hours.   Radiology/Studies:  DG Chest 2 View  Result Date: 08/23/2020 CLINICAL DATA:  Chest pain. EXAM: CHEST - 2 VIEW COMPARISON:  Mar 14, 2019. FINDINGS: Stable cardiomegaly is noted with mild central pulmonary vascular congestion. Mild bilateral perihilar edema may be present. Sternotomy wires are noted. No pneumothorax or pleural effusion is noted. Bony thorax is unremarkable. IMPRESSION: Stable cardiomegaly with mild central pulmonary vascular congestion. Mild bilateral perihilar edema may be present. Electronically Signed   By: Marijo Conception M.D.   On: 08/23/2020 13:08   CT Angio Chest PE W and/or Wo Contrast  Result Date: 08/23/2020 CLINICAL DATA:  Short of breath and weakness 1 week. Rule out pulmonary embolism. EXAM: CT ANGIOGRAPHY CHEST WITH CONTRAST TECHNIQUE: Multidetector CT imaging of the chest was performed using the standard protocol during bolus administration of intravenous contrast. Multiplanar CT image reconstructions and MIPs were obtained to evaluate the vascular anatomy. CONTRAST:  16m OMNIPAQUE IOHEXOL 350 MG/ML SOLN COMPARISON:  Chest x-ray 08/23/2020 FINDINGS: Cardiovascular: Cardiac enlargement with left ventricular enlargement. Left coronary stent. Median sternotomy. No pericardial effusion. Pulmonary  arteries normal in caliber. Negative for pulmonary embolism. Mild atherosclerotic calcification aortic arch without aneurysm. Lack of opacification of the aorta precludes evaluation for dissection. Mediastinum/Nodes: 23 mm calcified left lower hilar lymph node. 16 mm noncalcified right hilar lymph node. No mediastinal mass. Lungs/Pleura: Small bilateral pleural effusions. Mild emphysema most prominent the apices. 4 mm calcified granuloma in the lingula. No acute infiltrate or mass lesion in the lungs. Upper Abdomen: No acute abnormality. Musculoskeletal: Mild thoracic degenerative changes. No acute skeletal abnormality. Review of the MIP images confirms the above findings. IMPRESSION: 1. Negative for pulmonary embolism. 2. Cardiac enlargement with left ventricular enlargement. Left coronary stent. 3. Small pleural effusions. 4. Apical emphysema. 5. Evidence of prior granulomatous disease with calcified granuloma in the lingula and calcified left hilar lymph node. Electronically Signed   By: CFranchot GalloM.D.   On: 08/23/2020 16:52  ECHOCARDIOGRAM COMPLETE  Result Date: 08/24/2020    ECHOCARDIOGRAM REPORT   Patient Name:   Howard Thompson Date of Exam: 08/24/2020 Medical Rec #:  [id number]    Height:       69.0 in Accession #:    [id number]   Weight:       160.3 lb Date of Birth:  December 05, 1965    BSA:          1.880 m Patient Age:    73 years     BP:           124/84 mmHg Patient Gender: M            HR:           88 bpm. Exam Location:  Inpatient Procedure: 2D Echo, Cardiac Doppler and Color Doppler Indications:    Dyspnea 786.09 / R06.00  History:        Patient Thompson prior history of Echocardiogram examinations, most                 recent 12/16/2018. CHF and Cardiomyopathy, CAD and Previous                 Myocardial Infarction, PAD and COPD; Risk Factors:Hypertension,                 Dyslipidemia and Current Smoker. Cardiac Arrest.  Sonographer:    Tiffany Dance Referring Phys: [id number] ALEXANDER N RAINES  IMPRESSIONS  1. EF 25-30% with RWMA consistent with prior LAD infarction. The LV is moderately dilated compared with prior. MR is now severe. Mild to moderate AI with mild AS is present. AoV not well visualized.  2. The mid to apical anterior segments are akinetic. The apex is akinetic. The mid to distal septum is akinetic. Findings are consistent with prior LAD infarction. Left ventricular ejection fraction, by estimation, is 25 to 30%. The left ventricle Thompson severely decreased function. The left ventricle demonstrates regional wall motion abnormalities (see scoring diagram/findings for description). The left ventricular internal cavity size was moderately dilated. Left ventricular diastolic function could not be evaluated.  3. Restricted PMVL in systole due to DCM (IIIB). 2D ERO 0.20 cm2 with Rvol 27 cc. This is grossly underestimated due to oval shape of ERO. The mitral valve is grossly normal. Severe mitral valve regurgitation.  4. Right ventricular systolic function is normal. The right ventricular size is mildly enlarged. There is mildly elevated pulmonary artery systolic pressure. The estimated right ventricular systolic pressure is 70.2 mmHg.  5. Left atrial size was severely dilated.  6. Right atrial size was mildly dilated.  7. The aortic valve was not well visualized. Aortic valve regurgitation is mild to moderate. Mild aortic valve stenosis. Aortic valve mean gradient measures 13.0 mmHg. Aortic valve Vmax measures 2.35 m/s.  8. The inferior vena cava is normal in size with greater than 50% respiratory variability, suggesting right atrial pressure of 3 mmHg. Comparison(s): Changes from prior study are noted. Conclusion(s)/Recommendation(s): Findings consistent with ischemic cardiomyopathy. FINDINGS  Left Ventricle: The mid to apical anterior segments are akinetic. The apex is akinetic. The mid to distal septum is akinetic. Findings are consistent with prior LAD infarction. Left ventricular ejection  fraction, by estimation, is 25 to 30%. The left ventricle Thompson severely decreased function. The left ventricle demonstrates regional wall motion abnormalities. The left ventricular internal cavity size was moderately dilated. There is no left ventricular hypertrophy. Left ventricular diastolic function  could not be evaluated due to mitral regurgitation (moderate  or greater). Left ventricular diastolic function could not be evaluated.  LV Wall Scoring: The mid and distal anterior wall, mid and distal anterior septum, apical lateral segment, mid inferoseptal segment, and apex are akinetic. Right Ventricle: The right ventricular size is mildly enlarged. No increase in right ventricular wall thickness. Right ventricular systolic function is normal. There is mildly elevated pulmonary artery systolic pressure. The tricuspid regurgitant velocity is 3.17 m/s, and with an assumed right atrial pressure of 3 mmHg, the estimated right ventricular systolic pressure is 09.8 mmHg. Left Atrium: Left atrial size was severely dilated. Right Atrium: Right atrial size was mildly dilated. Pericardium: Trivial pericardial effusion is present. Mitral Valve: Restricted PMVL in systole due to DCM (IIIB). 2D ERO 0.20 cm2 with Rvol 27 cc. This is grossly underestimated due to oval shape of ERO. The mitral valve is grossly normal. Severe mitral valve regurgitation. Tricuspid Valve: The tricuspid valve is grossly normal. Tricuspid valve regurgitation is mild . No evidence of tricuspid stenosis. Aortic Valve: The aortic valve was not well visualized. Aortic valve regurgitation is mild to moderate. Aortic regurgitation PHT measures 414 msec. Mild aortic stenosis is present. Aortic valve mean gradient measures 13.0 mmHg. Aortic valve peak gradient  measures 22.1 mmHg. Aortic valve area, by VTI measures 1.34 cm. Pulmonic Valve: The pulmonic valve was grossly normal. Pulmonic valve regurgitation is mild. No evidence of pulmonic stenosis. Aorta: The  aortic root and ascending aorta are structurally normal, with no evidence of dilitation. Venous: The inferior vena cava is normal in size with greater than 50% respiratory variability, suggesting right atrial pressure of 3 mmHg. IAS/Shunts: The atrial septum is grossly normal.  LEFT VENTRICLE PLAX 2D LVIDd:         6.20 cm LVIDs:         5.20 cm LV PW:         1.30 cm LV IVS:        1.00 cm LVOT diam:     2.10 cm LV SV:         52 LV SV Index:   28 LVOT Area:     3.46 cm  RIGHT VENTRICLE             IVC RV Basal diam:  3.10 cm     IVC diam: 2.00 cm RV Mid diam:    2.30 cm RV S prime:     12.30 cm/s TAPSE (M-mode): 2.2 cm LEFT ATRIUM              Index       RIGHT ATRIUM           Index LA diam:        4.80 cm  2.55 cm/m  RA Area:     24.60 cm LA Vol (A2C):   126.0 ml 67.01 ml/m RA Volume:   86.15 ml  45.81 ml/m LA Vol (A4C):   96.5 ml  51.32 ml/m LA Biplane Vol: 113.0 ml 60.09 ml/m  AORTIC VALVE AV Area (Vmax):    1.16 cm AV Area (Vmean):   1.09 cm AV Area (VTI):     1.34 cm AV Vmax:           235.00 cm/s AV Vmean:          171.000 cm/s AV VTI:            0.388 m AV Peak Grad:      22.1 mmHg AV Mean Grad:      13.0 mmHg LVOT  Vmax:         78.80 cm/s LVOT Vmean:        53.650 cm/s LVOT VTI:          0.151 m LVOT/AV VTI ratio: 0.39 AI PHT:            414 msec  AORTA Ao Root diam: 3.20 cm Ao Asc diam:  3.10 cm MITRAL VALVE                 TRICUSPID VALVE MV Area (PHT): 2.90 cm      TR Peak grad:   40.2 mmHg MV Decel Time: 262 msec      TR Vmax:        317.00 cm/s MR Peak grad:    82.4 mmHg MR Mean grad:    61.0 mmHg   SHUNTS MR Vmax:         454.00 cm/s Systemic VTI:  0.15 m MR Vmean:        370.0 cm/s  Systemic Diam: 2.10 cm MR PISA:         2.26 cm MR PISA Eff ROA: 20 mm MR PISA Radius:  0.60 cm MV E velocity: 122.00 cm/s MV A velocity: 42.30 cm/s MV E/A ratio:  2.88 Eleonore Chiquito MD Electronically signed by Eleonore Chiquito MD Signature Date/Time: 08/24/2020/1:09:15 PM    Final      Assessment and  Plan:   Acute on chronic systolic heart failure Severe MR - EF this admission is stable 25-30% with severe MR, severely dilated left atrium, mild AS - pt was only taking 20 mg lasix at admission, 60 mg listed in home meds - was on a good GDMT including BB, ARB, spiro, and lasix - he reports compliance - question if "sinus infection" precipitated heart failure exacerbation, does not appear ischemic - sounds like he Thompson compliance issues - weight 160 lbs, he doesn't weigh at home - he Thompson received the equivalent of 60 mg IV BID and diuresed very well: Net negative 7L with 4.1 L urine output yesterday --> start 40 mg PO lasix tomorrow morning - continue daily weights and I&Os - continue 3.125 mg coreg BID, 25 mg spironolactone, add 40 mg lasix tomorrow morning - if pressure allows, add low dose losartan tomorrow    CAD s/p STEMI x 2 - [name] a history of stent thrombosis and GI bleeding - difficult situation given his anemia - [name] been on monotherapy with brilinta - CE negative, no chest pain - pt was on ASA/brilinta after first STEMI --> GI bleed --> ASA and plavix --> pt self-discontinued plavix --> second STEMI with thrombotic occlusion --> placed on ASA/brilinta --> GI bleed --> brilinta monotherapy - [name] not taken brilinta in several days to a week - we recommend plavix monotherapy - start 75 mg plavix tomorrow morning and monitor for bleeding   Hypertension - medications above   Hyperlipidemia with LDL goal < 70 - collect updated lipid profile tomorrow morning - continue 40 mg lipitor   Polysubstance abuse - avoid smoking and cocaine   New York Heart Association (NYHA) Functional Class NYHA Class I        For questions or updates, please contact [facility] Please consult [email or web address] for contact info under    Signed, Ledora Bottcher, [place]  08/24/2020 2:39 PM

## 2020-08-24 NOTE — Progress Notes (Addendum)
Subjective:  Patient complained of difficulty sleeping over night. He said he wakes up feeling completely out of breath. He admitted to having OSA, and does not feel the use of a CPAP helps him. He said his symptoms were worse when he was younger. He doesn't think his OSA is the cause of his current symptoms. He reported DOE, orthopnea, and PND that has been getting worse the past few weeks. He thinks that his current episode of exacerbation could have been a result of a recent sinus infection which he self-treated with Augmentin. He said he started coughing, and developed green-color sputum, and could feel the mucus drain into his lungs. He also said that his worsening symptoms could be due to medication issues. He reports having multiple healthcare providers that supply different prescriptions and it is difficult to arrange the refills on a timely basis. He doesn't feel that he is extremely fluid-overloaded at the moment as he normally would see swelling around his ankles. He is interested in getting his COVID-19 booster while in the hospital if possible. He previously received the [employer] vaccine.  Objective:  Vital signs in last 24 hours: Vitals:   08/24/20 0802 08/24/20 0852 08/24/20 1000 08/24/20 1101  BP: 124/84  101/75 100/78  Pulse: 100  83 74  Resp:   20 17  Temp:  (!) 97.5 F (36.4 C)  97.8 F (36.6 C)  TempSrc:  Oral  Oral  SpO2:   100% 100%  Weight:      Height:       Weight change:   Intake/Output Summary (Last 24 hours) at 08/24/2020 1422 Last data filed at 08/24/2020 1300 Gross per 24 hour  Intake 180 ml  Output 6500 ml  Net -6320 ml   Physical Exam Vitals and nursing note reviewed.  Constitutional:      Appearance: He is well-developed.  HENT:     Head: Normocephalic and atraumatic.  Eyes:     Extraocular Movements: Extraocular movements intact.     Pupils: Pupils are equal, round, and reactive to light.  Neck:     Vascular: JVD present.    Cardiovascular:     Rate and Rhythm: Normal rate and regular rhythm.     Pulses: Normal pulses.     Heart sounds: Normal heart sounds, S1 normal and S2 normal. No murmur heard.  No friction rub. No gallop.   Pulmonary:     Effort: Pulmonary effort is normal.     Breath sounds: Normal breath sounds.     Comments: Patient breaths better when sitting upright.  Chest:     Chest wall: No mass, tenderness or crepitus.  Abdominal:     General: Bowel sounds are normal. There is no distension.  Musculoskeletal:     Right lower leg: No edema.     Left lower leg: No edema.  Neurological:     General: No focal deficit present.     Mental Status: He is alert and oriented to person, place, and time.  Psychiatric:        Attention and Perception: Attention and perception normal.        Mood and Affect: Mood normal.        Speech: Speech normal.        Behavior: Behavior normal. Behavior is cooperative.        Thought Content: Thought content normal.      Assessment/Plan:  Principal Problem:   Acute decompensated heart failure (HCC) Active Problems:  Ischemic cardiomyopathy   Iron deficiency anemia   CAD in native artery   AVM (arteriovenous malformation) of small bowel, acquired with hemorrhage   Essential hypertension   Abnormal LFTs  Cartier Washko is a 54 y.o. male with a PMHx of ischemic cardiomyopathy with HFrEF (LVEF 25-30%), MI with DES in LAD in 2019 and in-stent thrombosis in 2020, PAD s/p left femoral bypass, COPD, myasthenia gravis s/p thymectomy, OSA, and colonic AVMs with recurrent bleeds presents to [facility] with c/o progressively worsening DOE, orthopnea, and PND over the past few weeks.   HFrEF exacerbation Ischemic cardiomyopathy Presents with several weeks of DOE, orthopnea, fatigue. Endorses medication non-adherence due to difficulty getting refills. Home meds of Coreg 3.125mg  BID, losartan 25mg  daily, Lasix 60mg  daily, spironolactone 12.5mg  daily, and Klor-con 71mEq.  Last echo in 11/2018 with EF 25-30%, severe hypokinesis of L ventricular anteroseptal wall. Today's echo on 08/24/2020 shows EF 25-30% with severe MR and slightly elevated RV systolic pressures.  -Consulted Cardiology and will f/u with recommendations  -Continue Coreg 3.125mg  BID with meals -Continue Lasix 60mg  IV BID -Continue spironolactone 25mg  daily -Continue atorvastatin 40mg  daily -Continue ticagrelor 90mg  BID -Daily CMP, monitor electrolytes -Gave Kcl and Mg for electrolyte repletion -F/u with K and Mg in AM -Monitor urine output   AKI Today's BMP shows improvement in Cr since admission (1.4 to 1.26). Typically has normal renal function, baseline Cr serum is 1.0. -Continue Lasix 60mg  IV BID -Hold home losartan 25mg  -Urine Creatinine is WNL (48.46) -f/u urine urea (pending) -daily CMP, monitor renal function   Iron deficiency anemia 2/2 AVMs in colon In March 2020 had endoscopy and enteroscopy by [facility] demonstrating several AVMs in the small bowel, the largest of which was treated with argon plasma coagulation. Last iron labs in March 2020 with Fe of 8, ferritin 14. Hemoglobin is stable at 8.9 and Hct is 31.0. -Iron panel: Fe of 24, ferritin of 18 -Give IV Feraheme before discharge -Monitor H/H -daily CBC    CAD s/p STEMI x2 and DES to LAD PAD s/p L femoral bypass Patient has a history of MI with DES in LAD in 2019 and in-stent thrombosis in 2020, and PAD s/p left femoral bypass. He denies any chest pain. EKG on admission showed sinus tachycardia, no ST changes, and R axis deviation. Troponin x2 were negative (17 > 15). -Continue ticagrelor 90 mg BID -Continue atorvastatin 40 mg daily   Essential HTN Patient is managed with home medications.  -Continue Coreg 3.125mg  BID -Hold Losartan (for AKI) -Continue Lasix 60mg  IV BID -Continue spironolactone 12.5mg  daily   Abnormal LFTs AST/ALT have improved since admission: 110/134 -> 76/111. Patient has a history of  alcohol use, shocked liver in 2019 which resolved, negative Hep B and C testing in 2019. Elevated LFTs with unknown etiology, we will continue to monitor. -ETOH level WNL (<10) -F/u with CMP in AM   LOS: 1 day   Lytle Michaels, Medical Student 08/24/2020, 2:22 PM

## 2020-08-24 NOTE — Progress Notes (Signed)
Heart Failure Stewardship Pharmacist Progress Note   PCP: Jamesetta Geralds, MD PCP-Cardiologist: Shelva Majestic, MD    HPI:  54 yo M with PMH of CAD s/p vfib arrest/STEMI in 04/2018 and another STEMI in 11/2018, HFrEF with EF 25-30%, PAD, COPD, HLD, myasthenia gravis s/p thymectomy, AVM, iron deficiency anemia, and polysubstance use disorder. He presented to the ED On 08/23/20 with shortness of breath, DOE, orthopnea, and fatigue. He has been admitted for HF exacerbation. A repeat ECHO was done 08/24/20 and his LVEF remains reduced at 25-30%  Current HF Medications: Furosemide 60 mg IV BID Carvedilol 3.125 mg BID Spironolactone 25 mg daily  Prior to admission HF Medications: Furosemide 20 mg daily Carvedilol 3.125 mg BID Losartan 25 mg daily Spironolactone 25 mg daily  Pertinent Lab Values:  Serum creatinine 1.39>1.26, BUN 24, Potassium 3.1, Sodium 137, BNP 1451.1, Magnesium 1.7   Vital Signs:  Weight: 160 lbs (admission weight: 160 lbs)  Blood pressure: 100-120/80s   Heart rate: 80-90s   Medication Assistance / Insurance Benefits Check: Does the patient have prescription insurance?  Yes Type of insurance plan: Commercial insurance - UHC  Outpatient Pharmacy:  Prior to admission [facility] Is the patient willing to use [facility] at discharge? Yes Is the patient willing to transition their outpatient pharmacy to utilize a [facility] outpatient pharmacy?   Pending   Assessment: 1. Acute on chronic systolic CHF (EF 75-17%), due to ischemic cardiomyopathy. NYHA class III symptoms. - Continue furosemide 60 mg IV BID. Potassium and magnesium replaced. - Continue carvedilol 3.125 mg BID - Losartan is on hold. Consider starting Entresto if BP tolerates. - Continue spironolactone 25 mg daily - Consider starting Farxiga prior to discharge   Plan: 1) Medication changes recommended at this time: - None at this time - continue diuresis and can attempt to  optimize HF regimen with [name] and Farxiga as BP allows  2)  Education  - To be completed prior to discharge  Kerby Nora, PharmD, BCPS Heart Failure Stewardship Pharmacist Phone [phone]

## 2020-08-24 NOTE — Progress Notes (Signed)
  Echocardiogram 2D Echocardiogram has been performed.  Howard Thompson 08/24/2020, 10:30 AM

## 2020-08-25 DIAGNOSIS — I2510 Atherosclerotic heart disease of native coronary artery without angina pectoris: Secondary | ICD-10-CM

## 2020-08-25 DIAGNOSIS — I509 Heart failure, unspecified: Secondary | ICD-10-CM

## 2020-08-25 DIAGNOSIS — I340 Nonrheumatic mitral (valve) insufficiency: Secondary | ICD-10-CM

## 2020-08-25 LAB — COMPREHENSIVE METABOLIC PANEL
ALT: 98 U/L — ABNORMAL HIGH (ref 0–44)
AST: 58 U/L — ABNORMAL HIGH (ref 15–41)
Albumin: 3.4 g/dL — ABNORMAL LOW (ref 3.5–5.0)
Alkaline Phosphatase: 123 U/L (ref 38–126)
Anion gap: 10 (ref 5–15)
BUN: 26 mg/dL — ABNORMAL HIGH (ref 6–20)
CO2: 27 mmol/L (ref 22–32)
Calcium: 9.3 mg/dL (ref 8.9–10.3)
Chloride: 101 mmol/L (ref 98–111)
Creatinine, Ser: 1.3 mg/dL — ABNORMAL HIGH (ref 0.61–1.24)
GFR, Estimated: >60 mL/min (ref >60)
Glucose, Bld: 103 mg/dL — ABNORMAL HIGH (ref 70–99)
Potassium: 4.2 mmol/L (ref 3.5–5.1)
Sodium: 138 mmol/L (ref 135–145)
Total Bilirubin: 1.9 mg/dL — ABNORMAL HIGH (ref 0.3–1.2)
Total Protein: 6.6 g/dL (ref 6.5–8.1)

## 2020-08-25 LAB — CBC WITH DIFFERENTIAL/PLATELET
Abs Immature Granulocytes: 0.02 10*3/uL (ref 0.00–0.07)
Basophils Absolute: 0.1 10*3/uL (ref 0.0–0.1)
Basophils Relative: 1 %
Eosinophils Absolute: 0.3 10*3/uL (ref 0.0–0.5)
Eosinophils Relative: 4 %
HCT: 35.4 % — ABNORMAL LOW (ref 39.0–52.0)
Hemoglobin: 10 g/dL — ABNORMAL LOW (ref 13.0–17.0)
Immature Granulocytes: 0 %
Lymphocytes Relative: 18 %
Lymphs Abs: 1.4 10*3/uL (ref 0.7–4.0)
MCH: 19.6 pg — ABNORMAL LOW (ref 26.0–34.0)
MCHC: 28.2 g/dL — ABNORMAL LOW (ref 30.0–36.0)
MCV: 69.4 fL — ABNORMAL LOW (ref 80.0–100.0)
Monocytes Absolute: 0.8 10*3/uL (ref 0.1–1.0)
Monocytes Relative: 11 %
Neutro Abs: 4.9 10*3/uL (ref 1.7–7.7)
Neutrophils Relative %: 66 %
Platelets: 362 10*3/uL (ref 150–400)
RBC: 5.1 MIL/uL (ref 4.22–5.81)
RDW: 21.9 % — ABNORMAL HIGH (ref 11.5–15.5)
WBC: 7.5 10*3/uL (ref 4.0–10.5)
nRBC: 0.4 % — ABNORMAL HIGH (ref 0.0–0.2)

## 2020-08-25 LAB — UREA NITROGEN, URINE: Urea Nitrogen, Ur: 430 mg/dL

## 2020-08-25 LAB — PHOSPHORUS: Phosphorus: 3.9 mg/dL (ref 2.5–4.6)

## 2020-08-25 LAB — MAGNESIUM: Magnesium: 2.1 mg/dL (ref 1.7–2.4)

## 2020-08-25 MED ORDER — CLOPIDOGREL BISULFATE 75 MG PO TABS
75.0000 mg | ORAL_TABLET | Freq: Every day | ORAL | 0 refills | Status: DC
Start: 2020-08-26 — End: 2021-02-13

## 2020-08-25 MED ORDER — FUROSEMIDE 40 MG PO TABS
40.0000 mg | ORAL_TABLET | Freq: Every day | ORAL | 0 refills | Status: DC
Start: 2020-08-25 — End: 2021-01-17

## 2020-08-25 MED ORDER — NITROGLYCERIN 0.4 MG SL SUBL: 0.4000 mg | SUBLINGUAL_TABLET | SUBLINGUAL | 0 refills | Status: AC | PRN

## 2020-08-25 MED ORDER — SPIRONOLACTONE 25 MG PO TABS
25.0000 mg | ORAL_TABLET | Freq: Every day | ORAL | 0 refills | Status: DC
Start: 2020-08-25 — End: 2021-01-17

## 2020-08-25 MED ORDER — ATORVASTATIN CALCIUM 40 MG PO TABS
40.0000 mg | ORAL_TABLET | Freq: Every day | ORAL | 0 refills | Status: DC
Start: 2020-08-25 — End: 2021-01-17

## 2020-08-25 MED ORDER — CARVEDILOL 3.125 MG PO TABS
3.1250 mg | ORAL_TABLET | Freq: Two times a day (BID) | ORAL | 0 refills | Status: DC
Start: 2020-08-25 — End: 2021-01-17

## 2020-08-25 NOTE — Discharge Instructions (Addendum)
Heart Failure, Self Care Heart failure is a serious condition. This sheet explains things you need to do to take care of yourself at home. To help you stay as healthy as possible, you may be asked to change your diet, take certain medicines, and make other changes in your life. Your doctor may also give you more specific instructions. If you have problems or questions, call your doctor. What are the risks? Having heart failure makes it more likely for you to have some problems. These problems can get worse if you do not take good care of yourself. Problems may include:  Blood clotting problems. This may cause a stroke.  Damage to the kidneys, liver, or lungs.  Abnormal heart rhythms. Supplies needed:  Scale for weighing yourself.  Blood pressure monitor.  Notebook.  Medicines. How to care for yourself when you have heart failure Medicines Take over-the-counter and prescription medicines only as told by your doctor. Take your medicines every day.  Do not stop taking your medicine unless your doctor tells you to do so.  Do not skip any medicines.  Get your prescriptions refilled before you run out of medicine. This is important. Eating and drinking   Eat heart-healthy foods. Talk with a diet specialist (dietitian) to create an eating plan.  Choose foods that: ? Have no trans fat. ? Are low in saturated fat and cholesterol.  Choose healthy foods, such as: ? Fresh or frozen fruits and vegetables. ? Fish. ? Low-fat (lean) meats. ? Legumes, such as beans, peas, and lentils. ? Fat-free or low-fat dairy products. ? Whole-grain foods. ? High-fiber foods.  Limit salt (sodium) if told by your doctor. Ask your diet specialist to tell you which seasonings are healthy for your heart.  Cook in healthy ways instead of frying. Healthy ways of cooking include roasting, grilling, broiling, baking, poaching, steaming, and stir-frying.  Limit how much fluid you drink, if told by your  doctor. Alcohol use  Do not drink alcohol if: ? Your doctor tells you not to drink. ? Your heart was damaged by alcohol, or you have very bad heart failure. ? You are pregnant, may be pregnant, or are planning to become pregnant.  If you drink alcohol: ? Limit how much you use to:  0-1 drink a day for women.  0-2 drinks a day for men. ? Be aware of how much alcohol is in your drink. In the [place] one drink equals one 12 oz bottle of beer (355 mL), one 5 oz glass of wine (148 mL), or one 1 oz glass of hard liquor (44 mL). Lifestyle   Do not use any products that contain nicotine or tobacco, such as cigarettes, e-cigarettes, and chewing tobacco. If you need help quitting, ask your doctor. ? Do not use nicotine gum or patches before talking to your doctor.  Do not use illegal drugs.  Lose weight if told by your doctor.  Do physical activity if told by your doctor. Talk to your doctor before you begin an exercise if: ? You are an older adult. ? You have very bad heart failure.  Learn to manage stress. If you need help, ask your doctor.  Get rehab (rehabilitation) to help you stay independent and to help with your quality of life.  Plan time to rest when you get tired. Check weight and blood pressure   Weigh yourself every day. This will help you to know if fluid is building up in your body. ? Weigh yourself every  morning after you pee (urinate) and before you eat breakfast. ? Wear the same amount of clothing each time. ? Write down your daily weight. Give your record to your doctor.  Check and write down your blood pressure as told by your doctor.  Check your pulse as told by your doctor. Dealing with very hot and very cold weather  If it is very hot: ? Avoid activities that take a lot of energy. ? Use air conditioning or fans, or find a cooler place. ? Avoid caffeine and alcohol. ? Wear clothing that is loose-fitting, lightweight, and light-colored.  If it is very  cold: ? Avoid activities that take a lot of energy. ? Layer your clothes. ? Wear mittens or gloves, a hat, and a scarf when you go outside. ? Avoid alcohol. Follow these instructions at home:  Stay up to date with shots (vaccines). Get pneumococcal and flu (influenza) shots.  Keep all follow-up visits as told by your doctor. This is important. Contact a doctor if:  You gain weight quickly.  You have increasing shortness of breath.  You cannot do your normal activities.  You get tired easily.  You cough a lot.  You don't feel like eating or feel like you may vomit (nauseous).  You become puffy (swell) in your hands, feet, ankles, or belly (abdomen).  You cannot sleep well because it is hard to breathe.  You feel like your heart is beating fast (palpitations).  You get dizzy when you stand up. Get help right away if:  You have trouble breathing.  You or someone else notices a change in your behavior, such as having trouble staying awake.  You have chest pain or discomfort.  You pass out (faint). These symptoms may be an emergency. Do not wait to see if the symptoms will go away. Get medical help right away. Call your local emergency services (911 in the [place] Do not drive yourself to the hospital. Summary  Heart failure is a serious condition. To care for yourself, you may have to change your diet, take medicines, and make other lifestyle changes.  Take your medicines every day. Do not stop taking them unless your doctor tells you to do so.  Eat heart-healthy foods, such as fresh or frozen fruits and vegetables, fish, lean meats, legumes, fat-free or low-fat dairy products, and whole-grain or high-fiber foods.  Ask your doctor if you can drink alcohol. You may have to stop alcohol use if you have very bad heart failure.  Contact your doctor if you gain weight quickly or feel that your heart is beating too fast. Get help right away if you pass out, or have chest pain  or trouble breathing. This information is not intended to replace advice given to you by your health care provider. Make sure you discuss any questions you have with your health care provider. Document Revised: 01/25/2019 Document Reviewed: 01/26/2019 Elsevier Patient Education  2020 [place] were treated for heart failure on this admission.  You were also found to have a condition called mitral valve regurgitation.  Dr. Prescott Gum recommend you following up with his outpatient clinic.  You also will need to follow-up with your cardiologist, Dr. Stanford Breed.  As always we recommend following up with your primary care physician to coordinate all of your care.  I have sent 1 month of prescriptions to your pharmacy.  I stopped the losartan as your blood pressure has been well controlled just on Coreg.  I  will write a discharge summary so your physicians will be aware of all the changes and they will be able to see this in the electronic medical record.  My Best, Dr. Court Joy

## 2020-08-25 NOTE — Discharge Summary (Signed)
Name: Rocio Wolak MRN: [id number] DOB: 09-20-1966 54 y.o. PCP: Jamesetta Geralds, MD  Date of Admission: 08/23/2020 12:21 PM Date of Discharge:  08/25/2020 Attending Physician: Oda Kilts, MD  Discharge Diagnosis: 1. HFrEF exacerbation 2. Severe Mitral Regurgitation   Discharge Medications: Allergies as of 08/25/2020   No Known Allergies      Medication List     STOP taking these medications    acetaminophen 325 MG tablet Commonly known as: TYLENOL   buPROPion 150 MG 12 hr tablet Commonly known as: WELLBUTRIN SR   folic acid 1 MG tablet Commonly known as: FOLVITE   losartan 25 MG tablet Commonly known as: COZAAR   multivitamin Tabs tablet   multivitamin with minerals Tabs tablet   pantoprazole 40 MG tablet Commonly known as: PROTONIX   potassium chloride SA 20 MEQ tablet Commonly known as: KLOR-CON   ticagrelor 90 MG Tabs tablet Commonly known as: Brilinta       TAKE these medications    atorvastatin 40 MG tablet Commonly known as: LIPITOR Take 1 tablet (40 mg total) by mouth daily.   carvedilol 3.125 MG tablet Commonly known as: COREG Take 1 tablet (3.125 mg total) by mouth 2 (two) times daily with a meal. Please schedule annual appt for refills. [phone]. 1st attempt.   clopidogrel 75 MG tablet Commonly known as: PLAVIX Take 1 tablet (75 mg total) by mouth daily. Start taking on: August 26, 2020   furosemide 40 MG tablet Commonly known as: LASIX Take 1 tablet (40 mg total) by mouth daily. What changed:   medication strength  how much to take   nitroGLYCERIN 0.4 MG SL tablet Commonly known as: NITROSTAT Place 1 tablet (0.4 mg total) under the tongue every 5 (five) minutes as needed for chest pain (max 3 doses).   spironolactone 25 MG tablet Commonly known as: ALDACTONE Take 1 tablet (25 mg total) by mouth daily. What changed: See the new instructions.        Disposition and follow-up:   Mr.Eshaan Heigl was  discharged from [facility] in Stable condition.  At the hospital follow up visit please address:  1.  HFrEF: Etiology secondary to noncompliance with medications, reports running out and he could not get refills in time.  Likely his severe mitral regurgitation is playing a significant role, as he did not appear volume overloaded.  Sent out on the following medication regimen: Coreg 3.125 twice daily, spironolactone 25 mg daily, p.o. Lasix 40 mg daily.  BP was too low to tolerate losartan, consider restarting if pressures allow. Consider adding SGLT2  Severe mitral regurgitation: Patient should be following up with Dr. Prescott Gum for workup and consideration of valve replacement.   Coronary artery disease status post STEMI x2 and DES to LAD: Patient was on Brilinta and ran out. During hospital admission cardiology recommended switching him to Plavix.   IDA: Iron stores are low. Continue PO iron supplementation and recheck CBC + iron studies in 6-8 weeks.   2.  Labs / imaging needed at time of follow-up: BMP , CBC  3.  Pending labs/ test needing follow-up:   Follow-up Appointments:  Follow-up Information     Lelon Perla, MD. Call in 1 week(s).   Specialty: Cardiology Why: Call and schedule appointment with Dr. Stanford Breed in 1 week Contact information: [address] [place] [address] [phone]         Ivin Poot, MD Follow up.  Specialty: Cardiothoracic Surgery Why: Call and schedule outpatient visit with Dr. Prescott Gum or one of his partners.   Contact information: [address] [place] [address] [phone]         Jamesetta Geralds, MD Follow up in 1 week(s).   Specialty: Family Medicine Why: Follow-up with primary care physician to coordinate care. Contact information: [address] [place] [address] [place] Hospital Course by problem list:  Problems  addressed:   Acute on Chronic HFrEF exacerbation (Ischemic cardiomyopathy): Patient presented to [facility] with complaints of several weeks of worsening DOE, orthopnea, fatigue due to volume overload.  His BNP level at admission was 1451, consistent with acute on chronic systolic heart failure. EKG on admission showed sinus tachycardia, no ST changes, and R axis deviation. Troponin x2 were negative (17 > 15). Echo on 08/24/2020 shows EF 25-30% with severe MR and slightly elevated RV systolic pressures. Patient was diuresed with IV Lasix 60 mg and responded well. Cardiology was consulted and recommended to transition to PO Lasix 40 mg and to continue home regimen including Coreg, spironolactone, lasix, and Lipitor. Patient is encouraged to continue follow up with [facility] in [place] with Dr. Stanford Breed. It was suggested that he begin St[facility] as an out patient if BP and creatinine levels allow for it. Additionally, our CV surgery group was consulted to discuss future options of valve repair/replacement.   CAD s/p STEMI x2 and DES to LAD: Patient has a history of MI with DES in LAD in 2019 and in-stent thrombosis in 2020, and PAD s/p left femoral bypass. He denies any chest pain throughout hospital admission. Cardiology recommended that he discontinue Brilinta, and begin plavix monotherapy (given history of IDA 2/2 to GI bleeding). He should also continue his other heart medications as discussed above. Patient is encouraged to be compliant with his home medications, and avoid any lifestyle risk factors (I.e. substance abuse) to help prevent further episodes of exacerbation.   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 -Iron panel: Fe of 24,  ferritin of 18  Discharge Vitals:   BP 100/78   Pulse 89   Temp 97.8 F (36.6 C) (Oral)   Resp 14   Ht 5\' 9"  (1.753 m)   Wt 66.3 kg   SpO2 100%   BMI 21.58 kg/m   Pertinent Labs, Studies, and Procedures:  CBC Latest Ref Rng & Units 08/25/2020 08/24/2020 08/23/2020  WBC 4.0 - 10.5 K/uL 7.5 7.2 7.1  Hemoglobin 13.0 - 17.0 g/dL 10.0(L) 8.9(L) 9.9(L)  Hematocrit 39 - 52 % 35.4(L) 31.0(L) 36.0(L)  Platelets 150 - 400 K/uL 362 307 369   BMP Latest Ref Rng & Units 08/25/2020 08/24/2020 08/23/2020  Glucose 70 - 99 mg/dL 103(H) 134(H) 154(H)  BUN 6 - 20 mg/dL 26(H) 24(H) 26(H)  Creatinine 0.61 - 1.24 mg/dL 1.30(H) 1.26(H) 1.39(H)  Sodium 135 - 145 mmol/L 138 137 137  Potassium 3.5 - 5.1 mmol/L 4.2 3.1(L) 4.2  Chloride 98 - 111 mmol/L 101 102 105  CO2 22 - 32 mmol/L 27 25 21(L)  Calcium 8.9 - 10.3 mg/dL 9.3 8.9 9.4   Imaging:  DG Chest 2 View  Result Date: 08/23/2020 CLINICAL DATA:  Chest pain. EXAM: CHEST - 2 VIEW COMPARISON:  Mar 14, 2019. FINDINGS: Stable cardiomegaly is noted with mild central pulmonary vascular congestion. Mild bilateral perihilar edema may be present. Sternotomy wires are noted. No pneumothorax or pleural effusion is noted. Bony thorax is unremarkable. IMPRESSION: Stable cardiomegaly with mild central pulmonary vascular congestion. Mild bilateral perihilar edema may be present. Electronically Signed   By: Marijo Conception M.D.   On: 08/23/2020 13:08   CT Angio Chest PE W and/or Wo Contrast  Result Date: 08/23/2020 CLINICAL DATA:  Short of breath and weakness 1 week. Rule out pulmonary embolism. EXAM: CT ANGIOGRAPHY CHEST WITH CONTRAST TECHNIQUE: Multidetector CT imaging of the chest was performed using the standard protocol during bolus administration of intravenous contrast. Multiplanar CT image reconstructions and MIPs were obtained to evaluate the vascular anatomy. CONTRAST:  68mL OMNIPAQUE IOHEXOL 350 MG/ML SOLN COMPARISON:  Chest x-ray 08/23/2020 FINDINGS:  Cardiovascular: Cardiac enlargement with left ventricular enlargement. Left coronary stent. Median sternotomy. No pericardial effusion. Pulmonary arteries normal in caliber. Negative for pulmonary embolism. Mild atherosclerotic calcification aortic arch without aneurysm. Lack of opacification of the aorta precludes evaluation for dissection. Mediastinum/Nodes: 23 mm calcified left lower hilar lymph node. 16 mm noncalcified right hilar lymph node. No mediastinal mass. Lungs/Pleura: Small bilateral pleural effusions. Mild emphysema most prominent the apices. 4 mm calcified granuloma in the lingula. No acute infiltrate or mass lesion in the lungs. Upper Abdomen: No acute abnormality. Musculoskeletal: Mild thoracic degenerative changes. No acute skeletal abnormality. Review of the MIP images confirms the above findings. IMPRESSION: 1. Negative for pulmonary embolism. 2. Cardiac enlargement with left ventricular enlargement. Left coronary stent. 3. Small pleural effusions. 4. Apical emphysema. 5. Evidence of prior granulomatous disease with calcified granuloma in the lingula and calcified left hilar lymph node. Electronically Signed   By: Franchot Gallo M.D.   On: 08/23/2020 16:52   ECHOCARDIOGRAM COMPLETE  Result Date: 08/24/2020    ECHOCARDIOGRAM REPORT   Patient Name:   ABDURRAHMAN PETERSHEIM Date of Exam: 08/24/2020 Medical Rec #:  [id number]    Height:       69.0 in Accession #:    [id number]   Weight:       160.3 lb Date of Birth:  1966-08-27    BSA:          1.880 m Patient Age:    24 years     BP:           124/84 mmHg Patient Gender: M            HR:           88 bpm. Exam Location:  Inpatient Procedure: 2D Echo, Cardiac Doppler and Color Doppler Indications:    Dyspnea 786.09 / R06.00  History:        Patient has prior history of Echocardiogram examinations, most                 recent 12/16/2018. CHF and Cardiomyopathy, CAD and Previous                 Myocardial Infarction, PAD and COPD; Risk Factors:Hypertension,                  Dyslipidemia and Current Smoker. Cardiac Arrest.  Sonographer:    Tiffany Dance Referring Phys: [id number] ALEXANDER N RAINES IMPRESSIONS  1. EF 25-30% with RWMA consistent with prior LAD infarction. The LV is moderately dilated  compared with prior. MR is now severe. Mild to moderate AI with mild AS is present. AoV not well visualized.  2. The mid to apical anterior segments are akinetic. The apex is akinetic. The mid to distal septum is akinetic. Findings are consistent with prior LAD infarction. Left ventricular ejection fraction, by estimation, is 25 to 30%. The left ventricle has severely decreased function. The left ventricle demonstrates regional wall motion abnormalities (see scoring diagram/findings for description). The left ventricular internal cavity size was moderately dilated. Left ventricular diastolic function could not be evaluated.  3. Restricted PMVL in systole due to DCM (IIIB). 2D ERO 0.20 cm2 with Rvol 27 cc. This is grossly underestimated due to oval shape of ERO. The mitral valve is grossly normal. Severe mitral valve regurgitation.  4. Right ventricular systolic function is normal. The right ventricular size is mildly enlarged. There is mildly elevated pulmonary artery systolic pressure. The estimated right ventricular systolic pressure is 35.3 mmHg.  5. Left atrial size was severely dilated.  6. Right atrial size was mildly dilated.  7. The aortic valve was not well visualized. Aortic valve regurgitation is mild to moderate. Mild aortic valve stenosis. Aortic valve mean gradient measures 13.0 mmHg. Aortic valve Vmax measures 2.35 m/s.  8. The inferior vena cava is normal in size with greater than 50% respiratory variability, suggesting right atrial pressure of 3 mmHg. Comparison(s): Changes from prior study are noted. Conclusion(s)/Recommendation(s): Findings consistent with ischemic cardiomyopathy. FINDINGS  Left Ventricle: The mid to apical anterior segments are akinetic. The  apex is akinetic. The mid to distal septum is akinetic. Findings are consistent with prior LAD infarction. Left ventricular ejection fraction, by estimation, is 25 to 30%. The left ventricle has severely decreased function. The left ventricle demonstrates regional wall motion abnormalities. The left ventricular internal cavity size was moderately dilated. There is no left ventricular hypertrophy. Left ventricular diastolic function  could not be evaluated due to mitral regurgitation (moderate or greater). Left ventricular diastolic function could not be evaluated.  LV Wall Scoring: The mid and distal anterior wall, mid and distal anterior septum, apical lateral segment, mid inferoseptal segment, and apex are akinetic. Right Ventricle: The right ventricular size is mildly enlarged. No increase in right ventricular wall thickness. Right ventricular systolic function is normal. There is mildly elevated pulmonary artery systolic pressure. The tricuspid regurgitant velocity is 3.17 m/s, and with an assumed right atrial pressure of 3 mmHg, the estimated right ventricular systolic pressure is 61.4 mmHg. Left Atrium: Left atrial size was severely dilated. Right Atrium: Right atrial size was mildly dilated. Pericardium: Trivial pericardial effusion is present. Mitral Valve: Restricted PMVL in systole due to DCM (IIIB). 2D ERO 0.20 cm2 with Rvol 27 cc. This is grossly underestimated due to oval shape of ERO. The mitral valve is grossly normal. Severe mitral valve regurgitation. Tricuspid Valve: The tricuspid valve is grossly normal. Tricuspid valve regurgitation is mild . No evidence of tricuspid stenosis. Aortic Valve: The aortic valve was not well visualized. Aortic valve regurgitation is mild to moderate. Aortic regurgitation PHT measures 414 msec. Mild aortic stenosis is present. Aortic valve mean gradient measures 13.0 mmHg. Aortic valve peak gradient  measures 22.1 mmHg. Aortic valve area, by VTI measures 1.34 cm.  Pulmonic Valve: The pulmonic valve was grossly normal. Pulmonic valve regurgitation is mild. No evidence of pulmonic stenosis. Aorta: The aortic root and ascending aorta are structurally normal, with no evidence of dilitation. Venous: The inferior vena cava is normal in size with greater  than 50% respiratory variability, suggesting right atrial pressure of 3 mmHg. IAS/Shunts: The atrial septum is grossly normal.  LEFT VENTRICLE PLAX 2D LVIDd:         6.20 cm LVIDs:         5.20 cm LV PW:         1.30 cm LV IVS:        1.00 cm LVOT diam:     2.10 cm LV SV:         52 LV SV Index:   28 LVOT Area:     3.46 cm  RIGHT VENTRICLE             IVC RV Basal diam:  3.10 cm     IVC diam: 2.00 cm RV Mid diam:    2.30 cm RV S prime:     12.30 cm/s TAPSE (M-mode): 2.2 cm LEFT ATRIUM              Index       RIGHT ATRIUM           Index LA diam:        4.80 cm  2.55 cm/m  RA Area:     24.60 cm LA Vol (A2C):   126.0 ml 67.01 ml/m RA Volume:   86.15 ml  45.81 ml/m LA Vol (A4C):   96.5 ml  51.32 ml/m LA Biplane Vol: 113.0 ml 60.09 ml/m  AORTIC VALVE AV Area (Vmax):    1.16 cm AV Area (Vmean):   1.09 cm AV Area (VTI):     1.34 cm AV Vmax:           235.00 cm/s AV Vmean:          171.000 cm/s AV VTI:            0.388 m AV Peak Grad:      22.1 mmHg AV Mean Grad:      13.0 mmHg LVOT Vmax:         78.80 cm/s LVOT Vmean:        53.650 cm/s LVOT VTI:          0.151 m LVOT/AV VTI ratio: 0.39 AI PHT:            414 msec  AORTA Ao Root diam: 3.20 cm Ao Asc diam:  3.10 cm MITRAL VALVE                 TRICUSPID VALVE MV Area (PHT): 2.90 cm      TR Peak grad:   40.2 mmHg MV Decel Time: 262 msec      TR Vmax:        317.00 cm/s MR Peak grad:    82.4 mmHg MR Mean grad:    61.0 mmHg   SHUNTS MR Vmax:         454.00 cm/s Systemic VTI:  0.15 m MR Vmean:        370.0 cm/s  Systemic Diam: 2.10 cm MR PISA:         2.26 cm MR PISA Eff ROA: 20 mm MR PISA Radius:  0.60 cm MV E velocity: 122.00 cm/s MV A velocity: 42.30 cm/s MV E/A ratio:  2.88  Eleonore Chiquito MD Electronically signed by Eleonore Chiquito MD Signature Date/Time: 08/24/2020/1:09:15 PM    Final      Discharge Instructions: Discharge Instructions     (HEART FAILURE PATIENTS) Call MD:  Anytime you have any of the following symptoms: 1) 3 pound weight  gain in 24 hours or 5 pounds in 1 week 2) shortness of breath, with or without a dry hacking cough 3) swelling in the hands, feet or stomach 4) if you have to sleep on extra pillows at night in order to breathe.   Complete by: As directed    Diet - low sodium heart healthy   Complete by: As directed    Heart Failure patients record your daily weight using the same scale at the same time of day   Complete by: As directed    Increase activity slowly   Complete by: As directed        Signed: Madalyn Rob, MD 08/25/2020, 3:31 PM

## 2020-08-25 NOTE — Progress Notes (Addendum)
Subjective:  No acute events over night.   Patient was seen and examined at bedside. He says he feels better than when he came into the ED and reported sleeping 3-4 hours last night, which is the most he's slept in the past few weeks. He denied SOB and CP this morning. He shared he was seen by cardiology yesterday and intends to continue to follow up with [facility] in [place]. We also discussed the possibility of surgical repair and offered to be seen by CVTS. The patient agreed with this plan and would like to learn more about his options. After meeting with CVTS, he feels well enough to go home.    Objective:  Vital signs in last 24 hours: Vitals:   08/24/20 1616 08/24/20 2112 08/25/20 0549 08/25/20 0809  BP: 117/77 101/71 114/76 (!) 101/58  Pulse: 91 78 88 80  Resp: 16 16 18 18   Temp: 98.4 F (36.9 C) 98.5 F (36.9 C) 97.9 F (36.6 C) 98.2 F (36.8 C)  TempSrc: Oral Oral Oral Oral  SpO2: 100% 99% 100% 99%  Weight:   66.3 kg   Height:       Weight change: -6.429 kg  Intake/Output Summary (Last 24 hours) at 08/25/2020 0942 Last data filed at 08/25/2020 0826 Gross per 24 hour  Intake 1093 ml  Output 4425 ml  Net -3332 ml   Physical Exam Vitals and nursing note reviewed.  Constitutional:      General: He is not in acute distress.    Appearance: He is well-developed.  Cardiovascular:     Rate and Rhythm: Normal rate and regular rhythm.     Pulses: Normal pulses.     Heart sounds: Murmur heard.  Systolic murmur is present with a grade of 3/6.      Comments: Holosystolic murmur appreciated at apex. Pulmonary:     Effort: Pulmonary effort is normal. No respiratory distress.     Breath sounds: Examination of the right-lower field reveals decreased breath sounds. Examination of the left-lower field reveals decreased breath sounds. Decreased breath sounds present.  Abdominal:     General: Bowel sounds are normal.     Palpations: Abdomen is soft.    Musculoskeletal:     Right lower leg: No edema.     Left lower leg: No edema.  Neurological:     General: No focal deficit present.     Mental Status: He is alert and oriented to person, place, and time.  Psychiatric:        Mood and Affect: Mood normal.        Behavior: Behavior normal.      Assessment/Plan:  Principal Problem:   Acute decompensated heart failure (HCC) Active Problems:   Ischemic cardiomyopathy   Iron deficiency anemia   CAD in native artery   AVM (arteriovenous malformation) of small bowel, acquired with hemorrhage   Essential hypertension   Abnormal LFTs  Howard Thompson is a 54 y.o. male with a PMHx of ischemic cardiomyopathy with HFrEF (LVEF 25-30%), MI with DES in LAD in 2019 and in-stent thrombosis in 2020, PAD s/p left femoral bypass, COPD, myasthenia gravis s/p thymectomy, OSA, and colonic AVMs with recurrent bleeds presents to [facility] with c/o progressively worsening DOE, orthopnea, and PND over the past few weeks.   HFrEF exacerbation Ischemic cardiomyopathy Presents withseveral weeks of DOE, orthopnea, fatigue.Echo on 08/24/2020 shows EF 25-30% with severe MR and slightly elevated RV systolic pressures. Patient reports improvement and relief from admitting symptoms. He  diuresed well overnight (net negative 3.69L).  Cr. Is 1.3. He was seen by cardiology and they will continue to follow him.  -Cardiology recommendations reviewed -Consulted CVTS, will wait for their recommendation  -Continue Coreg 3.125mg  BID with meals -Transitioned to PO Lasix 40 mg daily -Continue spironolactone 25mg  daily -Continue atorvastatin 40mg  daily -Daily CMP, monitor electrolytes -Patient responded well to Kcl (4.2) and Mg (2.1) repletion  -Monitor I/Os and daily weights -Low Na diet -Consider discharge to home based on CVTS and continues to remain medically stable -Encourage medication compliance   AKI Typically has normal renal function, baseline Cr serum is  1.0. 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 Hemoglobin is stable at 10.0 and Hct is 35.4. -Iron panel: Fe of 24, ferritin of 18 -Give IV Feraheme before discharge -Monitor H/H -daily CBC    CAD s/p STEMI x2 and DES to LAD Patient has a history of MI with DES in LAD in 2019 and in-stent thrombosis in 2020, and PAD s/p left femoral bypass. He denies any chest pain.  -Discontinue Brilinta -Transition to plavix monotherapy (given history of GI bleeding) -Continue Coreg 3.125mg  BID with meals -Consider adding Entresto if BP tolerates it as outpatient -Continue atorvastatin 40 mg daily -Encourage medication compliance    Essential HTN Patient is managed with home medications. Well controlled. -Continue Coreg 3.125mg  BID -Continue spironolactone 25mg  daily    LOS: 2 days   Lytle Michaels, Medical Student 08/25/2020, 9:42 AM

## 2020-08-25 NOTE — Progress Notes (Signed)
Progress Note  Patient Name: Howard Thompson Date of Encounter: 08/25/2020  [facility] Cardiologist: Shelva Majestic, MD   Subjective  Patient feels much better today. Hoping to go home. No chest pain or SOB.  Diuresed well overnight. Net negative 3.69L. Cr stable at 1.3. Transitioned to lasix 40mg  PO today.  Inpatient Medications    Scheduled Meds: . atorvastatin  40 mg Oral Daily  . carvedilol  3.125 mg Oral BID WC  . clopidogrel  75 mg Oral Daily  . furosemide  40 mg Oral Daily  . ramelteon  8 mg Oral QHS  . sodium chloride flush  3 mL Intravenous Q12H  . spironolactone  25 mg Oral Daily   Continuous Infusions: . sodium chloride     PRN Meds: sodium chloride, acetaminophen, ondansetron (ZOFRAN) IV, sodium chloride flush   Vital Signs    Vitals:   08/24/20 1616 08/24/20 2112 08/25/20 0549 08/25/20 0809  BP: 117/77 101/71 114/76 (!) 101/58  Pulse: 91 78 88 80  Resp: 16 16 18 18   Temp: 98.4 F (36.9 C) 98.5 F (36.9 C) 97.9 F (36.6 C) 98.2 F (36.8 C)  TempSrc: Oral Oral Oral Oral  SpO2: 100% 99% 100% 99%  Weight:   66.3 kg   Height:        Intake/Output Summary (Last 24 hours) at 08/25/2020 0910 Last data filed at 08/25/2020 0826 Gross per 24 hour  Intake 1093 ml  Output 4425 ml  Net -3332 ml   Last 3 Weights 08/25/2020 08/23/2020 03/15/2019  Weight (lbs) 146 lb 1.6 oz 160 lb 4.4 oz 160 lb 3.2 oz  Weight (kg) 66.271 kg 72.7 kg 72.666 kg  Some encounter information is confidential and restricted. Go to Review Flowsheets activity to see all data.      Telemetry    NSR with occasional PVCs. One episode of junctional tachycardia - Personally Reviewed  ECG    No new ECG - Personally Reviewed  Physical Exam   GEN: No acute distress.   Neck: No JVD Cardiac: RR, III/VI systolic murmur best heard at the apex Respiratory: Clear to auscultation bilaterally. GI: Soft, nontender, non-distended  MS: No edema; No deformity. Neuro:  Nonfocal  Psych:  Normal affect   Labs    High Sensitivity Troponin:   Recent Labs  Lab 08/23/20 1233 08/23/20 1429  TROPONINIHS 17 15      Chemistry Recent Labs  Lab 08/23/20 1233 08/23/20 1351 08/24/20 0216 08/25/20 0553  NA 137  --  137 138  K 4.2  --  3.1* 4.2  CL 105  --  102 101  CO2 21*  --  25 27  GLUCOSE 154*  --  134* 103*  BUN 26*  --  24* 26*  CREATININE 1.39*  --  1.26* 1.30*  CALCIUM 9.4  --  8.9 9.3  PROT  --  6.6 5.7* 6.6  ALBUMIN  --  3.4* 2.8* 3.4*  AST  --  110* 76* 58*  ALT  --  134* 111* 98*  ALKPHOS  --  132* 114 123  BILITOT  --  1.4* 1.1 1.9*  GFRNONAA >60  --  >60 >60  ANIONGAP 11  --  10 10     Hematology Recent Labs  Lab 08/23/20 1233 08/24/20 0216 08/25/20 0553  WBC 7.1 7.2 7.5  RBC 5.04 4.50 5.10  HGB 9.9* 8.9* 10.0*  HCT 36.0* 31.0* 35.4*  MCV 71.4* 68.9* 69.4*  MCH 19.6* 19.8* 19.6*  MCHC 27.5*  28.7* 28.2*  RDW 22.2* 21.5* 21.9*  PLT 369 307 362    BNP Recent Labs  Lab 08/23/20 1342  BNP 1,451.1*     DDimer No results for input(s): DDIMER in the last 168 hours.   Radiology    DG Chest 2 View  Result Date: 08/23/2020 CLINICAL DATA:  Chest pain. EXAM: CHEST - 2 VIEW COMPARISON:  Mar 14, 2019. FINDINGS: Stable cardiomegaly is noted with mild central pulmonary vascular congestion. Mild bilateral perihilar edema may be present. Sternotomy wires are noted. No pneumothorax or pleural effusion is noted. Bony thorax is unremarkable. IMPRESSION: Stable cardiomegaly with mild central pulmonary vascular congestion. Mild bilateral perihilar edema may be present. Electronically Signed   By: Marijo Conception M.D.   On: 08/23/2020 13:08   CT Angio Chest PE W and/or Wo Contrast  Result Date: 08/23/2020 CLINICAL DATA:  Short of breath and weakness 1 week. Rule out pulmonary embolism. EXAM: CT ANGIOGRAPHY CHEST WITH CONTRAST TECHNIQUE: Multidetector CT imaging of the chest was performed using the standard protocol during bolus administration of  intravenous contrast. Multiplanar CT image reconstructions and MIPs were obtained to evaluate the vascular anatomy. CONTRAST:  94mL OMNIPAQUE IOHEXOL 350 MG/ML SOLN COMPARISON:  Chest x-ray 08/23/2020 FINDINGS: Cardiovascular: Cardiac enlargement with left ventricular enlargement. Left coronary stent. Median sternotomy. No pericardial effusion. Pulmonary arteries normal in caliber. Negative for pulmonary embolism. Mild atherosclerotic calcification aortic arch without aneurysm. Lack of opacification of the aorta precludes evaluation for dissection. Mediastinum/Nodes: 23 mm calcified left lower hilar lymph node. 16 mm noncalcified right hilar lymph node. No mediastinal mass. Lungs/Pleura: Small bilateral pleural effusions. Mild emphysema most prominent the apices. 4 mm calcified granuloma in the lingula. No acute infiltrate or mass lesion in the lungs. Upper Abdomen: No acute abnormality. Musculoskeletal: Mild thoracic degenerative changes. No acute skeletal abnormality. Review of the MIP images confirms the above findings. IMPRESSION: 1. Negative for pulmonary embolism. 2. Cardiac enlargement with left ventricular enlargement. Left coronary stent. 3. Small pleural effusions. 4. Apical emphysema. 5. Evidence of prior granulomatous disease with calcified granuloma in the lingula and calcified left hilar lymph node. Electronically Signed   By: Franchot Gallo M.D.   On: 08/23/2020 16:52   ECHOCARDIOGRAM COMPLETE  Result Date: 08/24/2020    ECHOCARDIOGRAM REPORT   Patient Name:   Howard Thompson Date of Exam: 08/24/2020 Medical Rec #:  [id number]    Height:       69.0 in Accession #:    [id number]   Weight:       160.3 lb Date of Birth:  [birth year]    BSA:          1.880 m Patient Age:    54 years     BP:           124/84 mmHg Patient Gender: M            HR:           88 bpm. Exam Location:  Inpatient Procedure: 2D Echo, Cardiac Doppler and Color Doppler Indications:    Dyspnea 786.09 / R06.00  History:        Patient  has prior history of Echocardiogram examinations, most                 recent 12/16/2018. CHF and Cardiomyopathy, CAD and Previous                 Myocardial Infarction, PAD and COPD; Risk Factors:Hypertension,  Dyslipidemia and Current Smoker. Cardiac Arrest.  Sonographer:    Tiffany Dance Referring Phys: [id number] ALEXANDER N RAINES IMPRESSIONS  1. EF 25-30% with RWMA consistent with prior LAD infarction. The LV is moderately dilated compared with prior. MR is now severe. Mild to moderate AI with mild AS is present. AoV not well visualized.  2. The mid to apical anterior segments are akinetic. The apex is akinetic. The mid to distal septum is akinetic. Findings are consistent with prior LAD infarction. Left ventricular ejection fraction, by estimation, is 25 to 30%. The left ventricle has severely decreased function. The left ventricle demonstrates regional wall motion abnormalities (see scoring diagram/findings for description). The left ventricular internal cavity size was moderately dilated. Left ventricular diastolic function could not be evaluated.  3. Restricted PMVL in systole due to DCM (IIIB). 2D ERO 0.20 cm2 with Rvol 27 cc. This is grossly underestimated due to oval shape of ERO. The mitral valve is grossly normal. Severe mitral valve regurgitation.  4. Right ventricular systolic function is normal. The right ventricular size is mildly enlarged. There is mildly elevated pulmonary artery systolic pressure. The estimated right ventricular systolic pressure is 25.0 mmHg.  5. Left atrial size was severely dilated.  6. Right atrial size was mildly dilated.  7. The aortic valve was not well visualized. Aortic valve regurgitation is mild to moderate. Mild aortic valve stenosis. Aortic valve mean gradient measures 13.0 mmHg. Aortic valve Vmax measures 2.35 m/s.  8. The inferior vena cava is normal in size with greater than 50% respiratory variability, suggesting right atrial pressure of 3 mmHg.  Comparison(s): Changes from prior study are noted. Conclusion(s)/Recommendation(s): Findings consistent with ischemic cardiomyopathy. FINDINGS  Left Ventricle: The mid to apical anterior segments are akinetic. The apex is akinetic. The mid to distal septum is akinetic. Findings are consistent with prior LAD infarction. Left ventricular ejection fraction, by estimation, is 25 to 30%. The left ventricle has severely decreased function. The left ventricle demonstrates regional wall motion abnormalities. The left ventricular internal cavity size was moderately dilated. There is no left ventricular hypertrophy. Left ventricular diastolic function  could not be evaluated due to mitral regurgitation (moderate or greater). Left ventricular diastolic function could not be evaluated.  LV Wall Scoring: The mid and distal anterior wall, mid and distal anterior septum, apical lateral segment, mid inferoseptal segment, and apex are akinetic. Right Ventricle: The right ventricular size is mildly enlarged. No increase in right ventricular wall thickness. Right ventricular systolic function is normal. There is mildly elevated pulmonary artery systolic pressure. The tricuspid regurgitant velocity is 3.17 m/s, and with an assumed right atrial pressure of 3 mmHg, the estimated right ventricular systolic pressure is 53.9 mmHg. Left Atrium: Left atrial size was severely dilated. Right Atrium: Right atrial size was mildly dilated. Pericardium: Trivial pericardial effusion is present. Mitral Valve: Restricted PMVL in systole due to DCM (IIIB). 2D ERO 0.20 cm2 with Rvol 27 cc. This is grossly underestimated due to oval shape of ERO. The mitral valve is grossly normal. Severe mitral valve regurgitation. Tricuspid Valve: The tricuspid valve is grossly normal. Tricuspid valve regurgitation is mild . No evidence of tricuspid stenosis. Aortic Valve: The aortic valve was not well visualized. Aortic valve regurgitation is mild to moderate. Aortic  regurgitation PHT measures 414 msec. Mild aortic stenosis is present. Aortic valve mean gradient measures 13.0 mmHg. Aortic valve peak gradient  measures 22.1 mmHg. Aortic valve area, by VTI measures 1.34 cm. Pulmonic Valve: The pulmonic valve was grossly normal.  Pulmonic valve regurgitation is mild. No evidence of pulmonic stenosis. Aorta: The aortic root and ascending aorta are structurally normal, with no evidence of dilitation. Venous: The inferior vena cava is normal in size with greater than 50% respiratory variability, suggesting right atrial pressure of 3 mmHg. IAS/Shunts: The atrial septum is grossly normal.  LEFT VENTRICLE PLAX 2D LVIDd:         6.20 cm LVIDs:         5.20 cm LV PW:         1.30 cm LV IVS:        1.00 cm LVOT diam:     2.10 cm LV SV:         52 LV SV Index:   28 LVOT Area:     3.46 cm  RIGHT VENTRICLE             IVC RV Basal diam:  3.10 cm     IVC diam: 2.00 cm RV Mid diam:    2.30 cm RV S prime:     12.30 cm/s TAPSE (M-mode): 2.2 cm LEFT ATRIUM              Index       RIGHT ATRIUM           Index LA diam:        4.80 cm  2.55 cm/m  RA Area:     24.60 cm LA Vol (A2C):   126.0 ml 67.01 ml/m RA Volume:   86.15 ml  45.81 ml/m LA Vol (A4C):   96.5 ml  51.32 ml/m LA Biplane Vol: 113.0 ml 60.09 ml/m  AORTIC VALVE AV Area (Vmax):    1.16 cm AV Area (Vmean):   1.09 cm AV Area (VTI):     1.34 cm AV Vmax:           235.00 cm/s AV Vmean:          171.000 cm/s AV VTI:            0.388 m AV Peak Grad:      22.1 mmHg AV Mean Grad:      13.0 mmHg LVOT Vmax:         78.80 cm/s LVOT Vmean:        53.650 cm/s LVOT VTI:          0.151 m LVOT/AV VTI ratio: 0.39 AI PHT:            414 msec  AORTA Ao Root diam: 3.20 cm Ao Asc diam:  3.10 cm MITRAL VALVE                 TRICUSPID VALVE MV Area (PHT): 2.90 cm      TR Peak grad:   40.2 mmHg MV Decel Time: 262 msec      TR Vmax:        317.00 cm/s MR Peak grad:    82.4 mmHg MR Mean grad:    61.0 mmHg   SHUNTS MR Vmax:         454.00 cm/s Systemic VTI:   0.15 m MR Vmean:        370.0 cm/s  Systemic Diam: 2.10 cm MR PISA:         2.26 cm MR PISA Eff ROA: 20 mm MR PISA Radius:  0.60 cm MV E velocity: 122.00 cm/s MV A velocity: 42.30 cm/s MV E/A ratio:  2.88 Eleonore Chiquito MD Electronically signed by Eleonore Chiquito MD Signature Date/Time: 08/24/2020/1:09:15  PM    Final     Cardiac Studies   Echo 08/24/20: 1. EF 25-30% with RWMA consistent with prior LAD infarction. The LV is  moderately dilated compared with prior. MR is now severe. Mild to moderate  AI with mild AS is present. AoV not well visualized.  2. The mid to apical anterior segments are akinetic. The apex is  akinetic. The mid to distal septum is akinetic. Findings are consistent  with prior LAD infarction. Left ventricular ejection fraction, by  estimation, is 25 to 30%. The left ventricle has  severely decreased function. The left ventricle demonstrates regional wall  motion abnormalities (see scoring diagram/findings for description). The  left ventricular internal cavity size was moderately dilated. Left  ventricular diastolic function could  not be evaluated.  3. Restricted PMVL in systole due to DCM (IIIB). 2D ERO 0.20 cm2 with  Rvol 27 cc. This is grossly underestimated due to oval shape of ERO. The  mitral valve is grossly normal. Severe mitral valve regurgitation.  4. Right ventricular systolic function is normal. The right ventricular  size is mildly enlarged. There is mildly elevated pulmonary artery  systolic pressure. The estimated right ventricular systolic pressure is  38.1 mmHg.  5. Left atrial size was severely dilated.  6. Right atrial size was mildly dilated.  7. The aortic valve was not well visualized. Aortic valve regurgitation  is mild to moderate. Mild aortic valve stenosis. Aortic valve mean  gradient measures 13.0 mmHg. Aortic valve Vmax measures 2.35 m/s.  8. The inferior vena cava is normal in size with greater than 50%  respiratory  variability, suggesting right atrial pressure of 3 mmHg.    Coronary intervention, IABP insertion 11/2018:  Ost LAD to Prox LAD lesion is 100% stenosed.  Post intervention, there is a 0% residual stenosis.  A stent was successfully placed.  Acute/Subacute thrombotic occlusion of the previously placed 3.0 x 20 mm Synergy stent in the proximal LAD with clot extending almost to the LAD ostium with chest pain duration of at least 30 hours. There was extensive recalcitrant thrombotic burden which persisted despite Angiomax, Aggrastat, multiple runs of Pronto thrombectomy, intracoronary nitroglycerin, intracoronary adenosine, distal infusion of adenosine, with ultimate complete resolution after insertion of an intra-aortic balloon pump for element of early cardiogenic shock and successful DES stenting of the LAD extending from the ostium to the mid vessel with insertion of a 3.5 x 38 mm Resolute stent postdilated to 3.75 mm with the 100% occlusion being reduced to 0% and after 3-1/2-hour procedure, ultimate reestablishment of brisk TIMI-3 flow antegrade down the LAD system which extends around the LV apex.  Normal left circumflex coronary artery.  Normal dominant RCA with initial faint collateralization of the LAD system via the distal RCA.  LVEDP at the start of the procedure was 30 mmHg.  Aggressive diuresis following Lasix 40 mg with ultimate 2.8 L of urine output during the procedure.  RECOMMENDATION: Continue Aggrastat for minimum of 18 hours post procedure. Bivalirudin will be discontinued 2 hours post procedure. We will plan to keep the intra-aortic balloon pump one-to-one today. Obtain follow-up 2D echo Doppler study. Recommend long-term dual antiplatelet therapy as long as there is no recurrent GI bleeds. Will assess drug screen with remote history of cocaine use. Smoking cessation is essential. Aggressive lipid-lowering therapy with target LDL less than 70.  Patient  Profile     54 y.o. male with a hx of CAD s/p Vfib arrest and STEMI in 04/2018 and  a second STEMI in 11/2018 due , HFrEF with EF 25-30%, HTN, HLD, COPD, upper GI bleed, MG s/p thymectomy, and polysubstance abuse disorder who presented with acute on chronic heart failure exacerbation for which cardiology is consulted.  Assessment & Plan   #Acute on chronic systolic heart failure #Severe MR: TTE with LVEF 25-30% with severe MR, severely dilated LA and mild AS. Presenting acutely volume overloaded with worsening DOE, orthopnea and LE edema with BNP 1451 consistent with acute on chronic systolic heart failure.  Reports compliance with home BB, ARB, spiro and lasix but has history of non-compliance in the past. Now improving with IV diuresis. - Transitioned to lasix 40mg  PO daily - Continue coreg 3.125mg  BID - Continue spironolactone 25mg  daily - BP soft; would benefit from low dose entresto if BP tolerates as out-patient - Can consider jardiance as well although compliance is an issue; can add as out-patient - Monitor I/Os and daily weights - Low Na diet  #CAD s/p STEMI x 2 Patient with history of STEMI on ASA and brilinta s/p PCI with subsequent GIB later transitioned to ASA and plavix. Patient self-discontinued plavix and returned with acute STEMI due to thrombotic occlusion of prior stent. He was again placed on ASA and brilinta and had another GIB. Now on plavix monotherapy. -Given history of multiple GIB, transitioned to plavix monotherapy -Continue coreg as above -Will add ACE/ARB as BP tolerates -Encourage medication compliance  #Hypertension Well controlled.  -Coreg and spiro as above  #Hyperlipidemia with LDL goal < 70 - Continue 40 mg lipitor  #Polysubstance abuse - Continue ongoing counseling about abstinence from substance abuse      For questions or updates, please contact [place] Please consult [email or web address] for contact info under        Signed, Freada Bergeron, MD  08/25/2020, 9:10 AM

## 2020-08-25 NOTE — Progress Notes (Signed)
Subjective: Patient examined, images of echocardiogram performed this admission and most recent coronary angiogram personally reviewed.  54 year old nondiabetic smoker with history of anterior MI and occluded LAD opened with PCI in 2019.  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  He has been stable since May 2020 when he was admitted briefly for acute systolic heart failure and pulmonary edema.  Currently the patient presented to the ED with acute respiratory distress and pulmonary edema with preceding symptoms of sinusitis and coughing up green phlegm.  He also had some chest tightness.  The patient was unable to walk and was short of breath at rest.  He responded well to IV diuresis and a cardiology consultation was placed.  Echocardiogram was performed showing EF 25% with severe mitral vegetation, mild TR and mild aortic valve disease.  He is in sinus rhythm.  Troponins were negative.  The patient's last coronary arteriogram in 2020 showed normal RCA, normal circumflex, and a LAD which was occluded and treated with PCI and reconstituted.  Patient has history of myasthenia gravis and had a sternotomy and thymectomy at [facility] over 10 years ago.  He takes no medications for myasthenia gravis at the current time.  Patient has history of peripheral vascular disease and had a left femoral endarterectomy and vein patch for occlusive atherosclerotic vascular disease.  He now denies any symptoms of claudication of the left leg.  The patient smokes a half a pack per day.  He states he is not a heavy alcohol consumer but has been in the past.  He denies any cocaine use for over a year.  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  He admits he is having some problems with his medicines due to expense.  Objective: Vital signs in last 24 hours: Temp:  [97.8 F (36.6 C)-98.5 F  (36.9 C)] 97.8 F (36.6 C) (10/30 1136) Pulse Rate:  [78-91] 89 (10/30 1136) Cardiac Rhythm: Normal sinus rhythm (10/30 0700) Resp:  [14-18] 14 (10/30 1136) BP: (100-117)/(58-78) 100/78 (10/30 1136) SpO2:  [99 %-100 %] 100 % (10/30 1136) Weight:  [66.3 kg] 66.3 kg (10/30 0549)  Hemodynamic parameters for last 24 hours:    Intake/Output from previous day: 10/29 0701 - 10/30 0700 In: 613 [P.O.:610; I.V.:3] Out: 4300 [Urine:4300] Intake/Output this shift: Total I/O In: 480 [P.O.:480] Out: 725 [Urine:725]        Physical Exam  General: Small middle-aged male breathing comfortably no acute distress HEENT: Normocephalic pupils equal , dentition with broken left mandibular tooth noted Neck: Supple without JVD, adenopathy, or bruit Chest: Clear to auscultation, symmetrical breath sounds, no rhonchi, no tenderness             or deformity Cardiovascular: Regular rate and rhythm, 4/6 MR murmur radiating to the left axilla, + S3 gallop, peripheral pulses             palpable in all extremities Abdomen:  Soft, nontender, no palpable mass or organomegaly Extremities: Warm, well-perfused, no clubbing cyanosis edema or tenderness,              no venous stasis changes of the legs Rectal/GU: Deferred Neuro: Grossly non--focal and symmetrical throughout Skin: Clean and dry without rash or ulceration   Lab Results: Recent Labs    08/24/20 0216 08/25/20 0553  WBC 7.2 7.5  HGB 8.9* 10.0*  HCT 31.0* 35.4*  PLT 307 362  BMET:  Recent Labs    08/24/20 0216 08/25/20 0553  NA 137 138  K 3.1* 4.2  CL 102 101  CO2 25 27  GLUCOSE 134* 103*  BUN 24* 26*  CREATININE 1.26* 1.30*  CALCIUM 8.9 9.3    PT/INR:  Recent Labs    08/24/20 0216  LABPROT 16.6*  INR 1.4*   ABG    Component Value Date/Time   PHART 7.515 (H) 06/04/2018 0924   HCO3 27.0 06/04/2018 0924   TCO2 20 (L) 06/01/2018 0505   ACIDBASEDEF 6.0 (H) 06/01/2018 0505   O2SAT 95.1 06/04/2018 0924   CBG (last 3)   No results for input(s): GLUCAP in the last 72 hours.  Assessment/Plan: S/P Admission for flash pulmonary edema Ischemic cardiomyopathy status post anterior MI with LAD PCI in 2019.  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 Stanford Breed at the [facility] cardiology clinic.  This is the second hospitalization for acute heart failure in less than 1.5 years.  He should be considered for high risk mitral valve replacement repair but would need repeat right and left heart cath, TEE as well as dental evaluation and probable extractions.  Patient can be referred back to our office as an outpatient to further discuss high risk mitral surgery and possible CABG pending results of left heart cath.   LOS: 2 days    Tharon Aquas Trigt III 08/25/2020

## 2020-09-17 ENCOUNTER — Other Ambulatory Visit: Payer: Self-pay | Admitting: Internal Medicine

## 2020-09-25 ENCOUNTER — Other Ambulatory Visit: Payer: Self-pay | Admitting: Internal Medicine

## 2020-09-25 ENCOUNTER — Other Ambulatory Visit: Payer: Self-pay | Admitting: Cardiovascular Disease

## 2020-10-21 ENCOUNTER — Other Ambulatory Visit: Payer: Self-pay | Admitting: Internal Medicine

## 2020-10-26 ENCOUNTER — Other Ambulatory Visit: Payer: Self-pay | Admitting: Internal Medicine

## 2020-11-16 ENCOUNTER — Other Ambulatory Visit: Payer: Self-pay | Admitting: Physician Assistant

## 2021-01-17 ENCOUNTER — Other Ambulatory Visit: Payer: Self-pay | Admitting: Cardiovascular Disease

## 2021-01-17 MED ORDER — CARVEDILOL 3.125 MG PO TABS: 3.1250 mg | ORAL_TABLET | Freq: Two times a day (BID) | ORAL | 0 refills | Status: DC

## 2021-01-17 MED ORDER — SPIRONOLACTONE 25 MG PO TABS: 25.0000 mg | ORAL_TABLET | Freq: Every day | ORAL | 0 refills | Status: DC

## 2021-01-17 MED ORDER — ATORVASTATIN CALCIUM 40 MG PO TABS: 40.0000 mg | ORAL_TABLET | Freq: Every day | ORAL | 0 refills | Status: DC

## 2021-01-17 MED ORDER — FUROSEMIDE 40 MG PO TABS: 40.0000 mg | ORAL_TABLET | Freq: Every day | ORAL | 0 refills | Status: DC

## 2021-01-17 NOTE — Telephone Encounter (Signed)
Spoke with patient letting him know that all of his medications has been sent to pharmacy to be refilled except for his Brilinta because I did not see that medication on his list. I told that patient that I was sending a message to Dr. Claiborne Billings to make sure that he is supposed to be taking this medication before I refill it. The patient got unset and again I stressed to him that it was out of my scope of practice to just refill Brilinta without consulting with Dr. Claiborne Billings and that I would reach out and get it refilled as soon as I got the ok from Dr. Claiborne Billings. I also let him know that he could speak to my supervisor on manager if he had any other questions or concerns.

## 2021-01-17 NOTE — Telephone Encounter (Signed)
*  STAT* If patient is at the pharmacy, call can be transferred to refill team.   1. Which medications need to be refilled? (please list name of each medication and dose if known)  spironolactone (ALDACTONE) 25 MG tablet carvedilol (COREG) 3.125 MG tablet furosemide (LASIX) 40 MG tablet Brilinta 90 mg atorvastatin (LIPITOR) 40 MG tablet   2. Which pharmacy/location (including street and city if local pharmacy) is medication to be sent to?   [facility] in [place], [place] [address]  3. Do they need a 30 day or 90 day supply? 30 day   Patient states he has been out of the medication for weeks. He is scheduled for 02/13/2021. He needs the refill sent to [facility] not [facility].

## 2021-01-23 ENCOUNTER — Other Ambulatory Visit: Payer: Self-pay

## 2021-02-11 NOTE — H&P (View-Only) (Signed)
Cardiology Office Note:    Date:  02/13/2021   ID:  Howard Thompson, DOB [birth year], MRN [id number]  PCP:  Jamesetta Geralds, MD  Cardiologist:  Shelva Majestic, MD  Electrophysiologist:  None   Referring MD: Jamesetta Geralds, *   Chief Complaint: follow-up of CAD, CHF, and severe MR  History of Present Illness:    Howard Thompson is a 55 y.o. male with a history of CAD s/p ventricular fibrillation arrest and STEMI in 04/2018 s/p DES to LAD and another STEMI in 11/2020 after non-compliance with Plavix s/p repeat DES to LAD, ischemic cardiomyopathy/ chronic systolic CHF with EF of 64-33% on Echo in 07/2020, PAD s/p left femoral enarterecotmy with vein patch angioplasty and then ultimate femoral artery byass at [facility] in 2016/2017, hypertension, hyperlipidemia, COPD, GI bleed, myasthenia gravis s/p thymectomy, and polysubstance abuse (tobacco, cocaine, THC, benzodiazepines, and amphetamine use), and noncompliance who presents today for follow-up of CAD, CHF, and severe MR.  Patient present in 04/2018 with ventricular fibrillation arrest and STEMI and was found to have 100% occlusion of LAD which was treated to DES. Hospitalization was complicated by cardiogenic shock and ischemic cardiomyopathy with EF of 25-30%, GI bleed, acute renal failure requiring temporary dialysis, shock liver, acute encephalopathy, anemia, and duodenitis. EF prior to discharge improved to 40-45% and he did not require LifeVest at discharge. He was readmitted in 06/2018 with severe GI bleed with hemoglobin of 4.4. EGD showed a single non-bleeding angiodysplastic lesion in the duodenum with was treated with APC and clip.  Brilinta was changed to Plavix. Patient was instructed to follow-up with primary Cardiologist at [facility]. Patient was admitted in 11/2018 with late presenting STEMI after self discontinuing Plavix. LHC showed thrombotic occlusion of previously placed LAD stent with extensive thrombus burden. Intra-aortic  ballon pump was place given cardiogenic shock and patient underwent successful PCI with DES of LAD extending from the ostium to mid vessel. Echo showed LVEF of 25-30%. He was switched back to DAPT with Aspirin and Brilinta. He was again was loss to follow-up but was readmitted in in 12/2018 for acute GI bleed. Cardiology was consulted for assistance with DAPT - Aspirin was stopped but Brilinta was continued. Patient again did not follow-up with Cardiology after this admission.  He was last seen by Cardiology during an admission in 07/2020 for acute CHF. Echo showed LVEF of 25-30% with wall motion abnormalities consistent with prior LAD infarctions, mild AS, severe MR, and mildly elevated RVSP of 43.3mmHg. He was diuresed with IV Lasix and then transitioned to PO Lasix 40mg  daily. Beta-blocker and Spironolactone were continued with plans to add low dose Entresto as outpatient if BP allows. He was seen by Dr. Prescott Gum during admission for further evaluation of severe MR. Per Dr. Lucianne Lei Trigt's note: "He should be considered for high risk mitral valve replacement repair but would need repeat right and left heart cath, TEE, as well as dental evaluation and probable extractions.  Patient can be referred back to our office as an outpatient to further discuss high risk mitral surgery and possible CABG pending results of left heart cath." Patient again never followed up with Cardiology following discharge.  Patient presents today for follow-up because his pharmacy would not refill his medications until he was seen. Here with a caretaker. This is the first time he has been seen in our office in the outpatient setting. Previously followed at [facility]. Overall, he is stable from a cardiac standpoint. He does have some chronic shortness  of breath with exertion but this is stable. He does not sleep well at but states this is due to insomnia. No orthopnea. He does describes waking up feeling short of breath but actually  sounds more like sleep apnea. Caretaker describes apneic episodes. He states he has he has known sleep apnea but has not tolerated CPAP in the past. Not interested in another sleep study at this time. No chest pain. He notes occasional flutter in his chest but denies any sustained palpitations. He occasionally has some lightheadedness/dizziness if he stands too quickly. No syncope.   He has been out of Plavix for the past month and has been taking Aspirin stead. However, he reports compliance with the rest of his medications including Lipitor, Coreg, Spironolactone, and Laxis.  He is eager to have valve surgery now. He states his mother lives in [place] and is sick. He is not sure how much time she has left so he would like to go to [place] soon and introduce her to her grandson. However, he would like to go ahead and start work-up for valve surgery.  Of note, he did mention that he had a bypass surgery in his left leg at [facility] in the past and has not followed by with anyone on this. Reviewed Care Everywhere after visit and he does have a history of PAD s/p  left femoral enarterecotmy with vein patch angioplasty and then ultimate femoral artery byass at [facility] in 2016/2017. He reports occasional cramping in his legs at rest. This seemed to improve some with Pedialyte. Does not really describe claudication.  He also reports reflux. Previously on Protonix which helped but no longer has a prescription of this. He also reports occasionally left lower quadrant pain. Sound like he has been diagnosed with a hernia in the past. Advised patient to follow-up with PCP.  Past Medical History:  Diagnosis Date  . Acute renal failure ([place])    a. a. during 7-05/2018 adm for cardiac arrest, requiring HD temporarily.  . Alcohol abuse   . Anemia   . AVM (arteriovenous malformation) of colon   . Cardiac arrest ([place]) 05/22/2018   Vfib  . CHF (congestive heart failure) ([place])   . Cocaine abuse ([place])   . COPD  (chronic obstructive pulmonary disease) ([place])   . Coronary artery disease    a. hx of POBA in [place]. b. out-of-hospital cardiac arrest 04/2018 (vfib/STEMI) s/p DES to prox LAD (complex admission with GIB, renal failure requiring HD, encephalopathy). c. STEMI 11/2018 after pt stopped Plavix - s/p DES to LAD.  Marland Kitchen Duodenitis   . GI bleed   . History of blood transfusion   . Hyperlipidemia   . Hypokalemia   . Hypomagnesemia   . Iron deficiency anemia   . Ischemic cardiomyopathy    a. EF <25% at time of cath/arrest 04/2018, improved to 40-45% by f/u echo 06/09/18.  . Myasthenia gravis ([place])   . Myocardial infarction ([place]) 05/22/2018   STEMI  . PAD (peripheral artery disease) ([place])    a. femoral bypass  . Shock liver    a. during 7-05/2018 adm for cardiac arrest and again in 11/2018.  Marland Kitchen Thrombocytopenia ([place]) 04/2018  . Tobacco abuse   . Ventricular fibrillation [facility])     Past Surgical History:  Procedure Laterality Date  . ATHERECTOMY    . BIOPSY  07/16/2018   Procedure: BIOPSY;  Surgeon: Gatha Mayer, MD;  Location: [place];  Service: Endoscopy;;  . CARDIAC CATHETERIZATION  05/22/2018   stent  . COLONOSCOPY WITH PROPOFOL N/A 07/16/2018   Procedure: COLONOSCOPY WITH PROPOFOL;  Surgeon: Gatha Mayer, MD;  Location: [facility] ENDOSCOPY;  Service: Endoscopy;  Laterality: N/A;  . CORONARY ANGIOPLASTY    . CORONARY STENT INTERVENTION N/A 05/22/2018   Procedure: CORONARY STENT INTERVENTION;  Surgeon: Lorretta Harp, MD;  Location: [place] CV LAB;  Service: Cardiovascular;  Laterality: N/A;  . CORONARY/GRAFT ACUTE MI REVASCULARIZATION N/A 12/16/2018   Procedure: CORONARY/GRAFT ACUTE MI REVASCULARIZATION;  Surgeon: Troy Sine, MD;  Location: [place] CV LAB;  Service: Cardiovascular;  Laterality: N/A;  . ENTEROSCOPY N/A 01/22/2019   Procedure: ENTEROSCOPY;  Surgeon: Thornton Park, MD;  Location: [place];  Service: Gastroenterology;  Laterality: N/A;  .  ESOPHAGOGASTRODUODENOSCOPY (EGD) WITH PROPOFOL N/A 06/10/2018   Procedure: ESOPHAGOGASTRODUODENOSCOPY (EGD) WITH PROPOFOL;  Surgeon: Ladene Artist, MD;  Location: [facility] ENDOSCOPY;  Service: Endoscopy;  Laterality: N/A;  . ESOPHAGOGASTRODUODENOSCOPY (EGD) WITH PROPOFOL N/A 07/16/2018   Procedure: ESOPHAGOGASTRODUODENOSCOPY (EGD) WITH PROPOFOL;  Surgeon: Gatha Mayer, MD;  Location: [place];  Service: Endoscopy;  Laterality: N/A;  . FEMORAL ARTERY - POPLITEAL ARTERY BYPASS GRAFT    . GIVENS CAPSULE STUDY N/A 01/20/2019   Procedure: GIVENS CAPSULE STUDY;  Surgeon: Gatha Mayer, MD;  Location: [facility] ENDOSCOPY;  Service: Endoscopy;  Laterality: N/A;  . HOT HEMOSTASIS N/A 07/16/2018   Procedure: HOT HEMOSTASIS (ARGON PLASMA COAGULATION/BICAP);  Surgeon: Gatha Mayer, MD;  Location: [facility] ENDOSCOPY;  Service: Endoscopy;  Laterality: N/A;  . HOT HEMOSTASIS N/A 01/22/2019   Procedure: HOT HEMOSTASIS (ARGON PLASMA COAGULATION/BICAP);  Surgeon: Thornton Park, MD;  Location: [place];  Service: Gastroenterology;  Laterality: N/A;  . IABP INSERTION N/A 12/16/2018   Procedure: IABP Insertion;  Surgeon: Troy Sine, MD;  Location: [place] CV LAB;  Service: Cardiovascular;  Laterality: N/A;  . IR FLUORO GUIDE CV LINE RIGHT  05/31/2018  . IR REMOVAL TUN CV CATH W/O FL  06/21/2018  . IR US GUIDE VASC ACCESS RIGHT  05/31/2018  . LEFT HEART CATH AND CORONARY ANGIOGRAPHY N/A 05/22/2018   Procedure: LEFT HEART CATH AND CORONARY ANGIOGRAPHY;  Surgeon: Lorretta Harp, MD;  Location: [place] CV LAB;  Service: Cardiovascular;  Laterality: N/A;  . LEFT HEART CATH AND CORONARY ANGIOGRAPHY N/A 12/16/2018   Procedure: LEFT HEART CATH AND CORONARY ANGIOGRAPHY;  Surgeon: Troy Sine, MD;  Location: [place] CV LAB;  Service: Cardiovascular;  Laterality: N/A;    Current Medications: Current Meds  Medication Sig  . atorvastatin (LIPITOR) 40 MG tablet Take 1 tablet (40 mg total) by mouth daily.  .  furosemide (LASIX) 40 MG tablet Take 1 tablet (40 mg total) by mouth daily.  . nitroGLYCERIN (NITROSTAT) 0.4 MG SL tablet Place 1 tablet (0.4 mg total) under the tongue every 5 (five) minutes as needed for chest pain (max 3 doses).  . pantoprazole (PROTONIX) 40 MG tablet Take 1 tablet (40 mg total) by mouth daily.  Marland Kitchen spironolactone (ALDACTONE) 25 MG tablet Take 1 tablet (25 mg total) by mouth daily.  . [DISCONTINUED] carvedilol (COREG) 3.125 MG tablet Take 1 tablet (3.125 mg total) by mouth 2 (two) times daily with a meal. Please schedule annual appt for refills. [phone]. 1st attempt.  . [DISCONTINUED] clopidogrel (PLAVIX) 75 MG tablet Take 1 tablet (75 mg total) by mouth daily.     Allergies:   Patient has no known allergies.   Social History   Socioeconomic History  . Marital status:  Divorced    Spouse name: Not on file  . Number of children: Not on file  . Years of education: Not on file  . Highest education level: Not on file  Occupational History    Employer: [employer]  Tobacco Use  . Smoking status: Current Every Day Smoker    Packs/day: 0.50    Types: Cigarettes  . Smokeless tobacco: Current User  Vaping Use  . Vaping Use: Never used  Substance and Sexual Activity  . Alcohol use: Yes    Comment: none in past several weeks, but normally about 5 beers a week, endorses binge drinking at celebrations  . Drug use: Yes    Types: Cocaine, Marijuana, Methamphetamines, Benzodiazepines    Comment: vaguely discusses useage, but quickly changes subject  . Sexual activity: Yes    Partners: Female  Other Topics Concern  . Not on file  Social History Narrative   Divorced   Employed at [employer]   Polysubstance abuse   Social Determinants of Health   Financial Resource Strain: Not on file  Food Insecurity: Not on file  Transportation Needs: Not on file  Physical Activity: Not on file  Stress: Not on file  Social Connections: Not on file      Family History: The patient's family history includes Alzheimer's disease in his father.  ROS:   Please see the history of present illness.     EKGs/Labs/Other Studies Reviewed:    The following studies were reviewed today:  Left Cardiac Catheterization 12/16/2018:  Ost LAD to Prox LAD lesion is 100% stenosed.  Post intervention, there is a 0% residual stenosis.  A stent was successfully placed.   Acute/Subacute thrombotic occlusion of the previously placed  3.0 x 20 mm Synergy  stent in the proximal LAD with clot extending almost to the LAD ostium with chest pain duration of at least 30 hours.  There was extensive recalcitrant thrombotic burden which persisted despite Angiomax, Aggrastat, multiple runs of Pronto thrombectomy, intracoronary nitroglycerin, intracoronary adenosine, distal infusion of adenosine, with ultimate complete resolution after insertion of an intra-aortic balloon pump for element of early cardiogenic shock and successful DES stenting of the LAD extending from the ostium to the mid vessel with insertion of a 3.5 x 38 mm Resolute stent postdilated to 3.75 mm with the 100% occlusion being reduced to 0% and after 3-1/2-hour procedure, ultimate reestablishment of brisk TIMI-3 flow antegrade down the LAD system which extends around the LV apex.  Normal left circumflex coronary artery.  Normal dominant RCA with initial faint collateralization of the LAD system via the distal RCA.  LVEDP at the start of the procedure was 30 mmHg.  Aggressive diuresis following Lasix 40 mg with ultimate 2.8 L of urine output during the procedure.  Recommendation: Continue Aggrastat for minimum of 18 hours post procedure.  Bivalirudin will be discontinued 2 hours post procedure.  We will plan to keep the intra-aortic balloon pump one-to-one today.  Obtain follow-up 2D echo Doppler study.  Recommend long-term dual antiplatelet therapy as long as there is no recurrent GI bleeds.  Will  assess drug screen with remote history of cocaine use.  Smoking cessation is essential.  Aggressive lipid-lowering therapy with target LDL less than 70.  Diagnostic Dominance: Right    Intervention     _______________  Echocardiogram 08/24/2020: Impressions: 1. EF 25-30% with RWMA consistent with prior LAD infarction. The LV is  moderately dilated compared with prior. MR is now severe. Mild to  moderate  AI with mild AS is present. AoV not well visualized.  2. The mid to apical anterior segments are akinetic. The apex is  akinetic. The mid to distal septum is akinetic. Findings are consistent  with prior LAD infarction. Left ventricular ejection fraction, by  estimation, is 25 to 30%. The left ventricle has  severely decreased function. The left ventricle demonstrates regional wall  motion abnormalities (see scoring diagram/findings for description). The  left ventricular internal cavity size was moderately dilated. Left  ventricular diastolic function could  not be evaluated.  3. Restricted PMVL in systole due to DCM (IIIB). 2D ERO 0.20 cm2 with  Rvol 27 cc. This is grossly underestimated due to oval shape of ERO. The  mitral valve is grossly normal. Severe mitral valve regurgitation.  4. Right ventricular systolic function is normal. The right ventricular  size is mildly enlarged. There is mildly elevated pulmonary artery  systolic pressure. The estimated right ventricular systolic pressure is  35.0 mmHg.  5. Left atrial size was severely dilated.  6. Right atrial size was mildly dilated.  7. The aortic valve was not well visualized. Aortic valve regurgitation  is mild to moderate. Mild aortic valve stenosis. Aortic valve mean  gradient measures 13.0 mmHg. Aortic valve Vmax measures 2.35 m/s.  8. The inferior vena cava is normal in size with greater than 50%  respiratory variability, suggesting right atrial pressure of 3 mmHg.   Comparison(s): Changes from prior  study are noted.   Conclusion(s)/Recommendation(s): Findings consistent with ischemic  cardiomyopathy.    EKG:  EKG not ordered today. EKG personally reviewed and demonstrates sinus tachycardia, rate 108 bpm, with isolated T wave inversions in lead aVL bu no other acute ST/T changes. Normal axis. Normal PR and QRS intervals. QTc 463 ms.  Recent Labs: 08/23/2020: B Natriuretic Peptide 1,451.1 08/25/2020: ALT 98; BUN 26; Creatinine, Ser 1.30; Hemoglobin 10.0; Magnesium 2.1; Platelets 362; Potassium 4.2; Sodium 138  Recent Lipid Panel    Component Value Date/Time   CHOL 144 12/17/2018 0237   TRIG 70 12/17/2018 0237   HDL 40 (L) 12/17/2018 0237   CHOLHDL 3.6 12/17/2018 0237   VLDL 14 12/17/2018 0237   LDLCALC 90 12/17/2018 0237    Physical Exam:    Vital Signs: BP 135/82   Pulse (!) 108   Ht 5\' 9"  (1.753 m)   Wt 159 lb 6.4 oz (72.3 kg)   SpO2 99%   BMI 23.54 kg/m     Wt Readings from Last 3 Encounters:  02/13/21 159 lb 6.4 oz (72.3 kg)  08/25/20 146 lb 1.6 oz (66.3 kg)  03/15/19 160 lb 3.2 oz (72.7 kg)     General: 55 y.o. male in no acute distress. HEENT: Normocephalic and atraumatic. Sclera clear.  Neck: Supple. No JVD. Heart: Tachycardic with regular rhythm. III/VI systolic murmurs. No gallops or rubs. Radial pulses 2+ and equal bilaterally. Lungs: No increased work of breathing. Clear to ausculation bilaterally. No wheezes, rhonchi, or rales.  Abdomen: Soft, non-distended, and non-tender to palpation.  MSK: Normal strength and tone for age.  Extremities: No lower extremity edema.    Skin: Warm and dry. Neuro: Alert and oriented x3. No focal deficits. Psych: Normal affect. Responds appropriately.  Assessment:    1. CAD in native artery   2. Ischemic cardiomyopathy   3. Chronic systolic CHF (congestive heart failure) ([place])   4. Severe mitral regurgitation   5. Essential hypertension   6. Hyperlipidemia LDL goal <70   7.  Polysubstance abuse ([place])   8.  Medication management     Plan:    CAD - S/p ventricular fibrillation arrest and STEMI in 04/2018 s/p DES to LAD and another STEMI in 11/2020 after non-compliance with Plavix s/p repeat DES to LAD. - No angina.  - Per last Cardiology note, he should be on Plavix monotherapy. He states he has been out of this for the past month and has been taking Aspirin instead. Will restart Plavix 75mg  daily. Recommended stopping Aspirin (no DAPT due to GI bleeds). Will check CBC today. - Continue beta-blocker and high-intensity statin.   Ischemic Cardiomyopathy Chronic Systolic CHF - Last Echo in 07/2020 LVEF of 25-30% with wall motion abnormalities consistent with prior LAD infarctions, mild AS, severe MR, and mildly elevated RVSP of 43.3mmHg. - Appears euvolemic on exam.  - Continue Lasix 40mg  daily.  - Will increase Coreg to 6.25mg  twice daily. - Continue Spironolactone 25mg  daily. - Will add Entresto 24-26mg  twice daily if renal function stable on labs today. - Discussed importance of daily weight and sodium restrictions.   - Will check CMET today.  Severe Mitral Regurgitation - Noted on last Echo in 07/2020. - Dr. Prescott Gum saw patient during last admission in 07/2020 and stated: "He should be considered for high risk mitral valve replacement repair but would need repeat right and left heart cath, TEE, as well as dental evaluation and probable extractions." - Will order TEE and R/LHC. Both procedures have been scheduled for 02/27/2021. Discussed this with DOD (Dr. Gwenlyn Found) who agreed. Will get patient back in to see CT Surgery after these tests. - Also advised patient that he will need to have a dental evaluation. He states he is already looking for a dentist.   Shared Decision Making/Informed Consent{ The risks [stroke (1 in 1000), death (1 in 63), kidney failure [usually temporary] (1 in 500), bleeding (1 in 200), allergic reaction [possibly serious] (1 in 200)], benefits (diagnostic support and  management of coronary artery disease) and alternatives of a cardiac catheterization were discussed in detail with Mr. Tinnon and he is willing to proceed.  The risks [esophageal damage, perforation (1:10,000 risk), bleeding, pharyngeal hematoma as well as other potential complications associated with conscious sedation including aspiration, arrhythmia, respiratory failure and death], benefits (treatment guidance and diagnostic support) and alternatives of a transesophageal echocardiogram were discussed in detail with Mr. Lesser and he is willing to proceed.   PAD - S/p left femoral enarterecotmy with vein patch angioplasty and then ultimate femoral artery byass at [facility] in 2016/2017. - He does report cramping in leg at rest. Seemed to improve with Pedialyte. Does not describe pain with walking to suggest claudication.  - Will check CMET and Mg given reports of cramps.  - Continue Plavix and high-intensity statin. - He does need repeat ultrasound of legs for follow-up. However, I did not have time to discuss this today given multiple issues above. Can discuss at follow-up visit.   Hypertension - BP relatively well controlled at 135/82. - Continue medications for CHF as above.  Hyperlipidemia - Last lipid panel in 11/2018: Total Cholesterol 144, Triglycerides 70, HDL 40, LDL 90. - LDL goal <70 given CAD.  - Continue Lipitor 40mg  daily.  - Will repeat lipid panel and CMET.  Polysubstance Abuse - History of tobacco, cocaine, THC, benzodiazepines, and amphetamine use.  - He continues to smoke 1-1.5 packs per day. He also reports occasional marijuana use but denies any cocaine, amphetamine, or heroine use (caretaker  confirms this). - Complete smoking cessation will be very important given history of CAD and PAD.  GERD - Patient has history of reflux. Previously on Protonix and request prescription of this. - Will prescribe Protonix 40mg  daily.  Disposition: Follow up in 1  month.   Medication Adjustments/Labs and Tests Ordered: Current medicines are reviewed at length with the patient today.  Concerns regarding medicines are outlined above.  Orders Placed This Encounter  Procedures  . Comprehensive Metabolic Panel (CMET)  . CBC  . Magnesium  . Lipid panel  . EKG 12-Lead   Meds ordered this encounter  Medications  . pantoprazole (PROTONIX) 40 MG tablet    Sig: Take 1 tablet (40 mg total) by mouth daily.    Dispense:  90 tablet    Refill:  3  . carvedilol (COREG) 6.25 MG tablet    Sig: Take 1 tablet (6.25 mg total) by mouth 2 (two) times daily with a meal.    Dispense:  90 tablet    Refill:  3  . clopidogrel (PLAVIX) 75 MG tablet    Sig: Take 1 tablet (75 mg total) by mouth daily.    Dispense:  90 tablet    Refill:  3    Patient Instructions  Medication Instructions:  RESTART- Clopidogrel 75 mg by mouth daily INCREASE- Carvedilol 6.25 mg by mouth twice a day START- Pantoprazole 40 mg by mouth daily  *If you need a refill on your cardiac medications before your next appointment, please call your pharmacy*   Lab Work: CBC, CMP, Magnesium and Fasting Lipids Today  If you have labs (blood work) drawn today and your tests are completely normal, you will receive your results only by: Marland Kitchen MyChart Message (if you have MyChart) OR . A paper copy in the mail If you have any lab test that is abnormal or we need to change your treatment, we will call you to review the results.   Testing/Procedures: Your physician has requested that you have a TEE. During a TEE, sound waves are used to create images of your heart. It provides your doctor with information about the size and shape of your heart and how well your heart's chambers and valves are working. In this test, a transducer is attached to the end of a flexible tube that's guided down your throat and into your esophagus (the tube leading from you mouth to your stomach) to get a more detailed image of  your heart. You are not awake for the procedure. Please see the instruction sheet given to you today. For further information please visit [email or web address].   Your physician has requested that you have a cardiac catheterization. Cardiac catheterization is used to diagnose and/or treat various heart conditions. Doctors may recommend this procedure for a number of different reasons. The most common reason is to evaluate chest pain. Chest pain can be a symptom of coronary artery disease (CAD), and cardiac catheterization can show whether plaque is narrowing or blocking your heart's arteries. This procedure is also used to evaluate the valves, as well as measure the blood flow and oxygen levels in different parts of your heart. For further information please visit [email or web address]. Please follow instruction sheet, as given.   Follow-Up: At [facility], you and your health needs are our priority.  As part of our continuing mission to provide you with exceptional heart care, we have created designated Provider Care Teams.  These Care Teams include your primary Cardiologist (physician) and Advanced Practice Providers (APPs -  Physician Assistants and Nurse Practitioners) who all work together to provide you with the care you need, when you need it.  We recommend signing up for the patient portal called "MyChart".  Sign up information is provided on this After Visit Summary.  MyChart is used to connect with patients for Virtual Visits (Telemedicine).  Patients are able to view lab/test results, encounter notes, upcoming appointments, etc.  Non-urgent messages can be sent to your provider as well.   To learn more about what you can do with MyChart, go to [email or web address].    Your next appointment:   1 month(s)  The format for your next appointment:   In Person  Provider:   You may see Shelva Majestic, MD or one of the following Advanced Practice Providers on your designated Care Team:    Almyra Deforest, PA-C  Fabian Sharp, [place] or   Roby Lofts, [place]    Other Instructions Dear Jeremiah Curci  You are scheduled for a TEE on May 4th with Dr. Gasper Sells.  Please arrive at the [facility] (Main Entrance A) at [facility]: [address] [place], [place] [address] at 12:30 am/pm. (1 hour prior to procedure unless lab work is needed; if lab work is needed arrive 1.5 hours ahead)  DIET: Nothing to eat or drink after midnight except a sip of water with medications (see medication instructions below)  Medication Instructions: Hold Furosemide  Come to:  (Lab option #1) Come to the lab at [place] between the hours of 8:00 am and 4:30 pm. You do not have to be fasting. (Lab option #2) Lab at an alternate location (Lab option #3) your lab work will be done at the hospital prior to your procedure - you will need to arrive 1  hours ahead of your procedure  COVID19 TEST:  This is a Drive Up Visit at [address] [place], [place] [address] Someone will direct you to the appropriate testing line. Stay in your car and someone will be with you shortly.  You must have a responsible person to drive you home and stay in the waiting area during your procedure. Failure to do so could result in cancellation.  Bring your insurance cards.  *Special Note: Every effort is made to have your procedure done on time. Occasionally there are emergencies that occur at the hospital that may cause delays. Please be patient if a delay does occur.      [facility] [address] [place] [address] Dept: [phone] Loc: [place]  02/13/2021  You are scheduled for a Cardiac Catheterization on Wednesday, May 4 with Dr. Shelva Majestic.  1. Please arrive at the [facility] (Main Entrance A) at [facility]: [address] [place], [place] [address] at 12:30 PM (This time  is two hours before your procedure to ensure your preparation). Free valet parking service is available.   Special note: Every effort is made to have your procedure done on time. Please understand that emergencies sometimes delay scheduled procedures.  2. Diet: Do not eat solid foods after midnight.  The patient may have clear liquids until 5am upon the day of the procedure.  3. Labs: You will need to have blood drawn on Monday, May 2 at [facility]  Open: 8am - 5pm (Lunch 12:30 - 1:30)   Phone: [phone]. You do not need to be fasting.  4. Medication instructions in preparation for your procedure:   Contrast Allergy: No   On the morning of your procedure, take your Plavix/Clopidogrel and any morning medicines NOT listed above.  You may use sips of water.  5. Plan for one night stay--bring personal belongings. 6. Bring a current list of your medications and current insurance cards. 7. You MUST have a responsible person to drive you home. 8. Someone MUST be with you the first 24 hours after you arrive home or your discharge will be delayed. 9. Please wear clothes that are easy to get on and off and wear slip-on shoes.  Thank you for allowing [place] to care for you!   -- [facility] Invasive Cardiovascular services     Signed, Eppie Gibson  02/13/2021 5:42 PM    [facility]

## 2021-02-11 NOTE — Progress Notes (Signed)
Cardiology Office Note:    Date:  02/13/2021   ID:  Howard Thompson, DOB Sep 25, 1966, MRN [id number]  PCP:  Howard Geralds, MD  Cardiologist:  Howard Majestic, MD  Electrophysiologist:  None   Referring MD: Howard Thompson, *   Chief Complaint: follow-up of CAD, CHF, and severe MR  History of Present Illness:    Howard Thompson is a 55 y.o. male with a history of CAD s/p ventricular fibrillation arrest and STEMI in 04/2018 s/p DES to LAD and another STEMI in 11/2020 after non-compliance with Plavix s/p repeat DES to LAD, ischemic cardiomyopathy/ chronic systolic CHF with EF of 23-76% on Echo in 07/2020, PAD s/p left femoral enarterecotmy with vein patch angioplasty and then ultimate femoral artery byass at [facility] in 2016/2017, hypertension, hyperlipidemia, COPD, GI bleed, myasthenia gravis s/p thymectomy, and polysubstance abuse (tobacco, cocaine, THC, benzodiazepines, and amphetamine use), and noncompliance who presents today for follow-up of CAD, CHF, and severe MR.  Patient present in 04/2018 with ventricular fibrillation arrest and STEMI and was found to have 100% occlusion of LAD which was treated to DES. Hospitalization was complicated by cardiogenic shock and ischemic cardiomyopathy with EF of 25-30%, GI bleed, acute renal failure requiring temporary dialysis, shock liver, acute encephalopathy, anemia, and duodenitis. EF prior to discharge improved to 40-45% and he did not require LifeVest at discharge. He was readmitted in 06/2018 with severe GI bleed with hemoglobin of 4.4. EGD showed a single non-bleeding angiodysplastic lesion in the duodenum with was treated with APC and clip.  Brilinta was changed to Plavix. Patient was instructed to follow-up with primary Cardiologist at [facility]. Patient was admitted in 11/2018 with late presenting STEMI after self discontinuing Plavix. LHC showed thrombotic occlusion of previously placed LAD stent with extensive thrombus burden. Intra-aortic  ballon pump was place given cardiogenic shock and patient underwent successful PCI with DES of LAD extending from the ostium to mid vessel. Echo showed LVEF of 25-30%. He was switched back to DAPT with Aspirin and Brilinta. He was again was loss to follow-up but was readmitted in in 12/2018 for acute GI bleed. Cardiology was consulted for assistance with DAPT - Aspirin was stopped but Brilinta was continued. Patient again did not follow-up with Cardiology after this admission.  He was last seen by Cardiology during an admission in 07/2020 for acute CHF. Echo showed LVEF of 25-30% with wall motion abnormalities consistent with prior LAD infarctions, mild AS, severe MR, and mildly elevated RVSP of 43.27mmHg. He was diuresed with IV Lasix and then transitioned to PO Lasix 40mg  daily. Beta-blocker and Spironolactone were continued with plans to add low dose Entresto as outpatient if BP allows. He was seen by Dr. Prescott Thompson during admission for further evaluation of severe MR. Per Dr. Lucianne Lei Thompson's note: "He should be considered for high risk mitral valve replacement repair but would need repeat right and left heart cath, TEE, as well as dental evaluation and probable extractions.  Patient can be referred back to our office as an outpatient to further discuss high risk mitral surgery and possible CABG pending results of left heart cath." Patient again never followed up with Cardiology following discharge.  Patient presents today for follow-up because his pharmacy would not refill his medications until he was seen. Here with a caretaker. This is the first time he has been seen in our office in the outpatient setting. Previously followed at [facility]. Overall, he is stable from a cardiac standpoint. He does have some chronic shortness  of breath with exertion but this is stable. He does not sleep well at but states this is due to insomnia. No orthopnea. He does describes waking up feeling short of breath but actually  sounds more like sleep apnea. Caretaker describes apneic episodes. He states he has he has known sleep apnea but has not tolerated CPAP in the past. Not interested in another sleep study at this time. No chest pain. He notes occasional flutter in his chest but denies any sustained palpitations. He occasionally has some lightheadedness/dizziness if he stands too quickly. No syncope.   He has been out of Plavix for the past month and has been taking Aspirin stead. However, he reports compliance with the rest of his medications including Lipitor, Coreg, Spironolactone, and Laxis.  He is eager to have valve surgery now. He states his mother lives in [place] and is sick. He is not sure how much time she has left so he would like to go to [place] soon and introduce her to her grandson. However, he would like to go ahead and start work-up for valve surgery.  Of note, he did mention that he had a bypass surgery in his left leg at [facility] in the past and has not followed by with anyone on this. Reviewed Care Everywhere after visit and he does have a history of PAD s/p  left femoral enarterecotmy with vein patch angioplasty and then ultimate femoral artery byass at [facility] in 2016/2017. He reports occasional cramping in his legs at rest. This seemed to improve some with Pedialyte. Does not really describe claudication.  He also reports reflux. Previously on Protonix which helped but no longer has a prescription of this. He also reports occasionally left lower quadrant pain. Sound like he has been diagnosed with a hernia in the past. Advised patient to follow-up with PCP.  Past Medical History:  Diagnosis Date  . Acute renal failure ([place])    a. a. during 7-05/2018 adm for cardiac arrest, requiring HD temporarily.  . Alcohol abuse   . Anemia   . AVM (arteriovenous malformation) of colon   . Cardiac arrest ([place]) 05/22/2018   Vfib  . CHF (congestive heart failure) ([place])   . Cocaine abuse ([place])   . COPD  (chronic obstructive pulmonary disease) ([place])   . Coronary artery disease    a. hx of POBA in [place]. b. out-of-hospital cardiac arrest 04/2018 (vfib/STEMI) s/p DES to prox LAD (complex admission with GIB, renal failure requiring HD, encephalopathy). c. STEMI 11/2018 after pt stopped Plavix - s/p DES to LAD.  Marland Kitchen Duodenitis   . GI bleed   . History of blood transfusion   . Hyperlipidemia   . Hypokalemia   . Hypomagnesemia   . Iron deficiency anemia   . Ischemic cardiomyopathy    a. EF <25% at time of cath/arrest 04/2018, improved to 40-45% by f/u echo 06/09/18.  . Myasthenia gravis ([place])   . Myocardial infarction ([place]) 05/22/2018   STEMI  . PAD (peripheral artery disease) ([place])    a. femoral bypass  . Shock liver    a. during 7-05/2018 adm for cardiac arrest and again in 11/2018.  Marland Kitchen Thrombocytopenia ([place]) 04/2018  . Tobacco abuse   . Ventricular fibrillation [facility])     Past Surgical History:  Procedure Laterality Date  . ATHERECTOMY    . BIOPSY  07/16/2018   Procedure: BIOPSY;  Surgeon: Gatha Mayer, MD;  Location: [place];  Service: Endoscopy;;  . CARDIAC CATHETERIZATION  05/22/2018   stent  . COLONOSCOPY WITH PROPOFOL N/A 07/16/2018   Procedure: COLONOSCOPY WITH PROPOFOL;  Surgeon: Gatha Mayer, MD;  Location: [facility] ENDOSCOPY;  Service: Endoscopy;  Laterality: N/A;  . CORONARY ANGIOPLASTY    . CORONARY STENT INTERVENTION N/A 05/22/2018   Procedure: CORONARY STENT INTERVENTION;  Surgeon: Lorretta Harp, MD;  Location: [place] CV LAB;  Service: Cardiovascular;  Laterality: N/A;  . CORONARY/GRAFT ACUTE MI REVASCULARIZATION N/A 12/16/2018   Procedure: CORONARY/GRAFT ACUTE MI REVASCULARIZATION;  Surgeon: Troy Sine, MD;  Location: [place] CV LAB;  Service: Cardiovascular;  Laterality: N/A;  . ENTEROSCOPY N/A 01/22/2019   Procedure: ENTEROSCOPY;  Surgeon: Thornton Park, MD;  Location: [place];  Service: Gastroenterology;  Laterality: N/A;  .  ESOPHAGOGASTRODUODENOSCOPY (EGD) WITH PROPOFOL N/A 06/10/2018   Procedure: ESOPHAGOGASTRODUODENOSCOPY (EGD) WITH PROPOFOL;  Surgeon: Ladene Artist, MD;  Location: [facility] ENDOSCOPY;  Service: Endoscopy;  Laterality: N/A;  . ESOPHAGOGASTRODUODENOSCOPY (EGD) WITH PROPOFOL N/A 07/16/2018   Procedure: ESOPHAGOGASTRODUODENOSCOPY (EGD) WITH PROPOFOL;  Surgeon: Gatha Mayer, MD;  Location: [place];  Service: Endoscopy;  Laterality: N/A;  . FEMORAL ARTERY - POPLITEAL ARTERY BYPASS GRAFT    . GIVENS CAPSULE STUDY N/A 01/20/2019   Procedure: GIVENS CAPSULE STUDY;  Surgeon: Gatha Mayer, MD;  Location: [facility] ENDOSCOPY;  Service: Endoscopy;  Laterality: N/A;  . HOT HEMOSTASIS N/A 07/16/2018   Procedure: HOT HEMOSTASIS (ARGON PLASMA COAGULATION/BICAP);  Surgeon: Gatha Mayer, MD;  Location: [facility] ENDOSCOPY;  Service: Endoscopy;  Laterality: N/A;  . HOT HEMOSTASIS N/A 01/22/2019   Procedure: HOT HEMOSTASIS (ARGON PLASMA COAGULATION/BICAP);  Surgeon: Thornton Park, MD;  Location: [place];  Service: Gastroenterology;  Laterality: N/A;  . IABP INSERTION N/A 12/16/2018   Procedure: IABP Insertion;  Surgeon: Troy Sine, MD;  Location: [place] CV LAB;  Service: Cardiovascular;  Laterality: N/A;  . IR FLUORO GUIDE CV LINE RIGHT  05/31/2018  . IR REMOVAL TUN CV CATH W/O FL  06/21/2018  . IR US GUIDE VASC ACCESS RIGHT  05/31/2018  . LEFT HEART CATH AND CORONARY ANGIOGRAPHY N/A 05/22/2018   Procedure: LEFT HEART CATH AND CORONARY ANGIOGRAPHY;  Surgeon: Lorretta Harp, MD;  Location: [place] CV LAB;  Service: Cardiovascular;  Laterality: N/A;  . LEFT HEART CATH AND CORONARY ANGIOGRAPHY N/A 12/16/2018   Procedure: LEFT HEART CATH AND CORONARY ANGIOGRAPHY;  Surgeon: Troy Sine, MD;  Location: [place] CV LAB;  Service: Cardiovascular;  Laterality: N/A;    Current Medications: Current Meds  Medication Sig  . atorvastatin (LIPITOR) 40 MG tablet Take 1 tablet (40 mg total) by mouth daily.  .  furosemide (LASIX) 40 MG tablet Take 1 tablet (40 mg total) by mouth daily.  . nitroGLYCERIN (NITROSTAT) 0.4 MG SL tablet Place 1 tablet (0.4 mg total) under the tongue every 5 (five) minutes as needed for chest pain (max 3 doses).  . pantoprazole (PROTONIX) 40 MG tablet Take 1 tablet (40 mg total) by mouth daily.  Marland Kitchen spironolactone (ALDACTONE) 25 MG tablet Take 1 tablet (25 mg total) by mouth daily.  . [DISCONTINUED] carvedilol (COREG) 3.125 MG tablet Take 1 tablet (3.125 mg total) by mouth 2 (two) times daily with a meal. Please schedule annual appt for refills. [phone]. 1st attempt.  . [DISCONTINUED] clopidogrel (PLAVIX) 75 MG tablet Take 1 tablet (75 mg total) by mouth daily.     Allergies:   Patient has no known allergies.   Social History   Socioeconomic History  . Marital status:  Divorced    Spouse name: Not on file  . Number of children: Not on file  . Years of education: Not on file  . Highest education level: Not on file  Occupational History    Employer: [employer]  Tobacco Use  . Smoking status: Current Every Day Smoker    Packs/day: 0.50    Types: Cigarettes  . Smokeless tobacco: Current User  Vaping Use  . Vaping Use: Never used  Substance and Sexual Activity  . Alcohol use: Yes    Comment: none in past several weeks, but normally about 5 beers a week, endorses binge drinking at celebrations  . Drug use: Yes    Types: Cocaine, Marijuana, Methamphetamines, Benzodiazepines    Comment: vaguely discusses useage, but quickly changes subject  . Sexual activity: Yes    Partners: Female  Other Topics Concern  . Not on file  Social History Narrative   Divorced   Employed at [employer]   Polysubstance abuse   Social Determinants of Health   Financial Resource Strain: Not on file  Food Insecurity: Not on file  Transportation Needs: Not on file  Physical Activity: Not on file  Stress: Not on file  Social Connections: Not on file      Family History: The patient's family history includes Alzheimer's disease in his father.  ROS:   Please see the history of present illness.     EKGs/Labs/Other Studies Reviewed:    The following studies were reviewed today:  Left Cardiac Catheterization 12/16/2018:  Ost LAD to Prox LAD lesion is 100% stenosed.  Post intervention, there is a 0% residual stenosis.  A stent was successfully placed.   Acute/Subacute thrombotic occlusion of the previously placed  3.0 x 20 mm Synergy  stent in the proximal LAD with clot extending almost to the LAD ostium with chest pain duration of at least 30 hours.  There was extensive recalcitrant thrombotic burden which persisted despite Angiomax, Aggrastat, multiple runs of Pronto thrombectomy, intracoronary nitroglycerin, intracoronary adenosine, distal infusion of adenosine, with ultimate complete resolution after insertion of an intra-aortic balloon pump for element of early cardiogenic shock and successful DES stenting of the LAD extending from the ostium to the mid vessel with insertion of a 3.5 x 38 mm Resolute stent postdilated to 3.75 mm with the 100% occlusion being reduced to 0% and after 3-1/2-hour procedure, ultimate reestablishment of brisk TIMI-3 flow antegrade down the LAD system which extends around the LV apex.  Normal left circumflex coronary artery.  Normal dominant RCA with initial faint collateralization of the LAD system via the distal RCA.  LVEDP at the start of the procedure was 30 mmHg.  Aggressive diuresis following Lasix 40 mg with ultimate 2.8 L of urine output during the procedure.  Recommendation: Continue Aggrastat for minimum of 18 hours post procedure.  Bivalirudin will be discontinued 2 hours post procedure.  We will plan to keep the intra-aortic balloon pump one-to-one today.  Obtain follow-up 2D echo Doppler study.  Recommend long-term dual antiplatelet therapy as long as there is no recurrent GI bleeds.  Will  assess drug screen with remote history of cocaine use.  Smoking cessation is essential.  Aggressive lipid-lowering therapy with target LDL less than 70.  Diagnostic Dominance: Right    Intervention     _______________  Echocardiogram 08/24/2020: Impressions: 1. EF 25-30% with RWMA consistent with prior LAD infarction. The LV is  moderately dilated compared with prior. MR is now severe. Mild to  moderate  AI with mild AS is present. AoV not well visualized.  2. The mid to apical anterior segments are akinetic. The apex is  akinetic. The mid to distal septum is akinetic. Findings are consistent  with prior LAD infarction. Left ventricular ejection fraction, by  estimation, is 25 to 30%. The left ventricle has  severely decreased function. The left ventricle demonstrates regional wall  motion abnormalities (see scoring diagram/findings for description). The  left ventricular internal cavity size was moderately dilated. Left  ventricular diastolic function could  not be evaluated.  3. Restricted PMVL in systole due to DCM (IIIB). 2D ERO 0.20 cm2 with  Rvol 27 cc. This is grossly underestimated due to oval shape of ERO. The  mitral valve is grossly normal. Severe mitral valve regurgitation.  4. Right ventricular systolic function is normal. The right ventricular  size is mildly enlarged. There is mildly elevated pulmonary artery  systolic pressure. The estimated right ventricular systolic pressure is  94.1 mmHg.  5. Left atrial size was severely dilated.  6. Right atrial size was mildly dilated.  7. The aortic valve was not well visualized. Aortic valve regurgitation  is mild to moderate. Mild aortic valve stenosis. Aortic valve mean  gradient measures 13.0 mmHg. Aortic valve Vmax measures 2.35 m/s.  8. The inferior vena cava is normal in size with greater than 50%  respiratory variability, suggesting right atrial pressure of 3 mmHg.   Comparison(s): Changes from prior  study are noted.   Conclusion(s)/Recommendation(s): Findings consistent with ischemic  cardiomyopathy.    EKG:  EKG not ordered today. EKG personally reviewed and demonstrates sinus tachycardia, rate 108 bpm, with isolated T wave inversions in lead aVL bu no other acute ST/T changes. Normal axis. Normal PR and QRS intervals. QTc 463 ms.  Recent Labs: 08/23/2020: B Natriuretic Peptide 1,451.1 08/25/2020: ALT 98; BUN 26; Creatinine, Ser 1.30; Hemoglobin 10.0; Magnesium 2.1; Platelets 362; Potassium 4.2; Sodium 138  Recent Lipid Panel    Component Value Date/Time   CHOL 144 12/17/2018 0237   TRIG 70 12/17/2018 0237   HDL 40 (L) 12/17/2018 0237   CHOLHDL 3.6 12/17/2018 0237   VLDL 14 12/17/2018 0237   LDLCALC 90 12/17/2018 0237    Physical Exam:    Vital Signs: BP 135/82   Pulse (!) 108   Ht 5\' 9"  (1.753 m)   Wt 159 lb 6.4 oz (72.3 kg)   SpO2 99%   BMI 23.54 kg/m     Wt Readings from Last 3 Encounters:  02/13/21 159 lb 6.4 oz (72.3 kg)  08/25/20 146 lb 1.6 oz (66.3 kg)  03/15/19 160 lb 3.2 oz (72.7 kg)     General: 55 y.o. male in no acute distress. HEENT: Normocephalic and atraumatic. Sclera clear.  Neck: Supple. No JVD. Heart: Tachycardic with regular rhythm. III/VI systolic murmurs. No gallops or rubs. Radial pulses 2+ and equal bilaterally. Lungs: No increased work of breathing. Clear to ausculation bilaterally. No wheezes, rhonchi, or rales.  Abdomen: Soft, non-distended, and non-tender to palpation.  MSK: Normal strength and tone for age.  Extremities: No lower extremity edema.    Skin: Warm and dry. Neuro: Alert and oriented x3. No focal deficits. Psych: Normal affect. Responds appropriately.  Assessment:    1. CAD in native artery   2. Ischemic cardiomyopathy   3. Chronic systolic CHF (congestive heart failure) ([place])   4. Severe mitral regurgitation   5. Essential hypertension   6. Hyperlipidemia LDL goal <70   7.  Polysubstance abuse ([place])   8.  Medication management     Plan:    CAD - S/p ventricular fibrillation arrest and STEMI in 04/2018 s/p DES to LAD and another STEMI in 11/2020 after non-compliance with Plavix s/p repeat DES to LAD. - No angina.  - Per last Cardiology note, he should be on Plavix monotherapy. He states he has been out of this for the past month and has been taking Aspirin instead. Will restart Plavix 75mg  daily. Recommended stopping Aspirin (no DAPT due to GI bleeds). Will check CBC today. - Continue beta-blocker and high-intensity statin.   Ischemic Cardiomyopathy Chronic Systolic CHF - Last Echo in 07/2020 LVEF of 25-30% with wall motion abnormalities consistent with prior LAD infarctions, mild AS, severe MR, and mildly elevated RVSP of 43.58mmHg. - Appears euvolemic on exam.  - Continue Lasix 40mg  daily.  - Will increase Coreg to 6.25mg  twice daily. - Continue Spironolactone 25mg  daily. - Will add Entresto 24-26mg  twice daily if renal function stable on labs today. - Discussed importance of daily weight and sodium restrictions.   - Will check CMET today.  Severe Mitral Regurgitation - Noted on last Echo in 07/2020. - Dr. Prescott Thompson saw patient during last admission in 07/2020 and stated: "He should be considered for high risk mitral valve replacement repair but would need repeat right and left heart cath, TEE, as well as dental evaluation and probable extractions." - Will order TEE and R/LHC. Both procedures have been scheduled for 02/27/2021. Discussed this with DOD (Dr. Gwenlyn Found) who agreed. Will get patient back in to see CT Surgery after these tests. - Also advised patient that he will need to have a dental evaluation. He states he is already looking for a dentist.   Shared Decision Making/Informed Consent{ The risks [stroke (1 in 1000), death (1 in 68), kidney failure [usually temporary] (1 in 500), bleeding (1 in 200), allergic reaction [possibly serious] (1 in 200)], benefits (diagnostic support and  management of coronary artery disease) and alternatives of a cardiac catheterization were discussed in detail with Howard Thompson and he is willing to proceed.  The risks [esophageal damage, perforation (1:10,000 risk), bleeding, pharyngeal hematoma as well as other potential complications associated with conscious sedation including aspiration, arrhythmia, respiratory failure and death], benefits (treatment guidance and diagnostic support) and alternatives of a transesophageal echocardiogram were discussed in detail with Howard Thompson and he is willing to proceed.   PAD - S/p left femoral enarterecotmy with vein patch angioplasty and then ultimate femoral artery byass at [facility] in 2016/2017. - He does report cramping in leg at rest. Seemed to improve with Pedialyte. Does not describe pain with walking to suggest claudication.  - Will check CMET and Mg given reports of cramps.  - Continue Plavix and high-intensity statin. - He does need repeat ultrasound of legs for follow-up. However, I did not have time to discuss this today given multiple issues above. Can discuss at follow-up visit.   Hypertension - BP relatively well controlled at 135/82. - Continue medications for CHF as above.  Hyperlipidemia - Last lipid panel in 11/2018: Total Cholesterol 144, Triglycerides 70, HDL 40, LDL 90. - LDL goal <70 given CAD.  - Continue Lipitor 40mg  daily.  - Will repeat lipid panel and CMET.  Polysubstance Abuse - History of tobacco, cocaine, THC, benzodiazepines, and amphetamine use.  - He continues to smoke 1-1.5 packs per day. He also reports occasional marijuana use but denies any cocaine, amphetamine, or heroine use (caretaker  confirms this). - Complete smoking cessation will be very important given history of CAD and PAD.  GERD - Patient has history of reflux. Previously on Protonix and request prescription of this. - Will prescribe Protonix 40mg  daily.  Disposition: Follow up in 1  month.   Medication Adjustments/Labs and Tests Ordered: Current medicines are reviewed at length with the patient today.  Concerns regarding medicines are outlined above.  Orders Placed This Encounter  Procedures  . Comprehensive Metabolic Panel (CMET)  . CBC  . Magnesium  . Lipid panel  . EKG 12-Lead   Meds ordered this encounter  Medications  . pantoprazole (PROTONIX) 40 MG tablet    Sig: Take 1 tablet (40 mg total) by mouth daily.    Dispense:  90 tablet    Refill:  3  . carvedilol (COREG) 6.25 MG tablet    Sig: Take 1 tablet (6.25 mg total) by mouth 2 (two) times daily with a meal.    Dispense:  90 tablet    Refill:  3  . clopidogrel (PLAVIX) 75 MG tablet    Sig: Take 1 tablet (75 mg total) by mouth daily.    Dispense:  90 tablet    Refill:  3    Patient Instructions  Medication Instructions:  RESTART- Clopidogrel 75 mg by mouth daily INCREASE- Carvedilol 6.25 mg by mouth twice a day START- Pantoprazole 40 mg by mouth daily  *If you need a refill on your cardiac medications before your next appointment, please call your pharmacy*   Lab Work: CBC, CMP, Magnesium and Fasting Lipids Today  If you have labs (blood work) drawn today and your tests are completely normal, you will receive your results only by: Marland Kitchen MyChart Message (if you have MyChart) OR . A paper copy in the mail If you have any lab test that is abnormal or we need to change your treatment, we will call you to review the results.   Testing/Procedures: Your physician has requested that you have a TEE. During a TEE, sound waves are used to create images of your heart. It provides your doctor with information about the size and shape of your heart and how well your heart's chambers and valves are working. In this test, a transducer is attached to the end of a flexible tube that's guided down your throat and into your esophagus (the tube leading from you mouth to your stomach) to get a more detailed image of  your heart. You are not awake for the procedure. Please see the instruction sheet given to you today. For further information please visit [email or web address].   Your physician has requested that you have a cardiac catheterization. Cardiac catheterization is used to diagnose and/or treat various heart conditions. Doctors may recommend this procedure for a number of different reasons. The most common reason is to evaluate chest pain. Chest pain can be a symptom of coronary artery disease (CAD), and cardiac catheterization can show whether plaque is narrowing or blocking your heart's arteries. This procedure is also used to evaluate the valves, as well as measure the blood flow and oxygen levels in different parts of your heart. For further information please visit [email or web address]. Please follow instruction sheet, as given.   Follow-Up: At [facility], you and your health needs are our priority.  As part of our continuing mission to provide you with exceptional heart care, we have created designated Provider Care Teams.  These Care Teams include your primary Cardiologist (physician) and Advanced Practice Providers (APPs -  Physician Assistants and Nurse Practitioners) who all work together to provide you with the care you need, when you need it.  We recommend signing up for the patient portal called "MyChart".  Sign up information is provided on this After Visit Summary.  MyChart is used to connect with patients for Virtual Visits (Telemedicine).  Patients are able to view lab/test results, encounter notes, upcoming appointments, etc.  Non-urgent messages can be sent to your provider as well.   To learn more about what you can do with MyChart, go to [email or web address].    Your next appointment:   1 month(s)  The format for your next appointment:   In Person  Provider:   You may see Howard Majestic, MD or one of the following Advanced Practice Providers on your designated Care Team:    Almyra Deforest, PA-C  Fabian Sharp, [place] or   Roby Lofts, [place]    Other Instructions Dear Howard Thompson  You are scheduled for a TEE on May 4th with Dr. Gasper Sells.  Please arrive at the [facility] (Main Entrance A) at [facility]: [address] [place], [place] [address] at 12:30 am/pm. (1 hour prior to procedure unless lab work is needed; if lab work is needed arrive 1.5 hours ahead)  DIET: Nothing to eat or drink after midnight except a sip of water with medications (see medication instructions below)  Medication Instructions: Hold Furosemide  Come to:  (Lab option #1) Come to the lab at [place] between the hours of 8:00 am and 4:30 pm. You do not have to be fasting. (Lab option #2) Lab at an alternate location (Lab option #3) your lab work will be done at the hospital prior to your procedure - you will need to arrive 1  hours ahead of your procedure  COVID19 TEST:  This is a Drive Up Visit at [address] [place], [place] [address] Someone will direct you to the appropriate testing line. Stay in your car and someone will be with you shortly.  You must have a responsible person to drive you home and stay in the waiting area during your procedure. Failure to do so could result in cancellation.  Bring your insurance cards.  *Special Note: Every effort is made to have your procedure done on time. Occasionally there are emergencies that occur at the hospital that may cause delays. Please be patient if a delay does occur.      [place] [address] [place] [address] [place] [address] Dept: [phone] Loc: [place]  02/13/2021  You are scheduled for a Cardiac Catheterization on Wednesday, May 4 with Dr. Shelva Thompson.  1. Please arrive at the [facility] (Main Entrance A) at [facility]: [address] [place], [place] [address] at 12:30 PM (This time  is two hours before your procedure to ensure your preparation). Free valet parking service is available.   Special note: Every effort is made to have your procedure done on time. Please understand that emergencies sometimes delay scheduled procedures.  2. Diet: Do not eat solid foods after midnight.  The patient may have clear liquids until 5am upon the day of the procedure.  3. Labs: You will need to have blood drawn on Monday, May 2 at [place]  Open: 8am - 5pm (Lunch 12:30 - 1:30)   Phone: [phone]. You do not need to be fasting.  4. Medication instructions in preparation for your procedure:   Contrast Allergy: No   On the morning of your procedure, take your Plavix/Clopidogrel and any morning medicines NOT listed above.  You may use sips of water.  5. Plan for one night stay--bring personal belongings. 6. Bring a current list of your medications and current insurance cards. 7. You MUST have a responsible person to drive you home. 8. Someone MUST be with you the first 24 hours after you arrive home or your discharge will be delayed. 9. Please wear clothes that are easy to get on and off and wear slip-on shoes.  Thank you for allowing [place] to care for you!   -- [facility] Invasive Cardiovascular services     Signed, Eppie Gibson  02/13/2021 5:42 PM    [facility]

## 2021-02-13 ENCOUNTER — Other Ambulatory Visit: Payer: Self-pay

## 2021-02-13 ENCOUNTER — Ambulatory Visit: Payer: No Typology Code available for payment source | Admitting: Student

## 2021-02-13 ENCOUNTER — Encounter: Payer: Self-pay | Admitting: Student

## 2021-02-13 VITALS — BP 135/82 | HR 108 | Ht 69.0 in | Wt 159.4 lb

## 2021-02-13 DIAGNOSIS — I2510 Atherosclerotic heart disease of native coronary artery without angina pectoris: Secondary | ICD-10-CM | POA: Diagnosis not present

## 2021-02-13 DIAGNOSIS — I255 Ischemic cardiomyopathy: Secondary | ICD-10-CM

## 2021-02-13 DIAGNOSIS — I5022 Chronic systolic (congestive) heart failure: Secondary | ICD-10-CM | POA: Diagnosis not present

## 2021-02-13 DIAGNOSIS — I739 Peripheral vascular disease, unspecified: Secondary | ICD-10-CM

## 2021-02-13 DIAGNOSIS — K219 Gastro-esophageal reflux disease without esophagitis: Secondary | ICD-10-CM

## 2021-02-13 DIAGNOSIS — I10 Essential (primary) hypertension: Secondary | ICD-10-CM

## 2021-02-13 DIAGNOSIS — I340 Nonrheumatic mitral (valve) insufficiency: Secondary | ICD-10-CM

## 2021-02-13 DIAGNOSIS — F1910 Other psychoactive substance abuse, uncomplicated: Secondary | ICD-10-CM

## 2021-02-13 DIAGNOSIS — E785 Hyperlipidemia, unspecified: Secondary | ICD-10-CM

## 2021-02-13 DIAGNOSIS — Z79899 Other long term (current) drug therapy: Secondary | ICD-10-CM

## 2021-02-13 MED ORDER — PANTOPRAZOLE SODIUM 40 MG PO TBEC: 40.0000 mg | DELAYED_RELEASE_TABLET | Freq: Every day | ORAL | 3 refills | Status: DC

## 2021-02-13 MED ORDER — CARVEDILOL 6.25 MG PO TABS: 6.2500 mg | ORAL_TABLET | Freq: Two times a day (BID) | ORAL | 3 refills | Status: DC

## 2021-02-13 MED ORDER — CLOPIDOGREL BISULFATE 75 MG PO TABS: 75.0000 mg | ORAL_TABLET | Freq: Every day | ORAL | 3 refills | Status: DC

## 2021-02-13 NOTE — Patient Instructions (Addendum)
Medication Instructions:  RESTART- Clopidogrel 75 mg by mouth daily INCREASE- Carvedilol 6.25 mg by mouth twice a day START- Pantoprazole 40 mg by mouth daily  *If you need a refill on your cardiac medications before your next appointment, please call your pharmacy*   Lab Work: CBC, CMP, Magnesium and Fasting Lipids Today  If you have labs (blood work) drawn today and your tests are completely normal, you will receive your results only by: Marland Kitchen MyChart Message (if you have MyChart) OR . A paper copy in the mail If you have any lab test that is abnormal or we need to change your treatment, we will call you to review the results.   Testing/Procedures: Your physician has requested that you have a TEE. During a TEE, sound waves are used to create images of your heart. It provides your doctor with information about the size and shape of your heart and how well your heart's chambers and valves are working. In this test, a transducer is attached to the end of a flexible tube that's guided down your throat and into your esophagus (the tube leading from you mouth to your stomach) to get a more detailed image of your heart. You are not awake for the procedure. Please see the instruction sheet given to you today. For further information please visit [email or web address].   Your physician has requested that you have a cardiac catheterization. Cardiac catheterization is used to diagnose and/or treat various heart conditions. Doctors may recommend this procedure for a number of different reasons. The most common reason is to evaluate chest pain. Chest pain can be a symptom of coronary artery disease (CAD), and cardiac catheterization can show whether plaque is narrowing or blocking your heart's arteries. This procedure is also used to evaluate the valves, as well as measure the blood flow and oxygen levels in different parts of your heart. For further information please visit [email or web address]. Please follow  instruction sheet, as given.   Follow-Up: At [facility], you and your health needs are our priority.  As part of our continuing mission to provide you with exceptional heart care, we have created designated Provider Care Teams.  These Care Teams include your primary Cardiologist (physician) and Advanced Practice Providers (APPs -  Physician Assistants and Nurse Practitioners) who all work together to provide you with the care you need, when you need it.  We recommend signing up for the patient portal called "MyChart".  Sign up information is provided on this After Visit Summary.  MyChart is used to connect with patients for Virtual Visits (Telemedicine).  Patients are able to view lab/test results, encounter notes, upcoming appointments, etc.  Non-urgent messages can be sent to your provider as well.   To learn more about what you can do with MyChart, go to [email or web address].    Your next appointment:   1 month(s)  The format for your next appointment:   In Person  Provider:   You may see Shelva Majestic, MD or one of the following Advanced Practice Providers on your designated Care Team:    Almyra Deforest, PA-C  Fabian Sharp, [place] or   Roby Lofts, [place]    Other Instructions Dear Howard Thompson  You are scheduled for a TEE on May 4th with Dr. Gasper Sells.  Please arrive at the [facility] (Main Entrance A) at [facility]: [address] [place], [place] [address] at 12:30 am/pm. (1 hour prior to procedure unless lab work is needed; if lab work is needed arrive  1.5 hours ahead)  DIET: Nothing to eat or drink after midnight except a sip of water with medications (see medication instructions below)  Medication Instructions: Hold Furosemide  Come to:  (Lab option #1) Come to the lab at [place] between the hours of 8:00 am and 4:30 pm. You do not have to be fasting. (Lab option #2) Lab at an alternate location (Lab option #3) your lab work will be done at the  hospital prior to your procedure - you will need to arrive 1  hours ahead of your procedure  COVID19 TEST:  This is a Drive Up Visit at [address] [place], [place] [address] Someone will direct you to the appropriate testing line. Stay in your car and someone will be with you shortly.  You must have a responsible person to drive you home and stay in the waiting area during your procedure. Failure to do so could result in cancellation.  Bring your insurance cards.  *Special Note: Every effort is made to have your procedure done on time. Occasionally there are emergencies that occur at the hospital that may cause delays. Please be patient if a delay does occur.      [place] [address] [place] [address] [place] [address] Dept: [phone] Loc: [place]  02/13/2021  You are scheduled for a Cardiac Catheterization on Wednesday, May 4 with Dr. Shelva Majestic.  1. Please arrive at the [facility] (Main Entrance A) at [facility]: [address] [place], [place] [address] at 12:30 PM (This time is two hours before your procedure to ensure your preparation). Free valet parking service is available.   Special note: Every effort is made to have your procedure done on time. Please understand that emergencies sometimes delay scheduled procedures.  2. Diet: Do not eat solid foods after midnight.  The patient may have clear liquids until 5am upon the day of the procedure.  3. Labs: You will need to have blood drawn on Monday, May 2 at [place]  Open: 8am - 5pm (Lunch 12:30 - 1:30)   Phone: [phone]. You do not need to be fasting.  4. Medication instructions in preparation for your procedure:   Contrast Allergy: No   On the morning of your procedure, take your Plavix/Clopidogrel and any morning medicines NOT listed above.  You may use sips  of water.  5. Plan for one night stay--bring personal belongings. 6. Bring a current list of your medications and current insurance cards. 7. You MUST have a responsible person to drive you home. 8. Someone MUST be with you the first 24 hours after you arrive home or your discharge will be delayed. 9. Please wear clothes that are easy to get on and off and wear slip-on shoes.  Thank you for allowing [place] to care for you!   -- [place] Invasive Cardiovascular services

## 2021-02-14 LAB — COMPREHENSIVE METABOLIC PANEL
ALT: 14 IU/L (ref 0–44)
AST: 20 IU/L (ref 0–40)
Albumin/Globulin Ratio: 1.7 (ref 1.2–2.2)
Albumin: 4.8 g/dL (ref 3.8–4.9)
Alkaline Phosphatase: 132 IU/L — ABNORMAL HIGH (ref 44–121)
BUN/Creatinine Ratio: 18 (ref 9–20)
BUN: 21 mg/dL (ref 6–24)
Bilirubin Total: 0.8 mg/dL (ref 0.0–1.2)
CO2: 22 mmol/L (ref 20–29)
Calcium: 9.8 mg/dL (ref 8.7–10.2)
Chloride: 100 mmol/L (ref 96–106)
Creatinine, Ser: 1.17 mg/dL (ref 0.76–1.27)
Globulin, Total: 2.8 g/dL (ref 1.5–4.5)
Glucose: 85 mg/dL (ref 65–99)
Potassium: 4.9 mmol/L (ref 3.5–5.2)
Sodium: 137 mmol/L (ref 134–144)
Total Protein: 7.6 g/dL (ref 6.0–8.5)
eGFR: 74 mL/min/{1.73_m2} (ref >59)

## 2021-02-14 LAB — CBC
Hematocrit: 34.1 % — ABNORMAL LOW (ref 37.5–51.0)
Hemoglobin: 9.4 g/dL — ABNORMAL LOW (ref 13.0–17.7)
MCH: 18.8 pg — ABNORMAL LOW (ref 26.6–33.0)
MCHC: 27.6 g/dL — ABNORMAL LOW (ref 31.5–35.7)
MCV: 68 fL — ABNORMAL LOW (ref 79–97)
Platelets: 413 10*3/uL (ref 150–450)
RBC: 5 x10E6/uL (ref 4.14–5.80)
RDW: 19.3 % — ABNORMAL HIGH (ref 11.6–15.4)
WBC: 5.5 10*3/uL (ref 3.4–10.8)

## 2021-02-14 LAB — LIPID PANEL
Chol/HDL Ratio: 3.4 ratio (ref 0.0–5.0)
Cholesterol, Total: 124 mg/dL (ref 100–199)
HDL: 36 mg/dL — ABNORMAL LOW (ref >39)
LDL Chol Calc (NIH): 78 mg/dL (ref 0–99)
Triglycerides: 42 mg/dL (ref 0–149)
VLDL Cholesterol Cal: 10 mg/dL (ref 5–40)

## 2021-02-14 LAB — MAGNESIUM: Magnesium: 2.1 mg/dL (ref 1.6–2.3)

## 2021-02-15 ENCOUNTER — Encounter: Payer: Self-pay | Admitting: *Deleted

## 2021-02-15 NOTE — Telephone Encounter (Signed)
-----   Message from Darreld Mclean, [place] sent at 02/14/2021 11:35 AM EDT ----- Please notify patient of results: Kidney function and electrolytes normal. I would like to add Entresto 24-26mg  twice daily for his CHF. Bad cholesterol (LDL) slightly above goal. If patient willing would like to increase Lipitor to 80mg  daily. ALT (one of liver enzymes) slightly elevated but other liver enzymes normal. Nothing to do about this now but we can continue to monitor it. Hemoglobin is slightly lower than it was in October but overall stable. He should continue to monitor for signs of bleeding in urine or stools.  - He will need repeat BMET and CBC in 1 week (follow-up after starting Entresto as well as pre-procedural labs for TEE on 02/27/2021). - He will also need repeat lipid panel and LFTs in 6 weeks if he is agreeable to increasing Lipitor.  He needs follow-up with Dr. Claiborne Billings or APP in about 1 month.  Thank you!

## 2021-02-15 NOTE — Telephone Encounter (Signed)
Unable to reach pt or leave a message mailbox is full This encounter was created in error - please disregard.

## 2021-02-20 ENCOUNTER — Other Ambulatory Visit: Payer: Self-pay

## 2021-02-20 ENCOUNTER — Encounter: Payer: Self-pay | Admitting: *Deleted

## 2021-02-20 MED ORDER — SPIRONOLACTONE 25 MG PO TABS: 25.0000 mg | ORAL_TABLET | Freq: Every day | ORAL | 0 refills | Status: DC

## 2021-02-20 MED ORDER — FUROSEMIDE 40 MG PO TABS: 40.0000 mg | ORAL_TABLET | Freq: Every day | ORAL | 0 refills | Status: DC

## 2021-02-20 MED ORDER — ATORVASTATIN CALCIUM 40 MG PO TABS: 40.0000 mg | ORAL_TABLET | Freq: Every day | ORAL | 0 refills | Status: DC

## 2021-02-21 ENCOUNTER — Telehealth: Payer: Self-pay | Admitting: *Deleted

## 2021-02-21 DIAGNOSIS — I340 Nonrheumatic mitral (valve) insufficiency: Secondary | ICD-10-CM

## 2021-02-21 DIAGNOSIS — Z01812 Encounter for preprocedural laboratory examination: Secondary | ICD-10-CM

## 2021-02-21 MED ORDER — ENTRESTO 24-26 MG PO TABS
1.0000 | ORAL_TABLET | Freq: Two times a day (BID) | ORAL | 11 refills | Status: DC
Start: 2021-02-21 — End: 2021-02-25

## 2021-02-21 NOTE — Telephone Encounter (Signed)
CBC and BMP ordered and mailed to pt Entresto 24/26 send to pt pharmacy

## 2021-02-21 NOTE — Telephone Encounter (Signed)
-----   Message from Darreld Mclean, [place] sent at 02/14/2021  3:31 PM EDT ----- Please see note below.   Thank you so much for your help!

## 2021-02-21 NOTE — Progress Notes (Signed)
Letter with result mailed to pt 

## 2021-02-25 ENCOUNTER — Telehealth: Payer: Self-pay | Admitting: *Deleted

## 2021-02-25 ENCOUNTER — Other Ambulatory Visit (HOSPITAL_COMMUNITY)
Admission: RE | Admit: 2021-02-25 | Discharge: 2021-02-25 | Disposition: A | Discharge status: Home / Self Care | Payer: No Typology Code available for payment source | Source: Ambulatory Visit | Attending: Internal Medicine | Admitting: Internal Medicine

## 2021-02-25 ENCOUNTER — Other Ambulatory Visit: Payer: Self-pay | Admitting: *Deleted

## 2021-02-25 DIAGNOSIS — Z20822 Contact with and (suspected) exposure to covid-19: Secondary | ICD-10-CM | POA: Diagnosis not present

## 2021-02-25 DIAGNOSIS — Z01812 Encounter for preprocedural laboratory examination: Secondary | ICD-10-CM | POA: Diagnosis present

## 2021-02-25 LAB — CBC
Hematocrit: 30.2 % — ABNORMAL LOW (ref 37.5–51.0)
Hemoglobin: 8.3 g/dL — ABNORMAL LOW (ref 13.0–17.7)
MCH: 18.3 pg — ABNORMAL LOW (ref 26.6–33.0)
MCHC: 27.5 g/dL — ABNORMAL LOW (ref 31.5–35.7)
MCV: 67 fL — ABNORMAL LOW (ref 79–97)
Platelets: 269 10*3/uL (ref 150–450)
RBC: 4.53 x10E6/uL (ref 4.14–5.80)
RDW: 18.8 % — ABNORMAL HIGH (ref 11.6–15.4)
WBC: 6.8 10*3/uL (ref 3.4–10.8)

## 2021-02-25 LAB — BASIC METABOLIC PANEL
BUN/Creatinine Ratio: 13 (ref 9–20)
BUN: 13 mg/dL (ref 6–24)
CO2: 22 mmol/L (ref 20–29)
Calcium: 9.4 mg/dL (ref 8.7–10.2)
Chloride: 105 mmol/L (ref 96–106)
Creatinine, Ser: 1.02 mg/dL (ref 0.76–1.27)
Glucose: 95 mg/dL (ref 65–99)
Potassium: 4.9 mmol/L (ref 3.5–5.2)
Sodium: 139 mmol/L (ref 134–144)
eGFR: 87 mL/min/{1.73_m2} (ref >59)

## 2021-02-25 MED ORDER — ENTRESTO 24-26 MG PO TABS: 1.0000 | ORAL_TABLET | Freq: Two times a day (BID) | ORAL | 1 refills | Status: DC

## 2021-02-25 MED ORDER — SPIRONOLACTONE 25 MG PO TABS: 25.0000 mg | ORAL_TABLET | Freq: Every day | ORAL | 1 refills | Status: DC

## 2021-02-25 MED ORDER — ATORVASTATIN CALCIUM 40 MG PO TABS: 40.0000 mg | ORAL_TABLET | Freq: Every day | ORAL | 1 refills | Status: DC

## 2021-02-25 NOTE — Telephone Encounter (Signed)
Pt contacted pre-catheterization scheduled at [facility] for: Wednesday Feb 27, 2021 2:30 PM/TEE 1:30 PM Verified arrival time and place: [facility]) at: 12:30 PM   No solid food after midnight prior to cath, clear liquids until 5 AM day of procedure.  Hold: Lasix-AM of procedure Spironolactone -AM of procedure  Except hold medications AM meds can be  taken pre-cath with sips of water including: Plavix 75 mg   Confirmed patient has responsible adult to drive home post procedure and be with patient first 24 hours after arriving home: yes  You are allowed ONE visitor in the waiting room during the time you are at the hospital for your procedure. Both you and your visitor must wear a mask once you enter the hospital.   Reviewed procedure/mask/visitor instructions with patient.

## 2021-02-25 NOTE — Telephone Encounter (Signed)
Patient walked in wanting refills and pharmacy updated  Now uses [facility] [place]  Refilled as requested

## 2021-02-26 LAB — SARS CORONAVIRUS 2 (TAT 6-24 HRS): SARS Coronavirus 2: NEGATIVE

## 2021-02-26 NOTE — Telephone Encounter (Signed)
02/25/21 Hgb 8.3 -see 02/25/21 CBC results. Call placed to patient to discuss results and ask patient to be at [facility] tomorrow at 12 Noon (30 minutes earlier than previous time) to allow time to get CBC prior to procedures.  Received message at phone number listed for pt, voice mailbox full, unable to leave a message.

## 2021-02-27 ENCOUNTER — Encounter (HOSPITAL_COMMUNITY): Payer: Self-pay | Admitting: Internal Medicine

## 2021-02-27 ENCOUNTER — Ambulatory Visit (HOSPITAL_BASED_OUTPATIENT_CLINIC_OR_DEPARTMENT_OTHER): Payer: No Typology Code available for payment source

## 2021-02-27 ENCOUNTER — Other Ambulatory Visit (HOSPITAL_COMMUNITY): Payer: Self-pay | Admitting: *Deleted

## 2021-02-27 ENCOUNTER — Ambulatory Visit (HOSPITAL_COMMUNITY): Payer: No Typology Code available for payment source | Admitting: Registered Nurse

## 2021-02-27 ENCOUNTER — Ambulatory Visit (HOSPITAL_COMMUNITY)
Admission: RE | Disposition: A | Discharge status: Home / Self Care | Payer: Self-pay | Source: Home / Self Care | Attending: Internal Medicine

## 2021-02-27 ENCOUNTER — Ambulatory Visit (HOSPITAL_COMMUNITY)
Admission: RE | Admit: 2021-02-27 | Discharge: 2021-02-27 | Disposition: A | Discharge status: Home / Self Care | Payer: No Typology Code available for payment source | Attending: Internal Medicine | Admitting: Internal Medicine

## 2021-02-27 ENCOUNTER — Encounter (HOSPITAL_COMMUNITY)
Admission: RE | Disposition: A | Discharge status: Home / Self Care | Payer: Self-pay | Source: Home / Self Care | Attending: Internal Medicine

## 2021-02-27 ENCOUNTER — Other Ambulatory Visit: Payer: Self-pay

## 2021-02-27 DIAGNOSIS — I340 Nonrheumatic mitral (valve) insufficiency: Secondary | ICD-10-CM

## 2021-02-27 DIAGNOSIS — I5022 Chronic systolic (congestive) heart failure: Secondary | ICD-10-CM | POA: Diagnosis not present

## 2021-02-27 DIAGNOSIS — I361 Nonrheumatic tricuspid (valve) insufficiency: Secondary | ICD-10-CM | POA: Diagnosis not present

## 2021-02-27 DIAGNOSIS — I081 Rheumatic disorders of both mitral and tricuspid valves: Secondary | ICD-10-CM | POA: Diagnosis present

## 2021-02-27 DIAGNOSIS — Z955 Presence of coronary angioplasty implant and graft: Secondary | ICD-10-CM | POA: Diagnosis not present

## 2021-02-27 DIAGNOSIS — K219 Gastro-esophageal reflux disease without esophagitis: Secondary | ICD-10-CM | POA: Diagnosis not present

## 2021-02-27 DIAGNOSIS — Z79899 Other long term (current) drug therapy: Secondary | ICD-10-CM | POA: Insufficient documentation

## 2021-02-27 DIAGNOSIS — F17210 Nicotine dependence, cigarettes, uncomplicated: Secondary | ICD-10-CM | POA: Insufficient documentation

## 2021-02-27 DIAGNOSIS — I2510 Atherosclerotic heart disease of native coronary artery without angina pectoris: Secondary | ICD-10-CM | POA: Insufficient documentation

## 2021-02-27 DIAGNOSIS — I255 Ischemic cardiomyopathy: Secondary | ICD-10-CM | POA: Diagnosis not present

## 2021-02-27 DIAGNOSIS — I110 Hypertensive heart disease with heart failure: Secondary | ICD-10-CM | POA: Insufficient documentation

## 2021-02-27 DIAGNOSIS — E785 Hyperlipidemia, unspecified: Secondary | ICD-10-CM | POA: Insufficient documentation

## 2021-02-27 DIAGNOSIS — F1910 Other psychoactive substance abuse, uncomplicated: Secondary | ICD-10-CM | POA: Insufficient documentation

## 2021-02-27 DIAGNOSIS — I272 Pulmonary hypertension, unspecified: Secondary | ICD-10-CM | POA: Diagnosis not present

## 2021-02-27 HISTORY — PX: TEE WITHOUT CARDIOVERSION: SHX5443

## 2021-02-27 HISTORY — PX: RIGHT/LEFT HEART CATH AND CORONARY ANGIOGRAPHY: CATH118266

## 2021-02-27 LAB — POCT I-STAT, CHEM 8
BUN: 12 mg/dL (ref 6–20)
Calcium, Ion: 1.22 mmol/L (ref 1.15–1.40)
Chloride: 107 mmol/L (ref 98–111)
Creatinine, Ser: 1 mg/dL (ref 0.61–1.24)
Glucose, Bld: 94 mg/dL (ref 70–99)
HCT: 28 % — ABNORMAL LOW (ref 39.0–52.0)
Hemoglobin: 9.5 g/dL — ABNORMAL LOW (ref 13.0–17.0)
Potassium: 3.9 mmol/L (ref 3.5–5.1)
Sodium: 143 mmol/L (ref 135–145)
TCO2: 24 mmol/L (ref 22–32)

## 2021-02-27 LAB — POCT I-STAT EG7
Acid-Base Excess: 0 mmol/L (ref 0.0–2.0)
Acid-Base Excess: 0 mmol/L (ref 0.0–2.0)
Bicarbonate: 25.1 mmol/L (ref 20.0–28.0)
Bicarbonate: 25.2 mmol/L (ref 20.0–28.0)
Calcium, Ion: 1.22 mmol/L (ref 1.15–1.40)
Calcium, Ion: 1.24 mmol/L (ref 1.15–1.40)
HCT: 27 % — ABNORMAL LOW (ref 39.0–52.0)
HCT: 27 % — ABNORMAL LOW (ref 39.0–52.0)
Hemoglobin: 9.2 g/dL — ABNORMAL LOW (ref 13.0–17.0)
Hemoglobin: 9.2 g/dL — ABNORMAL LOW (ref 13.0–17.0)
O2 Saturation: 47 %
O2 Saturation: 48 %
Potassium: 3.9 mmol/L (ref 3.5–5.1)
Potassium: 3.9 mmol/L (ref 3.5–5.1)
Sodium: 144 mmol/L (ref 135–145)
Sodium: 144 mmol/L (ref 135–145)
TCO2: 26 mmol/L (ref 22–32)
TCO2: 26 mmol/L (ref 22–32)
pCO2, Ven: 40.2 mmHg — ABNORMAL LOW (ref 44.0–60.0)
pCO2, Ven: 40.8 mmHg — ABNORMAL LOW (ref 44.0–60.0)
pH, Ven: 7.398 (ref 7.250–7.430)
pH, Ven: 7.402 (ref 7.250–7.430)
pO2, Ven: 26 mmHg — CL (ref 32.0–45.0)
pO2, Ven: 26 mmHg — CL (ref 32.0–45.0)

## 2021-02-27 LAB — POCT I-STAT 7, (LYTES, BLD GAS, ICA,H+H)
Acid-Base Excess: 0 mmol/L (ref 0.0–2.0)
Acid-Base Excess: 0 mmol/L (ref 0.0–2.0)
Bicarbonate: 24 mmol/L (ref 20.0–28.0)
Bicarbonate: 24.5 mmol/L (ref 20.0–28.0)
Calcium, Ion: 1.2 mmol/L (ref 1.15–1.40)
Calcium, Ion: 1.22 mmol/L (ref 1.15–1.40)
HCT: 26 % — ABNORMAL LOW (ref 39.0–52.0)
HCT: 27 % — ABNORMAL LOW (ref 39.0–52.0)
Hemoglobin: 8.8 g/dL — ABNORMAL LOW (ref 13.0–17.0)
Hemoglobin: 9.2 g/dL — ABNORMAL LOW (ref 13.0–17.0)
O2 Saturation: 99 %
O2 Saturation: 99 %
Potassium: 3.8 mmol/L (ref 3.5–5.1)
Potassium: 3.8 mmol/L (ref 3.5–5.1)
Sodium: 143 mmol/L (ref 135–145)
Sodium: 143 mmol/L (ref 135–145)
TCO2: 25 mmol/L (ref 22–32)
TCO2: 26 mmol/L (ref 22–32)
pCO2 arterial: 37 mmHg (ref 32.0–48.0)
pCO2 arterial: 38 mmHg (ref 32.0–48.0)
pH, Arterial: 7.418 (ref 7.350–7.450)
pH, Arterial: 7.42 (ref 7.350–7.450)
pO2, Arterial: 123 mmHg — ABNORMAL HIGH (ref 83.0–108.0)
pO2, Arterial: 125 mmHg — ABNORMAL HIGH (ref 83.0–108.0)

## 2021-02-27 LAB — CBC
HCT: 29.5 % — ABNORMAL LOW (ref 39.0–52.0)
Hemoglobin: 8.1 g/dL — ABNORMAL LOW (ref 13.0–17.0)
MCH: 18.3 pg — ABNORMAL LOW (ref 26.0–34.0)
MCHC: 27.5 g/dL — ABNORMAL LOW (ref 30.0–36.0)
MCV: 66.6 fL — ABNORMAL LOW (ref 80.0–100.0)
Platelets: 227 10*3/uL (ref 150–400)
RBC: 4.43 MIL/uL (ref 4.22–5.81)
RDW: 21.1 % — ABNORMAL HIGH (ref 11.5–15.5)
WBC: 5.7 10*3/uL (ref 4.0–10.5)
nRBC: 0 % (ref 0.0–0.2)

## 2021-02-27 SURGERY — RIGHT/LEFT HEART CATH AND CORONARY ANGIOGRAPHY
Anesthesia: LOCAL

## 2021-02-27 SURGERY — ECHOCARDIOGRAM, TRANSESOPHAGEAL
Anesthesia: Monitor Anesthesia Care

## 2021-02-27 MED ORDER — HEPARIN (PORCINE) IN NACL 1000-0.9 UT/500ML-% IV SOLN
INTRAVENOUS | Status: DC | PRN
  Administered 2021-02-27 (×2): 500 mL

## 2021-02-27 MED ORDER — ASPIRIN 81 MG PO CHEW: 81.0000 mg | CHEWABLE_TABLET | Freq: Every day | ORAL | Status: DC

## 2021-02-27 MED ORDER — LIDOCAINE HCL (PF) 1 % IJ SOLN
INTRAMUSCULAR | Status: AC
  Filled 2021-02-27: qty 30

## 2021-02-27 MED ORDER — SODIUM CHLORIDE 0.9 % IV SOLN
INTRAVENOUS | Status: DC
  Administered 2021-02-27: 500 mL via INTRAVENOUS

## 2021-02-27 MED ORDER — LABETALOL HCL 5 MG/ML IV SOLN: 10.0000 mg | INTRAVENOUS | Status: DC | PRN

## 2021-02-27 MED ORDER — PROPOFOL 500 MG/50ML IV EMUL
INTRAVENOUS | Status: DC | PRN
  Administered 2021-02-27: 160 ug/kg/min via INTRAVENOUS

## 2021-02-27 MED ORDER — BUTAMBEN-TETRACAINE-BENZOCAINE 2-2-14 % EX AERO
INHALATION_SPRAY | CUTANEOUS | Status: DC | PRN
  Administered 2021-02-27: 2 via TOPICAL

## 2021-02-27 MED ORDER — SODIUM CHLORIDE 0.9 % IV SOLN: 250.0000 mL | INTRAVENOUS | Status: DC | PRN

## 2021-02-27 MED ORDER — HEPARIN (PORCINE) IN NACL 1000-0.9 UT/500ML-% IV SOLN
INTRAVENOUS | Status: AC
  Filled 2021-02-27: qty 1000

## 2021-02-27 MED ORDER — ASPIRIN 81 MG PO CHEW: 81.0000 mg | CHEWABLE_TABLET | Freq: Once | ORAL | Status: DC

## 2021-02-27 MED ORDER — SODIUM CHLORIDE 0.9 % IV SOLN: INTRAVENOUS | Status: DC

## 2021-02-27 MED ORDER — IOHEXOL 350 MG/ML SOLN
INTRAVENOUS | Status: DC | PRN
  Administered 2021-02-27: 55 mL

## 2021-02-27 MED ORDER — SODIUM CHLORIDE 0.9% FLUSH: 3.0000 mL | Freq: Two times a day (BID) | INTRAVENOUS | Status: DC

## 2021-02-27 MED ORDER — SODIUM CHLORIDE 0.9% FLUSH: 3.0000 mL | INTRAVENOUS | Status: DC | PRN

## 2021-02-27 MED ORDER — SODIUM CHLORIDE 0.9 % IV SOLN: INTRAVENOUS | Status: DC | PRN

## 2021-02-27 MED ORDER — VERAPAMIL HCL 2.5 MG/ML IV SOLN
INTRAVENOUS | Status: AC
  Filled 2021-02-27: qty 2

## 2021-02-27 MED ORDER — MIDAZOLAM HCL 2 MG/2ML IJ SOLN
INTRAMUSCULAR | Status: DC | PRN
  Administered 2021-02-27: 0.5 mg via INTRAVENOUS

## 2021-02-27 MED ORDER — CLOPIDOGREL BISULFATE 75 MG PO TABS: 75.0000 mg | ORAL_TABLET | Freq: Every day | ORAL | Status: DC

## 2021-02-27 MED ORDER — SODIUM CHLORIDE 0.9 % IV SOLN
INTRAVENOUS | Status: AC | PRN
  Administered 2021-02-27: 10 mL/h via INTRAVENOUS

## 2021-02-27 MED ORDER — PROPOFOL 10 MG/ML IV BOLUS
INTRAVENOUS | Status: DC | PRN
  Administered 2021-02-27 (×2): 20 mg via INTRAVENOUS
  Administered 2021-02-27: 60 mg via INTRAVENOUS
  Administered 2021-02-27 (×2): 20 mg via INTRAVENOUS

## 2021-02-27 MED ORDER — LIDOCAINE 2% (20 MG/ML) 5 ML SYRINGE
INTRAMUSCULAR | Status: DC | PRN
  Administered 2021-02-27: 30 mg via INTRAVENOUS

## 2021-02-27 MED ORDER — MIDAZOLAM HCL 2 MG/2ML IJ SOLN
INTRAMUSCULAR | Status: AC
  Filled 2021-02-27: qty 2

## 2021-02-27 MED ORDER — LIDOCAINE HCL (PF) 1 % IJ SOLN
INTRAMUSCULAR | Status: DC | PRN
  Administered 2021-02-27 (×2): 5 mL
  Administered 2021-02-27: 20 mL
  Administered 2021-02-27: 10 mL

## 2021-02-27 MED ORDER — HYDRALAZINE HCL 20 MG/ML IJ SOLN: 10.0000 mg | INTRAMUSCULAR | Status: DC | PRN

## 2021-02-27 MED ORDER — ONDANSETRON HCL 4 MG/2ML IJ SOLN: 4.0000 mg | Freq: Four times a day (QID) | INTRAMUSCULAR | Status: DC | PRN

## 2021-02-27 MED ORDER — ACETAMINOPHEN 325 MG PO TABS: 650.0000 mg | ORAL_TABLET | ORAL | Status: DC | PRN

## 2021-02-27 MED ORDER — ATORVASTATIN CALCIUM 40 MG PO TABS
40.0000 mg | ORAL_TABLET | Freq: Every day | ORAL | Status: DC
  Filled 2021-02-27: qty 1

## 2021-02-27 SURGICAL SUPPLY — 15 items
CATH INFINITI 5FR MULTPACK ANG (CATHETERS) ×2 IMPLANT
CATH SWAN GANZ 7F STRAIGHT (CATHETERS) ×2 IMPLANT
CLOSURE MYNX CONTROL 5F (Vascular Products) ×2 IMPLANT
CLOSURE MYNX CONTROL 6F/7F (Vascular Products) ×2 IMPLANT
GLIDESHEATH SLEND SS 6F .021 (SHEATH) ×2 IMPLANT
KIT HEART LEFT (KITS) ×2 IMPLANT
PACK CARDIAC CATHETERIZATION (CUSTOM PROCEDURE TRAY) ×2 IMPLANT
SHEATH GLIDE SLENDER 4/5FR (SHEATH) ×2 IMPLANT
SHEATH PINNACLE 5F 10CM (SHEATH) ×4 IMPLANT
SHEATH PINNACLE 7F 10CM (SHEATH) ×2 IMPLANT
TRANSDUCER W/STOPCOCK (MISCELLANEOUS) ×2 IMPLANT
TUBING CIL FLEX 10 FLL-RA (TUBING) ×2 IMPLANT
WIRE EMERALD 3MM-J .025X260CM (WIRE) ×2 IMPLANT
WIRE EMERALD 3MM-J .035X150CM (WIRE) ×2 IMPLANT
WIRE MICROINTRODUCER 60CM (WIRE) ×2 IMPLANT

## 2021-02-27 NOTE — CV Procedure (Signed)
    TRANSESOPHAGEAL ECHOCARDIOGRAM   NAME:  Howard Thompson    MRN: [id number] DOB:  1965/12/23    ADMIT DATE: 02/27/2021  INDICATIONS: Mitral Regurgitation  PROCEDURE:   Informed consent was obtained prior to the procedure. The risks, benefits and alternatives for the procedure were discussed and the patient comprehended these risks.  Risks include, but are not limited to, cough, sore throat, vomiting, nausea, somnolence, esophageal and stomach trauma or perforation, bleeding, low blood pressure, aspiration, pneumonia, infection, trauma to the teeth and death.    Procedural time out performed. The oropharynx was anesthetized with topical 1% benzocaine.    Anesthesia was administered by Dr. Linna Caprice.  The patient was administered a total of 100 mg propfol to achieve and maintain moderate conscious sedation.  The patient's heart rate, blood pressure, and oxygen saturation are monitored continuously during the procedure. The period of conscious sedation is 24  minutes, of which I was present face-to-face 100% of this time.   The transesophageal probe was inserted in the esophagus and stomach without difficulty and multiple views were obtained.   During the study patient began to have desaturation to 60% with concomitant tachycardia.  Clinical question of severe mitral regurgitation was answered, and patient required BIPAP support.  Patient study was ended and anesthesia pressure support initiated.  Patient recovered without issues.  Discussed with patient and interventional cardiology.  COMPLICATIONS:    There were no immediate post procedural complications.  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  Limited study. Full report to follow. Further management per primary team.   Rudean Haskell, MD [place]  6:25 PM

## 2021-02-27 NOTE — Discharge Instructions (Signed)
Femoral Site Care  This sheet gives you information about how to care for yourself after your procedure. Your health care provider may also give you more specific instructions. If you have problems or questions, contact your health care provider. What can I expect after the procedure? After the procedure, it is common to have:  Bruising that usually fades within 1-2 weeks.  Tenderness at the site. Follow these instructions at home: Wound care  Follow instructions from your health care provider about how to take care of your insertion site. Make sure you: ? Wash your hands with soap and water before you change your bandage (dressing). If soap and water are not available, use hand sanitizer. ? Change your dressing as told by your health care provider. ? Leave stitches (sutures), skin glue, or adhesive strips in place. These skin closures may need to stay in place for 2 weeks or longer. If adhesive strip edges start to loosen and curl up, you may trim the loose edges. Do not remove adhesive strips completely unless your health care provider tells you to do that.  Do not take baths, swim, or use a hot tub until your health care provider approves.  You may shower 24-48 hours after the procedure or as told by your health care provider. ? Gently wash the site with plain soap and water. ? Pat the area dry with a clean towel. ? Do not rub the site. This may cause bleeding.  Do not apply powder or lotion to the site. Keep the site clean and dry.  Check your femoral site every day for signs of infection. Check for: ? Redness, swelling, or pain. ? Fluid or blood. ? Warmth. ? Pus or a bad smell. Activity  For the first 2-3 days after your procedure, or as long as directed: ? Avoid climbing stairs as much as possible. ? Do not squat.  Do not lift anything that is heavier than 10 lb (4.5 kg), or the limit that you are told, until your health care provider says that it is safe.  Rest as  directed. ? Avoid sitting for a long time without moving. Get up to take short walks every 1-2 hours.  Do not drive for 24 hours if you were given a medicine to help you relax (sedative). General instructions  Take over-the-counter and prescription medicines only as told by your health care provider.  Keep all follow-up visits as told by your health care provider. This is important. Contact a health care provider if you have:  A fever or chills.  You have redness, swelling, or pain around your insertion site. Get help right away if:  The catheter insertion area swells very fast.  You pass out.  You suddenly start to sweat or your skin gets clammy.  The catheter insertion area is bleeding, and the bleeding does not stop when you hold steady pressure on the area.  The area near or just beyond the catheter insertion site becomes pale, cool, tingly, or numb. These symptoms may represent a serious problem that is an emergency. Do not wait to see if the symptoms will go away. Get medical help right away. Call your local emergency services (911 in the [place] Do not drive yourself to the hospital. Summary  After the procedure, it is common to have bruising that usually fades within 1-2 weeks.  Check your femoral site every day for signs of infection.  Do not lift anything that is heavier than 10 lb (4.5 kg), or   the limit that you are told, until your health care provider says that it is safe. This information is not intended to replace advice given to you by your health care provider. Make sure you discuss any questions you have with your health care provider. Document Revised: 06/15/2020 Document Reviewed: 06/15/2020 Elsevier Patient Education  2021 Elsevier Inc.  

## 2021-02-27 NOTE — CV Procedure (Signed)
Unable to reach Patients friend Guss B[address].

## 2021-02-27 NOTE — Anesthesia Postprocedure Evaluation (Signed)
Anesthesia Post Note  Patient: Howard Thompson  Procedure(s) Performed: TRANSESOPHAGEAL ECHOCARDIOGRAM (TEE) (N/A )     Patient location during evaluation: Endoscopy Anesthesia Type: MAC Level of consciousness: awake and alert Pain management: pain level controlled Vital Signs Assessment: post-procedure vital signs reviewed and stable Respiratory status: spontaneous breathing, nonlabored ventilation, respiratory function stable and patient connected to nasal cannula oxygen Cardiovascular status: stable and blood pressure returned to baseline Postop Assessment: no apparent nausea or vomiting Anesthetic complications: no   No complications documented.  Last Vitals:  Vitals:   02/27/21 1900 02/27/21 1930  BP: 105/61 102/73  Pulse: 90 86  Resp:    Temp:    SpO2: 100% 97%    Last Pain:  Vitals:   02/27/21 1726  TempSrc:   PainSc: 0-No pain                 Cordell Coke COKER

## 2021-02-27 NOTE — Anesthesia Procedure Notes (Signed)
Procedure Name: MAC Date/Time: 02/27/2021 2:35 PM Performed by: Leonor Liv, CRNA Pre-anesthesia Checklist: Patient identified, Emergency Drugs available, Suction available, Patient being monitored and Timeout performed Patient Re-evaluated:Patient Re-evaluated prior to induction Oxygen Delivery Method: Nasal cannula Airway Equipment and Method: Bite block Placement Confirmation: positive ETCO2 Dental Injury: Teeth and Oropharynx as per pre-operative assessment

## 2021-02-27 NOTE — Anesthesia Preprocedure Evaluation (Addendum)
Anesthesia Evaluation  Patient identified by MRN, date of birth, ID band Patient awake    Reviewed: Allergy & Precautions, NPO status , Patient's Chart, lab work & pertinent test results  Airway Mallampati: II  TM Distance: >3 FB Neck ROM: Full    Dental  (+) Teeth Intact, Dental Advisory Given   Pulmonary Current Smoker,    breath sounds clear to auscultation       Cardiovascular  Rhythm:Regular Rate:Normal     Neuro/Psych    GI/Hepatic   Endo/Other    Renal/GU      Musculoskeletal   Abdominal   Peds  Hematology   Anesthesia Other Findings   Reproductive/Obstetrics                             Anesthesia Physical Anesthesia Plan  ASA: III  Anesthesia Plan: MAC   Post-op Pain Management:    Induction: Intravenous  PONV Risk Score and Plan: Ondansetron and Dexamethasone  Airway Management Planned: Natural Airway and Nasal Cannula  Additional Equipment:   Intra-op Plan:   Post-operative Plan:   Informed Consent: I have reviewed the patients History and Physical, chart, labs and discussed the procedure including the risks, benefits and alternatives for the proposed anesthesia with the patient or authorized representative who has indicated his/her understanding and acceptance.     Dental advisory given  Plan Discussed with: CRNA and Anesthesiologist  Anesthesia Plan Comments:         Anesthesia Quick Evaluation

## 2021-02-27 NOTE — Progress Notes (Incomplete)
  Echocardiogram Echocardiogram Transesophageal has been performed.  Bobbye Charleston 02/27/2021, 3:23 PM

## 2021-02-27 NOTE — Interval H&P Note (Signed)
History and Physical Interval Note:  02/27/2021 1:59 PM  Howard Thompson  has presented today for surgery, with the diagnosis of MITRAL REGURGITATION.  The various methods of treatment have been discussed with the patient and family. After consideration of risks, benefits and other options for treatment, the patient has consented to  Procedure(s): TRANSESOPHAGEAL ECHOCARDIOGRAM (TEE) (N/A) as a surgical intervention.  The patient's history has been reviewed, patient examined, no change in status, stable for surgery.  I have reviewed the patient's chart and labs.  Questions were answered to the patient's satisfaction.     Howard Thompson

## 2021-02-27 NOTE — Transfer of Care (Signed)
Immediate Anesthesia Transfer of Care Note  Patient: Howard Thompson  Procedure(s) Performed: TRANSESOPHAGEAL ECHOCARDIOGRAM (TEE) (N/A )  Patient Location: Endoscopy Unit  Anesthesia Type:MAC  Level of Consciousness: awake, alert  and oriented  Airway & Oxygen Therapy: Patient Spontanous Breathing and Patient connected to nasal cannula oxygen  Post-op Assessment: Report given to RN, Post -op Vital signs reviewed and stable and Patient moving all extremities  Post vital signs: Reviewed and stable  Last Vitals:  Vitals Value Taken Time  BP 123/73 02/27/21 1525  Temp 36.3 C 02/27/21 1525  Pulse 96 02/27/21 1532  Resp 22 02/27/21 1532  SpO2 100 % 02/27/21 1532  Vitals shown include unvalidated device data.  Last Pain:  Vitals:   02/27/21 1525  TempSrc: Oral  PainSc: 0-No pain         Complications: No complications documented.

## 2021-02-28 ENCOUNTER — Encounter (HOSPITAL_COMMUNITY): Payer: Self-pay | Admitting: Cardiovascular Disease

## 2021-02-28 LAB — ECHO TEE
AV Mean grad: 2 mmHg
AV Peak grad: 4.4 mmHg
Ao pk vel: 1.05 m/s
MV M vel: 4.53 m/s
MV Peak grad: 82.1 mmHg
Radius: 1.1 cm

## 2021-02-28 MED FILL — Verapamil HCl IV Soln 2.5 MG/ML: INTRAVENOUS | Qty: 2 | Status: AC

## 2021-03-01 ENCOUNTER — Other Ambulatory Visit: Payer: Self-pay

## 2021-03-01 ENCOUNTER — Telehealth: Payer: Self-pay | Admitting: Cardiovascular Disease

## 2021-03-01 MED ORDER — FUROSEMIDE 40 MG PO TABS: 40.0000 mg | ORAL_TABLET | Freq: Every day | ORAL | 0 refills | Status: DC

## 2021-03-01 NOTE — Telephone Encounter (Signed)
**Note De-Identified  Obfuscation** Urgent Furosemide PA started through covermymeds. Key: [id number]  No PA required per covermymeds.  I escribed the pts Furosemide RX to [facility] in [place] to fill. I then called them to see if a Furosemide PA is needed and was advised that the pt is requesting a refill too soon as it was last filled on 01/17/2021 for a 90 day supply and cannot be filled again until July 4th, 2022.  I called the pt to discuss and he is anxious as he states that he is down to his last Furosemide tablet and that the office has been aware for several days. . I apologized and advised that I was not aware of this until shortly before 2 pm today.    He states that he switched his RXs from [facility] to [facility] and now [facility] will not give him his refill.  He states he only received a 30 day supply on 3/24 so it cannot be too soon to request a refill.  The pt has a hard time recalling all of the pharmacies he has been getting his refills at but states that it is only [facility] and [facility] but at several different locations for both pharmacies.  I have advised him to contact his ins provider to discuss as there is not anything I can do at this point.  He is aware that I have already escribed his Furosemide RX to [facility] in [place] to fill and he states he will pay for it out of pocket until he can settle up with his ins provider.  He thanked me for returning his call to update.

## 2021-03-01 NOTE — Telephone Encounter (Signed)
Pt c/o medication issue:  1. Name of Medication: furosemide (LASIX) 40 MG tablet  2. How are you currently taking this medication (dosage and times per day)? As prescribed   3. Are you having a reaction (difficulty breathing--STAT)? No   4. What is your medication issue? Gerber is calling stating his pharmacy has been trying to contact the office to receive PA for this medication. He states they had to give him a 5 day emergency supply of the medication to hold him until the PA is received. Once completed he is requesting a 90 day supply be sent to the pharmacy listed under his preferred pharmacies on his snapshot. He is also requesting a callback with the status of the prescription once this is completed. Please advise.

## 2021-03-01 NOTE — Telephone Encounter (Signed)
Please complete PA asap.   Thank you!

## 2021-03-05 ENCOUNTER — Telehealth: Payer: Self-pay | Admitting: Student

## 2021-03-05 NOTE — Telephone Encounter (Signed)
   I spoke with Dr. Gasper Sells, who performed recent TEE, who was concerned about severe functional MR and recommended optimization of CHF medications and diuresis. Patient's O2 sats dropped to the 50s during the TEE and he required BiPAP so there is concern that he would not tolerate a MitraClip procedure unless he is better optimized.  When I saw him in clinic on 02/13/2021, he did not look very volume overloaded. I started Entresto at that time. He was already on Coreg and Spironolactone. Dr. Gasper Sells recommended either increasing Entresto, increasing Lasix, or starting SGLT2 inhibitor. Before adjusting anything, I would like to make sure his BP is stable after starting and Entresto and confirm when he actually started as we had difficult getting a hold of him with lab results and recommendations initially.  Sharyn Lull, do you mind calling the patient to see if he has any home BP readings and confirm he is taking Entresto (and when he actually started it)? He has an appointment with Denyse Amass scheduled for 03/26/2021. If there is anyway we could get him in any sooner that would be great (I know schedules are slammed but just asking in case there as been a cancellation or anything)? Thanks so much!  Howard Mclean, PA-C 03/05/2021 7:54 AM

## 2021-03-05 NOTE — Telephone Encounter (Signed)
called X2, unable to [facility] as"mailbox is full". I have found a sooner appt for message per Sande Rives, PA-C. Appt 5-13 w/Kathryn Purcell Nails. Will call again later

## 2021-03-07 NOTE — Progress Notes (Signed)
Cardiology Office Note   Date:  03/08/2021   ID:  Howard Thompson, DOB 02/03/1966, MRN [id number]  PCP:  Jamesetta Geralds, MD  Cardiologist: [facility]  No chief complaint on file.    History of Present Illness: Howard Thompson is a 55 y.o. male who presents for ongoing assessment and management of coronary artery disease, status post V. fib arrest and STEMI on 04/2018, leading to cardiac catheterization requiring DES to the LAD.  He had a another STEMI and 12/16/2020 after noncompliance with Plavix leading to additional cardiac catheterization with repeat DES to the LAD.  Other history includes ischemic cardiomyopathy, chronic systolic heart failure with EF of 25% to 30% on echo in October 2021, PAD status post left femoral endarterectomy with vein patch angioplasty and then ultimately a femoral artery bypass at [facility] in 2016 in 2017.    Additionally the patient has a history of hypertension, hyperlipidemia, COPD, GI bleed, myasthenia gravis gravis status post thymectomy, and polysubstance abuse with tobacco, cocaine, marijuana, benzodiazepines, and amphetamines, and therapeutic medical noncompliance  The patient also has a history of severe mitral valve regurgitation and was evaluated during hospitalization in October 2021 by Dr. Nils Pyle.Per Dr. Lucianne Lei Trigt's note: "He should be considered for high risk mitral valve replacement repair but would need repeat right and left heart cath, TEE, as well as dental evaluation and probable extractions. Patient can be referred back to our office as an outpatient to further discuss high risk mitral surgery and possible CABG pending results of left heart cath." Patient again never followed up with Cardiology following discharge.  On last office visit he was seen by Sande Rives, PA, and had not been taking Plavix as he had run out.  He was restarted on Plavix 75 mg daily, aspirin was discontinued, and he was continued on beta-blocker.  He was found to be  euvolemic at the time of his office visit and he was continued on Lasix but Coreg was increased to 6.25 mg twice daily spironolactone was continued at 25 mg daily, Entresto was started at 24/26 mg twice daily.  Due to severe MR, he was to be seen by cardiovascular surgeon.  It had been recommended that he have a dental evaluation and provide probable extractions prior to having surgery.  A TEE and a right and left heart cath were ordered.  He is also to have a repeat lower extremity ultrasound in the setting of PAD but due to his multiple medical issues this was not ordered due to time limits and need for cardiac evaluation as priority.  He was also prescribed Protonix 40 mg daily for GERD.  Cardiac catheterization on 02/27/2021 revealed previously placed distal left main to ostial LAD stent was widely patent, previously placed ostial LAD to proximal LAD stent was widely patent.  There was no significant coronary artery disease with widely patent tandem stents in the ostium and proximal LAD.  There was moderately elevated right heart pressures with moderate pulmonary hypertension with a PA pressure of 37 mm.  There was significant V wave of 42 mm and the pulmonary wedge pressure tracing consistent with patient's severe mitral regurgitation.  LVEDP was 25 mmHg.  It was to be discussed whether he was a candidate for mitral clip procedure versus surgical intervention among cardiology, cardiovascular surgeon colleagues.  A TEE was scheduled.  The TEE confirmed severe mitral regurgitation with possible PFO.  EF was 25% to 30% which was unchanged from prior studies.  The patient was referred to  Dr. Burt Knack for consideration of MitraClip.  He comes today having not taken Entresto as it was not filled by the pharmacy due to insurance confusions, and has run out of Lasix as well for the same reason.  He recently changed his insurance companies and pharmacies and there has been some discrepancies in what medications have  been filled which have not.  He has not yet received an appointment with Dr. Burt Knack concerning mitral valve clip discussion.  He has gained 8 pounds since discharge from the hospital.  He complains of dyspnea on exertion and insomnia.  Past Medical History:  Diagnosis Date  . Acute renal failure ([place])    a. a. during 7-05/2018 adm for cardiac arrest, requiring HD temporarily.  . Alcohol abuse   . Anemia   . AVM (arteriovenous malformation) of colon   . Cardiac arrest ([place]) 05/22/2018   Vfib  . CHF (congestive heart failure) ([place])   . Cocaine abuse ([place])   . COPD (chronic obstructive pulmonary disease) ([place])   . Coronary artery disease    a. hx of POBA in [place]. b. out-of-hospital cardiac arrest 04/2018 (vfib/STEMI) s/p DES to prox LAD (complex admission with GIB, renal failure requiring HD, encephalopathy). c. STEMI 11/2018 after pt stopped Plavix - s/p DES to LAD.  Marland Kitchen Duodenitis   . GI bleed   . History of blood transfusion   . Hyperlipidemia   . Hypokalemia   . Hypomagnesemia   . Iron deficiency anemia   . Ischemic cardiomyopathy    a. EF <25% at time of cath/arrest 04/2018, improved to 40-45% by f/u echo 06/09/18.  . Myasthenia gravis ([place])   . Myocardial infarction ([place]) 05/22/2018   STEMI  . PAD (peripheral artery disease) ([place])    a. femoral bypass  . Shock liver    a. during 7-05/2018 adm for cardiac arrest and again in 11/2018.  Marland Kitchen Thrombocytopenia ([place]) 04/2018  . Tobacco abuse   . Ventricular fibrillation [facility])     Past Surgical History:  Procedure Laterality Date  . ATHERECTOMY    . BIOPSY  07/16/2018   Procedure: BIOPSY;  Surgeon: Gatha Mayer, MD;  [facility];  Service: Endoscopy;;  . CARDIAC CATHETERIZATION  05/22/2018   stent  . COLONOSCOPY WITH PROPOFOL N/A 07/16/2018   Procedure: COLONOSCOPY WITH PROPOFOL;  Surgeon: Gatha Mayer, MD;  Location: [facility] ENDOSCOPY;  Service: Endoscopy;  Laterality: N/A;  . CORONARY ANGIOPLASTY    . CORONARY STENT  INTERVENTION N/A 05/22/2018   Procedure: CORONARY STENT INTERVENTION;  Surgeon: Lorretta Harp, MD;  Location: [place] CV LAB;  Service: Cardiovascular;  Laterality: N/A;  . CORONARY/GRAFT ACUTE MI REVASCULARIZATION N/A 12/16/2018   Procedure: CORONARY/GRAFT ACUTE MI REVASCULARIZATION;  Surgeon: Troy Sine, MD;  Location: [place] CV LAB;  Service: Cardiovascular;  Laterality: N/A;  . ENTEROSCOPY N/A 01/22/2019   Procedure: ENTEROSCOPY;  Surgeon: Thornton Park, MD;  Location: [place];  Service: Gastroenterology;  Laterality: N/A;  . ESOPHAGOGASTRODUODENOSCOPY (EGD) WITH PROPOFOL N/A 06/10/2018   Procedure: ESOPHAGOGASTRODUODENOSCOPY (EGD) WITH PROPOFOL;  Surgeon: Ladene Artist, MD;  Location: [facility] ENDOSCOPY;  Service: Endoscopy;  Laterality: N/A;  . ESOPHAGOGASTRODUODENOSCOPY (EGD) WITH PROPOFOL N/A 07/16/2018   Procedure: ESOPHAGOGASTRODUODENOSCOPY (EGD) WITH PROPOFOL;  Surgeon: Gatha Mayer, MD;  Location: [place];  Service: Endoscopy;  Laterality: N/A;  . FEMORAL ARTERY - POPLITEAL ARTERY BYPASS GRAFT    . GIVENS CAPSULE STUDY N/A 01/20/2019   Procedure: GIVENS CAPSULE STUDY;  Surgeon: Gatha Mayer,  MD;  Location: [facility] ENDOSCOPY;  Service: Endoscopy;  Laterality: N/A;  . HOT HEMOSTASIS N/A 07/16/2018   Procedure: HOT HEMOSTASIS (ARGON PLASMA COAGULATION/BICAP);  Surgeon: Iva Boop, MD;  Location: [facility] ENDOSCOPY;  Service: Endoscopy;  Laterality: N/A;  . HOT HEMOSTASIS N/A 01/22/2019   Procedure: HOT HEMOSTASIS (ARGON PLASMA COAGULATION/BICAP);  Surgeon: Tressia Danas, MD;  Location: [facility] ENDOSCOPY;  Service: Gastroenterology;  Laterality: N/A;  . IABP INSERTION N/A 12/16/2018   Procedure: IABP Insertion;  Surgeon: Lennette Bihari, MD;  [facility] INVASIVE CV LAB;  Service: Cardiovascular;  Laterality: N/A;  . IR FLUORO GUIDE CV LINE RIGHT  05/31/2018  . IR REMOVAL TUN CV CATH W/O FL  06/21/2018  . IR US GUIDE VASC ACCESS RIGHT  05/31/2018  . LEFT HEART CATH AND  CORONARY ANGIOGRAPHY N/A 05/22/2018   Procedure: LEFT HEART CATH AND CORONARY ANGIOGRAPHY;  Surgeon: Runell Gess, MD;  [facility] INVASIVE CV LAB;  Service: Cardiovascular;  Laterality: N/A;  . LEFT HEART CATH AND CORONARY ANGIOGRAPHY N/A 12/16/2018   Procedure: LEFT HEART CATH AND CORONARY ANGIOGRAPHY;  Surgeon: Lennette Bihari, MD;  [facility] CV LAB;  Service: Cardiovascular;  Laterality: N/A;  . RIGHT/LEFT HEART CATH AND CORONARY ANGIOGRAPHY N/A 02/27/2021   Procedure: RIGHT/LEFT HEART CATH AND CORONARY ANGIOGRAPHY;  Surgeon: Lennette Bihari, MD;  [facility] CV LAB;  Service: Cardiovascular;  Laterality: N/A;  . TEE WITHOUT CARDIOVERSION N/A 02/27/2021   Procedure: TRANSESOPHAGEAL ECHOCARDIOGRAM (TEE);  Surgeon: Christell Constant, MD;  Location: [facility] ENDOSCOPY;  Service: Cardiovascular;  Laterality: N/A;     Current Outpatient Medications  Medication Sig Dispense Refill  . atorvastatin (LIPITOR) 40 MG tablet Take 1 tablet (40 mg total) by mouth daily. 90 tablet 1  . carvedilol (COREG) 6.25 MG tablet Take 1 tablet (6.25 mg total) by mouth 2 (two) times daily with a meal. 90 tablet 3  . clopidogrel (PLAVIX) 75 MG tablet Take 1 tablet (75 mg total) by mouth daily. (Patient taking differently: Take 75 mg by mouth in the morning.) 90 tablet 3  . nitroGLYCERIN (NITROSTAT) 0.4 MG SL tablet Place 1 tablet (0.4 mg total) under the tongue every 5 (five) minutes as needed for chest pain (max 3 doses). (Patient taking differently: Place 0.4 mg under the tongue every 5 (five) minutes x 3 doses as needed for chest pain (max 3 doses).) 10 tablet 0  . pantoprazole (PROTONIX) 40 MG tablet Take 1 tablet (40 mg total) by mouth daily. 90 tablet 3  . spironolactone (ALDACTONE) 25 MG tablet Take 1 tablet (25 mg total) by mouth daily. 90 tablet 1  . torsemide (DEMADEX) 20 MG tablet Take 1 tablet (20 mg total) by mouth daily. 90 tablet 3  . sacubitril-valsartan (ENTRESTO) 24-26 MG Take 1  tablet by mouth 2 (two) times daily. 60 tablet 2   No current facility-administered medications for this visit.    Allergies:   Patient has no known allergies.    Social History:  The patient  reports that he has been smoking cigarettes. He has been smoking about 0.50 packs per day. He uses smokeless tobacco. He reports current alcohol use. He reports current drug use. Drugs: Cocaine, Marijuana, Methamphetamines, and Benzodiazepines.   Family History:  The patient's family history includes Alzheimer's disease in his father.    ROS: All other systems are reviewed and negative. Unless otherwise mentioned in H&P    PHYSICAL EXAM: VS:  BP 126/68   Pulse 91  Ht 5\' 9"  (1.753 m)   Wt 167 lb 9.6 oz (76 kg)   SpO2 99%   BMI 24.75 kg/m  , BMI Body mass index is 24.75 kg/m. GEN: Well nourished, well developed, in no acute distress HEENT: normal Neck: no JVD, carotid bruits, or masses Cardiac: RRR 3/6 systolic murmur with diastolic snap, radiation to the abdomin,; no rubs, or gallops,no edema  Respiratory:  Clear to auscultation bilaterally, normal work of breathing, mild bibasilar crackles cleared with cough GI: soft, nontender, nondistended, + BS MS: no deformity or atrophy Skin: warm and dry, no rash Neuro:  Strength and sensation are intact Psych: euthymic mood, full affect   EKG: Not completed this office visit severe mitral valve  Recent Labs: 08/23/2020: B Natriuretic Peptide 1,451.1 02/13/2021: ALT 14; Magnesium 2.1 02/27/2021: BUN 12; Creatinine, Ser 1.00; Hemoglobin 9.2; Hemoglobin 8.8; Platelets 227; Potassium 3.8; Potassium 3.8; Sodium 143; Sodium 143    Lipid Panel    Component Value Date/Time   CHOL 124 02/13/2021 1637   TRIG 42 02/13/2021 1637   HDL 36 (L) 02/13/2021 1637   CHOLHDL 3.4 02/13/2021 1637   CHOLHDL 3.6 12/17/2018 0237   VLDL 14 12/17/2018 0237   LDLCALC 78 02/13/2021 1637      Wt Readings from Last 3 Encounters:  03/08/21 167 lb 9.6 oz (76 kg)   02/27/21 160 lb (72.6 kg)  02/13/21 159 lb 6.4 oz (72.3 kg)      Other studies Reviewed:  LHC/RHC   Previously placed Dist LM to Ost LAD stent (unknown type) is widely patent.  Previously placed Ost LAD to Prox LAD stent (unknown type) is widely patent.   No significant coronary obstructive disease  with widely patent tandem stents in the ostium and proximal LAD.  Moderately elevated right heart pressures with moderate pulmonary hypertension with a PA pressure 37 mm.  There is significant V wave of 42 mm in the pulmonary wedge pressure tracing consistent with the patient's severe mitral regurgitation.  LVEDP 25 mm Hg.  RECOMMENDATION: We will plan to review data with colleagues and await the formal TEE analysis.  With severe MR and's severely reduced LV function will discuss potential candidacy for mitral clip procedure versus surgical intervention.    TEE 02/27/2021  1. Carpentier III Severe central mitral regurgitation in the setting of  leaflet restriction. ERO 0.65.Marland Kitchen The mitral valve is abnormal. Severe  mitral valve regurgitation. No evidence of mitral stenosis. The mean  mitral valve gradient is 2.0 mmHg.  2. Left ventricular ejection fraction, by estimation, is 25 to 30%. The  left ventricle has severely decreased function. The left ventricle  demonstrates global hypokinesis.  3. Right ventricular systolic function is mildly reduced. The right  ventricular size is moderately enlarged.  4. Left atrial size was severely dilated. Unable to visualize LAA- study  was stopped early in the setting of respiratory depression.  5. Right atrial size was mild to moderately dilated.  6. Tricuspid valve regurgitation is severe.  7. The aortic valve was not well visualized. Aortic valve regurgitation  is not visualized. Aortic valve mean gradient measures 2.0 mmHg. Aortic  valve Vmax measures 1.05 m/s.  8. PFO Suspected.   Conclusion(s)/Recommendation(s): Limited study  in the setting of  respiratory compromise. If plans for edge-to-edge repair; would repeat  study when medically optimized.   Left Cardiac Catheterization 12/16/2018:  Ost LAD to Prox LAD lesion is 100% stenosed.  Post intervention, there is a 0% residual stenosis.  A stent was  successfully placed.  Acute/Subacute thrombotic occlusion of the previously placed 3.0 x 20 mm Synergy stent in the proximal LAD with clot extending almost to the LAD ostium with chest pain duration of at least 30 hours. There was extensive recalcitrant thrombotic burden which persisted despite Angiomax, Aggrastat, multiple runs of Pronto thrombectomy, intracoronary nitroglycerin, intracoronary adenosine, distal infusion of adenosine, with ultimate complete resolution after insertion of an intra-aortic balloon pump for element of early cardiogenic shock and successful DES stenting of the LAD extending from the ostium to the mid vessel with insertion of a 3.5 x 38 mm Resolute stent postdilated to 3.75 mm with the 100% occlusion being reduced to 0% and after 3-1/2-hour procedure, ultimate reestablishment of brisk TIMI-3 flow antegrade down the LAD system which extends around the LV apex.  Normal left circumflex coronary artery.  Normal dominant RCA with initial faint collateralization of the LAD system via the distal RCA.  LVEDP at the start of the procedure was 30 mmHg.  Aggressive diuresis following Lasix 40 mg with ultimate 2.8 L of urine output during the procedure.  Recommendation: Continue Aggrastat for minimum of 18 hours post procedure. Bivalirudin will be discontinued 2 hours post procedure. We will plan to keep the intra-aortic balloon pump one-to-one today. Obtain follow-up 2D echo Doppler study. Recommend long-term dual antiplatelet therapy as long as there is no recurrent GI bleeds. Will assess drug screen with remote history of cocaine use. Smoking cessation is essential. Aggressive  lipid-lowering therapy with target LDL less than 70.  Diagnostic Dominance: Right    Intervention     _______________  Echocardiogram 08/24/2020: Impressions: 1. EF 25-30% with RWMA consistent with prior LAD infarction. The LV is  moderately dilated compared with prior. MR is now severe. Mild to moderate  AI with mild AS is present. AoV not well visualized.  2. The mid to apical anterior segments are akinetic. The apex is  akinetic. The mid to distal septum is akinetic. Findings are consistent  with prior LAD infarction. Left ventricular ejection fraction, by  estimation, is 25 to 30%. The left ventricle has  severely decreased function. The left ventricle demonstrates regional wall  motion abnormalities (see scoring diagram/findings for description). The  left ventricular internal cavity size was moderately dilated. Left  ventricular diastolic function could  not be evaluated.  3. Restricted PMVL in systole due to DCM (IIIB). 2D ERO 0.20 cm2 with  Rvol 27 cc. This is grossly underestimated due to oval shape of ERO. The  mitral valve is grossly normal. Severe mitral valve regurgitation.  4. Right ventricular systolic function is normal. The right ventricular  size is mildly enlarged. There is mildly elevated pulmonary artery  systolic pressure. The estimated right ventricular systolic pressure is  16.0 mmHg.  5. Left atrial size was severely dilated.  6. Right atrial size was mildly dilated.  7. The aortic valve was not well visualized. Aortic valve regurgitation  is mild to moderate. Mild aortic valve stenosis. Aortic valve mean  gradient measures 13.0 mmHg. Aortic valve Vmax measures 2.35 m/s.  8. The inferior vena cava is normal in size with greater than 50%  respiratory variability, suggesting right atrial pressure of 3 mmHg.   Comparison(s): Changes from prior study are noted.   Conclusion(s)/Recommendation(s): Findings consistent with ischemic   cardiomyopathy.    ASSESSMENT AND PLAN:  1.  Severe mitral valve regurgitation: He has been seen by Dr. Burt Knack in the hospital and underwent cardiac catheterization and now may be a candidate for  mitral valve clip.  The patient has yet to have appointment made concerning follow-up and we will try and get this done while he is here today.  Further testing and plans with explanation will be deferred to Dr. Burt Knack on that follow-up visit.  2.  CAD Cardiac catheterization on 02/27/2021 revealed previously placed distal left main to ostial LAD there was no significant coronary artery disease.  He did have an elevated PAP of 37 mm   with an LVEDP of 25 mmHg.  3.  Chronic systolic heart failure: Most recent echocardiogram revealing an EF of 25%.  He has not yet filled his Entresto prescription, and he has run out of Lasix.  After several phone calls by Meredith Pel to by [employer] as well as the pharmacies, the patient will need prior authorization on Entresto.  He will be changed from Lasix 40 mg to torsemide 20 mg daily.  Will need guideline mediated optimal therapy.  For now we will just get him started back  his diuretic, starting on Entresto as [employer] is waiting on preauthorization, he is given a coupon for 30 days free of 24/26 mg tablets twice daily.  In a couple of weeks on Entresto, and changed to torsemide.  These can be done on follow-up appointment with Dr. Burt Knack hopefully within the next couple of weeks.  4.  Possible PFO: This was described during TEE.  Defer to Dr. Burt Knack for further recommendations and intervention at his discretion.   5 Ongoing tobacco abuse:.  Cessation is strongly recommended with CAD and other comorbidities described above.  Current medicines are reviewed at length with the patient today.  I have spent 55 minutes dedicated to the care of this patient on the date of this encounter to include pre-visit review of records, assessment, management and  diagnostic testing,with shared decision making.  Labs/ tests ordered today include: None  Will need follow-up labs Phill Myron. West Pugh, ANP, AACC   03/08/2021 4:02 PM    [facility] [place] [address] Office [phone] Fax [phone]  Notice: This dictation was prepared with Dragon dictation along with smaller phrase technology. Any transcriptional errors that result from this process are unintentional and may not be corrected upon review.

## 2021-03-08 ENCOUNTER — Encounter: Payer: Self-pay | Admitting: Adult Health

## 2021-03-08 ENCOUNTER — Ambulatory Visit: Payer: No Typology Code available for payment source | Admitting: Adult Health

## 2021-03-08 ENCOUNTER — Other Ambulatory Visit: Payer: Self-pay

## 2021-03-08 VITALS — BP 126/68 | HR 91 | Ht 69.0 in | Wt 167.6 lb

## 2021-03-08 DIAGNOSIS — I340 Nonrheumatic mitral (valve) insufficiency: Secondary | ICD-10-CM

## 2021-03-08 DIAGNOSIS — F1910 Other psychoactive substance abuse, uncomplicated: Secondary | ICD-10-CM | POA: Diagnosis not present

## 2021-03-08 DIAGNOSIS — Q2112 Patent foramen ovale: Secondary | ICD-10-CM

## 2021-03-08 DIAGNOSIS — I2510 Atherosclerotic heart disease of native coronary artery without angina pectoris: Secondary | ICD-10-CM

## 2021-03-08 DIAGNOSIS — I5022 Chronic systolic (congestive) heart failure: Secondary | ICD-10-CM

## 2021-03-08 DIAGNOSIS — Z720 Tobacco use: Secondary | ICD-10-CM

## 2021-03-08 DIAGNOSIS — Q211 Atrial septal defect: Secondary | ICD-10-CM

## 2021-03-08 MED ORDER — TORSEMIDE 20 MG PO TABS: 20.0000 mg | ORAL_TABLET | Freq: Every day | ORAL | 3 refills | Status: DC

## 2021-03-08 MED ORDER — ENTRESTO 24-26 MG PO TABS: 1.0000 | ORAL_TABLET | Freq: Two times a day (BID) | ORAL | 2 refills | Status: DC

## 2021-03-08 NOTE — Telephone Encounter (Signed)
Notified at [facility] appt today

## 2021-03-08 NOTE — Patient Instructions (Signed)
Medication Instructions:  STOP- Furosemide START- Torsemide 20 mg by mouth daily  *If you need a refill on your cardiac medications before your next appointment, please call your pharmacy*   Lab Work: None Ordered   Testing/Procedures: None Ordered   Follow-Up: At [facility], you and your health needs are our priority.  As part of our continuing mission to provide you with exceptional heart care, we have created designated Provider Care Teams.  These Care Teams include your primary Cardiologist (physician) and Advanced Practice Providers (APPs -  Physician Assistants and Nurse Practitioners) who all work together to provide you with the care you need, when you need it.  We recommend signing up for the patient portal called "MyChart".  Sign up information is provided on this After Visit Summary.  MyChart is used to connect with patients for Virtual Visits (Telemedicine).  Patients are able to view lab/test results, encounter notes, upcoming appointments, etc.  Non-urgent messages can be sent to your provider as well.   To learn more about what you can do with MyChart, go to [email or web address].    Your next appointment:   Appointment with Dr Burt Knack

## 2021-03-11 ENCOUNTER — Telehealth: Payer: Self-pay | Admitting: General Practice

## 2021-03-11 NOTE — Telephone Encounter (Signed)
Mychart message sent to patient.

## 2021-03-11 NOTE — Telephone Encounter (Signed)
Called to update patient on follow up appointment with Dr. Tim Lair answer and voice mail is not set up---will continue to tray and reach patient vai phone

## 2021-03-12 ENCOUNTER — Telehealth: Payer: Self-pay

## 2021-03-12 NOTE — Telephone Encounter (Signed)
**Note De-Identified  Obfuscation** I started a Entresto PA through covermymeds. Key: [id number]

## 2021-03-12 NOTE — Telephone Encounter (Signed)
**Note De-Identified  Obfuscation** Message from covermymeds: Howard Thompson Key: [id number] - PA Case ID: [id number] - Rx #: [id number] Outcome: Approved today ENTRESTO TAB 24-26MG  is approved through 03/12/2022. Your patient may now fill this prescription and it will be covered. Drug: [name] 24-26MG  tablets Form: [employer] Electronic Prior Authorization Form (2017 NCPDP)  I have notified [place] of this approval.

## 2021-03-14 NOTE — Progress Notes (Signed)
Howard Thompson DOB 09-Dec-1965  There was several views that were not provided. If possible, I'd like to see a Bicaval/SAXB for transseptal puncture feasibility, an LVOT, and a IC view to see the location of the defect.   That said, from the information provided, the patient probably has a clippable valve. It is clear the patient has both MR and TR. MVA measures 6.8cm2 and the gradient measures 89mmHg. In the 0 degree view, the posterior leaflet is long enough to clip. Based on the functional nature of the MR, I'd recommend starting with an NTW and assessing for gradient.

## 2021-03-19 ENCOUNTER — Ambulatory Visit: Payer: No Typology Code available for payment source | Admitting: Cardiovascular Disease

## 2021-03-19 ENCOUNTER — Encounter: Payer: Self-pay | Admitting: Cardiovascular Disease

## 2021-03-19 ENCOUNTER — Other Ambulatory Visit: Payer: Self-pay

## 2021-03-19 VITALS — BP 110/40 | HR 80 | Ht 69.0 in | Wt 157.2 lb

## 2021-03-19 DIAGNOSIS — I340 Nonrheumatic mitral (valve) insufficiency: Secondary | ICD-10-CM | POA: Diagnosis not present

## 2021-03-19 NOTE — Patient Instructions (Signed)
Medication Instructions:  Your provider recommends that you continue on your current medications as directed. Please refer to the Current Medication list given to you today.   *If you need a refill on your cardiac medications before your next appointment, please call your pharmacy*  Follow-Up: Dr. Raynelle Dick office will call you to schedule your dental evaluation.  The [facility] will call to schedule your appointment.  You will be called for an update after the Valve Team discusses your case next week.

## 2021-03-19 NOTE — Progress Notes (Signed)
[facility]   MULTIDISCIPLINARY HEART VALVE TEAM  Date:  03/19/2021   ID:  [name], DOB 22-Nov-1965, MRN [id number]  PCP:  Jamesetta Geralds, MD   Chief Complaint  Patient presents with  . Shortness of Breath     HISTORY OF PRESENT ILLNESS: Howard Thompson is a 55 y.o. male who presents for evaluation of severe mitral regurgitation, referred by Dr Claiborne Billings and Jory Sims, NP.   The patient's cardiac history dates back to 2019 when he presented with anterior STEMI and out of hospital ventricular fibrillation cardiac arrest.  Post resuscitation EKG met criteria for anterior STEMI.  Emergency cardiac catheterization demonstrated total occlusion of the proximal LAD and he was treated with primary PCI using a drug-eluting stent.  Initially the patient had severe LV dysfunction with LVEF 25 to 30% with severe diffuse hypokinesis and akinesis of the anteroseptal and anteroapical walls.  During his index hospitalization multiple complications occurred including gastrointestinal bleeding, acute kidney injury leading to renal failure requiring temporary hemodialysis, and encephalopathy.  The patient has had multiple comorbid medical problems, including peripheral arterial disease with history of left femoral endarterectomy with vein patch angioplasty followed later by femoral artery bypass.  He has COPD, history of GI bleeding, hypertension, hyperlipidemia, myasthenia gravis status post thymectomy, and long history of polysubstance abuse including tobacco, marijuana, cocaine, benzodiazepines, and amphetamines.  The patient presented again in February 2020 with recurrent anterior STEMI and was found to have acute/subacute thrombotic occlusion of the LAD treated with aspiration thrombectomy and repeat stenting.  He reestablished with cardiology in April 2022 and it was recommended that he undergo right and left heart catheterization as well as transesophageal echo to evaluate further  management options of severe LV dysfunction, coronary artery disease, and severe mitral regurgitation.  Cardiac catheterization 02/27/2021 demonstrated widely patent stents in the LAD with no significant coronary obstructive disease elsewhere.  Right heart pressures were elevated, with mean PAP 37 mmHg, and V wave 42 mmHg. TEE showed severe MR with ERO 0.65 felt to be ischemic MR. The study was limited because of O2 desaturation and respiratory compromise.  The patient presents today for further evaluation of management options regarding his severe mitral regurgitation.  He was diagnosed with Myasthenia Gravis in his 13's and feels like this has contributed to chronic fatigue. He has had prior sternotomy for thymectomy. He feels like his energy level is progressively declining. 6 months ago he had much more energy. He denies any chest pain or pressure. He hasn't had to use any NTG. He denies orthopnea or PND. He has sleep apnea and is unable to tolerate CPAP. He admits to exertional dyspnea and is limited to about one block of walking. He states that as long as he is taking his diuretics he does fairly well. When he's had trouble in the past with decompensated heart failure, he generally has been off of his medications. 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 The patient smokes cigarettes, and occasional marijuana. He hasn't used any other drugs since his MI in 2020 and specifically denies any cocaine use over the past few years. He drinks socially but does not drink heavily anymore.  The patient is here with his friend today.  The patient also reports that he is raising his 54-year-old son with the help of his friend and his friend's wife.  The patient has had regular dental care and reports his last visit was one year ago. He had a filling fall out a few  weeks ago but doesn't report any other problems.   Past Medical History:  Diagnosis Date  . Acute renal failure ([place])    a. a. during 7-05/2018 adm for  cardiac arrest, requiring HD temporarily.  . Alcohol abuse   . Anemia   . AVM (arteriovenous malformation) of colon   . Cardiac arrest ([place]) 05/22/2018   Vfib  . CHF (congestive heart failure) ([place])   . Cocaine abuse ([place])   . COPD (chronic obstructive pulmonary disease) ([place])   . Coronary artery disease    a. hx of POBA in [place]. b. out-of-hospital cardiac arrest 04/2018 (vfib/STEMI) s/p DES to prox LAD (complex admission with GIB, renal failure requiring HD, encephalopathy). c. STEMI 11/2018 after pt stopped Plavix - s/p DES to LAD.  Marland Kitchen Duodenitis   . GI bleed   . History of blood transfusion   . Hyperlipidemia   . Hypokalemia   . Hypomagnesemia   . Iron deficiency anemia   . Ischemic cardiomyopathy    a. EF <25% at time of cath/arrest 04/2018, improved to 40-45% by f/u echo 06/09/18.  . Myasthenia gravis ([place])   . Myocardial infarction ([place]) 05/22/2018   STEMI  . PAD (peripheral artery disease) ([place])    a. femoral bypass  . Shock liver    a. during 7-05/2018 adm for cardiac arrest and again in 11/2018.  Marland Kitchen Thrombocytopenia ([place]) 04/2018  . Tobacco abuse   . Ventricular fibrillation [facility])     Current Outpatient Medications  Medication Sig Dispense Refill  . atorvastatin (LIPITOR) 40 MG tablet Take 1 tablet (40 mg total) by mouth daily. 90 tablet 1  . carvedilol (COREG) 6.25 MG tablet Take 1 tablet (6.25 mg total) by mouth 2 (two) times daily with a meal. 90 tablet 3  . clopidogrel (PLAVIX) 75 MG tablet Take 1 tablet (75 mg total) by mouth daily. 90 tablet 3  . nitroGLYCERIN (NITROSTAT) 0.4 MG SL tablet Place 1 tablet (0.4 mg total) under the tongue every 5 (five) minutes as needed for chest pain (max 3 doses). 10 tablet 0  . pantoprazole (PROTONIX) 40 MG tablet Take 1 tablet (40 mg total) by mouth daily. 90 tablet 3  . sacubitril-valsartan (ENTRESTO) 24-26 MG Take 1 tablet by mouth 2 (two) times daily. 60 tablet 2  . spironolactone (ALDACTONE) 25 MG tablet Take 1 tablet (25 mg total)  by mouth daily. 90 tablet 1  . torsemide (DEMADEX) 20 MG tablet Take 1 tablet (20 mg total) by mouth daily. 90 tablet 3   No current facility-administered medications for this visit.    ALLERGIES:   Patient has no known allergies.   SOCIAL HISTORY:  The patient  reports that he has been smoking cigarettes. He has been smoking about 0.50 packs per day. He uses smokeless tobacco. He reports current alcohol use. He reports current drug use. Drugs: Cocaine, Marijuana, Methamphetamines, and Benzodiazepines.   FAMILY HISTORY:  The patient's family history includes Alzheimer's disease in his father.   REVIEW OF SYSTEMS:  Positive for fatigue, sleep apnea.   All other systems are reviewed and negative.   PHYSICAL EXAM: VS:  BP (!) 110/40   Pulse 80   Ht $R'5\' 9"'iB$  (1.753 m)   Wt 157 lb 3.2 oz (71.3 kg)   SpO2 99%   BMI 23.21 kg/m  , BMI Body mass index is 23.21 kg/m. GEN: Well nourished, well developed, in no acute distress HEENT: normal Neck: No JVD. carotids 2+ without bruits or masses Cardiac: The  heart is RRR with a 2/6 holosystolic murmur at the apex. No edema. Pedal pulses 2+ = bilaterally  Respiratory:  clear to auscultation bilaterally GI: soft, nontender, nondistended, + BS MS: no deformity or atrophy Skin: warm and dry, no rash Neuro:  Strength and sensation are intact Psych: euthymic mood, full affect   RECENT LABS: 08/23/2020: B Natriuretic Peptide 1,451.1 02/13/2021: ALT 14; Magnesium 2.1 02/27/2021: BUN 12; Creatinine, Ser 1.00; Hemoglobin 9.2; Hemoglobin 8.8; Platelets 227; Potassium 3.8; Potassium 3.8; Sodium 143; Sodium 143  02/13/2021: Chol/HDL Ratio 3.4; Cholesterol, Total 124; HDL 36; LDL Chol Calc (NIH) 78; Triglycerides 42   Estimated Creatinine Clearance: 84.4 mL/min (by C-G formula based on SCr of 1 mg/dL).   Wt Readings from Last 3 Encounters:  03/19/21 157 lb 3.2 oz (71.3 kg)  03/08/21 167 lb 9.6 oz (76 kg)  02/27/21 160 lb (72.6 kg)     CARDIAC  STUDIES:  Echo:  IMPRESSIONS    1. Carpentier III Severe central mitral regurgitation in the setting of  leaflet restriction. ERO 0.65.Marland Kitchen The mitral valve is abnormal. Severe  mitral valve regurgitation. No evidence of mitral stenosis. The mean  mitral valve gradient is 2.0 mmHg.  2. Left ventricular ejection fraction, by estimation, is 25 to 30%. The  left ventricle has severely decreased function. The left ventricle  demonstrates global hypokinesis.  3. Right ventricular systolic function is mildly reduced. The right  ventricular size is moderately enlarged.  4. Left atrial size was severely dilated. Unable to visualize LAA- study  was stopped early in the setting of respiratory depression.  5. Right atrial size was mild to moderately dilated.  6. Tricuspid valve regurgitation is severe.  7. The aortic valve was not well visualized. Aortic valve regurgitation  is not visualized. Aortic valve mean gradient measures 2.0 mmHg. Aortic  valve Vmax measures 1.05 m/s.  8. PFO Suspected.   Conclusion(s)/Recommendation(s): Limited study in the setting of  respiratory compromise. If plans for edge-to-edge repair; would repeat  study when medically optimized.    Cardiac Cath:  Conclusion    Previously placed Dist LM to Ost LAD stent (unknown type) is widely patent.  Previously placed Ost LAD to Prox LAD stent (unknown type) is widely patent.   No significant coronary obstructive disease  with widely patent tandem stents in the ostium and proximal LAD.  Moderately elevated right heart pressures with moderate pulmonary hypertension with a PA pressure 37 mm.  There is significant V wave of 42 mm in the pulmonary wedge pressure tracing consistent with the patient's severe mitral regurgitation.  LVEDP 25 mm Hg.  RECOMMENDATION: We will plan to review data with colleagues and await the formal TEE analysis.  With severe MR and's severely reduced LV function will discuss  potential candidacy for mitral clip procedure versus surgical intervention.  Coronary Findings   Diagnostic Dominance: Right  Left Main  Previously placed Dist LM to Ost LAD stent (unknown type) is widely patent.  Left Anterior Descending  Previously placed Ost LAD to Prox LAD stent (unknown type) is widely patent.  Third Septal Branch   Intervention   No interventions have been documented.  Right Heart  Right Heart Pressures RA: A-wave 11, V wave 10; mean 7 RV: 57/12 PA: 55/23; mean 37 PW: A-wave 21, V wave significantly increased at 42; mean 25  Ao: 89/51 LV: 94/25  Oxygen saturation in the pulmonary artery 47% and in the aorta 99%  Cardiac output by the Fick method 4.3 L/min with  a cardiac index of 2.3 L/min/m  PVR: 2.8 WU   Coronary Diagrams   Diagnostic Dominance: Right    Intervention    ASSESSMENT AND PLAN: 44.  55 year old male with severe, stage D, mitral regurgitation and New York Heart Association functional class III chronic systolic heart failure.  The patient describes progressive symptoms of exercise intolerance, fatigue, and exertional dyspnea.  He was hospitalized in 2020 and 2021 with acute on chronic systolic heart failure.  The patient continues to have lifestyle limiting symptoms on a background of guideline directed medical therapy with carvedilol, Entresto, spironolactone, and torsemide.   I have reviewed the natural history of mitral regurgitation with the patient and his close friend who is present today. We have discussed the limitations of medical therapy and the poor prognosis associated with symptomatic mitral regurgitation. We have also reviewed potential treatment options, including palliative medical therapy, conventional surgical mitral valve repair or replacement, and percutaneous mitral valve therapies such as edge-to-edge mitral valve approximation with MitraClip. We discussed treatment options in the context of this patient's  specific comorbid medical conditions.   I have reviewed the patient's recent heart catheterization that demonstrates wide patency of his proximal LAD stents with no other significant coronary obstruction.  Hemodynamic findings were significant for a large V wave of 42 mmHg, mean pulmonary artery pressure 37 mmHg, and low mixed venous oxygen saturation of 47% with Fick cardiac output and index of 4.3 and 2.3, respectively.  TEE images are also reviewed.  The TEE study is limited because of severe hypoxemia that occurred during the procedure.  The study demonstrates severe LV dysfunction with LVEF  25 to 30%.  There is severe mitral regurgitation with leaflet mall coaptation, posterior leaflet restriction, and annular dilatation.  I suspect the primary mechanism for this patient's mitral regurgitation is Carpentier type IIIb.  We will need to consider whether a complete TEE should be performed with anesthesia support before deciding on definitive treatment of his MR, versus proceeding with procedural TEE guidance with a complete study at that time. I suspect there is enough information from the current TEE to make an informed treatment decision. After discussion of treatment options, I have recommended a multidisciplinary approach with consultation in the advanced heart failure clinic.  We had specific discussion regarding transcatheter edge-to-edge mitral valve repair and data supporting its use in patients with systolic heart failure due to both ischemic and nonischemic cardiomyopathy (coapt trial).  Extensive records were reviewed as part of today's evaluation, and the patient was seen by Dr. Prescott Gum in October 2021 during a hospitalization for heart failure.  He discussed high risk mitral valve repair or replacement and felt the patient would need reevaluation in an office-based setting after undergoing complete work-up.  Now that his work-up has been completed, we will discuss his case amongst the  multidisciplinary heart valve team and the patient will undergo advanced heart failure consultation as well.  I will see him back after this occurs to further discuss consideration of transcatheter edge-to-edge mitral valve repair if felt to be apropriate.  In the meantime he will continue on his current medical regimen.  Deatra James 03/19/2021 12:02 PM     [address] [place] [address]  [phone] (office) [phone] (fax)

## 2021-03-26 ENCOUNTER — Ambulatory Visit: Payer: No Typology Code available for payment source | Admitting: General Practice

## 2021-03-27 ENCOUNTER — Other Ambulatory Visit: Payer: Self-pay | Admitting: Cardiovascular Disease

## 2021-04-04 ENCOUNTER — Encounter (HOSPITAL_COMMUNITY): Payer: Self-pay | Admitting: Cardiology

## 2021-04-04 ENCOUNTER — Ambulatory Visit (HOSPITAL_COMMUNITY)
Admission: RE | Admit: 2021-04-04 | Discharge: 2021-04-04 | Disposition: A | Discharge status: Home / Self Care | Payer: No Typology Code available for payment source | Source: Ambulatory Visit | Attending: Cardiology | Admitting: Cardiology

## 2021-04-04 ENCOUNTER — Other Ambulatory Visit: Payer: Self-pay

## 2021-04-04 ENCOUNTER — Other Ambulatory Visit (HOSPITAL_COMMUNITY): Payer: Self-pay

## 2021-04-04 VITALS — BP 108/60 | HR 88 | Wt 161.8 lb

## 2021-04-04 DIAGNOSIS — I340 Nonrheumatic mitral (valve) insufficiency: Secondary | ICD-10-CM | POA: Insufficient documentation

## 2021-04-04 DIAGNOSIS — I110 Hypertensive heart disease with heart failure: Secondary | ICD-10-CM | POA: Diagnosis not present

## 2021-04-04 DIAGNOSIS — I509 Heart failure, unspecified: Secondary | ICD-10-CM | POA: Diagnosis not present

## 2021-04-04 DIAGNOSIS — I2510 Atherosclerotic heart disease of native coronary artery without angina pectoris: Secondary | ICD-10-CM | POA: Insufficient documentation

## 2021-04-04 DIAGNOSIS — R0600 Dyspnea, unspecified: Secondary | ICD-10-CM | POA: Insufficient documentation

## 2021-04-04 DIAGNOSIS — I255 Ischemic cardiomyopathy: Secondary | ICD-10-CM | POA: Insufficient documentation

## 2021-04-04 DIAGNOSIS — Z7901 Long term (current) use of anticoagulants: Secondary | ICD-10-CM | POA: Diagnosis not present

## 2021-04-04 DIAGNOSIS — G4733 Obstructive sleep apnea (adult) (pediatric): Secondary | ICD-10-CM | POA: Insufficient documentation

## 2021-04-04 DIAGNOSIS — Z955 Presence of coronary angioplasty implant and graft: Secondary | ICD-10-CM | POA: Insufficient documentation

## 2021-04-04 DIAGNOSIS — Z79899 Other long term (current) drug therapy: Secondary | ICD-10-CM | POA: Insufficient documentation

## 2021-04-04 DIAGNOSIS — F17210 Nicotine dependence, cigarettes, uncomplicated: Secondary | ICD-10-CM | POA: Insufficient documentation

## 2021-04-04 DIAGNOSIS — Z7902 Long term (current) use of antithrombotics/antiplatelets: Secondary | ICD-10-CM | POA: Insufficient documentation

## 2021-04-04 DIAGNOSIS — I5022 Chronic systolic (congestive) heart failure: Secondary | ICD-10-CM | POA: Insufficient documentation

## 2021-04-04 DIAGNOSIS — I252 Old myocardial infarction: Secondary | ICD-10-CM | POA: Insufficient documentation

## 2021-04-04 LAB — BASIC METABOLIC PANEL
Anion gap: 13 (ref 5–15)
BUN: 22 mg/dL — ABNORMAL HIGH (ref 6–20)
CO2: 25 mmol/L (ref 22–32)
Calcium: 9.8 mg/dL (ref 8.9–10.3)
Chloride: 97 mmol/L — ABNORMAL LOW (ref 98–111)
Creatinine, Ser: 1.21 mg/dL (ref 0.61–1.24)
GFR, Estimated: >60 mL/min (ref >60)
Glucose, Bld: 90 mg/dL (ref 70–99)
Potassium: 4.4 mmol/L (ref 3.5–5.1)
Sodium: 135 mmol/L (ref 135–145)

## 2021-04-04 LAB — BRAIN NATRIURETIC PEPTIDE: B Natriuretic Peptide: 285.9 pg/mL — ABNORMAL HIGH (ref 0.0–100.0)

## 2021-04-04 MED ORDER — EMPAGLIFLOZIN 10 MG PO TABS: 10.0000 mg | ORAL_TABLET | Freq: Every day | ORAL | 0 refills | Status: DC

## 2021-04-04 NOTE — Progress Notes (Signed)
Medication Samples have been provided to the patient.  Drug name: Jardiance       Strength: 10 mg        Qty: 4  LOT: [id number]  Exp.Date: 07/23  Dosing instructions: Take 1 tablet daily  The patient has been instructed regarding the correct time, dose, and frequency of taking this medication, including desired effects and most common side effects.   Howard Thompson 12:27 PM 04/04/2021

## 2021-04-04 NOTE — Patient Instructions (Addendum)
Labs done today, your results will be available in MyChart, we will contact you for abnormal readings.  Lab work follow up  in 10 days   Start Jardiance 10 mg (1 tablet) daily  Your physician recommends that you schedule a follow-up appointment in: 2 months  If you have any questions or concerns before your next appointment please send [place] a message through [place] or call our office at [phone].    TO LEAVE A MESSAGE FOR THE NURSE SELECT OPTION 2, PLEASE LEAVE A MESSAGE INCLUDING: YOUR NAME DATE OF BIRTH CALL BACK NUMBER REASON FOR CALL**this is important as we prioritize the call backs  YOU WILL RECEIVE A CALL BACK THE SAME DAY AS LONG AS YOU CALL BEFORE 4:00 PM  At the [facility], you and your health needs are our priority. As part of our continuing mission to provide you with exceptional heart care, we have created designated Provider Care Teams. These Care Teams include your primary Cardiologist (physician) and Advanced Practice Providers (APPs- Physician Assistants and Nurse Practitioners) who all work together to provide you with the care you need, when you need it.   You may see any of the following providers on your designated Care Team at your next follow up: Dr Glori Bickers Dr Loralie Champagne Dr Patrice Paradise, NP Lyda Jester, [place] Ginnie Smart Audry Riles, PharmD   Please be sure to bring in all your medications bottles to every appointment.

## 2021-04-05 ENCOUNTER — Other Ambulatory Visit (HOSPITAL_COMMUNITY): Payer: Self-pay

## 2021-04-05 ENCOUNTER — Telehealth (HOSPITAL_COMMUNITY): Payer: Self-pay | Admitting: Pharmacy Technician

## 2021-04-05 NOTE — Telephone Encounter (Signed)
Patient Advocate Encounter   Received notification from [employer] that prior authorization for Howard Thompson is required.   PA submitted on CoverMyMeds Key [id number] Status is pending   Will continue to follow.

## 2021-04-05 NOTE — Progress Notes (Signed)
PCP: Jamesetta Geralds, MD Cardiology: Dr. Claiborne Billings HF Cardiology: Dr. Aundra Dubin  55 y.o. with history of CAD, ischemic cardiomyopathy, mitral regurgitation, PAD, smoking, polysubstance abuse was referred by Dr. Burt Knack for CHF clinic evaluation as part of Mitraclip workup.  Patient had left femoral endarterectomy in 2016 and later left femoral artery bypass in 2017 at [facility].  In 2019, he had anterior STEMI complicated by cardiac arrest.  EF by echo at this time was 25-30%.  Hospitalization was complicated by GI bleeding and AKI requiring temporary HD.  He had DES to proximal LAD. In 2/20, he had recurrent anterior STEMI with totally occluded proximal LAD treated with aspiration thrombectomy and PCI.    He additionally has myasthenia gravis and had a thymectomy in the past (required sternotomy).    He has a history of cocaine, amphetamine, and marijuana abuse (none x 2 years). He is an active smoker.   TTE showed significant MR recently, so TEE was done in 5/22. This showed LV EF 25-30%, moderate RV enlargement with mildly decreased RV systolic function, severe MR with ERO 0.65.  Right and left heart cath in 5/22 showed elevated PCWP with prominent V waves, preserved cardiac output, patent LAD stents.  Patient has seen Dr. Burt Knack regarding Mitraclip placement.    Patient reports exertional dyspnea with moderate activity such as walking up a flight of stairs. No orthopnea/PND.  He gets lightheaded occasionally if he stands too fast but generally not lightheaded.  He takes all his meds.  He has significant generalized fatigue.  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  Originally from [place].  He has a 55 year old son.    Labs (5/22): K 3.9, creatinine 1.0.   ECG (4/22, personally reviewed): NSR, possible old anterior MI.    PMH: 1. CAD: Anterior STEMI 2019.  Totally occluded proximal LAD treated with DES.  - Recurrent anterior STEMI 2/20 with proximal LAD occluded, patient  had aspiration thrombectomy and repeat PCI.   - LHC (5/22): Patent LAD stents, nonobstructive disease.  2. PAD: H/o left femoral endarterectomy 2016 and later femoral artery bypass 2017.  3. COPD: Active smoker.  4. Myasthenia gravis s/p thymectomy.  5. OSA: Unable to tolerate CPAP.  6. Chronic systolic CHF: Ischemic cardiomyopathy.  - RHC (5/22) with mean RA 5, PA 55/23 mean 37, mean PCWP 25 with v waves to 42, CI 2.3 - TEE (5/22): LV EF 25-30%, moderate RV enlargement with mildly decreased RV systolic function, severe MR with ERO 0.65.  7. Mitral regurgitation: See 5/22 TEE above.  Suspect infarct-related MR.  8. Polysubstance abuse: Cocaine, marijuana, amphetamines.  He has been off all of these for 2 years.  9. Hypertension 10. Hyperlipidemia  Social History   Socioeconomic History   Marital status: Divorced    Spouse name: Not on file   Number of children: Not on file   Years of education: Not on file   Highest education level: Not on file  Occupational History    Employer: [employer]  Tobacco Use   Smoking status: Every Day    Packs/day: 0.50    Pack years: 0.00    Types: Cigarettes   Smokeless tobacco: Current  Vaping Use   Vaping Use: Never used  Substance and Sexual Activity   Alcohol use: Yes    Comment: none in past several weeks, but normally about 5 beers a week, endorses binge drinking at celebrations   Drug use: Yes  Types: Cocaine, Marijuana, Methamphetamines, Benzodiazepines    Comment: vaguely discusses useage, but quickly changes subject   Sexual activity: Yes    Partners: Female  Other Topics Concern   Not on file  Social History Narrative   Divorced   Employed at [employer]   Social Determinants of Health   Financial Resource Strain: Not on file  Food Insecurity: Not on file  Transportation Needs: Not on file  Physical Activity: Not on file  Stress: Not on file  Social Connections: Not on  file  Intimate Partner Violence: Not on file   Family History  Problem Relation Age of Onset   Alzheimer's disease Father    ROS: All systems reviewed and negative except as per HPI.   Current Outpatient Medications  Medication Sig Dispense Refill   atorvastatin (LIPITOR) 40 MG tablet Take 1 tablet (40 mg total) by mouth daily. 90 tablet 1   carvedilol (COREG) 6.25 MG tablet Take 1 tablet (6.25 mg total) by mouth 2 (two) times daily with a meal. 90 tablet 3   clopidogrel (PLAVIX) 75 MG tablet Take 1 tablet (75 mg total) by mouth daily. 90 tablet 3   empagliflozin (JARDIANCE) 10 MG TABS tablet Take 1 tablet (10 mg total) by mouth daily before breakfast. 90 tablet 0   nitroGLYCERIN (NITROSTAT) 0.4 MG SL tablet Place 1 tablet (0.4 mg total) under the tongue every 5 (five) minutes as needed for chest pain (max 3 doses). 10 tablet 0   pantoprazole (PROTONIX) 40 MG tablet Take 1 tablet (40 mg total) by mouth daily. 90 tablet 3   sacubitril-valsartan (ENTRESTO) 24-26 MG Take 1 tablet by mouth 2 (two) times daily. 60 tablet 2   spironolactone (ALDACTONE) 25 MG tablet TAKE 1 TABLET (25 MG TOTAL) BY MOUTH DAILY. 90 tablet 3   torsemide (DEMADEX) 20 MG tablet Take 1 tablet (20 mg total) by mouth daily. 90 tablet 3   No current facility-administered medications for this encounter.   BP 108/60   Pulse 88   Wt 73.4 kg (161 lb 12.8 oz)   SpO2 100%   BMI 23.89 kg/m  General: NAD Neck: No JVD, no thyromegaly or thyroid nodule.  Lungs: Clear to auscultation bilaterally with normal respiratory effort. CV: Nondisplaced PMI.  Heart regular S1/S2, no S3/S4, 3/6 HSM apex.  No peripheral edema.  No carotid bruit.  Normal pedal pulses.  Abdomen: Soft, nontender, no hepatosplenomegaly, no distention.  Skin: Intact without lesions or rashes.  Neurologic: Alert and oriented x 3.  Psych: Normal affect. Extremities: No clubbing or cyanosis.  HEENT: Normal.   Assessment/Plan: 1. Chronic systolic CHF:  Ischemic cardiomyopathy.  TEE in 5/22 with LV EF 25-30%, moderate RV enlargement with mildly decreased RV systolic function, severe MR with ERO 0.65.  On exam, he is not volume overloaded.  He has NYHA class III symptoms.  I suspect that mitral regurgitation plays a role in his symptomatology.   [place] in 5/22 showed preserved cardiac output.  RA pressure was normal but PCWP was elevated with prominent v-waves; consistent with severe mitral regurgitation.  On exam today, he does not look significantly volume overloaded.  - Continue Coreg 6.25 mg bid.  - Continue Entresto 24/26 bid.  - Continue spironolactone 25 mg daily.  - Keep torsemide at 20 mg daily.  - I will add Farxiga 10 mg daily. BMET today and again in 10 days.  - He does not have an ICD. Not candidate for CRT  with narrow QRS.  Needs repeat echo after medication titration and hopefully Mitraclip, ICD if EF < 35%.  2. CAD: Anterior STEMI in 2019 and again in 2/20.  [place] 5/22 with patent LAD stents.  - Continue Plavix.  - Continue atorvastatin 40 mg daily.  3. OSA: Has not tolerate CPAP.  4. Mitral regurgitation: I reviewed TEE, patient has severe functional mitral regurgitation (RHC is consistent with prominent v-waves in the PCWP tracing), the valve itself appear intact.  I suspect annular dilatation with posterior leaflet restriction and malcoaptation is the mechanism. He has had a prior sternotomy for thymectomy.  Based on his risk and the available data, I think that he would be best served by Mitraclip.  He fits the COAPT cohort.   - Will need to review with Dr. Burt Knack whether we have enough views on the 5/22 TEE to proceed with Mitraclip planning or need additional views.  5. Active smoking: I strongly encouraged him to quit.  6. PAD: Continue statin, Plavix.     He will followup in 2 months with me, ideally after Mitraclip.   Loralie Champagne 04/05/2021

## 2021-04-09 ENCOUNTER — Other Ambulatory Visit (HOSPITAL_COMMUNITY): Payer: Self-pay

## 2021-04-09 NOTE — Telephone Encounter (Signed)
Advanced Heart Failure Patient Advocate Encounter  Prior Authorization for Vania Rea has been approved.    PA#  [id number] Effective dates: 04/05/21 through 04/05/22  Patients co-pay is $44.99 (90 day supply) patient can use a co-pay card, which should have been given to him at his visit.  Charlann Boxer, CPhT

## 2021-04-15 ENCOUNTER — Other Ambulatory Visit: Payer: Self-pay

## 2021-04-15 ENCOUNTER — Ambulatory Visit (HOSPITAL_COMMUNITY)
Admission: RE | Admit: 2021-04-15 | Discharge: 2021-04-15 | Disposition: A | Discharge status: Home / Self Care | Payer: No Typology Code available for payment source | Source: Ambulatory Visit | Attending: Internal Medicine | Admitting: Internal Medicine

## 2021-04-15 DIAGNOSIS — I509 Heart failure, unspecified: Secondary | ICD-10-CM | POA: Diagnosis present

## 2021-04-15 LAB — BASIC METABOLIC PANEL
Anion gap: 11 (ref 5–15)
BUN: 23 mg/dL — ABNORMAL HIGH (ref 6–20)
CO2: 27 mmol/L (ref 22–32)
Calcium: 9.8 mg/dL (ref 8.9–10.3)
Chloride: 101 mmol/L (ref 98–111)
Creatinine, Ser: 1.29 mg/dL — ABNORMAL HIGH (ref 0.61–1.24)
GFR, Estimated: >60 mL/min (ref >60)
Glucose, Bld: 106 mg/dL — ABNORMAL HIGH (ref 70–99)
Potassium: 4.3 mmol/L (ref 3.5–5.1)
Sodium: 139 mmol/L (ref 135–145)

## 2021-04-19 ENCOUNTER — Telehealth: Payer: Self-pay

## 2021-04-19 NOTE — Telephone Encounter (Signed)
I spoke with the pt and made him aware that he will require dental clearance before proceeding with MitraClip procedure.  The next scheduled date that Dr Burt Knack will be performing this surgery is 7/28.  I advised the pt that we would like to perform surgery that day if dental clearance has been obtained.  The pt plans to contact Dr Raynelle Dick office today to arrange appointment.

## 2021-04-28 ENCOUNTER — Other Ambulatory Visit: Payer: Self-pay | Admitting: Cardiovascular Disease

## 2021-05-16 ENCOUNTER — Other Ambulatory Visit: Payer: Self-pay

## 2021-05-16 ENCOUNTER — Encounter: Payer: Self-pay | Admitting: *Deleted

## 2021-05-16 ENCOUNTER — Ambulatory Visit: Payer: No Typology Code available for payment source | Admitting: Cardiovascular Disease

## 2021-05-16 VITALS — BP 96/68 | HR 60 | Ht 69.0 in | Wt 160.4 lb

## 2021-05-16 DIAGNOSIS — I340 Nonrheumatic mitral (valve) insufficiency: Secondary | ICD-10-CM

## 2021-05-16 NOTE — H&P (View-Only) (Signed)
/ Cardiology Office Note:    Date:  05/17/2021   ID:  Howard Thompson, DOB 1966-05-22, MRN [id number]  PCP:  Jamesetta Geralds, MD   [facility] Providers Cardiologist:  Shelva Majestic, MD     Referring MD: Jamesetta Geralds, *   Chief Complaint  Patient presents with   Mitral Regurgitation     History of Present Illness:    Howard Thompson is a 55 y.o. male with a hx of severe mitral regurgitation, presenting for follow-up evaluation.  He was seen Mar 19, 2021 for initial consultation.  To summarize his history, he presented with an anterior STEMI and out of hospital ventricular fibrillation cardiac arrest in 2019.  He was treated with primary PCI of the LAD and was noted to have severe LV dysfunction with LVEF less than 30%.  He had multiple complications during that initial hospital stay, but ultimately recovered.  Comorbid conditions include COPD, gastrointestinal bleeding, hypertension, hyperlipidemia, myasthenia gravis status post thymectomy, and past history of polysubstance abuse.  The patient had recurrent anterior STEMI in February 2020 treated again with PCI including aspiration thrombectomy.  He reestablished with cardiology in April 2022 and he ultimately underwent right and left heart catheterization and transesophageal echo to assess further management options of severe LV dysfunction, coronary artery disease, and severe mitral regurgitation.  Cardiac catheterization demonstrated patency of his coronary arteries with no significant obstructive disease.  He had large V waves of 42 mmHg.  Transesophageal echo confirmed severe mitral regurgitation but the study was quite limited because of severe oxygen desaturation.  At the time of his initial visit, he was felt to suffer from [place] functional class III symptoms of chronic systolic heart failure with progressive exercise intolerance fatigue and exertional dyspnea.  The patient was referred for advanced heart  failure consultation and was seen by Dr. Aundra Dubin April 04, 2021.  He was on a good medical program already established with carvedilol, Entresto, spironolactone, and torsemide.  Wilder Glade was added to his regimen.  He was felt to be an appropriate candidate for transcatheter edge-to-edge mitral valve repair and he returns today for follow-up discussion.  The patient is here with his friend today.  He reports no significant change in his symptoms.  He was just evaluated by his dentist and underwent extraction of 2 teeth without complication.  He continues to have fatigue and shortness of breath with physical activity.  He has not had further symptoms of orthopnea or PND.  He is had no recent chest pain, leg swelling, lightheadedness, or syncope.  He denies heart palpitations.  He feels that he has had significant improvement since starting [place].  Past Medical History:  Diagnosis Date   Acute renal failure ([place])    a. a. during 7-05/2018 adm for cardiac arrest, requiring HD temporarily.   Alcohol abuse    Anemia    AVM (arteriovenous malformation) of colon    Cardiac arrest ([place]) 05/22/2018   Vfib   CHF (congestive heart failure) (H[facility])    Cocaine abuse (H[facility])    COPD (chronic obstructive pulmonary disease) (H[facility])    Coronary artery disease    a. hx of POBA in [place]. b. out-of-hospital cardiac arrest 04/2018 (vfib/STEMI) s/p DES to prox LAD (complex admission with GIB, renal failure requiring HD, encephalopathy). c. STEMI 11/2018 after pt stopped Plavix - s/p DES to LAD.   Duodenitis    GI bleed    History of blood transfusion    Hyperlipidemia    Hypokalemia  Hypomagnesemia    Iron deficiency anemia    Ischemic cardiomyopathy    a. EF <25% at time of cath/arrest 04/2018, improved to 40-45% by f/u echo 06/09/18.   Myasthenia gravis ([place])    Myocardial infarction ([place]) 05/22/2018   STEMI   PAD (peripheral artery disease) ([place])    a. femoral bypass   Shock liver    a. during 7-05/2018 adm for  cardiac arrest and again in 11/2018.   Thrombocytopenia ([place]) 04/2018   Tobacco abuse    Ventricular fibrillation [facility])     Past Surgical History:  Procedure Laterality Date   ATHERECTOMY     BIOPSY  07/16/2018   Procedure: BIOPSY;  Surgeon: Gatha Mayer, MD;  Location: [place];  Service: Endoscopy;;   CARDIAC CATHETERIZATION  05/22/2018   stent   COLONOSCOPY WITH PROPOFOL N/A 07/16/2018   Procedure: COLONOSCOPY WITH PROPOFOL;  Surgeon: Gatha Mayer, MD;  Location: [place];  Service: Endoscopy;  Laterality: N/A;   CORONARY ANGIOPLASTY     CORONARY STENT INTERVENTION N/A 05/22/2018   Procedure: CORONARY STENT INTERVENTION;  Surgeon: Lorretta Harp, MD;  Location: [place] CV LAB;  Service: Cardiovascular;  Laterality: N/A;   CORONARY/GRAFT ACUTE MI REVASCULARIZATION N/A 12/16/2018   Procedure: CORONARY/GRAFT ACUTE MI REVASCULARIZATION;  Surgeon: Troy Sine, MD;  Location: [place] CV LAB;  Service: Cardiovascular;  Laterality: N/A;   ENTEROSCOPY N/A 01/22/2019   Procedure: ENTEROSCOPY;  Surgeon: Thornton Park, MD;  Location: [place];  Service: Gastroenterology;  Laterality: N/A;   ESOPHAGOGASTRODUODENOSCOPY (EGD) WITH PROPOFOL N/A 06/10/2018   Procedure: ESOPHAGOGASTRODUODENOSCOPY (EGD) WITH PROPOFOL;  Surgeon: Ladene Artist, MD;  Location: [facility];  Service: Endoscopy;  Laterality: N/A;   ESOPHAGOGASTRODUODENOSCOPY (EGD) WITH PROPOFOL N/A 07/16/2018   Procedure: ESOPHAGOGASTRODUODENOSCOPY (EGD) WITH PROPOFOL;  Surgeon: Gatha Mayer, MD;  Location: [place];  Service: Endoscopy;  Laterality: N/A;   FEMORAL ARTERY - POPLITEAL ARTERY BYPASS GRAFT     GIVENS CAPSULE STUDY N/A 01/20/2019   Procedure: GIVENS CAPSULE STUDY;  Surgeon: Gatha Mayer, MD;  Location: [place];  Service: Endoscopy;  Laterality: N/A;   HOT HEMOSTASIS N/A 07/16/2018   Procedure: HOT HEMOSTASIS (ARGON PLASMA COAGULATION/BICAP);  Surgeon: Gatha Mayer, MD;  Location:  [facility] ENDOSCOPY;  Service: Endoscopy;  Laterality: N/A;   HOT HEMOSTASIS N/A 01/22/2019   Procedure: HOT HEMOSTASIS (ARGON PLASMA COAGULATION/BICAP);  Surgeon: Thornton Park, MD;  Location: [place];  Service: Gastroenterology;  Laterality: N/A;   IABP INSERTION N/A 12/16/2018   Procedure: IABP Insertion;  Surgeon: Troy Sine, MD;  Location: [place] CV LAB;  Service: Cardiovascular;  Laterality: N/A;   IR FLUORO GUIDE CV LINE RIGHT  05/31/2018   IR REMOVAL TUN CV CATH W/O FL  06/21/2018   IR US GUIDE VASC ACCESS RIGHT  05/31/2018   LEFT HEART CATH AND CORONARY ANGIOGRAPHY N/A 05/22/2018   Procedure: LEFT HEART CATH AND CORONARY ANGIOGRAPHY;  Surgeon: Lorretta Harp, MD;  Location: [place] CV LAB;  Service: Cardiovascular;  Laterality: N/A;   LEFT HEART CATH AND CORONARY ANGIOGRAPHY N/A 12/16/2018   Procedure: LEFT HEART CATH AND CORONARY ANGIOGRAPHY;  Surgeon: Troy Sine, MD;  Location: [place] CV LAB;  Service: Cardiovascular;  Laterality: N/A;   RIGHT/LEFT HEART CATH AND CORONARY ANGIOGRAPHY N/A 02/27/2021   Procedure: RIGHT/LEFT HEART CATH AND CORONARY ANGIOGRAPHY;  Surgeon: Troy Sine, MD;  Location: [place] CV LAB;  Service: Cardiovascular;  Laterality: N/A;   TEE WITHOUT CARDIOVERSION  N/A 03-25-21   Procedure: TRANSESOPHAGEAL ECHOCARDIOGRAM (TEE);  Surgeon: Werner Lean, MD;  Location: [facility] ENDOSCOPY;  Service: Cardiovascular;  Laterality: N/A;    Current Medications: Current Meds  Medication Sig   atorvastatin (LIPITOR) 40 MG tablet Take 1 tablet (40 mg total) by mouth daily.   azithromycin (ZITHROMAX) 250 MG tablet Take 250 mg by mouth as directed.   carvedilol (COREG) 6.25 MG tablet Take 1 tablet (6.25 mg total) by mouth 2 (two) times daily with a meal.   clopidogrel (PLAVIX) 75 MG tablet Take 1 tablet (75 mg total) by mouth daily.   empagliflozin (JARDIANCE) 10 MG TABS tablet Take 1 tablet (10 mg total) by mouth daily before breakfast.    nitroGLYCERIN (NITROSTAT) 0.4 MG SL tablet Place 1 tablet (0.4 mg total) under the tongue every 5 (five) minutes as needed for chest pain (max 3 doses).   pantoprazole (PROTONIX) 40 MG tablet Take 1 tablet (40 mg total) by mouth daily.   sacubitril-valsartan (ENTRESTO) 24-26 MG Take 1 tablet by mouth 2 (two) times daily.   spironolactone (ALDACTONE) 25 MG tablet TAKE 1 TABLET (25 MG TOTAL) BY MOUTH DAILY.   torsemide (DEMADEX) 20 MG tablet Take 1 tablet (20 mg total) by mouth daily.     Allergies:   Patient has no known allergies.   Social History   Socioeconomic History   Marital status: Divorced    Spouse name: Not on file   Number of children: Not on file   Years of education: Not on file   Highest education level: Not on file  Occupational History    Employer: [employer]  Tobacco Use   Smoking status: Every Day    Packs/day: 0.50    Types: Cigarettes   Smokeless tobacco: Current  Vaping Use   Vaping Use: Never used  Substance and Sexual Activity   Alcohol use: Yes    Comment: none in past several weeks, but normally about 5 beers a week, endorses binge drinking at celebrations   Drug use: Yes    Types: Cocaine, Marijuana, Methamphetamines, Benzodiazepines    Comment: vaguely discusses useage, but quickly changes subject   Sexual activity: Yes    Partners: Female  Other Topics Concern   Not on file  Social History Narrative   Divorced   Employed at [employer] abuse   Social Determinants of Health   Financial Resource Strain: Not on file  Food Insecurity: Not on file  Transportation Needs: Not on file  Physical Activity: Not on file  Stress: Not on file  Social Connections: Not on file     Family History: The patient's family history includes Alzheimer's disease in his father.  ROS:   Please see the history of present illness.    All other systems reviewed and are negative.  EKGs/Labs/Other Studies Reviewed:     The following studies were reviewed today: TEE 03-25-21: IMPRESSIONS     1. Carpentier III Severe central mitral regurgitation in the setting of  leaflet restriction. ERO 0.65.[place] Kitchen The mitral valve is abnormal. Severe  mitral valve regurgitation. No evidence of mitral stenosis. The mean  mitral valve gradient is 2.0 mmHg.   2. Left ventricular ejection fraction, by estimation, is 25 to 30%. The  left ventricle has severely decreased function. The left ventricle  demonstrates global hypokinesis.   3. Right ventricular systolic function is mildly reduced. The right  ventricular size is moderately enlarged.   4. Left atrial size  was severely dilated. Unable to visualize LAA- study  was stopped early in the setting of respiratory depression.   5. Right atrial size was mild to moderately dilated.   6. Tricuspid valve regurgitation is severe.   7. The aortic valve was not well visualized. Aortic valve regurgitation  is not visualized. Aortic valve mean gradient measures 2.0 mmHg. Aortic  valve Vmax measures 1.05 m/s.   8. PFO Suspected.   Conclusion(s)/Recommendation(s): Limited study in the setting of  respiratory compromise. If plans for edge-to-edge repair; would repeat  study when medically optimized.   EKG:  EKG is not ordered today.   Recent Labs: 02/13/2021: ALT 14; Magnesium 2.1 02/27/2021: Hemoglobin 9.2; Hemoglobin 8.8; Platelets 227 04/04/2021: B Natriuretic Peptide 285.9 04/15/2021: BUN 23; Creatinine, Ser 1.29; Potassium 4.3; Sodium 139  Recent Lipid Panel    Component Value Date/Time   CHOL 124 02/13/2021 1637   TRIG 42 02/13/2021 1637   HDL 36 (L) 02/13/2021 1637   CHOLHDL 3.4 02/13/2021 1637   CHOLHDL 3.6 12/17/2018 0237   VLDL 14 12/17/2018 0237   LDLCALC 78 02/13/2021 1637     Risk Assessment/Calculations:     STS Risk Score Isolated MV Repair: Risk of Mortality: 1.386% Renal Failure: 1.631% Permanent Stroke: 0.412% Prolonged Ventilation: 8.433% DSW  Infection: 0.046% Reoperation: 4.169% Morbidity or Mortality: 13.085% Short Length of Stay: 33.456% Long Length of Stay: 5.101%  Isolated mitral valve replacement: Risk of Mortality: 2.071% Renal Failure: 3.483% Permanent Stroke: 0.758% Prolonged Ventilation: 10.377% DSW Infection: 0.088% Reoperation: 4.452% Morbidity or Mortality: 15.670% Short Length of Stay: 22.998% Long Length of Stay: 8.152%      Physical Exam:    VS:  BP 96/68   Pulse 60   Ht 5\' 9"  (1.753 m)   Wt 160 lb 6.4 oz (72.8 kg)   BMI 23.69 kg/m     Wt Readings from Last 3 Encounters:  05/16/21 160 lb 6.4 oz (72.8 kg)  04/04/21 161 lb 12.8 oz (73.4 kg)  03/19/21 157 lb 3.2 oz (71.3 kg)     GEN:  Well nourished, well developed in no acute distress HEENT: Normal NECK: No JVD; No carotid bruits LYMPHATICS: No lymphadenopathy CARDIAC: RRR, 2/6 systolic murmur at the apex/axilla RESPIRATORY:  Clear to auscultation without rales, wheezing or rhonchi  ABDOMEN: Soft, non-tender, non-distended MUSCULOSKELETAL:  No edema; No deformity  SKIN: Warm and dry NEUROLOGIC:  Alert and oriented x 3 PSYCHIATRIC:  Normal affect   ASSESSMENT:    1. Severe mitral regurgitation    PLAN:    In order of problems listed above:  55 year old gentleman with chronic ischemic cardiomyopathy, chronic systolic heart failure [place] Heart Association functional class IIIb, and severe nonrheumatic mitral regurgitation.  The patient is on optimal medical therapy as outlined above.  He has been evaluated by a multidisciplinary team of specialists, including formal advanced heart failure consultation.  He is felt to be an appropriate candidate for transcatheter edge-to-edge mitral valve repair based on available clinical trial data ([place]).  We discussed the transcatheter edge-to-edge mitral valve repair procedure and reviewed procedural indications, potential risks, and alternatives.  Specific risks include vascular  injury, bleeding, infection, cardiac injury with tamponade, need for emergent pericardiocentesis, need for emergent cardiac surgery, stroke, myocardial infarction, device embolization, mitral valve injury, cardiac arrhythmia, and death.  The patient understands these serious risks occur at a frequency of approximately 1%.  He provides full informed consent for the procedure.  He is given specific instructions regarding his medical therapy  with respect to periprocedural management.  We discussed typical recovery from transcatheter edge-to-edge mitral valve repair involving 1 night in the hospital.  All of his questions were answered to the best of my ability.  The patient is scheduled for surgery May 23, 2021.  Medication Adjustments/Labs and Tests Ordered: Current medicines are reviewed at length with the patient today.  Concerns regarding medicines are outlined above.  No orders of the defined types were placed in this encounter.  No orders of the defined types were placed in this encounter.   Patient Instructions  See letter provided today.   Signed, Sherren Mocha, MD  05/17/2021 7:38 AM    [place]

## 2021-05-16 NOTE — Progress Notes (Signed)
/ Cardiology Office Note:    Date:  05/17/2021   ID:  Howard Thompson, DOB 18-May-1966, MRN [id number]  PCP:  Jamesetta Geralds, MD   [facility] Providers Cardiologist:  Shelva Majestic, MD     Referring MD: Jamesetta Geralds, *   Chief Complaint  Patient presents with   Mitral Regurgitation     History of Present Illness:    Howard Thompson is a 55 y.o. male with a hx of severe mitral regurgitation, presenting for follow-up evaluation.  He was seen Mar 19, 2021 for initial consultation.  To summarize his history, he presented with an anterior STEMI and out of hospital ventricular fibrillation cardiac arrest in 2019.  He was treated with primary PCI of the LAD and was noted to have severe LV dysfunction with LVEF less than 30%.  He had multiple complications during that initial hospital stay, but ultimately recovered.  Comorbid conditions include COPD, gastrointestinal bleeding, hypertension, hyperlipidemia, myasthenia gravis status post thymectomy, and past history of polysubstance abuse.  The patient had recurrent anterior STEMI in February 2020 treated again with PCI including aspiration thrombectomy.  He reestablished with cardiology in April 2022 and he ultimately underwent right and left heart catheterization and transesophageal echo to assess further management options of severe LV dysfunction, coronary artery disease, and severe mitral regurgitation.  Cardiac catheterization demonstrated patency of his coronary arteries with no significant obstructive disease.  He had large V waves of 42 mmHg.  Transesophageal echo confirmed severe mitral regurgitation but the study was quite limited because of severe oxygen desaturation.  At the time of his initial visit, he was felt to suffer from [place] functional class III symptoms of chronic systolic heart failure with progressive exercise intolerance fatigue and exertional dyspnea.  The patient was referred for advanced heart  failure consultation and was seen by Dr. Aundra Dubin April 04, 2021.  He was on a good medical program already established with carvedilol, Entresto, spironolactone, and torsemide.  Wilder Glade was added to his regimen.  He was felt to be an appropriate candidate for transcatheter edge-to-edge mitral valve repair and he returns today for follow-up discussion.  The patient is here with his friend today.  He reports no significant change in his symptoms.  He was just evaluated by his dentist and underwent extraction of 2 teeth without complication.  He continues to have fatigue and shortness of breath with physical activity.  He has not had further symptoms of orthopnea or PND.  He is had no recent chest pain, leg swelling, lightheadedness, or syncope.  He denies heart palpitations.  He feels that he has had significant improvement since starting [place].  Past Medical History:  Diagnosis Date   Acute renal failure ([place])    a. a. during 7-05/2018 adm for cardiac arrest, requiring HD temporarily.   Alcohol abuse    Anemia    AVM (arteriovenous malformation) of colon    Cardiac arrest ([place]) 05/22/2018   Vfib   CHF (congestive heart failure) (HCC)    Cocaine abuse (HCC)    COPD (chronic obstructive pulmonary disease) (HCC)    Coronary artery disease    a. hx of POBA in [place]. b. out-of-hospital cardiac arrest 04/2018 (vfib/STEMI) s/p DES to prox LAD (complex admission with GIB, renal failure requiring HD, encephalopathy). c. STEMI 11/2018 after pt stopped Plavix - s/p DES to LAD.   Duodenitis    GI bleed    History of blood transfusion    Hyperlipidemia    Hypokalemia  Hypomagnesemia    Iron deficiency anemia    Ischemic cardiomyopathy    a. EF <25% at time of cath/arrest 04/2018, improved to 40-45% by f/u echo 06/09/18.   Myasthenia gravis ([place])    Myocardial infarction ([place]) 05/22/2018   STEMI   PAD (peripheral artery disease) ([place])    a. femoral bypass   Shock liver    a. during 7-05/2018 adm for  cardiac arrest and again in 11/2018.   Thrombocytopenia ([place]) 04/2018   Tobacco abuse    Ventricular fibrillation [facility])     Past Surgical History:  Procedure Laterality Date   ATHERECTOMY     BIOPSY  07/16/2018   Procedure: BIOPSY;  Surgeon: Gatha Mayer, MD;  Location: [place];  Service: Endoscopy;;   CARDIAC CATHETERIZATION  05/22/2018   stent   COLONOSCOPY WITH PROPOFOL N/A 07/16/2018   Procedure: COLONOSCOPY WITH PROPOFOL;  Surgeon: Gatha Mayer, MD;  Location: [place];  Service: Endoscopy;  Laterality: N/A;   CORONARY ANGIOPLASTY     CORONARY STENT INTERVENTION N/A 05/22/2018   Procedure: CORONARY STENT INTERVENTION;  Surgeon: Lorretta Harp, MD;  Location: [place] [facility];  Service: Cardiovascular;  Laterality: N/A;   CORONARY/GRAFT ACUTE MI REVASCULARIZATION N/A 12/16/2018   Procedure: CORONARY/GRAFT ACUTE MI REVASCULARIZATION;  Surgeon: Troy Sine, MD;  Location: [place] [facility];  Service: Cardiovascular;  Laterality: N/A;   ENTEROSCOPY N/A 01/22/2019   Procedure: ENTEROSCOPY;  Surgeon: Thornton Park, MD;  Location: [place];  Service: Gastroenterology;  Laterality: N/A;   ESOPHAGOGASTRODUODENOSCOPY (EGD) WITH PROPOFOL N/A 06/10/2018   Procedure: ESOPHAGOGASTRODUODENOSCOPY (EGD) WITH PROPOFOL;  Surgeon: Ladene Artist, MD;  Location: [facility] ENDOSCOPY;  Service: Endoscopy;  Laterality: N/A;   ESOPHAGOGASTRODUODENOSCOPY (EGD) WITH PROPOFOL N/A 07/16/2018   Procedure: ESOPHAGOGASTRODUODENOSCOPY (EGD) WITH PROPOFOL;  Surgeon: Gatha Mayer, MD;  Location: [place];  Service: Endoscopy;  Laterality: N/A;   FEMORAL ARTERY - POPLITEAL ARTERY BYPASS GRAFT     GIVENS CAPSULE STUDY N/A 01/20/2019   Procedure: GIVENS CAPSULE STUDY;  Surgeon: Gatha Mayer, MD;  Location: [place];  Service: Endoscopy;  Laterality: N/A;   HOT HEMOSTASIS N/A 07/16/2018   Procedure: HOT HEMOSTASIS (ARGON PLASMA COAGULATION/BICAP);  Surgeon: Gatha Mayer, MD;  Location:  [facility] ENDOSCOPY;  Service: Endoscopy;  Laterality: N/A;   HOT HEMOSTASIS N/A 01/22/2019   Procedure: HOT HEMOSTASIS (ARGON PLASMA COAGULATION/BICAP);  Surgeon: Thornton Park, MD;  Location: [place];  Service: Gastroenterology;  Laterality: N/A;   IABP INSERTION N/A 12/16/2018   Procedure: IABP Insertion;  Surgeon: Troy Sine, MD;  Location: [place] [facility];  Service: Cardiovascular;  Laterality: N/A;   IR FLUORO GUIDE CV LINE RIGHT  05/31/2018   IR REMOVAL TUN CV CATH W/O FL  06/21/2018   IR US GUIDE VASC ACCESS RIGHT  05/31/2018   LEFT HEART CATH AND CORONARY ANGIOGRAPHY N/A 05/22/2018   Procedure: LEFT HEART CATH AND CORONARY ANGIOGRAPHY;  Surgeon: Lorretta Harp, MD;  Location: [place] [facility];  Service: Cardiovascular;  Laterality: N/A;   LEFT HEART CATH AND CORONARY ANGIOGRAPHY N/A 12/16/2018   Procedure: LEFT HEART CATH AND CORONARY ANGIOGRAPHY;  Surgeon: Troy Sine, MD;  Location: [place] [facility];  Service: Cardiovascular;  Laterality: N/A;   RIGHT/LEFT HEART CATH AND CORONARY ANGIOGRAPHY N/A 02/27/2021   Procedure: RIGHT/LEFT HEART CATH AND CORONARY ANGIOGRAPHY;  Surgeon: Troy Sine, MD;  Location: [place] [facility];  Service: Cardiovascular;  Laterality: N/A;   TEE WITHOUT CARDIOVERSION  N/A 16-Mar-2021   Procedure: TRANSESOPHAGEAL ECHOCARDIOGRAM (TEE);  Surgeon: Werner Lean, MD;  Location: [facility] ENDOSCOPY;  Service: Cardiovascular;  Laterality: N/A;    Current Medications: Current Meds  Medication Sig   atorvastatin (LIPITOR) 40 MG tablet Take 1 tablet (40 mg total) by mouth daily.   azithromycin (ZITHROMAX) 250 MG tablet Take 250 mg by mouth as directed.   carvedilol (COREG) 6.25 MG tablet Take 1 tablet (6.25 mg total) by mouth 2 (two) times daily with a meal.   clopidogrel (PLAVIX) 75 MG tablet Take 1 tablet (75 mg total) by mouth daily.   empagliflozin (JARDIANCE) 10 MG TABS tablet Take 1 tablet (10 mg total) by mouth daily before breakfast.    nitroGLYCERIN (NITROSTAT) 0.4 MG SL tablet Place 1 tablet (0.4 mg total) under the tongue every 5 (five) minutes as needed for chest pain (max 3 doses).   pantoprazole (PROTONIX) 40 MG tablet Take 1 tablet (40 mg total) by mouth daily.   sacubitril-valsartan (ENTRESTO) 24-26 MG Take 1 tablet by mouth 2 (two) times daily.   spironolactone (ALDACTONE) 25 MG tablet TAKE 1 TABLET (25 MG TOTAL) BY MOUTH DAILY.   torsemide (DEMADEX) 20 MG tablet Take 1 tablet (20 mg total) by mouth daily.     Allergies:   Patient has no known allergies.   Social History   Socioeconomic History   Marital status: Divorced    Spouse name: Not on file   Number of children: Not on file   Years of education: Not on file   Highest education level: Not on file  Occupational History    Employer: [employer]  Tobacco Use   Smoking status: Every Day    Packs/day: 0.50    Types: Cigarettes   Smokeless tobacco: Current  Vaping Use   Vaping Use: Never used  Substance and Sexual Activity   Alcohol use: Yes    Comment: none in past several weeks, but normally about 5 beers a week, endorses binge drinking at celebrations   Drug use: Yes    Types: Cocaine, Marijuana, Methamphetamines, Benzodiazepines    Comment: vaguely discusses useage, but quickly changes subject   Sexual activity: Yes    Partners: Female  Other Topics Concern   Not on file  Social History Narrative   Divorced   Employed at [employer] abuse   Social Determinants of Health   Financial Resource Strain: Not on file  Food Insecurity: Not on file  Transportation Needs: Not on file  Physical Activity: Not on file  Stress: Not on file  Social Connections: Not on file     Family History: The patient's family history includes Alzheimer's disease in his father.  ROS:   Please see the history of present illness.    All other systems reviewed and are negative.  EKGs/Labs/Other Studies Reviewed:     The following studies were reviewed today: TEE 03/16/21: IMPRESSIONS     1. [place] III Severe central mitral regurgitation in the setting of  leaflet restriction. ERO 0.65.[place] Kitchen The mitral valve is abnormal. Severe  mitral valve regurgitation. No evidence of mitral stenosis. The mean  mitral valve gradient is 2.0 mmHg.   2. Left ventricular ejection fraction, by estimation, is 25 to 30%. The  left ventricle has severely decreased function. The left ventricle  demonstrates global hypokinesis.   3. Right ventricular systolic function is mildly reduced. The right  ventricular size is moderately enlarged.   4. Left atrial size  was severely dilated. Unable to visualize LAA- study  was stopped early in the setting of respiratory depression.   5. Right atrial size was mild to moderately dilated.   6. Tricuspid valve regurgitation is severe.   7. The aortic valve was not well visualized. Aortic valve regurgitation  is not visualized. Aortic valve mean gradient measures 2.0 mmHg. Aortic  valve Vmax measures 1.05 m/s.   8. PFO Suspected.   Conclusion(s)/Recommendation(s): Limited study in the setting of  respiratory compromise. If plans for edge-to-edge repair; would repeat  study when medically optimized.   EKG:  EKG is not ordered today.   Recent Labs: 02/13/2021: ALT 14; Magnesium 2.1 02/27/2021: Hemoglobin 9.2; Hemoglobin 8.8; Platelets 227 04/04/2021: B Natriuretic Peptide 285.9 04/15/2021: BUN 23; Creatinine, Ser 1.29; Potassium 4.3; Sodium 139  Recent Lipid Panel    Component Value Date/Time   CHOL 124 02/13/2021 1637   TRIG 42 02/13/2021 1637   HDL 36 (L) 02/13/2021 1637   CHOLHDL 3.4 02/13/2021 1637   CHOLHDL 3.6 12/17/2018 0237   VLDL 14 12/17/2018 0237   LDLCALC 78 02/13/2021 1637     Risk Assessment/Calculations:     STS Risk Score Isolated MV Repair: Risk of Mortality: 1.386% Renal Failure: 1.631% Permanent Stroke: 0.412% Prolonged Ventilation: 8.433% DSW  Infection: 0.046% Reoperation: 4.169% Morbidity or Mortality: 13.085% Short Length of Stay: 33.456% Long Length of Stay: 5.101%  Isolated mitral valve replacement: Risk of Mortality: 2.071% Renal Failure: 3.483% Permanent Stroke: 0.758% Prolonged Ventilation: 10.377% DSW Infection: 0.088% Reoperation: 4.452% Morbidity or Mortality: 15.670% Short Length of Stay: 22.998% Long Length of Stay: 8.152%      Physical Exam:    VS:  BP 96/68   Pulse 60   Ht 5\' 9"  (1.753 m)   Wt 160 lb 6.4 oz (72.8 kg)   BMI 23.69 kg/m     Wt Readings from Last 3 Encounters:  05/16/21 160 lb 6.4 oz (72.8 kg)  04/04/21 161 lb 12.8 oz (73.4 kg)  03/19/21 157 lb 3.2 oz (71.3 kg)     GEN:  Well nourished, well developed in no acute distress HEENT: Normal NECK: No JVD; No carotid bruits LYMPHATICS: No lymphadenopathy CARDIAC: RRR, 2/6 systolic murmur at the apex/axilla RESPIRATORY:  Clear to auscultation without rales, wheezing or rhonchi  ABDOMEN: Soft, non-tender, non-distended MUSCULOSKELETAL:  No edema; No deformity  SKIN: Warm and dry NEUROLOGIC:  Alert and oriented x 3 PSYCHIATRIC:  Normal affect   ASSESSMENT:    1. Severe mitral regurgitation    PLAN:    In order of problems listed above:  55 year old gentleman with chronic ischemic cardiomyopathy, chronic systolic heart failure New York Heart Association functional class IIIb, and severe nonrheumatic mitral regurgitation.  The patient is on optimal medical therapy as outlined above.  He has been evaluated by a multidisciplinary team of specialists, including formal advanced heart failure consultation.  He is felt to be an appropriate candidate for transcatheter edge-to-edge mitral valve repair based on available clinical trial data ([place]).  We discussed the transcatheter edge-to-edge mitral valve repair procedure and reviewed procedural indications, potential risks, and alternatives.  Specific risks include vascular  injury, bleeding, infection, cardiac injury with tamponade, need for emergent pericardiocentesis, need for emergent cardiac surgery, stroke, myocardial infarction, device embolization, mitral valve injury, cardiac arrhythmia, and death.  The patient understands these serious risks occur at a frequency of approximately 1%.  He provides full informed consent for the procedure.  He is given specific instructions regarding his medical therapy  with respect to periprocedural management.  We discussed typical recovery from transcatheter edge-to-edge mitral valve repair involving 1 night in the hospital.  All of his questions were answered to the best of my ability.  The patient is scheduled for surgery May 23, 2021.  Medication Adjustments/Labs and Tests Ordered: Current medicines are reviewed at length with the patient today.  Concerns regarding medicines are outlined above.  No orders of the defined types were placed in this encounter.  No orders of the defined types were placed in this encounter.   Patient Instructions  See letter provided today.   Signed, Sherren Mocha, MD  05/17/2021 7:38 AM    [place]

## 2021-05-16 NOTE — Patient Instructions (Signed)
See letter provided today.

## 2021-05-17 ENCOUNTER — Encounter: Payer: Self-pay | Admitting: Cardiovascular Disease

## 2021-05-20 NOTE — Progress Notes (Signed)
Surgical Instructions    Your procedure is scheduled on Thursday, July 28th, 2022.   Report to [facility] Main Entrance "A" at 07:45 A.M., then check in with the Admitting office.  Call this number if you have problems the morning of surgery:  [phone]   If you have any questions prior to your surgery date call [phone]: Open Monday-Friday 8am-4pm    Remember:  Do not eat or drink after midnight the night before your surgery    Take these medicines the morning of surgery with A SIP OF WATER:  atorvastatin (LIPITOR) azithromycin (ZITHROMAX)  carvedilol (COREG) pantoprazole (PROTONIX)   If needed:  loratadine (CLARITIN) nitroGLYCERIN (NITROSTAT)  Follow your surgeon's instructions on when to stop Plavix.  If no instructions were given by your surgeon then you will need to call the office to get those instructions.     As of today, STOP taking any Aspirin (unless otherwise instructed by your surgeon) Aleve, Naproxen, Ibuprofen, Motrin, Advil, Goody's, BC's, all herbal medications, fish oil, and all vitamins.  On the morning of surgery DO NOT TAKE Jardiance, Entresto, Furosemide and Torsemide.  HOW TO MANAGE YOUR DIABETES BEFORE AND AFTER SURGERY  Why is it important to control my blood sugar before and after surgery? Improving blood sugar levels before and after surgery helps healing and can limit problems. A way of improving blood sugar control is eating a healthy diet by:  Eating less sugar and carbohydrates  Increasing activity/exercise  Talking with your doctor about reaching your blood sugar goals High blood sugars (greater than 180 mg/dL) can raise your risk of infections and slow your recovery, so you will need to focus on controlling your diabetes during the weeks before surgery. Make sure that the doctor who takes care of your diabetes knows about your planned surgery including the date and location.  How do I manage my blood sugar before surgery? Check  your blood sugar at least 4 times a day, starting 2 days before surgery, to make sure that the level is not too high or low.  Check your blood sugar the morning of your surgery when you wake up and every 2 hours until you get to the Short Stay unit.  If your blood sugar is less than 70 mg/dL, you will need to treat for low blood sugar: Do not take insulin. Treat a low blood sugar (less than 70 mg/dL) with  cup of clear juice (cranberry or apple), 4 glucose tablets, OR glucose gel. Recheck blood sugar in 15 minutes after treatment (to make sure it is greater than 70 mg/dL). If your blood sugar is not greater than 70 mg/dL on recheck, call [phone] for further instructions. Report your blood sugar to the short stay nurse when you get to Short Stay.  If you are admitted to the hospital after surgery: Your blood sugar will be checked by the staff and you will probably be given insulin after surgery (instead of oral diabetes medicines) to make sure you have good blood sugar levels. The goal for blood sugar control after surgery is 80-180 mg/dL.           Do not wear jewelry  Do not wear lotions, powders, colognes, or deodorant.             Men may shave face and neck. Do not bring valuables to the hospital. DO Not wear nail polish, gel polish, artificial nails, or any other type of covering on natural nails including finger and toenails. If  patients have artificial nails, gel coating, etc. that need to be removed by a nail salon please have this removed prior to surgery or surgery may need to be canceled/delayed if the surgeon/ anesthesia feels like the patient is unable to be adequately monitored.             [place] is not responsible for any belongings or valuables.  Do NOT Smoke (Tobacco/Vaping) or drink Alcohol 24 hours prior to your procedure If you use a CPAP at night, you may bring all equipment for your overnight stay.   Contacts, glasses, dentures or bridgework may not be worn  into surgery, please bring cases for these belongings   For patients admitted to the hospital, discharge time will be determined by your treatment team.   Patients discharged the day of surgery will not be allowed to drive home, and someone needs to stay with them for 24 hours.  ONLY 1 SUPPORT PERSON MAY BE PRESENT WHILE YOU ARE IN SURGERY. IF YOU ARE TO BE ADMITTED ONCE YOU ARE IN YOUR ROOM YOU WILL BE ALLOWED TWO (2) VISITORS.  Minor children may have two parents present. Special consideration for safety and communication needs will be reviewed on a case by case basis.  Special instructions:    Oral Hygiene is also important to reduce your risk of infection.  Remember - BRUSH YOUR TEETH THE MORNING OF SURGERY WITH YOUR REGULAR TOOTHPASTE   [place]- Preparing For Surgery  Before surgery, you can play an important role. Because skin is not sterile, your skin needs to be as free of germs as possible. You can reduce the number of germs on your skin by washing with CHG (chlorahexidine gluconate) Soap before surgery.  CHG is an antiseptic cleaner which kills germs and bonds with the skin to continue killing germs even after washing.     Please do not use if you have an allergy to CHG or antibacterial soaps. If your skin becomes reddened/irritated stop using the CHG.  Do not shave (including legs and underarms) for at least 48 hours prior to first CHG shower. It is OK to shave your face.  Please follow these instructions carefully.     Shower the NIGHT BEFORE SURGERY and the MORNING OF SURGERY with CHG Soap.   If you chose to wash your hair, wash your hair first as usual with your normal shampoo. After you shampoo, rinse your hair and body thoroughly to remove the shampoo.  Then [employer] and genitals (private parts) with your normal soap and rinse thoroughly to remove soap.  After that Use CHG Soap as you would any other liquid soap. You can apply CHG directly to the skin and wash gently  with a scrungie or a clean washcloth.   Apply the CHG Soap to your body ONLY FROM THE NECK DOWN.  Do not use on open wounds or open sores. Avoid contact with your eyes, ears, mouth and genitals (private parts). Wash Face and genitals (private parts)  with your normal soap.   Wash thoroughly, paying special attention to the area where your surgery will be performed.  Thoroughly rinse your body with warm water from the neck down.  DO NOT shower/wash with your normal soap after using and rinsing off the CHG Soap.  Pat yourself dry with a CLEAN TOWEL.  Wear CLEAN PAJAMAS to bed the night before surgery  Place CLEAN SHEETS on your bed the night before your surgery  DO NOT SLEEP WITH PETS.  Day of Surgery:  Take a shower with CHG soap. Wear Clean/Comfortable clothing the morning of surgery Do not apply any deodorants/lotions.   Remember to brush your teeth WITH YOUR REGULAR TOOTHPASTE.   Please read over the following fact sheets that you were given.

## 2021-05-21 ENCOUNTER — Ambulatory Visit: Payer: No Typology Code available for payment source | Attending: Cardiovascular Disease | Admitting: Physical Therapy

## 2021-05-21 ENCOUNTER — Other Ambulatory Visit: Payer: Self-pay

## 2021-05-21 ENCOUNTER — Encounter (HOSPITAL_COMMUNITY)
Admission: RE | Admit: 2021-05-21 | Discharge: 2021-05-21 | Disposition: A | Discharge status: Home / Self Care | Payer: No Typology Code available for payment source | Source: Ambulatory Visit | Attending: Cardiovascular Disease | Admitting: Cardiovascular Disease

## 2021-05-21 ENCOUNTER — Encounter: Payer: Self-pay | Admitting: Physical Therapy

## 2021-05-21 ENCOUNTER — Encounter (HOSPITAL_COMMUNITY): Payer: Self-pay

## 2021-05-21 ENCOUNTER — Ambulatory Visit (HOSPITAL_COMMUNITY)
Admission: RE | Admit: 2021-05-21 | Discharge: 2021-05-21 | Disposition: A | Discharge status: Home / Self Care | Payer: No Typology Code available for payment source | Source: Ambulatory Visit | Attending: Cardiovascular Disease | Admitting: Cardiovascular Disease

## 2021-05-21 DIAGNOSIS — I340 Nonrheumatic mitral (valve) insufficiency: Secondary | ICD-10-CM | POA: Diagnosis not present

## 2021-05-21 DIAGNOSIS — R2689 Other abnormalities of gait and mobility: Secondary | ICD-10-CM | POA: Insufficient documentation

## 2021-05-21 DIAGNOSIS — Z01818 Encounter for other preprocedural examination: Secondary | ICD-10-CM | POA: Insufficient documentation

## 2021-05-21 DIAGNOSIS — I5022 Chronic systolic (congestive) heart failure: Secondary | ICD-10-CM | POA: Diagnosis not present

## 2021-05-21 DIAGNOSIS — Z006 Encounter for examination for normal comparison and control in clinical research program: Secondary | ICD-10-CM | POA: Diagnosis not present

## 2021-05-21 DIAGNOSIS — Z20822 Contact with and (suspected) exposure to covid-19: Secondary | ICD-10-CM | POA: Insufficient documentation

## 2021-05-21 HISTORY — DX: Gastro-esophageal reflux disease without esophagitis: K21.9

## 2021-05-21 HISTORY — DX: Sleep apnea, unspecified: G47.30

## 2021-05-21 LAB — COMPREHENSIVE METABOLIC PANEL
ALT: 17 U/L (ref 0–44)
AST: 23 U/L (ref 15–41)
Albumin: 4 g/dL (ref 3.5–5.0)
Alkaline Phosphatase: 73 U/L (ref 38–126)
Anion gap: 7 (ref 5–15)
BUN: 18 mg/dL (ref 6–20)
CO2: 23 mmol/L (ref 22–32)
Calcium: 9.2 mg/dL (ref 8.9–10.3)
Chloride: 107 mmol/L (ref 98–111)
Creatinine, Ser: 1.19 mg/dL (ref 0.61–1.24)
GFR, Estimated: >60 mL/min (ref >60)
Glucose, Bld: 96 mg/dL (ref 70–99)
Potassium: 4.2 mmol/L (ref 3.5–5.1)
Sodium: 137 mmol/L (ref 135–145)
Total Bilirubin: 0.3 mg/dL (ref 0.3–1.2)
Total Protein: 7.4 g/dL (ref 6.5–8.1)

## 2021-05-21 LAB — CBC
HCT: 36.3 % — ABNORMAL LOW (ref 39.0–52.0)
Hemoglobin: 10.7 g/dL — ABNORMAL LOW (ref 13.0–17.0)
MCH: 21.6 pg — ABNORMAL LOW (ref 26.0–34.0)
MCHC: 29.5 g/dL — ABNORMAL LOW (ref 30.0–36.0)
MCV: 73.2 fL — ABNORMAL LOW (ref 80.0–100.0)
Platelets: 313 10*3/uL (ref 150–400)
RBC: 4.96 MIL/uL (ref 4.22–5.81)
RDW: 25.1 % — ABNORMAL HIGH (ref 11.5–15.5)
WBC: 6.9 10*3/uL (ref 4.0–10.5)
nRBC: 0 % (ref 0.0–0.2)

## 2021-05-21 LAB — TYPE AND SCREEN
ABO/RH(D): O POS
Antibody Screen: NEGATIVE

## 2021-05-21 LAB — BLOOD GAS, ARTERIAL
Acid-base deficit: 0.7 mmol/L (ref 0.0–2.0)
Bicarbonate: 22.8 mmol/L (ref 20.0–28.0)
FIO2: 21
O2 Saturation: 98.2 %
Patient temperature: 37
pCO2 arterial: 34 mmHg (ref 32.0–48.0)
pH, Arterial: 7.443 (ref 7.350–7.450)
pO2, Arterial: 104 mmHg (ref 83.0–108.0)

## 2021-05-21 LAB — URINALYSIS, ROUTINE W REFLEX MICROSCOPIC
Bacteria, UA: NONE SEEN
Bilirubin Urine: NEGATIVE
Glucose, UA: >=500 mg/dL — AB
Hgb urine dipstick: NEGATIVE
Ketones, ur: NEGATIVE mg/dL
Leukocytes,Ua: NEGATIVE
Nitrite: NEGATIVE
Protein, ur: NEGATIVE mg/dL
Specific Gravity, Urine: 1.003 — ABNORMAL LOW (ref 1.005–1.030)
pH: 7 (ref 5.0–8.0)

## 2021-05-21 LAB — SURGICAL PCR SCREEN
MRSA, PCR: NEGATIVE
Staphylococcus aureus: POSITIVE — AB

## 2021-05-21 LAB — PROTIME-INR
INR: 0.9 (ref 0.8–1.2)
Prothrombin Time: 12.5 seconds (ref 11.4–15.2)

## 2021-05-21 LAB — SARS CORONAVIRUS 2 (TAT 6-24 HRS): SARS Coronavirus 2: NEGATIVE

## 2021-05-21 NOTE — Progress Notes (Signed)
PCP - Jamesetta Geralds, MD Cardiologist - Shelva Majestic, MD  PPM/ICD - denies Device Orders - N/A Rep Notified - N/A  Chest x-ray - 05/21/2021 EKG - 05/21/2021 Stress Test - more than 10 years ago ECHO - 02/27/2021 Cardiac Cath - 02/27/2021  Sleep Study - yes CPAP - not using   Fasting Blood Sugar - N/A  Blood Thinner Instructions: Plavix - patient continue taking Plavix (per Theodosia Quay, RN)  Aspirin Instructions: Patient was instructed: As of today, STOP taking any Aspirin (unless otherwise instructed by your surgeon) Aleve, Naproxen, Ibuprofen, Motrin, Advil, Goody's, BC's, all herbal medications, fish oil, and all vitamins  ERAS Protcol - No  PRE-SURGERY Ensure or G2- N/A  COVID TEST- 05/21/2021   Anesthesia review: yes; cardiac history  Patient denies shortness of breath, fever, cough and chest pain at PAT appointment   All instructions explained to the patient, with a verbal understanding of the material. Patient agrees to go over the instructions while at home for a better understanding. Patient also instructed to self quarantine after being tested for COVID-19. The opportunity to ask questions was provided.

## 2021-05-21 NOTE — Therapy (Addendum)
[place], [place], [address] Phone: [phone]   Fax:  [phone]  Physical Therapy Evaluation  Patient Details  Name: Howard Thompson MRN: [id number] Date of Birth: Jan 30, 1966 Referring Provider (PT): Sherren Mocha, MD  Encounter Date: 05/21/2021   PT End of Session - 05/21/21 1450     Visit Number 1    Number of Visits 1    PT Start Time 0210    PT Stop Time 0245    PT Time Calculation (min) 35 min    Activity Tolerance Patient tolerated treatment well    Behavior During Therapy [facility] for tasks assessed/performed             Past Medical History:  Diagnosis Date   Acute renal failure ([place])    a. a. during 7-05/2018 adm for cardiac arrest, requiring HD temporarily.   Alcohol abuse    Anemia    AVM (arteriovenous malformation) of colon    Cardiac arrest ([place]) 05/22/2018   Vfib   CHF (congestive heart failure) (HCC)    Cocaine abuse (HCC)    COPD (chronic obstructive pulmonary disease) (HCC)    Coronary artery disease    a. hx of POBA in [place]. b. out-of-hospital cardiac arrest 04/2018 (vfib/STEMI) s/p DES to prox LAD (complex admission with GIB, renal failure requiring HD, encephalopathy). c. STEMI 11/2018 after pt stopped Plavix - s/p DES to LAD.   Duodenitis    GERD (gastroesophageal reflux disease)    GI bleed    History of blood transfusion    Hyperlipidemia    Hypokalemia    Hypomagnesemia    Iron deficiency anemia    Ischemic cardiomyopathy    a. EF <25% at time of cath/arrest 04/2018, improved to 40-45% by f/u echo 06/09/18.   Myasthenia gravis ([place])    Myocardial infarction ([place]) 05/22/2018   STEMI   PAD (peripheral artery disease) ([place])    a. femoral bypass   Shock liver    a. during 7-05/2018 adm for cardiac arrest and again in 11/2018.   Sleep apnea    Thrombocytopenia ([place]) 04/2018   Tobacco abuse    Ventricular fibrillation [facility])     Past Surgical History:  Procedure  Laterality Date   ATHERECTOMY     BIOPSY  07/16/2018   Procedure: BIOPSY;  Surgeon: Gatha Mayer, MD;  Location: [facility] ENDOSCOPY;  Service: Endoscopy;;   CARDIAC CATHETERIZATION  05/22/2018   stent   COLONOSCOPY WITH PROPOFOL N/A 07/16/2018   Procedure: COLONOSCOPY WITH PROPOFOL;  Surgeon: Gatha Mayer, MD;  Location: [place];  Service: Endoscopy;  Laterality: N/A;   CORONARY ANGIOPLASTY     CORONARY STENT INTERVENTION N/A 05/22/2018   Procedure: CORONARY STENT INTERVENTION;  Surgeon: Lorretta Harp, MD;  Location: [place] CV LAB;  Service: Cardiovascular;  Laterality: N/A;   CORONARY/GRAFT ACUTE MI REVASCULARIZATION N/A 12/16/2018   Procedure: CORONARY/GRAFT ACUTE MI REVASCULARIZATION;  Surgeon: Troy Sine, MD;  Location: [place] CV LAB;  Service: Cardiovascular;  Laterality: N/A;   ENTEROSCOPY N/A 01/22/2019   Procedure: ENTEROSCOPY;  Surgeon: Thornton Park, MD;  Location: [place];  Service: Gastroenterology;  Laterality: N/A;   ESOPHAGOGASTRODUODENOSCOPY (EGD) WITH PROPOFOL N/A 06/10/2018   Procedure: ESOPHAGOGASTRODUODENOSCOPY (EGD) WITH PROPOFOL;  Surgeon: Ladene Artist, MD;  Location: [facility] ENDOSCOPY;  Service: Endoscopy;  Laterality: N/A;   ESOPHAGOGASTRODUODENOSCOPY (EGD) WITH PROPOFOL N/A 07/16/2018   Procedure: ESOPHAGOGASTRODUODENOSCOPY (EGD) WITH PROPOFOL;  Surgeon: Gatha Mayer, MD;  Location: [facility]  ENDOSCOPY;  Service: Endoscopy;  Laterality: N/A;   FEMORAL ARTERY - POPLITEAL ARTERY BYPASS GRAFT     GIVENS CAPSULE STUDY N/A 01/20/2019   Procedure: GIVENS CAPSULE STUDY;  Surgeon: Gatha Mayer, MD;  Location: [place];  Service: Endoscopy;  Laterality: N/A;   HOT HEMOSTASIS N/A 07/16/2018   Procedure: HOT HEMOSTASIS (ARGON PLASMA COAGULATION/BICAP);  Surgeon: Gatha Mayer, MD;  Location: [facility] ENDOSCOPY;  Service: Endoscopy;  Laterality: N/A;   HOT HEMOSTASIS N/A 01/22/2019   Procedure: HOT HEMOSTASIS (ARGON PLASMA COAGULATION/BICAP);  Surgeon:  Thornton Park, MD;  Location: [place];  Service: Gastroenterology;  Laterality: N/A;   IABP INSERTION N/A 12/16/2018   Procedure: IABP Insertion;  Surgeon: Troy Sine, MD;  Location: [place] CV LAB;  Service: Cardiovascular;  Laterality: N/A;   IR FLUORO GUIDE CV LINE RIGHT  05/31/2018   IR REMOVAL TUN CV CATH W/O FL  06/21/2018   IR US GUIDE VASC ACCESS RIGHT  05/31/2018   LEFT HEART CATH AND CORONARY ANGIOGRAPHY N/A 05/22/2018   Procedure: LEFT HEART CATH AND CORONARY ANGIOGRAPHY;  Surgeon: Lorretta Harp, MD;  Location: [place] CV LAB;  Service: Cardiovascular;  Laterality: N/A;   LEFT HEART CATH AND CORONARY ANGIOGRAPHY N/A 12/16/2018   Procedure: LEFT HEART CATH AND CORONARY ANGIOGRAPHY;  Surgeon: Troy Sine, MD;  Location: [place] CV LAB;  Service: Cardiovascular;  Laterality: N/A;   RIGHT/LEFT HEART CATH AND CORONARY ANGIOGRAPHY N/A 02/27/2021   Procedure: RIGHT/LEFT HEART CATH AND CORONARY ANGIOGRAPHY;  Surgeon: Troy Sine, MD;  Location: [place] CV LAB;  Service: Cardiovascular;  Laterality: N/A;   TEE WITHOUT CARDIOVERSION N/A 02/27/2021   Procedure: TRANSESOPHAGEAL ECHOCARDIOGRAM (TEE);  Surgeon: Werner Lean, MD;  Location: [facility] ENDOSCOPY;  Service: Cardiovascular;  Laterality: N/A;    There were no vitals filed for this visit.     Subjective Assessment - 05/21/21 1449     Subjective Howard Thompson is a 55 y.o. male who presents to clinic with chief complaint of fatigue following major MI about 2 years ago.  History of condition: fatigue following MI about 2 years.  Pain location: denies pain. Functional limitations/goals: ADLs, recreation (golf), playing with 89 year old son.  Home environment: lives alone with possible help from neighbor, 3 steps to enter, 4 story house Management consultant).  Assistive device: none   Referring provider: Sherren Mocha, MD             [facility] PT Assessment - 05/21/21 0001       Assessment   Medical Diagnosis  Mitral Regurgitation    Referring Provider (PT) Sherren Mocha, MD    Onset Date/Surgical Date 05/22/19    Hand Dominance Right    Next MD Visit 7/28      Precautions   Precaution Comments Hx of MI, MG      Restrictions   Other Position/Activity Restrictions none             [facility] Pre-Surgical Assessment:  AROM:   UE:  WFL LE:  WFL  Strength:  UE:  WFL LE:  WFL  Resting Vitals:  BP:  117/67 HR:  86 BPM 02 Sat:  98%  RPE:  7 Modified Borg scale:  .5  Grip strength:  R: 39.5 kg L: 40 kg  4 stage balance: >10'' in tandem stance  5x STS: 10  5 m walk test (3 trials) and average:    Trial 1:  3 sec  Trial 2:  3 sec  Trial 3:  3 sec  Average:  3 sec  6 min walk test:  1480 feet  Vitals following 6 min walk test:  BP:  125/69 HR:  103 BPM 02 Sat:  100%  RPE:  10 Modified Borg scale:  2  Fraility index:    % diability 21.11%  % of norm 78.89%    Clinical Impression Statement:   Pt is a 55 y.o. yo male presenting to OP PT for evaluation prior to possible Mitraclip surgery due to severe mitral regurgitation. Pt reports onset of fatige approximately 24 months ago. Symptoms are limiting exercises and ADLs. Pt presents with nomral ROM and strength, good balance and is at low fall risk (according to 4 stage balance test), good walking speed and poor aerobic endurance per 6 minute walk test. Pt ambulated 1480 feet in 6 min.  Fatigue and SOB increased somewhat with 6 minute walk test. Based on the Short Physical Performance Battery, patient has a frailty rating of 12/12 with </= 5/12 considered frail.    Objective measurements completed on examination: See above findings.                            Plan - 05/21/21 1450     Clinical Impression Statement see assessment in note    Stability/Clinical Decision Making Stable/Uncomplicated    Clinical Decision Making Low    PT Frequency One time visit    PT Next Visit Plan  pre-tavr evaluation              Patient will benefit from skilled therapeutic intervention in order to improve the following deficits and impairments:     Visit Diagnosis: Other abnormalities of gait and mobility - Plan: PT plan of care cert/re-cert     Problem List Patient Active Problem List   Diagnosis Date Noted   Severe mitral regurgitation    Acute decompensated heart failure ([place]) 08/23/2020   Abnormal LFTs 08/23/2020   Acute on chronic systolic (congestive) heart failure ([place]) 03/14/2019   Alcohol abuse 03/14/2019   Essential hypertension 03/14/2019   Acute combined systolic and diastolic heart failure (HCC)    AVM (arteriovenous malformation) of small bowel, acquired with hemorrhage    GI bleeding 01/19/2019   Iron deficiency anemia 12/20/2018   CAD in native artery 12/20/2018   Acute blood loss anemia    Antiplatelet or antithrombotic long-term use    Anemia 07/14/2018   Protein-calorie malnutrition, severe 06/11/2018   Anemia due to chronic blood loss    Hyperlipidemia LDL goal <70    Ischemic cardiomyopathy     Shearon Balo PT, DPT 05/21/21 6:49 PM   [facility] [address] [place], [place], [address] Phone: [phone]   Fax:  [phone]  Name: Howard Thompson MRN: [id number] Date of Birth: Dec 27, 1965

## 2021-05-21 NOTE — Progress Notes (Signed)
Abnormal UA in PAT - glucose UA > 500. Updated Theodosia Quay, RN at DR. Clayton office.

## 2021-05-22 MED ORDER — TRANEXAMIC ACID (OHS) PUMP PRIME SOLUTION
2.0000 mg/kg | INTRAVENOUS | Status: DC
  Filled 2021-05-22: qty 1.51

## 2021-05-22 MED ORDER — DEXMEDETOMIDINE HCL IN NACL 400 MCG/100ML IV SOLN
0.1000 ug/kg/h | INTRAVENOUS | Status: DC
  Filled 2021-05-22 (×2): qty 100

## 2021-05-22 MED ORDER — EPINEPHRINE HCL 5 MG/250ML IV SOLN IN NS
0.0000 ug/min | INTRAVENOUS | Status: DC
  Filled 2021-05-22 (×2): qty 250

## 2021-05-22 MED ORDER — PHENYLEPHRINE HCL-NACL 20-0.9 MG/250ML-% IV SOLN
30.0000 ug/min | INTRAVENOUS | Status: DC
  Filled 2021-05-22 (×2): qty 250

## 2021-05-22 MED ORDER — MAGNESIUM SULFATE 50 % IJ SOLN
40.0000 meq | INTRAMUSCULAR | Status: DC
  Filled 2021-05-22: qty 9.85

## 2021-05-22 MED ORDER — TRANEXAMIC ACID 1000 MG/10ML IV SOLN
1.5000 mg/kg/h | INTRAVENOUS | Status: DC
  Filled 2021-05-22: qty 25

## 2021-05-22 MED ORDER — CEFAZOLIN SODIUM-DEXTROSE 2-4 GM/100ML-% IV SOLN
2.0000 g | INTRAVENOUS | Status: DC
  Filled 2021-05-22 (×2): qty 100

## 2021-05-22 MED ORDER — VANCOMYCIN HCL 1250 MG/250ML IV SOLN
1250.0000 mg | INTRAVENOUS | Status: DC
Start: 2021-05-23 — End: 2021-05-23
  Filled 2021-05-22 (×2): qty 250

## 2021-05-22 MED ORDER — INSULIN REGULAR(HUMAN) IN NACL 100-0.9 UT/100ML-% IV SOLN
INTRAVENOUS | Status: DC
  Filled 2021-05-22 (×2): qty 100

## 2021-05-22 MED ORDER — MILRINONE LACTATE IN DEXTROSE 20-5 MG/100ML-% IV SOLN
0.3000 ug/kg/min | INTRAVENOUS | Status: DC
  Filled 2021-05-22 (×2): qty 100

## 2021-05-22 MED ORDER — NITROGLYCERIN IN D5W 200-5 MCG/ML-% IV SOLN
2.0000 ug/min | INTRAVENOUS | Status: DC
  Filled 2021-05-22: qty 250

## 2021-05-22 MED ORDER — CEFAZOLIN SODIUM-DEXTROSE 2-4 GM/100ML-% IV SOLN
2.0000 g | INTRAVENOUS | Status: AC
  Administered 2021-05-23: 2 g via INTRAVENOUS
  Filled 2021-05-22 (×2): qty 100

## 2021-05-22 MED ORDER — PLASMA-LYTE A IV SOLN
INTRAVENOUS | Status: DC
  Filled 2021-05-22: qty 5

## 2021-05-22 MED ORDER — SODIUM CHLORIDE 0.9 % IV SOLN
INTRAVENOUS | Status: DC
  Filled 2021-05-22: qty 30

## 2021-05-22 MED ORDER — POTASSIUM CHLORIDE 2 MEQ/ML IV SOLN
80.0000 meq | INTRAVENOUS | Status: DC
  Filled 2021-05-22: qty 40

## 2021-05-22 MED ORDER — NOREPINEPHRINE 4 MG/250ML-% IV SOLN
0.0000 ug/min | INTRAVENOUS | Status: AC
Start: 2021-05-23 — End: 2021-05-23
  Administered 2021-05-23: 2 ug/min via INTRAVENOUS
  Filled 2021-05-22: qty 250

## 2021-05-22 MED ORDER — TRANEXAMIC ACID (OHS) BOLUS VIA INFUSION
15.0000 mg/kg | INTRAVENOUS | Status: DC
  Filled 2021-05-22: qty 1134

## 2021-05-23 ENCOUNTER — Inpatient Hospital Stay (HOSPITAL_COMMUNITY)
Admission: RE | Admit: 2021-05-23 | Discharge: 2021-05-23 | Disposition: A | Discharge status: Home / Self Care | Payer: No Typology Code available for payment source | Source: Ambulatory Visit | Attending: Cardiovascular Disease | Admitting: Cardiovascular Disease

## 2021-05-23 ENCOUNTER — Other Ambulatory Visit: Payer: Self-pay

## 2021-05-23 ENCOUNTER — Encounter (HOSPITAL_COMMUNITY)
Admission: RE | Disposition: A | Discharge status: Home / Self Care | Payer: Self-pay | Source: Home / Self Care | Attending: Cardiovascular Disease

## 2021-05-23 ENCOUNTER — Inpatient Hospital Stay (HOSPITAL_COMMUNITY): Payer: No Typology Code available for payment source | Admitting: Certified Registered Nurse Anesthetist

## 2021-05-23 ENCOUNTER — Inpatient Hospital Stay (HOSPITAL_COMMUNITY)
Admission: RE | Admit: 2021-05-23 | Discharge: 2021-05-24 | DRG: 267 | Disposition: A | Discharge status: Home / Self Care | Payer: No Typology Code available for payment source | Attending: Cardiovascular Disease | Admitting: Cardiovascular Disease

## 2021-05-23 ENCOUNTER — Encounter (HOSPITAL_COMMUNITY): Payer: Self-pay | Admitting: Cardiovascular Disease

## 2021-05-23 ENCOUNTER — Inpatient Hospital Stay (HOSPITAL_COMMUNITY): Payer: No Typology Code available for payment source | Admitting: Physician Assistant

## 2021-05-23 ENCOUNTER — Other Ambulatory Visit: Payer: Self-pay | Admitting: Physician Assistant

## 2021-05-23 ENCOUNTER — Encounter: Payer: Self-pay | Admitting: Physician Assistant

## 2021-05-23 DIAGNOSIS — I252 Old myocardial infarction: Secondary | ICD-10-CM | POA: Diagnosis not present

## 2021-05-23 DIAGNOSIS — I255 Ischemic cardiomyopathy: Secondary | ICD-10-CM | POA: Diagnosis present

## 2021-05-23 DIAGNOSIS — Z79899 Other long term (current) drug therapy: Secondary | ICD-10-CM

## 2021-05-23 DIAGNOSIS — Z820 Family history of epilepsy and other diseases of the nervous system: Secondary | ICD-10-CM | POA: Diagnosis not present

## 2021-05-23 DIAGNOSIS — F17210 Nicotine dependence, cigarettes, uncomplicated: Secondary | ICD-10-CM | POA: Diagnosis present

## 2021-05-23 DIAGNOSIS — I5022 Chronic systolic (congestive) heart failure: Secondary | ICD-10-CM | POA: Diagnosis present

## 2021-05-23 DIAGNOSIS — Z006 Encounter for examination for normal comparison and control in clinical research program: Secondary | ICD-10-CM

## 2021-05-23 DIAGNOSIS — I739 Peripheral vascular disease, unspecified: Secondary | ICD-10-CM | POA: Diagnosis present

## 2021-05-23 DIAGNOSIS — K219 Gastro-esophageal reflux disease without esophagitis: Secondary | ICD-10-CM | POA: Diagnosis present

## 2021-05-23 DIAGNOSIS — Z7902 Long term (current) use of antithrombotics/antiplatelets: Secondary | ICD-10-CM

## 2021-05-23 DIAGNOSIS — Z955 Presence of coronary angioplasty implant and graft: Secondary | ICD-10-CM | POA: Diagnosis not present

## 2021-05-23 DIAGNOSIS — G4730 Sleep apnea, unspecified: Secondary | ICD-10-CM | POA: Diagnosis present

## 2021-05-23 DIAGNOSIS — I2510 Atherosclerotic heart disease of native coronary artery without angina pectoris: Secondary | ICD-10-CM | POA: Diagnosis present

## 2021-05-23 DIAGNOSIS — I340 Nonrheumatic mitral (valve) insufficiency: Secondary | ICD-10-CM | POA: Diagnosis present

## 2021-05-23 DIAGNOSIS — Z952 Presence of prosthetic heart valve: Secondary | ICD-10-CM | POA: Diagnosis not present

## 2021-05-23 DIAGNOSIS — I110 Hypertensive heart disease with heart failure: Secondary | ICD-10-CM | POA: Diagnosis present

## 2021-05-23 DIAGNOSIS — E785 Hyperlipidemia, unspecified: Secondary | ICD-10-CM | POA: Diagnosis present

## 2021-05-23 DIAGNOSIS — Z8674 Personal history of sudden cardiac arrest: Secondary | ICD-10-CM

## 2021-05-23 DIAGNOSIS — Z20822 Contact with and (suspected) exposure to covid-19: Secondary | ICD-10-CM | POA: Diagnosis present

## 2021-05-23 DIAGNOSIS — J449 Chronic obstructive pulmonary disease, unspecified: Secondary | ICD-10-CM | POA: Diagnosis present

## 2021-05-23 DIAGNOSIS — I10 Essential (primary) hypertension: Secondary | ICD-10-CM | POA: Diagnosis present

## 2021-05-23 DIAGNOSIS — Z01818 Encounter for other preprocedural examination: Secondary | ICD-10-CM

## 2021-05-23 DIAGNOSIS — D500 Iron deficiency anemia secondary to blood loss (chronic): Secondary | ICD-10-CM | POA: Diagnosis present

## 2021-05-23 DIAGNOSIS — F1910 Other psychoactive substance abuse, uncomplicated: Secondary | ICD-10-CM | POA: Diagnosis present

## 2021-05-23 DIAGNOSIS — G7000 Myasthenia gravis without (acute) exacerbation: Secondary | ICD-10-CM | POA: Diagnosis present

## 2021-05-23 DIAGNOSIS — Z95818 Presence of other cardiac implants and grafts: Secondary | ICD-10-CM

## 2021-05-23 DIAGNOSIS — K5521 Angiodysplasia of colon with hemorrhage: Secondary | ICD-10-CM | POA: Diagnosis present

## 2021-05-23 DIAGNOSIS — E43 Unspecified severe protein-calorie malnutrition: Secondary | ICD-10-CM | POA: Diagnosis present

## 2021-05-23 DIAGNOSIS — Z9889 Other specified postprocedural states: Secondary | ICD-10-CM

## 2021-05-23 HISTORY — DX: Presence of other cardiac implants and grafts: Z95.818

## 2021-05-23 HISTORY — PX: TEE WITHOUT CARDIOVERSION: SHX5443

## 2021-05-23 HISTORY — DX: Other specified postprocedural states: Z98.890

## 2021-05-23 HISTORY — DX: Other psychoactive substance abuse, uncomplicated: F19.10

## 2021-05-23 HISTORY — PX: MITRAL VALVE REPAIR: CATH118311

## 2021-05-23 LAB — ECHO TEE
AV Mean grad: 12 mmHg
AV Peak grad: 20.4 mmHg
Ao pk vel: 2.26 m/s
MV M vel: 4.79 m/s
MV Peak grad: 91.8 mmHg
P 1/2 time: 440 msec
Radius: 0.9 cm

## 2021-05-23 LAB — POCT ACTIVATED CLOTTING TIME
Activated Clotting Time: 237 seconds
Activated Clotting Time: 126 seconds
Activated Clotting Time: 260 seconds

## 2021-05-23 SURGERY — MITRAL VALVE REPAIR
Anesthesia: General

## 2021-05-23 MED ORDER — CHLORHEXIDINE GLUCONATE 0.12 % MT SOLN
15.0000 mL | OROMUCOSAL | Status: AC
  Administered 2021-05-23: 15 mL via OROMUCOSAL

## 2021-05-23 MED ORDER — HEPARIN SODIUM (PORCINE) 1000 UNIT/ML IJ SOLN
INTRAMUSCULAR | Status: DC | PRN
  Administered 2021-05-23: 4000 [IU] via INTRAVENOUS
  Administered 2021-05-23: 10000 [IU] via INTRAVENOUS

## 2021-05-23 MED ORDER — SODIUM CHLORIDE 0.9% FLUSH
3.0000 mL | Freq: Two times a day (BID) | INTRAVENOUS | Status: DC
  Administered 2021-05-24: 3 mL via INTRAVENOUS

## 2021-05-23 MED ORDER — PHENYLEPHRINE 40 MCG/ML (10ML) SYRINGE FOR IV PUSH (FOR BLOOD PRESSURE SUPPORT)
PREFILLED_SYRINGE | INTRAVENOUS | Status: DC | PRN
  Administered 2021-05-23 (×2): 80 ug via INTRAVENOUS

## 2021-05-23 MED ORDER — PROTAMINE SULFATE 10 MG/ML IV SOLN
INTRAVENOUS | Status: DC | PRN
  Administered 2021-05-23: 40 mg via INTRAVENOUS

## 2021-05-23 MED ORDER — ASPIRIN 81 MG PO CHEW
81.0000 mg | CHEWABLE_TABLET | Freq: Every day | ORAL | Status: DC
  Administered 2021-05-23 – 2021-05-24 (×2): 81 mg via ORAL
  Filled 2021-05-23 (×2): qty 1

## 2021-05-23 MED ORDER — HEPARIN (PORCINE) IN NACL 2000-0.9 UNIT/L-% IV SOLN
INTRAVENOUS | Status: AC
  Filled 2021-05-23: qty 3000

## 2021-05-23 MED ORDER — SPIRONOLACTONE 25 MG PO TABS
25.0000 mg | ORAL_TABLET | Freq: Every day | ORAL | Status: DC
  Administered 2021-05-23 – 2021-05-24 (×2): 25 mg via ORAL
  Filled 2021-05-23 (×2): qty 1

## 2021-05-23 MED ORDER — EMPAGLIFLOZIN 10 MG PO TABS
10.0000 mg | ORAL_TABLET | Freq: Every day | ORAL | Status: DC
  Administered 2021-05-24: 10 mg via ORAL
  Filled 2021-05-23: qty 1

## 2021-05-23 MED ORDER — LACTATED RINGERS IV SOLN: INTRAVENOUS | Status: DC

## 2021-05-23 MED ORDER — LORATADINE 10 MG PO TABS: 10.0000 mg | ORAL_TABLET | Freq: Every day | ORAL | Status: DC | PRN

## 2021-05-23 MED ORDER — SODIUM CHLORIDE 0.9% FLUSH
3.0000 mL | INTRAVENOUS | Status: DC | PRN
Start: 2021-05-23 — End: 2021-05-24

## 2021-05-23 MED ORDER — ONDANSETRON HCL 4 MG/2ML IJ SOLN
INTRAMUSCULAR | Status: DC | PRN
Start: 2021-05-23 — End: 2021-05-23
  Administered 2021-05-23: 4 mg via INTRAVENOUS

## 2021-05-23 MED ORDER — CEFAZOLIN SODIUM-DEXTROSE 2-4 GM/100ML-% IV SOLN: 2.0000 g | INTRAVENOUS | Status: DC

## 2021-05-23 MED ORDER — FENTANYL CITRATE (PF) 250 MCG/5ML IJ SOLN
INTRAMUSCULAR | Status: DC | PRN
  Administered 2021-05-23: 100 ug via INTRAVENOUS

## 2021-05-23 MED ORDER — CHLORHEXIDINE GLUCONATE 4 % EX LIQD: 30.0000 mL | CUTANEOUS | Status: DC

## 2021-05-23 MED ORDER — SODIUM CHLORIDE 0.9 % IV SOLN: INTRAVENOUS | Status: DC

## 2021-05-23 MED ORDER — ACETAMINOPHEN 325 MG PO TABS: 650.0000 mg | ORAL_TABLET | ORAL | Status: DC | PRN

## 2021-05-23 MED ORDER — SODIUM CHLORIDE 0.9 % IV SOLN: 250.0000 mL | INTRAVENOUS | Status: DC | PRN

## 2021-05-23 MED ORDER — HEPARIN (PORCINE) IN NACL 1000-0.9 UT/500ML-% IV SOLN
INTRAVENOUS | Status: DC | PRN
  Administered 2021-05-23: 500 mL

## 2021-05-23 MED ORDER — ETOMIDATE 2 MG/ML IV SOLN
INTRAVENOUS | Status: DC | PRN
  Administered 2021-05-23: 20 mg via INTRAVENOUS

## 2021-05-23 MED ORDER — ONDANSETRON HCL 4 MG/2ML IJ SOLN: 4.0000 mg | Freq: Four times a day (QID) | INTRAMUSCULAR | Status: DC | PRN

## 2021-05-23 MED ORDER — ROCURONIUM BROMIDE 10 MG/ML (PF) SYRINGE
PREFILLED_SYRINGE | INTRAVENOUS | Status: DC | PRN
  Administered 2021-05-23: 20 mg via INTRAVENOUS
  Administered 2021-05-23: 50 mg via INTRAVENOUS
  Administered 2021-05-23: 10 mg via INTRAVENOUS

## 2021-05-23 MED ORDER — SUGAMMADEX SODIUM 200 MG/2ML IV SOLN
INTRAVENOUS | Status: DC | PRN
  Administered 2021-05-23 (×2): 200 mg via INTRAVENOUS

## 2021-05-23 MED ORDER — CHLORHEXIDINE GLUCONATE 4 % EX LIQD: 60.0000 mL | Freq: Once | CUTANEOUS | Status: DC

## 2021-05-23 MED ORDER — LABETALOL HCL 5 MG/ML IV SOLN
INTRAVENOUS | Status: DC | PRN
  Administered 2021-05-23 (×2): 10 mg via INTRAVENOUS

## 2021-05-23 MED ORDER — ATORVASTATIN CALCIUM 40 MG PO TABS
40.0000 mg | ORAL_TABLET | Freq: Every day | ORAL | Status: DC
  Administered 2021-05-24: 40 mg via ORAL
  Filled 2021-05-23: qty 1

## 2021-05-23 MED ORDER — SODIUM CHLORIDE 0.9 % IV SOLN
INTRAVENOUS | Status: AC | PRN
  Administered 2021-05-23 (×2): 1000 mL

## 2021-05-23 MED ORDER — PANTOPRAZOLE SODIUM 40 MG PO TBEC
40.0000 mg | DELAYED_RELEASE_TABLET | Freq: Every day | ORAL | Status: DC
  Administered 2021-05-24: 40 mg via ORAL
  Filled 2021-05-23: qty 1

## 2021-05-23 MED ORDER — CHLORHEXIDINE GLUCONATE 0.12 % MT SOLN
15.0000 mL | Freq: Once | OROMUCOSAL | Status: DC
  Filled 2021-05-23: qty 15

## 2021-05-23 MED ORDER — CARVEDILOL 12.5 MG PO TABS
12.5000 mg | ORAL_TABLET | Freq: Two times a day (BID) | ORAL | Status: DC
  Administered 2021-05-23: 12.5 mg via ORAL
  Filled 2021-05-23 (×3): qty 1

## 2021-05-23 MED ORDER — CLOPIDOGREL BISULFATE 75 MG PO TABS
75.0000 mg | ORAL_TABLET | Freq: Every day | ORAL | Status: DC
  Administered 2021-05-24: 75 mg via ORAL
  Filled 2021-05-23: qty 1

## 2021-05-23 MED ORDER — HEPARIN (PORCINE) IN NACL 1000-0.9 UT/500ML-% IV SOLN
INTRAVENOUS | Status: AC
  Filled 2021-05-23: qty 500

## 2021-05-23 MED ORDER — LIDOCAINE 2% (20 MG/ML) 5 ML SYRINGE
INTRAMUSCULAR | Status: DC | PRN
  Administered 2021-05-23: 4 mg via INTRAVENOUS

## 2021-05-23 MED ORDER — SACUBITRIL-VALSARTAN 24-26 MG PO TABS
1.0000 | ORAL_TABLET | Freq: Two times a day (BID) | ORAL | Status: DC
  Administered 2021-05-24: 1 via ORAL
  Filled 2021-05-23: qty 1

## 2021-05-23 MED ORDER — EPHEDRINE SULFATE-NACL 50-0.9 MG/10ML-% IV SOSY
PREFILLED_SYRINGE | INTRAVENOUS | Status: DC | PRN
  Administered 2021-05-23: 5 mg via INTRAVENOUS

## 2021-05-23 MED ORDER — DEXAMETHASONE SODIUM PHOSPHATE 10 MG/ML IJ SOLN
INTRAMUSCULAR | Status: DC | PRN
  Administered 2021-05-23: 5 mg via INTRAVENOUS

## 2021-05-23 MED ORDER — HEPARIN (PORCINE) IN NACL 1000-0.9 UT/500ML-% IV SOLN
INTRAVENOUS | Status: DC | PRN
Start: 2021-05-23 — End: 2021-05-23
  Administered 2021-05-23: 500 mL

## 2021-05-23 SURGICAL SUPPLY — 20 items
BLANKET WARM UNDERBOD FULL ACC (MISCELLANEOUS) ×2 IMPLANT
CATH MITRA STEERABLE GUIDE (CATHETERS) ×2 IMPLANT
CLIP MITRA G4 DELIVERY SYS NTW (Clip) ×2 IMPLANT
CLOSURE PERCLOSE PROSTYLE (VASCULAR PRODUCTS) ×4 IMPLANT
ELECT DEFIB PAD ADLT CADENCE (PAD) ×2 IMPLANT
GUIDEWIRE SAFE TJ AMPLATZ EXST (WIRE) ×2 IMPLANT
HEMOSTAT KELLY 5.5 SS STRL (MISCELLANEOUS) ×4 IMPLANT
KIT DILATOR VASC 18G NDL (KITS) ×2 IMPLANT
KIT HEART LEFT (KITS) ×4 IMPLANT
KIT MICROPUNCTURE NIT STIFF (SHEATH) ×2 IMPLANT
KIT VERSACROSS FXD (WIRE) ×2 IMPLANT
KIT VERSACROSS LRG ACCESS (CATHETERS) ×2 IMPLANT
PACK CARDIAC CATHETERIZATION (CUSTOM PROCEDURE TRAY) ×2 IMPLANT
SHEATH PINNACLE 8F 10CM (SHEATH) ×2 IMPLANT
SHEATH PROBE COVER 6X72 (BAG) ×2 IMPLANT
STOPCOCK MORSE 400PSI 3WAY (MISCELLANEOUS) ×12 IMPLANT
SYSTEM MITRACLIP G4 (SYSTAGENIX WOUND MANAGEMENT) ×2 IMPLANT
TRANSDUCER W/STOPCOCK (MISCELLANEOUS) ×2 IMPLANT
TUBING ART PRESS 72  MALE/FEM (TUBING) ×2
TUBING ART PRESS 72 MALE/FEM (TUBING) ×2 IMPLANT

## 2021-05-23 NOTE — Discharge Summary (Addendum)
[facility] VALVE TEAM  Discharge Summary    Patient ID: Howard Thompson MRN: [id number]; DOB: 1966-10-04  Admit date: 05/23/2021 Discharge date: 05/24/2021  Primary Care Provider: Jamesetta Geralds, MD  Primary Cardiologist: Shelva Majestic, MD   Discharge Diagnoses    Principal Problem:   S/P mitral valve clip implantation Active Problems:   Polysubstance abuse ([place])   Ischemic cardiomyopathy   Hyperlipidemia LDL goal <70   Anemia due to chronic blood loss   Protein-calorie malnutrition, severe   CAD in native artery   AVM (arteriovenous malformation) of small bowel, acquired with hemorrhage   Essential hypertension   Severe mitral regurgitation   COPD (chronic obstructive pulmonary disease) ([place])   PAD (peripheral artery disease) ([place])   Allergies No Known Allergies  Diagnostic Studies/Procedures    05/23/21 MITRAL VALVE REPAIR    Conclusion  Successful transcatheter edge to edge mitral valve repair with placement of a single MitraClip NTW device A2/P2, reducing MR from moderate to mild. Mean gradient increased from 1 mmHg to 7 mmHg post-Clip placement with a HR of approximately 80 bpm   Plan: ASA and plavix x 3 months, then continue ASA alone, beta blockade as tolerated to a HR of about 60 bpm.    Recommendations  Antiplatelet/Anticoag Recommend uninterrupted dual antiplatelet therapy with Aspirin '81mg'$  daily and Clopidogrel '75mg'$  daily for 3 months.   _____________   Echo 05/24/21: completed but pending formal read at the time of discharge    History of Present Illness     Howard Thompson is a 55 y.o. male with a history of HTN, HLD, CAD, ischemic cardiomyopathy, myasthenia gravis and had a thymectomy by sternotomy, PAD, smoking, polysubstance abuse, moderate AI and severe non rheumatic mitral regurgitation who presented [facility] on 05/23/21 for planned TEER.   Patient had left femoral endarterectomy in 2016 and later left  femoral artery bypass in 2017 at [facility].  In 2019, he had anterior STEMI complicated by cardiac arrest.  EF by echo at this time was 25-30%.  Hospitalization was complicated by GI bleeding and AKI requiring temporary HD.  He had DES to proximal LAD. In 2/20, he had recurrent anterior STEMI with totally occluded proximal LAD treated with aspiration thrombectomy and PCI.     He additionally has myasthenia gravis and had a thymectomy in the past (required sternotomy).     He has a history of cocaine, amphetamine, and marijuana abuse (none x 2 years). He is an active smoker.    TTE showed significant MR recently, so TEE was done in 5/22. This showed LV EF 25-30%, moderate RV enlargement with mildly decreased RV systolic function, severe MR with ERO 0.65.  Right and left heart cath in 5/22 showed elevated PCWP with prominent V waves, preserved cardiac output, patent LAD stents.  Patient has seen Dr. Burt Knack regarding Mitraclip placement. He reported exertional dyspnea with moderate activity such as walking up a flight of stairs as well as significant generalized fatigue.  The patient was referred for advanced heart failure consultation and was seen by Dr. Aundra Dubin April 04, 2021.  He was on a good medical program already established with carvedilol, Entresto, spironolactone, and torsemide.  Wilder Glade was added to his regimen.  He was felt to be an appropriate candidate for transcatheter edge-to-edge mitral valve repair.  The patient has been evaluated by the multidisciplinary valve team and felt to have severe, symptomatic mitral regurgitation and to be a suitable candidate for TEER, which  was set up for 05/23/21.  Hospital Course     Consultants: none   Severe non rheumatic mitral regurgitation: s/p successful TEER with a single MitraClip NTW device A2/P2, reducing MR from moderate to mild. Mean gradient increased from 1 mmHg to 7 mmHg post-Clip placement with a HR of approximately 80 bpm. Coreg was increased  from 6.'25mg'$  BID to 12.'5mg'$  BID. Post operative echo completed but pending formal read at the time of discharge. Groin site is stable. Continued on home Plavix '75mg'$  daily without additional aspirin therapy as the patient told me he has had issues with bleeding on aspirin in the past. Plan for discharge home today with close follow up in the office next week.  Chronic systolic CHF: EF [id number]. Appears euvolemic and slightly dry since addition of Jardiance. Continue home meds Coreg (increased from 6.'25mg'$  BID to 12.5 mg BID given mildly elevated mean gradients after clip), Entresto 24-'26mg'$  daily, spiro '25mg'$  daily, and Jardiance '10mg'$  daily. Will stop torsemide '20mg'$  daily at discharge. Will check BMET in office next week.   _____________  Discharge Vitals Blood pressure (!) 135/59, pulse 78, temperature 97.8 F (36.6 C), temperature source Oral, resp. rate (!) 25, height '5\' 9"'$  (1.753 m), weight 75.6 kg, SpO2 98 %.  Filed Weights   05/23/21 0807  Weight: 75.6 kg    GEN: Well nourished, well developed, in no acute distress HEENT: normal Neck: no JVD or masses Cardiac: RRR; very soft murmur at apex. No rubs, or gallops,no edema  Respiratory:  clear to auscultation bilaterally, normal work of breathing GI: soft, nontender, nondistended, + BS MS: no deformity or atrophy Skin: warm and dry, no rash.  Groin site clear without hematoma or ecchymosis Neuro:  Alert and Oriented x 3, Strength and sensation are intact Psych: euthymic mood, full affect   Labs & Radiologic Studies    CBC Recent Labs    05/21/21 1400 05/24/21 0038  WBC 6.9 6.5  HGB 10.7* 9.4*  HCT 36.3* 31.4*  MCV 73.2* 71.9*  PLT 313 [id number]   Basic Metabolic Panel Recent Labs    05/21/21 1400 05/24/21 0038  NA 137 136  K 4.2 4.5  CL 107 106  CO2 23 23  GLUCOSE 96 156*  BUN 18 18  CREATININE 1.19 1.20  CALCIUM 9.2 8.7*   Liver Function Tests Recent Labs    05/21/21 1400  AST 23  ALT 17  ALKPHOS 73  BILITOT 0.3   PROT 7.4  ALBUMIN 4.0   No results for input(s): LIPASE, AMYLASE in the last 72 hours. Cardiac Enzymes No results for input(s): CKTOTAL, CKMB, CKMBINDEX, TROPONINI in the last 72 hours. BNP Invalid input(s): POCBNP D-Dimer No results for input(s): DDIMER in the last 72 hours. Hemoglobin A1C No results for input(s): HGBA1C in the last 72 hours. Fasting Lipid Panel No results for input(s): CHOL, HDL, LDLCALC, TRIG, CHOLHDL, LDLDIRECT in the last 72 hours. Thyroid Function Tests No results for input(s): TSH, T4TOTAL, T3FREE, THYROIDAB in the last 72 hours.  Invalid input(s): FREET3 _____________  DG Chest 2 View  Result Date: 05/22/2021 CLINICAL DATA:  Preop mitral valve regurgitation EXAM: CHEST - 2 VIEW COMPARISON:  08/23/2020 FINDINGS: No focal consolidation. No pleural effusion or pneumothorax. Heart size is normal. Prior median sternotomy. Calcified left hilar lymph node likely reflecting sequela prior granulomatous disease. No acute osseous abnormality. IMPRESSION: No active cardiopulmonary disease. Electronically Signed   By: Kathreen Devoid   On: 05/22/2021 11:37   CARDIAC CATHETERIZATION  Result Date:  05/23/2021 Successful transcatheter edge to edge mitral valve repair with placement of a single MitraClip NTW device A2/P2, reducing MR from moderate to mild. Mean gradient increased from 1 mmHg to 7 mmHg post-Clip placement with a HR of approximately 80 bpm Plan: ASA and plavix x 3 months, then continue ASA alone, beta blockade as tolerated to a HR of about 60 bpm.   ECHO TEE  Result Date: 05/23/2021    TRANSESOPHOGEAL ECHO REPORT   Patient Name:   SAMRUDH BORGO Date of Exam: 05/23/2021 Medical Rec #:  [id number]    Height:       69.0 in Accession #:    [id number]   Weight:       166.7 lb Date of Birth:  1966/01/29    BSA:          1.912 m Patient Age:    55 years     BP:           111/58 mmHg Patient Gender: M            HR:           84 bpm. Exam Location:  Inpatient Procedure: 3D  Echo, Transesophageal Echo, Cardiac Doppler and Color Doppler Indications:    Severe mitral regurgitation [id number]  History:        Patient has prior history of Echocardiogram examinations, most                 recent 02/28/2021. CHF, CAD and Previous Myocardial Infarction,                 Aortic Valve Disease and Mitral Valve Disease; Risk                 Factors:Hypertension. Cardiomyopathy.                  Mitral Valve: MitaClip NTW valve is present in the mitral                 position. Procedure Date: 05/23/2021.  Sonographer:    Darlina Sicilian RDCS Referring Phys: [phone] Ainsleigh Kakos PROCEDURE: After discussion of the risks and benefits of a TEE, an informed consent was obtained from the patient. The patient was intubated. The transesophogeal probe was passed without difficulty through the esophogus of the patient. Imaged were obtained with the patient in a supine position. Sedation performed by different physician. The patient was monitored while under deep sedation., '4mg'$  of Lidocaine. Image quality was good. The patient developed no complications during the procedure. IMPRESSIONS  1. Functional MR due to restricted posterior leaflet motion and LVE Degree of MR much worse on prior TEE CHF medicines optimized and low filling pressures now Moderate MR at beginning of case. A single NTW clip placed under 3D guidance between A2P2 Resultant mild MR at end of case.  2. Post trans septal sheath removal only left to right shunting through puncture site.  3. Diffuse hypokinesis worse in the anterior wall. Left ventricular ejection fraction, by estimation, is 25 to 30%. The left ventricle has severely decreased function. The left ventricle demonstrates global hypokinesis. The left ventricular internal cavity size was severely dilated.  4. Right ventricular systolic function is normal. The right ventricular size is normal.  5. Left atrial size was severely dilated. No left atrial/left atrial appendage thrombus was  detected.  6. Right atrial size was moderately dilated.  7. The mitral valve has been repaired/replaced. Mild mitral valve regurgitation. There is a MitaClip NTW present  in the mitral position. Procedure Date: 05/23/2021.  8. Tricuspid valve regurgitation is moderate.  9. The aortic valve is tricuspid. Aortic valve regurgitation is moderate. FINDINGS  Left Ventricle: Diffuse hypokinesis worse in the anterior wall. Left ventricular ejection fraction, by estimation, is 25 to 30%. The left ventricle has severely decreased function. The left ventricle demonstrates global hypokinesis. The left ventricular  internal cavity size was severely dilated. There is no left ventricular hypertrophy. Right Ventricle: The right ventricular size is normal. No increase in right ventricular wall thickness. Right ventricular systolic function is normal. Left Atrium: Left atrial size was severely dilated. No left atrial/left atrial appendage thrombus was detected. Right Atrium: Right atrial size was moderately dilated. Pericardium: There is no evidence of pericardial effusion. Mitral Valve: The mitral valve has been repaired/replaced. Mild mitral valve regurgitation. There is a MitaClip NTW present in the mitral position. Procedure Date: 05/23/2021. MV peak gradient, 7.0 mmHg. The mean mitral valve gradient is 4.0 mmHg. Tricuspid Valve: The tricuspid valve is normal in structure. Tricuspid valve regurgitation is moderate. Aortic Valve: The aortic valve is tricuspid. Aortic valve regurgitation is moderate. Aortic regurgitation PHT measures 440 msec. Aortic valve mean gradient measures 12.0 mmHg. Aortic valve peak gradient measures 20.4 mmHg. Pulmonic Valve: The pulmonic valve was normal in structure. Pulmonic valve regurgitation is mild. Aorta: The aortic root is normal in size and structure. IAS/Shunts: No atrial level shunt detected by color flow Doppler. Additional Comments: Post trans septal sheath removal only left to right shunting  through puncture site.  AORTIC VALVE AV Vmax:           226.00 cm/s AV Vmean:          160.000 cm/s AV VTI:            0.400 m AV Peak Grad:      20.4 mmHg AV Mean Grad:      12.0 mmHg LVOT Vmax:         103.00 cm/s LVOT Vmean:        78.800 cm/s LVOT VTI:          0.199 m LVOT/AV VTI ratio: 0.50 AI PHT:            440 msec MITRAL VALVE MV Area (plan): 8.98 cm     SHUNTS MV Peak grad:   7.0 mmHg     Systemic VTI: 0.20 m MV Mean grad:   4.0 mmHg MV Vmax:        1.32 m/s MV Vmean:       89.4 cm/s MR Peak grad:    91.8 mmHg MR Mean grad:    62.0 mmHg MR Vmax:         479.00 cm/s MR Vmean:        372.0 cm/s MR PISA:         5.09 cm MR PISA Eff ROA: 33 mm MR PISA Radius:  0.90 cm Jenkins Rouge MD Electronically signed by Jenkins Rouge MD Signature Date/Time: 05/23/2021/2:38:44 PM    Final    Disposition   Pt is being discharged home today in good condition.  Follow-up Plans & Appointments     Follow-up Information     Eileen Stanford, PA-C. Go on 05/30/2021.   Specialties: Cardiology, Radiology Why: @ 1:00pm, please arrive at least 10 minutes early Contact information: [address] [place] [id number] [phone]                  Discharge  Medications   Allergies as of 05/24/2021   No Known Allergies      Medication List     STOP taking these medications    ibuprofen 200 MG tablet Commonly known as: ADVIL   torsemide 20 MG tablet Commonly known as: DEMADEX       TAKE these medications    atorvastatin 40 MG tablet Commonly known as: LIPITOR Take 1 tablet (40 mg total) by mouth daily.   azithromycin 250 MG tablet Commonly known as: ZITHROMAX Take 250-500 mg by mouth See admin instructions. Take 500 by mouth on day 1, then take 250 mg daily for days 2-5   carvedilol 12.5 MG tablet Commonly known as: COREG Take 1 tablet (12.5 mg total) by mouth 2 (two) times daily with a meal. What changed:  medication strength how much to take    clopidogrel 75 MG tablet Commonly known as: PLAVIX Take 1 tablet (75 mg total) by mouth daily.   empagliflozin 10 MG Tabs tablet Commonly known as: Jardiance Take 1 tablet (10 mg total) by mouth daily before breakfast.   Entresto 24-26 MG Generic drug: sacubitril-valsartan Take 1 tablet by mouth 2 (two) times daily.   loratadine 10 MG tablet Commonly known as: CLARITIN Take 10 mg by mouth daily as needed for allergies.   NETI POT SINUS [place] NA Place 1 application into the nose once a week.   nitroGLYCERIN 0.4 MG SL tablet Commonly known as: NITROSTAT Place 1 tablet (0.4 mg total) under the tongue every 5 (five) minutes as needed for chest pain (max 3 doses).   pantoprazole 40 MG tablet Commonly known as: Protonix Take 1 tablet (40 mg total) by mouth daily.   spironolactone 25 MG tablet Commonly known as: ALDACTONE TAKE 1 TABLET (25 MG TOTAL) BY MOUTH DAILY.         Outstanding Labs/Studies   BMET  Duration of Discharge Encounter   Greater than 30 minutes including physician time.  Mable Fill, PA-C 05/24/2021, 9:55 AM [id number]  Patient seen, examined. Available data reviewed. Agree with findings, assessment, and plan as outlined by Nell Range, PA-C.  The patient is independently interviewed and examined.  Agree with findings above.  Alert and oriented, no distress.  Sitting up in a chair.  HEENT normal, JVP normal, lungs clear bilaterally, heart regular rate and rhythm with a 1/6 holosystolic murmur at the apex improved from baseline, abdomen soft and nontender, extremities without edema, right groin site clear with no hematoma or ecchymosis.  Patient awaiting 2D echocardiogram.  Telemetry is reviewed and shows normal sinus rhythm with no significant arrhythmia.  MitraClip procedure noted with reduction of mitral regurgitation from 3+ to 1+ (baseline ERO 0.33) with increase in mean transmitral gradient to 7 mmHg post procedure.  Once 2D echo  completed, patient okay for discharge home.  He has had problems with aspirin in the past and will go home on clopidogrel only.  Sherren Mocha, M.D. 05/24/2021 10:51 AM

## 2021-05-23 NOTE — Anesthesia Procedure Notes (Signed)
Procedure Name: Intubation Date/Time: 05/23/2021 12:36 PM Performed by: Reece Agar, CRNA Pre-anesthesia Checklist: Patient identified, Emergency Drugs available, Suction available and Patient being monitored Patient Re-evaluated:Patient Re-evaluated prior to induction Oxygen Delivery Method: Circle System Utilized Preoxygenation: Pre-oxygenation with 100% oxygen Induction Type: IV induction Ventilation: Mask ventilation without difficulty and Oral airway inserted - appropriate to patient size Laryngoscope Size: Miller and 3 Grade View: Grade I Tube type: Oral Tube size: 7.5 mm Number of attempts: 1 Airway Equipment and Method: Stylet and Oral airway Placement Confirmation: ETT inserted through vocal cords under direct vision, positive ETCO2 and breath sounds checked- equal and bilateral Secured at: 21 cm Tube secured with: Tape Dental Injury: Teeth and Oropharynx as per pre-operative assessment

## 2021-05-23 NOTE — Interval H&P Note (Signed)
History and Physical Interval Note:  05/23/2021 9:22 AM  Howard Thompson  has presented today for surgery, with the diagnosis of Severe Mitral Insufficiency.  The various methods of treatment have been discussed with the patient and family. After consideration of risks, benefits and other options for treatment, the patient has consented to  Procedure(s): MITRAL VALVE REPAIR (N/A) TRANSESOPHAGEAL ECHOCARDIOGRAM (TEE) (N/A) as a surgical intervention.  The patient's history has been reviewed, patient examined, no change in status, stable for surgery.  I have reviewed the patient's chart and labs.  Questions were answered to the patient's satisfaction.     Sherren Mocha

## 2021-05-23 NOTE — Anesthesia Procedure Notes (Signed)
Arterial Line Insertion Start/End7/28/2022 9:05 AM, 05/23/2021 9:15 AM Performed by: Moshe Salisbury, CRNA, CRNA  Patient location: Pre-op. Preanesthetic checklist: patient identified, IV checked, site marked, risks and benefits discussed, surgical consent, monitors and equipment checked, pre-op evaluation, timeout performed and anesthesia consent Lidocaine 1% used for infiltration Right, radial was placed Catheter size: 20 G Hand hygiene performed , maximum sterile barriers used  and Seldinger technique used  Attempts: 1 Procedure performed without using ultrasound guided technique. Following insertion, dressing applied and Biopatch. Post procedure assessment: normal  Patient tolerated the procedure well with no immediate complications.

## 2021-05-23 NOTE — Anesthesia Postprocedure Evaluation (Signed)
Anesthesia Post Note  Patient: Howard Thompson  Procedure(s) Performed: MITRAL VALVE REPAIR TRANSESOPHAGEAL ECHOCARDIOGRAM (TEE)     Patient location during evaluation: Nursing Unit Anesthesia Type: General Level of consciousness: awake and alert, patient cooperative and oriented Pain management: pain level controlled Vital Signs Assessment: post-procedure vital signs reviewed and stable Respiratory status: spontaneous breathing, nonlabored ventilation and respiratory function stable Cardiovascular status: blood pressure returned to baseline and stable Postop Assessment: no apparent nausea or vomiting and adequate PO intake Anesthetic complications: no   No notable events documented.  Last Vitals:  Vitals:   05/23/21 1700 05/23/21 1800  BP: 102/60 (!) 95/55  Pulse: 83 74  Resp: 11 14  Temp:    SpO2: 98% 97%    Last Pain:  Vitals:   05/23/21 1532  TempSrc: Oral  PainSc: 2                  Otha Monical,E. Oluwaseun Cremer

## 2021-05-23 NOTE — Transfer of Care (Signed)
Immediate Anesthesia Transfer of Care Note  Patient: Tashi Brkic  Procedure(s) Performed: MITRAL VALVE REPAIR TRANSESOPHAGEAL ECHOCARDIOGRAM (TEE)  Patient Location: PACU  Anesthesia Type:General  Level of Consciousness: awake and alert   Airway & Oxygen Therapy: Patient Spontanous Breathing and Patient connected to face mask oxygen  Post-op Assessment: Report given to RN and Post -op Vital signs reviewed and stable  Post vital signs: Reviewed and stable  Last Vitals:  Vitals Value Taken Time  BP 106/53 05/23/21 1439  Temp 36.6 C 05/23/21 1430  Pulse 72 05/23/21 1439  Resp 13 05/23/21 1439  SpO2 98 % 05/23/21 1439  Vitals shown include unvalidated device data.  Last Pain:  Vitals:   05/23/21 1430  TempSrc: Temporal  PainSc: Asleep      Patients Stated Pain Goal: 0 ([id number])  Complications: No notable events documented.

## 2021-05-23 NOTE — Anesthesia Preprocedure Evaluation (Signed)
Anesthesia Evaluation  Patient identified by MRN, date of birth, ID band Patient awake    Reviewed: Allergy & Precautions, H&P , NPO status , Patient's Chart, lab work & pertinent test results  Airway Mallampati: II   Neck ROM: full    Dental   Pulmonary sleep apnea , COPD, Current Smoker,    breath sounds clear to auscultation       Cardiovascular hypertension, + CAD, + Past MI, + Cardiac Stents, + Peripheral Vascular Disease and +CHF  + Valvular Problems/Murmurs MR  Rhythm:regular Rate:Normal  EF 25-30%, severe MR, globak HK of LV.   Neuro/Psych Myasthenia gravis  Neuromuscular disease    GI/Hepatic GERD  ,H/o GI bleed   Endo/Other    Renal/GU      Musculoskeletal   Abdominal   Peds  Hematology   Anesthesia Other Findings   Reproductive/Obstetrics                             Anesthesia Physical Anesthesia Plan  ASA: 3  Anesthesia Plan: General   Post-op Pain Management:    Induction: Intravenous  PONV Risk Score and Plan: 1 and Ondansetron, Dexamethasone, Midazolam and Treatment may vary due to age or medical condition  Airway Management Planned: Oral ETT  Additional Equipment: Arterial line  Intra-op Plan:   Post-operative Plan: Extubation in OR  Informed Consent: I have reviewed the patients History and Physical, chart, labs and discussed the procedure including the risks, benefits and alternatives for the proposed anesthesia with the patient or authorized representative who has indicated his/her understanding and acceptance.     Dental advisory given  Plan Discussed with: CRNA, Anesthesiologist and Surgeon  Anesthesia Plan Comments:         Anesthesia Quick Evaluation

## 2021-05-23 NOTE — Progress Notes (Signed)
  Echocardiogram Echocardiogram Transesophageal has been performed.  Darlina Sicilian M 05/23/2021, 2:26 PM

## 2021-05-23 NOTE — Discharge Instructions (Signed)
Home Care Following Your MitraClip Procedure      If you have any questions or concerns you can call the structural heart office at [phone] during normal business hours 8am-4pm. If you have an urgent need after hours or on the weekend, please call [phone] to talk to the on call provider for general cardiology. If you have an emergency that requires immediate attention, please call 911.   Groin Site Care Refer to this sheet in the next few weeks. These instructions provide you with information on caring for yourself after your procedure. Your caregiver may also give you more specific instructions. Your treatment has been planned according to current medical practices, but problems sometimes occur. Call your caregiver if you have any problems or questions after your procedure. HOME CARE INSTRUCTIONS  You may shower 24 hours after the procedure. Remove the bandage (dressing) and gently wash the site with plain soap and water. Gently pat the site dry.   Do not apply powder or lotion to the site.   Do not sit in a bathtub, swimming pool, or whirlpool for 5 to 7 days.   No bending, squatting, or lifting anything over 10 pounds (4.5 kg) as directed by your caregiver.   Inspect the site at least twice daily.   Do not drive home if you are discharged the same day of the procedure. Have someone else drive you.   You may drive 72 hours after the procedure unless otherwise instructed by your caregiver.  What to expect:  Any bruising will usually fade within 1 to 2 weeks.   Blood that collects in the tissue (hematoma) may be painful to the touch. It should usually decrease in size and tenderness within 1 to 2 weeks.  SEEK IMMEDIATE MEDICAL CARE IF:  You have unusual pain at the groin site or down the affected leg.   You have redness, warmth, swelling, or pain at the groin site.   You have drainage (other than a small amount of blood on the dressing).   You have chills.   You have a  fever or persistent symptoms for more than 72 hours.   You have a fever and your symptoms suddenly get worse.   Your leg becomes pale, cool, tingly, or numb.   You have bleeding from the site. Hold pressure on the site until it subsides.    After MitraClip Checklist  Check  Test Description   Follow up appointment in 1-2 weeks  Most of our patients will see our structural heart physician assistant, Katie Maranda Marte, or your primary cardiologist within 1-2 weeks. Your incision site will be checked and you will be cleared to resume all normal activities if you are doing well.     1 month echo and follow up  You will have an echo to check on your heart valve clip and be seen back in the office by Katie Zaidee Rion PA-C.   Follow up with your primary cardiologist You will need to be seen by your primary cardiologist in the following 3-6 months after your 1 month appointment in the valve clinic. Often times your Plavix or Aspirin will be discontinued during this time, but this is decided on a case by case basis.    1 year echo and follow up You will have another echo to check on your heart valve after one year and be seen back in the office by Katie Oksana Deberry. This your last structural heart visit.   Bacterial endocarditis prophylaxis  You will   have to take antibiotics for the rest of your life before all dental procedures (even dental cleanings) to protect your heart valve from potential infection. Antibiotics are also required before some surgeries. Please check with your cardiologist before scheduling any surgeries. Also, please make sure to tell us if you have a penicillin allergy as you will require an alternative antibiotic.    ______________  Your Implant Identification Card Following your procedure, you will receive an Implant Identification Card, which your doctor will fill out and which you must carry with you at all times. Show your Implant Identification Card if you report to an emergency room.  This card identifies you as a patient who has had a MitraClip device implanted. If you require a magnetic resonance imaging (MRI) scan, tell your doctor or MRI technician that you have a MitraClip device implanted. Test results indicate that patients with the MitraClip device can safely undergo MRI scans under certain conditions described on the card.  

## 2021-05-24 ENCOUNTER — Inpatient Hospital Stay (HOSPITAL_COMMUNITY): Payer: No Typology Code available for payment source

## 2021-05-24 ENCOUNTER — Encounter (HOSPITAL_COMMUNITY): Payer: Self-pay | Admitting: Cardiovascular Disease

## 2021-05-24 DIAGNOSIS — I5022 Chronic systolic (congestive) heart failure: Secondary | ICD-10-CM | POA: Diagnosis not present

## 2021-05-24 DIAGNOSIS — Z006 Encounter for examination for normal comparison and control in clinical research program: Secondary | ICD-10-CM | POA: Diagnosis not present

## 2021-05-24 DIAGNOSIS — Z952 Presence of prosthetic heart valve: Secondary | ICD-10-CM

## 2021-05-24 DIAGNOSIS — Z95818 Presence of other cardiac implants and grafts: Secondary | ICD-10-CM

## 2021-05-24 DIAGNOSIS — I340 Nonrheumatic mitral (valve) insufficiency: Secondary | ICD-10-CM | POA: Diagnosis not present

## 2021-05-24 DIAGNOSIS — Z20822 Contact with and (suspected) exposure to covid-19: Secondary | ICD-10-CM | POA: Diagnosis not present

## 2021-05-24 DIAGNOSIS — Z9889 Other specified postprocedural states: Secondary | ICD-10-CM

## 2021-05-24 LAB — BASIC METABOLIC PANEL
Anion gap: 7 (ref 5–15)
BUN: 18 mg/dL (ref 6–20)
CO2: 23 mmol/L (ref 22–32)
Calcium: 8.7 mg/dL — ABNORMAL LOW (ref 8.9–10.3)
Chloride: 106 mmol/L (ref 98–111)
Creatinine, Ser: 1.2 mg/dL (ref 0.61–1.24)
GFR, Estimated: >60 mL/min (ref >60)
Glucose, Bld: 156 mg/dL — ABNORMAL HIGH (ref 70–99)
Potassium: 4.5 mmol/L (ref 3.5–5.1)
Sodium: 136 mmol/L (ref 135–145)

## 2021-05-24 LAB — CBC
HCT: 31.4 % — ABNORMAL LOW (ref 39.0–52.0)
Hemoglobin: 9.4 g/dL — ABNORMAL LOW (ref 13.0–17.0)
MCH: 21.5 pg — ABNORMAL LOW (ref 26.0–34.0)
MCHC: 29.9 g/dL — ABNORMAL LOW (ref 30.0–36.0)
MCV: 71.9 fL — ABNORMAL LOW (ref 80.0–100.0)
Platelets: 275 10*3/uL (ref 150–400)
RBC: 4.37 MIL/uL (ref 4.22–5.81)
RDW: 23.9 % — ABNORMAL HIGH (ref 11.5–15.5)
WBC: 6.5 10*3/uL (ref 4.0–10.5)
nRBC: 0 % (ref 0.0–0.2)

## 2021-05-24 LAB — ECHOCARDIOGRAM COMPLETE
AR max vel: 2.22 cm2
AV Area VTI: 2.23 cm2
AV Area mean vel: 2.27 cm2
AV Mean grad: 11 mmHg
AV Peak grad: 21.3 mmHg
Ao pk vel: 2.31 m/s
Area-P 1/2: 2.01 cm2
Height: 69 in
MV VTI: 1.87 cm2
P 1/2 time: 498 msec
S' Lateral: 4.6 cm
Single Plane A4C EF: 27.8 %
Weight: 2666.68 oz

## 2021-05-24 MED ORDER — CARVEDILOL 12.5 MG PO TABS
12.5000 mg | ORAL_TABLET | Freq: Two times a day (BID) | ORAL | 1 refills | Status: DC
Start: 2021-05-24 — End: 2021-05-30

## 2021-05-24 MED FILL — Heparin Sod (Porcine)-NaCl IV Soln 1000 Unit/500ML-0.9%: INTRAVENOUS | Qty: 500 | Status: AC

## 2021-05-24 NOTE — Progress Notes (Signed)
CARDIAC REHAB PHASE I   PRE:  Rate/Rhythm: 59 SB    BP: sitting 104/54    SaO2: 100 RA  MODE:  Ambulation: 680 ft   POST:  Rate/Rhythm: 89 SR    BP: sitting 128/66     SaO2: 99 RA  Quick pace, no SOB. Feels well. Sts his bad days were intermittent PTA therefore didn't want to say if he felt much better yet.  Discussed restrictions, exercise, daily wts, low sodium diet, smoking cessation, NTG and CRPII. Pt voiced understanding. He plans to not smoke but voices significant struggles with maintaining cessation. Gave resources and tips. Declines CRPII as too far of a drive. Declined HF booklet, has at home. He is in the practice of daily wts and following low sodium diet. [place], ACSM 05/24/2021 9:34 AM

## 2021-05-24 NOTE — Progress Notes (Signed)
  Echocardiogram 2D Echocardiogram has been performed.  Howard Thompson 05/24/2021, 2:09 PM

## 2021-05-24 NOTE — Plan of Care (Signed)
  Problem: Education: Goal: Knowledge of General Education information will improve Description: Including pain rating scale, medication(s)/side effects and non-pharmacologic comfort measures Outcome: Progressing   Problem: Health Behavior/Discharge Planning: Goal: Ability to manage health-related needs will improve Outcome: Progressing   Problem: Clinical Measurements: Goal: Ability to maintain clinical measurements within normal limits will improve Outcome: Progressing Goal: Cardiovascular complication will be avoided Outcome: Progressing   Problem: Skin Integrity: Goal: Risk for impaired skin integrity will decrease Outcome: Progressing   

## 2021-05-24 NOTE — Plan of Care (Signed)
  Problem: Education: Goal: Knowledge of General Education information will improve Description: Including pain rating scale, medication(s)/side effects and non-pharmacologic comfort measures 05/24/2021 1500 by Thressa Sheller, RN Outcome: Adequate for Discharge 05/24/2021 1235 by Thressa Sheller, RN Outcome: Progressing   Problem: Health Behavior/Discharge Planning: Goal: Ability to manage health-related needs will improve 05/24/2021 1500 by Thressa Sheller, RN Outcome: Adequate for Discharge 05/24/2021 1235 by Thressa Sheller, RN Outcome: Progressing   Problem: Clinical Measurements: Goal: Ability to maintain clinical measurements within normal limits will improve 05/24/2021 1500 by Thressa Sheller, RN Outcome: Adequate for Discharge 05/24/2021 1235 by Thressa Sheller, RN Outcome: Progressing Goal: Will remain free from infection Outcome: Adequate for Discharge Goal: Diagnostic test results will improve Outcome: Adequate for Discharge Goal: Respiratory complications will improve Outcome: Adequate for Discharge Goal: Cardiovascular complication will be avoided 05/24/2021 1500 by Thressa Sheller, RN Outcome: Adequate for Discharge 05/24/2021 1235 by Thressa Sheller, RN Outcome: Progressing   Problem: Activity: Goal: Risk for activity intolerance will decrease Outcome: Adequate for Discharge   Problem: Nutrition: Goal: Adequate nutrition will be maintained Outcome: Adequate for Discharge   Problem: Coping: Goal: Level of anxiety will decrease Outcome: Adequate for Discharge   Problem: Elimination: Goal: Will not experience complications related to bowel motility Outcome: Adequate for Discharge Goal: Will not experience complications related to urinary retention Outcome: Adequate for Discharge   Problem: Pain Managment: Goal: General experience of comfort will improve Outcome: Adequate for Discharge   Problem: Safety: Goal: Ability to remain free  from injury will improve Outcome: Adequate for Discharge   Problem: Skin Integrity: Goal: Risk for impaired skin integrity will decrease 05/24/2021 1500 by Thressa Sheller, RN Outcome: Adequate for Discharge 05/24/2021 1235 by Thressa Sheller, RN Outcome: Progressing

## 2021-05-27 ENCOUNTER — Telehealth: Payer: Self-pay

## 2021-05-27 NOTE — Telephone Encounter (Signed)
Attempted to reach the pt but his voicemail is full and will not accept any new messages.

## 2021-05-29 ENCOUNTER — Encounter (HOSPITAL_COMMUNITY): Payer: Self-pay | Admitting: Cardiovascular Disease

## 2021-05-29 NOTE — Progress Notes (Signed)
HEART AND [place]                                     Cardiology Office Note:    Date:  05/30/2021   ID:  [name], DOB 1966/04/18, MRN [id number]  PCP:  Jamesetta Geralds, MD  [facility] Cardiologist:  Shelva Majestic, MD  [place] Electrophysiologist:  None   Referring MD: Jamesetta Geralds, *   TOC s/p MitraClip  History of Present Illness:    Howard Thompson is a 55 y.o. male with a hx of HTN, HLD, CAD, ischemic cardiomyopathy, myasthenia gravis and had a thymectomy by sternotomy, PAD, smoking, polysubstance abuse, moderate AI and severe non rheumatic mitral regurgitation s/p TEER (05/23/21) who presents to clinic for follow up.   Patient had left femoral endarterectomy in 2016 and later left femoral artery bypass in 2017 at [facility].  In 2019, he had anterior STEMI complicated by cardiac arrest.  EF by echo at this time was 25-30%.  Hospitalization was complicated by GI bleeding and AKI requiring temporary HD.  He had DES to proximal LAD. In 2/20, he had recurrent anterior STEMI with totally occluded proximal LAD treated with aspiration thrombectomy and PCI.     He additionally has myasthenia gravis and had a thymectomy in the past (required sternotomy). He has a history of cocaine, amphetamine, and marijuana abuse (none x 2 years). He is an active smoker.     TTE showed significant MR recently, so TEE was done in 5/22. This showed LV EF 25-30%, moderate RV enlargement with mildly decreased RV systolic function, severe MR with ERO 0.65.  Right and left heart cath in 5/22 showed elevated PCWP with prominent V waves, preserved cardiac output, patent LAD stents.  Patient has seen Dr. Burt Knack regarding Mitraclip placement. He reported exertional dyspnea with moderate activity such as walking up a flight of stairs as well as significant generalized fatigue.  The patient was referred for advanced heart failure consultation and was  seen by Dr. Aundra Dubin April 04, 2021.  He was on a good medical program already established with carvedilol, Entresto, spironolactone, and torsemide.  Wilder Glade was added to his regimen.  He was felt to be an appropriate candidate for transcatheter edge-to-edge mitral valve repair.   He was evaluated by the multidisciplinary valve team and underwent successful TEER with a single MitraClip NTW device A2/P2, reducing MR from moderate to mild. Mean gradient increased from 1 mmHg to 7 mmHg post-Clip placement with a HR of approximately 80 bpm. Coreg was increased from 6.'25mg'$  BID to 12.'5mg'$  BID. Post operative echo showed EF 30-35%, mild residual MR with mild MS  (mean gradient of 6 mm Hg). He was continued on home Plavix '75mg'$  daily without additional aspirin therapy as the patient told me he has had issues with bleeding on aspirin in the past. The patient appeared euvolemic but slightly dry since addition of Jardiance when seen by Dr. Aundra Dubin. He was continued on home meds Coreg (increased from 6.'25mg'$  BID to 12.5 mg BID given mildly elevated mean gradients after clip), Entresto 24-'26mg'$  daily, spiro '25mg'$  daily, and Jardiance '10mg'$  daily, but torsemide '20mg'$  daily was discontinued at discharge. Discharge weight 166lbs.  Today the patient presents to clinic for follow up. Here with friend. He has generally been feeling well and has been quite busy over past week and may have "overdone it".  Today he has been feeling dizzy and weak. Has had pre syncope when standing too fast. No LE edema, orthopnea or PND. No syncope. No chest pain.    Past Medical History:  Diagnosis Date   Acute renal failure ([place])    a. during 7-05/2018 adm for cardiac arrest, requiring HD temporarily.   Alcohol abuse    Anemia    AVM (arteriovenous malformation) of colon    Cardiac arrest ([place]) 05/22/2018   Vfib   CHF (congestive heart failure) (HCC)    Cocaine abuse (HCC)    COPD (chronic obstructive pulmonary disease) (HCC)    Coronary artery  disease    a. hx of POBA in [place]. b. out-of-hospital cardiac arrest 04/2018 (vfib/STEMI) s/p DES to prox LAD (complex admission with GIB, renal failure requiring HD, encephalopathy). c. STEMI 11/2018 after pt stopped Plavix - s/p DES to LAD.   Duodenitis    GERD (gastroesophageal reflux disease)    GI bleed    History of blood transfusion    Hyperlipidemia    Iron deficiency anemia    Ischemic cardiomyopathy    a. EF <25% at time of cath/arrest 04/2018, improved to 40-45% by f/u echo 06/09/18.   Myasthenia gravis ([place])    Myocardial infarction ([place]) 05/22/2018   STEMI   PAD (peripheral artery disease) (HCC)    a. femoral bypass   Polysubstance abuse ([place])    S/P mitral valve clip implantation 05/23/2021   s/p MitraClip NTW device A2/P2 by Dr Burt Knack   Shock liver    a. during 7-05/2018 adm for cardiac arrest and again in 11/2018.   Sleep apnea    Thrombocytopenia ([place]) 04/2018   Tobacco abuse    Ventricular fibrillation [facility])     Past Surgical History:  Procedure Laterality Date   ATHERECTOMY     BIOPSY  07/16/2018   Procedure: BIOPSY;  Surgeon: Gatha Mayer, MD;  Location: [facility] ENDOSCOPY;  Service: Endoscopy;;   CARDIAC CATHETERIZATION  05/22/2018   stent   COLONOSCOPY WITH PROPOFOL N/A 07/16/2018   Procedure: COLONOSCOPY WITH PROPOFOL;  Surgeon: Gatha Mayer, MD;  Location: [place];  Service: Endoscopy;  Laterality: N/A;   CORONARY ANGIOPLASTY     CORONARY STENT INTERVENTION N/A 05/22/2018   Procedure: CORONARY STENT INTERVENTION;  Surgeon: Lorretta Harp, MD;  Location: [place] CV LAB;  Service: Cardiovascular;  Laterality: N/A;   CORONARY/GRAFT ACUTE MI REVASCULARIZATION N/A 12/16/2018   Procedure: CORONARY/GRAFT ACUTE MI REVASCULARIZATION;  Surgeon: Troy Sine, MD;  Location: [place] CV LAB;  Service: Cardiovascular;  Laterality: N/A;   ENTEROSCOPY N/A 01/22/2019   Procedure: ENTEROSCOPY;  Surgeon: Thornton Park, MD;  Location: [place];  Service:  Gastroenterology;  Laterality: N/A;   ESOPHAGOGASTRODUODENOSCOPY (EGD) WITH PROPOFOL N/A 06/10/2018   Procedure: ESOPHAGOGASTRODUODENOSCOPY (EGD) WITH PROPOFOL;  Surgeon: Ladene Artist, MD;  Location[facility] ENDOSCOPY;  Service: Endoscopy;  Laterality: N/A;   ESOPHAGOGASTRODUODENOSCOPY (EGD) WITH PROPOFOL N/A 07/16/2018   Procedure: ESOPHAGOGASTRODUODENOSCOPY (EGD) WITH PROPOFOL;  Surgeon: Gatha Mayer, MD;  Location: [place];  Service: Endoscopy;  Laterality: N/A;   FEMORAL ARTERY - POPLITEAL ARTERY BYPASS GRAFT     GIVENS CAPSULE STUDY N/A 01/20/2019   Procedure: GIVENS CAPSULE STUDY;  Surgeon: Gatha Mayer, MD;  Location: [place];  Service: Endoscopy;  Laterality: N/A;   HOT HEMOSTASIS N/A 07/16/2018   Procedure: HOT HEMOSTASIS (ARGON PLASMA COAGULATION/BICAP);  Surgeon: Gatha Mayer, MD;  Location: [facility] ENDOSCOPY;  Service: Endoscopy;  Laterality: N/A;  HOT HEMOSTASIS N/A 01/22/2019   Procedure: HOT HEMOSTASIS (ARGON PLASMA COAGULATION/BICAP);  Surgeon: Thornton Park, MD;  Location: [place];  Service: Gastroenterology;  Laterality: N/A;   IABP INSERTION N/A 12/16/2018   Procedure: IABP Insertion;  Surgeon: Troy Sine, MD;  Location: [place] CV LAB;  Service: Cardiovascular;  Laterality: N/A;   IR FLUORO GUIDE CV LINE RIGHT  05/31/2018   IR REMOVAL TUN CV CATH W/O FL  06/21/2018   IR US GUIDE VASC ACCESS RIGHT  05/31/2018   LEFT HEART CATH AND CORONARY ANGIOGRAPHY N/A 05/22/2018   Procedure: LEFT HEART CATH AND CORONARY ANGIOGRAPHY;  Surgeon: Lorretta Harp, MD;  Location: [place] CV LAB;  Service: Cardiovascular;  Laterality: N/A;   LEFT HEART CATH AND CORONARY ANGIOGRAPHY N/A 12/16/2018   Procedure: LEFT HEART CATH AND CORONARY ANGIOGRAPHY;  Surgeon: Troy Sine, MD;  Location: [place] CV LAB;  Service: Cardiovascular;  Laterality: N/A;   MITRAL VALVE REPAIR N/A 05/23/2021   Procedure: MITRAL VALVE REPAIR;  Surgeon: Sherren Mocha, MD;  Location: [place] CV LAB;  Service: Cardiovascular;  Laterality: N/A;   RIGHT/LEFT HEART CATH AND CORONARY ANGIOGRAPHY N/A 02/27/2021   Procedure: RIGHT/LEFT HEART CATH AND CORONARY ANGIOGRAPHY;  Surgeon: Troy Sine, MD;  Location: [place] CV LAB;  Service: Cardiovascular;  Laterality: N/A;   TEE WITHOUT CARDIOVERSION N/A 02/27/2021   Procedure: TRANSESOPHAGEAL ECHOCARDIOGRAM (TEE);  Surgeon: Werner Lean, MD;  Location: [facility] ENDOSCOPY;  Service: Cardiovascular;  Laterality: N/A;   TEE WITHOUT CARDIOVERSION N/A 05/23/2021   Procedure: TRANSESOPHAGEAL ECHOCARDIOGRAM (TEE);  Surgeon: Sherren Mocha, MD;  Location: [place] CV LAB;  Service: Cardiovascular;  Laterality: N/A;    Current Medications: Current Meds  Medication Sig   amoxicillin (AMOXIL) 500 MG tablet Take 4 tablets (2000 mg) by mouth before any dental procedures   atorvastatin (LIPITOR) 40 MG tablet Take 1 tablet (40 mg total) by mouth daily.   carvedilol (COREG) 6.25 MG tablet Take 1 tablet (6.25 mg total) by mouth 2 (two) times daily.   clopidogrel (PLAVIX) 75 MG tablet Take 1 tablet (75 mg total) by mouth daily.   empagliflozin (JARDIANCE) 10 MG TABS tablet Take 1 tablet (10 mg total) by mouth daily before breakfast.   loratadine (CLARITIN) 10 MG tablet Take 10 mg by mouth daily as needed for allergies.   nitroGLYCERIN (NITROSTAT) 0.4 MG SL tablet Place 1 tablet (0.4 mg total) under the tongue every 5 (five) minutes as needed for chest pain (max 3 doses).   pantoprazole (PROTONIX) 40 MG tablet Take 1 tablet (40 mg total) by mouth daily.   Sodium Chloride-Sodium Bicarb (NETI POT SINUS [place] NA) Place 1 application into the nose once a week.   spironolactone (ALDACTONE) 25 MG tablet TAKE 1 TABLET (25 MG TOTAL) BY MOUTH DAILY.   [DISCONTINUED] carvedilol (COREG) 12.5 MG tablet Take 1 tablet (12.5 mg total) by mouth 2 (two) times daily with a meal.   [DISCONTINUED] sacubitril-valsartan (ENTRESTO) 24-26 MG Take 1 tablet by mouth  2 (two) times daily.     Allergies:   Patient has no known allergies.   Social History   Socioeconomic History   Marital status: Divorced    Spouse name: Not on file   Number of children: Not on file   Years of education: Not on file   Highest education level: Not on file  Occupational History    Employer: [employer]  Tobacco Use   Smoking status: Every Day  Packs/day: 0.50    Types: Cigarettes   Smokeless tobacco: Current  Vaping Use   Vaping Use: Never used  Substance and Sexual Activity   Alcohol use: Yes    Comment: none in past several weeks, but normally about 5 beers a week, endorses binge drinking at celebrations   Drug use: Yes    Types: Cocaine, Marijuana, Methamphetamines, Benzodiazepines    Comment: vaguely discusses useage, but quickly changes subject   Sexual activity: Yes    Partners: Female  Other Topics Concern   Not on file  Social History Narrative   Divorced   Employed at [employer] abuse   Social Determinants of Health   Financial Resource Strain: Not on file  Food Insecurity: Not on file  Transportation Needs: Not on file  Physical Activity: Not on file  Stress: Not on file  Social Connections: Not on file     Family History: The patient's family history includes Alzheimer's disease in his father.  ROS:   Please see the history of present illness.    All other systems reviewed and are negative.  EKGs/Labs/Other Studies Reviewed:    The following studies were reviewed today:  05/23/21 MITRAL VALVE REPAIR      Conclusion   Successful transcatheter edge to edge mitral valve repair with placement of a single MitraClip NTW device A2/P2, reducing MR from moderate to mild. Mean gradient increased from 1 mmHg to 7 mmHg post-Clip placement with a HR of approximately 80 bpm   Plan: ASA and plavix x 3 months, then continue ASA alone, beta blockade as tolerated to a HR of about 60 bpm.      Recommendations   Antiplatelet/Anticoag Recommend uninterrupted dual antiplatelet therapy with Aspirin '81mg'$  daily and Clopidogrel '75mg'$  daily for 3 months.     _____________    Echo 05/24/21: IMPRESSIONS   1. Anteroseptal and apical setpal hypokinesis. Mid-apical anterior and  apical akinesis.. Left ventricular ejection fraction, by estimation, is 30  to 35%. The left ventricle has moderately decreased function. The left  ventricle demonstrates regional wall   motion abnormalities (see scoring diagram/findings for description). The  left ventricular internal cavity size was moderately dilated. There is  mild asymmetric left ventricular hypertrophy of the posterior segment.  Left ventricular diastolic parameters  are consistent with Grade I diastolic dysfunction (impaired relaxation).   2. Right ventricular systolic function is normal. The right ventricular  size is normal. There is normal pulmonary artery systolic pressure.   3. Left atrial size was severely dilated.   4. Compared with the echo 07/2020, Mitra-Clip is now present. Mitral  regurgitation has improved from severe to mild. The mitral valve has been  repaired/replaced. Trivial mitral valve regurgitation. Mild mitral  stenosis. The mean mitral valve gradient is   6.0 mmHg. There is a Mitra-Clip present in the mitral position.   5. The aortic valve is normal in structure. There is mild calcification  of the aortic valve. Aortic valve regurgitation is mild to moderate. Mild  aortic valve stenosis. Aortic regurgitation PHT measures 498 msec. Aortic  valve area, by VTI measures 2.23  cm. Aortic valve mean gradient measures 11.0 mmHg. Aortic valve Vmax  measures 2.31 m/s.   6. The inferior vena cava is normal in size with greater than 50%  respiratory variability, suggesting right atrial pressure of 3 mmHg.     EKG:  EKG is NOT ordered today.  T  Recent Labs: 02/13/2021: Magnesium 2.1 04/04/2021: B Natriuretic  Peptide  285.9 05/21/2021: ALT 17 05/24/2021: BUN 18; Creatinine, Ser 1.20; Hemoglobin 9.4; Platelets 275; Potassium 4.5; Sodium 136  Recent Lipid Panel    Component Value Date/Time   CHOL 124 02/13/2021 1637   TRIG 42 02/13/2021 1637   HDL 36 (L) 02/13/2021 1637   CHOLHDL 3.4 02/13/2021 1637   CHOLHDL 3.6 12/17/2018 0237   VLDL 14 12/17/2018 0237   LDLCALC 78 02/13/2021 1637     Risk Assessment/Calculations:       Physical Exam:    VS:  BP (!) 80/50   Pulse 84   Ht '5\' 9"'$  (1.753 m)   Wt 166 lb 12.8 oz (75.7 kg)   SpO2 98%   BMI 24.63 kg/m     GEN:  Well nourished, well developed in no acute distress HEENT: Normal NECK: No JVD; No carotid bruits LYMPHATICS: No lymphadenopathy CARDIAC: RRR, very soft murmur at apex. No rubs, gallops RESPIRATORY:  Clear to auscultation without rales, wheezing or rhonchi  ABDOMEN: Soft, non-tender, non-distended MUSCULOSKELETAL:  No edema; No deformity  SKIN: Warm and dry.  Groin sitesclear without hematoma or ecchymosis  NEUROLOGIC:  Alert and oriented x 3 PSYCHIATRIC:  Normal affect   ASSESSMENT:    1. S/P mitral valve clip implantation   2. Chronic systolic congestive heart failure ([place])   3. Essential hypertension   4. PAD (peripheral artery disease) (HCC)    PLAN:    In order of problems listed above:  Severe MR s/p TEER: doing well 1 week out from Mitraclip. Groin site healing well. Continue on plavix alone given intolerance to aspirin. SBE prophylaxis discussed; I have RX'd amoxicillin. Given mildly elevated gradients after clip we increased Coreg but he has symptomatic hypotension today so will decrease that back down from 12.'5mg'$  BID to 6.25 mg BID.  Chronic systolic CHF: pt was noted to be dry during admission and torsemide was discontinued at discharge. He was continued on Coreg, Mearl Latin and Jardiance. He is hypotensive today with dizziness and fatigue. Will decrease coreg and hold Entresto. BMET today  HTN: as above, BP  80/50 and feeling dizzy and weak. Will hold Entresto and decrease Coreg. He will keep a log of his pressures and I will see him back next week for close follow up.   PAD: continue medical therapy      Medication Adjustments/Labs and Tests Ordered: Current medicines are reviewed at length with the patient today.  Concerns regarding medicines are outlined above.  Orders Placed This Encounter  Procedures   Basic metabolic panel    Meds ordered this encounter  Medications   carvedilol (COREG) 6.25 MG tablet    Sig: Take 1 tablet (6.25 mg total) by mouth 2 (two) times daily.    Dispense:  180 tablet    Refill:  3    Dose decrease; keep on file until next refill is due   amoxicillin (AMOXIL) 500 MG tablet    Sig: Take 4 tablets (2000 mg) by mouth before any dental procedures    Dispense:  12 tablet    Refill:  3     Patient Instructions  Medication Instructions:  Your physician has recommended you make the following change in your medication:  1.) stop Entresto (sacubitril-valsartan) 2.) decrease Coreg (carvedilol) to 6.25 mg twice daily 3.) start amoxicillin 500 mg tabs --take FOUR tablets (2000 mg) ONE HOUR BEFORE ANY DENTAL APPOINTMENTS  *If you need a refill on your cardiac medications before your next appointment, please call your pharmacy*  Lab Work: Today: BMET  If you have labs (blood work) drawn today and your tests are completely normal, you will receive your results only by: [employer] (if you have MyChart) OR A paper copy in the mail If you have any lab test that is abnormal or we need to change your treatment, we will call you to review the results.   Testing/Procedures: none   Follow-Up: As planned  see below.  Other Instructions     Signed, Angelena Form, PA-C  05/30/2021 1:38 PM     [facility]

## 2021-05-30 ENCOUNTER — Ambulatory Visit: Payer: No Typology Code available for payment source | Admitting: Physician Assistant

## 2021-05-30 ENCOUNTER — Other Ambulatory Visit: Payer: Self-pay

## 2021-05-30 ENCOUNTER — Encounter: Payer: Self-pay | Admitting: Physician Assistant

## 2021-05-30 VITALS — BP 80/50 | HR 84 | Ht 69.0 in | Wt 166.8 lb

## 2021-05-30 DIAGNOSIS — I10 Essential (primary) hypertension: Secondary | ICD-10-CM

## 2021-05-30 DIAGNOSIS — I5022 Chronic systolic (congestive) heart failure: Secondary | ICD-10-CM

## 2021-05-30 DIAGNOSIS — Z9889 Other specified postprocedural states: Secondary | ICD-10-CM | POA: Diagnosis not present

## 2021-05-30 DIAGNOSIS — Z95818 Presence of other cardiac implants and grafts: Secondary | ICD-10-CM

## 2021-05-30 DIAGNOSIS — I739 Peripheral vascular disease, unspecified: Secondary | ICD-10-CM | POA: Diagnosis not present

## 2021-05-30 MED ORDER — AMOXICILLIN 500 MG PO TABS: ORAL_TABLET | ORAL | 3 refills | Status: DC

## 2021-05-30 MED ORDER — CARVEDILOL 6.25 MG PO TABS: 6.2500 mg | ORAL_TABLET | Freq: Two times a day (BID) | ORAL | 3 refills | Status: DC

## 2021-05-30 NOTE — Patient Instructions (Addendum)
Medication Instructions:  Your physician has recommended you make the following change in your medication:  1.) stop Entresto (sacubitril-valsartan) 2.) decrease Coreg (carvedilol) to 6.25 mg twice daily 3.) start amoxicillin 500 mg tabs --take FOUR tablets (2000 mg) ONE HOUR BEFORE ANY DENTAL APPOINTMENTS  *If you need a refill on your cardiac medications before your next appointment, please call your pharmacy*   Lab Work: Today: BMET  If you have labs (blood work) drawn today and your tests are completely normal, you will receive your results only by: [place] (if you have MyChart) OR A paper copy in the mail If you have any lab test that is abnormal or we need to change your treatment, we will call you to review the results.   Testing/Procedures: none   Follow-Up: As planned  see below.  Other Instructions

## 2021-05-31 LAB — BASIC METABOLIC PANEL
BUN/Creatinine Ratio: 12 (ref 9–20)
BUN: 13 mg/dL (ref 6–24)
CO2: 20 mmol/L (ref 20–29)
Calcium: 9.3 mg/dL (ref 8.7–10.2)
Chloride: 105 mmol/L (ref 96–106)
Creatinine, Ser: 1.13 mg/dL (ref 0.76–1.27)
Glucose: 98 mg/dL (ref 65–99)
Potassium: 4.5 mmol/L (ref 3.5–5.2)
Sodium: 139 mmol/L (ref 134–144)
eGFR: 77 mL/min/{1.73_m2} (ref >59)

## 2021-06-05 ENCOUNTER — Ambulatory Visit: Payer: No Typology Code available for payment source | Admitting: Physician Assistant

## 2021-06-05 ENCOUNTER — Other Ambulatory Visit: Payer: Self-pay

## 2021-06-05 ENCOUNTER — Encounter: Payer: Self-pay | Admitting: Physician Assistant

## 2021-06-05 VITALS — BP 102/60 | HR 88 | Ht 69.0 in | Wt 165.8 lb

## 2021-06-05 DIAGNOSIS — I10 Essential (primary) hypertension: Secondary | ICD-10-CM | POA: Diagnosis not present

## 2021-06-05 DIAGNOSIS — I739 Peripheral vascular disease, unspecified: Secondary | ICD-10-CM | POA: Diagnosis not present

## 2021-06-05 DIAGNOSIS — Z95818 Presence of other cardiac implants and grafts: Secondary | ICD-10-CM

## 2021-06-05 DIAGNOSIS — Z9889 Other specified postprocedural states: Secondary | ICD-10-CM | POA: Diagnosis not present

## 2021-06-05 DIAGNOSIS — I5022 Chronic systolic (congestive) heart failure: Secondary | ICD-10-CM

## 2021-06-05 MED ORDER — SACUBITRIL-VALSARTAN 24-26 MG PO TABS: 1.0000 | ORAL_TABLET | Freq: Two times a day (BID) | ORAL | 6 refills | Status: DC

## 2021-06-05 MED ORDER — METOPROLOL SUCCINATE ER 25 MG PO TB24: 25.0000 mg | ORAL_TABLET | Freq: Every day | ORAL | 3 refills | Status: DC

## 2021-06-05 MED ORDER — SPIRONOLACTONE 25 MG PO TABS: 12.5000 mg | ORAL_TABLET | Freq: Every day | ORAL | 3 refills | Status: DC

## 2021-06-05 NOTE — Patient Instructions (Addendum)
Medication Instructions:  Your physician has recommended you make the following change in your medication:  1.) stop carvedilol 2.) start metoprolol succinate (Toprol XL) 25 mg - one tablet daily 3.) decrease spironolactone to 12.5 mg - one tablet daily 4.) restart Entresto 24-26 mg - one tablet twice a day  Take the 30 day free trial card w you to pick up Entresto.  Then, enroll for the $10 co pay offer  *If you need a refill on your cardiac medications before your next appointment, please call your pharmacy*   Lab Work: none If you have labs (blood work) drawn today and your tests are completely normal, you will receive your results only by: [place] (if you have MyChart) OR A paper copy in the mail If you have any lab test that is abnormal or we need to change your treatment, we will call you to review the results.   Testing/Procedures: none   Follow-Up: As planned  Other Instructions

## 2021-06-05 NOTE — Progress Notes (Addendum)
Howard Thompson                                     Cardiology Office Note:    Date:  06/05/2021   ID:  Howard Thompson, DOB 1966/02/05, MRN [id number]  PCP:  Howard Geralds, MD  [facility] Cardiologist:  Howard Majestic, MD  Howard Thompson Electrophysiologist:  None   Referring MD: Howard Thompson, *   Follow up BP - medication adjustments  History of Present Illness:    Howard Thompson is a 55 y.o. male with a hx of HTN, HLD, CAD, ischemic cardiomyopathy, myasthenia gravis and had a thymectomy by sternotomy, PAD, smoking, polysubstance abuse, moderate AI and severe non rheumatic mitral regurgitation s/p TEER (05/23/21) who presents to clinic for follow up.   Patient had left femoral endarterectomy in 2016 and later left femoral artery bypass in 2017 at [facility].  In 2019, he had anterior STEMI complicated by cardiac arrest.  EF by echo at this time was 25-30%.  Hospitalization was complicated by GI bleeding and AKI requiring temporary HD.  He had DES to proximal LAD. In 2/20, he had recurrent anterior STEMI with totally occluded proximal LAD treated with aspiration thrombectomy and PCI.     He additionally has myasthenia gravis and had a thymectomy in the past (required sternotomy). He has a history of cocaine, amphetamine, and marijuana abuse (none x 2 years). He is an active smoker.     TTE showed significant MR recently, so TEE was done in 5/22. This showed LV EF 25-30%, moderate RV enlargement with mildly decreased RV systolic function, severe MR with ERO 0.65.  Right and left heart cath in 5/22 showed elevated PCWP with prominent V waves, preserved cardiac output, patent LAD stents.  Patient has seen Dr. Burt Thompson regarding Mitraclip placement. He reported exertional dyspnea with moderate activity such as walking up a flight of stairs as well as significant generalized fatigue.  The patient was referred for advanced heart failure  consultation and was seen by Dr. Aundra Thompson April 04, 2021.  He was on a good medical program already established with carvedilol, Entresto, spironolactone, and torsemide.  Howard Thompson was added to his regimen.  He was felt to be an appropriate candidate for transcatheter edge-to-edge mitral valve repair.   He was evaluated by the multidisciplinary valve team and underwent successful TEER with a single MitraClip NTW device A2/P2, reducing MR from moderate to mild. Mean gradient increased from 1 mmHg to 7 mmHg post-Clip placement with a HR of approximately 80 bpm. Coreg was increased from 6.'25mg'$  BID to 12.'5mg'$  BID. Post operative echo showed EF 30-35%, mild residual MR with mild MS  (mean gradient of 6 mm Hg). He was continued on home Plavix '75mg'$  daily without additional aspirin therapy as the patient told me he has had issues with bleeding on aspirin in the past. The patient appeared euvolemic but slightly dry since addition of Jardiance when seen by Dr. Aundra Thompson. He was continued on home meds Coreg (increased from 6.'25mg'$  BID to 12.5 mg BID given mildly elevated mean gradients after clip), Entresto 24-'26mg'$  daily, spiro '25mg'$  daily, and Jardiance '10mg'$  daily, but torsemide '20mg'$  daily was discontinued at discharge. Discharge weight 166lbs.  At his 1 week post hospital visit, he had symptomatic hypotension and his Howard Thompson was held and Coreg backed back down from 12.'5mg'$  BID to 6.'25mg'$  BID. Creat  was stable at 1.13.  Today the patient presents to clinic for follow up. He is doing well. Here with friend. Dizziness and fatigue are better. BPs have been much better. No CP or SOB. No LE edema, orthopnea or PND. No dizziness or syncope. No blood in stool or urine. No palpitations.    Past Medical History:  Diagnosis Date   Acute renal failure ([place])    a. during 7-05/2018 adm for cardiac arrest, requiring HD temporarily.   Alcohol abuse    Anemia    AVM (arteriovenous malformation) of colon    Cardiac arrest ([place]) 05/22/2018    Vfib   CHF (congestive heart failure) (HCC)    Cocaine abuse (HCC)    COPD (chronic obstructive pulmonary disease) (HCC)    Coronary artery disease    a. hx of POBA in [place]. b. out-of-hospital cardiac arrest 04/2018 (vfib/STEMI) s/p DES to prox LAD (complex admission with GIB, renal failure requiring HD, encephalopathy). c. STEMI 11/2018 after pt stopped Plavix - s/p DES to LAD.   Duodenitis    GERD (gastroesophageal reflux disease)    GI bleed    History of blood transfusion    Hyperlipidemia    Iron deficiency anemia    Ischemic cardiomyopathy    a. EF <25% at time of cath/arrest 04/2018, improved to 40-45% by f/u echo 06/09/18.   Myasthenia gravis ([place])    Myocardial infarction ([place]) 05/22/2018   STEMI   PAD (peripheral artery disease) (HCC)    a. femoral bypass   Polysubstance abuse ([place])    S/P mitral valve clip implantation 05/23/2021   s/p MitraClip NTW device A2/P2 by Dr Howard Thompson   Shock liver    a. during 7-05/2018 adm for cardiac arrest and again in 11/2018.   Sleep apnea    Thrombocytopenia ([place]) 04/2018   Tobacco abuse    Ventricular fibrillation [facility])     Past Surgical History:  Procedure Laterality Date   ATHERECTOMY     BIOPSY  07/16/2018   Procedure: BIOPSY;  Surgeon: Gatha Mayer, MD;  Location: [facility] ENDOSCOPY;  Service: Endoscopy;;   CARDIAC CATHETERIZATION  05/22/2018   stent   COLONOSCOPY WITH PROPOFOL N/A 07/16/2018   Procedure: COLONOSCOPY WITH PROPOFOL;  Surgeon: Gatha Mayer, MD;  Location: [place];  Service: Endoscopy;  Laterality: N/A;   CORONARY ANGIOPLASTY     CORONARY STENT INTERVENTION N/A 05/22/2018   Procedure: CORONARY STENT INTERVENTION;  Surgeon: Lorretta Harp, MD;  Location: [place] CV LAB;  Service: Cardiovascular;  Laterality: N/A;   CORONARY/GRAFT ACUTE MI REVASCULARIZATION N/A 12/16/2018   Procedure: CORONARY/GRAFT ACUTE MI REVASCULARIZATION;  Surgeon: Troy Sine, MD;  Location: [place] CV LAB;  Service: Cardiovascular;   Laterality: N/A;   ENTEROSCOPY N/A 01/22/2019   Procedure: ENTEROSCOPY;  Surgeon: Thornton Park, MD;  Location: [place];  Service: Gastroenterology;  Laterality: N/A;   ESOPHAGOGASTRODUODENOSCOPY (EGD) WITH PROPOFOL N/A 06/10/2018   Procedure: ESOPHAGOGASTRODUODENOSCOPY (EGD) WITH PROPOFOL;  Surgeon: Ladene Artist, MD;  Location: [facility] ENDOSCOPY;  Service: Endoscopy;  Laterality: N/A;   ESOPHAGOGASTRODUODENOSCOPY (EGD) WITH PROPOFOL N/A 07/16/2018   Procedure: ESOPHAGOGASTRODUODENOSCOPY (EGD) WITH PROPOFOL;  Surgeon: Gatha Mayer, MD;  Location: [place];  Service: Endoscopy;  Laterality: N/A;   FEMORAL ARTERY - POPLITEAL ARTERY BYPASS GRAFT     GIVENS CAPSULE STUDY N/A 01/20/2019   Procedure: GIVENS CAPSULE STUDY;  Surgeon: Gatha Mayer, MD;  Location: [place];  Service: Endoscopy;  Laterality: N/A;   HOT HEMOSTASIS N/A  07/16/2018   Procedure: HOT HEMOSTASIS (ARGON PLASMA COAGULATION/BICAP);  Surgeon: Gatha Mayer, MD;  Location: [facility] ENDOSCOPY;  Service: Endoscopy;  Laterality: N/A;   HOT HEMOSTASIS N/A 01/22/2019   Procedure: HOT HEMOSTASIS (ARGON PLASMA COAGULATION/BICAP);  Surgeon: Thornton Park, MD;  Location: [place];  Service: Gastroenterology;  Laterality: N/A;   IABP INSERTION N/A 12/16/2018   Procedure: IABP Insertion;  Surgeon: Troy Sine, MD;  Location: [place] CV LAB;  Service: Cardiovascular;  Laterality: N/A;   IR FLUORO GUIDE CV LINE RIGHT  05/31/2018   IR REMOVAL TUN CV CATH W/O FL  06/21/2018   IR US GUIDE VASC ACCESS RIGHT  05/31/2018   LEFT HEART CATH AND CORONARY ANGIOGRAPHY N/A 05/22/2018   Procedure: LEFT HEART CATH AND CORONARY ANGIOGRAPHY;  Surgeon: Lorretta Harp, MD;  Location: [place] CV LAB;  Service: Cardiovascular;  Laterality: N/A;   LEFT HEART CATH AND CORONARY ANGIOGRAPHY N/A 12/16/2018   Procedure: LEFT HEART CATH AND CORONARY ANGIOGRAPHY;  Surgeon: Troy Sine, MD;  Location: [place] CV LAB;  Service:  Cardiovascular;  Laterality: N/A;   MITRAL VALVE REPAIR N/A 05/23/2021   Procedure: MITRAL VALVE REPAIR;  Surgeon: Sherren Mocha, MD;  Location: [place] CV LAB;  Service: Cardiovascular;  Laterality: N/A;   RIGHT/LEFT HEART CATH AND CORONARY ANGIOGRAPHY N/A 02/27/2021   Procedure: RIGHT/LEFT HEART CATH AND CORONARY ANGIOGRAPHY;  Surgeon: Troy Sine, MD;  Location: [place] CV LAB;  Service: Cardiovascular;  Laterality: N/A;   TEE WITHOUT CARDIOVERSION N/A 02/27/2021   Procedure: TRANSESOPHAGEAL ECHOCARDIOGRAM (TEE);  Surgeon: Werner Lean, MD;  Location: [facility] ENDOSCOPY;  Service: Cardiovascular;  Laterality: N/A;   TEE WITHOUT CARDIOVERSION N/A 05/23/2021   Procedure: TRANSESOPHAGEAL ECHOCARDIOGRAM (TEE);  Surgeon: Sherren Mocha, MD;  Location: [place] CV LAB;  Service: Cardiovascular;  Laterality: N/A;    Current Medications: Current Meds  Medication Sig   amoxicillin (AMOXIL) 500 MG tablet Take 4 tablets (2000 mg) by mouth before any dental procedures   atorvastatin (LIPITOR) 40 MG tablet Take 1 tablet (40 mg total) by mouth daily.   clopidogrel (PLAVIX) 75 MG tablet Take 1 tablet (75 mg total) by mouth daily.   empagliflozin (JARDIANCE) 10 MG TABS tablet Take 1 tablet (10 mg total) by mouth daily before breakfast.   loratadine (CLARITIN) 10 MG tablet Take 10 mg by mouth daily as needed for allergies.   nitroGLYCERIN (NITROSTAT) 0.4 MG SL tablet Place 1 tablet (0.4 mg total) under the tongue every 5 (five) minutes as needed for chest pain (max 3 doses).   pantoprazole (PROTONIX) 40 MG tablet Take 1 tablet (40 mg total) by mouth daily.   Sodium Chloride-Sodium Bicarb (NETI POT SINUS [place] NA) Place 1 application into the nose once a week.   spironolactone (ALDACTONE) 25 MG tablet TAKE 1 TABLET (25 MG TOTAL) BY MOUTH DAILY.   [DISCONTINUED] carvedilol (COREG) 6.25 MG tablet Take 1 tablet (6.25 mg total) by mouth 2 (two) times daily.     Allergies:   Patient has no  known allergies.   Social History   Socioeconomic History   Marital status: Divorced    Spouse name: Not on file   Number of children: Not on file   Years of education: Not on file   Highest education level: Not on file  Occupational History    Employer: [employer]  Tobacco Use   Smoking status: Every Day    Packs/day: 0.50    Types: Cigarettes  Smokeless tobacco: Current  Vaping Use   Vaping Use: Never used  Substance and Sexual Activity   Alcohol use: Yes    Comment: none in past several weeks, but normally about 5 beers a week, endorses binge drinking at celebrations   Drug use: Yes    Types: Cocaine, Marijuana, Methamphetamines, Benzodiazepines    Comment: vaguely discusses useage, but quickly changes subject   Sexual activity: Yes    Partners: Female  Other Topics Concern   Not on file  Social History Narrative   Divorced   Employed at [employer] abuse   Social Determinants of Health   Financial Resource Strain: Not on file  Food Insecurity: Not on file  Transportation Needs: Not on file  Physical Activity: Not on file  Stress: Not on file  Social Connections: Not on file     Family History: The patient's family history includes Alzheimer's disease in his father.  ROS:   Please see the history of present illness.    All other systems reviewed and are negative.  EKGs/Labs/Other Studies Reviewed:    The following studies were reviewed today:  05/23/21 MITRAL VALVE REPAIR      Conclusion   Successful transcatheter edge to edge mitral valve repair with placement of a single MitraClip NTW device A2/P2, reducing MR from moderate to mild. Mean gradient increased from 1 mmHg to 7 mmHg post-Clip placement with a HR of approximately 80 bpm   Plan: ASA and plavix x 3 months, then continue ASA alone, beta blockade as tolerated to a HR of about 60 bpm.     Recommendations   Antiplatelet/Anticoag Recommend  uninterrupted dual antiplatelet therapy with Aspirin '81mg'$  daily and Clopidogrel '75mg'$  daily for 3 months.     _____________    Echo 05/24/21: IMPRESSIONS   1. Anteroseptal and apical setpal hypokinesis. Mid-apical anterior and  apical akinesis.. Left ventricular ejection fraction, by estimation, is 30  to 35%. The left ventricle has moderately decreased function. The left  ventricle demonstrates regional wall   motion abnormalities (see scoring diagram/findings for description). The  left ventricular internal cavity size was moderately dilated. There is  mild asymmetric left ventricular hypertrophy of the posterior segment.  Left ventricular diastolic parameters  are consistent with Grade I diastolic dysfunction (impaired relaxation).   2. Right ventricular systolic function is normal. The right ventricular  size is normal. There is normal pulmonary artery systolic pressure.   3. Left atrial size was severely dilated.   4. Compared with the echo 07/2020, Mitra-Clip is now present. Mitral  regurgitation has improved from severe to mild. The mitral valve has been  repaired/replaced. Trivial mitral valve regurgitation. Mild mitral  stenosis. The mean mitral valve gradient is   6.0 mmHg. There is a Mitra-Clip present in the mitral position.   5. The aortic valve is normal in structure. There is mild calcification  of the aortic valve. Aortic valve regurgitation is mild to moderate. Mild  aortic valve stenosis. Aortic regurgitation PHT measures 498 msec. Aortic  valve area, by VTI measures 2.23  cm. Aortic valve mean gradient measures 11.0 mmHg. Aortic valve Vmax  measures 2.31 m/s.   6. The inferior vena cava is normal in size with greater than 50%  respiratory variability, suggesting right atrial pressure of 3 mmHg.     EKG:  EKG is NOT ordered today.    Recent Labs: 02/13/2021: Magnesium 2.1 04/04/2021: B Natriuretic Peptide 285.9 05/21/2021: ALT 17 05/24/2021: Hemoglobin 9.4;  Platelets 275 05/30/2021: BUN 13; Creatinine, Ser 1.13; Potassium 4.5; Sodium 139  Recent Lipid Panel    Component Value Date/Time   CHOL 124 02/13/2021 1637   TRIG 42 02/13/2021 1637   HDL 36 (L) 02/13/2021 1637   CHOLHDL 3.4 02/13/2021 1637   CHOLHDL 3.6 12/17/2018 0237   VLDL 14 12/17/2018 0237   LDLCALC 78 02/13/2021 1637     Risk Assessment/Calculations:       Physical Exam:    VS:  BP 102/60   Pulse 88   Ht '5\' 9"'$  (1.753 m)   Wt 165 lb 12.8 oz (75.2 kg)   SpO2 99%   BMI 24.48 kg/m     GEN:  Well nourished, well developed in no acute distress HEENT: Normal NECK: No JVD; No carotid bruits LYMPHATICS: No lymphadenopathy CARDIAC: RRR, 2/6 flow murmur at apex. No rubs, gallops RESPIRATORY:  Clear to auscultation without rales, wheezing or rhonchi  ABDOMEN: Soft, non-tender, non-distended MUSCULOSKELETAL:  No edema; No deformity  SKIN: Warm and dry.  NEUROLOGIC:  Alert and oriented x 3 PSYCHIATRIC:  Normal affect   ASSESSMENT:    1. S/P mitral valve clip implantation   2. Chronic systolic congestive heart failure ([place])   3. Essential hypertension   4. PAD (peripheral artery disease) (HCC)     PLAN:    In order of problems listed above:  Severe MR s/p TEER: doing well. Continue on plavix alone given intolerance to aspirin. SBE prophylaxis discussed; he has amoxicillin. Given mildly elevated gradients after clip we increased Coreg but he had symptomatic hypotension last visit and this was decreased back to 6.'25mg'$  BID. Will change him to Toprol XL '25mg'$  daily for better HR control and less BP control given borderline pressure today.   Chronic systolic CHF: appears euvolemic. EF 30-35%. CHF regimen was Coreg 12.'5mg'$  BID, Entresto 24-'26mg'$  BID, Spiro '25mg'$  daily and Jardiance '10mg'$  daily. Given symptomatic hypotension last visit, Coreg decreased back to 6.'25mg'$  BID and I held [place]. Follow up labs were okay. Today he presents and BP 102/60 mm hg. He says it's been a little  higher at home. Will change Coreg to Toprol XL '25mg'$  daily and cut spiro to 12.'5mg'$  daily and add back Entresto. This is quite expensive for him and I have provided samples and copay cards for him today.    HTN: being treated in the context of CHF. As above, Bps have been low but he is asymptomatic.   PAD: continue medical therapy     Medication Adjustments/Labs and Tests Ordered: Current medicines are reviewed at length with the patient today.  Concerns regarding medicines are outlined above.  No orders of the defined types were placed in this encounter.   No orders of the defined types were placed in this encounter.    There are no Patient Instructions on file for this visit.   Signed, Angelena Form, PA-C  06/05/2021 3:03 PM    [facility]

## 2021-06-17 ENCOUNTER — Encounter (HOSPITAL_COMMUNITY): Payer: No Typology Code available for payment source | Admitting: Cardiology

## 2021-06-17 ENCOUNTER — Ambulatory Visit (HOSPITAL_COMMUNITY)
Admission: RE | Admit: 2021-06-17 | Discharge: 2021-06-17 | Disposition: A | Discharge status: Home / Self Care | Payer: No Typology Code available for payment source | Source: Ambulatory Visit | Attending: Physician Assistant | Admitting: Physician Assistant

## 2021-06-17 ENCOUNTER — Other Ambulatory Visit: Payer: Self-pay

## 2021-06-17 DIAGNOSIS — I10 Essential (primary) hypertension: Secondary | ICD-10-CM | POA: Insufficient documentation

## 2021-06-17 DIAGNOSIS — G4730 Sleep apnea, unspecified: Secondary | ICD-10-CM | POA: Diagnosis not present

## 2021-06-17 DIAGNOSIS — I700 Atherosclerosis of aorta: Secondary | ICD-10-CM | POA: Insufficient documentation

## 2021-06-17 DIAGNOSIS — I340 Nonrheumatic mitral (valve) insufficiency: Secondary | ICD-10-CM

## 2021-06-17 DIAGNOSIS — J449 Chronic obstructive pulmonary disease, unspecified: Secondary | ICD-10-CM | POA: Diagnosis not present

## 2021-06-17 DIAGNOSIS — E785 Hyperlipidemia, unspecified: Secondary | ICD-10-CM | POA: Diagnosis not present

## 2021-06-17 DIAGNOSIS — I080 Rheumatic disorders of both mitral and aortic valves: Secondary | ICD-10-CM | POA: Diagnosis not present

## 2021-06-18 LAB — ECHOCARDIOGRAM COMPLETE
AR max vel: 2.16 cm2
AV Area VTI: 2.59 cm2
AV Area mean vel: 2.34 cm2
AV Mean grad: 12 mmHg
AV Peak grad: 19.9 mmHg
Ao pk vel: 2.23 m/s
Area-P 1/2: 2.45 cm2
MV VTI: 2.47 cm2
P 1/2 time: 683 msec
S' Lateral: 4.4 cm

## 2021-07-04 ENCOUNTER — Telehealth: Payer: Self-pay | Admitting: Physician Assistant

## 2021-07-04 NOTE — Telephone Encounter (Signed)
  [place] VALVE TEAM  Patient showed up for 1 month echo s/p MitraClip but missed apt with Dr. Aundra Dubin. I have made several attempts to reach the patient by phone and Mychart message but not able to reach him. Will send him a letter asking him to call the office back so that we can fill out a K[facility]Q and assess his NYHA class for our registry.   Angelena Form PA-C  [facility]

## 2021-08-01 ENCOUNTER — Other Ambulatory Visit: Payer: Self-pay

## 2021-08-01 ENCOUNTER — Encounter (HOSPITAL_COMMUNITY): Payer: Self-pay | Admitting: Cardiology

## 2021-08-01 ENCOUNTER — Ambulatory Visit (HOSPITAL_COMMUNITY)
Admission: RE | Admit: 2021-08-01 | Discharge: 2021-08-01 | Disposition: A | Discharge status: Home / Self Care | Payer: No Typology Code available for payment source | Source: Ambulatory Visit | Attending: Cardiology | Admitting: Cardiology

## 2021-08-01 VITALS — BP 128/70 | HR 63 | Wt 165.4 lb

## 2021-08-01 DIAGNOSIS — F17210 Nicotine dependence, cigarettes, uncomplicated: Secondary | ICD-10-CM | POA: Diagnosis not present

## 2021-08-01 DIAGNOSIS — I739 Peripheral vascular disease, unspecified: Secondary | ICD-10-CM | POA: Insufficient documentation

## 2021-08-01 DIAGNOSIS — G7000 Myasthenia gravis without (acute) exacerbation: Secondary | ICD-10-CM | POA: Diagnosis not present

## 2021-08-01 DIAGNOSIS — I110 Hypertensive heart disease with heart failure: Secondary | ICD-10-CM | POA: Insufficient documentation

## 2021-08-01 DIAGNOSIS — I2510 Atherosclerotic heart disease of native coronary artery without angina pectoris: Secondary | ICD-10-CM | POA: Insufficient documentation

## 2021-08-01 DIAGNOSIS — Z7902 Long term (current) use of antithrombotics/antiplatelets: Secondary | ICD-10-CM | POA: Insufficient documentation

## 2021-08-01 DIAGNOSIS — G4733 Obstructive sleep apnea (adult) (pediatric): Secondary | ICD-10-CM | POA: Diagnosis not present

## 2021-08-01 DIAGNOSIS — I252 Old myocardial infarction: Secondary | ICD-10-CM | POA: Insufficient documentation

## 2021-08-01 DIAGNOSIS — I340 Nonrheumatic mitral (valve) insufficiency: Secondary | ICD-10-CM | POA: Insufficient documentation

## 2021-08-01 DIAGNOSIS — Z955 Presence of coronary angioplasty implant and graft: Secondary | ICD-10-CM | POA: Insufficient documentation

## 2021-08-01 DIAGNOSIS — I255 Ischemic cardiomyopathy: Secondary | ICD-10-CM | POA: Insufficient documentation

## 2021-08-01 DIAGNOSIS — Z79899 Other long term (current) drug therapy: Secondary | ICD-10-CM | POA: Insufficient documentation

## 2021-08-01 DIAGNOSIS — I5022 Chronic systolic (congestive) heart failure: Secondary | ICD-10-CM | POA: Diagnosis present

## 2021-08-01 LAB — BASIC METABOLIC PANEL
Anion gap: 10 (ref 5–15)
BUN: 11 mg/dL (ref 6–20)
CO2: 23 mmol/L (ref 22–32)
Calcium: 9.6 mg/dL (ref 8.9–10.3)
Chloride: 102 mmol/L (ref 98–111)
Creatinine, Ser: 1.07 mg/dL (ref 0.61–1.24)
GFR, Estimated: >60 mL/min (ref >60)
Glucose, Bld: 102 mg/dL — ABNORMAL HIGH (ref 70–99)
Potassium: 4.3 mmol/L (ref 3.5–5.1)
Sodium: 135 mmol/L (ref 135–145)

## 2021-08-01 LAB — BRAIN NATRIURETIC PEPTIDE: B Natriuretic Peptide: 202.9 pg/mL — ABNORMAL HIGH (ref 0.0–100.0)

## 2021-08-01 MED ORDER — SPIRONOLACTONE 25 MG PO TABS: 25.0000 mg | ORAL_TABLET | Freq: Every day | ORAL | 3 refills | Status: DC

## 2021-08-01 MED ORDER — METOPROLOL SUCCINATE ER 25 MG PO TB24: 25.0000 mg | ORAL_TABLET | Freq: Two times a day (BID) | ORAL | 3 refills | Status: DC

## 2021-08-01 NOTE — Patient Instructions (Signed)
Increase Metoprolol XL to 25 mg Twice daily   Increase Spironolactone to 25 mg (1 tab) Daily  Labs done today, your results will be available in MyChart, we will contact you for abnormal readings.  Your physician recommends that you return for lab work in: 2 weeks  You have been referred to EP to discuss getting a defibrillator, they will call you for an appointment  Your physician recommends that you schedule a follow-up appointment in: 3 months  If you have any questions or concerns before your next appointment please send [place] a message through [place] or call our office at [phone].    TO LEAVE A MESSAGE FOR THE NURSE SELECT OPTION 2, PLEASE LEAVE A MESSAGE INCLUDING: YOUR NAME DATE OF BIRTH CALL BACK NUMBER REASON FOR CALL**this is important as we prioritize the call backs  YOU WILL RECEIVE A CALL BACK THE SAME DAY AS LONG AS YOU CALL BEFORE 4:00 PM  At the [facility], you and your health needs are our priority. As part of our continuing mission to provide you with exceptional heart care, we have created designated Provider Care Teams. These Care Teams include your primary Cardiologist (physician) and Advanced Practice Providers (APPs- Physician Assistants and Nurse Practitioners) who all work together to provide you with the care you need, when you need it.   You may see any of the following providers on your designated Care Team at your next follow up: Dr Glori Bickers Dr Loralie Champagne Dr Patrice Paradise, NP Lyda Jester, [place] Ginnie Smart Audry Riles, PharmD   Please be sure to bring in all your medications bottles to every appointment.

## 2021-08-04 NOTE — Progress Notes (Signed)
PCP: Jamesetta Geralds, MD Cardiology: Dr. Claiborne Billings HF Cardiology: Dr. Aundra Dubin  55 y.o. with history of CAD, ischemic cardiomyopathy, mitral regurgitation, PAD, smoking, polysubstance abuse was referred by Dr. Burt Knack for CHF clinic evaluation as part of Mitraclip workup.  Patient had left femoral endarterectomy in 2016 and later left femoral artery bypass in 2017 at [facility].  In 2019, he had anterior STEMI complicated by cardiac arrest.  EF by echo at this time was 25-30%.  Hospitalization was complicated by GI bleeding and AKI requiring temporary HD.  He had DES to proximal LAD. In 2/20, he had recurrent anterior STEMI with totally occluded proximal LAD treated with aspiration thrombectomy and PCI.    He additionally has myasthenia gravis and had a thymectomy in the past (required sternotomy).    He has a history of cocaine, amphetamine, and marijuana abuse (none x 2 years). He is an active smoker.   TTE showed significant MR recently, so TEE was done in 5/22. This showed LV EF 25-30%, moderate RV enlargement with mildly decreased RV systolic function, severe MR with ERO 0.65.  Right and left heart cath in 5/22 showed elevated PCWP with prominent V waves, preserved cardiac output, patent LAD stents.    Patient had Mitraclip placement in 7/22.  Echo in 8/22 showed EF 34%, mild LV dilation, normal RV, s/p Mitraclip with mean gradient 7 mmHg and mild MR, mild-moderate AI, mild AS with mean gradient 12 mmHg.   Patient returns for followup of CHF.  He is short of breath if he walks fast up 2 flights of stairs.  However, no dyspnea walking on flat ground.  Able to play golf with no problems.  He is feeling better post-Mitraclip. No chest pain.  He is not taking torsemide.  With low BP, Toprol XL was recently decreased.  BP is 128/70 today.  No orthostatic symptoms.   Labs (5/22): K 3.9, creatinine 1.0.  Labs (8/22): K 4.5, creatinine 1.13  ECG (4/22, personally reviewed): NSR, old anterior  MI  PMH: 1. CAD: Anterior STEMI 2019.  Totally occluded proximal LAD treated with DES.  - Recurrent anterior STEMI 2/20 with proximal LAD occluded, patient had aspiration thrombectomy and repeat PCI.   - LHC (5/22): Patent LAD stents, nonobstructive disease.  2. PAD: H/o left femoral endarterectomy 2016 and later femoral artery bypass 2017.  3. COPD: Active smoker.  4. Myasthenia gravis s/p thymectomy.  5. OSA: Unable to tolerate CPAP.  6. Chronic systolic CHF: Ischemic cardiomyopathy.  - RHC (5/22) with mean RA 5, PA 55/23 mean 37, mean PCWP 25 with v waves to 42, CI 2.3 - TEE (5/22): LV EF 25-30%, moderate RV enlargement with mildly decreased RV systolic function, severe MR with ERO 0.65.  - Echo (8/22): EF 34%, mild LV dilation, normal RV, s/p Mitraclip with mean gradient 7 mmHg and mild MR, mild-moderate AI, mild AS with mean gradient 12 mmHg.  7. Mitral regurgitation: See 5/22 TEE above.  Suspect infarct-related MR.  - Mitraclip 7/22 8. Polysubstance abuse: Cocaine, marijuana, amphetamines.  He has been off all of these for 2 years.  9. Hypertension 10. Hyperlipidemia 11. Aortic stenosis: Mild on 8/22 echo.   Social History   Socioeconomic History   Marital status: Divorced    Spouse name: Not on file   Number of children: Not on file   Years of education: Not on file   Highest education level: Not on file  Occupational History    Employer: [employer]  Tobacco Use   Smoking status: Every Day    Packs/day: 0.50    Types: Cigarettes   Smokeless tobacco: Current  Vaping Use   Vaping Use: Never used  Substance and Sexual Activity   Alcohol use: Yes    Comment: none in past several weeks, but normally about 5 beers a week, endorses binge drinking at celebrations   Drug use: Yes    Types: Cocaine, Marijuana, Methamphetamines, Benzodiazepines    Comment: vaguely discusses useage, but quickly changes subject   Sexual activity: Yes    Partners: Female  Other  Topics Concern   Not on file  Social History Narrative   Divorced   Employed at [employer]   Social Determinants of Health   Financial Resource Strain: Not on file  Food Insecurity: Not on file  Transportation Needs: Not on file  Physical Activity: Not on file  Stress: Not on file  Social Connections: Not on file  Intimate Partner Violence: Not on file   Family History  Problem Relation Age of Onset   Alzheimer's disease Father    ROS: All systems reviewed and negative except as per HPI.   Current Outpatient Medications  Medication Sig Dispense Refill   atorvastatin (LIPITOR) 40 MG tablet Take 1 tablet (40 mg total) by mouth daily. 90 tablet 1   clopidogrel (PLAVIX) 75 MG tablet Take 1 tablet (75 mg total) by mouth daily. 90 tablet 3   empagliflozin (JARDIANCE) 10 MG TABS tablet Take 1 tablet (10 mg total) by mouth daily before breakfast. 90 tablet 0   loratadine (CLARITIN) 10 MG tablet Take 10 mg by mouth daily as needed for allergies.     nitroGLYCERIN (NITROSTAT) 0.4 MG SL tablet Place 1 tablet (0.4 mg total) under the tongue every 5 (five) minutes as needed for chest pain (max 3 doses). 10 tablet 0   pantoprazole (PROTONIX) 40 MG tablet Take 1 tablet (40 mg total) by mouth daily. 90 tablet 3   sacubitril-valsartan (ENTRESTO) 24-26 MG Take 1 tablet by mouth 2 (two) times daily. 60 tablet 6   Sodium Chloride-Sodium Bicarb (NETI POT SINUS WASH NA) Place 1 application into the nose once a week.     metoprolol succinate (TOPROL XL) 25 MG 24 hr tablet Take 1 tablet (25 mg total) by mouth in the morning and at bedtime. 180 tablet 3   spironolactone (ALDACTONE) 25 MG tablet Take 1 tablet (25 mg total) by mouth daily. 90 tablet 3   No current facility-administered medications for this encounter.   BP 128/70   Pulse 63   Wt 75 kg (165 lb 6.4 oz)   SpO2 98%   BMI 24.43 kg/m  General: NAD Neck: No JVD, no thyromegaly or thyroid nodule.  Lungs:  Clear to auscultation bilaterally with normal respiratory effort. CV: Nondisplaced PMI.  Heart regular S1/S2, no S3/S4, 2/6 early SEM RUSB.  No peripheral edema.  No carotid bruit.  Normal pedal pulses.  Abdomen: Soft, nontender, no hepatosplenomegaly, no distention.  Skin: Intact without lesions or rashes.  Neurologic: Alert and oriented x 3.  Psych: Normal affect. Extremities: No clubbing or cyanosis.  HEENT: Normal.   Assessment/Plan: 1. Chronic systolic CHF: Ischemic cardiomyopathy.  TEE in 5/22 with LV EF 25-30%, moderate RV enlargement with mildly decreased RV systolic function, severe MR with ERO 0.65.  [place] in 5/22 showed preserved cardiac output.  RA pressure was normal but PCWP was elevated with prominent v-waves; consistent with severe mitral  regurgitation.  He is now s/p Mitraclip.  Echo in 8/22 showed EF 34%, mild LV dilation, normal RV, s/p Mitraclip with mean gradient 7 mmHg and mild MR, mild-moderate AI, mild AS with mean gradient 12 mmHg. On exam today, he does not look significantly volume overloaded.  NYHA class II symptoms.  - I think that he will tolerate increase in Toprol XL to 25 mg bid.   - Continue Entresto 24/26 bid.  - Increase spironolactone back to 25 mg daily.  BMET today and in 10 days.  - He can stay off torsemide.  - Continue Farxiga 10 mg daily.  - EF remained < 35% on 8/22 echo, I will refer for ICD.  Narrow QRS so not a CRT candidate.  2. CAD: Anterior STEMI in 2019 and again in 2/20.  [place] 5/22 with patent LAD stents.  - Continue Plavix.  - Continue atorvastatin 40 mg daily.  3. OSA: Has not tolerate CPAP.  4. Mitral regurgitation: S/p Mitraclip 7/22.  Echo in 8/22 with mild residual MR, mean gradient 7 mmHg.  5. Active smoking: I strongly encouraged him to quit.  6. PAD: Continue statin, Plavix.     He will followup in 3 months.   Loralie Champagne 08/04/2021

## 2021-08-05 NOTE — Addendum Note (Signed)
Encounter addended by: Eileen Stanford, PA-C on: 08/05/2021 11:00 AM  Actions taken: Flowsheet accepted

## 2021-08-13 ENCOUNTER — Other Ambulatory Visit (HOSPITAL_COMMUNITY): Payer: Self-pay

## 2021-08-15 ENCOUNTER — Other Ambulatory Visit: Payer: Self-pay

## 2021-08-15 ENCOUNTER — Ambulatory Visit (HOSPITAL_COMMUNITY)
Admission: RE | Admit: 2021-08-15 | Discharge: 2021-08-15 | Disposition: A | Discharge status: Home / Self Care | Payer: No Typology Code available for payment source | Source: Ambulatory Visit | Attending: Internal Medicine | Admitting: Internal Medicine

## 2021-08-15 DIAGNOSIS — I255 Ischemic cardiomyopathy: Secondary | ICD-10-CM | POA: Insufficient documentation

## 2021-08-15 DIAGNOSIS — I509 Heart failure, unspecified: Secondary | ICD-10-CM | POA: Diagnosis present

## 2021-08-15 LAB — BASIC METABOLIC PANEL
Anion gap: 9 (ref 5–15)
BUN: 8 mg/dL (ref 6–20)
CO2: 24 mmol/L (ref 22–32)
Calcium: 9.5 mg/dL (ref 8.9–10.3)
Chloride: 101 mmol/L (ref 98–111)
Creatinine, Ser: 1.07 mg/dL (ref 0.61–1.24)
GFR, Estimated: >60 mL/min (ref >60)
Glucose, Bld: 119 mg/dL — ABNORMAL HIGH (ref 70–99)
Potassium: 4.3 mmol/L (ref 3.5–5.1)
Sodium: 134 mmol/L — ABNORMAL LOW (ref 135–145)

## 2021-08-15 MED ORDER — EMPAGLIFLOZIN 10 MG PO TABS: 10.0000 mg | ORAL_TABLET | Freq: Every day | ORAL | 3 refills | Status: DC

## 2021-08-19 ENCOUNTER — Other Ambulatory Visit: Payer: Self-pay | Admitting: Cardiovascular Disease

## 2021-08-22 ENCOUNTER — Institutional Professional Consult (permissible substitution): Payer: No Typology Code available for payment source | Admitting: Cardiology

## 2021-08-22 NOTE — Progress Notes (Deleted)
Electrophysiology Office Note   Date:  08/22/2021   ID:  Howard Thompson, DOB [birth year], MRN [id number]  PCP:  Jamesetta Geralds, MD  Cardiologist:  Aundra Dubin Primary Electrophysiologist:  Bond Grieshop Meredith Leeds, MD    Chief Complaint: CHF   History of Present Illness: Howard Thompson is a 55 y.o. male who is being seen today for the evaluation of CHF at the request of Larey Dresser, MD. Presenting today for electrophysiology evaluation.  He has a history significant for coronary artery disease, chronic systolic heart failure due to ischemic cardiomyopathy, mitral regurgitation, peripheral arterial disease, polysubstance abuse, tobacco abuse.  He had left femoral endarterectomy in 2016 and later left femoral artery bypass in 2017 at [facility].  2019 he had a anterior STEMI complicated by cardiac arrest.  Echo showed an ejection fraction of 25 to 30%.  Hospitalization was complicated by GI bleeding and AKI requiring temporary dialysis.  He had drug-eluting stent to the proximal LAD.  February 2020 he had recurrent anterior STEMI with occluded proximal LAD treated with aspiration thrombectomy and PCI.  He has a history of cocaine, amphetamine, marijuana use but has not used in the last 2 years.  He is an active smoker.  He had a mitral clip placed July 2022.  Echo showed an ejection fraction of 34%.  Today, he denies*** symptoms of palpitations, chest pain, shortness of breath, orthopnea, PND, lower extremity edema, claudication, dizziness, presyncope, syncope, bleeding, or neurologic sequela. The patient is tolerating medications without difficulties.    Past Medical History:  Diagnosis Date   Acute renal failure ([place])    a. during 7-05/2018 adm for cardiac arrest, requiring HD temporarily.   Alcohol abuse    Anemia    AVM (arteriovenous malformation) of colon    Cardiac arrest ([place]) 05/22/2018   Vfib   CHF (congestive heart failure) (HCC)    Cocaine abuse (HCC)    COPD (chronic  obstructive pulmonary disease) (HCC)    Coronary artery disease    a. hx of POBA in [place]. b. out-of-hospital cardiac arrest 04/2018 (vfib/STEMI) s/p DES to prox LAD (complex admission with GIB, renal failure requiring HD, encephalopathy). c. STEMI 11/2018 after pt stopped Plavix - s/p DES to LAD.   Duodenitis    GERD (gastroesophageal reflux disease)    GI bleed    History of blood transfusion    Hyperlipidemia    Iron deficiency anemia    Ischemic cardiomyopathy    a. EF <25% at time of cath/arrest 04/2018, improved to 40-45% by f/u echo 06/09/18.   Myasthenia gravis ([place])    Myocardial infarction ([place]) 05/22/2018   STEMI   PAD (peripheral artery disease) (HCC)    a. femoral bypass   Polysubstance abuse ([place])    S/P mitral valve clip implantation 05/23/2021   s/p MitraClip NTW device A2/P2 by Dr Burt Knack   Shock liver    a. during 7-05/2018 adm for cardiac arrest and again in 11/2018.   Sleep apnea    Thrombocytopenia ([place]) 04/2018   Tobacco abuse    Ventricular fibrillation [facility])    Past Surgical History:  Procedure Laterality Date   ATHERECTOMY     BIOPSY  07/16/2018   Procedure: BIOPSY;  Surgeon: Gatha Mayer, MD;  Location: [facility] ENDOSCOPY;  Service: Endoscopy;;   CARDIAC CATHETERIZATION  05/22/2018   stent   COLONOSCOPY WITH PROPOFOL N/A 07/16/2018   Procedure: COLONOSCOPY WITH PROPOFOL;  Surgeon: Gatha Mayer, MD;  Location: [place];  Service: Endoscopy;  Laterality: N/A;   CORONARY ANGIOPLASTY     CORONARY STENT INTERVENTION N/A 05/22/2018   Procedure: CORONARY STENT INTERVENTION;  Surgeon: Lorretta Harp, MD;  Location: [place] CV LAB;  Service: Cardiovascular;  Laterality: N/A;   CORONARY/GRAFT ACUTE MI REVASCULARIZATION N/A 12/16/2018   Procedure: CORONARY/GRAFT ACUTE MI REVASCULARIZATION;  Surgeon: Troy Sine, MD;  Location: [place] CV LAB;  Service: Cardiovascular;  Laterality: N/A;   ENTEROSCOPY N/A 01/22/2019   Procedure: ENTEROSCOPY;  Surgeon:  Thornton Park, MD;  Location: [place];  Service: Gastroenterology;  Laterality: N/A;   ESOPHAGOGASTRODUODENOSCOPY (EGD) WITH PROPOFOL N/A 06/10/2018   Procedure: ESOPHAGOGASTRODUODENOSCOPY (EGD) WITH PROPOFOL;  Surgeon: Ladene Artist, MD;  Location: [facility] ENDOSCOPY;  Service: Endoscopy;  Laterality: N/A;   ESOPHAGOGASTRODUODENOSCOPY (EGD) WITH PROPOFOL N/A 07/16/2018   Procedure: ESOPHAGOGASTRODUODENOSCOPY (EGD) WITH PROPOFOL;  Surgeon: Gatha Mayer, MD;  Location: [place];  Service: Endoscopy;  Laterality: N/A;   FEMORAL ARTERY - POPLITEAL ARTERY BYPASS GRAFT     GIVENS CAPSULE STUDY N/A 01/20/2019   Procedure: GIVENS CAPSULE STUDY;  Surgeon: Gatha Mayer, MD;  Location: [place];  Service: Endoscopy;  Laterality: N/A;   HOT HEMOSTASIS N/A 07/16/2018   Procedure: HOT HEMOSTASIS (ARGON PLASMA COAGULATION/BICAP);  Surgeon: Gatha Mayer, MD;  Location: [facility] ENDOSCOPY;  Service: Endoscopy;  Laterality: N/A;   HOT HEMOSTASIS N/A 01/22/2019   Procedure: HOT HEMOSTASIS (ARGON PLASMA COAGULATION/BICAP);  Surgeon: Thornton Park, MD;  Location: [place];  Service: Gastroenterology;  Laterality: N/A;   IABP INSERTION N/A 12/16/2018   Procedure: IABP Insertion;  Surgeon: Troy Sine, MD;  Location: [place] CV LAB;  Service: Cardiovascular;  Laterality: N/A;   IR FLUORO GUIDE CV LINE RIGHT  05/31/2018   IR REMOVAL TUN CV CATH W/O FL  06/21/2018   IR US GUIDE VASC ACCESS RIGHT  05/31/2018   LEFT HEART CATH AND CORONARY ANGIOGRAPHY N/A 05/22/2018   Procedure: LEFT HEART CATH AND CORONARY ANGIOGRAPHY;  Surgeon: Lorretta Harp, MD;  Location: [place] CV LAB;  Service: Cardiovascular;  Laterality: N/A;   LEFT HEART CATH AND CORONARY ANGIOGRAPHY N/A 12/16/2018   Procedure: LEFT HEART CATH AND CORONARY ANGIOGRAPHY;  Surgeon: Troy Sine, MD;  Location: [place] CV LAB;  Service: Cardiovascular;  Laterality: N/A;   MITRAL VALVE REPAIR N/A 05/23/2021   Procedure: MITRAL  VALVE REPAIR;  Surgeon: Sherren Mocha, MD;  Location: [place] CV LAB;  Service: Cardiovascular;  Laterality: N/A;   RIGHT/LEFT HEART CATH AND CORONARY ANGIOGRAPHY N/A 02/27/2021   Procedure: RIGHT/LEFT HEART CATH AND CORONARY ANGIOGRAPHY;  Surgeon: Troy Sine, MD;  Location: [place] CV LAB;  Service: Cardiovascular;  Laterality: N/A;   TEE WITHOUT CARDIOVERSION N/A 02/27/2021   Procedure: TRANSESOPHAGEAL ECHOCARDIOGRAM (TEE);  Surgeon: Werner Lean, MD;  Location: [facility] ENDOSCOPY;  Service: Cardiovascular;  Laterality: N/A;   TEE WITHOUT CARDIOVERSION N/A 05/23/2021   Procedure: TRANSESOPHAGEAL ECHOCARDIOGRAM (TEE);  Surgeon: Sherren Mocha, MD;  Location: [place] CV LAB;  Service: Cardiovascular;  Laterality: N/A;     Current Outpatient Medications  Medication Sig Dispense Refill   atorvastatin (LIPITOR) 40 MG tablet TAKE 1 TABLET(40 MG) BY MOUTH DAILY 90 tablet 1   clopidogrel (PLAVIX) 75 MG tablet Take 1 tablet (75 mg total) by mouth daily. 90 tablet 3   empagliflozin (JARDIANCE) 10 MG TABS tablet Take 1 tablet (10 mg total) by mouth daily before breakfast. 90 tablet 3   loratadine (CLARITIN) 10 MG tablet Take 10 mg by  mouth daily as needed for allergies.     metoprolol succinate (TOPROL XL) 25 MG 24 hr tablet Take 1 tablet (25 mg total) by mouth in the morning and at bedtime. 180 tablet 3   nitroGLYCERIN (NITROSTAT) 0.4 MG SL tablet Place 1 tablet (0.4 mg total) under the tongue every 5 (five) minutes as needed for chest pain (max 3 doses). 10 tablet 0   pantoprazole (PROTONIX) 40 MG tablet Take 1 tablet (40 mg total) by mouth daily. 90 tablet 3   sacubitril-valsartan (ENTRESTO) 24-26 MG Take 1 tablet by mouth 2 (two) times daily. 60 tablet 6   Sodium Chloride-Sodium Bicarb (NETI POT SINUS WASH NA) Place 1 application into the nose once a week.     spironolactone (ALDACTONE) 25 MG tablet Take 1 tablet (25 mg total) by mouth daily. 90 tablet 3   No current  facility-administered medications for this visit.    Allergies:   Patient has no known allergies.   Social History:  The patient  reports that he has been smoking cigarettes. He has been smoking an average of .5 packs per day. He uses smokeless tobacco. He reports current alcohol use. He reports current drug use. Drugs: Cocaine, Marijuana, Methamphetamines, and Benzodiazepines.   Family History:  The patient's family history includes Alzheimer's disease in his father.    ROS:  Please see the history of present illness.   Otherwise, review of systems is positive for none.   All other systems are reviewed and negative.    PHYSICAL EXAM: VS:  There were no vitals taken for this visit. , BMI There is no height or weight on file to calculate BMI. GEN: Well nourished, well developed, in no acute distress  HEENT: normal  Neck: no JVD, carotid bruits, or masses Cardiac: ***RRR; no murmurs, rubs, or gallops,no edema  Respiratory:  clear to auscultation bilaterally, normal work of breathing GI: soft, nontender, nondistended, + BS MS: no deformity or atrophy  Skin: warm and dry Neuro:  Strength and sensation are intact Psych: euthymic mood, full affect  EKG:  EKG {ACTION; IS/IS [id number]} ordered today. Personal review of the ekg ordered *** shows ***  Recent Labs: 02/13/2021: Magnesium 2.1 05/21/2021: ALT 17 05/24/2021: Hemoglobin 9.4; Platelets 275 08/01/2021: B Natriuretic Peptide 202.9 08/15/2021: BUN 8; Creatinine, Ser 1.07; Potassium 4.3; Sodium 134    Lipid Panel     Component Value Date/Time   CHOL 124 02/13/2021 1637   TRIG 42 02/13/2021 1637   HDL 36 (L) 02/13/2021 1637   CHOLHDL 3.4 02/13/2021 1637   CHOLHDL 3.6 12/17/2018 0237   VLDL 14 12/17/2018 0237   LDLCALC 78 02/13/2021 1637     Wt Readings from Last 3 Encounters:  08/01/21 165 lb 6.4 oz (75 kg)  06/05/21 165 lb 12.8 oz (75.2 kg)  05/30/21 166 lb 12.8 oz (75.7 kg)      Other studies Reviewed: Additional  studies/ records that were reviewed today include: TTE 06/18/21  Review of the above records today demonstrates:   1. Left ventricular ejection fraction by 3D volume is 34 %. The left  ventricle has moderately decreased function. The left ventricle  demonstrates regional wall motion abnormalities (see scoring  diagram/findings for description). The left ventricular  internal cavity size was mildly dilated. Left ventricular diastolic  parameters are indeterminate. The average left ventricular global  longitudinal strain is -7.9 %. The global longitudinal strain is abnormal.   2. Right ventricular systolic function is normal. The right ventricular  size  is normal. There is normal pulmonary artery systolic pressure. The  estimated right ventricular systolic pressure is 18.8 mmHg.   3. Left atrial size was mild to moderately dilated.   4. The mitral valve has been repaired/replaced. Mild mitral valve  regurgitation. The mean mitral valve gradient is 7.2 mmHg with average  heart rate of 89 bpm. There is a Mitra-Clip present in the mitral  position. Procedure Date: 05/23/21.   5. The aortic valve is abnormal. There is mild calcification of the  aortic valve. Aortic valve regurgitation is mild to moderate. Mild aortic  valve stenosis. Aortic valve area, by VTI measures 2.59 cm. Aortic valve  mean gradient measures 12.0 mmHg.  Aortic valve Vmax measures 2.23 m/s.   6. The inferior vena cava is normal in size with greater than 50%  respiratory variability, suggesting right atrial pressure of 3 mmHg.   7. Iatrogenic left to right atrial level shunt post procedure from  transeptal puncture. Evidence of atrial level shunting detected by color  flow Doppler.   ASSESSMENT AND PLAN:  1.  Chronic systolic heart failure due to ischemic cardiomyopathy: Ejection fraction 30 to 35%.  Currently on optimal medical therapy with Toprol-XL, Entresto, Aldactone, [place].  He would benefit from ICD implant as his  ejection fraction is remained low.***  2.  Coronary artery disease: Status post anterior STEMI x2.  Continue Plavix, atorvastatin  3.  Obstructive sleep apnea: Does not tolerate CPAP  4.  Mitral regurgitation: Status post mitral clip July 2022.  Plan per primary cardiology.  5.  Tobacco abuse: Complete cessation encouraged  6.  Peripheral arterial disease: Continue statin, Plavix  Current medicines are reviewed at length with the patient today.   The patient {ACTIONS; HAS/DOES NOT HAVE:19233} concerns regarding his medicines.  The following changes were made today:  {NONE DEFAULTED:18576}  Labs/ tests ordered today include: *** No orders of the defined types were placed in this encounter.    Disposition:   FU with Deeandra Jerry {gen number [id number]} {Days to years:10300}  Signed, Umi Mainor Meredith Leeds, MD  08/22/2021 7:43 AM     [facility] [address] [place] [address] [phone] (office) [phone] (fax)

## 2021-09-23 ENCOUNTER — Other Ambulatory Visit: Payer: Self-pay | Admitting: Adult Health

## 2021-09-24 ENCOUNTER — Institutional Professional Consult (permissible substitution): Payer: No Typology Code available for payment source | Admitting: Cardiology

## 2021-09-25 NOTE — Telephone Encounter (Signed)
This is a CHF pt 

## 2021-10-03 ENCOUNTER — Encounter (HOSPITAL_COMMUNITY): Payer: Self-pay | Admitting: Cardiology

## 2021-10-30 ENCOUNTER — Encounter (HOSPITAL_COMMUNITY): Payer: No Typology Code available for payment source | Admitting: Cardiology

## 2021-12-11 ENCOUNTER — Other Ambulatory Visit: Payer: Self-pay | Admitting: Physician Assistant

## 2022-02-08 ENCOUNTER — Other Ambulatory Visit: Payer: Self-pay | Admitting: Student

## 2022-04-20 ENCOUNTER — Other Ambulatory Visit: Payer: Self-pay | Admitting: Student

## 2022-04-20 ENCOUNTER — Other Ambulatory Visit: Payer: Self-pay | Admitting: Cardiovascular Disease

## 2022-04-22 ENCOUNTER — Other Ambulatory Visit: Payer: Self-pay | Admitting: Cardiology

## 2022-04-22 ENCOUNTER — Other Ambulatory Visit: Payer: Self-pay | Admitting: Cardiovascular Disease

## 2022-04-22 DIAGNOSIS — Z9889 Other specified postprocedural states: Secondary | ICD-10-CM

## 2022-04-22 DIAGNOSIS — I340 Nonrheumatic mitral (valve) insufficiency: Secondary | ICD-10-CM

## 2022-04-22 DIAGNOSIS — Z95818 Presence of other cardiac implants and grafts: Secondary | ICD-10-CM

## 2022-04-22 NOTE — Telephone Encounter (Signed)
Cline Crock, PA saw pt for Dr. Tresa Endo. Dr. Tresa Endo is the primary cardiologist. Please send in refill thanks

## 2022-05-25 ENCOUNTER — Other Ambulatory Visit: Payer: Self-pay | Admitting: Cardiovascular Disease

## 2022-05-27 NOTE — Progress Notes (Deleted)
HEART AND [place]                                     Cardiology Office Note:    Date:  05/27/2022   ID:  Howard Thompson, DOB 1966-09-17, MRN [id number]  PCP:  Jamesetta Geralds, MD  [facility] Cardiologist:  Shelva Majestic, MD  [place] Electrophysiologist:  None   Referring MD: Jamesetta Geralds, *   No chief complaint on file. ***  History of Present Illness:    Howard Thompson is a 56 y.o. male with a hx of HTN, HLD, CAD, ischemic cardiomyopathy, myasthenia gravis and had a thymectomy by sternotomy, PAD, smoking, polysubstance abuse, moderate AI and severe non rheumatic mitral regurgitation s/p TEER (05/23/21) who presents to clinic for follow up.    Patient had left femoral endarterectomy in 2016 and later left femoral artery bypass in 2017 at [facility].  In 2019, he had anterior STEMI complicated by cardiac arrest.  EF by echo at this time was 25-30%.  Hospitalization was complicated by GI bleeding and AKI requiring temporary HD.  He had DES to proximal LAD. In 2/20, he had recurrent anterior STEMI with totally occluded proximal LAD treated with aspiration thrombectomy and PCI.     He additionally has myasthenia gravis and had a thymectomy in the past (required sternotomy). He has a history of cocaine, amphetamine, and marijuana abuse (none x 2 years). He is an active smoker.     TTE showed significant MR recently, so TEE was done in 5/22. This showed LV EF 25-30%, moderate RV enlargement with mildly decreased RV systolic function, severe MR with ERO 0.65.  Right and left heart cath in 5/22 showed elevated PCWP with prominent V waves, preserved cardiac output, patent LAD stents.  Patient has seen Dr. Burt Knack regarding Mitraclip placement. He reported exertional dyspnea with moderate activity such as walking up a flight of stairs as well as significant generalized fatigue.  The patient was referred for advanced heart failure  consultation and was seen by Dr. Aundra Dubin April 04, 2021.  He was on a good medical program already established with carvedilol, Entresto, spironolactone, and torsemide.  Wilder Glade was added to his regimen.  He was felt to be an appropriate candidate for transcatheter edge-to-edge mitral valve repair.   He was evaluated by the multidisciplinary valve team and underwent successful TEER with a single MitraClip NTW device A2/P2, reducing MR from moderate to mild. Mean gradient increased from 1 mmHg to 7 mmHg post-Clip placement with a HR of approximately 80 bpm. Coreg was increased from 6.'25mg'$  BID to 12.'5mg'$  BID. Post operative echo showed EF 30-35%, mild residual MR with mild MS  (mean gradient of 6 mm Hg). He was continued on home Plavix '75mg'$  daily without additional aspirin therapy as the patient told me he has had issues with bleeding on aspirin in the past. The patient appeared euvolemic but slightly dry since addition of Jardiance when seen by Dr. Aundra Dubin. He was continued on home meds Coreg (increased from 6.'25mg'$  BID to 12.5 mg BID given mildly elevated mean gradients after clip), Entresto 24-'26mg'$  daily, spiro '25mg'$  daily, and Jardiance '10mg'$  daily, but torsemide '20mg'$  daily was discontinued at discharge. Discharge weight 166lbs.   At his 1 week post hospital visit, he had symptomatic hypotension and his [name] was held and Coreg backed back down from 12.'5mg'$  BID to 6.'25mg'$   BID. Creat was stable at 1.13. Given mildly elevated gradients on repeat echo he was transitioned from carvedilol to Toprol for better HR control with less BP impact.   He was then seen by Dr. Aundra Dubin 08/01/21 he reported playing golf with no issues. His hypotension was stablized     %%%%Today the patient presents to clinic for follow up. He is doing well. Here with friend. Dizziness and fatigue are better. BPs have been much better. No CP or SOB. No LE edema, orthopnea or PND. No dizziness or syncope. No blood in stool or urine. No  palpitations.      Severe MR s/p TEER: doing well. Continue on plavix alone given intolerance to aspirin. SBE prophylaxis discussed; he has amoxicillin. Given mildly elevated gradients after clip we increased Coreg but he had symptomatic hypotension last visit and this was decreased back to 6.'25mg'$  BID. Will change him to Toprol XL '25mg'$  daily for better HR control and less BP control given borderline pressure today.    Chronic systolic CHF: appears euvolemic. EF 30-35%. CHF regimen was Coreg 12.'5mg'$  BID, Entresto 24-'26mg'$  BID, Spiro '25mg'$  daily and Jardiance '10mg'$  daily. Given symptomatic hypotension last visit, Coreg decreased back to 6.'25mg'$  BID and I held [place]. Follow up labs were okay. Today he presents and BP 102/60 mm hg. He says it's been a little higher at home. Will change Coreg to Toprol XL '25mg'$  daily and cut spiro to 12.'5mg'$  daily and add back Entresto. This is quite expensive for him and I have provided samples and copay cards for him today.    HTN: being treated in the context of CHF. As above, Bps have been low but he is asymptomatic.    PAD: continue medical therapy    PMH: 1. CAD: Anterior STEMI 2019.  Totally occluded proximal LAD treated with DES.  - Recurrent anterior STEMI 2/20 with proximal LAD occluded, patient had aspiration thrombectomy and repeat PCI.   - LHC (5/22): Patent LAD stents, nonobstructive disease.  2. PAD: H/o left femoral endarterectomy 2016 and later femoral artery bypass 2017.  3. COPD: Active smoker.  4. Myasthenia gravis s/p thymectomy.  5. OSA: Unable to tolerate CPAP.  6. Chronic systolic CHF: Ischemic cardiomyopathy.  - RHC (5/22) with mean RA 5, PA 55/23 mean 37, mean PCWP 25 with v waves to 42, CI 2.3 - TEE (5/22): LV EF 25-30%, moderate RV enlargement with mildly decreased RV systolic function, severe MR with ERO 0.65.  - Echo (8/22): EF 34%, mild LV dilation, normal RV, s/p Mitraclip with mean gradient 7 mmHg and mild MR, mild-moderate AI, mild  AS with mean gradient 12 mmHg.  7. Mitral regurgitation: See 5/22 TEE above.  Suspect infarct-related MR.  - Mitraclip 7/22 8. Polysubstance abuse: Cocaine, marijuana, amphetamines.  He has been off all of these for 2 years.  9. Hypertension 10. Hyperlipidemia 11. Aortic stenosis: Mild on 8/22 echo.      Past Medical History:  Diagnosis Date   Acute renal failure ([place])    a. during 7-05/2018 adm for cardiac arrest, requiring HD temporarily.   Alcohol abuse    Anemia    AVM (arteriovenous malformation) of colon    Cardiac arrest ([place]) 05/22/2018   Vfib   CHF (congestive heart failure) (HCC)    Cocaine abuse (HCC)    COPD (chronic obstructive pulmonary disease) (HCC)    Coronary artery disease    a. hx of POBA in [place]. b. out-of-hospital cardiac arrest 04/2018 (vfib/STEMI) s/p DES to  prox LAD (complex admission with GIB, renal failure requiring HD, encephalopathy). c. STEMI 11/2018 after pt stopped Plavix - s/p DES to LAD.   Duodenitis    GERD (gastroesophageal reflux disease)    GI bleed    History of blood transfusion    Hyperlipidemia    Iron deficiency anemia    Ischemic cardiomyopathy    a. EF <25% at time of cath/arrest 04/2018, improved to 40-45% by f/u echo 06/09/18.   Myasthenia gravis ([place])    Myocardial infarction ([place]) 05/22/2018   STEMI   PAD (peripheral artery disease) (HCC)    a. femoral bypass   Polysubstance abuse ([place])    S/P mitral valve clip implantation 05/23/2021   s/p MitraClip NTW device A2/P2 by Dr Burt Knack   Shock liver    a. during 7-05/2018 adm for cardiac arrest and again in 11/2018.   Sleep apnea    Thrombocytopenia ([place]) 04/2018   Tobacco abuse    Ventricular fibrillation [facility])     Past Surgical History:  Procedure Laterality Date   ATHERECTOMY     BIOPSY  07/16/2018   Procedure: BIOPSY;  Surgeon: Gatha Mayer, MD;  Location: [facility] ENDOSCOPY;  Service: Endoscopy;;   CARDIAC CATHETERIZATION  05/22/2018   stent   COLONOSCOPY WITH PROPOFOL N/A  07/16/2018   Procedure: COLONOSCOPY WITH PROPOFOL;  Surgeon: Gatha Mayer, MD;  Location: [place];  Service: Endoscopy;  Laterality: N/A;   CORONARY ANGIOPLASTY     CORONARY STENT INTERVENTION N/A 05/22/2018   Procedure: CORONARY STENT INTERVENTION;  Surgeon: Lorretta Harp, MD;  Location: [place] CV LAB;  Service: Cardiovascular;  Laterality: N/A;   CORONARY/GRAFT ACUTE MI REVASCULARIZATION N/A 12/16/2018   Procedure: CORONARY/GRAFT ACUTE MI REVASCULARIZATION;  Surgeon: Troy Sine, MD;  Location: [place] CV LAB;  Service: Cardiovascular;  Laterality: N/A;   ENTEROSCOPY N/A 01/22/2019   Procedure: ENTEROSCOPY;  Surgeon: Thornton Park, MD;  Location: [place];  Service: Gastroenterology;  Laterality: N/A;   ESOPHAGOGASTRODUODENOSCOPY (EGD) WITH PROPOFOL N/A 06/10/2018   Procedure: ESOPHAGOGASTRODUODENOSCOPY (EGD) WITH PROPOFOL;  Surgeon: Ladene Artist, MD;  Location: [facility] ENDOSCOPY;  Service: Endoscopy;  Laterality: N/A;   ESOPHAGOGASTRODUODENOSCOPY (EGD) WITH PROPOFOL N/A 07/16/2018   Procedure: ESOPHAGOGASTRODUODENOSCOPY (EGD) WITH PROPOFOL;  Surgeon: Gatha Mayer, MD;  Location: [place];  Service: Endoscopy;  Laterality: N/A;   FEMORAL ARTERY - POPLITEAL ARTERY BYPASS GRAFT     GIVENS CAPSULE STUDY N/A 01/20/2019   Procedure: GIVENS CAPSULE STUDY;  Surgeon: Gatha Mayer, MD;  Location: [place];  Service: Endoscopy;  Laterality: N/A;   HOT HEMOSTASIS N/A 07/16/2018   Procedure: HOT HEMOSTASIS (ARGON PLASMA COAGULATION/BICAP);  Surgeon: Gatha Mayer, MD;  Location: [facility] ENDOSCOPY;  Service: Endoscopy;  Laterality: N/A;   HOT HEMOSTASIS N/A 01/22/2019   Procedure: HOT HEMOSTASIS (ARGON PLASMA COAGULATION/BICAP);  Surgeon: Thornton Park, MD;  Location: [place];  Service: Gastroenterology;  Laterality: N/A;   IABP INSERTION N/A 12/16/2018   Procedure: IABP Insertion;  Surgeon: Troy Sine, MD;  Location: [place] CV LAB;  Service:  Cardiovascular;  Laterality: N/A;   IR FLUORO GUIDE CV LINE RIGHT  05/31/2018   IR REMOVAL TUN CV CATH W/O FL  06/21/2018   IR US GUIDE VASC ACCESS RIGHT  05/31/2018   LEFT HEART CATH AND CORONARY ANGIOGRAPHY N/A 05/22/2018   Procedure: LEFT HEART CATH AND CORONARY ANGIOGRAPHY;  Surgeon: Lorretta Harp, MD;  Location: [place] CV LAB;  Service: Cardiovascular;  Laterality: N/A;  LEFT HEART CATH AND CORONARY ANGIOGRAPHY N/A 12/16/2018   Procedure: LEFT HEART CATH AND CORONARY ANGIOGRAPHY;  Surgeon: Troy Sine, MD;  Location: [place] CV LAB;  Service: Cardiovascular;  Laterality: N/A;   MITRAL VALVE REPAIR N/A 05/23/2021   Procedure: MITRAL VALVE REPAIR;  Surgeon: Sherren Mocha, MD;  Location: [place] CV LAB;  Service: Cardiovascular;  Laterality: N/A;   RIGHT/LEFT HEART CATH AND CORONARY ANGIOGRAPHY N/A 02/27/2021   Procedure: RIGHT/LEFT HEART CATH AND CORONARY ANGIOGRAPHY;  Surgeon: Troy Sine, MD;  Location: [place] CV LAB;  Service: Cardiovascular;  Laterality: N/A;   TEE WITHOUT CARDIOVERSION N/A 02/27/2021   Procedure: TRANSESOPHAGEAL ECHOCARDIOGRAM (TEE);  Surgeon: Werner Lean, MD;  Location: [facility] [place] ENDOSCOPY;  Service: Cardiovascular;  Laterality: N/A;   TEE WITHOUT CARDIOVERSION N/A 05/23/2021   Procedure: TRANSESOPHAGEAL ECHOCARDIOGRAM (TEE);  Surgeon: Sherren Mocha, MD;  Location: [place] CV LAB;  Service: Cardiovascular;  Laterality: N/A;    Current Medications: No outpatient medications have been marked as taking for the 06/02/22 encounter (Appointment) with [facility].     Allergies:   Patient has no known allergies.   Social History   Socioeconomic History   Marital status: Divorced    Spouse name: Not on file   Number of children: Not on file   Years of education: Not on file   Highest education level: Not on file  Occupational History    Employer: [employer]  Tobacco Use   Smoking status: Every Day     Packs/day: 0.50    Types: Cigarettes   Smokeless tobacco: Current  Vaping Use   Vaping Use: Never used  Substance and Sexual Activity   Alcohol use: Yes    Comment: none in past several weeks, but normally about 5 beers a week, endorses binge drinking at celebrations   Drug use: Yes    Types: Cocaine, Marijuana, Methamphetamines, Benzodiazepines    Comment: vaguely discusses useage, but quickly changes subject   Sexual activity: Yes    Partners: Female  Other Topics Concern   Not on file  Social History Narrative   Divorced   Employed at [employer] abuse   Social Determinants of Health   Financial Resource Strain: Not on file  Food Insecurity: Not on file  Transportation Needs: Not on file  Physical Activity: Not on file  Stress: Not on file  Social Connections: Unknown (12/19/2018)   Social Connection and Isolation Panel [NHANES]    Frequency of Communication with Friends and Family: Patient refused    Frequency of Social Gatherings with Friends and Family: Patient refused    Attends Religious Services: Patient refused    Marine scientist or Organizations: Patient refused    Attends Archivist Meetings: Patient refused    Marital Status: Patient refused     Family History: The patient's ***family history includes Alzheimer's disease in his father.  ROS:   Please see the history of present illness.    All other systems reviewed and are negative.  EKGs/Labs/Other Studies Reviewed:    The following studies were reviewed today:  05/23/21 MITRAL VALVE REPAIR      Conclusion   Successful transcatheter edge to edge mitral valve repair with placement of a single MitraClip NTW device A2/P2, reducing MR from moderate to mild. Mean gradient increased from 1 mmHg to 7 mmHg post-Clip placement with a HR of approximately 80 bpm   Plan: ASA and plavix x  3 months, then continue ASA alone, beta blockade as tolerated to a HR of about  60 bpm.     Recommendations   Antiplatelet/Anticoag Recommend uninterrupted dual antiplatelet therapy with Aspirin '81mg'$  daily and Clopidogrel '75mg'$  daily for 3 months.      _____________     Echo 05/24/21: IMPRESSIONS   1. Anteroseptal and apical setpal hypokinesis. Mid-apical anterior and  apical akinesis.. Left ventricular ejection fraction, by estimation, is 30  to 35%. The left ventricle has moderately decreased function. The left  ventricle demonstrates regional wall   motion abnormalities (see scoring diagram/findings for description). The  left ventricular internal cavity size was moderately dilated. There is  mild asymmetric left ventricular hypertrophy of the posterior segment.  Left ventricular diastolic parameters  are consistent with Grade I diastolic dysfunction (impaired relaxation).   2. Right ventricular systolic function is normal. The right ventricular  size is normal. There is normal pulmonary artery systolic pressure.   3. Left atrial size was severely dilated.   4. Compared with the echo 07/2020, Mitra-Clip is now present. Mitral  regurgitation has improved from severe to mild. The mitral valve has been  repaired/replaced. Trivial mitral valve regurgitation. Mild mitral  stenosis. The mean mitral valve gradient is   6.0 mmHg. There is a Mitra-Clip present in the mitral position.   5. The aortic valve is normal in structure. There is mild calcification  of the aortic valve. Aortic valve regurgitation is mild to moderate. Mild  aortic valve stenosis. Aortic regurgitation PHT measures 498 msec. Aortic  valve area, by VTI measures 2.23  cm. Aortic valve mean gradient measures 11.0 mmHg. Aortic valve Vmax  measures 2.31 m/s.   6. The inferior vena cava is normal in size with greater than 50%  respiratory variability, suggesting right atrial pressure of 3 mmHg.      EKG:  EKG is *** ordered today.  The ekg ordered today demonstrates ***  Recent  Labs: 08/01/2021: B Natriuretic Peptide 202.9 08/15/2021: BUN 8; Creatinine, Ser 1.07; Potassium 4.3; Sodium 134  Recent Lipid Panel    Component Value Date/Time   CHOL 124 02/13/2021 1637   TRIG 42 02/13/2021 1637   HDL 36 (L) 02/13/2021 1637   CHOLHDL 3.4 02/13/2021 1637   CHOLHDL 3.6 12/17/2018 0237   VLDL 14 12/17/2018 0237   LDLCALC 78 02/13/2021 1637     Risk Assessment/Calculations:   {Does this patient have ATRIAL FIBRILLATION?:[id number]}   Physical Exam:    VS:  There were no vitals taken for this visit.    Wt Readings from Last 3 Encounters:  08/01/21 165 lb 6.4 oz (75 kg)  06/05/21 165 lb 12.8 oz (75.2 kg)  05/30/21 166 lb 12.8 oz (75.7 kg)     GEN: *** Well nourished, well developed in no acute distress HEENT: Normal NECK: No JVD; No carotid bruits LYMPHATICS: No lymphadenopathy CARDIAC: ***RRR, no murmurs, rubs, gallops RESPIRATORY:  Clear to auscultation without rales, wheezing or rhonchi  ABDOMEN: Soft, non-tender, non-distended MUSCULOSKELETAL:  No edema; No deformity  SKIN: Warm and dry NEUROLOGIC:  Alert and oriented x 3 PSYCHIATRIC:  Normal affect   ASSESSMENT:    No diagnosis found. PLAN:    In order of problems listed above:       {Are you ordering a CV Procedure (e.g. stress test, cath, DCCV, TEE, etc)?   Press F2        :[id number]}    Medication Adjustments/Labs and Tests Ordered: Current medicines are reviewed at length  with the patient today.  Concerns regarding medicines are outlined above.  No orders of the defined types were placed in this encounter.  No orders of the defined types were placed in this encounter.   There are no Patient Instructions on file for this visit.   Signed, Kathyrn Drown, NP  05/27/2022 3:06 PM    [facility]

## 2022-05-29 ENCOUNTER — Other Ambulatory Visit (HOSPITAL_COMMUNITY): Payer: Self-pay | Admitting: Cardiology

## 2022-05-29 ENCOUNTER — Telehealth: Payer: Self-pay | Admitting: Cardiology

## 2022-05-29 MED ORDER — ENTRESTO 24-26 MG PO TABS: 1.0000 | ORAL_TABLET | Freq: Two times a day (BID) | ORAL | 0 refills | Status: DC

## 2022-05-29 MED ORDER — ATORVASTATIN CALCIUM 40 MG PO TABS: ORAL_TABLET | ORAL | 0 refills | Status: DC

## 2022-05-29 MED ORDER — CLOPIDOGREL BISULFATE 75 MG PO TABS: ORAL_TABLET | ORAL | 0 refills | Status: DC

## 2022-05-29 MED ORDER — SPIRONOLACTONE 25 MG PO TABS: 25.0000 mg | ORAL_TABLET | Freq: Every day | ORAL | 0 refills | Status: DC

## 2022-05-29 MED ORDER — METOPROLOL SUCCINATE ER 25 MG PO TB24: 25.0000 mg | ORAL_TABLET | Freq: Two times a day (BID) | ORAL | 0 refills | Status: DC

## 2022-05-29 MED ORDER — EMPAGLIFLOZIN 10 MG PO TABS: 10.0000 mg | ORAL_TABLET | Freq: Every day | ORAL | 0 refills | Status: DC

## 2022-05-29 NOTE — Telephone Encounter (Signed)
Addressed in refill encounter

## 2022-05-29 NOTE — Telephone Encounter (Signed)
Patient currently in [place] on family emergency, advised unable to send scripts over. Pt request to refill at local pharmacy-will have local family fedex to home in [place]  Meds sent Pt will need follow up once he returns to the states

## 2022-05-29 NOTE — Telephone Encounter (Signed)
Pt c/o medication issue:  1. Name of Medication: atorvastatin (LIPITOR) 40 MG tablet clopidogrel (PLAVIX) 75 MG tablet empagliflozin (JARDIANCE) 10 MG TABS tablet metoprolol succinate (TOPROL XL) 25 MG 24 hr tablet pantoprazole (PROTONIX) 40 MG tablet spironolactone (ALDACTONE) 25 MG tablet  2. How are you currently taking this medication (dosage and times per day)?   3. Are you having a reaction (difficulty breathing--STAT)?   4. What is your medication issue? Pt states that he had to take a last minute trip to [place] to see his mom who is in bad health. He says that he needs his medications transferred there for enough to last him a month. Requesting call back to discuss how to go about getting this sent to [place].

## 2022-06-02 ENCOUNTER — Ambulatory Visit (HOSPITAL_COMMUNITY): Payer: Self-pay

## 2022-06-02 ENCOUNTER — Ambulatory Visit: Payer: Self-pay

## 2022-06-24 ENCOUNTER — Telehealth: Payer: Self-pay

## 2022-06-24 NOTE — Telephone Encounter (Signed)
Called to reschedule the patient's 1 year echo and office visit, but he is still in [place] with his very ill mother and will likely be there for up to several months.   [facility] form completed. He will call when he is back in the [place] to arrange follow-up.

## 2022-08-18 ENCOUNTER — Other Ambulatory Visit (HOSPITAL_COMMUNITY): Payer: Self-pay | Admitting: Cardiology

## 2022-08-25 ENCOUNTER — Other Ambulatory Visit (HOSPITAL_COMMUNITY): Payer: Self-pay

## 2022-08-25 ENCOUNTER — Other Ambulatory Visit (HOSPITAL_COMMUNITY): Payer: Self-pay | Admitting: Cardiology

## 2022-08-25 MED ORDER — SPIRONOLACTONE 25 MG PO TABS: 25.0000 mg | ORAL_TABLET | Freq: Every day | ORAL | 0 refills | Status: DC

## 2022-08-25 MED ORDER — EMPAGLIFLOZIN 10 MG PO TABS: 10.0000 mg | ORAL_TABLET | Freq: Every day | ORAL | 0 refills | Status: DC

## 2022-08-26 ENCOUNTER — Other Ambulatory Visit (HOSPITAL_COMMUNITY): Payer: Self-pay

## 2022-08-26 MED ORDER — SPIRONOLACTONE 25 MG PO TABS: 25.0000 mg | ORAL_TABLET | Freq: Every day | ORAL | 3 refills | Status: DC

## 2022-10-04 ENCOUNTER — Other Ambulatory Visit: Payer: Self-pay | Admitting: Cardiovascular Disease

## 2022-10-07 ENCOUNTER — Other Ambulatory Visit: Payer: Self-pay | Admitting: Cardiovascular Disease

## 2022-12-30 ENCOUNTER — Other Ambulatory Visit (HOSPITAL_COMMUNITY): Payer: Self-pay | Admitting: Cardiology

## 2022-12-30 ENCOUNTER — Other Ambulatory Visit: Payer: Self-pay | Admitting: Internal Medicine

## 2022-12-30 ENCOUNTER — Other Ambulatory Visit (HOSPITAL_COMMUNITY): Payer: Self-pay

## 2022-12-30 ENCOUNTER — Telehealth (HOSPITAL_COMMUNITY): Payer: Self-pay | Admitting: Vascular Surgery

## 2022-12-30 MED ORDER — ENTRESTO 24-26 MG PO TABS: 1.0000 | ORAL_TABLET | Freq: Two times a day (BID) | ORAL | 0 refills | Status: DC

## 2022-12-30 MED ORDER — ATORVASTATIN CALCIUM 40 MG PO TABS: ORAL_TABLET | ORAL | 0 refills | Status: DC

## 2022-12-30 MED ORDER — SPIRONOLACTONE 25 MG PO TABS: 25.0000 mg | ORAL_TABLET | Freq: Every day | ORAL | 0 refills | Status: DC

## 2022-12-30 MED ORDER — EMPAGLIFLOZIN 10 MG PO TABS: 10.0000 mg | ORAL_TABLET | Freq: Every day | ORAL | 0 refills | Status: DC

## 2022-12-30 MED ORDER — METOPROLOL SUCCINATE ER 25 MG PO TB24: ORAL_TABLET | ORAL | 0 refills | Status: DC

## 2022-12-30 MED ORDER — CLOPIDOGREL BISULFATE 75 MG PO TABS: ORAL_TABLET | ORAL | 0 refills | Status: DC

## 2022-12-30 NOTE — Telephone Encounter (Signed)
Pt called to make f/u appt he is scheduled 4/12 w/ [facility] pt would like all heart failure meds refilled sent to [facility]

## 2023-01-26 ENCOUNTER — Other Ambulatory Visit (HOSPITAL_COMMUNITY): Payer: Self-pay | Admitting: Cardiology

## 2023-01-30 ENCOUNTER — Telehealth (HOSPITAL_COMMUNITY): Payer: Self-pay

## 2023-01-30 NOTE — Telephone Encounter (Signed)
Received Cardiac Clearance form from [facility] requesting patient be cleared for the following procedure cleaning,radiographs,fillings,crowns,bridge,denture,extractions,root canal therapy, local anesthetic with epi and implants with or without bone graft. Form was placed in Prince Rome, NP folder on 03/Apr/2024 for signature. Will update chart once clearance has been reviewed and signed by provider.   Medical clearance form was signed by Prince Rome, NP and successfully faxed to [phone] on Friday, April 5,. Form will be scanned into patients chart.

## 2023-02-06 ENCOUNTER — Ambulatory Visit (HOSPITAL_COMMUNITY)
Admission: RE | Admit: 2023-02-06 | Discharge: 2023-02-06 | Disposition: A | Discharge status: Home / Self Care | Payer: BC Managed Care – PPO | Source: Ambulatory Visit | Attending: Cardiology | Admitting: Cardiology

## 2023-02-06 ENCOUNTER — Encounter (HOSPITAL_COMMUNITY): Payer: Self-pay | Admitting: Cardiology

## 2023-02-06 VITALS — BP 130/70 | HR 90 | Wt 169.8 lb

## 2023-02-06 DIAGNOSIS — I110 Hypertensive heart disease with heart failure: Secondary | ICD-10-CM | POA: Insufficient documentation

## 2023-02-06 DIAGNOSIS — Z951 Presence of aortocoronary bypass graft: Secondary | ICD-10-CM | POA: Insufficient documentation

## 2023-02-06 DIAGNOSIS — G7000 Myasthenia gravis without (acute) exacerbation: Secondary | ICD-10-CM | POA: Insufficient documentation

## 2023-02-06 DIAGNOSIS — I739 Peripheral vascular disease, unspecified: Secondary | ICD-10-CM | POA: Diagnosis not present

## 2023-02-06 DIAGNOSIS — G4733 Obstructive sleep apnea (adult) (pediatric): Secondary | ICD-10-CM | POA: Insufficient documentation

## 2023-02-06 DIAGNOSIS — Z79899 Other long term (current) drug therapy: Secondary | ICD-10-CM | POA: Diagnosis not present

## 2023-02-06 DIAGNOSIS — Z7984 Long term (current) use of oral hypoglycemic drugs: Secondary | ICD-10-CM | POA: Insufficient documentation

## 2023-02-06 DIAGNOSIS — I080 Rheumatic disorders of both mitral and aortic valves: Secondary | ICD-10-CM | POA: Diagnosis not present

## 2023-02-06 DIAGNOSIS — I255 Ischemic cardiomyopathy: Secondary | ICD-10-CM | POA: Insufficient documentation

## 2023-02-06 DIAGNOSIS — Z9889 Other specified postprocedural states: Secondary | ICD-10-CM

## 2023-02-06 DIAGNOSIS — I5022 Chronic systolic (congestive) heart failure: Secondary | ICD-10-CM | POA: Insufficient documentation

## 2023-02-06 DIAGNOSIS — F17210 Nicotine dependence, cigarettes, uncomplicated: Secondary | ICD-10-CM | POA: Insufficient documentation

## 2023-02-06 DIAGNOSIS — I252 Old myocardial infarction: Secondary | ICD-10-CM | POA: Insufficient documentation

## 2023-02-06 DIAGNOSIS — I2510 Atherosclerotic heart disease of native coronary artery without angina pectoris: Secondary | ICD-10-CM | POA: Diagnosis present

## 2023-02-06 DIAGNOSIS — Z95818 Presence of other cardiac implants and grafts: Secondary | ICD-10-CM

## 2023-02-06 LAB — CBC
HCT: 41.8 % (ref 39.0–52.0)
Hemoglobin: 12.9 g/dL — ABNORMAL LOW (ref 13.0–17.0)
MCH: 22.8 pg — ABNORMAL LOW (ref 26.0–34.0)
MCHC: 30.9 g/dL (ref 30.0–36.0)
MCV: 73.9 fL — ABNORMAL LOW (ref 80.0–100.0)
Platelets: 317 10*3/uL (ref 150–400)
RBC: 5.66 MIL/uL (ref 4.22–5.81)
RDW: 21.2 % — ABNORMAL HIGH (ref 11.5–15.5)
WBC: 6.8 10*3/uL (ref 4.0–10.5)
nRBC: 0 % (ref 0.0–0.2)

## 2023-02-06 LAB — BASIC METABOLIC PANEL
Anion gap: 6 (ref 5–15)
BUN: 16 mg/dL (ref 6–20)
CO2: 26 mmol/L (ref 22–32)
Calcium: 9.1 mg/dL (ref 8.9–10.3)
Chloride: 101 mmol/L (ref 98–111)
Creatinine, Ser: 1.22 mg/dL (ref 0.61–1.24)
GFR, Estimated: >60 mL/min (ref >60)
Glucose, Bld: 91 mg/dL (ref 70–99)
Potassium: 4.9 mmol/L (ref 3.5–5.1)
Sodium: 133 mmol/L — ABNORMAL LOW (ref 135–145)

## 2023-02-06 LAB — LIPID PANEL
Cholesterol: 167 mg/dL (ref 0–200)
HDL: 55 mg/dL (ref >40)
LDL Cholesterol: 104 mg/dL — ABNORMAL HIGH (ref 0–99)
Total CHOL/HDL Ratio: 3 RATIO
Triglycerides: 40 mg/dL (ref <150)
VLDL: 8 mg/dL (ref 0–40)

## 2023-02-06 MED ORDER — ENTRESTO 24-26 MG PO TABS: 1.0000 | ORAL_TABLET | Freq: Two times a day (BID) | ORAL | 2 refills | Status: DC

## 2023-02-06 MED ORDER — METOPROLOL SUCCINATE ER 25 MG PO TB24: ORAL_TABLET | ORAL | 2 refills | Status: DC

## 2023-02-06 NOTE — Patient Instructions (Signed)
Toprol xl 25 mg Twice daily  Entresto 24/26 mg Twice daily  Your physician has requested that you have an echocardiogram. Echocardiography is a painless test that uses sound waves to create images of your heart. It provides your doctor with information about the size and shape of your heart and how well your heart's chambers and valves are working. This procedure takes approximately one hour. There are no restrictions for this procedure. Please do NOT wear cologne, perfume, aftershave, or lotions (deodorant is allowed). Please arrive 15 minutes prior to your appointment time.  Labs done today, your results will be available in MyChart, we will contact you for abnormal readings.  Your physician recommends that you schedule a follow-up appointment in: 2 months(June) Call in may to schedule an appointment  If you have any questions or concerns before your next appointment please send [place] a message through [place] or call our office at [phone].    TO LEAVE A MESSAGE FOR THE NURSE SELECT OPTION 2, PLEASE LEAVE A MESSAGE INCLUDING: YOUR NAME DATE OF BIRTH CALL BACK NUMBER REASON FOR CALL**this is important as we prioritize the call backs  YOU WILL RECEIVE A CALL BACK THE SAME DAY AS LONG AS YOU CALL BEFORE 4:00 PM  At the [facility], you and your health needs are our priority. As part of our continuing mission to provide you with exceptional heart care, we have created designated Provider Care Teams. These Care Teams include your primary Cardiologist (physician) and Advanced Practice Providers (APPs- Physician Assistants and Nurse Practitioners) who all work together to provide you with the care you need, when you need it.   You may see any of the following providers on your designated Care Team at your next follow up: Dr Arvilla Meres Dr Marca Ancona Dr. Marcos Eke, NP Robbie Lis, [place] [facility] [place], [place] Brynda Peon,  NP Karle Plumber, PharmD   Please be sure to bring in all your medications bottles to every appointment.    Thank you for choosing [facility]

## 2023-02-08 NOTE — Progress Notes (Signed)
PCP: Verl Bangs, MD Cardiology: Dr. Tresa Endo HF Cardiology: Dr. Shirlee Latch  57 y.o. with history of CAD, ischemic cardiomyopathy, mitral regurgitation, PAD, smoking, polysubstance abuse was referred by Dr. Excell Seltzer for CHF clinic evaluation as part of Mitraclip workup.  Patient had left femoral endarterectomy in 2016 and later left femoral artery bypass in 2017 at [facility].  In 2019, he had anterior STEMI complicated by cardiac arrest.  EF by echo at this time was 25-30%.  Hospitalization was complicated by GI bleeding and AKI requiring temporary HD.  He had DES to proximal LAD. In 2/20, he had recurrent anterior STEMI with totally occluded proximal LAD treated with aspiration thrombectomy and PCI.    He additionally has myasthenia gravis and had a thymectomy in the past (required sternotomy).    He has a history of cocaine, amphetamine, and marijuana abuse (none x 2 years). He is an active smoker.   TTE showed significant MR recently, so TEE was done in 5/22. This showed LV EF 25-30%, moderate RV enlargement with mildly decreased RV systolic function, severe MR with ERO 0.65.  Right and left heart cath in 5/22 showed elevated PCWP with prominent V waves, preserved cardiac output, patent LAD stents.    Patient had Mitraclip placement in 7/22.  Echo in 8/22 showed EF 34%, mild LV dilation, normal RV, s/p Mitraclip with mean gradient 7 mmHg and mild MR, mild-moderate AI, mild AS with mean gradient 12 mmHg.   Patient returns for followup of CHF.  I have not seen him since 2022.  After my last appointment with him, he was referred to EP for ICD but looks like he never went.  Symptomatically seems to be doing well.  Plays a lot of golf.  Has a 76-year-old who keeps him busy.  He has a little fatigue on some days but generally no significant exertional dyspnea and no chest pain.  No lightheadedness or palpitations. Still smoking 1 ppd.   Labs (5/22): K 3.9, creatinine 1.0.  Labs (8/22): K 4.5,  creatinine 1.13 Labs (10/22): K 4.3, creatinine 1.07, BNP 203  ECG (personally reviewed): NSR, old anterior MI  PMH: 1. CAD: Anterior STEMI 2019.  Totally occluded proximal LAD treated with DES.  - Recurrent anterior STEMI 2/20 with proximal LAD occluded, patient had aspiration thrombectomy and repeat PCI.   - LHC (5/22): Patent LAD stents, nonobstructive disease.  2. PAD: H/o left femoral endarterectomy 2016 and later femoral artery bypass 2017.  3. COPD: Active smoker.  4. Myasthenia gravis s/p thymectomy.  5. OSA: Unable to tolerate CPAP.  6. Chronic systolic CHF: Ischemic cardiomyopathy.  - RHC (5/22) with mean RA 5, PA 55/23 mean 37, mean PCWP 25 with v waves to 42, CI 2.3 - TEE (5/22): LV EF 25-30%, moderate RV enlargement with mildly decreased RV systolic function, severe MR with ERO 0.65.  - Echo (8/22): EF 34%, mild LV dilation, normal RV, s/p Mitraclip with mean gradient 7 mmHg and mild MR, mild-moderate AI, mild AS with mean gradient 12 mmHg.  7. Mitral regurgitation: See 5/22 TEE above.  Suspect infarct-related MR.  - Mitraclip 7/22 8. Polysubstance abuse: Cocaine, marijuana, amphetamines.  He has been off all of these for 2 years.  9. Hypertension 10. Hyperlipidemia 11. Aortic stenosis: Mild on 8/22 echo.   Social History   Socioeconomic History   Marital status: Divorced    Spouse name: Not on file   Number of children: Not on file   Years of education: Not on file  Highest education level: Not on file  Occupational History    Employer: [employer]  Tobacco Use   Smoking status: Every Day    Packs/day: .5    Types: Cigarettes   Smokeless tobacco: Current  Vaping Use   Vaping Use: Never used  Substance and Sexual Activity   Alcohol use: Yes    Comment: none in past several weeks, but normally about 5 beers a week, endorses binge drinking at celebrations   Drug use: Yes    Types: Cocaine, Marijuana, Methamphetamines, Benzodiazepines    Comment:  vaguely discusses useage, but quickly changes subject   Sexual activity: Yes    Partners: Female  Other Topics Concern   Not on file  Social History Narrative   Divorced   Employed at [employer]   Polysubstance abuse   Social Determinants of Health   Financial Resource Strain: Not on file  Food Insecurity: Not on file  Transportation Needs: Not on file  Physical Activity: Not on file  Stress: Not on file  Social Connections: Unknown (12/19/2018)   Social Connection and Isolation Panel [NHANES]    Frequency of Communication with Friends and Family: Patient declined    Frequency of Social Gatherings with Friends and Family: Patient declined    Attends Religious Services: Patient declined    Database administrator or Organizations: Patient declined    Attends Banker Meetings: Patient declined    Marital Status: Patient declined  Intimate Partner Violence: Not At Risk (12/19/2018)   Humiliation, Afraid, Rape, and Kick questionnaire    Fear of Current or Ex-Partner: No    Emotionally Abused: No    Physically Abused: No    Sexually Abused: No   Family History  Problem Relation Age of Onset   Alzheimer's disease Father    ROS: All systems reviewed and negative except as per HPI.   Current Outpatient Medications  Medication Sig Dispense Refill   atorvastatin (LIPITOR) 40 MG tablet TAKE 1 TABLET BY MOUTH DAILY, APPOINTMENT NEEDED FOR CONTINUATION OF REFILLS, 1ST ATTEMPT 90 tablet 0   clopidogrel (PLAVIX) 75 MG tablet TAKE 1 TABLET(75 MG) BY MOUTH DAILY 90 tablet 0   JARDIANCE 10 MG TABS tablet TAKE 1 TABLET(10 MG) BY MOUTH DAILY BEFORE BREAKFAST 30 tablet 0   loratadine (CLARITIN) 10 MG tablet Take 10 mg by mouth daily as needed for allergies.     nitroGLYCERIN (NITROSTAT) 0.4 MG SL tablet Place 1 tablet (0.4 mg total) under the tongue every 5 (five) minutes as needed for chest pain (max 3 doses). 10 tablet 0   pantoprazole (PROTONIX) 40 MG tablet TAKE 1  TABLET(40 MG) BY MOUTH DAILY 90 tablet 0   Sodium Chloride-Sodium Bicarb (NETI POT SINUS WASH NA) Place 1 application into the nose once a week.     spironolactone (ALDACTONE) 25 MG tablet Take 1 tablet (25 mg total) by mouth daily. 90 tablet 0   metoprolol succinate (TOPROL-XL) 25 MG 24 hr tablet TAKE 1 TABLET(25 MG) BY MOUTH IN THE MORNING AND AT BEDTIME 180 tablet 2   sacubitril-valsartan (ENTRESTO) 24-26 MG Take 1 tablet by mouth 2 (two) times daily. 180 tablet 2   No current facility-administered medications for this encounter.   BP 130/70   Pulse 90   Wt 77 kg (169 lb 12.8 oz)   SpO2 98%   BMI 25.08 kg/m  General: NAD Neck: No JVD, no thyromegaly or thyroid nodule.  Lungs: Distant BS CV: Nondisplaced PMI.  Heart regular S1/S2, no S3/S4, 2/6 SEM RUSB with clear S2  No peripheral edema.  No carotid bruit.  Normal pedal pulses.  Abdomen: Soft, nontender, no hepatosplenomegaly, no distention.  Skin: Intact without lesions or rashes.  Neurologic: Alert and oriented x 3.  Psych: Normal affect. Extremities: No clubbing or cyanosis.  HEENT: Normal.   Assessment/Plan: 1. Chronic systolic CHF: Ischemic cardiomyopathy.  TEE in 5/22 with LV EF 25-30%, moderate RV enlargement with mildly decreased RV systolic function, severe MR with ERO 0.65.  RHC in 5/22 showed preserved cardiac output.  RA pressure was normal but PCWP was elevated with prominent v-waves; consistent with severe mitral regurgitation.  He is now s/p Mitraclip.  Echo in 8/22 showed EF 34%, mild LV dilation, normal RV, s/p Mitraclip with mean gradient 7 mmHg and mild MR, mild-moderate AI, mild AS with mean gradient 12 mmHg. On exam today, not volume overloaded with NYHA class II symptoms.   - Increase Toprol XL back to 25 mg bid.   - Increase Entresto to 24/26 bid rather than qd.   - Continue spironolactone 25 mg daily.  BMET/BNP today.  - He does not appear to need a diuretic.  - Continue Jardiance 10 mg daily.  - I will  arrange for repeat echo; if EF still < 35%, I would again recommend ICD.  Narrow QRS so not a CRT candidate.  2. CAD: Anterior STEMI in 2019 and again in 2/20.  LHC 5/22 with patent LAD stents. No chest pain.  - Continue Plavix.  - Continue atorvastatin 40 mg daily. Check lipids today.  3. OSA: Has not tolerate CPAP.  4. Mitral regurgitation: S/p Mitraclip 7/22.  Echo in 8/22 with mild residual MR, mean gradient 7 mmHg.  - Repeat echo.  5. Active smoking: I strongly encouraged him to quit.  - He is willing to try Chantix, will prescribe. 6. PAD: Continue statin, Plavix.  No claudication.  7. Aortic valve disorder: Mild-moderate AI and mild AS on 2022 echo.  - I will arrange for repeat echo as above.   He will followup in 2 months.   Marca Ancona 02/08/2023

## 2023-02-10 ENCOUNTER — Encounter (HOSPITAL_COMMUNITY): Payer: Self-pay

## 2023-02-10 ENCOUNTER — Other Ambulatory Visit (HOSPITAL_COMMUNITY): Payer: Self-pay

## 2023-02-10 MED ORDER — ROSUVASTATIN CALCIUM 40 MG PO TABS: 40.0000 mg | ORAL_TABLET | Freq: Every day | ORAL | 2 refills | Status: DC

## 2023-02-25 ENCOUNTER — Ambulatory Visit (HOSPITAL_COMMUNITY)
Admission: RE | Admit: 2023-02-25 | Discharge: 2023-02-25 | Disposition: A | Discharge status: Home / Self Care | Payer: BC Managed Care – PPO | Source: Ambulatory Visit | Attending: Cardiology | Admitting: Cardiology

## 2023-02-25 ENCOUNTER — Other Ambulatory Visit (HOSPITAL_COMMUNITY): Payer: Self-pay | Admitting: Cardiology

## 2023-02-25 DIAGNOSIS — I110 Hypertensive heart disease with heart failure: Secondary | ICD-10-CM | POA: Insufficient documentation

## 2023-02-25 DIAGNOSIS — I5022 Chronic systolic (congestive) heart failure: Secondary | ICD-10-CM | POA: Diagnosis not present

## 2023-02-25 DIAGNOSIS — I509 Heart failure, unspecified: Secondary | ICD-10-CM | POA: Diagnosis present

## 2023-02-25 DIAGNOSIS — G4730 Sleep apnea, unspecified: Secondary | ICD-10-CM | POA: Diagnosis not present

## 2023-02-25 DIAGNOSIS — J449 Chronic obstructive pulmonary disease, unspecified: Secondary | ICD-10-CM | POA: Insufficient documentation

## 2023-02-25 DIAGNOSIS — I2510 Atherosclerotic heart disease of native coronary artery without angina pectoris: Secondary | ICD-10-CM | POA: Diagnosis not present

## 2023-02-25 DIAGNOSIS — I080 Rheumatic disorders of both mitral and aortic valves: Secondary | ICD-10-CM | POA: Diagnosis not present

## 2023-02-25 DIAGNOSIS — E785 Hyperlipidemia, unspecified: Secondary | ICD-10-CM | POA: Insufficient documentation

## 2023-02-25 LAB — ECHOCARDIOGRAM COMPLETE
AR max vel: 2.41 cm2
AV Area VTI: 2.09 cm2
AV Area mean vel: 2.22 cm2
AV Mean grad: 16 mmHg
AV Peak grad: 25.2 mmHg
Ao pk vel: 2.51 m/s
Area-P 1/2: 1.65 cm2
Calc EF: 37.5 %
MV M vel: 4.47 m/s
MV Peak grad: 79.7 mmHg
MV VTI: 1.83 cm2
P 1/2 time: 545 msec
Radius: 0.6 cm
S' Lateral: 3.9 cm
Single Plane A2C EF: 39.2 %
Single Plane A4C EF: 38.7 %

## 2023-03-25 ENCOUNTER — Other Ambulatory Visit (HOSPITAL_COMMUNITY): Payer: Self-pay | Admitting: Cardiology

## 2023-03-30 ENCOUNTER — Other Ambulatory Visit (HOSPITAL_COMMUNITY): Payer: Self-pay | Admitting: Cardiology

## 2023-04-04 ENCOUNTER — Other Ambulatory Visit (HOSPITAL_COMMUNITY): Payer: Self-pay | Admitting: Cardiology

## 2023-04-27 ENCOUNTER — Other Ambulatory Visit (HOSPITAL_COMMUNITY): Payer: Self-pay | Admitting: Cardiology

## 2023-06-15 ENCOUNTER — Other Ambulatory Visit (HOSPITAL_COMMUNITY): Payer: Self-pay | Admitting: Cardiology

## 2023-06-15 MED ORDER — ENTRESTO 24-26 MG PO TABS: 1.0000 | ORAL_TABLET | Freq: Two times a day (BID) | ORAL | 2 refills | Status: AC

## 2023-06-15 MED ORDER — ROSUVASTATIN CALCIUM 40 MG PO TABS: 40.0000 mg | ORAL_TABLET | Freq: Every day | ORAL | 3 refills | Status: DC

## 2023-06-15 NOTE — Addendum Note (Signed)
Addended by: Theresia Bough on: 06/15/2023 04:38 PM   Modules accepted: Orders

## 2023-06-30 ENCOUNTER — Telehealth: Payer: Self-pay | Admitting: Pharmacy Technician

## 2023-06-30 ENCOUNTER — Other Ambulatory Visit: Payer: Self-pay | Admitting: *Deleted

## 2023-06-30 ENCOUNTER — Other Ambulatory Visit (HOSPITAL_COMMUNITY): Payer: Self-pay

## 2023-06-30 MED ORDER — PANTOPRAZOLE SODIUM 40 MG PO TBEC: 40.0000 mg | DELAYED_RELEASE_TABLET | Freq: Every day | ORAL | 2 refills | Status: DC

## 2023-06-30 NOTE — Telephone Encounter (Signed)
Pharmacy Patient Advocate Encounter   Received notification from CoverMyMeds that prior authorization for pantoprazole is required/requested.   Insurance verification completed.   The patient is insured through [facility] .   Per test claim: PA required; PA submitted to [employer] via CoverMyMeds Key/confirmation #/EOC [facility] Status is pending

## 2023-06-30 NOTE — Telephone Encounter (Signed)
Refilled per Dr Royann Shivers

## 2023-06-30 NOTE — Telephone Encounter (Signed)
Pharmacy Patient Advocate Encounter  Received notification from [facility] that Prior Authorization for pantoprazole has been APPROVED from 06/30/23 to 06/29/24. Ran test claim, Copay is $3.77. This test claim was processed through [facility]- copay amounts may vary at other pharmacies due to pharmacy/plan contracts, or as the patient moves through the different stages of their insurance plan.   PA #/Case ID/Reference #: [id number]

## 2023-06-30 NOTE — Telephone Encounter (Signed)
Pharmacy Patient Advocate Encounter   Received notification from CoverMyMeds that prior authorization for pantoprazole is required/requested.   Insurance verification completed.   The patient is insured through [facility] .   Per test claim: PA required; PA started via CoverMyMeds. KEY [id number] . Waiting for clinical questions to populate.

## 2023-07-02 ENCOUNTER — Other Ambulatory Visit (HOSPITAL_COMMUNITY): Payer: Self-pay

## 2023-07-02 MED ORDER — CLOPIDOGREL BISULFATE 75 MG PO TABS: ORAL_TABLET | ORAL | 3 refills | Status: DC

## 2023-09-28 ENCOUNTER — Other Ambulatory Visit (HOSPITAL_COMMUNITY): Payer: Self-pay | Admitting: Cardiology

## 2024-01-03 ENCOUNTER — Other Ambulatory Visit (HOSPITAL_COMMUNITY): Payer: Self-pay | Admitting: Cardiology

## 2024-01-11 ENCOUNTER — Telehealth (HOSPITAL_COMMUNITY): Payer: Self-pay

## 2024-01-11 DIAGNOSIS — I5022 Chronic systolic (congestive) heart failure: Secondary | ICD-10-CM

## 2024-01-11 MED ORDER — METOPROLOL SUCCINATE ER 25 MG PO TB24
ORAL_TABLET | ORAL | 0 refills | Status: AC
Start: 2024-01-11 — End: ?

## 2024-01-11 NOTE — Telephone Encounter (Signed)
 Patient's Metoprolol ER Succinate medication has been sent to pt's pharmacy.

## 2024-03-07 ENCOUNTER — Other Ambulatory Visit (HOSPITAL_COMMUNITY): Payer: Self-pay | Admitting: Cardiology

## 2024-03-09 MED ORDER — ROSUVASTATIN CALCIUM 40 MG PO TABS: 40.0000 mg | ORAL_TABLET | Freq: Every day | ORAL | 3 refills | Status: AC

## 2024-04-04 ENCOUNTER — Other Ambulatory Visit (HOSPITAL_COMMUNITY): Payer: Self-pay | Admitting: Cardiology

## 2024-07-04 ENCOUNTER — Other Ambulatory Visit (HOSPITAL_COMMUNITY): Payer: Self-pay | Admitting: Cardiology

## 2024-07-09 ENCOUNTER — Other Ambulatory Visit (HOSPITAL_COMMUNITY): Payer: Self-pay | Admitting: Cardiology

## 2024-08-02 ENCOUNTER — Other Ambulatory Visit: Payer: Self-pay | Admitting: Cardiovascular Disease

## 2024-08-04 NOTE — Telephone Encounter (Signed)
 This is a CHF pt, Dr. Burnard is no longer with our practice.

## 2024-08-22 ENCOUNTER — Other Ambulatory Visit (HOSPITAL_COMMUNITY): Payer: Self-pay | Admitting: Cardiology

## 2024-09-19 ENCOUNTER — Telehealth (HOSPITAL_COMMUNITY): Payer: Self-pay

## 2024-09-19 NOTE — Telephone Encounter (Signed)
 Called to confirm/remind patient of their appointment at [facility] on 09/20/24 2:00.   Appointment:   [] Confirmed  [] Left mess   [] No answer/No voice mail  [x] VM Full/unable to leave message  [] Phone not in service  Patient reminded to bring all medications and/or complete list.  Confirmed patient has transportation. Gave directions, instructed to utilize valet parking.

## 2024-09-20 ENCOUNTER — Ambulatory Visit (HOSPITAL_COMMUNITY)

## 2024-09-20 NOTE — Progress Notes (Incomplete)
 PCP: Radiontchenko, Alexei, MD Cardiology: Dr. Burnard HF Cardiology: Dr. Rolan  58 y.o. with history of CAD, ischemic cardiomyopathy, mitral regurgitation, PAD, smoking, polysubstance abuse was referred by Dr. Wonda for CHF clinic evaluation as part of Mitraclip workup.  Patient had left femoral endarterectomy in 2016 and later left femoral artery bypass in 2017 at [facility].  In 2019, he had anterior STEMI complicated by cardiac arrest.  EF by echo at this time was 25-30%.  Hospitalization was complicated by GI bleeding and AKI requiring temporary HD.  He had DES to proximal LAD. In 2/20, he had recurrent anterior STEMI with totally occluded proximal LAD treated with aspiration thrombectomy and PCI.    He additionally has myasthenia gravis and had a thymectomy in the past (required sternotomy).    He has a history of cocaine, amphetamine, and marijuana abuse (none x 2 years). He is an active smoker.   TTE showed significant MR recently, so TEE was done in 5/22. This showed LV EF 25-30%, moderate RV enlargement with mildly decreased RV systolic function, severe MR with ERO 0.65.  Right and left heart cath in 5/22 showed elevated PCWP with prominent V waves, preserved cardiac output, patent LAD stents.    Patient had Mitraclip placement in 7/22.  Echo in 8/22 showed EF 34%, mild LV dilation, normal RV, s/p Mitraclip with mean gradient 7 mmHg and mild MR, mild-moderate AI, mild AS with mean gradient 12 mmHg.   4/24: Patient returns for followup of CHF.  I have not seen him since 2022.  After my last appointment with him, he was referred to EP for ICD but looks like he never went.  Symptomatically seems to be doing well.  Plays a lot of golf.  Has a 65-year-old who keeps him busy.  He has a little fatigue on some days but generally no significant exertional dyspnea and no chest pain.  No lightheadedness or palpitations. Still smoking 1 ppd.   He returns today for heart failure follow up. Overall  feeling ***. NYHA ***. Reports {Symptoms; cardiac:12860::dyspnea,fatigue}. Denies {Symptoms; cardiac:12860::chest pain,dyspnea,fatigue,near-syncope,orthopnea,palpitations,dizziness,abnormal bleeding}. Able to perform ADLs. Appetite okay. Weight at home ***. BP at home***. Compliant with all medications.   Labs (5/22): K 3.9, creatinine 1.0.  Labs (8/22): K 4.5, creatinine 1.13 Labs (10/22): K 4.3, creatinine 1.07, BNP 203  PMH: 1. CAD: Anterior STEMI 2019.  Totally occluded proximal LAD treated with DES.  - Recurrent anterior STEMI 2/20 with proximal LAD occluded, patient had aspiration thrombectomy and repeat PCI.   - LHC (5/22): Patent LAD stents, nonobstructive disease.  2. PAD: H/o left femoral endarterectomy 2016 and later femoral artery bypass 2017.  3. COPD: Active smoker.  4. Myasthenia gravis s/p thymectomy.  5. OSA: Unable to tolerate CPAP.  6. Chronic systolic CHF: Ischemic cardiomyopathy.  - RHC (5/22) with mean RA 5, PA 55/23 mean 37, mean PCWP 25 with v waves to 42, CI 2.3 - TEE (5/22): LV EF 25-30%, moderate RV enlargement with mildly decreased RV systolic function, severe MR with ERO 0.65.  - Echo (8/22): EF 34%, mild LV dilation, normal RV, s/p Mitraclip with mean gradient 7 mmHg and mild MR, mild-moderate AI, mild AS with mean gradient 12 mmHg.  7. Mitral regurgitation: See 5/22 TEE above.  Suspect infarct-related MR.  - Mitraclip 7/22 8. Polysubstance abuse: Cocaine, marijuana, amphetamines.  He has been off all of these for 2 years.  9. Hypertension 10. Hyperlipidemia 11. Aortic stenosis: Mild on 8/22 echo.   Social History  Socioeconomic History   Marital status: Divorced    Spouse name: Not on file   Number of children: Not on file   Years of education: Not on file   Highest education level: Not on file  Occupational History    Employer: [employer]  Tobacco Use   Smoking status: Every Day    Current packs/day: 0.50     Types: Cigarettes   Smokeless tobacco: Current  Vaping Use   Vaping status: Never Used  Substance and Sexual Activity   Alcohol use: Yes    Comment: none in past several weeks, but normally about 5 beers a week, endorses binge drinking at celebrations   Drug use: Yes    Types: Cocaine, Marijuana, Methamphetamines, Benzodiazepines    Comment: vaguely discusses useage, but quickly changes subject   Sexual activity: Yes    Partners: Female  Other Topics Concern   Not on file  Social History Narrative   Divorced   Employed at [employer]   Polysubstance abuse   Social Drivers of Corporate Investment Banker Strain: Not on file  Food Insecurity: Not on file  Transportation Needs: Not on file  Physical Activity: Not on file  Stress: Not on file  Social Connections: Unknown (12/19/2018)   Social Connection and Isolation Panel    Frequency of Communication with Friends and Family: Patient declined    Frequency of Social Gatherings with Friends and Family: Patient declined    Attends Religious Services: Patient declined    Database Administrator or Organizations: Patient declined    Attends Banker Meetings: Patient declined    Marital Status: Patient declined  Intimate Partner Violence: Not At Risk (12/19/2018)   Humiliation, Afraid, Rape, and Kick questionnaire    Fear of Current or Ex-Partner: No    Emotionally Abused: No    Physically Abused: No    Sexually Abused: No   Family History  Problem Relation Age of Onset   Alzheimer's disease Father    ROS: All systems reviewed and negative except as per HPI.   Current Outpatient Medications  Medication Sig Dispense Refill   clopidogrel  (PLAVIX ) 75 MG tablet TAKE 1 TABLET(75 MG) BY MOUTH DAILY PLEASE SCHEDULE APPOINTMENT FOR MORE REFILLS 90 tablet 1   JARDIANCE  10 MG TABS tablet TAKE 1 TABLET(10 MG) BY MOUTH DAILY BEFORE BREAKFAST 30 tablet 11   loratadine  (CLARITIN ) 10 MG tablet Take 10 mg by mouth  daily as needed for allergies.     metoprolol  succinate (TOPROL -XL) 25 MG 24 hr tablet TAKE 1 TABLET(25 MG) BY MOUTH IN THE MORNING AND AT BEDTIME 60 tablet 0   nitroGLYCERIN  (NITROSTAT ) 0.4 MG SL tablet Place 1 tablet (0.4 mg total) under the tongue every 5 (five) minutes as needed for chest pain (max 3 doses). 10 tablet 0   pantoprazole  (PROTONIX ) 40 MG tablet TAKE 1 TABLET(40 MG) BY MOUTH DAILY 90 tablet 0   rosuvastatin  (CRESTOR ) 40 MG tablet Take 1 tablet (40 mg total) by mouth daily. 90 tablet 3   sacubitril -valsartan  (ENTRESTO ) 24-26 MG Take 1 tablet by mouth 2 (two) times daily. 180 tablet 2   Sodium Chloride -Sodium Bicarb (NETI POT SINUS WASH NA) Place 1 application into the nose once a week.     spironolactone  (ALDACTONE ) 25 MG tablet Take 1 tablet (25 mg total) by mouth daily. PLEASE KEEP SCHEDULED APPOINTMENT FOR MORE REFILLS 60 tablet 0   No current facility-administered medications for this visit.   There  were no vitals taken for this visit.  There were no vitals filed for this visit.  Physical Exam: General: *** appearing. No distress  Cardiac: JVP ~*** cm. No murmurs  Resp: Lung sounds clear and equal B/L Abdomen: Soft, non-distended.  Extremities: Warm and dry.  *** edema.  Neuro: A&O x3. Affect pleasant.   ECG (personally reviewed): ***  Assessment/Plan: 1. Chronic systolic CHF: Ischemic cardiomyopathy.  TEE in 5/22 with LV EF 25-30%, moderate RV enlargement with mildly decreased RV systolic function, severe MR with ERO 0.65.  RHC in 5/22 showed preserved cardiac output.  RA pressure was normal but PCWP was elevated with prominent v-waves; consistent with severe mitral regurgitation.  He is now s/p Mitraclip.  Echo in 8/22 showed EF 34%, mild LV dilation, normal RV, s/p Mitraclip with mean gradient 7 mmHg and mild MR, mild-moderate AI, mild AS with mean gradient 12 mmHg.  - On exam today, not volume overloaded with NYHA class II symptoms.   - Increase Toprol  XL back to  25 mg bid.   - Increase Entresto  to 24/26 bid rather than qd.   - Continue spironolactone  25 mg daily.  BMET/BNP today.  - He does not appear to need a diuretic.  - Continue Jardiance  10 mg daily.  - I will arrange for repeat echo; if EF still < 35%, I would again recommend ICD.  Narrow QRS so not a CRT candidate.   2. CAD: Anterior STEMI in 2019 and again in 2/20.  LHC 5/22 with patent LAD stents. No chest pain.  - Continue Plavix .  - Continue atorvastatin  40 mg daily. Check lipids today.   3. OSA: Has not tolerate CPAP.  4. Mitral regurgitation: S/p Mitraclip 7/22.  Echo in 8/22 with mild residual MR, mean gradient 7 mmHg.  - Repeat echo.  5. Active smoking: I strongly encouraged him to quit.  - He is willing to try Chantix, will prescribe. 6. PAD: Continue statin, Plavix .  No claudication.  7. Aortic valve disorder: Mild-moderate AI and mild AS on 2022 echo.  - I will arrange for repeat echo as above.   He will followup in 2 months.   Follow up in *** with ***  Kawika Bischoff, NP 09/20/2024

## 2024-10-12 ENCOUNTER — Other Ambulatory Visit (HOSPITAL_COMMUNITY): Payer: Self-pay | Admitting: Cardiology

## 2024-10-31 ENCOUNTER — Other Ambulatory Visit: Payer: Self-pay | Admitting: Internal Medicine
